# Patient Record
Sex: Male | Born: 1955
Health system: Southern US, Community
[De-identification: ages and names within clinical notes are randomized; demographics above are authoritative.]

## PROBLEM LIST (undated history)

## (undated) DIAGNOSIS — K635 Polyp of colon: Secondary | ICD-10-CM

## (undated) DIAGNOSIS — E7800 Pure hypercholesterolemia, unspecified: Secondary | ICD-10-CM

## (undated) DIAGNOSIS — H269 Unspecified cataract: Secondary | ICD-10-CM

## (undated) DIAGNOSIS — F419 Anxiety disorder, unspecified: Secondary | ICD-10-CM

## (undated) DIAGNOSIS — K219 Gastro-esophageal reflux disease without esophagitis: Secondary | ICD-10-CM

## (undated) DIAGNOSIS — I10 Essential (primary) hypertension: Secondary | ICD-10-CM

## (undated) DIAGNOSIS — M810 Age-related osteoporosis without current pathological fracture: Secondary | ICD-10-CM

## (undated) DIAGNOSIS — S2249XA Multiple fractures of ribs, unspecified side, initial encounter for closed fracture: Secondary | ICD-10-CM

## (undated) DIAGNOSIS — E039 Hypothyroidism, unspecified: Secondary | ICD-10-CM

## (undated) DIAGNOSIS — F1910 Other psychoactive substance abuse, uncomplicated: Secondary | ICD-10-CM

## (undated) DIAGNOSIS — J439 Emphysema, unspecified: Secondary | ICD-10-CM

## (undated) DIAGNOSIS — E119 Type 2 diabetes mellitus without complications: Secondary | ICD-10-CM

## (undated) DIAGNOSIS — R590 Localized enlarged lymph nodes: Secondary | ICD-10-CM

## (undated) DIAGNOSIS — M1990 Unspecified osteoarthritis, unspecified site: Secondary | ICD-10-CM

## (undated) DIAGNOSIS — J449 Chronic obstructive pulmonary disease, unspecified: Secondary | ICD-10-CM

## (undated) HISTORY — PX: FIXATION KYPHOPLASTY LUMBAR SPINE: SHX1642

## (undated) HISTORY — DX: Gastro-esophageal reflux disease without esophagitis: K21.9

## (undated) HISTORY — DX: Essential (primary) hypertension: I10

## (undated) HISTORY — DX: Type 2 diabetes mellitus without complications: E11.9

## (undated) HISTORY — DX: Polyp of colon: K63.5

## (undated) HISTORY — PX: BACK SURGERY: SHX140

## (undated) HISTORY — PX: OTHER SURGICAL HISTORY: SHX169

## (undated) HISTORY — DX: Localized enlarged lymph nodes: R59.0

## (undated) HISTORY — DX: Pure hypercholesterolemia, unspecified: E78.00

## (undated) HISTORY — DX: Emphysema, unspecified: J43.9

## (undated) HISTORY — DX: Chronic obstructive pulmonary disease, unspecified: J44.9

## (undated) HISTORY — PX: WISDOM TOOTH EXTRACTION: SHX21

## (undated) HISTORY — DX: Age-related osteoporosis without current pathological fracture: M81.0

## (undated) HISTORY — DX: Anxiety disorder, unspecified: F41.9

## (undated) HISTORY — DX: Unspecified cataract: H26.9

## (undated) HISTORY — DX: Other psychoactive substance abuse, uncomplicated: F19.10

---

## 2002-09-10 ENCOUNTER — Encounter: Payer: Self-pay | Admitting: Orthopedic Surgery

## 2002-09-10 ENCOUNTER — Encounter: Admission: RE | Admit: 2002-09-10 | Discharge: 2002-09-10 | Payer: Self-pay | Admitting: Orthopedic Surgery

## 2007-01-20 ENCOUNTER — Ambulatory Visit (HOSPITAL_COMMUNITY): Admission: RE | Admit: 2007-01-20 | Discharge: 2007-01-20 | Payer: Self-pay | Admitting: Physician Assistant

## 2007-01-20 DIAGNOSIS — D229 Melanocytic nevi, unspecified: Secondary | ICD-10-CM

## 2007-01-20 HISTORY — DX: Melanocytic nevi, unspecified: D22.9

## 2007-12-23 ENCOUNTER — Ambulatory Visit (HOSPITAL_COMMUNITY): Admission: RE | Admit: 2007-12-23 | Discharge: 2007-12-23 | Payer: Self-pay | Admitting: Family Medicine

## 2008-06-29 ENCOUNTER — Encounter: Admission: RE | Admit: 2008-06-29 | Discharge: 2008-06-29 | Payer: Self-pay | Admitting: Family Medicine

## 2008-07-13 ENCOUNTER — Ambulatory Visit (HOSPITAL_COMMUNITY): Admission: RE | Admit: 2008-07-13 | Discharge: 2008-07-13 | Payer: Self-pay | Admitting: Family Medicine

## 2010-01-14 ENCOUNTER — Encounter: Admission: RE | Admit: 2010-01-14 | Discharge: 2010-01-14 | Payer: Self-pay | Admitting: Family Medicine

## 2010-04-07 ENCOUNTER — Encounter: Admission: RE | Admit: 2010-04-07 | Discharge: 2010-04-07 | Payer: Self-pay | Admitting: Family Medicine

## 2010-05-14 HISTORY — PX: KNEE ARTHROSCOPY: SHX127

## 2010-06-21 ENCOUNTER — Ambulatory Visit: Payer: 59 | Attending: Orthopedic Surgery | Admitting: Physical Therapy

## 2010-06-21 DIAGNOSIS — M6281 Muscle weakness (generalized): Secondary | ICD-10-CM | POA: Insufficient documentation

## 2010-06-21 DIAGNOSIS — R5381 Other malaise: Secondary | ICD-10-CM | POA: Insufficient documentation

## 2010-06-21 DIAGNOSIS — IMO0001 Reserved for inherently not codable concepts without codable children: Secondary | ICD-10-CM | POA: Insufficient documentation

## 2010-06-21 DIAGNOSIS — M2550 Pain in unspecified joint: Secondary | ICD-10-CM | POA: Insufficient documentation

## 2010-06-21 DIAGNOSIS — R262 Difficulty in walking, not elsewhere classified: Secondary | ICD-10-CM | POA: Insufficient documentation

## 2010-06-27 ENCOUNTER — Ambulatory Visit: Payer: 59 | Admitting: *Deleted

## 2010-06-29 ENCOUNTER — Ambulatory Visit: Payer: 59 | Admitting: *Deleted

## 2010-07-04 ENCOUNTER — Ambulatory Visit: Payer: 59 | Admitting: *Deleted

## 2010-07-07 ENCOUNTER — Ambulatory Visit: Payer: 59 | Admitting: Physical Therapy

## 2010-07-11 ENCOUNTER — Ambulatory Visit: Payer: 59 | Admitting: Physical Therapy

## 2010-07-13 ENCOUNTER — Ambulatory Visit: Payer: 59 | Attending: Orthopedic Surgery | Admitting: Physical Therapy

## 2010-07-13 DIAGNOSIS — IMO0001 Reserved for inherently not codable concepts without codable children: Secondary | ICD-10-CM | POA: Insufficient documentation

## 2010-07-13 DIAGNOSIS — M2550 Pain in unspecified joint: Secondary | ICD-10-CM | POA: Insufficient documentation

## 2010-07-13 DIAGNOSIS — R262 Difficulty in walking, not elsewhere classified: Secondary | ICD-10-CM | POA: Insufficient documentation

## 2010-07-13 DIAGNOSIS — R5381 Other malaise: Secondary | ICD-10-CM | POA: Insufficient documentation

## 2010-07-13 DIAGNOSIS — M6281 Muscle weakness (generalized): Secondary | ICD-10-CM | POA: Insufficient documentation

## 2010-07-18 ENCOUNTER — Ambulatory Visit: Payer: 59 | Admitting: *Deleted

## 2010-07-20 ENCOUNTER — Ambulatory Visit: Payer: 59 | Admitting: *Deleted

## 2010-07-27 ENCOUNTER — Ambulatory Visit: Payer: 59 | Admitting: Physical Therapy

## 2010-08-01 ENCOUNTER — Ambulatory Visit: Payer: 59 | Admitting: Physical Therapy

## 2010-08-03 ENCOUNTER — Ambulatory Visit: Payer: 59 | Admitting: Physical Therapy

## 2010-10-12 ENCOUNTER — Other Ambulatory Visit: Payer: Self-pay | Admitting: Family Medicine

## 2010-10-12 DIAGNOSIS — S32000A Wedge compression fracture of unspecified lumbar vertebra, initial encounter for closed fracture: Secondary | ICD-10-CM

## 2010-10-12 DIAGNOSIS — IMO0002 Reserved for concepts with insufficient information to code with codable children: Secondary | ICD-10-CM

## 2010-10-14 ENCOUNTER — Ambulatory Visit
Admission: RE | Admit: 2010-10-14 | Discharge: 2010-10-14 | Disposition: A | Discharge status: Home / Self Care | Payer: 59 | Source: Ambulatory Visit | Attending: Family Medicine | Admitting: Family Medicine

## 2010-10-14 DIAGNOSIS — S32000A Wedge compression fracture of unspecified lumbar vertebra, initial encounter for closed fracture: Secondary | ICD-10-CM

## 2010-10-17 ENCOUNTER — Ambulatory Visit (HOSPITAL_COMMUNITY)
Admission: RE | Admit: 2010-10-17 | Discharge: 2010-10-17 | Disposition: A | Discharge status: Home / Self Care | Payer: 59 | Source: Ambulatory Visit | Attending: Family Medicine | Admitting: Family Medicine

## 2010-10-17 ENCOUNTER — Other Ambulatory Visit (HOSPITAL_COMMUNITY): Payer: Self-pay | Admitting: Interventional Radiology

## 2010-10-17 DIAGNOSIS — IMO0002 Reserved for concepts with insufficient information to code with codable children: Secondary | ICD-10-CM

## 2010-10-20 ENCOUNTER — Ambulatory Visit (HOSPITAL_COMMUNITY)
Admission: RE | Admit: 2010-10-20 | Discharge: 2010-10-20 | Disposition: A | Discharge status: Home / Self Care | Payer: 59 | Source: Ambulatory Visit | Attending: Interventional Radiology | Admitting: Interventional Radiology

## 2010-10-20 ENCOUNTER — Other Ambulatory Visit: Payer: Self-pay | Admitting: Interventional Radiology

## 2010-10-20 DIAGNOSIS — E039 Hypothyroidism, unspecified: Secondary | ICD-10-CM | POA: Insufficient documentation

## 2010-10-20 DIAGNOSIS — M1990 Unspecified osteoarthritis, unspecified site: Secondary | ICD-10-CM | POA: Insufficient documentation

## 2010-10-20 DIAGNOSIS — J4489 Other specified chronic obstructive pulmonary disease: Secondary | ICD-10-CM | POA: Insufficient documentation

## 2010-10-20 DIAGNOSIS — F17200 Nicotine dependence, unspecified, uncomplicated: Secondary | ICD-10-CM | POA: Insufficient documentation

## 2010-10-20 DIAGNOSIS — G8929 Other chronic pain: Secondary | ICD-10-CM | POA: Insufficient documentation

## 2010-10-20 DIAGNOSIS — M8448XA Pathological fracture, other site, initial encounter for fracture: Secondary | ICD-10-CM | POA: Insufficient documentation

## 2010-10-20 DIAGNOSIS — J449 Chronic obstructive pulmonary disease, unspecified: Secondary | ICD-10-CM | POA: Insufficient documentation

## 2010-10-20 DIAGNOSIS — Z01812 Encounter for preprocedural laboratory examination: Secondary | ICD-10-CM | POA: Insufficient documentation

## 2010-10-20 DIAGNOSIS — IMO0002 Reserved for concepts with insufficient information to code with codable children: Secondary | ICD-10-CM

## 2010-10-20 DIAGNOSIS — E785 Hyperlipidemia, unspecified: Secondary | ICD-10-CM | POA: Insufficient documentation

## 2010-10-20 LAB — PROTIME-INR
INR: 0.98 (ref 0.00–1.49)
Prothrombin Time: 13.2 seconds (ref 11.6–15.2)

## 2010-10-20 LAB — POCT I-STAT, CHEM 8
Chloride: 107 mEq/L (ref 96–112)
Creatinine, Ser: 1 mg/dL (ref 0.4–1.5)
HCT: 48 % (ref 39.0–52.0)
Hemoglobin: 16.3 g/dL (ref 13.0–17.0)
Potassium: 4.1 mEq/L (ref 3.5–5.1)
Sodium: 138 mEq/L (ref 135–145)

## 2010-10-20 LAB — CBC
HCT: 44.7 % (ref 39.0–52.0)
Hemoglobin: 16.3 g/dL (ref 13.0–17.0)
MCH: 35.5 pg — ABNORMAL HIGH (ref 26.0–34.0)
RBC: 4.59 MIL/uL (ref 4.22–5.81)

## 2010-10-27 ENCOUNTER — Other Ambulatory Visit (HOSPITAL_COMMUNITY): Payer: Self-pay | Admitting: Interventional Radiology

## 2010-10-27 DIAGNOSIS — IMO0002 Reserved for concepts with insufficient information to code with codable children: Secondary | ICD-10-CM

## 2010-11-03 ENCOUNTER — Ambulatory Visit (HOSPITAL_COMMUNITY)
Admission: RE | Admit: 2010-11-03 | Discharge: 2010-11-03 | Disposition: A | Discharge status: Home / Self Care | Payer: 59 | Source: Ambulatory Visit | Attending: Interventional Radiology | Admitting: Interventional Radiology

## 2010-11-03 DIAGNOSIS — IMO0002 Reserved for concepts with insufficient information to code with codable children: Secondary | ICD-10-CM

## 2011-04-03 ENCOUNTER — Ambulatory Visit (HOSPITAL_COMMUNITY)
Admission: RE | Admit: 2011-04-03 | Discharge: 2011-04-03 | Disposition: A | Discharge status: Home / Self Care | Payer: 59 | Source: Ambulatory Visit | Attending: Family Medicine | Admitting: Family Medicine

## 2011-04-03 ENCOUNTER — Other Ambulatory Visit: Payer: Self-pay | Admitting: Family Medicine

## 2011-04-03 DIAGNOSIS — R599 Enlarged lymph nodes, unspecified: Secondary | ICD-10-CM | POA: Insufficient documentation

## 2011-04-03 DIAGNOSIS — R918 Other nonspecific abnormal finding of lung field: Secondary | ICD-10-CM

## 2011-04-03 DIAGNOSIS — F17200 Nicotine dependence, unspecified, uncomplicated: Secondary | ICD-10-CM | POA: Insufficient documentation

## 2011-04-03 DIAGNOSIS — Z77090 Contact with and (suspected) exposure to asbestos: Secondary | ICD-10-CM | POA: Insufficient documentation

## 2011-04-03 DIAGNOSIS — J438 Other emphysema: Secondary | ICD-10-CM | POA: Insufficient documentation

## 2011-04-12 ENCOUNTER — Other Ambulatory Visit: Payer: Self-pay | Admitting: Critical Care Medicine

## 2011-04-12 ENCOUNTER — Ambulatory Visit (INDEPENDENT_AMBULATORY_CARE_PROVIDER_SITE_OTHER): Payer: 59 | Admitting: Critical Care Medicine

## 2011-04-12 ENCOUNTER — Encounter: Payer: Self-pay | Admitting: Critical Care Medicine

## 2011-04-12 DIAGNOSIS — R599 Enlarged lymph nodes, unspecified: Secondary | ICD-10-CM

## 2011-04-12 DIAGNOSIS — J449 Chronic obstructive pulmonary disease, unspecified: Secondary | ICD-10-CM | POA: Insufficient documentation

## 2011-04-12 DIAGNOSIS — R590 Localized enlarged lymph nodes: Secondary | ICD-10-CM | POA: Insufficient documentation

## 2011-04-12 DIAGNOSIS — J45909 Unspecified asthma, uncomplicated: Secondary | ICD-10-CM

## 2011-04-12 DIAGNOSIS — F17200 Nicotine dependence, unspecified, uncomplicated: Secondary | ICD-10-CM

## 2011-04-12 DIAGNOSIS — I10 Essential (primary) hypertension: Secondary | ICD-10-CM | POA: Insufficient documentation

## 2011-04-12 DIAGNOSIS — E7800 Pure hypercholesterolemia, unspecified: Secondary | ICD-10-CM | POA: Insufficient documentation

## 2011-04-12 MED ORDER — NICOTINE 10 MG IN INHA: RESPIRATORY_TRACT | Status: AC

## 2011-04-12 MED ORDER — NICOTINE 10 MG IN INHA: RESPIRATORY_TRACT | Status: DC

## 2011-04-12 NOTE — Patient Instructions (Signed)
Try Nicotrol inhaler to stop smoking  Stay on Advair twice daily A Repeat CT Chest will be obtained at [facility] in 3 months Return 3 months after CT Chest obtained

## 2011-04-12 NOTE — Assessment & Plan Note (Signed)
Asthmatic bronchitis with ongoing tobacco use FeV1 49% Fef 25 75 19% 03/2011  Plan Cont advair STop smoking

## 2011-04-12 NOTE — Progress Notes (Signed)
Subjective:    Patient ID: Frank Rosales, male    DOB: [birth year], 55 y.o.   MRN: [id number]  HPI 55 y.o.M Had a company physical and needed f/u.   Had hilar adenopathy on CT Chest Pt notes prior CXR had abn at that time.   Notes a cough from smoking.  Not that dypsneic.  Notes some wheeze if does not take advair. No real chest pain.  No weight loss.   On Advair for two years,  Not using regularly. No fever,  Notes some nocturnal night sweats.    Mucus now is green to clear.   No hemoptysis. No real dyspnea.  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 No real edema.  Smokes 1-1.5PPD. Was 2PPD. Tried Chantix three times.    Past Medical History  Diagnosis Date  . High blood pressure   . Asthma   . High cholesterol   . Hilar adenopathy      Family History  Problem Relation Age of Onset  . Emphysema Father   . Heart disease Brother   . Heart failure Mother   . Lung cancer Father      History   Social History  . Marital Status: Married    Spouse Name: N/A    Number of Children: 1  . 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 Smoking status: Current Everyday Smoker -- 35 years    Types: Cigarettes  . Smokeless tobacco: Not on file   Comment: 1-2 packs per day  . Alcohol Use: Yes     6 beers per day  . Drug Use: No  . Sexually Active: Not on file   Other Topics Concern  . Not on file   Social History Narrative  . No narrative on file     No Known Allergies   No outpatient prescriptions prior to visit.      Review of Systems  Constitutional: Negative for fever, chills, diaphoresis, activity change, appetite change, fatigue and unexpected weight change.  HENT: Positive for congestion, rhinorrhea and dental problem. Negative for hearing loss, ear pain, nosebleeds, sore throat, facial swelling, sneezing, mouth sores, trouble swallowing, neck pain, neck  stiffness, voice change, postnasal drip, sinus pressure, tinnitus and ear discharge.   Eyes: Negative for photophobia, discharge, itching and visual disturbance.  Respiratory: Positive for cough, choking and wheezing. Negative for apnea, chest tightness, shortness of breath and stridor.   Cardiovascular: Negative for chest pain, palpitations and leg swelling.  Gastrointestinal: Negative for nausea, vomiting, abdominal pain, constipation, blood in stool and abdominal distention.  Genitourinary: Negative for dysuria, urgency, frequency, hematuria, flank pain, decreased urine volume and difficulty urinating.  Musculoskeletal: Positive for back pain and joint swelling. Negative for myalgias, arthralgias and gait problem.  Skin: Negative for color change, pallor and rash.  Neurological: Negative for dizziness, tremors, seizures, syncope, speech difficulty, weakness, light-headedness, numbness and headaches.  Hematological: Negative for adenopathy. Does not bruise/bleed easily.  Psychiatric/Behavioral: Negative for confusion, sleep disturbance and agitation. The patient is nervous/anxious.        Objective:   Physical Exam  Filed Vitals:   04/12/11 1046  BP: 142/90  Pulse: 102  Temp: 98.5 F (36.9 C)  TempSrc: Oral  Height: 5\' 10"  (1.778 m)  Weight: 180 lb (81.647 kg)  SpO2: 94%    Gen: Pleasant, well-nourished, in no distress,  normal affect  ENT: No lesions,  mouth clear,  oropharynx clear, no postnasal drip  Neck: No JVD, no TMG, no carotid bruits  Lungs: No use of accessory muscles, no dullness to percussion, distant BS, scattered exp wheeze  Cardiovascular: RRR, heart sounds normal, no murmur or gallops, no peripheral edema  Abdomen: soft and NT, no HSM,  BS normal  Musculoskeletal: No deformities, no cyanosis or clubbing  Neuro: alert, non focal  Skin: Warm, no lesions or rashes  CT Chest 04/03/11 IMPRESSION:  1. Mild right hilar adenopathy, etiology uncertain.  2.  Lingular subsegmental atelectasis or scarring.  3. Paraseptal emphysema.  4. Old left rib fractures        Assessment & Plan:   Hilar adenopathy Minimal hilar adenopathy not consistent with CA Plan Watch with repeat CT Chest in 3 months at [facility] F/u in office after next CT is obtained Focus on smoking cessation:  smoking cessation counseling given  Obstructive chronic bronchitis without exacerbation Asthmatic bronchitis with ongoing tobacco use FeV1 49% Fef 25 75 19% 03/2011  Plan Cont advair STop smoking      Updated Medication List Outpatient Encounter Prescriptions as of 04/12/2011  Medication Sig Dispense Refill  . ADVAIR DISKUS 250-50 MCG/DOSE AEPB 1 puff 2 (two) times daily.       Marland Kitchen ALPRAZolam (XANAX) 0.5 MG tablet as needed.      Marland Kitchen aspirin 81 MG tablet Take 81 mg by mouth daily.        Marland Kitchen CHERRY PO Take by mouth. BLACK CHERRY EXTRACT       . Cholecalciferol (VITAMIN D-3 PO) Take by mouth daily.        . Cyanocobalamin (B-12 PO) Take by mouth daily.        Marland Kitchen DIOVAN HCT 320-25 MG per tablet Take 1 tablet by mouth daily.       . fish oil-omega-3 fatty acids 1000 MG capsule Take 2 g by mouth daily. 3 caps daily       . FOLIC ACID PO Take by mouth.        . magnesium 30 MG tablet Take 30 mg by mouth daily.        Marland Kitchen NIFEdipine (PROCARDIA XL/ADALAT-CC) 90 MG 24 hr tablet Take 90 mg by mouth daily.       . pravastatin (PRAVACHOL) 40 MG tablet Take by mouth At bedtime.      Marland Kitchen SYNTHROID 25 MCG tablet Take 25 mcg by mouth daily.       . nicotine (NICOTROL) 10 MG inhaler 60 - 80 puffs per cartridge  6-8 cartridges per day  168 each  2  . DISCONTD: nicotine (NICOTROL) 10 MG inhaler 60 - 80 puffs per cartridge  6-8 cartridges per day  168 each  2

## 2011-04-12 NOTE — Assessment & Plan Note (Signed)
Minimal hilar adenopathy not consistent with CA Plan Watch with repeat CT Chest in 3 months at [facility] F/u in office after next CT is obtained Focus on smoking cessation:  smoking cessation counseling given

## 2011-05-03 ENCOUNTER — Other Ambulatory Visit: Payer: Self-pay | Admitting: Dermatology

## 2011-07-05 ENCOUNTER — Other Ambulatory Visit: Payer: Self-pay | Admitting: Critical Care Medicine

## 2011-07-05 ENCOUNTER — Other Ambulatory Visit: Payer: Self-pay | Admitting: *Deleted

## 2011-07-05 DIAGNOSIS — E7800 Pure hypercholesterolemia, unspecified: Secondary | ICD-10-CM

## 2011-07-05 LAB — BASIC METABOLIC PANEL
Potassium: 4.1 mEq/L (ref 3.5–5.3)
Sodium: 138 mEq/L (ref 135–145)

## 2011-07-12 ENCOUNTER — Telehealth: Payer: Self-pay | Admitting: Critical Care Medicine

## 2011-07-12 ENCOUNTER — Ambulatory Visit (HOSPITAL_COMMUNITY)
Admission: RE | Admit: 2011-07-12 | Discharge: 2011-07-12 | Disposition: A | Discharge status: Home / Self Care | Payer: 59 | Source: Ambulatory Visit | Attending: Critical Care Medicine | Admitting: Critical Care Medicine

## 2011-07-12 DIAGNOSIS — R599 Enlarged lymph nodes, unspecified: Secondary | ICD-10-CM | POA: Insufficient documentation

## 2011-07-12 DIAGNOSIS — R590 Localized enlarged lymph nodes: Secondary | ICD-10-CM

## 2011-07-12 DIAGNOSIS — R918 Other nonspecific abnormal finding of lung field: Secondary | ICD-10-CM | POA: Insufficient documentation

## 2011-07-12 DIAGNOSIS — Z720 Tobacco use: Secondary | ICD-10-CM

## 2011-07-12 DIAGNOSIS — F17210 Nicotine dependence, cigarettes, uncomplicated: Secondary | ICD-10-CM | POA: Insufficient documentation

## 2011-07-12 MED ORDER — NICOTINE 10 MG IN INHA: RESPIRATORY_TRACT | Status: DC

## 2011-07-12 MED ORDER — IOHEXOL 300 MG/ML  SOLN
100.0000 mL | Freq: Once | INTRAMUSCULAR | Status: AC | PRN
  Administered 2011-07-12: 100 mL via INTRAVENOUS

## 2011-07-12 NOTE — Telephone Encounter (Signed)
Called pt and gave him CT scan result HE is still smoking He is to keep 07/2011 Appt Will Rx nicotrol

## 2011-07-23 ENCOUNTER — Encounter: Payer: Self-pay | Admitting: Critical Care Medicine

## 2011-07-23 ENCOUNTER — Ambulatory Visit (INDEPENDENT_AMBULATORY_CARE_PROVIDER_SITE_OTHER): Payer: 59 | Admitting: Critical Care Medicine

## 2011-07-23 DIAGNOSIS — J449 Chronic obstructive pulmonary disease, unspecified: Secondary | ICD-10-CM

## 2011-07-23 DIAGNOSIS — F17200 Nicotine dependence, unspecified, uncomplicated: Secondary | ICD-10-CM

## 2011-07-23 DIAGNOSIS — R599 Enlarged lymph nodes, unspecified: Secondary | ICD-10-CM

## 2011-07-23 DIAGNOSIS — R590 Localized enlarged lymph nodes: Secondary | ICD-10-CM

## 2011-07-23 DIAGNOSIS — Z720 Tobacco use: Secondary | ICD-10-CM

## 2011-07-23 NOTE — Assessment & Plan Note (Signed)
Focus on smoking cessation with nicotrol

## 2011-07-23 NOTE — Patient Instructions (Signed)
Stay on Advair twice daily Focus on smoking cessation with nicotrol Return 6 months

## 2011-07-23 NOTE — Assessment & Plan Note (Signed)
Asthmatic bronchitis improved  Need to focus on smoking cessation Plan Cont advair Focus on smoking cessation with nicotrol

## 2011-07-23 NOTE — Progress Notes (Signed)
Subjective:    Patient ID: Frank Rosales, male    DOB: 20-Sep-1955, 56 y.o.   MRN: [id number]  HPI  56 y.o.M Had a company physical and needed f/u.   Had hilar adenopathy on CT Chest Pt notes prior CXR had abn at that time.   Notes a cough from smoking.  Not that dypsneic.  Notes some wheeze if does not take advair. No real chest pain.  No weight loss.   On Advair for two years,  Not using regularly. No fever,  Notes some nocturnal night sweats.    Mucus now is green to clear.   No hemoptysis. No real dyspnea.  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 No real edema.  Smokes 1-1.5PPD. Was 2PPD. Tried Chantix three times.    07/23/2011 Still smoking 1PPD. On advair.  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    Past Medical History  Diagnosis Date  . High blood pressure   . Asthma   . High cholesterol   . Hilar adenopathy      Family History  Problem Relation Age of Onset  . Emphysema Father   . Heart disease Brother   . Heart failure Mother   . Lung cancer Father      History   Social History  . Marital Status: Married    Spouse Name: N/A    Number of Children: 1  . 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 Smoking status: Current Everyday Smoker -- 0.5 packs/day for 35 years    Types: Cigarettes  . Smokeless tobacco: Never Used  . Alcohol Use: Yes     6 beers per day  . Drug Use: No  . Sexually Active: Not on file   Other Topics Concern  . Not on file   Social History Narrative  . No narrative on file     No Known Allergies   Outpatient Prescriptions Prior to Visit  Medication Sig Dispense Refill  . ADVAIR DISKUS 250-50 MCG/DOSE AEPB 1 puff 2 (two) times daily.       Marland Kitchen ALPRAZolam (XANAX) 0.5 MG tablet as needed.      Marland Kitchen aspirin 81 MG tablet Take 81 mg by mouth daily.          Marland Kitchen CHERRY PO Take by mouth. BLACK CHERRY EXTRACT       . Cholecalciferol (VITAMIN D-3 PO) Take by mouth daily.        . Cyanocobalamin (B-12 PO) Take by mouth daily.        Marland Kitchen DIOVAN HCT 320-25 MG per tablet Take 1 tablet by mouth daily.       . fish oil-omega-3 fatty acids 1000 MG capsule Take 2 g by mouth daily. 3 caps daily       . FOLIC ACID PO Take by mouth.        . magnesium 30 MG tablet Take 30 mg by mouth daily.        Marland Kitchen NIFEdipine (PROCARDIA XL/ADALAT-CC) 90 MG 24 hr tablet Take 90 mg by mouth daily.       . pravastatin (PRAVACHOL) 40 MG tablet Take by mouth At bedtime.      Marland Kitchen SYNTHROID 25 MCG tablet Take 25 mcg by mouth daily.       Marland Kitchen  nicotine (NICOTROL) 10 MG inhaler 60 - 80 puffs per cartridge  6-8 cartridges per day  168 each  2      Review of Systems  Constitutional: Negative for fever, chills, diaphoresis, activity change, appetite change, fatigue and unexpected weight change.  HENT: Positive for congestion, rhinorrhea and dental problem. Negative for hearing loss, ear pain, nosebleeds, sore throat, facial swelling, sneezing, mouth sores, trouble swallowing, neck pain, neck stiffness, voice change, postnasal drip, sinus pressure, tinnitus and ear discharge.   Eyes: Negative for photophobia, discharge, itching and visual disturbance.  Respiratory: Positive for cough, choking and wheezing. Negative for apnea, chest tightness, shortness of breath and stridor.   Cardiovascular: Negative for chest pain, palpitations and leg swelling.  Gastrointestinal: Negative for nausea, vomiting, abdominal pain, constipation, blood in stool and abdominal distention.  Genitourinary: Negative for dysuria, urgency, frequency, hematuria, flank pain, decreased urine volume and difficulty urinating.  Musculoskeletal: Positive for back pain and joint swelling. Negative for myalgias, arthralgias and gait problem.  Skin: Negative for color change, pallor and rash.  Neurological: Negative for dizziness,  tremors, seizures, syncope, speech difficulty, weakness, light-headedness, numbness and headaches.  Hematological: Negative for adenopathy. Does not bruise/bleed easily.  Psychiatric/Behavioral: Negative for confusion, sleep disturbance and agitation. The patient is nervous/anxious.        Objective:   Physical Exam   Filed Vitals:   07/23/11 1149  BP: 128/80  Pulse: 85  Temp: 98.2 F (36.8 C)  TempSrc: Oral  Height: 5\' 10"  (1.778 m)  Weight: 185 lb (83.915 kg)  SpO2: 96%    Gen: Pleasant, well-nourished, in no distress,  normal affect  ENT: No lesions,  mouth clear,  oropharynx clear, no postnasal drip  Neck: No JVD, no TMG, no carotid bruits  Lungs: No use of accessory muscles, no dullness to percussion, distant BS, scattered exp wheeze  Cardiovascular: RRR, heart sounds normal, no murmur or gallops, no peripheral edema  Abdomen: soft and NT, no HSM,  BS normal  Musculoskeletal: No deformities, no cyanosis or clubbing  Neuro: alert, non focal  Skin: Warm, no lesions or rashes  CT Chest 07/12/11 IMPRESSION:  1. Mild right hilar adenopathy, etiology uncertain. >>>no change from 11/12 2. Lingular subsegmental atelectasis or scarring.  3. Paraseptal emphysema.  4. Old left rib fractures        Assessment & Plan:   Obstructive chronic bronchitis without exacerbation Asthmatic bronchitis improved  Need to focus on smoking cessation Plan Cont advair Focus on smoking cessation with nicotrol  Nicotine abuse Focus on smoking cessation with nicotrol  Hilar adenopathy 11/12 CT Chest: minimal R hilar adenopathy, bilat mild cystic changes, scar in L lingula 07/12/2011  CT Chest : Stable R hilar LAN no new changes  Stable R hilar LAN No need for additional CTs     Updated Medication List Outpatient Encounter Prescriptions as of 07/23/2011  Medication Sig Dispense Refill  . ADVAIR DISKUS 250-50 MCG/DOSE AEPB 1 puff 2 (two) times daily.       Marland Kitchen ALPRAZolam  (XANAX) 0.5 MG tablet as needed.      Marland Kitchen aspirin 81 MG tablet Take 81 mg by mouth daily.        Marland Kitchen CHERRY PO Take by mouth. BLACK CHERRY EXTRACT       . Cholecalciferol (VITAMIN D-3 PO) Take by mouth daily.        . Cyanocobalamin (B-12 PO) Take by mouth daily.        . diazepam (VALIUM) 5 MG tablet  as needed.      Marland Kitchen DIOVAN HCT 320-25 MG per tablet Take 1 tablet by mouth daily.       . fish oil-omega-3 fatty acids 1000 MG capsule Take 2 g by mouth daily. 3 caps daily       . FOLIC ACID PO Take by mouth.        . magnesium 30 MG tablet Take 30 mg by mouth daily.        Marland Kitchen NIFEdipine (PROCARDIA XL/ADALAT-CC) 90 MG 24 hr tablet Take 90 mg by mouth daily.       . pravastatin (PRAVACHOL) 40 MG tablet Take by mouth At bedtime.      Marland Kitchen SYNTHROID 25 MCG tablet Take 25 mcg by mouth daily.       . nicotine (NICOTROL) 10 MG inhaler 60 - 80 puffs per cartridge  6-8 cartridges per day Has not started yet      . DISCONTD: nicotine (NICOTROL) 10 MG inhaler 60 - 80 puffs per cartridge  6-8 cartridges per day  168 each  2

## 2011-07-23 NOTE — Assessment & Plan Note (Signed)
11/12 CT Chest: minimal R hilar adenopathy, bilat mild cystic changes, scar in L lingula 07/12/2011  CT Chest : Stable R hilar LAN no new changes  Stable R hilar LAN No need for additional CTs

## 2012-02-22 ENCOUNTER — Telehealth: Payer: Self-pay | Admitting: Critical Care Medicine

## 2012-02-22 DIAGNOSIS — R590 Localized enlarged lymph nodes: Secondary | ICD-10-CM

## 2012-02-22 NOTE — Telephone Encounter (Signed)
This pt needs a f/u CT Chest with contrast  For R hilar lymph node.    He also needs an OV soon

## 2012-02-22 NOTE — Telephone Encounter (Signed)
lmomtcb on both home and cell #s.  Will need to place order for CT after speaking with pt and schedule an OV soon.

## 2012-02-25 NOTE — Telephone Encounter (Signed)
lmomtcb on both home and cell #s 

## 2012-02-25 NOTE — Telephone Encounter (Signed)
Spoke with pt.  He is aware I have placed order for CT Chest.  He would like this scheduled this Thursday in [place].  He is aware order was placed and PCCs will call to schedule.

## 2012-02-26 NOTE — Telephone Encounter (Signed)
Pt scheduled appt yesterday for Oct 31 as this was the soonest day he could do d/t work issues. If CT can be done on Thursday, ? Can pt see PW afterwards, same day in [place]?  I have lmomtcb to ask pt this.

## 2012-02-26 NOTE — Telephone Encounter (Addendum)
Pt returned call and will do CT and then see dr Delford Field in [facility] office 02-28-12 @4 :30. Hazel Sams

## 2012-02-28 ENCOUNTER — Encounter: Payer: Self-pay | Admitting: Critical Care Medicine

## 2012-02-28 ENCOUNTER — Ambulatory Visit (INDEPENDENT_AMBULATORY_CARE_PROVIDER_SITE_OTHER): Payer: 59 | Admitting: Critical Care Medicine

## 2012-02-28 ENCOUNTER — Ambulatory Visit (HOSPITAL_BASED_OUTPATIENT_CLINIC_OR_DEPARTMENT_OTHER)
Admission: RE | Admit: 2012-02-28 | Discharge: 2012-02-28 | Disposition: A | Discharge status: Home / Self Care | Payer: 59 | Source: Ambulatory Visit | Attending: Critical Care Medicine | Admitting: Critical Care Medicine

## 2012-02-28 VITALS — BP 132/90 | HR 75 | Temp 98.1°F | Ht 69.5 in | Wt 184.0 lb

## 2012-02-28 DIAGNOSIS — R599 Enlarged lymph nodes, unspecified: Secondary | ICD-10-CM | POA: Insufficient documentation

## 2012-02-28 DIAGNOSIS — J449 Chronic obstructive pulmonary disease, unspecified: Secondary | ICD-10-CM

## 2012-02-28 DIAGNOSIS — J4489 Other specified chronic obstructive pulmonary disease: Secondary | ICD-10-CM

## 2012-02-28 DIAGNOSIS — F17200 Nicotine dependence, unspecified, uncomplicated: Secondary | ICD-10-CM

## 2012-02-28 DIAGNOSIS — J438 Other emphysema: Secondary | ICD-10-CM | POA: Insufficient documentation

## 2012-02-28 DIAGNOSIS — R590 Localized enlarged lymph nodes: Secondary | ICD-10-CM

## 2012-02-28 DIAGNOSIS — Z720 Tobacco use: Secondary | ICD-10-CM

## 2012-02-28 MED ORDER — PREDNISONE 10 MG PO TABS: ORAL_TABLET | ORAL | Status: DC

## 2012-02-28 MED ORDER — IOHEXOL 300 MG/ML  SOLN
100.0000 mL | Freq: Once | INTRAMUSCULAR | Status: AC | PRN
  Administered 2012-02-28: 100 mL via INTRAVENOUS

## 2012-02-28 MED ORDER — FLUTICASONE-SALMETEROL 250-50 MCG/DOSE IN AEPB: 1.0000 | INHALATION_SPRAY | Freq: Two times a day (BID) | RESPIRATORY_TRACT | Status: DC

## 2012-02-28 MED ORDER — AZITHROMYCIN 250 MG PO TABS: 250.0000 mg | ORAL_TABLET | Freq: Every day | ORAL | Status: DC

## 2012-02-28 NOTE — Assessment & Plan Note (Signed)
CT scan of 02/28/2012 reveals Right hilar adenopathy without significant change compared to prior CT scan in February 2013 No evidence on CT scanning for asbestos-related lung disease Plan Due to stability of the hilar adenopathy compared to 2008 no additional scans are necessary

## 2012-02-28 NOTE — Patient Instructions (Addendum)
Use E cigarettes to reduce smoking volume Increase Advair one puff twice a day, use this when you brush your teeth Azithromycin 250mg  Take two once then one daily until gone Prednisone 10mg  Take 4 for two days three for two days two for two days one for two days Return for recheck in 2 months

## 2012-02-28 NOTE — Progress Notes (Signed)
Subjective:    Patient ID: Frank Rosales, male    DOB: 12-25-1955, 56 y.o.   MRN: [id number]  HPI  56 y.o.M   06/2011 Had a company physical and needed f/u.   Had hilar adenopathy on CT Chest Pt notes prior CXR had abn at that time.   Notes a cough from smoking.  Not that dypsneic.  Notes some wheeze if does not take advair. No real chest pain.  No weight loss.   On Advair for two years,  Not using regularly. No fever,  Notes some nocturnal night sweats.    Mucus now is green to clear.   No hemoptysis. No real dyspnea.  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 No real edema.  Smokes 1-1.5PPD. Was 2PPD. Tried Chantix three times.    07/23/2011 Still smoking 1PPD. On advair.  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     02/28/2012 Not seen since 3/13. Here for f/u CT Chest with exposure hx and R sub hilar LAN. Pt had company asbestos evaluation.  PCP wanted another eval Not wearing a respirator now. No tearing out the asbestos .  Now gets asbestos in packing and gaskets and packing.   Still coughs up yellow green daily.  Pt still smokes.  AM is worse and gets better as day goes on. Dyspnea better as mucus is out. Pt is still welding. Still smoking 1PPD.  Tried nicotrol: helps some .  Past Medical History  Diagnosis Date  . High blood pressure   . Asthma   . High cholesterol   . Hilar adenopathy      Family History  Problem Relation Age of Onset  . Emphysema Father   . Heart disease Brother   . Heart failure Mother   . Lung cancer Father      History   Social History  . Marital Status: Married    Spouse Name: N/A    Number of Children: 1  . 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 Smoking status: Current Every Day Smoker -- 1.0 packs/day    Types: Cigarettes  . Smokeless tobacco:  Never Used   Comment: Started smoking at age 49.    Marland Kitchen Alcohol Use: Yes     6 beers per day  . Drug Use: No  . Sexually Active: Not on file   Other Topics Concern  . Not on file   Social History Narrative  . No narrative on file     No Known Allergies   Outpatient Prescriptions Prior to Visit  Medication Sig Dispense Refill  . ALPRAZolam (XANAX) 0.5 MG tablet as needed.      Marland Kitchen aspirin 81 MG tablet Take 81 mg by mouth daily.        Marland Kitchen CHERRY PO Take by mouth 2 (two) times daily. BLACK CHERRY EXTRACT      . Cyanocobalamin (B-12 PO) Take by mouth daily.        . diazepam (VALIUM) 5 MG tablet as needed.      Marland Kitchen DIOVAN HCT 320-25 MG per tablet Take 1 tablet by mouth daily.       . fish oil-omega-3 fatty acids 1000 MG capsule Take by mouth. Omega Red daily      . FOLIC ACID PO Take by  mouth daily.       . magnesium 30 MG tablet Take 30 mg by mouth daily.        Marland Kitchen NIFEdipine (PROCARDIA XL/ADALAT-CC) 90 MG 24 hr tablet Take 90 mg by mouth daily.       Marland Kitchen SYNTHROID 25 MCG tablet Take 25 mcg by mouth daily.       Marland Kitchen ADVAIR DISKUS 250-50 MCG/DOSE AEPB 1 puff daily.       . Cholecalciferol (VITAMIN D-3 PO) Take by mouth daily.        . pravastatin (PRAVACHOL) 40 MG tablet Take by mouth At bedtime.       No facility-administered medications prior to visit.      Review of Systems  Constitutional: Negative for fever, chills, diaphoresis, activity change, appetite change, fatigue and unexpected weight change.  HENT: Positive for congestion, rhinorrhea and dental problem. Negative for hearing loss, ear pain, nosebleeds, sore throat, facial swelling, sneezing, mouth sores, trouble swallowing, neck pain, neck stiffness, voice change, postnasal drip, sinus pressure, tinnitus and ear discharge.   Eyes: Negative for photophobia, discharge, itching and visual disturbance.  Respiratory: Positive for cough, choking and wheezing. Negative for apnea, chest tightness, shortness of breath and stridor.     Cardiovascular: Negative for chest pain, palpitations and leg swelling.  Gastrointestinal: Negative for nausea, vomiting, abdominal pain, constipation, blood in stool and abdominal distention.  Genitourinary: Negative for dysuria, urgency, frequency, hematuria, flank pain, decreased urine volume and difficulty urinating.  Musculoskeletal: Positive for back pain and joint swelling. Negative for myalgias, arthralgias and gait problem.  Skin: Negative for color change, pallor and rash.  Neurological: Negative for dizziness, tremors, seizures, syncope, speech difficulty, weakness, light-headedness, numbness and headaches.  Hematological: Negative for adenopathy. Does not bruise/bleed easily.  Psychiatric/Behavioral: Negative for confusion, disturbed wake/sleep cycle and agitation. The patient is nervous/anxious.        Objective:   Physical Exam   Filed Vitals:   02/28/12 1616  BP: 132/90  Pulse: 75  Temp: 98.1 F (36.7 C)  TempSrc: Oral  Height: 5' 9.5" (1.765 m)  Weight: 184 lb (83.462 kg)  SpO2: 97%    Gen: Pleasant, well-nourished, in no distress,  normal affect  ENT: No lesions,  mouth clear,  oropharynx clear, no postnasal drip  Neck: No JVD, no TMG, no carotid bruits  Lungs: No use of accessory muscles, no dullness to percussion, distant BS, scattered exp wheeze, course rhonchi  Cardiovascular: RRR, heart sounds normal, no murmur or gallops, no peripheral edema  Abdomen: soft and NT, no HSM,  BS normal  Musculoskeletal: No deformities, no cyanosis or clubbing  Neuro: alert, non focal  Skin: Warm, no lesions or rashes  CT Chest 07/12/11 IMPRESSION:  1. Mild right hilar adenopathy, etiology uncertain. >>>no change from 11/12 2. Lingular subsegmental atelectasis or scarring.  3. Paraseptal emphysema.  4. Old left rib fractures   Ct Chest W Contrast  02/28/2012  *RADIOLOGY REPORT*  Clinical Data: Right hilar lymph node.  CT CHEST WITH CONTRAST  Technique:   Multidetector CT imaging of the chest was performed following the standard protocol during bolus administration of intravenous contrast.  Contrast: OMNIPAQUE IOHEXOL 300 MG/ML  SOLN 100 ml of Omnipaque-300.  Comparison: Chest CT 07/12/2011.  Findings:  Mediastinum: Heart size is normal. There is no significant pericardial fluid, thickening or pericardial calcification.  Again noted is a prominent nodal tissue in the right hilar region measuring approximately 1.4 x 1.5 cm on today's examination.  It  is uncertain whether not this represents one single enlarged lymph node, or a cluster of smaller lymph nodes.  No other definite pathologically enlarged mediastinal or left hilar lymph nodes are noted.  Esophagus is unremarkable in appearance.  Lungs/Pleura: There is a small area of scarring in the inferior segment of the lingula.  A background of mild centrilobular and paraseptal emphysema, most pronounced in the lung apices where there are multiple small bullae and blebs.  No acute consolidative airspace disease.  No pleural effusions.  No definite suspicious appearing pulmonary nodules or masses are identified.  Upper Abdomen: Unremarkable.  Musculoskeletal: There are no aggressive appearing lytic or blastic lesions noted in the visualized portions of the skeleton. Old compression fracture at L1 with approximately 50% loss of anterior vertebral body height and post vertebroplasty changes is similar to the prior examination.  IMPRESSION: 1.  Persistent prominence of lymphatic tissue in the right hilar region, slightly larger than prior examinations.  However, this is highly nonspecific.  Given the relative stability in appearance over time, this is favored to be a benign finding. 2.  Mild centrilobular and paraseptal emphysema. 3.  Old compression fracture of L1 again noted, as above.   Original Report Authenticated By: Florencia Reasons, M.D.    02/28/2012 Cleda Daub: FeV1 45%  (10% less than prior values 2012)      Assessment & Plan:   Gold stage C. COPD with ongoing tobacco use and exacerbation Asthmatic bronchitis with early chronic obstructive lung disease increased lower airway inflammation due to ongoing cigarette use Plan Use E cigarettes and Nicorette minis to reduce smoking volume Increase Advair one puff twice a day Azithromycin 250mg  Take two once then one daily until gone Prednisone 10mg  Take 4 for two days three for two days two for two days one for two days Note patient will receive flu vaccine at his place of work in the next week Return for recheck in 2 months   Hilar adenopathy CT scan of 02/28/2012 reveals Right hilar adenopathy without significant change compared to prior CT scan in February 2013 No evidence on CT scanning for asbestos-related lung disease Plan Due to stability of the hilar adenopathy compared to 2008 no additional scans are necessary   Nicotine abuse Ongoing nicotine use exacerbating lung disease I spent greater than 10 minutes reviewing smoking cessation counseling with this patient using feedback techniques Plan The patient appear to be more engaged today and willing to give another try at smoking cessation We will use E. cigarettes in a tapering dose along with Nicorette minis 4 mg Reeder 4 times daily    Updated Medication List Outpatient Encounter Prescriptions as of 02/28/2012  Medication Sig Dispense Refill  . ALPRAZolam (XANAX) 0.5 MG tablet as needed.      Marland Kitchen aspirin 81 MG tablet Take 81 mg by mouth daily.        Marland Kitchen CHERRY PO Take by mouth 2 (two) times daily. BLACK CHERRY EXTRACT      . Cyanocobalamin (B-12 PO) Take by mouth daily.        . diazepam (VALIUM) 5 MG tablet as needed.      Marland Kitchen DIOVAN HCT 320-25 MG per tablet Take 1 tablet by mouth daily.       . fish oil-omega-3 fatty acids 1000 MG capsule Take by mouth. Omega Red daily      . Fluticasone-Salmeterol (ADVAIR DISKUS) 250-50 MCG/DOSE AEPB Inhale 1 puff into the lungs 2 (two) times  daily.  60 each  6  . FOLIC ACID PO Take by mouth daily.       . magnesium 30 MG tablet Take 30 mg by mouth daily.        Marland Kitchen NIFEdipine (PROCARDIA XL/ADALAT-CC) 90 MG 24 hr tablet Take 90 mg by mouth daily.       Marland Kitchen SYNTHROID 25 MCG tablet Take 25 mcg by mouth daily.       Marland Kitchen DISCONTD: ADVAIR DISKUS 250-50 MCG/DOSE AEPB 1 puff daily.       Marland Kitchen azithromycin (ZITHROMAX) 250 MG tablet Take 1 tablet (250 mg total) by mouth daily. Take two once then one daily until gone  6 each  0  . predniSONE (DELTASONE) 10 MG tablet Take 4 for two days three for two days two for two days one for two days  20 tablet  0  . DISCONTD: Cholecalciferol (VITAMIN D-3 PO) Take by mouth daily.        Marland Kitchen DISCONTD: pravastatin (PRAVACHOL) 40 MG tablet Take by mouth At bedtime.       Facility-Administered Encounter Medications as of 02/28/2012  Medication Dose Route Frequency Provider Last Rate Last Dose  . iohexol (OMNIPAQUE) 300 MG/ML solution 100 mL  100 mL Intravenous Once PRN Medication Radiologist, MD   100 mL at 02/28/12 1513

## 2012-02-28 NOTE — Assessment & Plan Note (Addendum)
Asthmatic bronchitis with early chronic obstructive lung disease increased lower airway inflammation due to ongoing cigarette use Plan Use E cigarettes and Nicorette minis to reduce smoking volume Increase Advair one puff twice a day Azithromycin 250mg  Take two once then one daily until gone Prednisone 10mg  Take 4 for two days three for two days two for two days one for two days Note patient will receive flu vaccine at his place of work in the next week Return for recheck in 2 months

## 2012-02-28 NOTE — Assessment & Plan Note (Signed)
Ongoing nicotine use exacerbating lung disease I spent greater than 10 minutes reviewing smoking cessation counseling with this patient using feedback techniques Plan The patient appear to be more engaged today and willing to give another try at smoking cessation We will use E. cigarettes in a tapering dose along with Nicorette minis 4 mg Reeder 4 times daily

## 2012-03-13 ENCOUNTER — Ambulatory Visit: Payer: 59 | Admitting: Critical Care Medicine

## 2012-04-22 ENCOUNTER — Other Ambulatory Visit: Payer: Self-pay | Admitting: Gastroenterology

## 2012-05-01 ENCOUNTER — Ambulatory Visit (INDEPENDENT_AMBULATORY_CARE_PROVIDER_SITE_OTHER): Payer: 59 | Admitting: Critical Care Medicine

## 2012-05-01 ENCOUNTER — Encounter: Payer: Self-pay | Admitting: Critical Care Medicine

## 2012-05-01 VITALS — BP 106/74 | HR 86 | Temp 98.1°F | Ht 70.0 in | Wt 187.0 lb

## 2012-05-01 DIAGNOSIS — Z720 Tobacco use: Secondary | ICD-10-CM

## 2012-05-01 DIAGNOSIS — J449 Chronic obstructive pulmonary disease, unspecified: Secondary | ICD-10-CM

## 2012-05-01 DIAGNOSIS — F17200 Nicotine dependence, unspecified, uncomplicated: Secondary | ICD-10-CM

## 2012-05-01 NOTE — Assessment & Plan Note (Signed)
Gold stage C. COPD with ongoing tobacco use and recent exacerbation now improved Plan Patient was given an additional 3-10 minutes of smoking cessation counseling The patient is to continue to work on reduction in cigarette use using nicotine replacement therapy and behavioral counseling through the workplace Maintain inhaled medications as prescribed Return 4 months

## 2012-05-01 NOTE — Patient Instructions (Signed)
No change in medications. Return in       4 months Continue to work on smoking cessation

## 2012-05-01 NOTE — Progress Notes (Signed)
Subjective:    Patient ID: Frank Rosales, male    DOB: 1956/02/25, 56 y.o.   MRN: [id number]  HPI  56 y.o.M   06/2011 Had a company physical and needed f/u.   Had hilar adenopathy on CT Chest Pt notes prior CXR had abn at that time.   Notes a cough from smoking.  Not that dypsneic.  Notes some wheeze if does not take advair. No real chest pain.  No weight loss.   On Advair for two years,  Not using regularly. No fever,  Notes some nocturnal night sweats.    Mucus now is green to clear.   No hemoptysis. No real dyspnea.  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 No real edema.  Smokes 1-1.5PPD. Was 2PPD. Tried Chantix three times.    07/23/2011 Still smoking 1PPD. On advair.  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     02/28/2012 Not seen since 3/13. Here for f/u CT Chest with exposure hx and R sub hilar LAN. Pt had company asbestos evaluation.  PCP wanted another eval Not wearing a respirator now. No tearing out the asbestos .  Now gets asbestos in packing and gaskets and packing.   Still coughs up yellow green daily.  Pt still smokes.  AM is worse and gets better as day goes on. Dyspnea better as mucus is out. Pt is still welding. Still smoking 1PPD.  Tried nicotrol: helps some .  05/01/2012 Smoking is less : 4-5 cig/d.  Pt with less cough.  Less wheezing.  No new complaints Pt denies any significant sore throat, nasal congestion or excess secretions, fever, chills, sweats, unintended weight loss, pleurtic or exertional chest pain, orthopnea PND, or leg swelling Pt denies any increase in rescue therapy over baseline, denies waking up needing it or having any early am or nocturnal exacerbations of coughing/wheezing/or dyspnea. Pt also denies any obvious fluctuation in symptoms with  weather or environmental change or other alleviating or aggravating  factors Past Medical History  Diagnosis Date  . High blood pressure   . Asthma   . High cholesterol   . Hilar adenopathy      Family History  Problem Relation Age of Onset  . Emphysema Father   . Heart disease Brother   . Heart failure Mother   . Lung cancer Father      History   Social History  . Marital Status: Married    Spouse Name: N/A    Number of Children: 1  . 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 Smoking status: Current Every Day Smoker -- 0.3 packs/day    Types: Cigarettes  . Smokeless tobacco: Never Used     Comment: Started smoking at age 22.    Marland Kitchen Alcohol Use: Yes     Comment: 6 beers per day  . Drug Use: No  . Sexually Active: Not on file   Other Topics Concern  . Not on file   Social History Narrative  . No narrative on file     No Known Allergies   Outpatient Prescriptions Prior to Visit  Medication Sig Dispense Refill  . ALPRAZolam (XANAX) 0.5 MG tablet as needed.      . Cyanocobalamin (B-12 PO) Take by mouth daily.        Marland Kitchen  diazepam (VALIUM) 5 MG tablet as needed.      Marland Kitchen DIOVAN HCT 320-25 MG per tablet Take 1 tablet by mouth daily.       . Fluticasone-Salmeterol (ADVAIR DISKUS) 250-50 MCG/DOSE AEPB Inhale 1 puff into the lungs 2 (two) times daily.  60 each  6  . NIFEdipine (PROCARDIA XL/ADALAT-CC) 90 MG 24 hr tablet Take 90 mg by mouth daily.       Marland Kitchen SYNTHROID 25 MCG tablet Take 25 mcg by mouth daily.       Marland Kitchen aspirin 81 MG tablet ON HOLD      . [DISCONTINUED] azithromycin (ZITHROMAX) 250 MG tablet Take 1 tablet (250 mg total) by mouth daily. Take two once then one daily until gone  6 each  0  . [DISCONTINUED] CHERRY PO Take by mouth 2 (two) times daily. BLACK CHERRY EXTRACT      . [DISCONTINUED] fish oil-omega-3 fatty acids 1000 MG capsule Take by mouth. Omega Red daily      . [DISCONTINUED] FOLIC ACID PO Take by mouth daily.       . [DISCONTINUED] magnesium 30 MG tablet Take 30  mg by mouth daily.        . [DISCONTINUED] predniSONE (DELTASONE) 10 MG tablet Take 4 for two days three for two days two for two days one for two days  20 tablet  0  Last reviewed on 05/01/2012  8:55 AM by Storm Frisk, MD    Review of Systems  Constitutional: Negative for fever, chills, diaphoresis, activity change, appetite change, fatigue and unexpected weight change.  HENT: Positive for rhinorrhea and dental problem. Negative for hearing loss, ear pain, nosebleeds, congestion, sore throat, facial swelling, sneezing, mouth sores, trouble swallowing, neck pain, neck stiffness, voice change, postnasal drip, sinus pressure, tinnitus and ear discharge.   Eyes: Negative for photophobia, discharge, itching and visual disturbance.  Respiratory: Positive for cough. Negative for apnea, choking, chest tightness, shortness of breath, wheezing and stridor.   Cardiovascular: Negative for chest pain, palpitations and leg swelling.  Gastrointestinal: Negative for nausea, vomiting, abdominal pain, constipation, blood in stool and abdominal distention.  Genitourinary: Negative for dysuria, urgency, frequency, hematuria, flank pain, decreased urine volume and difficulty urinating.  Musculoskeletal: Positive for back pain and joint swelling. Negative for myalgias, arthralgias and gait problem.  Skin: Negative for color change, pallor and rash.  Neurological: Negative for dizziness, tremors, seizures, syncope, speech difficulty, weakness, light-headedness, numbness and headaches.  Hematological: Negative for adenopathy. Does not bruise/bleed easily.  Psychiatric/Behavioral: Negative for confusion, sleep disturbance and agitation. The patient is nervous/anxious.        Objective:   Physical Exam   Filed Vitals:   05/01/12 0844  BP: 106/74  Pulse: 86  Temp: 98.1 F (36.7 C)  TempSrc: Oral  Height: 5\' 10"  (1.778 m)  Weight: 187 lb (84.823 kg)  SpO2: 94%    Gen: Pleasant, well-nourished, in no  distress,  normal affect  ENT: No lesions,  mouth clear,  oropharynx clear, no postnasal drip  Neck: No JVD, no TMG, no carotid bruits  Lungs: No use of accessory muscles, no dullness to percussion, distant BS, scattered exp wheeze but improved, no rhonchi  Cardiovascular: RRR, heart sounds normal, no murmur or gallops, no peripheral edema  Abdomen: soft and NT, no HSM,  BS normal  Musculoskeletal: No deformities, no cyanosis or clubbing  Neuro: alert, non focal  Skin: Warm, no lesions or rashes        Assessment &  Plan:   Gold stage C. COPD with ongoing tobacco use and exacerbation Gold stage C. COPD with ongoing tobacco use and recent exacerbation now improved Plan Patient was given an additional 3-10 minutes of smoking cessation counseling The patient is to continue to work on reduction in cigarette use using nicotine replacement therapy and behavioral counseling through the workplace Maintain inhaled medications as prescribed Return 4 months    Updated Medication List Outpatient Encounter Prescriptions as of 05/01/2012  Medication Sig Dispense Refill  . ALPRAZolam (XANAX) 0.5 MG tablet as needed.      . Cyanocobalamin (B-12 PO) Take by mouth daily.        . diazepam (VALIUM) 5 MG tablet as needed.      Marland Kitchen DIOVAN HCT 320-25 MG per tablet Take 1 tablet by mouth daily.       . Fluticasone-Salmeterol (ADVAIR DISKUS) 250-50 MCG/DOSE AEPB Inhale 1 puff into the lungs 2 (two) times daily.  60 each  6  . NIFEdipine (PROCARDIA XL/ADALAT-CC) 90 MG 24 hr tablet Take 90 mg by mouth daily.       . OMEGA-3 KRILL OIL PO Take 1 capsule by mouth daily.      Marland Kitchen SYNTHROID 25 MCG tablet Take 25 mcg by mouth daily.       . VENTOLIN HFA 108 (90 BASE) MCG/ACT inhaler Inhale 2 puffs into the lungs Every 6 hours as needed.      Marland Kitchen aspirin 81 MG tablet ON HOLD      . [DISCONTINUED] azithromycin (ZITHROMAX) 250 MG tablet Take 1 tablet (250 mg total) by mouth daily. Take two once then one daily  until gone  6 each  0  . [DISCONTINUED] CHERRY PO Take by mouth 2 (two) times daily. BLACK CHERRY EXTRACT      . [DISCONTINUED] fish oil-omega-3 fatty acids 1000 MG capsule Take by mouth. Omega Red daily      . [DISCONTINUED] FOLIC ACID PO Take by mouth daily.       . [DISCONTINUED] magnesium 30 MG tablet Take 30 mg by mouth daily.        . [DISCONTINUED] predniSONE (DELTASONE) 10 MG tablet Take 4 for two days three for two days two for two days one for two days  20 tablet  0

## 2012-07-22 DIAGNOSIS — Z86010 Personal history of colonic polyps: Secondary | ICD-10-CM | POA: Insufficient documentation

## 2012-08-06 ENCOUNTER — Encounter: Payer: Self-pay | Admitting: Physician Assistant

## 2012-08-06 ENCOUNTER — Ambulatory Visit (INDEPENDENT_AMBULATORY_CARE_PROVIDER_SITE_OTHER): Payer: 59 | Admitting: Physician Assistant

## 2012-08-06 VITALS — BP 148/96 | HR 85 | Temp 97.9°F | Ht 70.0 in | Wt 186.4 lb

## 2012-08-06 DIAGNOSIS — R590 Localized enlarged lymph nodes: Secondary | ICD-10-CM

## 2012-08-06 DIAGNOSIS — R599 Enlarged lymph nodes, unspecified: Secondary | ICD-10-CM

## 2012-08-06 LAB — POCT CBC
Lymph, poc: 1.9 (ref 0.6–3.4)
MCH, POC: 35.4 pg — AB (ref 27–31.2)
MCHC: 35.7 g/dL — AB (ref 31.8–35.4)
MPV: 6.2 fL (ref 0–99.8)
POC LYMPH PERCENT: 21.4 %L (ref 10–50)
Platelet Count, POC: 224 10*3/uL (ref 142–424)
WBC: 9.1 10*3/uL (ref 4.6–10.2)

## 2012-08-06 LAB — COMPREHENSIVE METABOLIC PANEL
ALT: 31 U/L (ref 0–53)
CO2: 24 mEq/L (ref 19–32)
Calcium: 9.7 mg/dL (ref 8.4–10.5)
Chloride: 105 mEq/L (ref 96–112)
Sodium: 137 mEq/L (ref 135–145)
Total Protein: 7.2 g/dL (ref 6.0–8.3)

## 2012-08-06 MED ORDER — AMOXICILLIN-POT CLAVULANATE 875-125 MG PO TABS: 1.0000 | ORAL_TABLET | Freq: Two times a day (BID) | ORAL | Status: DC

## 2012-08-06 NOTE — Progress Notes (Signed)
  Subjective:    Patient ID: Frank Rosales, male    DOB: [birth year], 57 y.o.   MRN: [id number]  HPI Small lump under right ear making it difficult to move jaw   Review of Systems  HENT: Positive for neck pain. Negative for hearing loss, ear pain, nosebleeds, congestion, neck stiffness, dental problem, tinnitus and ear discharge.   Hematological: Positive for adenopathy. Does not bruise/bleed easily.  All other systems reviewed and are negative.       Objective:   Physical Exam  Vitals reviewed. Constitutional: He is oriented to person, place, and time. He appears well-developed and well-nourished.  HENT:  Head: Normocephalic and atraumatic.  Right Ear: External ear normal.  Left Ear: External ear normal.  Nose: Nose normal.  Mouth/Throat: Oropharynx is clear and moist.  Eyes: Conjunctivae and EOM are normal. Pupils are equal, round, and reactive to light.  Neck: Normal range of motion. No JVD present. No tracheal deviation present. No thyromegaly present.  Right upper cervical chain 1cm non tender node  Cardiovascular: Normal rate, regular rhythm and normal heart sounds.   Pulmonary/Chest: Effort normal and breath sounds normal.  Abdominal: Bowel sounds are normal.  Musculoskeletal: Normal range of motion.  Lymphadenopathy:    He has cervical adenopathy.  Neurological: He is alert and oriented to person, place, and time.  Skin: Skin is warm and dry.  Psychiatric: He has a normal mood and affect. His behavior is normal. Judgment and thought content normal.          Assessment & Plan:  Lymphadenopathy of right cervical region - Plan: amoxicillin-clavulanate (AUGMENTIN) 875-125 MG per tablet, POCT CBC, Comprehensive metabolic panel

## 2012-08-07 NOTE — Progress Notes (Signed)
Quick Note:  Labs were within normal limits ______ 

## 2012-08-14 ENCOUNTER — Ambulatory Visit (HOSPITAL_COMMUNITY)
Admission: RE | Admit: 2012-08-14 | Discharge: 2012-08-14 | Disposition: A | Discharge status: Home / Self Care | Payer: 59 | Source: Ambulatory Visit | Attending: Physician Assistant | Admitting: Physician Assistant

## 2012-08-14 ENCOUNTER — Other Ambulatory Visit: Payer: Self-pay | Admitting: Physician Assistant

## 2012-08-14 DIAGNOSIS — J449 Chronic obstructive pulmonary disease, unspecified: Secondary | ICD-10-CM | POA: Insufficient documentation

## 2012-08-14 DIAGNOSIS — H9209 Otalgia, unspecified ear: Secondary | ICD-10-CM | POA: Insufficient documentation

## 2012-08-14 DIAGNOSIS — J4489 Other specified chronic obstructive pulmonary disease: Secondary | ICD-10-CM | POA: Insufficient documentation

## 2012-08-14 DIAGNOSIS — R590 Localized enlarged lymph nodes: Secondary | ICD-10-CM

## 2012-08-14 MED ORDER — IOHEXOL 300 MG/ML  SOLN
75.0000 mL | Freq: Once | INTRAMUSCULAR | Status: AC | PRN
  Administered 2012-08-14: 75 mL via INTRAVENOUS

## 2012-08-27 ENCOUNTER — Telehealth: Payer: Self-pay | Admitting: Critical Care Medicine

## 2012-08-27 NOTE — Telephone Encounter (Signed)
Called patient 3x to schd follow up apt. No return call back. Sent letter 08/27/12. °

## 2012-10-08 ENCOUNTER — Ambulatory Visit (INDEPENDENT_AMBULATORY_CARE_PROVIDER_SITE_OTHER): Payer: 59 | Admitting: Physician Assistant

## 2012-10-08 ENCOUNTER — Ambulatory Visit (INDEPENDENT_AMBULATORY_CARE_PROVIDER_SITE_OTHER): Payer: 59

## 2012-10-08 ENCOUNTER — Encounter: Payer: Self-pay | Admitting: Physician Assistant

## 2012-10-08 VITALS — BP 126/81 | HR 84 | Temp 99.1°F | Ht 69.5 in | Wt 181.2 lb

## 2012-10-08 DIAGNOSIS — M542 Cervicalgia: Secondary | ICD-10-CM

## 2012-10-08 MED ORDER — MELOXICAM 15 MG PO TABS: 15.0000 mg | ORAL_TABLET | Freq: Every day | ORAL | Status: DC

## 2012-10-08 MED ORDER — CYCLOBENZAPRINE HCL 10 MG PO TABS: 10.0000 mg | ORAL_TABLET | Freq: Three times a day (TID) | ORAL | Status: DC | PRN

## 2012-10-08 NOTE — Progress Notes (Signed)
Subjective:     Patient ID: Frank Rosales, male   DOB: 05-Apr-1956, 57 y.o.   MRN: [id number]  HPI Pt with a 3 week hx of L sided neck pain Denies any radicular sx to the upper ext No known injury He has a hx of intermit LBP that requires steroid inj but no prev hx of neck sx He has tried intermit OTC NSAID that gives some relief  Review of Systems  All other systems reviewed and are negative.       Objective:   Physical Exam  Musculoskeletal:       Cervical back: He exhibits tenderness and pain. He exhibits normal range of motion, no bony tenderness, no swelling, no edema, no deformity and no spasm.  + TTP trap area L>R Spurling sign neg FROM but sx with rotation Shoulder shrug equal Strength equal in upper ext Pulses/sensory good Xray- degen changes noted, will be over-read     Assessment:     1. Neck pain        Plan:     Heat/Ice Mobic and Flexeril rx- SE reviewed Massage Gentle stretching F/U prn

## 2012-10-08 NOTE — Patient Instructions (Signed)

## 2012-10-10 ENCOUNTER — Ambulatory Visit (HOSPITAL_COMMUNITY)
Admission: RE | Admit: 2012-10-10 | Discharge: 2012-10-10 | Disposition: A | Discharge status: Home / Self Care | Payer: 59 | Source: Ambulatory Visit | Attending: Physician Assistant | Admitting: Physician Assistant

## 2012-10-10 DIAGNOSIS — I6529 Occlusion and stenosis of unspecified carotid artery: Secondary | ICD-10-CM | POA: Insufficient documentation

## 2012-10-10 DIAGNOSIS — M542 Cervicalgia: Secondary | ICD-10-CM

## 2012-10-10 DIAGNOSIS — I658 Occlusion and stenosis of other precerebral arteries: Secondary | ICD-10-CM | POA: Insufficient documentation

## 2012-11-06 ENCOUNTER — Ambulatory Visit (INDEPENDENT_AMBULATORY_CARE_PROVIDER_SITE_OTHER): Payer: 59 | Admitting: Critical Care Medicine

## 2012-11-06 ENCOUNTER — Encounter: Payer: Self-pay | Admitting: Critical Care Medicine

## 2012-11-06 VITALS — BP 120/82 | HR 100 | Temp 98.3°F | Ht 69.5 in | Wt 183.0 lb

## 2012-11-06 DIAGNOSIS — J449 Chronic obstructive pulmonary disease, unspecified: Secondary | ICD-10-CM

## 2012-11-06 DIAGNOSIS — M949 Disorder of cartilage, unspecified: Secondary | ICD-10-CM

## 2012-11-06 DIAGNOSIS — F17200 Nicotine dependence, unspecified, uncomplicated: Secondary | ICD-10-CM

## 2012-11-06 DIAGNOSIS — M8580 Other specified disorders of bone density and structure, unspecified site: Secondary | ICD-10-CM

## 2012-11-06 DIAGNOSIS — Z720 Tobacco use: Secondary | ICD-10-CM

## 2012-11-06 DIAGNOSIS — M899 Disorder of bone, unspecified: Secondary | ICD-10-CM

## 2012-11-06 MED ORDER — PREDNISONE 10 MG PO TABS: ORAL_TABLET | ORAL | Status: DC

## 2012-11-06 MED ORDER — AZITHROMYCIN 250 MG PO TABS: 250.0000 mg | ORAL_TABLET | Freq: Every day | ORAL | Status: DC

## 2012-11-06 MED ORDER — NICOTINE 10 MG IN INHA: RESPIRATORY_TRACT | Status: DC

## 2012-11-06 NOTE — Assessment & Plan Note (Signed)
AECOPD AB flare , ongoing tobacco use Plan Prednisone 10mg  Take 4 tablets daily for 5 days then stop Azithromycin 250mg  Take two once then one daily until gone Stay on Advair Stop smoking with Nicotrol and nicorette minis Return 4 months

## 2012-11-06 NOTE — Assessment & Plan Note (Signed)
>   smoking cessation counseling given Nicotrol/nicorette minis

## 2012-11-06 NOTE — Patient Instructions (Addendum)
Prednisone 10mg  Take 4 tablets daily for 5 days then stop Azithromycin 250mg  Take two once then one daily until gone Stay on Advair Stop smoking with Nicotrol and nicorette minis Return 4 months

## 2012-11-06 NOTE — Progress Notes (Signed)
Subjective:    Patient ID: Frank Rosales, male    DOB: 1956/02/04, 57 y.o.   MRN: [id number]  HPI  57 y.o.M  11/06/2012 Chief Complaint  Patient presents with  . 6 month follow up    Breathing unchanged.  Has SOB mostly in the mornings with congestion and DOE, prod cough, and a little wheezing.  Mucus is mostly clear but can be brown, black, or green.  No fever, chest tightess, or chest pain.  Pt is still symptomatic. Still with prod cough and thick green. STill dyspneic with exertion.  Still smokes 1PPD Pt denies any significant sore throat, nasal congestion or , fever, chills, sweats, unintended weight loss, pleurtic or exertional chest pain, orthopnea PND, or leg swelling Pt denies any increase in rescue therapy over baseline, denies waking up needing it or having any early am or nocturnal exacerbations of coughing/wheezing/or dyspnea.    Past Medical History  Diagnosis Date  . High blood pressure   . Asthma   . High cholesterol   . Hilar adenopathy   . COPD (chronic obstructive pulmonary disease)   . Thyroid disease   . Anxiety   . Colon polyps      Family History  Problem Relation Age of Onset  . Emphysema Father   . Heart disease Brother   . Heart failure Mother   . Lung cancer Father      History   Social History  . Marital Status: Married    Spouse Name: N/A    Number of Children: 1  . 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 Smoking status: Current Every Day Smoker -- 1.00 packs/day    Types: Cigarettes  . Smokeless tobacco: Never Used     Comment: Started smoking at age 4.    Marland Kitchen Alcohol Use: 24.0 oz/week    40 Cans of beer per week     Comment: 6 beers per day  . Drug Use: No  . Sexually Active: Not on file   Other Topics Concern  . Not on file   Social History Narrative  . No narrative on file     No Known Allergies   Outpatient Prescriptions Prior to Visit  Medication Sig  Dispense Refill  . ALPRAZolam (XANAX) 0.5 MG tablet as needed.      Marland Kitchen aspirin 81 MG tablet Take 81 mg by mouth daily. Hold's medication prior to spinal injections      . cholecalciferol (VITAMIN D) 1000 UNITS tablet Take 1,000 Units by mouth daily.      . cyclobenzaprine (FLEXERIL) 10 MG tablet Take 1 tablet (10 mg total) by mouth 3 (three) times daily as needed for muscle spasms.  30 tablet  0  . DIOVAN HCT 320-25 MG per tablet Take 1 tablet by mouth daily.       . Fluticasone-Salmeterol (ADVAIR DISKUS) 250-50 MCG/DOSE AEPB Inhale 1 puff into the lungs 2 (two) times daily.  60 each  6  . NIFEdipine (PROCARDIA XL/ADALAT-CC) 90 MG 24 hr tablet Take 90 mg by mouth daily.       . OMEGA-3 KRILL OIL PO Take 1 capsule by mouth daily.      Marland Kitchen SYNTHROID 25 MCG tablet Take 25 mcg by mouth daily.       . VENTOLIN HFA 108 (90 BASE) MCG/ACT inhaler Inhale 2 puffs into the lungs Every 6 hours as needed.      Marland Kitchen  Cyanocobalamin (B-12 PO) Take by mouth daily.        . diazepam (VALIUM) 5 MG tablet as needed.      . meloxicam (MOBIC) 15 MG tablet Take 1 tablet (15 mg total) by mouth daily.  15 tablet  0   No facility-administered medications prior to visit.      Review of Systems  Constitutional: Negative for fever, chills, diaphoresis, activity change, appetite change, fatigue and unexpected weight change.  HENT: Positive for rhinorrhea and dental problem. Negative for hearing loss, ear pain, nosebleeds, congestion, sore throat, facial swelling, sneezing, mouth sores, trouble swallowing, neck pain, neck stiffness, voice change, postnasal drip, sinus pressure, tinnitus and ear discharge.   Eyes: Negative for photophobia, discharge, itching and visual disturbance.  Respiratory: Positive for cough. Negative for apnea, choking, chest tightness, shortness of breath, wheezing and stridor.   Cardiovascular: Negative for chest pain, palpitations and leg swelling.  Gastrointestinal: Negative for nausea, vomiting,  abdominal pain, constipation, blood in stool and abdominal distention.  Genitourinary: Negative for dysuria, urgency, frequency, hematuria, flank pain, decreased urine volume and difficulty urinating.  Musculoskeletal: Positive for back pain and joint swelling. Negative for myalgias, arthralgias and gait problem.  Skin: Negative for color change, pallor and rash.  Neurological: Negative for dizziness, tremors, seizures, syncope, speech difficulty, weakness, light-headedness, numbness and headaches.  Hematological: Negative for adenopathy. Does not bruise/bleed easily.  Psychiatric/Behavioral: Negative for confusion, sleep disturbance and agitation. The patient is nervous/anxious.        Objective:   Physical Exam   Filed Vitals:   11/06/12 0854  BP: 120/82  Pulse: 100  Temp: 98.3 F (36.8 C)  TempSrc: Oral  Height: 5' 9.5" (1.765 m)  Weight: 183 lb (83.008 kg)  SpO2: 94%    Gen: Pleasant, well-nourished, in no distress,  normal affect  ENT: No lesions,  mouth clear,  oropharynx clear, no postnasal drip  Neck: No JVD, no TMG, no carotid bruits  Lungs: No use of accessory muscles, no dullness to percussion, distant BS, scattered exp wheeze , scattered rhonchi  Cardiovascular: RRR, heart sounds normal, no murmur or gallops, no peripheral edema  Abdomen: soft and NT, no HSM,  BS normal  Musculoskeletal: No deformities, no cyanosis or clubbing  Neuro: alert, non focal  Skin: Warm, no lesions or rashes        Assessment & Plan:   Chronic bronchitis with gold stage C. COPD and ongoing tobacco use AECOPD AB flare , ongoing tobacco use Plan Prednisone 10mg  Take 4 tablets daily for 5 days then stop Azithromycin 250mg  Take two once then one daily until gone Stay on Advair Stop smoking with Nicotrol and nicorette minis Return 4 months   Nicotine abuse > smoking cessation counseling given Nicotrol/nicorette minis     Updated Medication List Outpatient  Encounter Prescriptions as of 11/06/2012  Medication Sig Dispense Refill  . ALPRAZolam (XANAX) 0.5 MG tablet as needed.      Marland Kitchen aspirin 81 MG tablet Take 81 mg by mouth daily. Hold's medication prior to spinal injections      . cholecalciferol (VITAMIN D) 1000 UNITS tablet Take 1,000 Units by mouth daily.      . cyclobenzaprine (FLEXERIL) 10 MG tablet Take 1 tablet (10 mg total) by mouth 3 (three) times daily as needed for muscle spasms.  30 tablet  0  . DIOVAN HCT 320-25 MG per tablet Take 1 tablet by mouth daily.       . Fluticasone-Salmeterol (ADVAIR DISKUS) 250-50  MCG/DOSE AEPB Inhale 1 puff into the lungs 2 (two) times daily.  60 each  6  . NIFEdipine (PROCARDIA XL/ADALAT-CC) 90 MG 24 hr tablet Take 90 mg by mouth daily.       . OMEGA-3 KRILL OIL PO Take 1 capsule by mouth daily.      Marland Kitchen SYNTHROID 25 MCG tablet Take 25 mcg by mouth daily.       . VENTOLIN HFA 108 (90 BASE) MCG/ACT inhaler Inhale 2 puffs into the lungs Every 6 hours as needed.      Marland Kitchen azithromycin (ZITHROMAX) 250 MG tablet Take 1 tablet (250 mg total) by mouth daily. Take two once then one daily until gone  6 each  0  . nicotine (NICOTROL) 10 MG inhaler 60-80 puff per cartridge, 6 cartridges per day  168 each  2  . predniSONE (DELTASONE) 10 MG tablet Take 4 tablets daily for 5 days then stop  20 tablet  0  . [DISCONTINUED] Cyanocobalamin (B-12 PO) Take by mouth daily.        . [DISCONTINUED] diazepam (VALIUM) 5 MG tablet as needed.      . [DISCONTINUED] meloxicam (MOBIC) 15 MG tablet Take 1 tablet (15 mg total) by mouth daily.  15 tablet  0   No facility-administered encounter medications on file as of 11/06/2012.

## 2013-02-02 ENCOUNTER — Other Ambulatory Visit: Payer: Self-pay | Admitting: *Deleted

## 2013-02-02 DIAGNOSIS — E785 Hyperlipidemia, unspecified: Secondary | ICD-10-CM

## 2013-02-02 DIAGNOSIS — I10 Essential (primary) hypertension: Secondary | ICD-10-CM

## 2013-02-02 DIAGNOSIS — R5383 Other fatigue: Secondary | ICD-10-CM

## 2013-02-02 DIAGNOSIS — E039 Hypothyroidism, unspecified: Secondary | ICD-10-CM

## 2013-02-03 ENCOUNTER — Other Ambulatory Visit (INDEPENDENT_AMBULATORY_CARE_PROVIDER_SITE_OTHER): Payer: 59

## 2013-02-03 DIAGNOSIS — R5383 Other fatigue: Secondary | ICD-10-CM

## 2013-02-03 DIAGNOSIS — I10 Essential (primary) hypertension: Secondary | ICD-10-CM

## 2013-02-03 DIAGNOSIS — E039 Hypothyroidism, unspecified: Secondary | ICD-10-CM

## 2013-02-03 DIAGNOSIS — E785 Hyperlipidemia, unspecified: Secondary | ICD-10-CM

## 2013-02-03 DIAGNOSIS — R5381 Other malaise: Secondary | ICD-10-CM

## 2013-02-03 LAB — POCT CBC
Granulocyte percent: 76.9 %G (ref 37–80)
HCT, POC: 49.7 % (ref 43.5–53.7)
Hemoglobin: 17.2 g/dL (ref 14.1–18.1)
Lymph, poc: 1.4 (ref 0.6–3.4)
MCH, POC: 33.7 pg — AB (ref 27–31.2)
MCHC: 34.6 g/dL (ref 31.8–35.4)
MCV: 97.5 fL — AB (ref 80–97)
MPV: 6.2 fL (ref 0–99.8)
POC Granulocyte: 5.9 (ref 2–6.9)
POC LYMPH PERCENT: 18.5 %L (ref 10–50)
Platelet Count, POC: 251 10*3/uL (ref 142–424)
RBC: 5.1 M/uL (ref 4.69–6.13)
RDW, POC: 13.5 %
WBC: 7.7 10*3/uL (ref 4.6–10.2)

## 2013-02-03 NOTE — Progress Notes (Signed)
Pt came in for labs only 

## 2013-02-04 LAB — CMP14+EGFR
ALT: 27 IU/L (ref 0–44)
AST: 23 IU/L (ref 0–40)
Albumin/Globulin Ratio: 2.3 (ref 1.1–2.5)
Albumin: 4.9 g/dL (ref 3.5–5.5)
Alkaline Phosphatase: 77 IU/L (ref 39–117)
BUN/Creatinine Ratio: 12 (ref 9–20)
BUN: 12 mg/dL (ref 6–24)
CO2: 25 mmol/L (ref 18–29)
Calcium: 9.7 mg/dL (ref 8.7–10.2)
Chloride: 95 mmol/L — ABNORMAL LOW (ref 97–108)
Creatinine, Ser: 0.99 mg/dL (ref 0.76–1.27)
GFR calc Af Amer: 99 mL/min/{1.73_m2} (ref >59)
GFR calc non Af Amer: 85 mL/min/{1.73_m2} (ref >59)
Globulin, Total: 2.1 g/dL (ref 1.5–4.5)
Glucose: 133 mg/dL — ABNORMAL HIGH (ref 65–99)
Potassium: 4.7 mmol/L (ref 3.5–5.2)
Sodium: 137 mmol/L (ref 134–144)
Total Bilirubin: 0.4 mg/dL (ref 0.0–1.2)
Total Protein: 7 g/dL (ref 6.0–8.5)

## 2013-02-04 LAB — LIPID PANEL
Chol/HDL Ratio: 3.6 ratio units (ref 0.0–5.0)
Cholesterol, Total: 234 mg/dL — ABNORMAL HIGH (ref 100–199)
HDL: 65 mg/dL (ref >39)
LDL Calculated: 148 mg/dL — ABNORMAL HIGH (ref 0–99)
Triglycerides: 103 mg/dL (ref 0–149)
VLDL Cholesterol Cal: 21 mg/dL (ref 5–40)

## 2013-02-04 LAB — THYROID PANEL WITH TSH
Free Thyroxine Index: 2 (ref 1.2–4.9)
T3 Uptake Ratio: 30 % (ref 24–39)
T4, Total: 6.8 ug/dL (ref 4.5–12.0)
TSH: 3.7 u[IU]/mL (ref 0.450–4.500)

## 2013-02-04 LAB — VITAMIN D 25 HYDROXY (VIT D DEFICIENCY, FRACTURES): Vit D, 25-Hydroxy: 37 ng/mL (ref 30.0–100.0)

## 2013-02-06 ENCOUNTER — Encounter: Payer: Self-pay | Admitting: Family Medicine

## 2013-02-06 ENCOUNTER — Ambulatory Visit (INDEPENDENT_AMBULATORY_CARE_PROVIDER_SITE_OTHER): Payer: 59 | Admitting: Family Medicine

## 2013-02-06 VITALS — BP 133/83 | HR 86 | Temp 97.4°F | Ht 68.5 in | Wt 185.0 lb

## 2013-02-06 DIAGNOSIS — Z23 Encounter for immunization: Secondary | ICD-10-CM

## 2013-02-06 DIAGNOSIS — E039 Hypothyroidism, unspecified: Secondary | ICD-10-CM

## 2013-02-06 DIAGNOSIS — J441 Chronic obstructive pulmonary disease with (acute) exacerbation: Secondary | ICD-10-CM

## 2013-02-06 DIAGNOSIS — I10 Essential (primary) hypertension: Secondary | ICD-10-CM

## 2013-02-06 DIAGNOSIS — Z0000 Encounter for general adult medical examination without abnormal findings: Secondary | ICD-10-CM

## 2013-02-06 DIAGNOSIS — E785 Hyperlipidemia, unspecified: Secondary | ICD-10-CM

## 2013-02-06 MED ORDER — LEVOTHYROXINE SODIUM 25 MCG PO TABS: 25.0000 ug | ORAL_TABLET | Freq: Every day | ORAL | Status: DC

## 2013-02-06 MED ORDER — VITAMIN B-1 100 MG PO TABS
100.0000 mg | ORAL_TABLET | Freq: Every day | ORAL | Status: DC
Start: 2013-02-06 — End: 2013-02-09

## 2013-02-06 MED ORDER — ALPRAZOLAM 0.5 MG PO TABS: 0.5000 mg | ORAL_TABLET | Freq: Three times a day (TID) | ORAL | Status: DC | PRN

## 2013-02-06 MED ORDER — FLUTICASONE-SALMETEROL 250-50 MCG/DOSE IN AEPB: INHALATION_SPRAY | RESPIRATORY_TRACT | Status: DC

## 2013-02-06 MED ORDER — VALSARTAN-HYDROCHLOROTHIAZIDE 320-25 MG PO TABS: 1.0000 | ORAL_TABLET | Freq: Every day | ORAL | Status: DC

## 2013-02-06 MED ORDER — FOLIC ACID 1 MG PO TABS: 1.0000 mg | ORAL_TABLET | Freq: Every day | ORAL | Status: AC

## 2013-02-06 MED ORDER — NIFEDIPINE ER OSMOTIC RELEASE 90 MG PO TB24: 90.0000 mg | ORAL_TABLET | Freq: Every day | ORAL | Status: DC

## 2013-02-06 NOTE — Progress Notes (Signed)
  Subjective:    Patient ID: Frank Rosales, male    DOB: March 05, 1956, 57 y.o.   MRN: [id number]  HPI  This 57 y.o. male presents for evaluation of CPE.   He has hx of colon polyps and has recently had one removed.  He has hx of pulmonary nodule and sees pulmonary.  He is due for appointment with His pulmonologist.  He usually gets annual CT of the chest.  He has had a colonoscopy recently and had a polyp removed.  He smokes and drinks beer daily.  He is taking folic acid.  He states he is trying to quit smoking and is trying to decrease his beer drinking to no more than 4 in a day.  He has Hx of hypertension, COPD,and hyperlipidemia.  He states he doesn't want to take any rx cholesterol meds if he can help it.  He needs some refills.    Review of Systems    No chest pain, SOB, HA, dizziness, vision change, N/V, diarrhea, constipation, dysuria, urinary urgency or frequency, myalgias, arthralgias or rash.  Objective:   Physical Exam  Vital signs noted  Well developed well nourished male.  HEENT - Head atraumatic Normocephalic                Eyes - PERRLA, Conjuctiva - clear Sclera- Clear EOMI                Ears - EAC's Wnl TM's Wnl Gross Hearing WNL                Nose - Nares patent                 Throat - oropharanx wnl Respiratory - Lungs CTA bilateral Cardiac - RRR S1 and S2 without murmur GI - Abdomen soft Nontender and bowel sounds active x 4 Rectal - Rectal tone is normal and prostate exam is normal and hemocult stool is negative. Extremities - No edema. Neuro - Grossly intact.      Assessment & Plan:  Need for prophylactic vaccination with combined diphtheria-tetanus-pertussis (DTP) vaccine - Plan: Tdap vaccine greater than or equal to 7yo IM, thiamine (VITAMIN B-1) 100 MG tablet, levothyroxine (SYNTHROID) 25 MCG tablet  Routine general medical examination at a health care facility - Plan: thiamine (VITAMIN B-1) 100 MG tablet, levothyroxine (SYNTHROID) 25 MCG tablet,  PSA, total and free  Essential hypertension, benign - Plan: valsartan-hydrochlorothiazide (DIOVAN HCT) 320-25 MG per tablet, NIFEdipine (PROCARDIA XL/ADALAT-CC) 90 MG 24 hr tablet, levothyroxine (SYNTHROID) 25 MCG tablet  Other and unspecified hyperlipidemia - Plan: levothyroxine (SYNTHROID) 25 MCG tablet  COPD exacerbation - Plan: levothyroxine (SYNTHROID) 25 MCG tablet, Fluticasone-Salmeterol (ADVAIR DISKUS) 250-50 MCG/DOSE AEPB  Unspecified hypothyroidism - Plan: levothyroxine (SYNTHROID) 25 MCG tablet, Fluticasone-Salmeterol (ADVAIR DISKUS) 250-50 MCG/DOSE AEPB  Follow up in one year  Deatra Canter FNP

## 2013-02-06 NOTE — Progress Notes (Signed)
Pt notified of results at appt

## 2013-02-06 NOTE — Patient Instructions (Addendum)

## 2013-02-07 LAB — PSA, TOTAL AND FREE
PSA, Free Pct: 31.1 %
PSA, Free: 0.28 ng/mL
PSA: 0.9 ng/mL (ref 0.0–4.0)

## 2013-02-09 ENCOUNTER — Other Ambulatory Visit: Payer: Self-pay | Admitting: Family Medicine

## 2013-02-09 DIAGNOSIS — Z0000 Encounter for general adult medical examination without abnormal findings: Secondary | ICD-10-CM

## 2013-02-09 DIAGNOSIS — Z23 Encounter for immunization: Secondary | ICD-10-CM

## 2013-02-09 MED ORDER — VITAMIN B-1 100 MG PO TABS: 100.0000 mg | ORAL_TABLET | Freq: Every day | ORAL | Status: DC

## 2013-02-10 NOTE — Addendum Note (Signed)
Addended by: Bearl Mulberry on: 02/10/2013 10:54 AM   Modules accepted: Orders

## 2013-02-13 ENCOUNTER — Telehealth: Payer: Self-pay | Admitting: *Deleted

## 2013-02-13 NOTE — Telephone Encounter (Signed)
Pt's wife notified of results. 

## 2013-02-13 NOTE — Telephone Encounter (Signed)
Message copied by Bearl Mulberry on Fri Feb 13, 2013  2:05 PM ------      Message from: Deatra Canter      Created: Wed Feb 11, 2013  9:28 AM       PSA normal ------

## 2013-03-09 ENCOUNTER — Other Ambulatory Visit: Payer: Self-pay | Admitting: Family Medicine

## 2013-03-09 DIAGNOSIS — M549 Dorsalgia, unspecified: Secondary | ICD-10-CM

## 2013-03-09 DIAGNOSIS — R9389 Abnormal findings on diagnostic imaging of other specified body structures: Secondary | ICD-10-CM

## 2013-03-10 NOTE — Addendum Note (Signed)
Addended by: Bearl Mulberry on: 03/10/2013 06:27 PM   Modules accepted: Orders

## 2013-03-11 ENCOUNTER — Ambulatory Visit (INDEPENDENT_AMBULATORY_CARE_PROVIDER_SITE_OTHER): Payer: 59

## 2013-03-11 ENCOUNTER — Other Ambulatory Visit: Payer: Self-pay | Admitting: Family Medicine

## 2013-03-11 ENCOUNTER — Ambulatory Visit (INDEPENDENT_AMBULATORY_CARE_PROVIDER_SITE_OTHER): Payer: 59 | Admitting: Pharmacist

## 2013-03-11 DIAGNOSIS — M899 Disorder of bone, unspecified: Secondary | ICD-10-CM

## 2013-03-11 DIAGNOSIS — M8580 Other specified disorders of bone density and structure, unspecified site: Secondary | ICD-10-CM | POA: Insufficient documentation

## 2013-03-11 DIAGNOSIS — R9389 Abnormal findings on diagnostic imaging of other specified body structures: Secondary | ICD-10-CM

## 2013-03-11 DIAGNOSIS — M549 Dorsalgia, unspecified: Secondary | ICD-10-CM

## 2013-03-11 NOTE — Progress Notes (Signed)
Patient ID: Frank Rosales, male   DOB: 1955-10-14, 57 y.o.   MRN: [id number]   Patient left before I reviewed DEXA but I did go over with this wife.  Patient has a history of compression fracture of vertebra with vertebralplasty.    Dexa showed osteopenia (lowest t-score was -1.2) Frax estimate is 9.4% for major osteoporotic fracture and 1.0% for hip fracture.   Recommend - increase calcium intake to 1000mg  daily Testing to r/o secondary causes of low BMD - testosterone / prolactin/ FSH and LH and PTH.

## 2013-03-11 NOTE — Addendum Note (Signed)
Addended by: Henrene Pastor on: 03/11/2013 03:43 PM   Modules accepted: Orders, Level of Service

## 2013-03-16 ENCOUNTER — Telehealth: Payer: Self-pay | Admitting: Critical Care Medicine

## 2013-03-16 NOTE — Telephone Encounter (Signed)
Called Patient to set up follow up apt, Left message x3. No return call back. Sent letter 03/16/13  ° °

## 2013-04-10 ENCOUNTER — Other Ambulatory Visit: Payer: Self-pay | Admitting: Neurosurgery

## 2013-04-10 DIAGNOSIS — M5416 Radiculopathy, lumbar region: Secondary | ICD-10-CM

## 2013-04-13 ENCOUNTER — Ambulatory Visit
Admission: RE | Admit: 2013-04-13 | Discharge: 2013-04-13 | Disposition: A | Discharge status: Home / Self Care | Payer: 59 | Source: Ambulatory Visit | Attending: Neurosurgery | Admitting: Neurosurgery

## 2013-04-13 DIAGNOSIS — M5416 Radiculopathy, lumbar region: Secondary | ICD-10-CM

## 2013-04-14 ENCOUNTER — Encounter: Payer: Self-pay | Admitting: Critical Care Medicine

## 2013-04-14 ENCOUNTER — Ambulatory Visit (INDEPENDENT_AMBULATORY_CARE_PROVIDER_SITE_OTHER): Payer: 59 | Admitting: Critical Care Medicine

## 2013-04-14 ENCOUNTER — Ambulatory Visit (HOSPITAL_BASED_OUTPATIENT_CLINIC_OR_DEPARTMENT_OTHER)
Admission: RE | Admit: 2013-04-14 | Discharge: 2013-04-14 | Disposition: A | Discharge status: Home / Self Care | Payer: 59 | Source: Ambulatory Visit | Attending: Critical Care Medicine | Admitting: Critical Care Medicine

## 2013-04-14 VITALS — BP 126/84 | HR 83 | Temp 98.3°F | Ht 69.0 in | Wt 187.0 lb

## 2013-04-14 DIAGNOSIS — R222 Localized swelling, mass and lump, trunk: Secondary | ICD-10-CM | POA: Insufficient documentation

## 2013-04-14 DIAGNOSIS — R918 Other nonspecific abnormal finding of lung field: Secondary | ICD-10-CM

## 2013-04-14 DIAGNOSIS — Z23 Encounter for immunization: Secondary | ICD-10-CM

## 2013-04-14 DIAGNOSIS — F17200 Nicotine dependence, unspecified, uncomplicated: Secondary | ICD-10-CM

## 2013-04-14 DIAGNOSIS — J441 Chronic obstructive pulmonary disease with (acute) exacerbation: Secondary | ICD-10-CM

## 2013-04-14 DIAGNOSIS — J449 Chronic obstructive pulmonary disease, unspecified: Secondary | ICD-10-CM

## 2013-04-14 DIAGNOSIS — Z720 Tobacco use: Secondary | ICD-10-CM

## 2013-04-14 DIAGNOSIS — J438 Other emphysema: Secondary | ICD-10-CM | POA: Insufficient documentation

## 2013-04-14 MED ORDER — AZITHROMYCIN 250 MG PO TABS: 250.0000 mg | ORAL_TABLET | Freq: Every day | ORAL | Status: DC

## 2013-04-14 MED ORDER — METHYLPREDNISOLONE ACETATE 80 MG/ML IJ SUSP
120.0000 mg | Freq: Once | INTRAMUSCULAR | Status: AC
  Administered 2013-04-14: 120 mg via INTRAMUSCULAR

## 2013-04-14 NOTE — Assessment & Plan Note (Signed)
Nicotine addiction Plan Behavioral health referral for smoking cessation >2min smoking cessation counseling given Continue with nicotine replacement Rx

## 2013-04-14 NOTE — Patient Instructions (Signed)
Azithromycin 250mg  Take two once then one daily until gone (sent downstairs) Pneumovax given Depo medrol 120mg  IM given Stop smoking use nicoderm step 1 patch and a referral to smoking cessation counseling with behavioral health will be made No change in other medications A CT Chest will be , we will call results Return 4 months

## 2013-04-14 NOTE — Progress Notes (Signed)
Subjective:    Patient ID: Frank Rosales, male    DOB: April 13, 1956, 57 y.o.   MRN: [id number]  HPI  57 y.o.M  04/14/2013 Chief Complaint  Patient presents with  . 6 month follow up    Breathing is unchanged.  Notices SOB mostly in the mornings.  Has prod cough with clear, black, or green mucus.  Wheezing at times.  No chest tightness/pain or fever.  Smoking 1PPD.  Unchanged with dyspnea.  Pt wants to quit smoking but too easy not to stop.  Mucus now is clear to sl green.  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  Past Medical History  Diagnosis Date  . High blood pressure   . Asthma   . High cholesterol   . Hilar adenopathy   . COPD (chronic obstructive pulmonary disease)   . Thyroid disease   . Anxiety   . Colon polyps      Family History  Problem Relation Age of Onset  . Emphysema Father   . Heart disease Brother   . Heart failure Mother   . Lung cancer Father      History   Social History  . Marital Status: Married    Spouse Name: N/A    Number of Children: 1  . 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 Smoking status: Current Every Day Smoker -- 1.00 packs/day    Types: Cigarettes    Start date: 05/14/1973  . Smokeless tobacco: Never Used     Comment: Started smoking at age 57.    Marland Kitchen Alcohol Use: 24.0 oz/week    40 Cans of beer per week     Comment: 6 beers per day  . Drug Use: No  . Sexual Activity: Not on file   Other Topics Concern  . Not on file   Social History Narrative  . No narrative on file     No Known Allergies   Outpatient Prescriptions Prior to Visit  Medication Sig Dispense Refill  . ALPRAZolam (XANAX) 0.5 MG tablet Take 1 tablet (0.5 mg total) by mouth 3 (three) times daily as needed.  30 tablet  3  . aspirin 81 MG tablet Take 81 mg by mouth daily. Hold's medication prior to spinal injections      . cholecalciferol (VITAMIN D) 1000 UNITS tablet Take 2,000 Units by  mouth daily.       . Fluticasone-Salmeterol (ADVAIR DISKUS) 250-50 MCG/DOSE AEPB Inhale 1 puff into the lungs 2 (two) times daily.  60 each  6  . folic acid (FOLVITE) 1 MG tablet Take 1 tablet (1 mg total) by mouth daily.  90 tablet  3  . levothyroxine (SYNTHROID) 25 MCG tablet Take 1 tablet (25 mcg total) by mouth daily.  90 tablet  3  . NIFEdipine (PROCARDIA XL/ADALAT-CC) 90 MG 24 hr tablet Take 1 tablet (90 mg total) by mouth daily.  90 tablet  3  . OMEGA-3 KRILL OIL PO Take 1 capsule by mouth daily.      . valsartan-hydrochlorothiazide (DIOVAN HCT) 320-25 MG per tablet Take 1 tablet by mouth daily.  90 tablet  3  . VENTOLIN HFA 108 (90 BASE) MCG/ACT inhaler Inhale 2 puffs into the lungs Every 6 hours as needed.      . vitamin B-12 (CYANOCOBALAMIN) 250 MCG tablet Take 250 mcg by mouth daily.      . cyclobenzaprine (FLEXERIL) 10 MG  tablet Take 1 tablet (10 mg total) by mouth 3 (three) times daily as needed for muscle spasms.  30 tablet  0  . thiamine (VITAMIN B-1) 100 MG tablet Take 1 tablet (100 mg total) by mouth daily.  90 tablet  3   No facility-administered medications prior to visit.      Review of Systems  Constitutional: Negative for fever, chills, diaphoresis, activity change, appetite change, fatigue and unexpected weight change.  HENT: Positive for dental problem and rhinorrhea. Negative for congestion, ear discharge, ear pain, facial swelling, hearing loss, mouth sores, nosebleeds, postnasal drip, sinus pressure, sneezing, sore throat, tinnitus, trouble swallowing and voice change.   Eyes: Negative for photophobia, discharge, itching and visual disturbance.  Respiratory: Positive for cough. Negative for apnea, choking, chest tightness, shortness of breath, wheezing and stridor.   Cardiovascular: Negative for chest pain, palpitations and leg swelling.  Gastrointestinal: Negative for nausea, vomiting, abdominal pain, constipation, blood in stool and abdominal distention.   Genitourinary: Negative for dysuria, urgency, frequency, hematuria, flank pain, decreased urine volume and difficulty urinating.  Musculoskeletal: Positive for back pain and joint swelling. Negative for arthralgias, gait problem, myalgias, neck pain and neck stiffness.  Skin: Negative for color change, pallor and rash.  Neurological: Negative for dizziness, tremors, seizures, syncope, speech difficulty, weakness, light-headedness, numbness and headaches.  Hematological: Negative for adenopathy. Does not bruise/bleed easily.  Psychiatric/Behavioral: Negative for confusion, sleep disturbance and agitation. The patient is nervous/anxious.        Objective:   Physical Exam   Filed Vitals:   04/14/13 0907  BP: 126/84  Pulse: 83  Temp: 98.3 F (36.8 C)  TempSrc: Oral  Height: 5\' 9"  (1.753 m)  Weight: 187 lb (84.823 kg)  SpO2: 96%    Gen: Pleasant, well-nourished, in no distress,  normal affect  ENT: No lesions,  mouth clear,  oropharynx clear, no postnasal drip  Neck: No JVD, no TMG, no carotid bruits  Lungs: No use of accessory muscles, no dullness to percussion, distant BS, scattered exp wheeze ,   Cardiovascular: RRR, heart sounds normal, no murmur or gallops, no peripheral edema  Abdomen: soft and NT, no HSM,  BS normal  Musculoskeletal: No deformities, no cyanosis or clubbing  Neuro: alert, non focal  Skin: Warm, no lesions or rashes        Assessment & Plan:   Chronic bronchitis with gold stage C. COPD and ongoing tobacco use Recurrent tracheobronchtiis with flare  Ongoing tobacco use Plan Azithromycin 250mg  Take two once then one daily until gone (sent downstairs) Pneumovax given Depo medrol 120mg  IM given Stop smoking use nicoderm step 1 patch and a referral to smoking cessation counseling with behavioral health will be made No change in other medications   Hilar adenopathy Stable hilar Left hilar adenopathy, no change over 65yrs. Benign Plan No  additional CTs needed   Nicotine abuse Nicotine addiction Plan Behavioral health referral for smoking cessation >16min smoking cessation counseling given Continue with nicotine replacement Rx    Updated Medication List Outpatient Encounter Prescriptions as of 04/14/2013  Medication Sig  . ALPRAZolam (XANAX) 0.5 MG tablet Take 1 tablet (0.5 mg total) by mouth 3 (three) times daily as needed.  Marland Kitchen aspirin 81 MG tablet Take 81 mg by mouth daily. Hold's medication prior to spinal injections  . cholecalciferol (VITAMIN D) 1000 UNITS tablet Take 2,000 Units by mouth daily.   . Fluticasone-Salmeterol (ADVAIR DISKUS) 250-50 MCG/DOSE AEPB Inhale 1 puff into the lungs 2 (two) times daily.  Marland Kitchen  folic acid (FOLVITE) 1 MG tablet Take 1 tablet (1 mg total) by mouth daily.  Marland Kitchen levothyroxine (SYNTHROID) 25 MCG tablet Take 1 tablet (25 mcg total) by mouth daily.  Marland Kitchen NIFEdipine (PROCARDIA XL/ADALAT-CC) 90 MG 24 hr tablet Take 1 tablet (90 mg total) by mouth daily.  . OMEGA-3 KRILL OIL PO Take 1 capsule by mouth daily.  . valsartan-hydrochlorothiazide (DIOVAN HCT) 320-25 MG per tablet Take 1 tablet by mouth daily.  . VENTOLIN HFA 108 (90 BASE) MCG/ACT inhaler Inhale 2 puffs into the lungs Every 6 hours as needed.  . vitamin B-12 (CYANOCOBALAMIN) 250 MCG tablet Take 250 mcg by mouth daily.  Marland Kitchen azithromycin (ZITHROMAX) 250 MG tablet Take 1 tablet (250 mg total) by mouth daily. Take two once then one daily until gone  . [DISCONTINUED] cyclobenzaprine (FLEXERIL) 10 MG tablet Take 1 tablet (10 mg total) by mouth 3 (three) times daily as needed for muscle spasms.  . [DISCONTINUED] thiamine (VITAMIN B-1) 100 MG tablet Take 1 tablet (100 mg total) by mouth daily.  . [EXPIRED] methylPREDNISolone acetate (DEPO-MEDROL) injection 120 mg

## 2013-04-14 NOTE — Assessment & Plan Note (Signed)
Recurrent tracheobronchtiis with flare  Ongoing tobacco use Plan Azithromycin 250mg  Take two once then one daily until gone (sent downstairs) Pneumovax given Depo medrol 120mg  IM given Stop smoking use nicoderm step 1 patch and a referral to smoking cessation counseling with behavioral health will be made No change in other medications

## 2013-04-14 NOTE — Assessment & Plan Note (Signed)
Stable hilar Left hilar adenopathy, no change over 65yrs. Benign Plan No additional CTs needed

## 2013-05-03 ENCOUNTER — Other Ambulatory Visit: Payer: Self-pay | Admitting: Nurse Practitioner

## 2013-08-10 ENCOUNTER — Telehealth: Payer: Self-pay | Admitting: Family Medicine

## 2013-08-12 NOTE — Telephone Encounter (Signed)
Patient aware.

## 2013-08-12 NOTE — Telephone Encounter (Signed)
NTBS.

## 2013-08-13 ENCOUNTER — Other Ambulatory Visit: Payer: Self-pay | Admitting: Family Medicine

## 2013-08-13 MED ORDER — ALPRAZOLAM 0.5 MG PO TABS: 0.5000 mg | ORAL_TABLET | Freq: Three times a day (TID) | ORAL | Status: DC | PRN

## 2013-08-20 ENCOUNTER — Ambulatory Visit (INDEPENDENT_AMBULATORY_CARE_PROVIDER_SITE_OTHER): Payer: 59 | Admitting: Family Medicine

## 2013-08-20 ENCOUNTER — Ambulatory Visit (INDEPENDENT_AMBULATORY_CARE_PROVIDER_SITE_OTHER): Payer: 59

## 2013-08-20 ENCOUNTER — Encounter: Payer: Self-pay | Admitting: Family Medicine

## 2013-08-20 VITALS — BP 132/88 | HR 79 | Temp 98.5°F | Wt 177.8 lb

## 2013-08-20 DIAGNOSIS — G2581 Restless legs syndrome: Secondary | ICD-10-CM

## 2013-08-20 DIAGNOSIS — J441 Chronic obstructive pulmonary disease with (acute) exacerbation: Secondary | ICD-10-CM

## 2013-08-20 MED ORDER — METHYLPREDNISOLONE SODIUM SUCC 125 MG IJ SOLR: 125.0000 mg | Freq: Once | INTRAMUSCULAR | Status: DC

## 2013-08-20 MED ORDER — LEVALBUTEROL HCL 1.25 MG/0.5ML IN NEBU: 1.2500 mg | INHALATION_SOLUTION | Freq: Once | RESPIRATORY_TRACT | Status: DC

## 2013-08-20 MED ORDER — AMOXICILLIN 875 MG PO TABS: 875.0000 mg | ORAL_TABLET | Freq: Two times a day (BID) | ORAL | Status: DC

## 2013-08-20 MED ORDER — SODIUM CHLORIDE 0.9 % IV SOLN: 125.0000 mg | Freq: Once | INTRAVENOUS | Status: DC

## 2013-08-20 MED ORDER — LEVALBUTEROL HCL 1.25 MG/3ML IN NEBU
1.2500 mg | INHALATION_SOLUTION | Freq: Once | RESPIRATORY_TRACT | Status: AC
  Administered 2013-08-20: 1.25 mg via RESPIRATORY_TRACT

## 2013-08-20 MED ORDER — CARBIDOPA-LEVODOPA ER 50-200 MG PO TBCR: 1.0000 | EXTENDED_RELEASE_TABLET | Freq: Two times a day (BID) | ORAL | Status: DC

## 2013-08-20 NOTE — Patient Instructions (Signed)

## 2013-08-20 NOTE — Progress Notes (Signed)
   Subjective:    Patient ID: Frank Rosales, male    DOB: [birth year], 58 y.o.   MRN: [id number]  HPI This 58 y.o. male presents for evaluation of bronchitis sx's for over a week. He has hx of COPD And has been wheezing a lot and having a lot of congestion.  He sees a pulmonologist.  He Has been having cramping and RLS sx's at night and this has caused some insomnia.   Review of Systems C/o cough, uri, and RLS sx's No chest pain, SOB, HA, dizziness, vision change, N/V, diarrhea, constipation, dysuria, urinary urgency or frequency, myalgias, arthralgias or rash.     Objective:   Physical Exam Vital signs noted  Well developed well nourished male.  HEENT - Head atraumatic Normocephalic                Eyes - PERRLA, Conjuctiva - clear Sclera- Clear EOMI                Ears - EAC's Wnl TM's Wnl Gross Hearing WNL                Throat - oropharanx wnl Respiratory - Lungs with exp wheezes bilateral Cardiac - RRR S1 and S2 without murmur GI - Abdomen soft Nontender and bowel sounds active x 4 Extremities - No edema. Neuro - Grossly intact.       Assessment & Plan:  RLS (restless legs syndrome) - Plan: carbidopa-levodopa (SINEMET CR) 50-200 MG per tablet  COPD exacerbation - Plan: methylPREDNISolone sodium succinate (SOLU-MEDROL) 130 mg in sodium chloride 0.9 % 50 mL IVPB, levalbuterol (XOPENEX) nebulizer solution 1.25 mg, amoxicillin (AMOXIL) 875 MG tablet  Push po fluids, rest, tylenol and motrin otc prn as directed for fever, arthralgias, and myalgias.  Follow up prn if sx's continue or persist.  Lysbeth Penner FNP

## 2013-10-06 ENCOUNTER — Other Ambulatory Visit: Payer: Self-pay

## 2013-10-06 DIAGNOSIS — G2581 Restless legs syndrome: Secondary | ICD-10-CM

## 2013-10-06 MED ORDER — CARBIDOPA-LEVODOPA ER 50-200 MG PO TBCR
1.0000 | EXTENDED_RELEASE_TABLET | Freq: Two times a day (BID) | ORAL | Status: DC
Start: 2013-10-06 — End: 2014-07-12

## 2013-11-19 ENCOUNTER — Encounter: Payer: Self-pay | Admitting: Nurse Practitioner

## 2013-11-19 ENCOUNTER — Ambulatory Visit (INDEPENDENT_AMBULATORY_CARE_PROVIDER_SITE_OTHER): Payer: 59

## 2013-11-19 ENCOUNTER — Ambulatory Visit (INDEPENDENT_AMBULATORY_CARE_PROVIDER_SITE_OTHER): Payer: 59 | Admitting: Nurse Practitioner

## 2013-11-19 VITALS — BP 155/64 | HR 66 | Temp 98.5°F | Ht 69.0 in | Wt 177.0 lb

## 2013-11-19 DIAGNOSIS — M542 Cervicalgia: Secondary | ICD-10-CM

## 2013-11-19 MED ORDER — CYCLOBENZAPRINE HCL 5 MG PO TABS: 5.0000 mg | ORAL_TABLET | Freq: Three times a day (TID) | ORAL | Status: DC | PRN

## 2013-11-19 MED ORDER — MELOXICAM 15 MG PO TABS: 15.0000 mg | ORAL_TABLET | Freq: Every day | ORAL | Status: DC

## 2013-11-19 NOTE — Progress Notes (Signed)
   Subjective:    Patient ID: Frank Rosales, male    DOB: 06-22-1955, 58 y.o.   MRN: [id number]  HPI Patient in today c/o neck pain  That started 2 weeks ago- has gradually gotten worse. Now it hurts to move it in any direction. Worse in the mornings and gets some better later in the day- Pain radiates to his right shoulder. Rates pain 4-8/10. Has not taken anything OTC.    Review of Systems  Constitutional: Negative.   HENT: Negative.   Respiratory: Negative.   Cardiovascular: Negative.   Musculoskeletal: Positive for myalgias and neck pain.  Neurological: Negative.   Psychiatric/Behavioral: Negative.   All other systems reviewed and are negative.      Objective:   Physical Exam  Constitutional: He is oriented to person, place, and time. He appears well-developed and well-nourished. He appears distressed.  Cardiovascular: Normal rate, regular rhythm and normal heart sounds.   Pulmonary/Chest: Effort normal and breath sounds normal.  Musculoskeletal:  Decreased ROM of cervical spine due to pain on rotation to the right. Grips equal bilaterally  Neurological: He is alert and oriented to person, place, and time. He has normal reflexes. No cranial nerve deficit.  Skin: Skin is warm.  Psychiatric: He has a normal mood and affect. His behavior is normal. Judgment and thought content normal.   BP 155/64  Pulse 66  Temp(Src) 98.5 F (36.9 C) (Oral)  Ht 5\' 9"  (1.753 m)  Wt 177 lb (80.287 kg)  BMI 26.13 kg/m2   Cervical spine x ray- Joint space narrowing at c2-3 and c3-4- Degenerative changes  Noted as well-Preliminary reading by Ronnald Collum, FNP  [facility]        Assessment & Plan:   1. Neck pain    Meds ordered this encounter  Medications  . cyclobenzaprine (FLEXERIL) 5 MG tablet    Sig: Take 1 tablet (5 mg total) by mouth 3 (three) times daily as needed for muscle spasms.    Dispense:  30 tablet    Refill:  1    Order Specific Question:  Supervising Provider    Answer:   Chipper Herb [[id number]]  . meloxicam (MOBIC) 15 MG tablet    Sig: Take 1 tablet (15 mg total) by mouth daily.    Dispense:  30 tablet    Refill:  5    Order Specific Question:  Supervising Provider    Answer:  Chipper Herb [[id number]]   Moist heat No heavy lifting RTO for routine follow up in 2 weeks and we will see then if getting better  Mary-Margaret Hassell Done, FNP

## 2013-11-19 NOTE — Patient Instructions (Signed)

## 2013-12-03 ENCOUNTER — Ambulatory Visit (INDEPENDENT_AMBULATORY_CARE_PROVIDER_SITE_OTHER): Payer: 59 | Admitting: Nurse Practitioner

## 2013-12-03 ENCOUNTER — Encounter: Payer: Self-pay | Admitting: Nurse Practitioner

## 2013-12-03 VITALS — BP 138/84 | HR 92 | Temp 98.1°F | Ht 69.0 in | Wt 177.0 lb

## 2013-12-03 DIAGNOSIS — Z6826 Body mass index (BMI) 26.0-26.9, adult: Secondary | ICD-10-CM

## 2013-12-03 DIAGNOSIS — I10 Essential (primary) hypertension: Secondary | ICD-10-CM

## 2013-12-03 DIAGNOSIS — F17200 Nicotine dependence, unspecified, uncomplicated: Secondary | ICD-10-CM

## 2013-12-03 DIAGNOSIS — E039 Hypothyroidism, unspecified: Secondary | ICD-10-CM | POA: Insufficient documentation

## 2013-12-03 DIAGNOSIS — M899 Disorder of bone, unspecified: Secondary | ICD-10-CM

## 2013-12-03 DIAGNOSIS — J449 Chronic obstructive pulmonary disease, unspecified: Secondary | ICD-10-CM

## 2013-12-03 DIAGNOSIS — E7800 Pure hypercholesterolemia, unspecified: Secondary | ICD-10-CM

## 2013-12-03 DIAGNOSIS — Z720 Tobacco use: Secondary | ICD-10-CM

## 2013-12-03 DIAGNOSIS — M949 Disorder of cartilage, unspecified: Secondary | ICD-10-CM

## 2013-12-03 DIAGNOSIS — Z713 Dietary counseling and surveillance: Secondary | ICD-10-CM

## 2013-12-03 DIAGNOSIS — J4489 Other specified chronic obstructive pulmonary disease: Secondary | ICD-10-CM

## 2013-12-03 DIAGNOSIS — M8580 Other specified disorders of bone density and structure, unspecified site: Secondary | ICD-10-CM

## 2013-12-03 NOTE — Progress Notes (Signed)
   Subjective:    Patient ID: Frank Rosales, male    DOB: November 23, 1955, 58 y.o.   MRN: [id number]  Patient is here today for chronic disease follow up. No acute complaint.   Hypertension This is a chronic problem. The current episode started more than 1 year ago. Associated symptoms include neck pain. Risk factors for coronary artery disease include dyslipidemia, male gender, smoking/tobacco exposure and family history. Past treatments include calcium channel blockers and angiotensin blockers. The current treatment provides moderate improvement. There are no compliance problems.  Identifiable causes of hypertension include hyperaldosteronism.  Hyperlipidemia This is a chronic problem. The current episode started more than 1 year ago. Factors aggravating his hyperlipidemia include smoking. Treatments tried: krill oil.  Risk factors for coronary artery disease include male sex and hypertension.  COPD: patient taking advair and reports it helps.  HYPOTHYROIDISM: patient is taking levothyroxine 37mg.   *he reports seeing a neuro surgeon for his back and next appointment is 7/24.   *follow up for neck pain, he was here 7/9 and was prescribed meloxicam and flexeril. He reports doing better since taking the medication.    Review of Systems  Constitutional: Negative.   HENT: Negative.   Eyes: Negative.   Respiratory: Negative.   Cardiovascular: Negative.   Gastrointestinal: Negative.   Endocrine: Negative.   Genitourinary: Negative.   Musculoskeletal: Positive for neck pain.  Allergic/Immunologic: Negative.   Neurological: Negative.   Hematological: Negative.   Psychiatric/Behavioral: Negative.        Objective:   Physical Exam  Constitutional: He is oriented to person, place, and time. He appears well-developed and well-nourished.  HENT:  Head: Normocephalic.  Right Ear: External ear normal.  Left Ear: External ear normal.  Nose: Nose normal.  Mouth/Throat: Oropharynx is clear and  moist.  Eyes: EOM are normal. Pupils are equal, round, and reactive to light.  Neck: Normal range of motion. Neck supple. No JVD present. No thyromegaly present.  Cardiovascular: Normal rate, regular rhythm, normal heart sounds and intact distal pulses.  Exam reveals no gallop and no friction rub.   No murmur heard. Pulmonary/Chest: Effort normal. No respiratory distress. He has wheezes (inspiratory. ). He has no rales. He exhibits no tenderness.  Abdominal: Soft. Bowel sounds are normal. He exhibits no mass. There is no tenderness.  Genitourinary: Prostate normal and penis normal.  Musculoskeletal: Normal range of motion. He exhibits no edema.  Lymphadenopathy:    He has no cervical adenopathy.  Neurological: He is alert and oriented to person, place, and time. No cranial nerve deficit.  Skin: Skin is warm and dry.  Psychiatric: He has a normal mood and affect. His behavior is normal. Judgment and thought content normal.    BP 138/84  Pulse 92  Temp(Src) 98.1 F (36.7 C) (Oral)  Ht 5' 9" (1.753 m)  Wt 177 lb (80.287 kg)  BMI 26.13 kg/m2        Assessment & Plan:   1. High cholesterol   2. Essential hypertension   3. Chronic bronchitis with gold stage C. COPD and ongoing tobacco use   4. Osteopenia    Orders Placed This Encounter  Procedures  . CMP14+EGFR  . NMR, lipoprofile  . Thyroid Panel With TSH    Labs pending Health maintenance reviewed Diet and exercise encouraged Encourage smoking cessation  Continue all meds Follow up  In 3 months    MBrook FNP

## 2013-12-03 NOTE — Patient Instructions (Signed)
Smoking Cessation Quitting smoking is important to your health and has many advantages. However, it is not always easy to quit since nicotine is a very addictive drug. Oftentimes, people try 3 times or more before being able to quit. This document explains the best ways for you to prepare to quit smoking. Quitting takes hard work and a lot of effort, but you can do it. ADVANTAGES OF QUITTING SMOKING  You will live longer, feel better, and live better.  Your body will feel the impact of quitting smoking almost immediately.  Within 20 minutes, blood pressure decreases. Your pulse returns to its normal level.  After 8 hours, carbon monoxide levels in the blood return to normal. Your oxygen level increases.  After 24 hours, the chance of having a heart attack starts to decrease. Your breath, hair, and body stop smelling like smoke.  After 48 hours, damaged nerve endings begin to recover. Your sense of taste and smell improve.  After 72 hours, the body is virtually free of nicotine. Your bronchial tubes relax and breathing becomes easier.  After 2 to 12 weeks, lungs can hold more air. Exercise becomes easier and circulation improves.  The risk of having a heart attack, stroke, cancer, or lung disease is greatly reduced.  After 1 year, the risk of coronary heart disease is cut in half.  After 5 years, the risk of stroke falls to the same as a nonsmoker.  After 10 years, the risk of lung cancer is cut in half and the risk of other cancers decreases significantly.  After 15 years, the risk of coronary heart disease drops, usually to the level of a nonsmoker.  If you are pregnant, quitting smoking will improve your chances of having a healthy baby.  The people you live with, especially any children, will be healthier.  You will have extra money to spend on things other than cigarettes. QUESTIONS TO THINK ABOUT BEFORE ATTEMPTING TO QUIT You may want to talk about your answers with your  health care provider.  Why do you want to quit?  If you tried to quit in the past, what helped and what did not?  What will be the most difficult situations for you after you quit? How will you plan to handle them?  Who can help you through the tough times? Your family? Friends? A health care provider?  What pleasures do you get from smoking? What ways can you still get pleasure if you quit? Here are some questions to ask your health care provider:  How can you help me to be successful at quitting?  What medicine do you think would be best for me and how should I take it?  What should I do if I need more help?  What is smoking withdrawal like? How can I get information on withdrawal? GET READY  Set a quit date.  Change your environment by getting rid of all cigarettes, ashtrays, matches, and lighters in your home, car, or work. Do not let people smoke in your home.  Review your past attempts to quit. Think about what worked and what did not. GET SUPPORT AND ENCOURAGEMENT You have a better chance of being successful if you have help. You can get support in many ways.  Tell your family, friends, and coworkers that you are going to quit and need their support. Ask them not to smoke around you.  Get individual, group, or telephone counseling and support. Programs are available at local hospitals and health centers. Call   your local health department for information about programs in your area.  Spiritual beliefs and practices may help some smokers quit.  Download a "quit meter" on your computer to keep track of quit statistics, such as how long you have gone without smoking, cigarettes not smoked, and money saved.  Get a self-help book about quitting smoking and staying off tobacco. LEARN NEW SKILLS AND BEHAVIORS  Distract yourself from urges to smoke. Talk to someone, go for a walk, or occupy your time with a task.  Change your normal routine. Take a different route to work.  Drink tea instead of coffee. Eat breakfast in a different place.  Reduce your stress. Take a hot bath, exercise, or read a book.  Plan something enjoyable to do every day. Reward yourself for not smoking.  Explore interactive web-based programs that specialize in helping you quit. GET MEDICINE AND USE IT CORRECTLY Medicines can help you stop smoking and decrease the urge to smoke. Combining medicine with the above behavioral methods and support can greatly increase your chances of successfully quitting smoking.  Nicotine replacement therapy helps deliver nicotine to your body without the negative effects and risks of smoking. Nicotine replacement therapy includes nicotine gum, lozenges, inhalers, nasal sprays, and skin patches. Some may be available over-the-counter and others require a prescription.  Antidepressant medicine helps people abstain from smoking, but how this works is unknown. This medicine is available by prescription.  Nicotinic receptor partial agonist medicine simulates the effect of nicotine in your brain. This medicine is available by prescription. Ask your health care provider for advice about which medicines to use and how to use them based on your health history. Your health care provider will tell you what side effects to look out for if you choose to be on a medicine or therapy. Carefully read the information on the package. Do not use any other product containing nicotine while using a nicotine replacement product.  RELAPSE OR DIFFICULT SITUATIONS Most relapses occur within the first 3 months after quitting. Do not be discouraged if you start smoking again. Remember, most people try several times before finally quitting. You may have symptoms of withdrawal because your body is used to nicotine. You may crave cigarettes, be irritable, feel very hungry, cough often, get headaches, or have difficulty concentrating. The withdrawal symptoms are only temporary. They are strongest  when you first quit, but they will go away within 10-14 days. To reduce the chances of relapse, try to:  Avoid drinking alcohol. Drinking lowers your chances of successfully quitting.  Reduce the amount of caffeine you consume. Once you quit smoking, the amount of caffeine in your body increases and can give you symptoms, such as a rapid heartbeat, sweating, and anxiety.  Avoid smokers because they can make you want to smoke.  Do not let weight gain distract you. Many smokers will gain weight when they quit, usually less than 10 pounds. Eat a healthy diet and stay active. You can always lose the weight gained after you quit.  Find ways to improve your mood other than smoking. FOR MORE INFORMATION  www.smokefree.gov  Document Released: 04/24/2001 Document Revised: 09/14/2013 Document Reviewed: 08/09/2011 [employer] Patient Information 2015 [employer]. This information is not intended to replace advice given to you by your health care provider. Make sure you discuss any questions you have with your health care provider.  

## 2013-12-04 LAB — THYROID PANEL WITH TSH
Free Thyroxine Index: 2.2 (ref 1.2–4.9)
T3 Uptake Ratio: 30 % (ref 24–39)
T4, Total: 7.3 ug/dL (ref 4.5–12.0)
TSH: 2.23 u[IU]/mL (ref 0.450–4.500)

## 2013-12-04 LAB — CMP14+EGFR
ALBUMIN: 4.9 g/dL (ref 3.5–5.5)
ALT: 29 IU/L (ref 0–44)
AST: 28 IU/L (ref 0–40)
Albumin/Globulin Ratio: 2.1 (ref 1.1–2.5)
Alkaline Phosphatase: 69 IU/L (ref 39–117)
BILIRUBIN TOTAL: 0.6 mg/dL (ref 0.0–1.2)
BUN / CREAT RATIO: 7 — AB (ref 9–20)
BUN: 6 mg/dL (ref 6–24)
CO2: 24 mmol/L (ref 18–29)
Calcium: 10.2 mg/dL (ref 8.7–10.2)
Chloride: 95 mmol/L — ABNORMAL LOW (ref 97–108)
Creatinine, Ser: 0.91 mg/dL (ref 0.76–1.27)
GFR calc Af Amer: 109 mL/min/{1.73_m2} (ref >59)
GFR calc non Af Amer: 95 mL/min/{1.73_m2} (ref >59)
Globulin, Total: 2.3 g/dL (ref 1.5–4.5)
Glucose: 100 mg/dL — ABNORMAL HIGH (ref 65–99)
POTASSIUM: 4.4 mmol/L (ref 3.5–5.2)
Sodium: 136 mmol/L (ref 134–144)
Total Protein: 7.2 g/dL (ref 6.0–8.5)

## 2013-12-04 LAB — NMR, LIPOPROFILE
CHOLESTEROL: 220 mg/dL — AB (ref 100–199)
HDL Cholesterol by NMR: 75 mg/dL (ref >39)
HDL Particle Number: 38.3 umol/L (ref >=30.5)
LDL Particle Number: 1027 nmol/L — ABNORMAL HIGH (ref <1000)
LDL SIZE: 21.1 nm (ref >20.5)
LDLC SERPL CALC-MCNC: 125 mg/dL — ABNORMAL HIGH (ref 0–99)
LP-IR Score: 37 (ref <=45)
Small LDL Particle Number: 313 nmol/L (ref <=527)
Triglycerides by NMR: 99 mg/dL (ref 0–149)

## 2013-12-08 ENCOUNTER — Other Ambulatory Visit: Payer: Self-pay | Admitting: Nurse Practitioner

## 2013-12-08 DIAGNOSIS — I6523 Occlusion and stenosis of bilateral carotid arteries: Secondary | ICD-10-CM

## 2013-12-18 ENCOUNTER — Ambulatory Visit (HOSPITAL_COMMUNITY)
Admission: RE | Admit: 2013-12-18 | Discharge: 2013-12-18 | Disposition: A | Discharge status: Home / Self Care | Payer: 59 | Source: Ambulatory Visit | Attending: Nurse Practitioner | Admitting: Nurse Practitioner

## 2013-12-18 DIAGNOSIS — I6523 Occlusion and stenosis of bilateral carotid arteries: Secondary | ICD-10-CM

## 2013-12-18 DIAGNOSIS — I6529 Occlusion and stenosis of unspecified carotid artery: Secondary | ICD-10-CM | POA: Insufficient documentation

## 2014-01-27 ENCOUNTER — Other Ambulatory Visit: Payer: Self-pay | Admitting: Family Medicine

## 2014-03-23 ENCOUNTER — Other Ambulatory Visit: Payer: Self-pay | Admitting: Family Medicine

## 2014-03-31 ENCOUNTER — Other Ambulatory Visit: Payer: Self-pay | Admitting: Family Medicine

## 2014-04-20 ENCOUNTER — Encounter: Payer: Self-pay | Admitting: Family Medicine

## 2014-04-20 ENCOUNTER — Ambulatory Visit (INDEPENDENT_AMBULATORY_CARE_PROVIDER_SITE_OTHER): Payer: 59 | Admitting: Family Medicine

## 2014-04-20 VITALS — BP 124/81 | HR 91 | Temp 98.1°F | Ht 69.0 in | Wt 173.8 lb

## 2014-04-20 DIAGNOSIS — Z9109 Other allergy status, other than to drugs and biological substances: Secondary | ICD-10-CM

## 2014-04-20 DIAGNOSIS — F411 Generalized anxiety disorder: Secondary | ICD-10-CM

## 2014-04-20 DIAGNOSIS — Z91048 Other nonmedicinal substance allergy status: Secondary | ICD-10-CM

## 2014-04-20 DIAGNOSIS — I10 Essential (primary) hypertension: Secondary | ICD-10-CM

## 2014-04-20 DIAGNOSIS — J069 Acute upper respiratory infection, unspecified: Secondary | ICD-10-CM

## 2014-04-20 MED ORDER — ALBUTEROL SULFATE HFA 108 (90 BASE) MCG/ACT IN AERS: 2.0000 | INHALATION_SPRAY | Freq: Four times a day (QID) | RESPIRATORY_TRACT | Status: DC | PRN

## 2014-04-20 MED ORDER — FLUTICASONE-SALMETEROL 250-50 MCG/DOSE IN AEPB: INHALATION_SPRAY | RESPIRATORY_TRACT | Status: DC

## 2014-04-20 MED ORDER — ALPRAZOLAM 0.5 MG PO TABS: 0.5000 mg | ORAL_TABLET | Freq: Three times a day (TID) | ORAL | Status: DC | PRN

## 2014-04-20 MED ORDER — VALSARTAN-HYDROCHLOROTHIAZIDE 320-25 MG PO TABS: 1.0000 | ORAL_TABLET | Freq: Every day | ORAL | Status: DC

## 2014-04-20 MED ORDER — SERTRALINE HCL 50 MG PO TABS: 50.0000 mg | ORAL_TABLET | Freq: Every day | ORAL | Status: DC

## 2014-04-20 MED ORDER — AMOXICILLIN 875 MG PO TABS: 875.0000 mg | ORAL_TABLET | Freq: Two times a day (BID) | ORAL | Status: DC

## 2014-04-20 NOTE — Progress Notes (Signed)
   Subjective:    Patient ID: Frank Rosales, male    DOB: [birth year], 58 y.o.   MRN: [id number]  HPI Patient is here for follow up appointment. He has hx of hypertension, hypothyroidism, hyperlipidemia, copd, and b12 deficiency.  He is coughing and he has been experiencing some anxiety.  He notices he gets nervous more often.  Review of Systems  Constitutional: Negative for fever.  HENT: Negative for ear pain.   Eyes: Negative for discharge.  Respiratory: Negative for cough.   Cardiovascular: Negative for chest pain.  Gastrointestinal: Negative for abdominal distention.  Endocrine: Negative for polyuria.  Genitourinary: Negative for difficulty urinating.  Musculoskeletal: Negative for gait problem and neck pain.  Skin: Negative for color change and rash.  Neurological: Negative for speech difficulty and headaches.  Psychiatric/Behavioral: Negative for agitation.       Objective:    BP 124/81 mmHg  Pulse 91  Temp(Src) 98.1 F (36.7 C) (Oral)  Ht 5\' 9"  (1.753 m)  Wt 173 lb 12.8 oz (78.835 kg)  BMI 25.65 kg/m2 Physical Exam  Constitutional: He is oriented to person, place, and time. He appears well-developed and well-nourished.  HENT:  Head: Normocephalic and atraumatic.  Mouth/Throat: Oropharynx is clear and moist.  Eyes: Pupils are equal, round, and reactive to light.  Neck: Normal range of motion. Neck supple.  Cardiovascular: Normal rate and regular rhythm.   No murmur heard. Pulmonary/Chest: Effort normal and breath sounds normal.  Abdominal: Soft. Bowel sounds are normal. There is no tenderness.  Neurological: He is alert and oriented to person, place, and time.  Skin: Skin is warm and dry.  Psychiatric: He has a normal mood and affect.          Assessment & Plan:     ICD-9-CM ICD-10-CM   1. Generalized anxiety disorder 300.02 F41.1 ALPRAZolam (XANAX) 0.5 MG tablet     sertraline (ZOLOFT) 50 MG tablet  2. Essential hypertension 401.9 I10  valsartan-hydrochlorothiazide (DIOVAN-HCT) 320-25 MG per tablet  3. Environmental allergies V15.09 Z91.048 Fluticasone-Salmeterol (ADVAIR DISKUS) 250-50 MCG/DOSE AEPB  4. URI (upper respiratory infection) 465.9 J06.9 amoxicillin (AMOXIL) 875 MG tablet     Return in about 3 months (around 07/20/2014).  Lysbeth Penner FNP

## 2014-05-11 ENCOUNTER — Other Ambulatory Visit: Payer: Self-pay | Admitting: Family Medicine

## 2014-06-27 ENCOUNTER — Other Ambulatory Visit: Payer: Self-pay | Admitting: Nurse Practitioner

## 2014-07-01 ENCOUNTER — Ambulatory Visit (INDEPENDENT_AMBULATORY_CARE_PROVIDER_SITE_OTHER): Payer: 59

## 2014-07-01 ENCOUNTER — Ambulatory Visit (INDEPENDENT_AMBULATORY_CARE_PROVIDER_SITE_OTHER): Payer: 59 | Admitting: Nurse Practitioner

## 2014-07-01 ENCOUNTER — Encounter: Payer: Self-pay | Admitting: Nurse Practitioner

## 2014-07-01 ENCOUNTER — Other Ambulatory Visit: Payer: Self-pay | Admitting: Nurse Practitioner

## 2014-07-01 VITALS — BP 143/95 | HR 81 | Temp 99.0°F | Ht 69.0 in | Wt 176.0 lb

## 2014-07-01 DIAGNOSIS — R0989 Other specified symptoms and signs involving the circulatory and respiratory systems: Secondary | ICD-10-CM

## 2014-07-01 DIAGNOSIS — F411 Generalized anxiety disorder: Secondary | ICD-10-CM

## 2014-07-01 DIAGNOSIS — R05 Cough: Secondary | ICD-10-CM

## 2014-07-01 DIAGNOSIS — J209 Acute bronchitis, unspecified: Secondary | ICD-10-CM

## 2014-07-01 DIAGNOSIS — R059 Cough, unspecified: Secondary | ICD-10-CM

## 2014-07-01 MED ORDER — BENZONATATE 100 MG PO CAPS: 100.0000 mg | ORAL_CAPSULE | Freq: Two times a day (BID) | ORAL | Status: DC | PRN

## 2014-07-01 MED ORDER — AMOXICILLIN 875 MG PO TABS: 875.0000 mg | ORAL_TABLET | Freq: Two times a day (BID) | ORAL | Status: DC

## 2014-07-01 MED ORDER — ESCITALOPRAM OXALATE 20 MG PO TABS: 20.0000 mg | ORAL_TABLET | Freq: Every day | ORAL | Status: DC

## 2014-07-01 NOTE — Patient Instructions (Signed)

## 2014-07-01 NOTE — Progress Notes (Signed)
  Subjective:     Frank Rosales is a 59 y.o. male who presents for evaluation of symptoms of a URI. Symptoms include congestion, coryza, facial pain, low grade fever, nasal congestion, non productive cough, post nasal drip and purulent nasal discharge. Onset of symptoms was 3 days ago, and has been gradually worsening since that time. Treatment to date: cough suppressants and decongestants.  * also wants to discuss zoloft- he was put on it for anxiety ffrom work- he has been on it for 91month and he can tell no difference at all.  The following portions of the patient's history were reviewed and updated as appropriate: allergies, current medications, past family history, past medical history, past social history, past surgical history and problem list.  Review of Systems Pertinent items are noted in HPI.   Objective:    BP 143/95 mmHg  Pulse 81  Temp(Src) 99 F (37.2 C) (Oral)  Ht 5\' 9"  (1.753 m)  Wt 176 lb (79.833 kg)  BMI 25.98 kg/m2 General appearance: alert and cooperative Head: Normocephalic, without obvious abnormality, atraumatic Eyes: conjunctivae/corneas clear. PERRL, EOM's intact. Fundi benign. Ears: normal TM's and external ear canals both ears Nose: Nares normal. Septum midline. Mucosa normal. No drainage or sinus tenderness., no sinus tenderness Throat: lips, mucosa, and tongue normal; teeth and gums normal Neck: no adenopathy, no carotid bruit, no JVD, supple, symmetrical, trachea midline and thyroid not enlarged, symmetric, no tenderness/mass/nodules Lungs: wheezes bilaterally Heart: regular rate and rhythm, S1, S2 normal, no murmur, click, rub or gallop Skin: Skin color, texture, turgor normal. No rashes or lesions   Assessment:       1. Chest congestion   2. Acute bronchitis, unspecified organism   3. GAD (generalized anxiety disorder)      Plan:      1. Take meds as prescribed 2. Use a cool mist humidifier especially during the winter months and when heat  has been humid. 3. Use saline nose sprays frequently 4. Saline irrigations of the nose can be very helpful if done frequently.  * 4X daily for 1 week*  * Use of a nettie pot can be helpful with this. Follow directions with this* 5. Drink plenty of fluids 6. Keep thermostat turn down low 7.For any cough or congestion  Use plain Mucinex- regular strength or max strength is fine   * Children- consult with Pharmacist for dosing 8. For fever or aces or pains- take tylenol or ibuprofen appropriate for age and weight.  * for fevers greater than 101 orally you may alternate ibuprofen and tylenol every  3 hours.  Smoking cessation encouraged  Stress management Changed zoloft to lexapro RTO prn  Mary-Margaret Hassell Done, FNP

## 2014-07-12 ENCOUNTER — Encounter: Payer: Self-pay | Admitting: Family Medicine

## 2014-07-12 ENCOUNTER — Ambulatory Visit (INDEPENDENT_AMBULATORY_CARE_PROVIDER_SITE_OTHER): Payer: 59 | Admitting: Family Medicine

## 2014-07-12 ENCOUNTER — Ambulatory Visit (INDEPENDENT_AMBULATORY_CARE_PROVIDER_SITE_OTHER): Payer: 59

## 2014-07-12 ENCOUNTER — Ambulatory Visit: Payer: 59 | Admitting: Family

## 2014-07-12 VITALS — BP 126/84 | HR 99 | Temp 98.6°F | Wt 171.0 lb

## 2014-07-12 DIAGNOSIS — R05 Cough: Secondary | ICD-10-CM | POA: Diagnosis not present

## 2014-07-12 DIAGNOSIS — R059 Cough, unspecified: Secondary | ICD-10-CM

## 2014-07-12 LAB — POCT CBC
Granulocyte percent: 75.3 %G (ref 37–80)
HEMATOCRIT: 50.4 % (ref 43.5–53.7)
Hemoglobin: 16.4 g/dL (ref 14.1–18.1)
LYMPH, POC: 1.7 (ref 0.6–3.4)
MCH: 33 pg — AB (ref 27–31.2)
MCHC: 32.6 g/dL (ref 31.8–35.4)
MCV: 101.3 fL — AB (ref 80–97)
MPV: 6 fL (ref 0–99.8)
POC Granulocyte: 7.3 — AB (ref 2–6.9)
POC LYMPH %: 17.6 % (ref 10–50)
Platelet Count, POC: 281 10*3/uL (ref 142–424)
RBC: 4.98 M/uL (ref 4.69–6.13)
RDW, POC: 13.7 %
WBC: 9.7 10*3/uL (ref 4.6–10.2)

## 2014-07-12 MED ORDER — PREDNISONE 20 MG PO TABS: ORAL_TABLET | ORAL | Status: DC

## 2014-07-12 MED ORDER — LEVOFLOXACIN 500 MG PO TABS: 500.0000 mg | ORAL_TABLET | Freq: Every day | ORAL | Status: DC

## 2014-07-12 NOTE — Progress Notes (Signed)
Subjective:    Patient ID: Frank Rosales, male    DOB: Sep 30, 1955, 59 y.o.   MRN: [id number]  HPI  59 year old man with COPD and acute exacerbation of same. He was treated about 2 weeks ago with amoxicillin and Tessalon and his symptoms have not resolved. If anything, he feels that mucus is thicker. He has some feelings of malaise and tiredness probably related to the infection. Maintenance medications include Advair inhaler and albuterol as needed, but he has not used albuterol recently because he has not felt the need. He denies fever or chills  Patient Active Problem List   Diagnosis Date Noted  . GAD (generalized anxiety disorder) 07/01/2014  . Unspecified hypothyroidism 12/03/2013  . Osteopenia 03/11/2013  . Nicotine abuse 07/12/2011  . High blood pressure   . Chronic bronchitis with gold stage C. COPD and ongoing tobacco use   . High cholesterol   . Hilar adenopathy    Outpatient Encounter Prescriptions as of 07/12/2014  Medication Sig  . albuterol (PROAIR HFA) 108 (90 BASE) MCG/ACT inhaler Inhale 2 puffs into the lungs every 6 (six) hours as needed for wheezing or shortness of breath.  . ALPRAZolam (XANAX) 0.5 MG tablet Take 1 tablet (0.5 mg total) by mouth 3 (three) times daily as needed.  Marland Kitchen aspirin 81 MG tablet Take 81 mg by mouth daily. Hold's medication prior to spinal injections  . cholecalciferol (VITAMIN D) 1000 UNITS tablet Take 2,000 Units by mouth daily.   Marland Kitchen escitalopram (LEXAPRO) 20 MG tablet Take 1 tablet (20 mg total) by mouth daily.  . Fluticasone-Salmeterol (ADVAIR DISKUS) 250-50 MCG/DOSE AEPB INHALE 1 PUFF 2 TIMES DAILY  . folic acid (FOLVITE) 1 MG tablet Take 1 tablet (1 mg total) by mouth daily.  Marland Kitchen levothyroxine (SYNTHROID, LEVOTHROID) 25 MCG tablet TAKE 1 TABLET (25 MCG TOTAL) BY MOUTH DAILY.  Marland Kitchen NIFEdipine (PROCARDIA XL/ADALAT-CC) 90 MG 24 hr tablet TAKE 1 TABLET (90 MG TOTAL) BY MOUTH DAILY.  Marland Kitchen OMEGA-3 KRILL OIL PO Take 1 capsule by mouth daily.  .  valsartan-hydrochlorothiazide (DIOVAN-HCT) 320-25 MG per tablet TAKE 1 TABLET BY MOUTH DAILY.  . vitamin B-12 (CYANOCOBALAMIN) 250 MCG tablet Take 250 mcg by mouth daily.  . [DISCONTINUED] benzonatate (TESSALON) 100 MG capsule Take 1 capsule (100 mg total) by mouth 2 (two) times daily as needed for cough.  . [DISCONTINUED] carbidopa-levodopa (SINEMET CR) 50-200 MG per tablet Take 1 tablet by mouth 2 (two) times daily.  . cyclobenzaprine (FLEXERIL) 5 MG tablet Take 1 tablet (5 mg total) by mouth 3 (three) times daily as needed for muscle spasms. (Patient not taking: Reported on 07/12/2014)  . meloxicam (MOBIC) 15 MG tablet Take 1 tablet (15 mg total) by mouth daily. (Patient not taking: Reported on 07/12/2014)  . [DISCONTINUED] amoxicillin (AMOXIL) 875 MG tablet Take 1 tablet (875 mg total) by mouth 2 (two) times daily. 1 po BID  . [DISCONTINUED] sertraline (ZOLOFT) 50 MG tablet Take 1 tablet (50 mg total) by mouth daily.  . [DISCONTINUED] methylPREDNISolone sodium succinate (SOLU-MEDROL) 125 mg/2 mL injection 125 mg      Review of Systems  Constitutional: Positive for fatigue.  HENT: Negative.   Eyes: Positive for visual disturbance.  Respiratory: Positive for cough. Negative for shortness of breath.   Cardiovascular: Negative.  Negative for chest pain and leg swelling.  Gastrointestinal: Negative.   Genitourinary: Negative.   Musculoskeletal: Negative.   Skin: Negative.   Neurological: Negative.   Psychiatric/Behavioral: Negative.   All other systems reviewed  and are negative.      Objective:   Physical Exam  Constitutional: He appears well-developed.  HENT:  Head: Normocephalic.  Mouth/Throat: Oropharynx is clear and moist.  Cardiovascular: Normal rate and regular rhythm.   Pulmonary/Chest: Effort normal. He has wheezes.  Chest x-ray shows no change from previous study 2 weeks ago    BP 126/84 mmHg  Pulse 99  Temp(Src) 98.6 F (37 C) (Oral)  Wt 171 lb (77.565 kg)  SpO2  94%        Assessment & Plan:  1. Cough Exacerbation of COPD - DG Chest 2 View; Future - BMP8+EGFR - POCT CBC Rx: Levaquin 500 mg x 5 days and prednisone 60,60, and taper over 6 days  Wardell Honour MD

## 2014-07-13 LAB — BMP8+EGFR
BUN/Creatinine Ratio: 12 (ref 9–20)
BUN: 10 mg/dL (ref 6–24)
CALCIUM: 9.4 mg/dL (ref 8.7–10.2)
CHLORIDE: 94 mmol/L — AB (ref 97–108)
CO2: 23 mmol/L (ref 18–29)
Creatinine, Ser: 0.82 mg/dL (ref 0.76–1.27)
GFR, EST AFRICAN AMERICAN: 114 mL/min/{1.73_m2} (ref >59)
GFR, EST NON AFRICAN AMERICAN: 99 mL/min/{1.73_m2} (ref >59)
Glucose: 112 mg/dL — ABNORMAL HIGH (ref 65–99)
Potassium: 4.1 mmol/L (ref 3.5–5.2)
SODIUM: 134 mmol/L (ref 134–144)

## 2014-07-22 ENCOUNTER — Other Ambulatory Visit: Payer: Self-pay | Admitting: Nurse Practitioner

## 2014-08-18 ENCOUNTER — Ambulatory Visit: Payer: 59 | Admitting: Family

## 2014-09-21 ENCOUNTER — Other Ambulatory Visit: Payer: Self-pay | Admitting: *Deleted

## 2014-09-21 DIAGNOSIS — F411 Generalized anxiety disorder: Secondary | ICD-10-CM

## 2014-09-21 MED ORDER — ESCITALOPRAM OXALATE 20 MG PO TABS: 20.0000 mg | ORAL_TABLET | Freq: Every day | ORAL | Status: DC

## 2014-09-27 ENCOUNTER — Other Ambulatory Visit: Payer: Self-pay | Admitting: Family Medicine

## 2014-10-18 ENCOUNTER — Inpatient Hospital Stay (HOSPITAL_COMMUNITY)
Admission: AD | Admit: 2014-10-18 | Discharge: 2014-10-21 | DRG: 190 | Disposition: A | Discharge status: Home / Self Care | Payer: 59 | Source: Ambulatory Visit | Attending: Internal Medicine | Admitting: Internal Medicine

## 2014-10-18 ENCOUNTER — Ambulatory Visit: Payer: 59 | Admitting: Family Medicine

## 2014-10-18 ENCOUNTER — Other Ambulatory Visit: Payer: Self-pay | Admitting: *Deleted

## 2014-10-18 ENCOUNTER — Encounter (HOSPITAL_COMMUNITY): Payer: Self-pay | Admitting: *Deleted

## 2014-10-18 ENCOUNTER — Ambulatory Visit (INDEPENDENT_AMBULATORY_CARE_PROVIDER_SITE_OTHER): Payer: 59 | Admitting: Family Medicine

## 2014-10-18 ENCOUNTER — Inpatient Hospital Stay: Admission: AD | Admit: 2014-10-18 | Payer: Self-pay | Admitting: Internal Medicine

## 2014-10-18 ENCOUNTER — Ambulatory Visit: Payer: 59 | Admitting: Physician Assistant

## 2014-10-18 ENCOUNTER — Encounter (HOSPITAL_COMMUNITY): Payer: Self-pay | Admitting: General Practice

## 2014-10-18 ENCOUNTER — Ambulatory Visit (INDEPENDENT_AMBULATORY_CARE_PROVIDER_SITE_OTHER): Payer: 59

## 2014-10-18 ENCOUNTER — Encounter: Payer: Self-pay | Admitting: Family Medicine

## 2014-10-18 ENCOUNTER — Emergency Department (HOSPITAL_COMMUNITY): Admission: EM | Admit: 2014-10-18 | Payer: 59 | Source: Home / Self Care

## 2014-10-18 VITALS — BP 129/85 | HR 101 | Temp 98.3°F | Ht 69.0 in | Wt 170.0 lb

## 2014-10-18 DIAGNOSIS — R55 Syncope and collapse: Secondary | ICD-10-CM | POA: Diagnosis present

## 2014-10-18 DIAGNOSIS — J189 Pneumonia, unspecified organism: Secondary | ICD-10-CM | POA: Diagnosis present

## 2014-10-18 DIAGNOSIS — F411 Generalized anxiety disorder: Secondary | ICD-10-CM | POA: Diagnosis present

## 2014-10-18 DIAGNOSIS — Z7982 Long term (current) use of aspirin: Secondary | ICD-10-CM | POA: Diagnosis not present

## 2014-10-18 DIAGNOSIS — R059 Cough, unspecified: Secondary | ICD-10-CM

## 2014-10-18 DIAGNOSIS — F17210 Nicotine dependence, cigarettes, uncomplicated: Secondary | ICD-10-CM | POA: Diagnosis present

## 2014-10-18 DIAGNOSIS — Z8249 Family history of ischemic heart disease and other diseases of the circulatory system: Secondary | ICD-10-CM

## 2014-10-18 DIAGNOSIS — E039 Hypothyroidism, unspecified: Secondary | ICD-10-CM | POA: Diagnosis present

## 2014-10-18 DIAGNOSIS — J9601 Acute respiratory failure with hypoxia: Secondary | ICD-10-CM

## 2014-10-18 DIAGNOSIS — J45909 Unspecified asthma, uncomplicated: Secondary | ICD-10-CM | POA: Diagnosis present

## 2014-10-18 DIAGNOSIS — F329 Major depressive disorder, single episode, unspecified: Secondary | ICD-10-CM | POA: Diagnosis present

## 2014-10-18 DIAGNOSIS — Z801 Family history of malignant neoplasm of trachea, bronchus and lung: Secondary | ICD-10-CM

## 2014-10-18 DIAGNOSIS — E7800 Pure hypercholesterolemia, unspecified: Secondary | ICD-10-CM | POA: Diagnosis present

## 2014-10-18 DIAGNOSIS — R0989 Other specified symptoms and signs involving the circulatory and respiratory systems: Secondary | ICD-10-CM

## 2014-10-18 DIAGNOSIS — E871 Hypo-osmolality and hyponatremia: Secondary | ICD-10-CM | POA: Diagnosis present

## 2014-10-18 DIAGNOSIS — J441 Chronic obstructive pulmonary disease with (acute) exacerbation: Principal | ICD-10-CM | POA: Diagnosis present

## 2014-10-18 DIAGNOSIS — Z7951 Long term (current) use of inhaled steroids: Secondary | ICD-10-CM | POA: Diagnosis not present

## 2014-10-18 DIAGNOSIS — R0602 Shortness of breath: Secondary | ICD-10-CM | POA: Diagnosis present

## 2014-10-18 DIAGNOSIS — R05 Cough: Secondary | ICD-10-CM

## 2014-10-18 DIAGNOSIS — J61 Pneumoconiosis due to asbestos and other mineral fibers: Secondary | ICD-10-CM | POA: Diagnosis present

## 2014-10-18 DIAGNOSIS — I10 Essential (primary) hypertension: Secondary | ICD-10-CM | POA: Diagnosis present

## 2014-10-18 DIAGNOSIS — Z791 Long term (current) use of non-steroidal anti-inflammatories (NSAID): Secondary | ICD-10-CM

## 2014-10-18 DIAGNOSIS — J449 Chronic obstructive pulmonary disease, unspecified: Secondary | ICD-10-CM | POA: Diagnosis present

## 2014-10-18 DIAGNOSIS — F1010 Alcohol abuse, uncomplicated: Secondary | ICD-10-CM | POA: Diagnosis present

## 2014-10-18 DIAGNOSIS — J440 Chronic obstructive pulmonary disease with acute lower respiratory infection: Secondary | ICD-10-CM | POA: Diagnosis present

## 2014-10-18 DIAGNOSIS — Z825 Family history of asthma and other chronic lower respiratory diseases: Secondary | ICD-10-CM | POA: Diagnosis not present

## 2014-10-18 DIAGNOSIS — Z720 Tobacco use: Secondary | ICD-10-CM | POA: Diagnosis not present

## 2014-10-18 DIAGNOSIS — F17200 Nicotine dependence, unspecified, uncomplicated: Secondary | ICD-10-CM

## 2014-10-18 DIAGNOSIS — F1910 Other psychoactive substance abuse, uncomplicated: Secondary | ICD-10-CM | POA: Diagnosis not present

## 2014-10-18 HISTORY — DX: Hypothyroidism, unspecified: E03.9

## 2014-10-18 HISTORY — DX: Unspecified osteoarthritis, unspecified site: M19.90

## 2014-10-18 LAB — POCT CBC
Granulocyte percent: 71.9 %G (ref 37–80)
HCT, POC: 47.7 % (ref 43.5–53.7)
Hemoglobin: 15.9 g/dL (ref 14.1–18.1)
Lymph, poc: 1.3 (ref 0.6–3.4)
MCH, POC: 33.3 pg — AB (ref 27–31.2)
MCHC: 33.3 g/dL (ref 31.8–35.4)
MCV: 99.9 fL — AB (ref 80–97)
MPV: 5.7 fL (ref 0–99.8)
PLATELET COUNT, POC: 219 10*3/uL (ref 142–424)
POC Granulocyte: 4.5 (ref 2–6.9)
POC LYMPH PERCENT: 21.5 %L (ref 10–50)
RBC: 4.77 M/uL (ref 4.69–6.13)
RDW, POC: 13.7 %
WBC: 6.2 10*3/uL (ref 4.6–10.2)

## 2014-10-18 LAB — COMPREHENSIVE METABOLIC PANEL
ALK PHOS: 64 U/L (ref 38–126)
ALT: 33 U/L (ref 17–63)
AST: 32 U/L (ref 15–41)
Albumin: 4.2 g/dL (ref 3.5–5.0)
Anion gap: 13 (ref 5–15)
BUN: 7 mg/dL (ref 6–20)
CALCIUM: 9.2 mg/dL (ref 8.9–10.3)
CO2: 26 mmol/L (ref 22–32)
CREATININE: 0.91 mg/dL (ref 0.61–1.24)
Chloride: 92 mmol/L — ABNORMAL LOW (ref 101–111)
GFR calc Af Amer: >60 mL/min (ref >60)
GFR calc non Af Amer: >60 mL/min (ref >60)
Glucose, Bld: 228 mg/dL — ABNORMAL HIGH (ref 65–99)
POTASSIUM: 3.7 mmol/L (ref 3.5–5.1)
SODIUM: 131 mmol/L — AB (ref 135–145)
Total Bilirubin: 0.7 mg/dL (ref 0.3–1.2)
Total Protein: 7.5 g/dL (ref 6.5–8.1)

## 2014-10-18 LAB — CBC WITH DIFFERENTIAL/PLATELET
BASOS ABS: 0 10*3/uL (ref 0.0–0.1)
BASOS PCT: 0 % (ref 0–1)
EOS PCT: 0 % (ref 0–5)
Eosinophils Absolute: 0 10*3/uL (ref 0.0–0.7)
HCT: 45 % (ref 39.0–52.0)
HEMOGLOBIN: 16.4 g/dL (ref 13.0–17.0)
Lymphocytes Relative: 8 % — ABNORMAL LOW (ref 12–46)
Lymphs Abs: 0.4 10*3/uL — ABNORMAL LOW (ref 0.7–4.0)
MCH: 34.9 pg — ABNORMAL HIGH (ref 26.0–34.0)
MCHC: 36.4 g/dL — ABNORMAL HIGH (ref 30.0–36.0)
MCV: 95.7 fL (ref 78.0–100.0)
Monocytes Absolute: 0.1 10*3/uL (ref 0.1–1.0)
Monocytes Relative: 2 % — ABNORMAL LOW (ref 3–12)
NEUTROS ABS: 5 10*3/uL (ref 1.7–7.7)
Neutrophils Relative %: 90 % — ABNORMAL HIGH (ref 43–77)
Platelets: 169 10*3/uL (ref 150–400)
RBC: 4.7 MIL/uL (ref 4.22–5.81)
RDW: 13.2 % (ref 11.5–15.5)
WBC: 5.5 10*3/uL (ref 4.0–10.5)

## 2014-10-18 MED ORDER — CYANOCOBALAMIN 250 MCG PO TABS
250.0000 ug | ORAL_TABLET | Freq: Every day | ORAL | Status: DC
  Administered 2014-10-19 – 2014-10-21 (×3): 250 ug via ORAL
  Filled 2014-10-18 (×3): qty 1

## 2014-10-18 MED ORDER — METHYLPREDNISOLONE SODIUM SUCC 125 MG IJ SOLR
80.0000 mg | Freq: Four times a day (QID) | INTRAMUSCULAR | Status: DC
  Administered 2014-10-18 – 2014-10-20 (×9): 80 mg via INTRAVENOUS
  Filled 2014-10-18: qty 1.28
  Filled 2014-10-18 (×2): qty 2
  Filled 2014-10-18 (×6): qty 1.28
  Filled 2014-10-18: qty 2
  Filled 2014-10-18: qty 1.28

## 2014-10-18 MED ORDER — FOLIC ACID 1 MG PO TABS: 1.0000 mg | ORAL_TABLET | Freq: Every day | ORAL | Status: DC

## 2014-10-18 MED ORDER — LEVOFLOXACIN IN D5W 500 MG/100ML IV SOLN
500.0000 mg | INTRAVENOUS | Status: DC
  Administered 2014-10-18 – 2014-10-20 (×3): 500 mg via INTRAVENOUS
  Filled 2014-10-18 (×3): qty 100

## 2014-10-18 MED ORDER — ALPRAZOLAM 0.5 MG PO TABS: 0.5000 mg | ORAL_TABLET | Freq: Three times a day (TID) | ORAL | Status: DC | PRN

## 2014-10-18 MED ORDER — IPRATROPIUM-ALBUTEROL 0.5-2.5 (3) MG/3ML IN SOLN
3.0000 mL | RESPIRATORY_TRACT | Status: DC
  Administered 2014-10-18 – 2014-10-19 (×2): 3 mL via RESPIRATORY_TRACT
  Filled 2014-10-18 (×3): qty 3

## 2014-10-18 MED ORDER — ENOXAPARIN SODIUM 30 MG/0.3ML ~~LOC~~ SOLN
30.0000 mg | SUBCUTANEOUS | Status: DC
  Administered 2014-10-18: 30 mg via SUBCUTANEOUS
  Filled 2014-10-18 (×2): qty 0.3

## 2014-10-18 MED ORDER — VITAMIN B-1 100 MG PO TABS
100.0000 mg | ORAL_TABLET | Freq: Every day | ORAL | Status: DC
  Administered 2014-10-18 – 2014-10-21 (×4): 100 mg via ORAL
  Filled 2014-10-18 (×4): qty 1

## 2014-10-18 MED ORDER — THIAMINE HCL 100 MG/ML IJ SOLN
100.0000 mg | Freq: Every day | INTRAMUSCULAR | Status: DC
  Filled 2014-10-18 (×2): qty 1

## 2014-10-18 MED ORDER — ALBUTEROL SULFATE (2.5 MG/3ML) 0.083% IN NEBU: 2.5000 mg | INHALATION_SOLUTION | RESPIRATORY_TRACT | Status: DC | PRN

## 2014-10-18 MED ORDER — ASPIRIN 81 MG PO CHEW
81.0000 mg | CHEWABLE_TABLET | Freq: Every day | ORAL | Status: DC
  Administered 2014-10-19 – 2014-10-21 (×3): 81 mg via ORAL
  Filled 2014-10-18 (×3): qty 1

## 2014-10-18 MED ORDER — MELOXICAM 15 MG PO TABS: 15.0000 mg | ORAL_TABLET | Freq: Every day | ORAL | Status: DC

## 2014-10-18 MED ORDER — ACETAMINOPHEN 650 MG RE SUPP: 650.0000 mg | Freq: Four times a day (QID) | RECTAL | Status: DC | PRN

## 2014-10-18 MED ORDER — DM-GUAIFENESIN ER 30-600 MG PO TB12
1.0000 | ORAL_TABLET | Freq: Two times a day (BID) | ORAL | Status: DC
  Administered 2014-10-18 – 2014-10-21 (×6): 1 via ORAL
  Filled 2014-10-18 (×7): qty 1

## 2014-10-18 MED ORDER — LORAZEPAM 2 MG/ML IJ SOLN: 1.0000 mg | Freq: Four times a day (QID) | INTRAMUSCULAR | Status: DC | PRN

## 2014-10-18 MED ORDER — ESCITALOPRAM OXALATE 20 MG PO TABS
20.0000 mg | ORAL_TABLET | Freq: Every day | ORAL | Status: DC
  Administered 2014-10-18 – 2014-10-21 (×4): 20 mg via ORAL
  Filled 2014-10-18 (×4): qty 1

## 2014-10-18 MED ORDER — ACETAMINOPHEN 325 MG PO TABS: 650.0000 mg | ORAL_TABLET | Freq: Four times a day (QID) | ORAL | Status: DC | PRN

## 2014-10-18 MED ORDER — ADULT MULTIVITAMIN W/MINERALS CH
1.0000 | ORAL_TABLET | Freq: Every day | ORAL | Status: DC
  Administered 2014-10-18 – 2014-10-21 (×4): 1 via ORAL
  Filled 2014-10-18 (×4): qty 1

## 2014-10-18 MED ORDER — LORAZEPAM 1 MG PO TABS: 1.0000 mg | ORAL_TABLET | Freq: Four times a day (QID) | ORAL | Status: DC | PRN

## 2014-10-18 MED ORDER — NIFEDIPINE ER OSMOTIC RELEASE 90 MG PO TB24
90.0000 mg | ORAL_TABLET | Freq: Every day | ORAL | Status: DC
  Administered 2014-10-19 – 2014-10-21 (×3): 90 mg via ORAL
  Filled 2014-10-18 (×3): qty 1

## 2014-10-18 MED ORDER — ONDANSETRON HCL 4 MG PO TABS: 4.0000 mg | ORAL_TABLET | Freq: Four times a day (QID) | ORAL | Status: DC | PRN

## 2014-10-18 MED ORDER — NICOTINE 14 MG/24HR TD PT24
14.0000 mg | MEDICATED_PATCH | Freq: Every day | TRANSDERMAL | Status: DC
  Administered 2014-10-18 – 2014-10-21 (×4): 14 mg via TRANSDERMAL
  Filled 2014-10-18 (×4): qty 1

## 2014-10-18 MED ORDER — SODIUM CHLORIDE 0.9 % IJ SOLN
3.0000 mL | Freq: Two times a day (BID) | INTRAMUSCULAR | Status: DC
  Administered 2014-10-18 – 2014-10-21 (×5): 3 mL via INTRAVENOUS

## 2014-10-18 MED ORDER — FOLIC ACID 1 MG PO TABS
1.0000 mg | ORAL_TABLET | Freq: Every day | ORAL | Status: DC
  Administered 2014-10-18 – 2014-10-21 (×4): 1 mg via ORAL
  Filled 2014-10-18 (×4): qty 1

## 2014-10-18 MED ORDER — VITAMIN B-12 250 MCG PO TABS: 250.0000 ug | ORAL_TABLET | Freq: Every day | ORAL | Status: DC

## 2014-10-18 MED ORDER — MOMETASONE FURO-FORMOTEROL FUM 100-5 MCG/ACT IN AERO
2.0000 | INHALATION_SPRAY | Freq: Two times a day (BID) | RESPIRATORY_TRACT | Status: DC
  Administered 2014-10-18 – 2014-10-21 (×6): 2 via RESPIRATORY_TRACT
  Filled 2014-10-18: qty 8.8

## 2014-10-18 MED ORDER — LEVOTHYROXINE SODIUM 25 MCG PO TABS
25.0000 ug | ORAL_TABLET | Freq: Every day | ORAL | Status: DC
  Administered 2014-10-19 – 2014-10-21 (×3): 25 ug via ORAL
  Filled 2014-10-18 (×4): qty 1

## 2014-10-18 MED ORDER — IPRATROPIUM-ALBUTEROL 0.5-2.5 (3) MG/3ML IN SOLN
3.0000 mL | Freq: Once | RESPIRATORY_TRACT | Status: DC
  Administered 2014-10-18: 3 mL via RESPIRATORY_TRACT

## 2014-10-18 MED ORDER — ONDANSETRON HCL 4 MG/2ML IJ SOLN: 4.0000 mg | Freq: Four times a day (QID) | INTRAMUSCULAR | Status: DC | PRN

## 2014-10-18 NOTE — Progress Notes (Addendum)
Patient Saturations on Room Air at Rest = 95% , HR 102  Patient Saturations on Room Air while Ambulating = 94%, HR 106    Patient states he drinks 6-12 beers a day with last beer being 10/17/14, and smokes 1.5 packs of cigarettes a day. Dr. Broadus John made aware.

## 2014-10-18 NOTE — ED Notes (Signed)
Per [facility] pt has been SOB since last Friday. Pt went to see PCP today and called EMS to bring him here.  Pt was given 2 of xopenex and Celestoe 6mg .  Pt was 89% on RA upon arrival to PCP. VS are as follows: BP: 118/62 HR:100 Resp:18 O2sat: 96% on RA.

## 2014-10-18 NOTE — Progress Notes (Signed)
Subjective:  Patient ID: Frank Rosales, male    DOB: 1955/08/06  Age: 59 y.o. MRN: [id number]  CC: URI   HPI Frank Rosales presents for  Cough. Onset 3 days ago. He had been working an aggressive schedule at work with about 12 were more days of work before a day off 3 days ago. He woke up that morning with the cough. The following day he was coughing so hard he passed out . Drank 5-6 beers before it occured. Still actively smoking approximately 37 pack year history. Cough has been productive. He has primarily had morning productivity with purulent matter noted. He denies any chest pain. He has been short of breath. He has been resting and unable to perform regular activities due to the shortness of breath over the last couple of days. He does not use a home nebulizer but he does use pro-air when necessary which she has used a few times over the last few days.  History Frank Rosales has a past medical history of High blood pressure; Asthma; High cholesterol; Hilar adenopathy; COPD (chronic obstructive pulmonary disease); Thyroid disease; Anxiety; and Colon polyps.   He has past surgical history that includes Knee surgery (05/14/2010); Knee arthroscopy (05/14/2010); Wisdom tooth extraction; and epidural injections.   His family history includes Emphysema in his father; Heart disease in his brother; Heart failure in his mother; Lung cancer in his father.He reports that he has been smoking Cigarettes.  He started smoking about 41 years ago. He has been smoking about 1.50 packs per day. He has never used smokeless tobacco. He reports that he drinks about 24.0 oz of alcohol per week. He reports that he does not use illicit drugs.  Outpatient Prescriptions Prior to Visit  Medication Sig Dispense Refill  . albuterol (PROAIR HFA) 108 (90 BASE) MCG/ACT inhaler Inhale 2 puffs into the lungs every 6 (six) hours as needed for wheezing or shortness of breath. 8 g 11  . ALPRAZolam (XANAX) 0.5 MG tablet Take 1 tablet  (0.5 mg total) by mouth 3 (three) times daily as needed. 90 tablet 3  . aspirin 81 MG tablet Take 81 mg by mouth daily. Hold's medication prior to spinal injections    . cholecalciferol (VITAMIN D) 1000 UNITS tablet Take 2,000 Units by mouth daily.     . cyclobenzaprine (FLEXERIL) 5 MG tablet Take 1 tablet (5 mg total) by mouth 3 (three) times daily as needed for muscle spasms. (Patient not taking: Reported on 07/12/2014) 30 tablet 1  . escitalopram (LEXAPRO) 20 MG tablet Take 1 tablet (20 mg total) by mouth daily. 90 tablet 0  . Fluticasone-Salmeterol (ADVAIR DISKUS) 250-50 MCG/DOSE AEPB INHALE 1 PUFF 2 TIMES DAILY 638 each 11  . folic acid (FOLVITE) 1 MG tablet Take 1 tablet (1 mg total) by mouth daily. 90 tablet 3  . levothyroxine (SYNTHROID, LEVOTHROID) 25 MCG tablet TAKE 1 TABLET (25 MCG TOTAL) BY MOUTH DAILY. 90 tablet 2  . meloxicam (MOBIC) 15 MG tablet Take 1 tablet (15 mg total) by mouth daily. (Patient not taking: Reported on 07/12/2014) 30 tablet 5  . NIFEdipine (PROCARDIA XL/ADALAT-CC) 90 MG 24 hr tablet TAKE 1 TABLET (90 MG TOTAL) BY MOUTH DAILY. 90 tablet 0  . OMEGA-3 KRILL OIL PO Take 1 capsule by mouth daily.    . valsartan-hydrochlorothiazide (DIOVAN-HCT) 320-25 MG per tablet TAKE 1 TABLET BY MOUTH DAILY. 90 tablet 0  . vitamin B-12 (CYANOCOBALAMIN) 250 MCG tablet Take 250 mcg by mouth daily.    Marland Kitchen  levofloxacin (LEVAQUIN) 500 MG tablet Take 1 tablet (500 mg total) by mouth daily. 5 tablet 0  . predniSONE (DELTASONE) 20 MG tablet 3 tabs daily for 2 days, 2 tabs daily for 2 days, 1 tab daily for 2 days 12 tablet 0   No facility-administered medications prior to visit.    ROS Review of Systems  Constitutional: Positive for activity change (decreased due to illness) and fatigue. Negative for fever, chills, diaphoresis, appetite change and unexpected weight change.  HENT: Positive for congestion. Negative for drooling, ear pain, hearing loss, nosebleeds, postnasal drip, rhinorrhea,  sinus pressure and sore throat.   Eyes: Negative.   Respiratory: Positive for chest tightness, shortness of breath and wheezing. Negative for choking and stridor.   Cardiovascular: Positive for palpitations. Negative for chest pain.  Gastrointestinal: Negative for nausea, vomiting, abdominal pain, diarrhea, constipation and abdominal distention.  Endocrine: Negative.   Genitourinary: Negative.   Musculoskeletal: Negative for myalgias and joint swelling.  Skin: Negative.   Neurological: Positive for syncope and light-headedness. Negative for tremors and headaches.  Hematological: Negative for adenopathy. Does not bruise/bleed easily.  Psychiatric/Behavioral: Negative for confusion, decreased concentration and agitation. The patient is nervous/anxious.     Objective:  BP 129/85 mmHg  Pulse 101  Temp(Src) 98.3 F (36.8 C) (Oral)  Ht 5\' 9"  (1.753 m)  Wt 170 lb (77.111 kg)  BMI 25.09 kg/m2  SpO2 89%  BP Readings from Last 3 Encounters:  10/18/14 129/85  07/12/14 126/84  07/01/14 143/95    Wt Readings from Last 3 Encounters:  10/18/14 170 lb (77.111 kg)  07/12/14 171 lb (77.565 kg)  07/01/14 176 lb (79.833 kg)     Physical Exam  Constitutional: He appears well-developed and well-nourished. He appears distressed.  HENT:  Head: Normocephalic and atraumatic.  Eyes: EOM are normal. Pupils are equal, round, and reactive to light.  Neck: Normal range of motion. Neck supple. No JVD present. No tracheal deviation present. No thyromegaly present.  Cardiovascular: Regular rhythm and intact distal pulses.  Exam reveals no gallop and no friction rub.   No murmur heard. Tachycardia at 1 20/m and regular. Sinus tach with nonspecific ST changes inferiorly and anteriorly on EKG.  Pulmonary/Chest: No stridor. He is in respiratory distress. He has wheezes. He exhibits no tenderness.  Lungs tight with very minimal exchange on auscultation. Pulse ox 89 initially with heart rate 115. Neb with  Xopenex given. Celestone IM injection 6 mg given. Pulse ox subsequently 88 with heart rate 105. Oxygen started at 2 L and pulse ox climbed to 92. A second Xopenex neb was given with pulse ox climbing to 95 on oxygen and heart rate remaining at 105.  Skin: Skin is warm. No rash noted. He is diaphoretic. No pallor.  Face flushed  Psychiatric: He has a normal mood and affect.    No results found for: HGBA1C  Lab Results  Component Value Date   WBC 6.2 10/18/2014   HGB 15.9 10/18/2014   HCT 47.7 10/18/2014   PLT 191 10/20/2010   GLUCOSE 112* 07/12/2014   CHOL 220* 12/03/2013   TRIG 99 12/03/2013   HDL 75 12/03/2013   LDLCALC 125* 12/03/2013   ALT 29 12/03/2013   AST 28 12/03/2013   NA 134 07/12/2014   K 4.1 07/12/2014   CL 94* 07/12/2014   CREATININE 0.82 07/12/2014   BUN 10 07/12/2014   CO2 23 07/12/2014   TSH 2.230 12/03/2013   PSA 0.9 02/06/2013   INR 0.98 10/20/2010  US Carotid Duplex Bilateral  12/18/2013   CLINICAL DATA:  Carotid artery calcifications.  EXAM: BILATERAL CAROTID DUPLEX ULTRASOUND  TECHNIQUE: Pearline Cables scale imaging, color Doppler and duplex ultrasound were performed of bilateral carotid and vertebral arteries in the neck.  COMPARISON:  10/10/2012  FINDINGS: Criteria: Quantification of carotid stenosis is based on velocity parameters that correlate the residual internal carotid diameter with NASCET-based stenosis levels, using the diameter of the distal internal carotid lumen as the denominator for stenosis measurement.  The following velocity measurements were obtained:  RIGHT  ICA:  62 cm/sec  CCA:  83 cm/sec  SYSTOLIC ICA/CCA RATIO:  0.8  DIASTOLIC ICA/CCA RATIO:  1.3  ECA:  76 cm/sec  LEFT  ICA:  54 cm/sec  CCA:  65 cm/sec  SYSTOLIC ICA/CCA RATIO:  0.8  DIASTOLIC ICA/CCA RATIO:  1.3  ECA:  55 cm/sec  RIGHT CAROTID ARTERY: Mild plaque or intimal thickening at the right carotid bulb. Minimal plaque in the proximal external carotid artery. No significant stenosis in the  right internal carotid artery.  RIGHT VERTEBRAL ARTERY: Antegrade flow and normal waveform in the right vertebral artery.  LEFT CAROTID ARTERY: There is echogenic plaque at the proximal left internal carotid artery. The plaque in the internal carotid artery is similar to the previous examination. Normal waveforms and velocities in the left internal carotid artery.  LEFT VERTEBRAL ARTERY: Antegrade flow and normal waveform in the left vertebral artery.  IMPRESSION: Mild atherosclerotic disease in the carotid arteries. Estimated degree of narrowing in the internal carotid arteries is less than 50% bilaterally.  Patent vertebral arteries.   Electronically Signed   By: Markus Daft M.D.   On: 12/18/2013 10:17    Assessment & Plan:   Frank Rosales was seen today for uri.  Diagnoses and all orders for this visit:  COPD exacerbation  Chest congestion Orders: -     DG Chest 2 View; Future -     POCT CBC -     DG Chest 2 View  SOB (shortness of breath) Orders: -     EKG 12-Lead  Acute respiratory failure with hypoxia  Tobacco use disorder     Results for orders placed or performed in visit on 10/18/14  POCT CBC  Result Value Ref Range   WBC 6.2 4.6 - 10.2 K/uL   Lymph, poc 1.3 0.6 - 3.4   POC LYMPH PERCENT 21.5 10 - 50 %L   POC Granulocyte 4.5 2 - 6.9   Granulocyte percent 71.9 37 - 80 %G   RBC 4.77 4.69 - 6.13 M/uL   Hemoglobin 15.9 14.1 - 18.1 g/dL   HCT, POC 47.7 43.5 - 53.7 %   MCV 99.9 (A) 80 - 97 fL   MCH, POC 33.3 (A) 27 - 31.2 pg   MCHC 33.3 31.8 - 35.4 g/dL   RDW, POC 13.7 %   Platelet Count, POC 219 142 - 424 K/uL   MPV 5.7 0 - 99.8 fL    CXR - possible Lingular infiltrate Case discussed with Dr. Broadus John of inpatient medical service at [facility]. Patient was accepted for admission to monitored bed. Subsequently EMS was summoned and patient transferred by EMS to [facility] with oxygen and monitor. Follow-up: 2-3 days after hospital discharge.  Claretta Fraise, M.D.

## 2014-10-18 NOTE — Progress Notes (Signed)
Frank Rosales is a 59 y.o. male patient admitted from ED awake, alert - oriented  X 3 - no acute distress noted.  VSS - Blood pressure 126/89, pulse 96, temperature 98.7 F (37.1 C), temperature source Oral, resp. rate 16, height 5\' 10"  (1.778 m), weight 74.98 kg (165 lb 4.8 oz), SpO2 96 %.    IV in place, occlusive dsg intact without redness.  Patient has bilateral expiratory wheezing in lungs. No complaints of SOB at this time. Coughing that is productive with thick green tinged mucous per patient. Patient states coughing causes pain in abdomen. Dr. Broadus John made aware.   Orientation to room, and floor completed with information packet given to patient/family.  Patient declined safety video at this time.  Admission INP armband ID verified with patient/family, and in place.   SR up x 2, fall assessment complete, with patient and family able to verbalize understanding of risk associated with falls, and verbalized understanding to call nsg before up out of bed.  Call light within reach, patient able to voice, and demonstrate understanding.  Skin, clean-dry- intact without evidence of bruising, or skin tears.   No evidence of skin break down noted on exam.    Will cont to eval and treat per MD orders.  Janalyn Shy, RN 10/18/2014 4:23 PM

## 2014-10-18 NOTE — ED Provider Notes (Signed)
CSN: [id number]     Arrival date & time 10/18/14  1410 History   First MD Initiated Contact with Patient 10/18/14 1411     Chief Complaint  Patient presents with  . Shortness of Breath   HPI Frank Rosales is a 59yo man with PMHx of HTN, COPD, and hyperlipidemia who presents to the ED with dyspnea. Patient reports having a sore throat and productive cough starting 3 days ago. He then noticed progressive dyspnea. He reports he saw his PCP this morning and was told to go to the ED. He received 2 xopenex and celestone 6 mg at his PCP's office. He reports increased mucus production and pleuritic chest pain. He denies fevers, chills, abdominal pain, nausea, vomiting.    Past Medical History  Diagnosis Date  . High blood pressure   . Asthma   . High cholesterol   . Hilar adenopathy   . COPD (chronic obstructive pulmonary disease)   . Thyroid disease   . Anxiety   . Colon polyps    Past Surgical History  Procedure Laterality Date  . Knee surgery  05/14/2010  . Knee arthroscopy  05/14/2010  . Wisdom tooth extraction    . Epidural injections     Family History  Problem Relation Age of Onset  . Emphysema Father   . Heart disease Brother   . Heart failure Mother   . Lung cancer Father    History  Substance Use Topics  . Smoking status: Current Every Day Smoker -- 1.50 packs/day    Types: Cigarettes    Start date: 05/14/1973  . Smokeless tobacco: Never Used     Comment: Started smoking at age 8.    Marland Kitchen Alcohol Use: 24.0 oz/week    40 Cans of beer per week     Comment: 6 beers per day    Review of Systems General: Denies night sweats, changes in weight, changes in appetite HEENT: Denies headaches, ear pain, changes in vision, rhinorrhea, sore throat CV: Denies palpitations, orthopnea Pulm: See HPI GI: Denies diarrhea, constipation, melena, hematochezia GU: Denies dysuria, hematuria, frequency Msk: Denies muscle cramps, joint pains Neuro: Denies weakness, numbness, tingling Skin:  Denies rashes, bruising   Allergies  Review of patient's allergies indicates no known allergies.  Home Medications   Prior to Admission medications   Medication Sig Start Date End Date Taking? Authorizing Provider  albuterol (PROAIR HFA) 108 (90 BASE) MCG/ACT inhaler Inhale 2 puffs into the lungs every 6 (six) hours as needed for wheezing or shortness of breath. 04/20/14   Lysbeth Penner, FNP  ALPRAZolam Duanne Moron) 0.5 MG tablet Take 1 tablet (0.5 mg total) by mouth 3 (three) times daily as needed. 04/20/14   Lysbeth Penner, FNP  aspirin 81 MG tablet Take 81 mg by mouth daily. Hold's medication prior to spinal injections    Historical Provider, MD  cholecalciferol (VITAMIN D) 1000 UNITS tablet Take 2,000 Units by mouth daily.     Historical Provider, MD  cyclobenzaprine (FLEXERIL) 5 MG tablet Take 1 tablet (5 mg total) by mouth 3 (three) times daily as needed for muscle spasms. Patient not taking: Reported on 07/12/2014 11/19/13   Mary-Margaret Hassell Done, FNP  escitalopram (LEXAPRO) 20 MG tablet Take 1 tablet (20 mg total) by mouth daily. 09/21/14   Mary-Margaret Hassell Done, FNP  Fluticasone-Salmeterol (ADVAIR DISKUS) 250-50 MCG/DOSE AEPB INHALE 1 PUFF 2 TIMES DAILY 04/20/14   Lysbeth Penner, FNP  folic acid (FOLVITE) 1 MG tablet Take 1 tablet (1 mg total)  by mouth daily. 02/06/13   Lysbeth Penner, FNP  levothyroxine (SYNTHROID, LEVOTHROID) 25 MCG tablet TAKE 1 TABLET (25 MCG TOTAL) BY MOUTH DAILY. 03/23/14   Mary-Margaret Hassell Done, FNP  meloxicam (MOBIC) 15 MG tablet Take 1 tablet (15 mg total) by mouth daily. Patient not taking: Reported on 07/12/2014 11/19/13   Mary-Margaret Hassell Done, FNP  NIFEdipine (PROCARDIA XL/ADALAT-CC) 90 MG 24 hr tablet TAKE 1 TABLET (90 MG TOTAL) BY MOUTH DAILY. 07/22/14   Mary-Margaret Hassell Done, FNP  OMEGA-3 KRILL OIL PO Take 1 capsule by mouth daily.    Historical Provider, MD  valsartan-hydrochlorothiazide (DIOVAN-HCT) 320-25 MG per tablet TAKE 1 TABLET BY MOUTH DAILY. 09/27/14    Wardell Honour, MD  vitamin B-12 (CYANOCOBALAMIN) 250 MCG tablet Take 250 mcg by mouth daily.    Historical Provider, MD   Pulse 81  Temp(Src) 98.8 F (37.1 C) (Oral)  Resp 14  Ht 5\' 10"  (1.778 m)  Wt 170 lb (77.111 kg)  BMI 24.39 kg/m2  SpO2 95% Physical Exam General: middle aged man sitting up in bed, NAD HEENT: [place]/AT, EOMI, sclera anicteric, mucus membranes moist CV: RRR, no m/g/r Pulm: bilateral expiratory wheezes, breathing comfortably on 2 L oxygen via [place] Abd: BS+, soft, non-tender, non-distended Ext: warm, no edema Neuro: alert and oriented x 3, no focal deficits  ED Course  Procedures (including critical care time) Labs Review Labs Reviewed - No data to display  Imaging Review Dg Chest 2 View  10/18/2014   CLINICAL DATA:  Shortness of breath.  Cough and congestion .  EXAM: CHEST  2 VIEW  COMPARISON:  07/12/2014, 07/01/2014, 08/20/2013.  FINDINGS: Mediastinum and hilar structures are normal. Mild left base infiltrate cannot be excluded. No pneumothorax. Mild bilateral pleural thickening noted consistent scarring. Similar findings noted on prior exams. Heart size normal. Degenerative changes thoracic spine.  IMPRESSION: Mild left base infiltrate cannot be excluded.   Electronically Signed   By: Marcello Moores  Register   On: 10/18/2014 12:02     EKG Interpretation None      MDM   Final diagnoses:  COPD exacerbation    59yo man presenting with dyspnea due to COPD exacerbation likely secondary to viral infection. Will give another duoneb treatment as he still has some expiratory wheezing on exam. He will be admitted by the [facility] Medicine teaching service.     Juliet Rude, MD 10/18/14 1506  Dorie Rank, MD 10/18/14 [phone]

## 2014-10-18 NOTE — H&P (Addendum)
[facility] History and Physical  Frank Rosales HAL:[id number] DOB: 27-May-1955 DOA: 10/18/2014  Referring physician: PCP;  PCP: Claretta Fraise, MD   Chief Complaint: direct admission shortness of breath and wheezing  HPI: Frank Rosales is a 59 y.o. male with past medical history of COPD, hypertension, anxiety, ongoing heavy alcohol and tobacco abuse presented to his primary care doctor's office today with the above complaint. And he was referred to [facility] as a direct admission. Patient reports progressive dyspnea on exertion for the last 2-3 associated with productive cough and wheezing. He denies any fevers, reports some chills. Denies any chest pain, no PND or orthopnea. According to his daughter he passed out 2 days ago after a coughing fit during which he was really short of breath. Apparently he has passed out several times before in the last 4-6 months in the setting of ongoing respiratory problems.    Review of Systems: Positives bolded Constitutional:  No weight loss, night sweats, Fevers, chills, fatigue.  HEENT:  No headaches, Difficulty swallowing,Tooth/dental problems,Sore throat,  No sneezing, itching, ear ache, nasal congestion, post nasal drip,  Cardio-vascular:  No chest pain, Orthopnea, PND, swelling in lower extremities, anasarca, dizziness, palpitations  GI:  No heartburn, indigestion, abdominal pain, nausea, vomiting, diarrhea, change in bowel habits, loss of appetite  Resp:   shortness of breath with exertion or at rest.  excess mucus,  productive cough,No No non-productive cough, No coughing up of blood.No change in color of mucus.No wheezing.No chest wall deformity  Skin:  no rash or lesions.  GU:  no dysuria, change in color of urine, no urgency or frequency. No flank pain.  Musculoskeletal:  No joint pain or swelling. No decreased range of motion. No back pain.  Psych:  No change in mood or affect. No depression or anxiety. No memory  loss.   Past Medical History  Diagnosis Date  . High blood pressure   . Asthma   . High cholesterol   . Hilar adenopathy   . COPD (chronic obstructive pulmonary disease)   . Thyroid disease   . Anxiety   . Colon polyps    Past Surgical History  Procedure Laterality Date  . Knee surgery  05/14/2010  . Knee arthroscopy  05/14/2010  . Wisdom tooth extraction    . Epidural injections     Social History:  reports that he has been smoking Cigarettes.  He started smoking about 41 years ago. He has been smoking about 1.50 packs per day. He has never used smokeless tobacco. He reports that he drinks about 24.0 oz of alcohol per week. He reports that he does not use illicit drugs.  No Known Allergies  Family History  Problem Relation Age of Onset  . Emphysema Father   . Heart disease Brother   . Heart failure Mother   . Lung cancer Father      Prior to Admission medications   Medication Sig Start Date End Date Taking? Authorizing Provider  albuterol (PROAIR HFA) 108 (90 BASE) MCG/ACT inhaler Inhale 2 puffs into the lungs every 6 (six) hours as needed for wheezing or shortness of breath. 04/20/14  Yes Lysbeth Penner, FNP  ALPRAZolam Duanne Moron) 0.5 MG tablet Take 1 tablet (0.5 mg total) by mouth 3 (three) times daily as needed. Patient taking differently: Take 0.5 mg by mouth 3 (three) times daily as needed for anxiety.  04/20/14  Yes Lysbeth Penner, FNP  aspirin 81 MG tablet Take 81 mg by mouth  daily. Hold's medication prior to spinal injections   Yes Historical Provider, MD  cholecalciferol (VITAMIN D) 1000 UNITS tablet Take 1,000 Units by mouth daily.    Yes Historical Provider, MD  escitalopram (LEXAPRO) 20 MG tablet Take 1 tablet (20 mg total) by mouth daily. 09/21/14  Yes Mary-Margaret Hassell Done, FNP  Fluticasone-Salmeterol (ADVAIR DISKUS) 250-50 MCG/DOSE AEPB INHALE 1 PUFF 2 TIMES DAILY 04/20/14  Yes Lysbeth Penner, FNP  folic acid (FOLVITE) 1 MG tablet Take 1 tablet (1 mg total) by mouth  daily. 02/06/13  Yes Lysbeth Penner, FNP  levothyroxine (SYNTHROID, LEVOTHROID) 25 MCG tablet TAKE 1 TABLET (l5 MCG TOTAL) BY MOUTH DAILY. 03/23/14  Yes Mary-Margaret Hassell Done, FNP  meloxicam (MOBIC) 15 MG tablet Take 1 tablet (15 mg total) by mouth daily. 11/19/13  Yes Mary-Margaret Hassell Done, FNP  NIFEdipine (PROCARDIA XL/ADALAT-CC) 90 MG 24 hr tablet TAKE 1 TABLET (90 MG TOTAL) BY MOUTH DAILY. 07/22/14  Yes Mary-Margaret Hassell Done, FNP  OMEGA-3 KRILL OIL PO Take 1 capsule by mouth daily.   Yes Historical Provider, MD  valsartan-hydrochlorothiazide (DIOVAN-HCT) 320-25 MG per tablet TAKE 1 TABLET BY MOUTH DAILY. 09/27/14  Yes Wardell Honour, MD  vitamin B-12 (CYANOCOBALAMIN) 250 MCG tablet Take 250 mcg by mouth daily.   Yes Historical Provider, MD  cyclobenzaprine (FLEXERIL) 5 MG tablet Take 1 tablet (5 mg total) by mouth 3 (three) times daily as needed for muscle spasms. Patient not taking: Reported on 07/12/2014 11/19/13   Mary-Margaret Hassell Done, FNP   Physical Exam: Filed Vitals:   10/18/14 1613 10/18/14 1619  BP: 126/89   Pulse: 96   Temp: 98.7 F (37.1 C)   TempSrc: Oral   Resp: 16   Height: 5\' 10"  (1.778 m)   Weight: 74.98 kg (165 lb 4.8 oz)   SpO2: 96% 94%    Wt Readings from Last 3 Encounters:  10/18/14 74.98 kg (165 lb 4.8 oz)  10/18/14 77.111 kg (170 lb)  10/18/14 77.111 kg (170 lb)    General:  Appears calm and comfortable,  Eyes: PERRL, normal lids, irises & conjunctiva ENT: grossly normal hearing, lips & tongue Neck: no LAD, masses or thyromegaly Cardiovascular: RRR, no m/r/g. No LE edema. Telemetry: SR, no arrhythmias  Respiratory: very poor air movement, rare wheezes Abdomen: soft, ntnd, BS present Skin: no rash or induration seen on limited exam Musculoskeletal: grossly normal tone BUE/BLE Psychiatric: grossly normal mood and affect, speech fluent and appropriate Neurologic: grossly non-focal  COPD exacerbation         Labs on Admission: Basic Metabolic Panel: No  results for input(s): NA, K, CL, CO2, GLUCOSE, BUN, CREATININE, CALCIUM, MG, PHOS in the last 168 hours. Liver Function Tests: No results for input(s): AST, ALT, ALKPHOS, BILITOT, PROT, ALBUMIN in the last 168 hours. No results for input(s): LIPASE, AMYLASE in the last 168 hours. No results for input(s): AMMONIA in the last 168 hours. CBC:  Recent Labs Lab 10/18/14 1117  WBC 6.2  HGB 15.9  HCT 47.7  MCV 99.9*   Cardiac Enzymes: No results for input(s): CKTOTAL, CKMB, CKMBINDEX, TROPONINI in the last 168 hours.  BNP (last 3 results) No results for input(s): BNP in the last 8760 hours.  ProBNP (last 3 results) No results for input(s): PROBNP in the last 8760 hours.  CBG: No results for input(s): GLUCAP in the last 168 hours.  Radiological Exams on Admission: Dg Chest 2 View  10/18/2014   CLINICAL DATA:  Shortness of breath.  Cough and congestion .  EXAM: CHEST  2 VIEW  COMPARISON:  07/12/2014, 07/01/2014, 08/20/2013.  FINDINGS: Mediastinum and hilar structures are normal. Mild left base infiltrate cannot be excluded. No pneumothorax. Mild bilateral pleural thickening noted consistent scarring. Similar findings noted on prior exams. Heart size normal. Degenerative changes thoracic spine.  IMPRESSION: Mild left base infiltrate cannot be excluded.   Electronically Signed   By: Marcello Moores  Register   On: 10/18/2014 12:02    EKG: Independently reviewed. pending  Assessment/Plan 1. COPD exacerbation -start IV solumedrol, duonebs, levaquin  -mucinex -possible LLL pneumonia vs atelectasis, repeat CXR in am  2. Syncope -? Cough syncope, will keep on tele, check EKG -check 2d ECHO  3. ETOH abuse -high risk of withdrawal -counseled,  -CIWA, Thiamine  4. Tobacco abuse -60-80pk year smoker, counseled, nicotine patch  5. HTN -continue nifedipine, hold ARB, all labs pending  6. Anxiety/Depression -continue home meds  DIRECT ADMISSION, all labs pending at this time  Code Status:  Full Code DVT Prophylaxis: lovenox Family Communication:wife and daughter Disposition Plan:inaptient  Time spent: 5min  Elianny Buxbaum [facility] Pager [phone]

## 2014-10-18 NOTE — Progress Notes (Addendum)
Patient admitted to [phone]. Dr. Broadus John made aware.   [place] [facility] contacted to clarify attending for patient.

## 2014-10-19 ENCOUNTER — Inpatient Hospital Stay (HOSPITAL_COMMUNITY): Payer: 59

## 2014-10-19 ENCOUNTER — Other Ambulatory Visit (HOSPITAL_COMMUNITY): Payer: 59

## 2014-10-19 DIAGNOSIS — R55 Syncope and collapse: Secondary | ICD-10-CM | POA: Diagnosis present

## 2014-10-19 DIAGNOSIS — J189 Pneumonia, unspecified organism: Secondary | ICD-10-CM

## 2014-10-19 DIAGNOSIS — F1010 Alcohol abuse, uncomplicated: Secondary | ICD-10-CM | POA: Diagnosis present

## 2014-10-19 MED ORDER — IRBESARTAN 150 MG PO TABS
150.0000 mg | ORAL_TABLET | Freq: Every day | ORAL | Status: DC
  Administered 2014-10-19 – 2014-10-21 (×3): 150 mg via ORAL
  Filled 2014-10-19 (×3): qty 1

## 2014-10-19 MED ORDER — IPRATROPIUM-ALBUTEROL 0.5-2.5 (3) MG/3ML IN SOLN
3.0000 mL | Freq: Three times a day (TID) | RESPIRATORY_TRACT | Status: DC
  Administered 2014-10-19 – 2014-10-21 (×7): 3 mL via RESPIRATORY_TRACT
  Filled 2014-10-19 (×7): qty 3

## 2014-10-19 MED ORDER — SODIUM CHLORIDE 0.9 % IV SOLN
INTRAVENOUS | Status: AC
  Administered 2014-10-19 – 2014-10-20 (×2): via INTRAVENOUS

## 2014-10-19 MED ORDER — ENOXAPARIN SODIUM 40 MG/0.4ML ~~LOC~~ SOLN
40.0000 mg | SUBCUTANEOUS | Status: DC
  Administered 2014-10-19 – 2014-10-20 (×2): 40 mg via SUBCUTANEOUS
  Filled 2014-10-19 (×3): qty 0.4

## 2014-10-19 NOTE — Progress Notes (Signed)
[facility] PROGRESS NOTE  TORRES HARDENBROOK QBH:[id number] DOB: 1955/12/16 DOA: 10/18/2014  PCP: Claretta Fraise, MD  Brief HPI: 59 year old Caucasian male with a past medical history of COPD, tobacco abuse, alcohol, hypertension, presented with complaints of shortness of breath and wheezing to his primary care physician's office. He was referred to the hospital for inpatient management.  Past medical history:  Past Medical History  Diagnosis Date  . High blood pressure   . Asthma   . High cholesterol   . Hilar adenopathy   . COPD (chronic obstructive pulmonary disease)   . Anxiety   . Colon polyps   . Hypothyroidism   . Arthritis     "neck; lower back; right hip; right knee" (10/18/2014)    Consultants: None  Procedures: None  Antibiotics: Levaquin 6/6  Subjective: Patient feels slightly better this morning. Not wheezing as much. Continues to have a cough. Denies any chest pain. No nausea or vomiting. No dizziness or lightheadedness.  Objective: Vital Signs Blood pressure, heart rate stable. Filed Vitals:   10/18/14 1619 10/18/14 2014 10/19/14 0106 10/19/14 0815  BP:      Pulse:      Temp:      TempSrc:      Resp:      Height:      Weight:      SpO2: 94% 93% 93% 94%    Intake/Output Summary (Last 24 hours) at 10/19/14 1359 Last data filed at 10/19/14 0973  Gross per 24 hour  Intake    358 ml  Output    875 ml  Net   -517 ml   Filed Weights   10/18/14 1613  Weight: 74.98 kg (165 lb 4.8 oz)    General appearance: alert, cooperative, appears stated age and no distress Resp: Diminished entry at the bases. Few wheezes. No definite crackles. Cardio: regular rate and rhythm, S1, S2 normal, no murmur, click, rub or gallop GI: soft, non-tender; bowel sounds normal; no masses,  no organomegaly Extremities: extremities normal, atraumatic, no cyanosis or edema Neurologic: No focal deficits  Lab Results:  Basic Metabolic Panel:  Recent Labs Lab  10/18/14 1818  NA 131*  K 3.7  CL 92*  CO2 26  GLUCOSE 228*  BUN 7  CREATININE 0.91  CALCIUM 9.2   Liver Function Tests:  Recent Labs Lab 10/18/14 1818  AST 32  ALT 33  ALKPHOS 64  BILITOT 0.7  PROT 7.5  ALBUMIN 4.2   CBC:  Recent Labs Lab 10/18/14 1117 10/18/14 1818  WBC 6.2 5.5  NEUTROABS  --  5.0  HGB 15.9 16.4  HCT 47.7 45.0  MCV 99.9* 95.7  PLT  --  169    Studies/Results: Dg Chest 2 View  10/19/2014   CLINICAL DATA:  Cough and COPD  EXAM: CHEST  2 VIEW  COMPARISON:  10/18/2014  FINDINGS: Heart size is normal. No pleural effusion or edema. Lungs are hyperinflated. There is an opacity within the posterior left base which is increased from previous exam. There is also an anterior left base opacity. This is new from the previous exam compression deformity within the lower thoracic spine is unchanged.  IMPRESSION: 1. Progressive opacities in the left base compatible with pneumonia.   Electronically Signed   By: Kerby Moors M.D.   On: 10/19/2014 07:49   Dg Chest 2 View  10/18/2014   CLINICAL DATA:  Shortness of breath.  Cough and congestion .  EXAM: CHEST  2 VIEW  COMPARISON:  07/12/2014, 07/01/2014, 08/20/2013.  FINDINGS: Mediastinum and hilar structures are normal. Mild left base infiltrate cannot be excluded. No pneumothorax. Mild bilateral pleural thickening noted consistent scarring. Similar findings noted on prior exams. Heart size normal. Degenerative changes thoracic spine.  IMPRESSION: Mild left base infiltrate cannot be excluded.   Electronically Signed   By: Marcello Moores  Register   On: 10/18/2014 12:02    Medications:  Scheduled: . aspirin  81 mg Oral Daily  . dextromethorphan-guaiFENesin  1 tablet Oral BID  . enoxaparin (LOVENOX) injection  40 mg Subcutaneous Q24H  . escitalopram  20 mg Oral Daily  . folic acid  1 mg Oral Daily  . ipratropium-albuterol  3 mL Nebulization TID  . irbesartan  150 mg Oral Daily  . levofloxacin (LEVAQUIN) IV  500 mg  Intravenous Q24H  . levothyroxine  25 mcg Oral QAC breakfast  . methylPREDNISolone (SOLU-MEDROL) injection  80 mg Intravenous Q6H  . mometasone-formoterol  2 puff Inhalation BID  . multivitamin with minerals  1 tablet Oral Daily  . nicotine  14 mg Transdermal Daily  . NIFEdipine  90 mg Oral Daily  . sodium chloride  3 mL Intravenous Q12H  . thiamine  100 mg Oral Daily  . vitamin B-12  250 mcg Oral Daily   Continuous: . sodium chloride 75 mL/hr at 10/19/14 1046   UVJ:[id number] **OR** acetaminophen, albuterol, ALPRAZolam, LORazepam **OR** LORazepam, ondansetron **OR** ondansetron (ZOFRAN) IV  Assessment/Plan:  Active Problems:   Chronic bronchitis with gold stage C. COPD and ongoing tobacco use   High cholesterol   Nicotine abuse   GAD (generalized anxiety disorder)   COPD exacerbation    Acute COPD exacerbation Improving slowly. Continue treatment with steroids, antibiotics, nebulizer treatments. Oxygen as needed.  Left lower lobe pneumonia/community-acquired Continue antibiotics as mentioned above. WBC was normal.  Syncope Likely secondary to coughing spells. Echocardiogram is pending. Monitor on telemetry. EKG shows sinus rhythm with nonspecific T-wave changes. Nothing acutely ischemic. Patient denies any chest pains.  Hyponatremia Gently hydrate. Repeat labs in the morning.  History of alcohol abuse Last consumption of alcohol was on Sunday. He is on the alcohol withdrawal protocol. Thiamine. Continue to monitor.  Tobacco abuse On nicotine patch.  History of essential hypertension Continue his home medications. Monitor blood pressures closely  History of anxiety and depression Stable  DVT Prophylaxis: Lovenox    Code Status: Full code   Family Communication: Discussed with the patient   Disposition Plan: Will return home when improved.     LOS: 1 day   [facility] Pager [phone] 10/19/2014, 1:59 PM  If 7PM-7AM, please contact  night-coverage at [email or web address], password [facility]

## 2014-10-19 NOTE — Progress Notes (Signed)
Utilization review completed.  L J Shaheed Schmuck RN, BSN, CM  [phone] 

## 2014-10-19 NOTE — Progress Notes (Signed)
Inpatient Diabetes Program Recommendations  AACE/ADA: New Consensus Statement on Inpatient Glycemic Control (2013)  Target Ranges:  Prepandial:   less than 140 mg/dL      Peak postprandial:   less than 180 mg/dL (1-2 hours)      Critically ill patients:  140 - 180 mg/dL   Results for DORSE, LOCY (MRN [id number]) as of 10/19/2014 10:41  Ref. Range 10/18/2014 18:18  Glucose Latest Ref Range: 65-99 mg/dL 228 (H)   Diabetes history: No Outpatient Diabetes medications: NA Current orders for Inpatient glycemic control: None  Inpatient Diabetes Program Recommendations Correction (SSI): Noted glucose of 228 mg/dl on labs on 10/18/14. While inpatient and ordered steroids, please consider ordering CBGs and Novolog correction scale ACHS.  Thanks, Barnie Alderman, RN, MSN, CCRN, CDE Diabetes Coordinator Inpatient Diabetes Program [phone] (Team Pager from [place] to [place]) [phone] ([facility] office) [phone] [facility] office) [phone] [facility] office)

## 2014-10-20 ENCOUNTER — Inpatient Hospital Stay (HOSPITAL_COMMUNITY): Payer: 59

## 2014-10-20 DIAGNOSIS — F1010 Alcohol abuse, uncomplicated: Secondary | ICD-10-CM

## 2014-10-20 DIAGNOSIS — R55 Syncope and collapse: Secondary | ICD-10-CM

## 2014-10-20 DIAGNOSIS — F411 Generalized anxiety disorder: Secondary | ICD-10-CM

## 2014-10-20 DIAGNOSIS — J441 Chronic obstructive pulmonary disease with (acute) exacerbation: Principal | ICD-10-CM

## 2014-10-20 LAB — CBC
HEMATOCRIT: 41.9 % (ref 39.0–52.0)
Hemoglobin: 14.7 g/dL (ref 13.0–17.0)
MCH: 34.1 pg — AB (ref 26.0–34.0)
MCHC: 35.1 g/dL (ref 30.0–36.0)
MCV: 97.2 fL (ref 78.0–100.0)
Platelets: 182 10*3/uL (ref 150–400)
RBC: 4.31 MIL/uL (ref 4.22–5.81)
RDW: 13.5 % (ref 11.5–15.5)
WBC: 11.2 10*3/uL — ABNORMAL HIGH (ref 4.0–10.5)

## 2014-10-20 LAB — BASIC METABOLIC PANEL
Anion gap: 11 (ref 5–15)
BUN: 11 mg/dL (ref 6–20)
CO2: 25 mmol/L (ref 22–32)
Calcium: 8.7 mg/dL — ABNORMAL LOW (ref 8.9–10.3)
Chloride: 96 mmol/L — ABNORMAL LOW (ref 101–111)
Creatinine, Ser: 0.86 mg/dL (ref 0.61–1.24)
GFR calc Af Amer: >60 mL/min (ref >60)
GFR calc non Af Amer: >60 mL/min (ref >60)
Glucose, Bld: 159 mg/dL — ABNORMAL HIGH (ref 65–99)
POTASSIUM: 3.9 mmol/L (ref 3.5–5.1)
Sodium: 132 mmol/L — ABNORMAL LOW (ref 135–145)

## 2014-10-20 MED ORDER — LEVOFLOXACIN 500 MG PO TABS
500.0000 mg | ORAL_TABLET | ORAL | Status: DC
  Filled 2014-10-20: qty 1

## 2014-10-20 MED ORDER — PREDNISONE 50 MG PO TABS
60.0000 mg | ORAL_TABLET | Freq: Every day | ORAL | Status: DC
  Administered 2014-10-21: 60 mg via ORAL
  Filled 2014-10-20 (×2): qty 1

## 2014-10-20 MED ORDER — DOCUSATE SODIUM 100 MG PO CAPS
100.0000 mg | ORAL_CAPSULE | Freq: Two times a day (BID) | ORAL | Status: DC
Start: 2014-10-20 — End: 2014-10-21
  Administered 2014-10-20 – 2014-10-21 (×3): 100 mg via ORAL
  Filled 2014-10-20 (×3): qty 1

## 2014-10-20 MED ORDER — SENNA 8.6 MG PO TABS
2.0000 | ORAL_TABLET | Freq: Every day | ORAL | Status: DC
  Administered 2014-10-20 – 2014-10-21 (×2): 17.2 mg via ORAL
  Filled 2014-10-20 (×2): qty 2

## 2014-10-20 NOTE — Progress Notes (Signed)
PROGRESS NOTE  KAIYU MIRABAL VZC:[id number] DOB: [birth year] DOA: 10/18/2014 PCP: Claretta Fraise, MD  Brief history  59 year old male with a history of COPD, hyperlipidemia, alcohol abuse, hypertension, asbestosis presented with 2-3 day history of worsening shortness of breath and nonproductive cough with associated wheeze. The patient was seen in his primary care provider's office and was sent to the hospital for further inpatient management.  Assessment/Plan: Acute COPD exacerbation Improving slowly. Continue treatment with steroids, antibiotics, nebulizer treatments.  -10/20/2014--clinically stable on room air -De-escalate to oral steroids  Left lower lobe pneumonia/community-acquired Continue antibiotics as mentioned above. WBC was normal.  Syncope Likely vasovagal secondary to coughing spells.  Monitor on telemetry. EKG shows sinus rhythm with nonspecific T-wave changes. Nothing acutely ischemic. Patient denies any chest pains. -Telemetry without any concerning dysrhythmia -10/20/2014 echocardiogram shows EF 60%, no WMA, normal RV  Hyponatremia Gently hydrate. Repeat labs in the morning. -Improving with IV fluids  History of alcohol abuse Last consumption of alcohol was on Sunday 6/5. He is on the alcohol withdrawal protocol. Thiamine. Continue to monitor. -No signs of withdrawal   Tobacco abuse On nicotine patch. -Continues to smoke -Nearly 80-pack-year history -Tobacco cessation discussed  History of essential hypertension Continue his home medications. Monitor blood pressures closely  History of anxiety and depression Stable  DVT Prophylaxis: Lovenox  Code Status: Full code  Family Communication: Discussed with pt and wife at bedside Disposition Plan: Home 6/9 if stable      Procedures/Studies: Dg Chest 2 View  10/19/2014   CLINICAL DATA:  Cough and COPD  EXAM: CHEST  2 VIEW  COMPARISON:  10/18/2014  FINDINGS: Heart size is normal. No pleural  effusion or edema. Lungs are hyperinflated. There is an opacity within the posterior left base which is increased from previous exam. There is also an anterior left base opacity. This is new from the previous exam compression deformity within the lower thoracic spine is unchanged.  IMPRESSION: 1. Progressive opacities in the left base compatible with pneumonia.   Electronically Signed   By: Kerby Moors M.D.   On: 10/19/2014 07:49   Dg Chest 2 View  10/18/2014   CLINICAL DATA:  Shortness of breath.  Cough and congestion .  EXAM: CHEST  2 VIEW  COMPARISON:  07/12/2014, 07/01/2014, 08/20/2013.  FINDINGS: Mediastinum and hilar structures are normal. Mild left base infiltrate cannot be excluded. No pneumothorax. Mild bilateral pleural thickening noted consistent scarring. Similar findings noted on prior exams. Heart size normal. Degenerative changes thoracic spine.  IMPRESSION: Mild left base infiltrate cannot be excluded.   Electronically Signed   By: Marcello Moores  Register   On: 10/18/2014 12:02         Subjective: Patient is breathing 50-60% better. He still has some mild dyspnea on exertion. Cough has improved. He denies any fevers, chills, chest pain, nausea, vomiting, diarrhea, abdominal pain, dysuria, hematuria, hematochezia, melena.   Objective: Filed Vitals:   10/20/14 0127 10/20/14 0625 10/20/14 0849 10/20/14 1437  BP: 126/84 128/93  131/81  Pulse: 78 75    Temp: 98.1 F (36.7 C) 98.2 F (36.8 C)  98.5 F (36.9 C)  TempSrc: Oral Oral  Oral  Resp: 18 18  20   Height:      Weight:      SpO2: 94% 96% 90% 95%    Intake/Output Summary (Last 24 hours) at 10/20/14 1921 Last data filed at 10/20/14 1300  Gross per 24 hour  Intake 1251.25  ml  Output      0 ml  Net 1251.25 ml   Weight change:  Exam:   General:  Pt is alert, follows commands appropriately, not in acute distress  HEENT: No icterus, No thrush, No neck mass, [place]/AT  Cardiovascular: RRR, S1/S2, no rubs, no  gallops  Respiratory: bibasilar rales without any wheezing   Abdomen: Soft/+BS, non tender, non distended, no guarding; no hepatosplenomegaly    Extremities: No edema, No lymphangitis, No petechiae, No rashes, no synovitis  Data Reviewed: Basic Metabolic Panel:  Recent Labs Lab 10/18/14 1818 10/20/14 0700  NA 131* 132*  K 3.7 3.9  CL 92* 96*  CO2 26 25  GLUCOSE 228* 159*  BUN 7 11  CREATININE 0.91 0.86  CALCIUM 9.2 8.7*   Liver Function Tests:  Recent Labs Lab 10/18/14 1818  AST 32  ALT 33  ALKPHOS 64  BILITOT 0.7  PROT 7.5  ALBUMIN 4.2   No results for input(s): LIPASE, AMYLASE in the last 168 hours. No results for input(s): AMMONIA in the last 168 hours. CBC:  Recent Labs Lab 10/18/14 1117 10/18/14 1818 10/20/14 0700  WBC 6.2 5.5 11.2*  NEUTROABS  --  5.0  --   HGB 15.9 16.4 14.7  HCT 47.7 45.0 41.9  MCV 99.9* 95.7 97.2  PLT  --  169 182   Cardiac Enzymes: No results for input(s): CKTOTAL, CKMB, CKMBINDEX, TROPONINI in the last 168 hours. BNP: Invalid input(s): POCBNP CBG: No results for input(s): GLUCAP in the last 168 hours.  No results found for this or any previous visit (from the past 240 hour(s)).   Scheduled Meds: . aspirin  81 mg Oral Daily  . dextromethorphan-guaiFENesin  1 tablet Oral BID  . docusate sodium  100 mg Oral BID  . enoxaparin (LOVENOX) injection  40 mg Subcutaneous Q24H  . escitalopram  20 mg Oral Daily  . folic acid  1 mg Oral Daily  . ipratropium-albuterol  3 mL Nebulization TID  . irbesartan  150 mg Oral Daily  . [START ON 10/21/2014] levofloxacin  500 mg Oral Every 24 Hours  . levothyroxine  25 mcg Oral QAC breakfast  . mometasone-formoterol  2 puff Inhalation BID  . multivitamin with minerals  1 tablet Oral Daily  . nicotine  14 mg Transdermal Daily  . NIFEdipine  90 mg Oral Daily  . [START ON 10/21/2014] predniSONE  60 mg Oral Q breakfast  . senna  2 tablet Oral Daily  . sodium chloride  3 mL Intravenous Q12H  .  thiamine  100 mg Oral Daily  . vitamin B-12  250 mcg Oral Daily   Continuous Infusions:    Jailynn Lavalais, DO  [facility] Pager [phone]  If 7PM-7AM, please contact night-coverage [email or web address] Password [email or web address] 10/20/2014, 7:21 PM   LOS: 2 days

## 2014-10-20 NOTE — Care Management Note (Signed)
Case Management Note  Patient Details  Name: Frank Rosales MRN: [id number] Date of Birth: 10-31-1955  Subjective/Objective:       Patient lives with spouse, NCM will cont to follow for dc needs.             Action/Plan:   Expected Discharge Date:                  Expected Discharge Plan:  Home/Self Care  In-House Referral:     Discharge planning Services  CM Consult  Post Acute Care Choice:    Choice offered to:     DME Arranged:    DME Agency:     HH Arranged:    HH Agency:     Status of Service:  In process, will continue to follow  Medicare Important Message Given:  No Date Medicare IM Given:    Medicare IM give by:    Date Additional Medicare IM Given:    Additional Medicare Important Message give by:     If discussed at [place] of Stay Meetings, dates discussed:    Additional Comments:  Zenon Mayo, RN 10/20/2014, 3:32 PM

## 2014-10-20 NOTE — Progress Notes (Signed)
  Echocardiogram 2D Echocardiogram has been performed.  Frank Rosales [name] 10/20/2014, 2:18 PM

## 2014-10-21 DIAGNOSIS — F1910 Other psychoactive substance abuse, uncomplicated: Secondary | ICD-10-CM

## 2014-10-21 LAB — BASIC METABOLIC PANEL
ANION GAP: 10 (ref 5–15)
BUN: 14 mg/dL (ref 6–20)
CHLORIDE: 100 mmol/L — AB (ref 101–111)
CO2: 26 mmol/L (ref 22–32)
Calcium: 8.5 mg/dL — ABNORMAL LOW (ref 8.9–10.3)
Creatinine, Ser: 0.79 mg/dL (ref 0.61–1.24)
GFR calc Af Amer: >60 mL/min (ref >60)
Glucose, Bld: 113 mg/dL — ABNORMAL HIGH (ref 65–99)
Potassium: 3.7 mmol/L (ref 3.5–5.1)
Sodium: 136 mmol/L (ref 135–145)

## 2014-10-21 MED ORDER — PREDNISONE 10 MG PO TABS: 50.0000 mg | ORAL_TABLET | Freq: Every day | ORAL | Status: DC

## 2014-10-21 MED ORDER — LEVOFLOXACIN 500 MG PO TABS: 500.0000 mg | ORAL_TABLET | Freq: Every day | ORAL | Status: DC

## 2014-10-21 NOTE — Progress Notes (Signed)
Patient was discharged home by MD order; discharged instructions review and give to patient with care notes; IV DIC;  patient will be escorted to the car by nurse tech via wheelchair.  

## 2014-10-21 NOTE — Discharge Summary (Addendum)
Physician Discharge Summary  Frank Rosales YDX:[id number] DOB: 09-27-1955 DOA: 10/18/2014  PCP: Claretta Fraise, MD  Admit date: 10/18/2014 Discharge date: 10/21/2014  Recommendations for Outpatient Follow-up:  1. Pt will need to follow up with PCP in 2 weeks post discharge 2. Please obtain BMP in one weeki  Discharge Diagnoses:  Acute COPD exacerbation Improving slowly. Continue treatment with steroids, antibiotics, nebulizer treatments.  -10/20/2014--clinically stable on room air -10/20/14--De-escalate to oral steroids -home with prednisone 50mg  daily and decrease by 10mg  daily -Patient has significant occupational exposures that may contribute to chronic lung disease. -I have advised the patient to follow-up with his pulmonologist after he is clinically stable in the outpatient setting. -Ambulatory pulse oximetry did not reveal any desaturation  Left lower lobe pneumonia/community-acquired Continue antibiotics as mentioned above. WBC was normal. -afebrile and hemodynamically stable -home with 4 more days of levofloxaxin 500mg  to complete 7 days of therapy  Syncope Likely vasovagal secondary to coughing spells. Monitor on telemetry. EKG shows sinus rhythm with nonspecific T-wave changes. Nothing acutely ischemic. Patient denies any chest pains. -Telemetry without any concerning dysrhythmia -10/20/2014 echocardiogram shows EF 60%, no WMA, normal RV  Hyponatremia Gently hydrate. Repeat labs in the morning. -Improving with IV fluids -Na 136 on day of d/c  History of alcohol abuse Last consumption of alcohol was on Sunday 6/5. He is on the alcohol withdrawal protocol. Thiamine. Continue to monitor. -No signs of withdrawal   Tobacco abuse On nicotine patch. -Continues to smoke -Nearly 80-pack-year history -Tobacco cessation discussed  History of essential hypertension Continue his home medications. Monitor blood pressures closely  History of anxiety and  depression Stable -continue xanax and lexapro  Discharge Condition: stable  Disposition: home  Diet:heart healthy Wt Readings from Last 3 Encounters:  10/18/14 74.98 kg (165 lb 4.8 oz)  10/18/14 77.111 kg (170 lb)  10/18/14 77.111 kg (170 lb)    History of present illness:   59 year old male with a history of COPD, hyperlipidemia, alcohol abuse, hypertension, asbestosis presented with 2-3 day history of worsening shortness of breath and nonproductive cough with associated wheeze. The patient was seen in his primary care provider's office and was sent to the hospital for further inpatient management. The patient was started on intravenous antibiotics and intravenous steroids. The patient improved clinically. The patient was subsequently weaned off oxygen. He was D escalated to oral steroidal. He remained afebrile and hemodynamically stable. The patient will go home with 4 more days of levofloxacin and prednisone taper as discussed above.   Discharge Exam: Filed Vitals:   10/21/14 0547  BP: 154/84  Pulse: 69  Temp: 97.7 F (36.5 C)  Resp: 18   Filed Vitals:   10/20/14 2152 10/21/14 0250 10/21/14 0547 10/21/14 0919  BP:  129/90 154/84   Pulse:  84 69   Temp:  98.2 F (36.8 C) 97.7 F (36.5 C)   TempSrc:  Oral Oral   Resp:  18 18   Height:      Weight:      SpO2: 94% 94% 92% 94%   General: A&O x 3, NAD, pleasant, cooperative Cardiovascular: RRR, no rub, no gallop, no S3 Respiratory: Bibasilar rales without wheezes Abdomen:soft, nontender, nondistended, positive bowel sounds Extremities: No edema, No lymphangitis, no petechiae  Discharge Instructions      Discharge Instructions    Diet - low sodium heart healthy    Complete by:  As directed      Increase activity slowly    Complete by:  As directed  Medication List    STOP taking these medications        cyclobenzaprine 5 MG tablet  Commonly known as:  FLEXERIL      TAKE these medications         albuterol 108 (90 BASE) MCG/ACT inhaler  Commonly known as:  PROAIR HFA  Inhale 2 puffs into the lungs every 6 (six) hours as needed for wheezing or shortness of breath.     ALPRAZolam 0.5 MG tablet  Commonly known as:  XANAX  Take 1 tablet (0.5 mg total) by mouth 3 (three) times daily as needed.     aspirin 81 MG tablet  Take 81 mg by mouth daily. Hold's medication prior to spinal injections     cholecalciferol 1000 UNITS tablet  Commonly known as:  VITAMIN D  Take 1,000 Units by mouth daily.     escitalopram 20 MG tablet  Commonly known as:  LEXAPRO  Take 1 tablet (20 mg total) by mouth daily.     Fluticasone-Salmeterol 250-50 MCG/DOSE Aepb  Commonly known as:  ADVAIR DISKUS  INHALE 1 PUFF 2 TIMES DAILY     folic acid 1 MG tablet  Commonly known as:  FOLVITE  Take 1 tablet (1 mg total) by mouth daily.     levofloxacin 500 MG tablet  Commonly known as:  LEVAQUIN  Take 1 tablet (500 mg total) by mouth daily.     levothyroxine 25 MCG tablet  Commonly known as:  SYNTHROID, LEVOTHROID  TAKE 1 TABLET (25 MCG TOTAL) BY MOUTH DAILY.     meloxicam 15 MG tablet  Commonly known as:  MOBIC  Take 1 tablet (15 mg total) by mouth daily.     NIFEdipine 90 MG 24 hr tablet  Commonly known as:  PROCARDIA XL/ADALAT-CC  TAKE 1 TABLET (90 MG TOTAL) BY MOUTH DAILY.     OMEGA-3 KRILL OIL PO  Take 1 capsule by mouth daily.     predniSONE 10 MG tablet  Commonly known as:  DELTASONE  Take 5 tablets (50 mg total) by mouth daily with breakfast. Start 10/22/14 and decrease by one tablet daily  Start taking on:  10/22/2014     valsartan-hydrochlorothiazide 320-25 MG per tablet  Commonly known as:  DIOVAN-HCT  TAKE 1 TABLET BY MOUTH DAILY.     vitamin B-12 250 MCG tablet  Commonly known as:  CYANOCOBALAMIN  Take 250 mcg by mouth daily.         The results of significant diagnostics from this hospitalization (including imaging, microbiology, ancillary and laboratory) are listed  below for reference.    Significant Diagnostic Studies: Dg Chest 2 View  10/19/2014   CLINICAL DATA:  Cough and COPD  EXAM: CHEST  2 VIEW  COMPARISON:  10/18/2014  FINDINGS: Heart size is normal. No pleural effusion or edema. Lungs are hyperinflated. There is an opacity within the posterior left base which is increased from previous exam. There is also an anterior left base opacity. This is new from the previous exam compression deformity within the lower thoracic spine is unchanged.  IMPRESSION: 1. Progressive opacities in the left base compatible with pneumonia.   Electronically Signed   By: Kerby Moors M.D.   On: 10/19/2014 07:49   Dg Chest 2 View  10/18/2014   CLINICAL DATA:  Shortness of breath.  Cough and congestion .  EXAM: CHEST  2 VIEW  COMPARISON:  07/12/2014, 07/01/2014, 08/20/2013.  FINDINGS: Mediastinum and hilar structures are normal. Mild left base infiltrate  cannot be excluded. No pneumothorax. Mild bilateral pleural thickening noted consistent scarring. Similar findings noted on prior exams. Heart size normal. Degenerative changes thoracic spine.  IMPRESSION: Mild left base infiltrate cannot be excluded.   Electronically Signed   By: Marcello Moores  Register   On: 10/18/2014 12:02     Microbiology: No results found for this or any previous visit (from the past 240 hour(s)).   Labs: Basic Metabolic Panel:  Recent Labs Lab 10/18/14 1818 10/20/14 0700 10/21/14 0536  NA 131* 132* 136  K 3.7 3.9 3.7  CL 92* 96* 100*  CO2 26 25 26   GLUCOSE 228* 159* 113*  BUN 7 11 14   CREATININE 0.91 0.86 0.79  CALCIUM 9.2 8.7* 8.5*   Liver Function Tests:  Recent Labs Lab 10/18/14 1818  AST 32  ALT 33  ALKPHOS 64  BILITOT 0.7  PROT 7.5  ALBUMIN 4.2   No results for input(s): LIPASE, AMYLASE in the last 168 hours. No results for input(s): AMMONIA in the last 168 hours. CBC:  Recent Labs Lab 10/18/14 1117 10/18/14 1818 10/20/14 0700  WBC 6.2 5.5 11.2*  NEUTROABS  --  5.0  --    HGB 15.9 16.4 14.7  HCT 47.7 45.0 41.9  MCV 99.9* 95.7 97.2  PLT  --  169 182   Cardiac Enzymes: No results for input(s): CKTOTAL, CKMB, CKMBINDEX, TROPONINI in the last 168 hours. BNP: Invalid input(s): POCBNP CBG: No results for input(s): GLUCAP in the last 168 hours.  Time coordinating discharge:  Greater than 30 minutes  Signed:  Aiesha Leland, DO [facility] Pager: [phone] 10/21/2014, 9:37 AM

## 2014-10-27 ENCOUNTER — Encounter: Payer: Self-pay | Admitting: Family Medicine

## 2014-10-27 ENCOUNTER — Ambulatory Visit (INDEPENDENT_AMBULATORY_CARE_PROVIDER_SITE_OTHER): Payer: 59 | Admitting: Family Medicine

## 2014-10-27 ENCOUNTER — Ambulatory Visit (INDEPENDENT_AMBULATORY_CARE_PROVIDER_SITE_OTHER): Payer: 59

## 2014-10-27 VITALS — BP 123/77 | HR 75 | Temp 97.6°F | Ht 69.0 in | Wt 175.0 lb

## 2014-10-27 DIAGNOSIS — J441 Chronic obstructive pulmonary disease with (acute) exacerbation: Secondary | ICD-10-CM | POA: Diagnosis not present

## 2014-10-27 DIAGNOSIS — F17200 Nicotine dependence, unspecified, uncomplicated: Secondary | ICD-10-CM

## 2014-10-27 DIAGNOSIS — I10 Essential (primary) hypertension: Secondary | ICD-10-CM | POA: Diagnosis not present

## 2014-10-27 DIAGNOSIS — Z720 Tobacco use: Secondary | ICD-10-CM | POA: Diagnosis not present

## 2014-10-27 DIAGNOSIS — E039 Hypothyroidism, unspecified: Secondary | ICD-10-CM

## 2014-10-27 DIAGNOSIS — F411 Generalized anxiety disorder: Secondary | ICD-10-CM

## 2014-10-27 MED ORDER — ALPRAZOLAM 0.5 MG PO TABS: 0.5000 mg | ORAL_TABLET | Freq: Three times a day (TID) | ORAL | Status: DC | PRN

## 2014-10-27 NOTE — Progress Notes (Signed)
Subjective:  Patient ID: Frank Rosales, male    DOB: [birth year]  Age: 59 y.o. MRN: [id number]  CC: Hospitalization Follow-up   HPI Frank Rosales presents for follow-up from hospitalization. He stayed in [facility] 3 days after his last visit. See that report which was reviewed. Also note that he was weaned from oxygen during his hospital stay. He went through a 5 day taper. Hemorrhoids after finishing IV steroid-dependent Hospital. He was continued on antibiotic's last dose being yesterday. Currently his cough continues but is nonproductive. He is able to do his normal activities without shortness of breath with the exception of going to work. Of note is that his work is highly stressful and taxing from a respiratory standpoint. He was advised to see home and immunology for that purpose to evaluate his ability to continue at work. He did some walking outdoors a couple of days ago and was not winded. He has been trying to quit smoking and does not at this time want a prescription. He is willing to use the patch. He states that treating his anxiety is useful at helping him to stop smoking as well as he reaches for cigarettes when he is anxious.  Patient presents for follow-up on  thyroid. She has a history of hypothyroidism for many years. It has been stable recently. Pt. denies any change in  voice, loss of hair, heat or cold intolerance. Energy level has been adequate to good. She denies constipation and diarrhea. No myxedema. Medication is as noted below. Verified that pt is taking it daily on an empty stomach. Well tolerated.  History Frank Rosales has a past medical history of High blood pressure; Asthma; High cholesterol; Hilar adenopathy; COPD (chronic obstructive pulmonary disease); Anxiety; Colon polyps; Hypothyroidism; and Arthritis.   He has past surgical history that includes Knee arthroscopy (Right, 05/14/2010); Wisdom tooth extraction; epidural injections; Fixation kyphoplasty lumbar  spine; and Back surgery.   His family history includes Emphysema in his father; Heart disease in his brother; Heart failure in his mother; Lung cancer in his father.He reports that he has been smoking Cigarettes.  He started smoking about 41 years ago. He has a 70 pack-year smoking history. He has never used smokeless tobacco. He reports that he drinks about 25.2 oz of alcohol per week. He reports that he does not use illicit drugs.  Outpatient Prescriptions Prior to Visit  Medication Sig Dispense Refill  . albuterol (PROAIR HFA) 108 (90 BASE) MCG/ACT inhaler Inhale 2 puffs into the lungs every 6 (six) hours as needed for wheezing or shortness of breath. 8 g 11  . aspirin 81 MG tablet Take 81 mg by mouth daily. Hold's medication prior to spinal injections    . cholecalciferol (VITAMIN D) 1000 UNITS tablet Take 1,000 Units by mouth daily.     Marland Kitchen escitalopram (LEXAPRO) 20 MG tablet Take 1 tablet (20 mg total) by mouth daily. 90 tablet 0  . Fluticasone-Salmeterol (ADVAIR DISKUS) 250-50 MCG/DOSE AEPB INHALE 1 PUFF 2 TIMES DAILY 798 each 11  . folic acid (FOLVITE) 1 MG tablet Take 1 tablet (1 mg total) by mouth daily. 90 tablet 3  . levothyroxine (SYNTHROID, LEVOTHROID) 25 MCG tablet TAKE 1 TABLET (25 MCG TOTAL) BY MOUTH DAILY. 90 tablet 2  . meloxicam (MOBIC) 15 MG tablet Take 1 tablet (15 mg total) by mouth daily. 30 tablet 5  . NIFEdipine (PROCARDIA XL/ADALAT-CC) 90 MG 24 hr tablet TAKE 1 TABLET (90 MG TOTAL) BY MOUTH DAILY. 90 tablet 0  .  OMEGA-3 KRILL OIL PO Take 1 capsule by mouth daily.    . valsartan-hydrochlorothiazide (DIOVAN-HCT) 320-25 MG per tablet TAKE 1 TABLET BY MOUTH DAILY. 90 tablet 0  . vitamin B-12 (CYANOCOBALAMIN) 250 MCG tablet Take 250 mcg by mouth daily.    Marland Kitchen ALPRAZolam (XANAX) 0.5 MG tablet Take 1 tablet (0.5 mg total) by mouth 3 (three) times daily as needed. (Patient taking differently: Take 0.5 mg by mouth 3 (three) times daily as needed for anxiety. ) 90 tablet 3  .  levofloxacin (LEVAQUIN) 500 MG tablet Take 1 tablet (500 mg total) by mouth daily. (Patient not taking: Reported on 10/27/2014) 4 tablet 0  . predniSONE (DELTASONE) 10 MG tablet Take 5 tablets (50 mg total) by mouth daily with breakfast. Start 10/22/14 and decrease by one tablet daily (Patient not taking: Reported on 10/27/2014) 15 tablet 0   No facility-administered medications prior to visit.    ROS Review of Systems  Constitutional: Negative for fever, chills and diaphoresis.  HENT: Negative for congestion, rhinorrhea and sore throat.   Respiratory: Negative for cough, shortness of breath and wheezing.   Cardiovascular: Negative for chest pain.  Gastrointestinal: Negative for nausea, vomiting, abdominal pain, diarrhea, constipation and abdominal distention.  Genitourinary: Negative for dysuria and frequency.  Musculoskeletal: Negative for joint swelling and arthralgias.  Skin: Negative for rash.  Neurological: Negative for headaches.    Objective:  BP 123/77 mmHg  Pulse 75  Temp(Src) 97.6 F (36.4 C) (Oral)  Ht 5' 9"  (1.753 m)  Wt 175 lb (79.379 kg)  BMI 25.83 kg/m2  BP Readings from Last 3 Encounters:  10/27/14 123/77  10/21/14 154/84  10/19/14 147/89    Wt Readings from Last 3 Encounters:  10/27/14 175 lb (79.379 kg)  10/18/14 165 lb 4.8 oz (74.98 kg)  10/18/14 170 lb (77.111 kg)     Physical Exam  Constitutional: He is oriented to person, place, and time. He appears well-developed and well-nourished. No distress.  HENT:  Head: Normocephalic and atraumatic.  Right Ear: External ear normal.  Left Ear: External ear normal.  Nose: Nose normal.  Mouth/Throat: Oropharynx is clear and moist.  Eyes: Conjunctivae and EOM are normal. Pupils are equal, round, and reactive to light.  Neck: Normal range of motion. Neck supple. No thyromegaly present.  Cardiovascular: Normal rate, regular rhythm and normal heart sounds.   No murmur heard. Pulmonary/Chest: Effort normal and  breath sounds normal. No respiratory distress. He has no wheezes. He has no rales.  Abdominal: Soft. Bowel sounds are normal. He exhibits no distension. There is no tenderness.  Lymphadenopathy:    He has no cervical adenopathy.  Neurological: He is alert and oriented to person, place, and time. He has normal reflexes.  Skin: Skin is warm and dry.  Psychiatric: He has a normal mood and affect. His behavior is normal. Judgment and thought content normal.    No results found for: HGBA1C  Lab Results  Component Value Date   WBC 11.2* 10/20/2014   HGB 14.7 10/20/2014   HCT 41.9 10/20/2014   PLT 182 10/20/2014   GLUCOSE 113* 10/21/2014   CHOL 220* 12/03/2013   TRIG 99 12/03/2013   HDL 75 12/03/2013   LDLCALC 125* 12/03/2013   ALT 33 10/18/2014   AST 32 10/18/2014   NA 136 10/21/2014   K 3.7 10/21/2014   CL 100* 10/21/2014   CREATININE 0.79 10/21/2014   BUN 14 10/21/2014   CO2 26 10/21/2014   TSH 2.230 12/03/2013  PSA 0.9 02/06/2013   INR 0.98 10/20/2010    Dg Chest 2 View  10/19/2014   CLINICAL DATA:  Cough and COPD  EXAM: CHEST  2 VIEW  COMPARISON:  10/18/2014  FINDINGS: Heart size is normal. No pleural effusion or edema. Lungs are hyperinflated. There is an opacity within the posterior left base which is increased from previous exam. There is also an anterior left base opacity. This is new from the previous exam compression deformity within the lower thoracic spine is unchanged.  IMPRESSION: 1. Progressive opacities in the left base compatible with pneumonia.   Electronically Signed   By: Kerby Moors M.D.   On: 10/19/2014 07:49   Dg Chest 2 View  10/18/2014   CLINICAL DATA:  Shortness of breath.  Cough and congestion .  EXAM: CHEST  2 VIEW  COMPARISON:  07/12/2014, 07/01/2014, 08/20/2013.  FINDINGS: Mediastinum and hilar structures are normal. Mild left base infiltrate cannot be excluded. No pneumothorax. Mild bilateral pleural thickening noted consistent scarring. Similar  findings noted on prior exams. Heart size normal. Degenerative changes thoracic spine.  IMPRESSION: Mild left base infiltrate cannot be excluded.   Electronically Signed   By: Marcello Moores  Register   On: 10/18/2014 12:02    Assessment & Plan:   Frank Rosales was seen today for hospitalization follow-up.  Diagnoses and all orders for this visit:  Generalized anxiety disorder Orders: -     ALPRAZolam (XANAX) 0.5 MG tablet; Take 1 tablet (0.5 mg total) by mouth 3 (three) times daily as needed for anxiety. -     BMP8+EGFR -     DG Chest 2 View  COPD exacerbation  Tobacco use disorder  Essential hypertension  Hypothyroidism, unspecified hypothyroidism type Orders: -     T4, free   I have discontinued Mr. Cozzolino levofloxacin and predniSONE. I have also changed his ALPRAZolam. Additionally, I am having him maintain his aspirin, OMEGA-3 KRILL OIL PO, cholecalciferol, vitamin Z-61, folic acid, meloxicam, levothyroxine, Fluticasone-Salmeterol, albuterol, NIFEdipine, escitalopram, and valsartan-hydrochlorothiazide.  Meds ordered this encounter  Medications  . ALPRAZolam (XANAX) 0.5 MG tablet    Sig: Take 1 tablet (0.5 mg total) by mouth 3 (three) times daily as needed for anxiety.    Dispense:  90 tablet    Refill:  3     Follow-up: Return in about 3 months (around 01/27/2015).  Claretta Fraise, M.D.

## 2014-10-28 LAB — BMP8+EGFR
BUN/Creatinine Ratio: 11 (ref 9–20)
BUN: 10 mg/dL (ref 6–24)
CALCIUM: 9.3 mg/dL (ref 8.7–10.2)
CHLORIDE: 94 mmol/L — AB (ref 97–108)
CO2: 24 mmol/L (ref 18–29)
Creatinine, Ser: 0.87 mg/dL (ref 0.76–1.27)
GFR calc Af Amer: 112 mL/min/{1.73_m2} (ref >59)
GFR, EST NON AFRICAN AMERICAN: 96 mL/min/{1.73_m2} (ref >59)
Glucose: 111 mg/dL — ABNORMAL HIGH (ref 65–99)
Potassium: 4.4 mmol/L (ref 3.5–5.2)
Sodium: 137 mmol/L (ref 134–144)

## 2014-11-01 ENCOUNTER — Other Ambulatory Visit: Payer: Self-pay | Admitting: Nurse Practitioner

## 2014-11-08 ENCOUNTER — Ambulatory Visit (INDEPENDENT_AMBULATORY_CARE_PROVIDER_SITE_OTHER): Payer: 59 | Admitting: Family Medicine

## 2014-11-08 ENCOUNTER — Encounter: Payer: Self-pay | Admitting: Family Medicine

## 2014-11-08 VITALS — BP 121/80 | HR 101 | Temp 98.3°F | Ht 69.0 in | Wt 176.4 lb

## 2014-11-08 DIAGNOSIS — J431 Panlobular emphysema: Secondary | ICD-10-CM

## 2014-11-08 DIAGNOSIS — F17200 Nicotine dependence, unspecified, uncomplicated: Secondary | ICD-10-CM

## 2014-11-08 DIAGNOSIS — M48061 Spinal stenosis, lumbar region without neurogenic claudication: Secondary | ICD-10-CM

## 2014-11-08 DIAGNOSIS — M4806 Spinal stenosis, lumbar region: Secondary | ICD-10-CM | POA: Diagnosis not present

## 2014-11-08 DIAGNOSIS — M5030 Other cervical disc degeneration, unspecified cervical region: Secondary | ICD-10-CM | POA: Diagnosis not present

## 2014-11-08 MED ORDER — NICOTINE 7 MG/24HR TD PT24: 7.0000 mg | MEDICATED_PATCH | Freq: Every day | TRANSDERMAL | Status: DC

## 2014-11-08 NOTE — Progress Notes (Signed)
Subjective:  Patient ID: RYETT HAMMAN, male    DOB: 1956-02-23  Age: 59 y.o. MRN: [id number]  CC: COPD   HPI AARYA QUEBEDEAUX presents for disability determination. Due to his COPD has a restriction involving wearing any type of respirator. Additionally he should not be exposed to excessive fumes or dust. His employer states that he cannot come back to work with those restrictions. Unfortunately those restrictions are legitimate and permanent therefore the patient is in today to determine his next course of action. He does get short of breath with mild-to-moderate exertion. Additionally he has to climb ladders and stairs at work and he has knee pain and back pain and neck pain seen by specialists that is also debilitating. The right knee has been evaluated for replacement and that is a viable option. However he is not eligible for back or neck surgery although he does have significant degeneration in the disks and significant pain. This is because his pulmonary specialist, Dr. Joya Gaskins, has told him that his COPD is too severe to allow for those surgeries. He has 2 well in an overhead position types with his impossible due to excessive neck extension that he cannot perform. Additionally he has to twist from side to side and his neck and back will not allow for those motions without intensifying his pain. He is required to climb ladders and steps that are painful due to his knee problems.  Patient is dependent on tobacco. He has tapered down to 2-4 cigarettes daily. He would like some pharmacologic intervention to help him go complete his program of tobacco cessation.  History Kharter has a past medical history of High blood pressure; Asthma; High cholesterol; Hilar adenopathy; COPD (chronic obstructive pulmonary disease); Anxiety; Colon polyps; Hypothyroidism; and Arthritis.   He has past surgical history that includes Knee arthroscopy (Right, 05/14/2010); Wisdom tooth extraction; epidural injections;  Fixation kyphoplasty lumbar spine; and Back surgery.   His family history includes Emphysema in his father; Heart disease in his brother; Heart failure in his mother; Lung cancer in his father.He reports that he has been smoking Cigarettes.  He started smoking about 41 years ago. He has a 70 pack-year smoking history. He has never used smokeless tobacco. He reports that he drinks about 25.2 oz of alcohol per week. He reports that he does not use illicit drugs.  Outpatient Prescriptions Prior to Visit  Medication Sig Dispense Refill  . albuterol (PROAIR HFA) 108 (90 BASE) MCG/ACT inhaler Inhale 2 puffs into the lungs every 6 (six) hours as needed for wheezing or shortness of breath. 8 g 11  . ALPRAZolam (XANAX) 0.5 MG tablet Take 1 tablet (0.5 mg total) by mouth 3 (three) times daily as needed for anxiety. 90 tablet 3  . aspirin 81 MG tablet Take 81 mg by mouth daily. Hold's medication prior to spinal injections    . cholecalciferol (VITAMIN D) 1000 UNITS tablet Take 1,000 Units by mouth daily.     Marland Kitchen escitalopram (LEXAPRO) 20 MG tablet Take 1 tablet (20 mg total) by mouth daily. 90 tablet 0  . Fluticasone-Salmeterol (ADVAIR DISKUS) 250-50 MCG/DOSE AEPB INHALE 1 PUFF 2 TIMES DAILY 144 each 11  . folic acid (FOLVITE) 1 MG tablet Take 1 tablet (1 mg total) by mouth daily. 90 tablet 3  . levothyroxine (SYNTHROID, LEVOTHROID) 25 MCG tablet TAKE 1 TABLET (25 MCG TOTAL) BY MOUTH DAILY. 90 tablet 2  . meloxicam (MOBIC) 15 MG tablet Take 1 tablet (15 mg total) by mouth  daily. 30 tablet 5  . NIFEdipine (ADALAT CC) 90 MG 24 hr tablet TAKE 1 TABLET (90 MG TOTAL) BY MOUTH DAILY. 90 tablet 0  . OMEGA-3 KRILL OIL PO Take 1 capsule by mouth daily.    . valsartan-hydrochlorothiazide (DIOVAN-HCT) 320-25 MG per tablet TAKE 1 TABLET BY MOUTH DAILY. 90 tablet 0  . vitamin B-12 (CYANOCOBALAMIN) 250 MCG tablet Take 250 mcg by mouth daily.     No facility-administered medications prior to visit.    ROS Review of  Systems  Constitutional: Negative for fever, chills and diaphoresis.  HENT: Negative for congestion, rhinorrhea and sore throat.   Respiratory: Positive for cough, chest tightness, shortness of breath and wheezing.   Cardiovascular: Negative for chest pain.  Gastrointestinal: Negative for nausea, vomiting, abdominal pain, diarrhea, constipation and abdominal distention.  Genitourinary: Negative for dysuria and frequency.  Musculoskeletal: Positive for myalgias, back pain, joint swelling, arthralgias, gait problem, neck pain and neck stiffness.  Skin: Negative for rash.  Allergic/Immunologic: Negative.   Neurological: Negative for syncope, speech difficulty and headaches.    Objective:  BP 121/80 mmHg  Pulse 101  Temp(Src) 98.3 F (36.8 C) (Oral)  Ht 5\' 9"  (1.753 m)  Wt 176 lb 6.4 oz (80.015 kg)  BMI 26.04 kg/m2  BP Readings from Last 3 Encounters:  11/08/14 121/80  10/27/14 123/77  10/21/14 154/84    Wt Readings from Last 3 Encounters:  11/08/14 176 lb 6.4 oz (80.015 kg)  10/27/14 175 lb (79.379 kg)  10/18/14 165 lb 4.8 oz (74.98 kg)     Physical Exam  Constitutional: He is oriented to person, place, and time. He appears well-developed and well-nourished. No distress.  HENT:  Head: Normocephalic and atraumatic.  Right Ear: External ear normal.  Left Ear: External ear normal.  Nose: Nose normal.  Mouth/Throat: Oropharynx is clear and moist.  Eyes: Conjunctivae and EOM are normal. Pupils are equal, round, and reactive to light.  Neck: Normal range of motion. Neck supple. No thyromegaly present.  Cardiovascular: Normal rate, regular rhythm and normal heart sounds.   No murmur heard. Pulmonary/Chest: Effort normal. No respiratory distress. He has wheezes. He has no rales.  Breath sounds are distant with decreased expiratory phase  Abdominal: Soft. Bowel sounds are normal. He exhibits no distension. There is no tenderness.  Lymphadenopathy:    He has no cervical  adenopathy.  Neurological: He is alert and oriented to person, place, and time. He has normal reflexes.  Skin: Skin is warm and dry.  Psychiatric: He has a normal mood and affect. His behavior is normal. Judgment and thought content normal.    No results found for: HGBA1C  Lab Results  Component Value Date   WBC 11.2* 10/20/2014   HGB 14.7 10/20/2014   HCT 41.9 10/20/2014   PLT 182 10/20/2014   GLUCOSE 111* 10/27/2014   CHOL 220* 12/03/2013   TRIG 99 12/03/2013   HDL 75 12/03/2013   LDLCALC 125* 12/03/2013   ALT 33 10/18/2014   AST 32 10/18/2014   NA 137 10/27/2014   K 4.4 10/27/2014   CL 94* 10/27/2014   CREATININE 0.87 10/27/2014   BUN 10 10/27/2014   CO2 24 10/27/2014   TSH 2.230 12/03/2013   PSA 0.9 02/06/2013   INR 0.98 10/20/2010    Dg Chest 2 View  10/19/2014   CLINICAL DATA:  Cough and COPD  EXAM: CHEST  2 VIEW  COMPARISON:  10/18/2014  FINDINGS: Heart size is normal. No pleural effusion or edema.  Lungs are hyperinflated. There is an opacity within the posterior left base which is increased from previous exam. There is also an anterior left base opacity. This is new from the previous exam compression deformity within the lower thoracic spine is unchanged.  IMPRESSION: 1. Progressive opacities in the left base compatible with pneumonia.   Electronically Signed   By: Kerby Moors M.D.   On: 10/19/2014 07:49   Dg Chest 2 View  10/18/2014   CLINICAL DATA:  Shortness of breath.  Cough and congestion .  EXAM: CHEST  2 VIEW  COMPARISON:  07/12/2014, 07/01/2014, 08/20/2013.  FINDINGS: Mediastinum and hilar structures are normal. Mild left base infiltrate cannot be excluded. No pneumothorax. Mild bilateral pleural thickening noted consistent scarring. Similar findings noted on prior exams. Heart size normal. Degenerative changes thoracic spine.  IMPRESSION: Mild left base infiltrate cannot be excluded.   Electronically Signed   By: Marcello Moores  Register   On: 10/18/2014 12:02     Assessment & Plan:   There are no diagnoses linked to this encounter. I am having Mr. Hodes start on nicotine. I am also having him maintain his aspirin, OMEGA-3 KRILL OIL PO, cholecalciferol, vitamin U-54, folic acid, meloxicam, levothyroxine, Fluticasone-Salmeterol, albuterol, escitalopram, valsartan-hydrochlorothiazide, ALPRAZolam, and NIFEdipine.  Meds ordered this encounter  Medications  . nicotine (NICODERM CQ) 7 mg/24hr patch    Sig: Place 1 patch (7 mg total) onto the skin daily.    Dispense:  28 patch    Refill:  0   Now the patient will self refer back to his specialist in orthopedics, neurosurgery and pulmonology for their evaluations for his disability. Currently he should remain on disability. His short-term is good for 6 months. During that time, he needs to apply for his long-term disability. It is my opinion that he is 100% disabled from this time forward due to his COPD arthritis and degenerative disc disease.  Follow-up: No Follow-up on file.  Claretta Fraise, M.D.

## 2014-11-09 ENCOUNTER — Ambulatory Visit: Payer: 59 | Admitting: Pulmonary Disease

## 2014-11-10 ENCOUNTER — Ambulatory Visit (INDEPENDENT_AMBULATORY_CARE_PROVIDER_SITE_OTHER): Payer: 59 | Admitting: Internal Medicine

## 2014-11-10 ENCOUNTER — Encounter: Payer: Self-pay | Admitting: Internal Medicine

## 2014-11-10 VITALS — BP 94/64 | HR 103 | Ht 70.0 in | Wt 174.0 lb

## 2014-11-10 DIAGNOSIS — Z720 Tobacco use: Secondary | ICD-10-CM | POA: Diagnosis not present

## 2014-11-10 DIAGNOSIS — J449 Chronic obstructive pulmonary disease, unspecified: Secondary | ICD-10-CM | POA: Diagnosis not present

## 2014-11-10 DIAGNOSIS — F17210 Nicotine dependence, cigarettes, uncomplicated: Secondary | ICD-10-CM

## 2014-11-10 MED ORDER — BUDESONIDE-FORMOTEROL FUMARATE 160-4.5 MCG/ACT IN AERO: INHALATION_SPRAY | RESPIRATORY_TRACT | Status: DC

## 2014-11-10 NOTE — Patient Instructions (Addendum)
Stop advair   symbicort 160 Take 2 puffs first thing in am and then another 2 puffs about 12 hours later - clal me if any problem getting it for free   Work on perfecting inhaler technique:  relax and gently blow all the way out then take a nice smooth deep breath back in, triggering the inhaler at same time you start breathing in.  Hold for up to 5 seconds if you can.  Rinse and gargle with water when done    Only use your albuterol (proair) as a rescue medication to be used if you can't catch your breath by resting or doing a relaxed purse lip breathing pattern.  - The less you use it, the better it will work when you need it. - Ok to use up to 2 puffs  every 4 hours if you must but call for immediate appointment if use goes up over your usual need - Don't leave home without it !!  (think of it like the spare tire for your car)   Please schedule a follow up office visit in 6 weeks, call sooner if needed with pfts on return

## 2014-11-10 NOTE — Progress Notes (Signed)
Subjective:    Patient ID: Frank Rosales, male    DOB: Jul 12, 1955    MRN: [id number]    Brief patient profile:  29 yowm active smoker still able to do flat fine but steps x 3 flights sob with GOLD III copd 2013 prev eval by Dr Joya Gaskins  s/p admit  Admit date: 10/18/2014 Discharge date: 10/21/2014  Recommendations for Outpatient Follow-up:  1. Pt will need to follow up with PCP in 2 weeks post discharge 2. Please obtain BMP in one weeki  Discharge Diagnoses:  Acute COPD exacerbation Improving slowly. Continue treatment with steroids, antibiotics, nebulizer treatments.  -10/20/2014--clinically stable on room air -10/20/14--De-escalate to oral steroids -home with prednisone 50mg  daily and decrease by 10mg  daily -Patient has significant occupational exposures that may contribute to chronic lung disease. -I have advised the patient to follow-up with his pulmonologist after he is clinically stable in the outpatient setting. -Ambulatory pulse oximetry did not reveal any desaturation  Left lower lobe pneumonia/community-acquired Continue antibiotics as mentioned above. WBC was normal. -afebrile and hemodynamically stable -home with 4 more days of levofloxaxin 500mg  to complete 7 days of therapy  Syncope Likely vasovagal secondary to coughing spells. Monitor on telemetry. EKG shows sinus rhythm with nonspecific T-wave changes. Nothing acutely ischemic. Patient denies any chest pains. -Telemetry without any concerning dysrhythmia -10/20/2014 echocardiogram shows EF 60%, no WMA, normal RV  Hyponatremia Gently hydrate. Repeat labs in the morning. -Improving with IV fluids -Na 136 on day of d/c  History of alcohol abuse Last consumption of alcohol was on Sunday 6/5. He is on the alcohol withdrawal protocol. Thiamine. Continue to monitor. -No signs of withdrawal   Tobacco abuse On nicotine patch. -Continues to smoke -Nearly 80-pack-year history -Tobacco cessation discussed  History of  essential hypertension Continue his home medications. Monitor blood pressures closely  History of anxiety and depression Stable -continue xanax and lexapro   11/10/2014  Post hosp f/u/transition of care/ extended ov/establish  Frank Rosales  / advair maint rx / still actively smoking  Chief Complaint  Patient presents with  . Follow-up    Pt states had PNA recently and had to be admitted to hospital from 10/18/14 until 10/21/14. He feels that he is improving and would like to return to work with no restrictions.  He has occ cough with minimal green. He states he only gets SOB walking up 2 flights up stairs.     Ok flat at nl pace, some cough on exp to dust /fumes on advair but o noct events/ using saba at least twice daily   No obvious day to day or daytime variabilty or assoc  cp or chest tightness, subjective wheeze overt sinus or hb symptoms. No unusual exp hx or h/o childhood pna/ asthma or knowledge of premature birth.  Sleeping ok without nocturnal  or early am exacerbation  of respiratory  c/o's or need for noct saba. Also denies any obvious fluctuation of symptoms with weather or environmental changes or other aggravating or alleviating factors except as outlined above   Current Medications, Allergies, Complete Past Medical History, Past Surgical History, Family History, and Social History were reviewed in [employer] record.  ROS  The following are not active complaints unless bolded sore throat, dysphagia, dental problems, itching, sneezing,  nasal congestion or excess/ purulent secretions, ear ache,   fever, chills, sweats, unintended wt loss, pleuritic or exertional cp, hemoptysis,  orthopnea pnd or leg swelling, presyncope, palpitations, abdominal pain, anorexia, nausea, vomiting, diarrhea  or change in bowel or urinary habits, change in stools or urine, dysuria,hematuria,  rash, arthralgias, visual complaints, headache, numbness weakness or ataxia or problems with walking  or coordination,  change in mood/affect or memory.              Objective:   Physical Exam  amb hoarse wm with harsh upper airway cough   Wt Readings from Last 3 Encounters:  11/10/14 174 lb (78.926 kg)  11/08/14 176 lb 6.4 oz (80.015 kg)  10/27/14 175 lb (79.379 kg)    Vital signs reviewed   Gen: Pleasant, well-nourished, in no distress,  normal affect  ENT: No lesions,  mouth clear,  oropharynx clear, no postnasal drip  Neck: No JVD, no TMG, no carotid bruits  Lungs: No use of accessory muscles, no dullness to percussion, distant BS, insp and exp rhonchi and pseudowheeze  Cardiovascular: RRR, heart sounds normal, no murmur or gallops, no peripheral edema  Abdomen: soft and NT, no HSM,  BS normal  Musculoskeletal: No deformities, no cyanosis or clubbing  Neuro: alert, non focal  Skin: Warm, no lesions or rashes    I personally reviewed images and agree with radiology impression as follows:  CXR:  10/27/14 No focal infiltrate noted on today's examination. Left base pleural parenchymal thickening noted consistent with scarring.     Assessment & Plan:

## 2014-11-11 ENCOUNTER — Encounter: Payer: Self-pay | Admitting: Internal Medicine

## 2014-11-11 NOTE — Assessment & Plan Note (Signed)
Spirometry 03/2011 FeV1 49%    Spirometry  02/28/2012  FEV1 45%  He has at least moderately severe dz with active AB component and still smoking   .DDX of  difficult airways management all start with A and  include Adherence, Ace Inhibitors, Acid Reflux, Active Sinus Disease, Alpha 1 Antitripsin deficiency, Anxiety masquerading as Airways dz,  ABPA,  allergy(esp in young), Aspiration (esp in elderly), Adverse effects of meds,  Active smokers, A bunch of PE's (a small clot burden can't cause this syndrome unless there is already severe underlying pulm or vascular dz with poor reserve) plus two Bs  = Bronchiectasis and Beta blocker use..and one C= CHF  Adherence is always the initial "prime suspect" and is a multilayered concern that requires a "trust but verify" approach in every patient - starting with knowing how to use medications, especially inhalers, correctly, keeping up with refills and understanding the fundamental difference between maintenance and prns vs those medications only taken for a very short course and then stopped and not refilled.  The proper method of use, as well as anticipated side effects, of a metered-dose inhaler are discussed and demonstrated to the patient. Improved effectiveness after extensive coaching during this visit to a level of approximately  75% > try symbicort   ? Acid (or non-acid) GERD > always difficult to exclude as up to 75% of pts in some series report no assoc GI/ Heartburn symptoms> rec max   and diet restrictions/ reviewed and instructions given in writing.   Active smoking > see sep a/p  Adverse effects of dpi, esp advair > change to symbicort 160 2bid   I had an extended discussion with the patient reviewing all relevant studies completed to date and  lasting  25 minutes    Each maintenance medication was reviewed in detail including most importantly the difference between maintenance and prns and under what circumstances the prns are to be triggered  using an action plan format that is not reflected in the computer generated alphabetically organized AVS.    Please see instructions for details which were reviewed in writing and the patient given a copy highlighting the part that I personally wrote and discussed at today's ov.   On short term disability until returns on symb for pfts in 6 weeks

## 2014-11-11 NOTE — Assessment & Plan Note (Signed)
>   3 min discussion  I emphasized that although we never turn away smokers from the pulmonary clinic, we do ask that they understand that the recommendations that we make  won't work nearly as well in the presence of continued cigarette exposure.  In fact, we may very well  reach a point where we can't promise to help the patient if he/she can't quit smoking. (We can and will promise to try to help, we just can't promise what we recommend will really work)   Says only smoking 5 now, encouraged to fully commit to quit, defer rx to primary care noting already on psychotropic meds

## 2014-11-23 ENCOUNTER — Telehealth: Payer: Self-pay | Admitting: *Deleted

## 2014-11-23 NOTE — Telephone Encounter (Signed)
Received PA request for Symbicort Key: [id number] Submitted via [email or web address]

## 2014-11-25 NOTE — Telephone Encounter (Signed)
Symbicort has been approved Formulary exception approved for 12 months per [place] appeal 11/25/14-07/14-17. Pharmacy/Pt notified.

## 2014-12-14 ENCOUNTER — Ambulatory Visit (INDEPENDENT_AMBULATORY_CARE_PROVIDER_SITE_OTHER): Payer: 59

## 2014-12-14 ENCOUNTER — Encounter: Payer: Self-pay | Admitting: Family Medicine

## 2014-12-14 ENCOUNTER — Other Ambulatory Visit: Payer: Self-pay | Admitting: Nurse Practitioner

## 2014-12-14 ENCOUNTER — Ambulatory Visit (INDEPENDENT_AMBULATORY_CARE_PROVIDER_SITE_OTHER): Payer: 59 | Admitting: Family Medicine

## 2014-12-14 VITALS — BP 111/76 | HR 108 | Temp 97.8°F | Ht 70.0 in | Wt 178.6 lb

## 2014-12-14 DIAGNOSIS — E039 Hypothyroidism, unspecified: Secondary | ICD-10-CM | POA: Diagnosis not present

## 2014-12-14 DIAGNOSIS — I10 Essential (primary) hypertension: Secondary | ICD-10-CM | POA: Diagnosis not present

## 2014-12-14 DIAGNOSIS — J441 Chronic obstructive pulmonary disease with (acute) exacerbation: Secondary | ICD-10-CM | POA: Diagnosis not present

## 2014-12-14 DIAGNOSIS — M4806 Spinal stenosis, lumbar region: Secondary | ICD-10-CM

## 2014-12-14 DIAGNOSIS — M48061 Spinal stenosis, lumbar region without neurogenic claudication: Secondary | ICD-10-CM

## 2014-12-14 DIAGNOSIS — M5440 Lumbago with sciatica, unspecified side: Secondary | ICD-10-CM | POA: Diagnosis not present

## 2014-12-14 DIAGNOSIS — R0602 Shortness of breath: Secondary | ICD-10-CM

## 2014-12-14 DIAGNOSIS — R55 Syncope and collapse: Secondary | ICD-10-CM | POA: Diagnosis not present

## 2014-12-14 MED ORDER — PREDNISONE 10 MG PO TABS: ORAL_TABLET | ORAL | Status: DC

## 2014-12-14 NOTE — Progress Notes (Signed)
Subjective:  Patient ID: Frank Rosales, male    DOB: 04-30-1956  Age: 59 y.o. MRN: [id number]  CC: Back Pain   HPI Frank Rosales presents for frequent cough. Shortness of breath. 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 This was followed by syncope. He was only out for moment that he fell back and landed on his bottom exacerbating his back pain and back pain is moderate it is located in the lumbar region. He does have a history of a compression fracture of Patient has a history of L1 compression fracture due to fall. Has been off work using COPD meds per pulmonology. He is due for reevaluation by pulmonology on August 11. The syncope was not accompanied by any abnormal activity  History Frank Rosales has a past medical history of High blood pressure; Asthma; High cholesterol; Hilar adenopathy; COPD (chronic obstructive pulmonary disease); Anxiety; Colon polyps; Hypothyroidism; and Arthritis.   He has past surgical history that includes Knee arthroscopy (Right, 05/14/2010); Wisdom tooth extraction; epidural injections; Fixation kyphoplasty lumbar spine; and Back surgery.   His family history includes Emphysema in his father; Heart disease in his brother; Heart failure in his mother; Lung cancer in his father.He reports that he has been smoking Cigarettes.  He started smoking about 41 years ago. He has a 70 pack-year smoking history. He has never used smokeless tobacco. He reports that he drinks about 25.2 oz of alcohol per week. He reports that he does not use illicit drugs.  Outpatient Prescriptions Prior to Visit  Medication Sig Dispense Refill  . albuterol (PROAIR HFA) 108 (90 BASE) MCG/ACT inhaler Inhale 2 puffs into the lungs every 6 (six) hours as needed for wheezing or shortness of breath. 8 g 11  . ALPRAZolam (XANAX) 0.5 MG tablet Take 1 tablet (0.5 mg total) by mouth 3 (three) times daily as needed for anxiety. 90 tablet 3  . aspirin 81 MG  tablet Take 81 mg by mouth daily. Hold's medication prior to spinal injections    . budesonide-formoterol (SYMBICORT) 160-4.5 MCG/ACT inhaler Take 2 puffs first thing in am and then another 2 puffs about 12 hours later. 1 Inhaler 11  . cholecalciferol (VITAMIN D) 1000 UNITS tablet Take 1,000 Units by mouth daily.     . folic acid (FOLVITE) 1 MG tablet Take 1 tablet (1 mg total) by mouth daily. 90 tablet 3  . levothyroxine (SYNTHROID, LEVOTHROID) 25 MCG tablet TAKE 1 TABLET (25 MCG TOTAL) BY MOUTH DAILY. 90 tablet 2  . meloxicam (MOBIC) 15 MG tablet Take 1 tablet (15 mg total) by mouth daily. (Patient taking differently: Take 15 mg by mouth daily as needed. ) 30 tablet 5  . nicotine (NICODERM CQ) 7 mg/24hr patch Place 1 patch (7 mg total) onto the skin daily. 28 patch 0  . NIFEdipine (ADALAT CC) 90 MG 24 hr tablet TAKE 1 TABLET (90 MG TOTAL) BY MOUTH DAILY. 90 tablet 0  . OMEGA-3 KRILL OIL PO Take 1 capsule by mouth daily.    . valsartan-hydrochlorothiazide (DIOVAN-HCT) 320-25 MG per tablet TAKE 1 TABLET BY MOUTH DAILY. 90 tablet 0  . vitamin B-12 (CYANOCOBALAMIN) 250 MCG tablet Take 250 mcg by mouth daily.    Marland Kitchen escitalopram (LEXAPRO) 20 MG tablet Take 1 tablet (20 mg total) by mouth daily. 90 tablet 0   No facility-administered medications prior to visit.    ROS Review of Systems  Constitutional: Positive for fatigue.  Negative for fever, chills and diaphoresis.  HENT: Negative for congestion, rhinorrhea and sore throat.   Respiratory: Positive for cough and shortness of breath. Negative for wheezing.   Cardiovascular: Negative for chest pain.  Gastrointestinal: Negative for nausea, vomiting, abdominal pain, diarrhea, constipation and abdominal distention.  Genitourinary: Negative for dysuria and frequency.  Musculoskeletal: Positive for myalgias, back pain, joint swelling and arthralgias.  Skin: Negative for rash.  Neurological: Negative for headaches.    Objective:  BP 111/76 mmHg   Pulse 108  Temp(Src) 97.8 F (36.6 C) (Oral)  Ht 5\' 10"  (1.778 m)  Wt 178 lb 9.6 oz (81.012 kg)  BMI 25.63 kg/m2  BP Readings from Last 3 Encounters:  12/14/14 111/76  11/10/14 94/64  11/08/14 121/80    Wt Readings from Last 3 Encounters:  12/14/14 178 lb 9.6 oz (81.012 kg)  11/10/14 174 lb (78.926 kg)  11/08/14 176 lb 6.4 oz (80.015 kg)     Physical Exam  Constitutional: He is oriented to person, place, and time. He appears well-developed and well-nourished. No distress.  HENT:  Head: Normocephalic and atraumatic.  Right Ear: External ear normal.  Left Ear: External ear normal.  Nose: Nose normal.  Mouth/Throat: Oropharynx is clear and moist.  Eyes: Conjunctivae and EOM are normal. Pupils are equal, round, and reactive to light.  Neck: Normal range of motion. Neck supple. No thyromegaly present.  Cardiovascular: Normal rate, regular rhythm and normal heart sounds.   No murmur heard. Pulmonary/Chest: Effort normal. No respiratory distress. He has wheezes (Throughout all fields). He has no rales.  Abdominal: Soft. Bowel sounds are normal. He exhibits no distension. There is no tenderness.  Musculoskeletal: He exhibits edema and tenderness (Located at the paraspinous musculature of the lumbar region bilaterally.).  Lymphadenopathy:    He has no cervical adenopathy.  Neurological: He is alert and oriented to person, place, and time. He has normal reflexes.  Skin: Skin is warm and dry.  Psychiatric: He has a normal mood and affect. His behavior is normal. Judgment and thought content normal.    No results found for: HGBA1C  Lab Results  Component Value Date   WBC 11.2* 10/20/2014   HGB 14.7 10/20/2014   HCT 41.9 10/20/2014   PLT 182 10/20/2014   GLUCOSE 111* 10/27/2014   CHOL 220* 12/03/2013   TRIG 99 12/03/2013   HDL 75 12/03/2013   LDLCALC 125* 12/03/2013   ALT 33 10/18/2014   AST 32 10/18/2014   NA 137 10/27/2014   K 4.4 10/27/2014   CL 94* 10/27/2014    CREATININE 0.87 10/27/2014   BUN 10 10/27/2014   CO2 24 10/27/2014   TSH 2.230 12/03/2013   PSA 0.9 02/06/2013   INR 0.98 10/20/2010    Dg Chest 2 View  10/19/2014   CLINICAL DATA:  Cough and COPD  EXAM: CHEST  2 VIEW  COMPARISON:  10/18/2014  FINDINGS: Heart size is normal. No pleural effusion or edema. Lungs are hyperinflated. There is an opacity within the posterior left base which is increased from previous exam. There is also an anterior left base opacity. This is new from the previous exam compression deformity within the lower thoracic spine is unchanged.  IMPRESSION: 1. Progressive opacities in the left base compatible with pneumonia.   Electronically Signed   By: Kerby Moors M.D.   On: 10/19/2014 07:49   Dg Chest 2 View  10/18/2014   CLINICAL DATA:  Shortness of breath.  Cough and congestion .  EXAM:  CHEST  2 VIEW  COMPARISON:  07/12/2014, 07/01/2014, 08/20/2013.  FINDINGS: Mediastinum and hilar structures are normal. Mild left base infiltrate cannot be excluded. No pneumothorax. Mild bilateral pleural thickening noted consistent scarring. Similar findings noted on prior exams. Heart size normal. Degenerative changes thoracic spine.  IMPRESSION: Mild left base infiltrate cannot be excluded.   Electronically Signed   By: Marcello Moores  Register   On: 10/18/2014 12:02    Assessment & Plan:   Raziel was seen today for back pain.  Diagnoses and all orders for this visit:  Low back pain with sciatica, sciatica laterality unspecified, unspecified back pain laterality Orders: -     DG Lumbar Spine 2-3 Views -     TSH -     T4, Free  Spinal stenosis of lumbar region Orders: -     TSH -     T4, Free  SOB (shortness of breath) Orders: -     TSH -     T4, Free  COPD exacerbation Orders: -     TSH -     T4, Free  Essential hypertension Orders: -     TSH -     T4, Free  Syncope and collapse Orders: -     Cancel: Thyroid Panel With TSH -     TSH -     T4, Free  Other  orders -     predniSONE (DELTASONE) 10 MG tablet; Take 5 daily for 3 days followed by 4,3,2 and 1 for 3 days each.   I have discontinued Mr. Gude escitalopram. I am also having him start on predniSONE. Additionally, I am having him maintain his aspirin, OMEGA-3 KRILL OIL PO, cholecalciferol, vitamin W-10, folic acid, meloxicam, levothyroxine, albuterol, valsartan-hydrochlorothiazide, ALPRAZolam, NIFEdipine, nicotine, and budesonide-formoterol.  Meds ordered this encounter  Medications  . predniSONE (DELTASONE) 10 MG tablet    Sig: Take 5 daily for 3 days followed by 4,3,2 and 1 for 3 days each.    Dispense:  45 tablet    Refill:  0   Handout given for low back stretching exercises  X-ray: Lumbar spine series showing L1 compression fracture. Degenerative disc disease and spinal stenosis of the lumbar region. No acute changes.  Follow-up: Return in about 3 months (around 03/16/2015).  Claretta Fraise, M.D.

## 2014-12-14 NOTE — Patient Instructions (Signed)

## 2014-12-15 ENCOUNTER — Telehealth: Payer: Self-pay | Admitting: Internal Medicine

## 2014-12-15 ENCOUNTER — Other Ambulatory Visit: Payer: Self-pay | Admitting: Family Medicine

## 2014-12-15 LAB — T4, FREE: Free T4: 1.3 ng/dL (ref 0.82–1.77)

## 2014-12-15 LAB — TSH: TSH: 5.08 u[IU]/mL — ABNORMAL HIGH (ref 0.450–4.500)

## 2014-12-15 MED ORDER — LEVOTHYROXINE SODIUM 50 MCG PO TABS: 50.0000 ug | ORAL_TABLET | Freq: Every day | ORAL | Status: DC

## 2014-12-15 NOTE — Addendum Note (Signed)
Addended by: Jamelle Haring on: 12/15/2014 08:49 AM   Modules accepted: Orders

## 2014-12-15 NOTE — Progress Notes (Signed)
Pt aware of change in thyroid dosage

## 2014-12-15 NOTE — Telephone Encounter (Signed)
Pt informed of response. States he will see how he is feeling on Tuesday and if not feeling well he will call back and reschedule the PFT and OV. Nothing further needed.

## 2014-12-15 NOTE — Telephone Encounter (Signed)
Pt states he would like to know if he should reschedule his PFT and OV he has on 12/23/14 due to he fell and hurt his back and he will be on Prednisone until the 16th of August. He is concerned the Prednisone may affect the test. Pt states when we return call that we can leave a message if he doesn't answer.  MW please advise.

## 2014-12-15 NOTE — Telephone Encounter (Signed)
I strongly rec he keep the appt - the whole idea is to see his lung at his best and prednisone will probably do this though if his back is bothering him a lot then i would certainly understand if he wants to put off another 2-4 weeks fine with me

## 2014-12-20 ENCOUNTER — Telehealth: Payer: Self-pay | Admitting: *Deleted

## 2014-12-20 DIAGNOSIS — M5440 Lumbago with sciatica, unspecified side: Secondary | ICD-10-CM

## 2014-12-20 NOTE — Telephone Encounter (Signed)
Pt having continued back pain Prednisone did not help Please advise

## 2014-12-21 MED ORDER — TRAMADOL HCL 50 MG PO TABS: 50.0000 mg | ORAL_TABLET | ORAL | Status: DC | PRN

## 2014-12-21 NOTE — Telephone Encounter (Signed)
As we discussed... MRI LS Spine, PT, Ortho, Tramadol 50 mg 1-2 qid prn pain # 60 Thanks, WS

## 2014-12-21 NOTE — Telephone Encounter (Signed)
Pt notified of Dr Livia Snellen recommendation Verbalizes understanding

## 2014-12-23 ENCOUNTER — Ambulatory Visit: Payer: 59 | Admitting: Internal Medicine

## 2014-12-27 ENCOUNTER — Other Ambulatory Visit: Payer: Self-pay | Admitting: Family Medicine

## 2014-12-30 ENCOUNTER — Ambulatory Visit (HOSPITAL_COMMUNITY): Payer: 59

## 2015-01-10 ENCOUNTER — Ambulatory Visit (HOSPITAL_COMMUNITY): Payer: 59

## 2015-01-18 ENCOUNTER — Other Ambulatory Visit: Payer: Self-pay | Admitting: Neurosurgery

## 2015-01-18 DIAGNOSIS — S22080D Wedge compression fracture of T11-T12 vertebra, subsequent encounter for fracture with routine healing: Secondary | ICD-10-CM

## 2015-01-24 ENCOUNTER — Ambulatory Visit (INDEPENDENT_AMBULATORY_CARE_PROVIDER_SITE_OTHER): Payer: 59 | Admitting: Internal Medicine

## 2015-01-24 ENCOUNTER — Encounter: Payer: Self-pay | Admitting: Internal Medicine

## 2015-01-24 VITALS — BP 130/82 | HR 82 | Ht 69.5 in | Wt 180.0 lb

## 2015-01-24 DIAGNOSIS — R55 Syncope and collapse: Secondary | ICD-10-CM | POA: Diagnosis not present

## 2015-01-24 DIAGNOSIS — J449 Chronic obstructive pulmonary disease, unspecified: Secondary | ICD-10-CM

## 2015-01-24 DIAGNOSIS — F17210 Nicotine dependence, cigarettes, uncomplicated: Secondary | ICD-10-CM

## 2015-01-24 DIAGNOSIS — Z720 Tobacco use: Secondary | ICD-10-CM

## 2015-01-24 LAB — PULMONARY FUNCTION TEST
DL/VA % pred: 99 %
DL/VA: 4.56 ml/min/mmHg/L
DLCO UNC: 28.14 ml/min/mmHg
DLCO unc % pred: 88 %
FEF 25-75 Post: 0.72 L/sec
FEF 25-75 Pre: 0.52 L/sec
FEF2575-%Change-Post: 39 %
FEF2575-%PRED-POST: 23 %
FEF2575-%Pred-Pre: 16 %
FEV1-%CHANGE-POST: 22 %
FEV1-%PRED-POST: 43 %
FEV1-%PRED-PRE: 35 %
FEV1-POST: 1.59 L
FEV1-Pre: 1.29 L
FEV1FVC-%Change-Post: 17 %
FEV1FVC-%Pred-Pre: 60 %
FEV6-%Change-Post: 4 %
FEV6-%PRED-POST: 62 %
FEV6-%PRED-PRE: 59 %
FEV6-POST: 2.83 L
FEV6-PRE: 2.71 L
FEV6FVC-%CHANGE-POST: 0 %
FEV6FVC-%PRED-POST: 100 %
FEV6FVC-%PRED-PRE: 100 %
FVC-%Change-Post: 4 %
FVC-%PRED-POST: 61 %
FVC-%Pred-Pre: 58 %
FVC-Post: 2.93 L
FVC-Pre: 2.81 L
POST FEV6/FVC RATIO: 97 %
PRE FEV6/FVC RATIO: 96 %
Post FEV1/FVC ratio: 54 %
Pre FEV1/FVC ratio: 46 %
RV % PRED: 155 %
RV: 3.33 L
TLC % pred: 99 %
TLC: 6.83 L

## 2015-01-24 MED ORDER — PANTOPRAZOLE SODIUM 40 MG PO TBEC
40.0000 mg | DELAYED_RELEASE_TABLET | Freq: Every day | ORAL | Status: DC
Start: 2015-01-24 — End: 2015-06-02

## 2015-01-24 MED ORDER — FAMOTIDINE 20 MG PO TABS
ORAL_TABLET | ORAL | Status: DC
Start: 2015-01-24 — End: 2015-06-02

## 2015-01-24 MED ORDER — TRAMADOL HCL 50 MG PO TABS: 50.0000 mg | ORAL_TABLET | ORAL | Status: DC | PRN

## 2015-01-24 NOTE — Progress Notes (Signed)
PFT done today. 

## 2015-01-24 NOTE — Patient Instructions (Addendum)
The key is to stop smoking completely before smoking completely stops you!   I do not think you should work in a hot/dusty  Environment especially  with fume exposure   I recommend you seek permanent disability at this point if you can't work in a clean/ cool environment   Work on inhaler technique:  relax and gently blow all the way out then take a nice smooth deep breath back in, triggering the inhaler at same time you start breathing in.  Hold for up to 5 seconds if you can. Blow out thru nose. Rinse and gargle with water when done     For cough> mucinex DM 1200 mg per day supplement with ultram (tramadol) up to 2 every 4 hours until no longer coughing   Pantoprazole (protonix) 40 mg   Take  30-60 min before first meal of the day and Pepcid (famotidine)  20 mg one @  bedtime until return to office - this is the best way to tell whether stomach acid is contributing to your problem.    GERD (REFLUX)  is an extremely common cause of respiratory symptoms just like yours , many times with no obvious heartburn at all.    It can be treated with medication, but also with lifestyle changes including elevation of the head of your bed (ideally with 6 inch  bed blocks),  Smoking cessation, avoidance of late meals, excessive alcohol, and avoid fatty foods, chocolate, peppermint, colas, red wine, and acidic juices such as orange juice.  NO MINT OR MENTHOL PRODUCTS SO NO COUGH DROPS  USE SUGARLESS CANDY INSTEAD (Jolley ranchers or Stover's or Life Savers) or even ice chips will also do - the key is to swallow to prevent all throat clearing. NO OIL BASED VITAMINS - use powdered substitutes - stop fish oil and eat more fish     Please schedule a follow up office visit in 4 weeks, sooner if needed  Late add:  Advised cleared for surgery / copy to [facility]

## 2015-01-24 NOTE — Progress Notes (Signed)
Subjective:    Patient ID: Frank Rosales, male    DOB: 1955/12/24    MRN: [id number]    Brief patient profile:  41 yowm active smoker still able to do flat fine but steps x 3 flights sob with GOLD III copd 2013 prev eval by Dr Joya Gaskins  s/p admit  Admit date: 10/18/2014 Discharge date: 10/21/2014  Recommendations for Outpatient Follow-up:  1. Pt will need to follow up with PCP in 2 weeks post discharge 2. Please obtain BMP in one weeki  Discharge Diagnoses:  Acute COPD exacerbation Improving slowly. Continue treatment with steroids, antibiotics, nebulizer treatments.  -10/20/2014--clinically stable on room air -10/20/14--De-escalate to oral steroids -home with prednisone 50mg  daily and decrease by 10mg  daily -Patient has significant occupational exposures that may contribute to chronic lung disease. -I have advised the patient to follow-up with his pulmonologist after he is clinically stable in the outpatient setting. -Ambulatory pulse oximetry did not reveal any desaturation  Left lower lobe pneumonia/community-acquired Continue antibiotics as mentioned above. WBC was normal. -afebrile and hemodynamically stable -home with 4 more days of levofloxaxin 500mg  to complete 7 days of therapy  Syncope Likely vasovagal secondary to coughing spells. Monitor on telemetry. EKG shows sinus rhythm with nonspecific T-wave changes. Nothing acutely ischemic. Patient denies any chest pains. -Telemetry without any concerning dysrhythmia -10/20/2014 echocardiogram shows EF 60%, no WMA, normal RV  Hyponatremia Gently hydrate. Repeat labs in the morning. -Improving with IV fluids -Na 136 on day of d/c  History of alcohol abuse Last consumption of alcohol was on Sunday 6/5. He is on the alcohol withdrawal protocol. Thiamine. Continue to monitor. -No signs of withdrawal   Tobacco abuse On nicotine patch. -Continues to smoke -Nearly 80-pack-year history -Tobacco cessation discussed  History of  essential hypertension Continue his home medications. Monitor blood pressures closely  History of anxiety and depression Stable -continue xanax and lexapro   11/10/2014  Post hosp f/u/transition of care/ extended ov/establish  Frank Rosales  / advair maint rx / still actively smoking  Chief Complaint  Patient presents with  . Follow-up    Pt states had PNA recently and had to be admitted to hospital from 10/18/14 until 10/21/14. He feels that he is improving and would like to return to work with no restrictions.  He has occ cough with minimal green. He states he only gets SOB walking up 2 flights up stairs.    Ok flat at nl pace, some cough on exp to dust /fumes on advair but no noct events/ using saba at least twice daily  rec Stop advair  Symbicort 160 Take 2 puffs first thing in am and then another 2 puffs about 12 hours later - clal me if any problem getting it for free  Work on perfecting inhaler technique:    Only use your albuterol (proair) as needed     01/24/2015 f/u ov/Frank Rosales re: GOLD III copd with reversibility/ still smoking  Chief Complaint  Patient presents with  . Follow-up    PFT done today. Pt states that his breathing is unchanged. He rarely uses proair. He is needing pulmonary clearance for back surgery by Dr Rita Ohara.   Cough so hard passed out and hurt back - much worse cough in hot smoky dusty conditions and much worse at work were all of the above apply plus fume exposure.    No obvious day to day or daytime variabilty or assoc  cp or chest tightness, subjective wheeze overt sinus or hb symptoms. No  unusual exp hx or h/o childhood pna/ asthma or knowledge of premature birth.  Sleeping ok without nocturnal  or early am exacerbation  of respiratory  c/o's or need for noct saba. Also denies any obvious fluctuation of symptoms with weather or environmental changes or other aggravating or alleviating factors except as outlined above   Current Medications, Allergies, Complete Past  Medical History, Past Surgical History, Family History, and Social History were reviewed in [employer] record.  ROS  The following are not active complaints unless bolded sore throat, dysphagia, dental problems, itching, sneezing,  nasal congestion or excess/ purulent secretions, ear ache,   fever, chills, sweats, unintended wt loss, pleuritic or exertional cp, hemoptysis,  orthopnea pnd or leg swelling, presyncope, palpitations, abdominal pain, anorexia, nausea, vomiting, diarrhea  or change in bowel or urinary habits, change in stools or urine, dysuria,hematuria,  rash, arthralgias, visual complaints, headache, numbness weakness or ataxia or problems with walking or coordination,  change in mood/affect or memory.              Objective:   Physical Exam  amb hoarse wm  / slt tremulous   01/24/2015        180  Wt Readings from Last 3 Encounters:  11/10/14 174 lb (78.926 kg)  11/08/14 176 lb 6.4 oz (80.015 kg)  10/27/14 175 lb (79.379 kg)    Vital signs reviewed   Gen: Pleasant, well-nourished, in no distress,  normal affect  ENT: No lesions,  mouth clear,  oropharynx clear, no postnasal drip  Neck: No JVD, no TMG, no carotid bruits  Lungs: No use of accessory muscles, no dullness to percussion, distant BS, insp and exp rhonchi bilaterally   Cardiovascular: RRR, heart sounds normal, no murmur or gallops, no peripheral edema  Abdomen: soft and NT, no HSM,  BS normal  Musculoskeletal: No deformities, no cyanosis or clubbing  Neuro: alert, non focal  Skin: Warm, no lesions or rashes    I personally reviewed images and agree with radiology impression as follows:  CXR:  10/27/14 No focal infiltrate noted on today's examination. Left base pleural parenchymal thickening noted consistent with scarring.     Assessment & Plan:

## 2015-01-25 NOTE — Assessment & Plan Note (Signed)

## 2015-01-25 NOTE — Assessment & Plan Note (Signed)
I'm almost positive that this is cough syncope and is potentially life-threatening. Since cough is brought on by exposure to work conditions I strongly advised him to avoid these for now and consider seeking full term disability  In the meantime I recommended instituting acyclical cough regimen which contains tramadol to eliminate all coughing and max gerd rx   See instructions for specific recommendations which were reviewed directly with the patient who was given a copy with highlighter outlining the key components.

## 2015-01-25 NOTE — Assessment & Plan Note (Signed)
Spirometry 03/2011 FeV1 49%    Spirometry  02/28/2012  FEV1 45%   - PFT's  01/24/2015  FEV1 1.59  (43 % ) ratio 54   p 22 % improvement from saba with DLCO  88 % corrects to 99 % for alv volume s symbicort  The bad news is that he has slipped to a Gold 3 stage COPD but the good news he has quite a bit of reversibility I believe his prognosis would improve  if we get him to quit smoking now.  The proper method of use, as well as anticipated side effects, of a metered-dose inhaler are discussed and demonstrated to the patient. Improved effectiveness after extensive coaching during this visit to a level of approximately  75% so needs to keep working on it  No specific cut off in terms of an FEV1 for back surgery so it's fine to proceed with this but I again strongly recommended smoking for 2 weeks preop.  I had an extended discussion with the patient reviewing all relevant studies completed to date and  lasting 15 to 20 minutes of a 25 minute visit    Each maintenance medication was reviewed in detail including most importantly the difference between maintenance and prns and under what circumstances the prns are to be triggered using an action plan format that is not reflected in the computer generated alphabetically organized AVS.    Please see instructions for details which were reviewed in writing and the patient given a copy highlighting the part that I personally wrote and discussed at today's ov.

## 2015-01-27 ENCOUNTER — Telehealth: Payer: Self-pay | Admitting: Family Medicine

## 2015-01-27 NOTE — Telephone Encounter (Signed)
Future orders for thyroid are in system. Pt setup to come in at 8am tomorrow

## 2015-01-28 ENCOUNTER — Other Ambulatory Visit (INDEPENDENT_AMBULATORY_CARE_PROVIDER_SITE_OTHER): Payer: 59

## 2015-01-28 DIAGNOSIS — E039 Hypothyroidism, unspecified: Secondary | ICD-10-CM | POA: Diagnosis not present

## 2015-01-28 NOTE — Progress Notes (Signed)
Lab only 

## 2015-01-29 LAB — TSH: TSH: 1.78 u[IU]/mL (ref 0.450–4.500)

## 2015-01-29 LAB — T4, FREE: Free T4: 1.54 ng/dL (ref 0.82–1.77)

## 2015-01-30 ENCOUNTER — Other Ambulatory Visit: Payer: Self-pay | Admitting: Family Medicine

## 2015-02-04 ENCOUNTER — Encounter: Payer: Self-pay | Admitting: Family Medicine

## 2015-02-04 ENCOUNTER — Ambulatory Visit (INDEPENDENT_AMBULATORY_CARE_PROVIDER_SITE_OTHER): Payer: 59 | Admitting: Family Medicine

## 2015-02-04 VITALS — BP 129/92 | HR 122 | Temp 98.1°F | Ht 69.5 in | Wt 184.0 lb

## 2015-02-04 DIAGNOSIS — J449 Chronic obstructive pulmonary disease, unspecified: Secondary | ICD-10-CM | POA: Diagnosis not present

## 2015-02-04 DIAGNOSIS — I10 Essential (primary) hypertension: Secondary | ICD-10-CM | POA: Diagnosis not present

## 2015-02-04 DIAGNOSIS — E039 Hypothyroidism, unspecified: Secondary | ICD-10-CM

## 2015-02-04 DIAGNOSIS — E7800 Pure hypercholesterolemia, unspecified: Secondary | ICD-10-CM

## 2015-02-04 DIAGNOSIS — F411 Generalized anxiety disorder: Secondary | ICD-10-CM | POA: Diagnosis not present

## 2015-02-04 DIAGNOSIS — R55 Syncope and collapse: Secondary | ICD-10-CM | POA: Diagnosis not present

## 2015-02-04 MED ORDER — LEVOTHYROXINE SODIUM 50 MCG PO TABS: 50.0000 ug | ORAL_TABLET | Freq: Every day | ORAL | Status: DC

## 2015-02-04 MED ORDER — VARENICLINE TARTRATE 0.5 MG X 11 & 1 MG X 42 PO MISC
ORAL | Status: DC
Start: 2015-02-04 — End: 2015-03-07

## 2015-02-04 MED ORDER — ALPRAZOLAM 0.5 MG PO TABS: 0.5000 mg | ORAL_TABLET | Freq: Three times a day (TID) | ORAL | Status: DC | PRN

## 2015-02-04 NOTE — Progress Notes (Signed)
Subjective:  Patient ID: Frank Rosales, male    DOB: December 20, 1955  Age: 59 y.o. MRN: [id number]  CC: Hypothyroidism; Gastrophageal Reflux; COPD; and Back Pain   HPI Frank Rosales presents forPatient presents for follow-up on  thyroid. He has a history of hypothyroidism for many years. Dose was changed recently. Pt. denies any change in  voice, loss of hair, heat or cold intolerance. Energy level has been adequate to good. He denies constipation and diarrhea. No myxedema. Medication is as noted below. Verified that pt is taking it daily on an empty stomach. Well tolerated.   Patient is statin phobic. Dr. Adriana Reams krill. Therefore not taking anything for cholesterol currently. follow-up of hypertension. Patient has no history of headache chest pain or shortness of breath or recent cough. Patient also denies symptoms of TIA such as numbness weakness lateralizing. Patient checks  blood pressure at home and has not had any elevated readings recently. Patient denies side effects from his medication. States taking it regularly.  Dr. Marlene Lard also wrote a note stating that the patient's cough induced syncope was life-threatening and that his work environment contributed. Patient is working on unemployment. He had shortness of breath with mild exertion. Using Symbicort now instead of Advair. It seems to be somewhat better relieving shortness of breath.   History Welcome has a past medical history of High blood pressure; Asthma; High cholesterol; Hilar adenopathy; COPD (chronic obstructive pulmonary disease); Anxiety; Colon polyps; Hypothyroidism; and Arthritis.   He has past surgical history that includes Knee arthroscopy (Right, 05/14/2010); Wisdom tooth extraction; epidural injections; Fixation kyphoplasty lumbar spine; and Back surgery.   His family history includes Emphysema in his father; Heart disease in his brother; Heart failure in his mother; Lung cancer in his father.He reports that he has been  smoking Cigarettes.  He started smoking about 41 years ago. He has a 70 pack-year smoking history. He has never used smokeless tobacco. He reports that he drinks about 25.2 oz of alcohol per week. He reports that he does not use illicit drugs.  Outpatient Prescriptions Prior to Visit  Medication Sig Dispense Refill  . albuterol (PROAIR HFA) 108 (90 BASE) MCG/ACT inhaler Inhale 2 puffs into the lungs every 6 (six) hours as needed for wheezing or shortness of breath. 8 g 11  . aspirin 81 MG tablet Take 81 mg by mouth daily. Hold's medication prior to spinal injections    . budesonide-formoterol (SYMBICORT) 160-4.5 MCG/ACT inhaler Take 2 puffs first thing in am and then another 2 puffs about 12 hours later. 1 Inhaler 11  . cholecalciferol (VITAMIN D) 1000 UNITS tablet Take 1,000 Units by mouth daily.     . famotidine (PEPCID) 20 MG tablet One at bedtime 30 tablet 2  . folic acid (FOLVITE) 1 MG tablet Take 1 tablet (1 mg total) by mouth daily. 90 tablet 3  . meloxicam (MOBIC) 15 MG tablet Take 1 tablet (15 mg total) by mouth daily. (Patient taking differently: Take 15 mg by mouth daily as needed. ) 30 tablet 5  . NIFEdipine (PROCARDIA XL/ADALAT-CC) 90 MG 24 hr tablet TAKE 1 TABLET (90 MG TOTAL) BY MOUTH DAILY. 90 tablet 0  . pantoprazole (PROTONIX) 40 MG tablet Take 1 tablet (40 mg total) by mouth daily. Take 30-60 min before first meal of the day 30 tablet 2  . valsartan-hydrochlorothiazide (DIOVAN-HCT) 320-25 MG per tablet TAKE 1 TABLET BY MOUTH DAILY. 90 tablet 1  . vitamin B-12 (CYANOCOBALAMIN) 250 MCG tablet Take 250  mcg by mouth daily.    Marland Kitchen ALPRAZolam (XANAX) 0.5 MG tablet Take 1 tablet (0.5 mg total) by mouth 3 (three) times daily as needed for anxiety. 90 tablet 3  . levothyroxine (SYNTHROID, LEVOTHROID) 50 MCG tablet Take 1 tablet (50 mcg total) by mouth daily before breakfast. Wait 1 hour after taking before eating or drinking anything but water. 30 tablet 2  . nicotine (NICODERM CQ) 7 mg/24hr  patch Place 1 patch (7 mg total) onto the skin daily. 28 patch 0  . traMADol (ULTRAM) 50 MG tablet Take 1 tablet (50 mg total) by mouth every 4 (four) hours as needed. 60 tablet 0   No facility-administered medications prior to visit.    ROS Review of Systems  Constitutional: Negative for fever, chills and diaphoresis.  HENT: Negative for congestion, rhinorrhea and sore throat.   Respiratory: Positive for cough and shortness of breath. Negative for wheezing.   Cardiovascular: Negative for chest pain.  Gastrointestinal: Negative for nausea, vomiting, abdominal pain, diarrhea, constipation and abdominal distention.  Genitourinary: Negative for dysuria and frequency.  Musculoskeletal: Positive for myalgias, back pain and arthralgias. Negative for joint swelling.  Skin: Negative for rash.  Neurological: Negative for headaches.    Objective:  BP 129/92 mmHg  Pulse 122  Temp(Src) 98.1 F (36.7 C) (Oral)  Ht 5' 9.5" (1.765 m)  Wt 184 lb (83.462 kg)  BMI 26.79 kg/m2  BP Readings from Last 3 Encounters:  02/04/15 129/92  01/24/15 130/82  12/14/14 111/76    Wt Readings from Last 3 Encounters:  02/04/15 184 lb (83.462 kg)  01/24/15 180 lb (81.647 kg)  12/14/14 178 lb 9.6 oz (81.012 kg)     Physical Exam  Constitutional: He is oriented to person, place, and time. He appears well-developed and well-nourished. No distress.  HENT:  Head: Normocephalic and atraumatic.  Right Ear: External ear normal.  Left Ear: External ear normal.  Nose: Nose normal.  Mouth/Throat: Oropharynx is clear and moist.  Eyes: Conjunctivae and EOM are normal. Pupils are equal, round, and reactive to light.  Neck: Normal range of motion. Neck supple. No thyromegaly present.  Cardiovascular: Normal rate, regular rhythm and normal heart sounds.   No murmur heard. Pulmonary/Chest: No respiratory distress. He has wheezes. He has no rales.  Abdominal: Soft. Bowel sounds are normal. He exhibits no distension.  There is no tenderness.  Musculoskeletal: Normal range of motion. He exhibits no edema.  Lymphadenopathy:    He has no cervical adenopathy.  Neurological: He is alert and oriented to person, place, and time. He has normal reflexes.  Skin: Skin is warm and dry.  Psychiatric: He has a normal mood and affect. His behavior is normal. Judgment and thought content normal.    No results found for: HGBA1C  Lab Results  Component Value Date   WBC 11.2* 10/20/2014   HGB 14.7 10/20/2014   HCT 41.9 10/20/2014   PLT 182 10/20/2014   GLUCOSE 111* 10/27/2014   CHOL 220* 12/03/2013   TRIG 99 12/03/2013   HDL 75 12/03/2013   LDLCALC 125* 12/03/2013   ALT 33 10/18/2014   AST 32 10/18/2014   NA 137 10/27/2014   K 4.4 10/27/2014   CL 94* 10/27/2014   CREATININE 0.87 10/27/2014   BUN 10 10/27/2014   CO2 24 10/27/2014   TSH 1.780 01/28/2015   PSA 0.9 02/06/2013   INR 0.98 10/20/2010    Dg Chest 2 View  10/19/2014   CLINICAL DATA:  Cough and COPD  EXAM: CHEST  2 VIEW  COMPARISON:  10/18/2014  FINDINGS: Heart size is normal. No pleural effusion or edema. Lungs are hyperinflated. There is an opacity within the posterior left base which is increased from previous exam. There is also an anterior left base opacity. This is new from the previous exam compression deformity within the lower thoracic spine is unchanged.  IMPRESSION: 1. Progressive opacities in the left base compatible with pneumonia.   Electronically Signed   By: Kerby Moors M.D.   On: 10/19/2014 07:49   Dg Chest 2 View  10/18/2014   CLINICAL DATA:  Shortness of breath.  Cough and congestion .  EXAM: CHEST  2 VIEW  COMPARISON:  07/12/2014, 07/01/2014, 08/20/2013.  FINDINGS: Mediastinum and hilar structures are normal. Mild left base infiltrate cannot be excluded. No pneumothorax. Mild bilateral pleural thickening noted consistent scarring. Similar findings noted on prior exams. Heart size normal. Degenerative changes thoracic spine.   IMPRESSION: Mild left base infiltrate cannot be excluded.   Electronically Signed   By: Marcello Moores  Register   On: 10/18/2014 12:02    Assessment & Plan:   Azeem was seen today for hypothyroidism, gastrophageal reflux, copd and back pain.  Diagnoses and all orders for this visit:  Syncope, unspecified syncope type  Hypothyroidism, unspecified hypothyroidism type  High cholesterol -     Lipid panel  Essential hypertension  COPD GOLD III/ still smoking   Generalized anxiety disorder -     ALPRAZolam (XANAX) 0.5 MG tablet; Take 1 tablet (0.5 mg total) by mouth 3 (three) times daily as needed for anxiety.  Other orders -     varenicline (CHANTIX STARTING MONTH PAK) 0.5 MG X 11 & 1 MG X 42 tablet; Take one 0.5 mg tablet by mouth once daily for 3 days, then increase to one 0.5 mg tablet twice daily for 4 days, then increase to one 1 mg tablet twice daily. -     levothyroxine (SYNTHROID, LEVOTHROID) 50 MCG tablet; Take 1 tablet (50 mcg total) by mouth daily before breakfast. Wait 1 hour after taking before eating or drinking anything but water.   I have discontinued Mr. Linnemann nicotine and traMADol. I am also having him start on varenicline. Additionally, I am having him maintain his aspirin, cholecalciferol, vitamin W-09, folic acid, meloxicam, albuterol, budesonide-formoterol, valsartan-hydrochlorothiazide, famotidine, pantoprazole, NIFEdipine, ALPRAZolam, and levothyroxine.  Meds ordered this encounter  Medications  . ALPRAZolam (XANAX) 0.5 MG tablet    Sig: Take 1 tablet (0.5 mg total) by mouth 3 (three) times daily as needed for anxiety.    Dispense:  90 tablet    Refill:  5  . varenicline (CHANTIX STARTING MONTH PAK) 0.5 MG X 11 & 1 MG X 42 tablet    Sig: Take one 0.5 mg tablet by mouth once daily for 3 days, then increase to one 0.5 mg tablet twice daily for 4 days, then increase to one 1 mg tablet twice daily.    Dispense:  53 tablet    Refill:  0  . levothyroxine (SYNTHROID,  LEVOTHROID) 50 MCG tablet    Sig: Take 1 tablet (50 mcg total) by mouth daily before breakfast. Wait 1 hour after taking before eating or drinking anything but water.    Dispense:  90 tablet    Refill:  3     Follow-up: Return in about 1 month (around 03/06/2015) for COPD, Pain, cholesterol.  Claretta Fraise, M.D.

## 2015-02-05 LAB — LIPID PANEL
CHOLESTEROL TOTAL: 225 mg/dL — AB (ref 100–199)
Chol/HDL Ratio: 2.3 ratio units (ref 0.0–5.0)
HDL: 97 mg/dL (ref >39)
LDL CALC: 114 mg/dL — AB (ref 0–99)
TRIGLYCERIDES: 72 mg/dL (ref 0–149)
VLDL Cholesterol Cal: 14 mg/dL (ref 5–40)

## 2015-02-08 ENCOUNTER — Telehealth: Payer: Self-pay | Admitting: Family Medicine

## 2015-02-08 NOTE — Telephone Encounter (Signed)
Records faxed.

## 2015-02-08 NOTE — Telephone Encounter (Signed)
Spoke with pt and got all info needed for  Records to be faxed  [facility] # [id number]  Balta (p) [phone] ext [phone]  [phone]

## 2015-02-10 ENCOUNTER — Ambulatory Visit: Payer: 59 | Admitting: Internal Medicine

## 2015-02-21 ENCOUNTER — Encounter: Payer: Self-pay | Admitting: Internal Medicine

## 2015-02-21 ENCOUNTER — Ambulatory Visit (INDEPENDENT_AMBULATORY_CARE_PROVIDER_SITE_OTHER): Payer: 59 | Admitting: Internal Medicine

## 2015-02-21 VITALS — BP 122/90 | HR 104 | Ht 70.0 in | Wt 180.0 lb

## 2015-02-21 DIAGNOSIS — Z23 Encounter for immunization: Secondary | ICD-10-CM | POA: Diagnosis not present

## 2015-02-21 DIAGNOSIS — R55 Syncope and collapse: Secondary | ICD-10-CM

## 2015-02-21 DIAGNOSIS — J449 Chronic obstructive pulmonary disease, unspecified: Secondary | ICD-10-CM

## 2015-02-21 NOTE — Progress Notes (Signed)
Subjective:    Patient ID: Frank Rosales, male    DOB: Jan 24, 1956    MRN: [id number]    Brief patient profile:  66 yowm quit smoking 02/12/15  still able to do flat fine but steps x 3 flights sob with GOLD III copd 2013 prev eval by Dr Joya Gaskins  s/p admit  Admit date: 10/18/2014 Discharge date: 10/21/2014   Discharge Diagnoses:  Acute COPD exacerbation Improving slowly. Continue treatment with steroids, antibiotics, nebulizer treatments.  -10/20/2014--clinically stable on room air -10/20/14--De-escalate to oral steroids -home with prednisone 50mg  daily and decrease by 10mg  daily -Patient has significant occupational exposures that may contribute to chronic lung disease. -I have advised the patient to follow-up with his pulmonologist after he is clinically stable in the outpatient setting. -Ambulatory pulse oximetry did not reveal any desaturation  Left lower lobe pneumonia/community-acquired Continue antibiotics as mentioned above. WBC was normal. -afebrile and hemodynamically stable -home with 4 more days of levofloxaxin 500mg  to complete 7 days of therapy  Syncope Likely vasovagal secondary to coughing spells. Monitor on telemetry. EKG shows sinus rhythm with nonspecific T-wave changes. Nothing acutely ischemic. Patient denies any chest pains. -Telemetry without any concerning dysrhythmia -10/20/2014 echocardiogram shows EF 60%, no WMA, normal RV   11/10/2014  Post hosp f/u/transition of care/ extended ov/establish  Wert  / advair maint rx / still actively smoking  Chief Complaint  Patient presents with  . Follow-up    Pt states had PNA recently and had to be admitted to hospital from 10/18/14 until 10/21/14. He feels that he is improving and would like to return to work with no restrictions.  He has occ cough with minimal green. He states he only gets SOB walking up 2 flights up stairs.    Ok flat at nl pace, some cough on exp to dust /fumes on advair but no noct events/ using saba at  least twice daily  rec Stop advair  Symbicort 160 Take 2 puffs first thing in am and then another 2 puffs about 12 hours later - clal me if any problem getting it for free  Work on perfecting inhaler technique:    Only use your albuterol (proair) as needed     01/24/2015 f/u ov/Wert re: GOLD III copd with reversibility/ still smoking  Chief Complaint  Patient presents with  . Follow-up    PFT done today. Pt states that his breathing is unchanged. He rarely uses proair. He is needing pulmonary clearance for back surgery by Dr Rita Ohara.   Cough so hard passed out and hurt back - much worse cough in hot smoky dusty conditions and much worse at work were all of the above apply plus fume exposure.  rec The key is to stop smoking completely before smoking completely stops you!  I do not think you should work in a hot/dusty  Environment especially  with fume exposure  I recommend you seek permanent disability at this point if you can't work in a clean/ cool environment  Work on inhaler technique:    For cough> mucinex DM 1200 mg per day supplement with ultram (tramadol) up to 2 every 4 hours until no longer coughing  Pantoprazole (protonix) 40 mg   Take  30-60 min before first meal of the day and Pepcid (famotidine)  20 mg one @  bedtime until return to office - this is the best way to tell whether stomach acid is contributing to your problem.   GERD diet  Please schedule a follow  up office visit in 4 weeks, sooner if needed  Late add:  Advised cleared for surgery / copy to [facility] > deferred by [facility]    02/21/2015  f/u ov/Wert re: GOLD III / quit smoking around 02/12/15   Chief Complaint  Patient presents with  . Follow-up    Pt states his breathing seems to be improving and he is not coughing as much. He has only used albuterol x 1 since his last visit.    A little coughing each am / not using symbicort immediately in am as rec / no more presyncope/ no back pain with coughing   Not  limited by breathing from desired activities  But very sedentary due to back / leg pain     No obvious day to day or daytime variabilty or assoc  cp or chest tightness, subjective wheeze overt sinus or hb symptoms. No unusual exp hx or h/o childhood pna/ asthma or knowledge of premature birth.  Sleeping ok without nocturnal  or early am exacerbation  of respiratory  c/o's or need for noct saba. Also denies any obvious fluctuation of symptoms with weather or environmental changes or other aggravating or alleviating factors except as outlined above   Current Medications, Allergies, Complete Past Medical History, Past Surgical History, Family History, and Social History were reviewed in [employer] record.  ROS  The following are not active complaints unless bolded sore throat, dysphagia, dental problems, itching, sneezing,  nasal congestion or excess/ purulent secretions, ear ache,   fever, chills, sweats, unintended wt loss, pleuritic or exertional cp, hemoptysis,  orthopnea pnd or leg swelling, presyncope, palpitations, abdominal pain, anorexia, nausea, vomiting, diarrhea  or change in bowel or urinary habits, change in stools or urine, dysuria,hematuria,  rash, arthralgias, visual complaints, headache, numbness down R leg with no change back pain  weakness or ataxia or problems with walking or coordination,  change in mood/affect or memory.              Objective:   Physical Exam  amb hoarse wm  / slt tremulous   01/24/2015        180 > 02/21/2015  180   Wt Readings from Last 3 Encounters:  11/10/14 174 lb (78.926 kg)  11/08/14 176 lb 6.4 oz (80.015 kg)  10/27/14 175 lb (79.379 kg)    Vital signs reviewed   Gen: Pleasant, well-nourished, in no distress,  normal affect  ENT: No lesions,  mouth clear,  oropharynx clear, no postnasal drip  Neck: No JVD, no TMG, no carotid bruits  Lungs: No use of accessory muscles, no dullness to percussion, distant BS,  Min  insp and exp rhonchi mostly upper airway   Cardiovascular: RRR, heart sounds normal, no murmur or gallops, no peripheral edema  Abdomen: soft and NT, no HSM,  BS normal  Musculoskeletal: No deformities, no cyanosis or clubbing  Neuro: alert, non focal  Skin: Warm, no lesions or rashes    I personally reviewed images and agree with radiology impression as follows:  CXR:  10/27/14 No focal infiltrate noted on today's examination. Left base pleural parenchymal thickening noted consistent with scarring.     Assessment & Plan:

## 2015-02-21 NOTE — Assessment & Plan Note (Addendum)
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  Therefore stopping smoking and maintaining abstinence is the most important aspect of care, not choice of inhalers or for that matter, doctors.    I had an extended discussion with the patient reviewing all relevant studies completed to date and  lasting 15 to 20 minutes of a 25 minute visit    Each maintenance medication was reviewed in detail including most importantly the difference between maintenance and prns and under what circumstances the prns are to be triggered using an action plan format that is not reflected in the computer generated alphabetically organized AVS.    Please see instructions for details which were reviewed in writing and the patient given a copy highlighting the part that I personally wrote and discussed at today's ov.

## 2015-02-21 NOTE — Assessment & Plan Note (Signed)
Resolved once coughing stopped typical of cough syncope

## 2015-02-21 NOTE — Patient Instructions (Addendum)
No change in recommendations > the most important aspect of your care so far is to maintain off cigarettes at all costs  Prevnar 13/ flu given today   Please schedule a follow up visit in 3 months but call sooner if needed

## 2015-03-07 ENCOUNTER — Encounter: Payer: Self-pay | Admitting: Family Medicine

## 2015-03-07 ENCOUNTER — Ambulatory Visit (INDEPENDENT_AMBULATORY_CARE_PROVIDER_SITE_OTHER): Payer: 59 | Admitting: Family Medicine

## 2015-03-07 VITALS — BP 129/84 | HR 116 | Temp 97.2°F | Ht 70.0 in | Wt 181.0 lb

## 2015-03-07 DIAGNOSIS — F411 Generalized anxiety disorder: Secondary | ICD-10-CM

## 2015-03-07 DIAGNOSIS — I10 Essential (primary) hypertension: Secondary | ICD-10-CM | POA: Diagnosis not present

## 2015-03-07 DIAGNOSIS — F1010 Alcohol abuse, uncomplicated: Secondary | ICD-10-CM | POA: Diagnosis not present

## 2015-03-07 DIAGNOSIS — Z720 Tobacco use: Secondary | ICD-10-CM

## 2015-03-07 DIAGNOSIS — F17210 Nicotine dependence, cigarettes, uncomplicated: Secondary | ICD-10-CM

## 2015-03-07 DIAGNOSIS — J449 Chronic obstructive pulmonary disease, unspecified: Secondary | ICD-10-CM | POA: Diagnosis not present

## 2015-03-07 MED ORDER — DULOXETINE HCL 30 MG PO CPEP: 30.0000 mg | ORAL_CAPSULE | Freq: Every day | ORAL | Status: DC

## 2015-03-07 MED ORDER — MAGIC MOUTHWASH: 10.0000 mL | Freq: Four times a day (QID) | ORAL | Status: DC | PRN

## 2015-03-07 NOTE — Patient Instructions (Addendum)
Slowly decrease alacohol by one daily beer every week for 6 weeks. Then DC.  Try to decrease xanaxz to 1/2 tablet daily this month.  Alcohol Use Disorder Alcohol use disorder is a mental disorder. It is not a one-time incident of heavy drinking. Alcohol use disorder is the excessive and uncontrollable use of alcohol over time that leads to problems with functioning in one or more areas of daily living. People with this disorder risk harming themselves and others when they drink to excess. Alcohol use disorder also can cause other mental disorders, such as mood and anxiety disorders, and serious physical problems. People with alcohol use disorder often misuse other drugs.  Alcohol use disorder is common and widespread. Some people with this disorder drink alcohol to cope with or escape from negative life events. Others drink to relieve chronic pain or symptoms of mental illness. People with a family history of alcohol use disorder are at higher risk of losing control and using alcohol to excess.  Drinking too much alcohol can cause injury, accidents, and health problems. One drink can be too much when you are:  Working.  Pregnant or breastfeeding.  Taking medicines. Ask your doctor.  Driving or planning to drive. SYMPTOMS  Signs and symptoms of alcohol use disorder may include the following:   Consumption ofalcohol inlarger amounts or over a longer period of time than intended.  Multiple unsuccessful attempts to cutdown or control alcohol use.   A great deal of time spent obtaining alcohol, using alcohol, or recovering from the effects of alcohol (hangover).  A strong desire or urge to use alcohol (cravings).   Continued use of alcohol despite problems at work, school, or home because of alcohol use.   Continued use of alcohol despite problems in relationships because of alcohol use.  Continued use of alcohol in situations when it is physically hazardous, such as driving a  car.  Continued use of alcohol despite awareness of a physical or psychological problem that is likely related to alcohol use. Physical problems related to alcohol use can involve the brain, heart, liver, stomach, and intestines. Psychological problems related to alcohol use include intoxication, depression, anxiety, psychosis, delirium, and dementia.   The need for increased amounts of alcohol to achieve the same desired effect, or a decreased effect from the consumption of the same amount of alcohol (tolerance).  Withdrawal symptoms upon reducing or stopping alcohol use, or alcohol use to reduce or avoid withdrawal symptoms. Withdrawal symptoms include:  Racing heart.  Hand tremor.  Difficulty sleeping.  Nausea.  Vomiting.  Hallucinations.  Restlessness.  Seizures. DIAGNOSIS Alcohol use disorder is diagnosed through an assessment by your health care provider. Your health care provider may start by asking three or four questions to screen for excessive or problematic alcohol use. To confirm a diagnosis of alcohol use disorder, at least two symptoms must be present within a 55-month period. The severity of alcohol use disorder depends on the number of symptoms:  Mild--two or three.  Moderate--four or five.  Severe--six or more. Your health care provider may perform a physical exam or use results from lab tests to see if you have physical problems resulting from alcohol use. Your health care provider may refer you to a mental health professional for evaluation. TREATMENT  Some people with alcohol use disorder are able to reduce their alcohol use to low-risk levels. Some people with alcohol use disorder need to quit drinking alcohol. When necessary, mental health professionals with specialized training in substance  use treatment can help. Your health care provider can help you decide how severe your alcohol use disorder is and what type of treatment you need. The following forms of  treatment are available:   Detoxification. Detoxification involves the use of prescription medicines to prevent alcohol withdrawal symptoms in the first week after quitting. This is important for people with a history of symptoms of withdrawal and for heavy drinkers who are likely to have withdrawal symptoms. Alcohol withdrawal can be dangerous and, in severe cases, cause death. Detoxification is usually provided in a hospital or in-patient substance use treatment facility.  Counseling or talk therapy. Talk therapy is provided by substance use treatment counselors. It addresses the reasons people use alcohol and ways to keep them from drinking again. The goals of talk therapy are to help people with alcohol use disorder find healthy activities and ways to cope with life stress, to identify and avoid triggers for alcohol use, and to handle cravings, which can cause relapse.  Medicines.Different medicines can help treat alcohol use disorder through the following actions:  Decrease alcohol cravings.  Decrease the positive reward response felt from alcohol use.  Produce an uncomfortable physical reaction when alcohol is used (aversion therapy).  Support groups. Support groups are run by people who have quit drinking. They provide emotional support, advice, and guidance. These forms of treatment are often combined. Some people with alcohol use disorder benefit from intensive combination treatment provided by specialized substance use treatment centers. Both inpatient and outpatient treatment programs are available.   This information is not intended to replace advice given to you by your health care provider. Make sure you discuss any questions you have with your health care provider.   Document Released: 06/07/2004 Document Revised: 05/21/2014 Document Reviewed: 08/07/2012 Elsevier Interactive Patient Education [employer].

## 2015-03-07 NOTE — Progress Notes (Signed)
Subjective:  Patient ID: Frank Rosales, male    DOB: 05/30/1955  Age: 59 y.o. MRN: [id number]  CC: Back Pain and COPD   HPI SOUL HACKMAN presents for back pain, burning down right leg, but now having numbness and tingling in right leg. Moderately severe. Pain preventing exertion beyond walking over 200 feet. Seeing specialist next week. The plan is to monitor vertebral fracture for healing. Then planning surgery.   Breathing good. No cigarette for 4 weeks. Didn't need chantix. Drinking six beers a day. Getting to the point of needing to quit. Family and friends have expressed concern.  Mouth sore from symbicort. Breathing is good.  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   He has past surgical history that includes Knee arthroscopy (Right, 05/14/2010); Wisdom tooth extraction; epidural injections; Fixation kyphoplasty lumbar spine; and Back surgery.   His family history includes Emphysema in his father; Heart disease in his brother; Heart failure in his mother; Lung cancer in his father.He reports that he quit smoking about 4 weeks ago. His smoking use included Cigarettes. He started smoking about 41 years ago. He has a 70 pack-year smoking history. He has never used smokeless tobacco. He reports that he drinks about 25.2 oz of alcohol per week. He reports that he does not use illicit drugs.  Outpatient Prescriptions Prior to Visit  Medication Sig Dispense Refill  . albuterol (PROAIR HFA) 108 (90 BASE) MCG/ACT inhaler Inhale 2 puffs into the lungs every 6 (six) hours as needed for wheezing or shortness of breath. 8 g 11  . ALPRAZolam (XANAX) 0.5 MG tablet Take 1 tablet (0.5 mg total) by mouth 3 (three) times daily as needed for anxiety. 90 tablet 5  . aspirin 81 MG tablet Take 81 mg by mouth daily. Hold's  medication prior to spinal injections    . budesonide-formoterol (SYMBICORT) 160-4.5 MCG/ACT inhaler Take 2 puffs first thing in am and then another 2 puffs about 12 hours later. 1 Inhaler 11  . cholecalciferol (VITAMIN D) 1000 UNITS tablet Take 1,000 Units by mouth daily.     . famotidine (PEPCID) 20 MG tablet One at bedtime 30 tablet 2  . folic acid (FOLVITE) 1 MG tablet Take 1 tablet (1 mg total) by mouth daily. 90 tablet 3  . levothyroxine (SYNTHROID, LEVOTHROID) 50 MCG tablet Take 1 tablet (50 mcg total) by mouth daily before breakfast. Wait 1 hour after taking before eating or drinking anything but water. 90 tablet 3  . meloxicam (MOBIC) 15 MG tablet Take 1 tablet (15 mg total) by mouth daily. (Patient taking differently: Take 15 mg by mouth daily as needed. ) 30 tablet 5  . NIFEdipine (PROCARDIA XL/ADALAT-CC) 90 MG 24 hr tablet TAKE 1 TABLET (90 MG TOTAL) BY MOUTH DAILY. 90 tablet 0  . pantoprazole (PROTONIX) 40 MG tablet Take 1 tablet (40 mg total) by mouth daily. Take 30-60 min before first meal of the day 30 tablet 2  . valsartan-hydrochlorothiazide (DIOVAN-HCT) 320-25 MG per tablet TAKE 1 TABLET BY MOUTH DAILY. 90 tablet 1  . vitamin B-12 (CYANOCOBALAMIN) 250 MCG tablet Take 250 mcg by mouth daily.    . varenicline (CHANTIX STARTING MONTH PAK) 0.5 MG X 11 & 1 MG X 42 tablet Take one 0.5 mg tablet by mouth once daily for 3 days, then increase to one 0.5 mg tablet twice daily for  4 days, then increase to one 1 mg tablet twice daily. (Patient not taking: Reported on 03/07/2015) 53 tablet 0   No facility-administered medications prior to visit.    ROS Review of Systems  Constitutional: Negative for fever, chills and diaphoresis.  HENT: Negative for congestion, rhinorrhea and sore throat.   Respiratory: Negative for cough, shortness of breath and wheezing.   Cardiovascular: Negative for chest pain.  Gastrointestinal: Negative for nausea, vomiting, abdominal pain, diarrhea, constipation  and abdominal distention.  Musculoskeletal: Positive for back pain and arthralgias. Negative for joint swelling.  Skin: Negative for rash.  Neurological: Negative for headaches.    Objective:  BP 129/84 mmHg  Pulse 116  Temp(Src) 97.2 F (36.2 C) (Oral)  Ht 5\' 10"  (1.778 m)  Wt 181 lb (82.101 kg)  BMI 25.97 kg/m2  BP Readings from Last 3 Encounters:  03/07/15 129/84  02/21/15 122/90  02/04/15 129/92    Wt Readings from Last 3 Encounters:  03/07/15 181 lb (82.101 kg)  02/21/15 180 lb (81.647 kg)  02/04/15 184 lb (83.462 kg)     Physical Exam  Constitutional: He is oriented to person, place, and time. He appears well-developed and well-nourished. No distress.  HENT:  Head: Normocephalic and atraumatic.  Right Ear: External ear normal.  Left Ear: External ear normal.  Nose: Nose normal.  Mouth/Throat: Oropharynx is clear and moist.  Eyes: Conjunctivae and EOM are normal. Pupils are equal, round, and reactive to light.  Neck: Normal range of motion. Neck supple. No thyromegaly present.  Cardiovascular: Normal rate, regular rhythm and normal heart sounds.   No murmur heard. Pulmonary/Chest: Effort normal and breath sounds normal. No respiratory distress. He has no wheezes. He has no rales.  Abdominal: Soft. Bowel sounds are normal. He exhibits no distension. There is no tenderness.  Lymphadenopathy:    He has no cervical adenopathy.  Neurological: He is alert and oriented to person, place, and time. He has normal reflexes.  Skin: Skin is warm and dry.  Psychiatric: He has a normal mood and affect. His behavior is normal. Judgment and thought content normal.    No results found for: HGBA1C  Lab Results  Component Value Date   WBC 11.2* 10/20/2014   HGB 14.7 10/20/2014   HCT 41.9 10/20/2014   PLT 182 10/20/2014   GLUCOSE 111* 10/27/2014   CHOL 225* 02/04/2015   TRIG 72 02/04/2015   HDL 97 02/04/2015   LDLCALC 114* 02/04/2015   ALT 33 10/18/2014   AST 32  10/18/2014   NA 137 10/27/2014   K 4.4 10/27/2014   CL 94* 10/27/2014   CREATININE 0.87 10/27/2014   BUN 10 10/27/2014   CO2 24 10/27/2014   TSH 1.780 01/28/2015   PSA 0.9 02/06/2013   INR 0.98 10/20/2010    Dg Chest 2 View  10/19/2014  CLINICAL DATA:  Cough and COPD EXAM: CHEST  2 VIEW COMPARISON:  10/18/2014 FINDINGS: Heart size is normal. No pleural effusion or edema. Lungs are hyperinflated. There is an opacity within the posterior left base which is increased from previous exam. There is also an anterior left base opacity. This is new from the previous exam compression deformity within the lower thoracic spine is unchanged. IMPRESSION: 1. Progressive opacities in the left base compatible with pneumonia. Electronically Signed   By: Kerby Moors M.D.   On: 10/19/2014 07:49   Dg Chest 2 View  10/18/2014  CLINICAL DATA:  Shortness of breath.  Cough and congestion . EXAM: CHEST  2 VIEW COMPARISON:  07/12/2014, 07/01/2014, 08/20/2013. FINDINGS: Mediastinum and hilar structures are normal. Mild left base infiltrate cannot be excluded. No pneumothorax. Mild bilateral pleural thickening noted consistent scarring. Similar findings noted on prior exams. Heart size normal. Degenerative changes thoracic spine. IMPRESSION: Mild left base infiltrate cannot be excluded. Electronically Signed   By: Marcello Moores  Register   On: 10/18/2014 12:02    Assessment & Plan:   Aizik was seen today for back pain and copd.  Diagnoses and all orders for this visit:  COPD GOLD III/ still smoking   Cigarette smoker  Alcohol abuse  Essential hypertension  Generalized anxiety disorder  Other orders -     DULoxetine (CYMBALTA) 30 MG capsule; Take 1 capsule (30 mg total) by mouth daily. For one week then two daily. Take with a full stomach at suppertime -     magic mouthwash SOLN; Take 10 mLs by mouth 4 (four) times daily as needed for mouth pain (after using inhaler).   I have discontinued Mr. Stellmach  varenicline. I am also having him start on DULoxetine and magic mouthwash. Additionally, I am having him maintain his aspirin, cholecalciferol, vitamin X-83, folic acid, meloxicam, albuterol, budesonide-formoterol, valsartan-hydrochlorothiazide, famotidine, pantoprazole, NIFEdipine, ALPRAZolam, and levothyroxine.  Meds ordered this encounter  Medications  . DULoxetine (CYMBALTA) 30 MG capsule    Sig: Take 1 capsule (30 mg total) by mouth daily. For one week then two daily. Take with a full stomach at suppertime    Dispense:  60 capsule    Refill:  0  . magic mouthwash SOLN    Sig: Take 10 mLs by mouth 4 (four) times daily as needed for mouth pain (after using inhaler).    Dispense:  240 mL    Refill:  11   We discussed alcohol cessation methods in detail. Patient feels he can do that without intervention. He wants to give that a try for this month. He will taper off of the alcohol. He is going to slowly decrease alcohol by one daily beer every week for 6 weeks. Written alcohol use disorder handout given to the patient.  He's also going to try to decrease his Xanax to one half tablet daily.  Follow-up: Return in about 1 month (around 04/07/2015) for alcohol, anxiety, COPD.  Claretta Fraise, M.D.

## 2015-03-14 ENCOUNTER — Ambulatory Visit
Admission: RE | Admit: 2015-03-14 | Discharge: 2015-03-14 | Disposition: A | Discharge status: Home / Self Care | Payer: 59 | Source: Ambulatory Visit | Attending: Neurosurgery | Admitting: Neurosurgery

## 2015-03-14 DIAGNOSIS — S22080D Wedge compression fracture of T11-T12 vertebra, subsequent encounter for fracture with routine healing: Secondary | ICD-10-CM

## 2015-03-15 ENCOUNTER — Other Ambulatory Visit: Payer: Self-pay | Admitting: Internal Medicine

## 2015-03-15 MED ORDER — BUDESONIDE-FORMOTEROL FUMARATE 160-4.5 MCG/ACT IN AERO
INHALATION_SPRAY | RESPIRATORY_TRACT | Status: DC
Start: 2015-03-15 — End: 2015-07-06

## 2015-04-06 ENCOUNTER — Ambulatory Visit (INDEPENDENT_AMBULATORY_CARE_PROVIDER_SITE_OTHER): Payer: 59 | Admitting: Family Medicine

## 2015-04-06 ENCOUNTER — Encounter: Payer: Self-pay | Admitting: Family Medicine

## 2015-04-06 VITALS — BP 97/74 | HR 109 | Temp 97.4°F | Ht 70.0 in | Wt 190.8 lb

## 2015-04-06 DIAGNOSIS — M8580 Other specified disorders of bone density and structure, unspecified site: Secondary | ICD-10-CM

## 2015-04-06 DIAGNOSIS — J449 Chronic obstructive pulmonary disease, unspecified: Secondary | ICD-10-CM | POA: Diagnosis not present

## 2015-04-06 DIAGNOSIS — F17210 Nicotine dependence, cigarettes, uncomplicated: Secondary | ICD-10-CM

## 2015-04-06 DIAGNOSIS — M4806 Spinal stenosis, lumbar region: Secondary | ICD-10-CM | POA: Diagnosis not present

## 2015-04-06 DIAGNOSIS — F411 Generalized anxiety disorder: Secondary | ICD-10-CM

## 2015-04-06 DIAGNOSIS — E039 Hypothyroidism, unspecified: Secondary | ICD-10-CM | POA: Diagnosis not present

## 2015-04-06 DIAGNOSIS — I10 Essential (primary) hypertension: Secondary | ICD-10-CM

## 2015-04-06 DIAGNOSIS — F1010 Alcohol abuse, uncomplicated: Secondary | ICD-10-CM

## 2015-04-06 DIAGNOSIS — G4700 Insomnia, unspecified: Secondary | ICD-10-CM

## 2015-04-06 DIAGNOSIS — Z720 Tobacco use: Secondary | ICD-10-CM | POA: Diagnosis not present

## 2015-04-06 DIAGNOSIS — M48061 Spinal stenosis, lumbar region without neurogenic claudication: Secondary | ICD-10-CM

## 2015-04-06 DIAGNOSIS — R7301 Impaired fasting glucose: Secondary | ICD-10-CM | POA: Diagnosis not present

## 2015-04-06 MED ORDER — DULOXETINE HCL 30 MG PO CPEP: 90.0000 mg | ORAL_CAPSULE | Freq: Every day | ORAL | Status: DC

## 2015-04-06 MED ORDER — TRAZODONE HCL 150 MG PO TABS: 150.0000 mg | ORAL_TABLET | Freq: Every day | ORAL | Status: DC

## 2015-04-06 NOTE — Patient Instructions (Addendum)
Decrease xanax to 1/2 tab twice daily, maximum.  Use trazodone 1/2 to one at bedtime for sleep

## 2015-04-06 NOTE — Progress Notes (Signed)
Subjective:  Patient ID: Frank Rosales, male    DOB: 11/14/1955  Age: 59 y.o. MRN: [id number]  CC: Back Pain; COPD; and Insomnia   HPI Frank Rosales presents for back pain. Considering fusion and rods with Dr. Rita Ohara. Having injection on Dec.1.ESI to see if relief adequate. Patient is seeking guidance about this concerning his options. Additionally his pain has been 6-8/10. It is midline lower lumbar with sciatica.Some of his x-rays of his back etc. have indicated a tendency toward osteopenia. Because of that he had a bone density scan which confirmed that diagnosis. He would like treatment for that. The test is printed below and reviewed results with patient.  No progress on alcohol. Had "just" 4 beers yesterday. Usually more. He blames it on," A lot going on," worrying him. This worry is also contributing to difficulty sleeping. Lays awake for hours. Awakens some during the night.   No cigarettes smoked lately. He continues to have some dyspnea on exertion. However he is using the Symbicort and only occasionally having to use his rescue inhaler, pro-air.   follow-up of hypertension. Patient has no history of headache chest pain or shortness of breath or recent cough. Patient also denies symptoms of TIA such as numbness weakness lateralizing. Patient checks  blood pressure at home and has not had any elevated readings recently. Patient denies side effects from his medication. States taking it regularly.  Patient presents for follow-up on  thyroid. Pt. denies any change in  voice, loss of hair, heat or cold intolerance. Energy level has been adequateonly. He denies constipation and diarrhea. No myxedema. Medication is as noted below. Verified that pt is taking it daily on an empty stomach. Well tolerated.   History Frank Rosales has a past medical history of High blood pressure; Asthma; High cholesterol; Hilar adenopathy; COPD (chronic obstructive pulmonary disease) ([place]); Anxiety; Colon polyps;  Hypothyroidism; and Arthritis.   He has past surgical history that includes Knee arthroscopy (Right, 05/14/2010); Wisdom tooth extraction; epidural injections; Fixation kyphoplasty lumbar spine; and Back surgery.   His family history includes Emphysema in his father; Heart disease in his brother; Heart failure in his mother; Lung cancer in his father.He reports that he quit smoking about 2 months ago. His smoking use included Cigarettes. He started smoking about 41 years ago. He has a 70 pack-year smoking history. He has never used smokeless tobacco. He reports that he drinks about 25.2 oz of alcohol per week. He reports that he does not use illicit drugs.  Outpatient Prescriptions Prior to Visit  Medication Sig Dispense Refill  . albuterol (PROAIR HFA) 108 (90 BASE) MCG/ACT inhaler Inhale 2 puffs into the lungs every 6 (six) hours as needed for wheezing or shortness of breath. 8 g 11  . ALPRAZolam (XANAX) 0.5 MG tablet Take 1 tablet (0.5 mg total) by mouth 3 (three) times daily as needed for anxiety. 90 tablet 5  . aspirin 81 MG tablet Take 81 mg by mouth daily. Hold's medication prior to spinal injections    . budesonide-formoterol (SYMBICORT) 160-4.5 MCG/ACT inhaler Take 2 puffs first thing in am and then another 2 puffs about 12 hours later. 3 Inhaler 0  . cholecalciferol (VITAMIN D) 1000 UNITS tablet Take 1,000 Units by mouth daily.     . famotidine (PEPCID) 20 MG tablet One at bedtime 30 tablet 2  . folic acid (FOLVITE) 1 MG tablet Take 1 tablet (1 mg total) by mouth daily. 90 tablet 3  . levothyroxine (SYNTHROID, LEVOTHROID)  50 MCG tablet Take 1 tablet (50 mcg total) by mouth daily before breakfast. Wait 1 hour after taking before eating or drinking anything but water. 90 tablet 3  . magic mouthwash SOLN Take 10 mLs by mouth 4 (four) times daily as needed for mouth pain (after using inhaler). 240 mL 11  . meloxicam (MOBIC) 15 MG tablet Take 1 tablet (15 mg total) by mouth daily. (Patient taking  differently: Take 15 mg by mouth daily as needed. ) 30 tablet 5  . NIFEdipine (PROCARDIA XL/ADALAT-CC) 90 MG 24 hr tablet TAKE 1 TABLET (90 MG TOTAL) BY MOUTH DAILY. 90 tablet 0  . pantoprazole (PROTONIX) 40 MG tablet Take 1 tablet (40 mg total) by mouth daily. Take 30-60 min before first meal of the day 30 tablet 2  . valsartan-hydrochlorothiazide (DIOVAN-HCT) 320-25 MG per tablet TAKE 1 TABLET BY MOUTH DAILY. 90 tablet 1  . vitamin B-12 (CYANOCOBALAMIN) 250 MCG tablet Take 250 mcg by mouth daily.    . DULoxetine (CYMBALTA) 30 MG capsule Take 1 capsule (30 mg total) by mouth daily. For one week then two daily. Take with a full stomach at suppertime 60 capsule 0   No facility-administered medications prior to visit.    ROS Review of Systems  Constitutional: Negative for fever, chills, diaphoresis and unexpected weight change.  HENT: Negative for congestion, hearing loss, rhinorrhea and sore throat.   Eyes: Negative for visual disturbance.  Respiratory: Positive for shortness of breath. Negative for cough.   Cardiovascular: Negative for chest pain.  Gastrointestinal: Negative for abdominal pain, diarrhea and constipation.  Genitourinary: Negative for dysuria and flank pain.  Musculoskeletal: Negative for joint swelling and arthralgias.  Skin: Negative for rash.  Neurological: Positive for light-headedness. Negative for headaches.  Psychiatric/Behavioral: Positive for sleep disturbance. Negative for dysphoric mood. The patient is nervous/anxious.     Objective:  BP 97/74 mmHg  Pulse 109  Temp(Src) 97.4 F (36.3 C) (Oral)  Ht 5' 10"  (1.778 m)  Wt 190 lb 12.8 oz (86.546 kg)  BMI 27.38 kg/m2  SpO2 97%  BP Readings from Last 3 Encounters:  04/06/15 97/74  03/07/15 129/84  02/21/15 122/90    Wt Readings from Last 3 Encounters:  04/06/15 190 lb 12.8 oz (86.546 kg)  03/07/15 181 lb (82.101 kg)  02/21/15 180 lb (81.647 kg)     Physical Exam  Constitutional: He is oriented to  person, place, and time. He appears well-developed and well-nourished. No distress.  HENT:  Head: Normocephalic and atraumatic.  Right Ear: External ear normal.  Left Ear: External ear normal.  Nose: Nose normal.  Mouth/Throat: Oropharynx is clear and moist.  Eyes: Conjunctivae and EOM are normal. Pupils are equal, round, and reactive to light.  Neck: Normal range of motion. Neck supple. No thyromegaly present.  Cardiovascular: Normal rate, regular rhythm and normal heart sounds.   No murmur heard. Pulmonary/Chest: Effort normal. No respiratory distress. He has no wheezes. He has no rales.  Breath sounds are distant but clear. There is a decreased expiratory phase.  Abdominal: Soft. Bowel sounds are normal. He exhibits no distension. There is no tenderness.  Lymphadenopathy:    He has no cervical adenopathy.  Neurological: He is alert and oriented to person, place, and time. He has normal reflexes.  Skin: Skin is warm and dry.  Psychiatric: He has a normal mood and affect. His behavior is normal. Judgment and thought content normal.    Lab Results  Component Value Date   HGBA1C 5.2  04/11/2015    Lab Results  Component Value Date   WBC 11.2* 10/20/2014   HGB 14.7 10/20/2014   HCT 41.9 10/20/2014   PLT 182 10/20/2014   GLUCOSE 128* 04/06/2015   CHOL 225* 02/04/2015   TRIG 72 02/04/2015   HDL 97 02/04/2015   LDLCALC 114* 02/04/2015   ALT 24 04/06/2015   AST 22 04/06/2015   NA 136 04/06/2015   K 4.4 04/06/2015   CL 94* 04/06/2015   CREATININE 1.01 04/06/2015   BUN 10 04/06/2015   CO2 26 04/06/2015   TSH 1.780 01/28/2015   PSA 0.9 02/06/2013   INR 0.98 10/20/2010   HGBA1C 5.2 04/11/2015    Dg Bone Density  03/14/2015  EXAM: DUAL X-RAY ABSORPTIOMETRY (DXA) FOR BONE MINERAL DENSITY Referring Physician:  Jovita Gamma PATIENT: Name: Carlin, Attridge Patient ID: [id number] Birth Date: 1955/08/10 Height: 68.0 in. Sex: Male Measured: 03/14/2015 Weight: 184.0 lbs.  Indications: Caucasian, Corticosteroid, Height Loss (781.91), Hypothyroid, Low Calcium Intake (269.3), Synthroid, History of Fracture (Adult) (V15.51) Fractures: Spine Treatments: Vitamin D (E933.5) ASSESSMENT: The BMD measured at Femur Total Right is 0.818 g/cm2 with a T-score of -1.5 is considered OSTEOPENIC. Treatment is advised if there are other risk factors. L- 1, L-4 were excluded due to degenerative and surgical changes . Site Region Measured Date Measured Age WHO YA BMD Significant CHANGE Classification T-score DualFemur Total Right 03/14/2015 57.2 N/A -1.5 0.818 g/cm2 AP Spine L2-L3 03/14/2015 57.2 N/A 0.4 1.260 g/cm2 World Health Organization [facility]) criteria for post-menopausal, Caucasian Women: Normal       T-score at or above -1 SD Osteopenia   T-score between -1 and -2.5 SD Osteoporosis T-score at or below -2.5 SD RECOMMENDATION: [facility] recommends that FDA-approved medical therapies be considered in postmenopausal women and men age 46 or older with a: 1. Hip or vertebral (clinical or morphometric) fracture. 2. T-score of <-2.5 at the spine or hip. 3. Ten-year fracture probability by FRAX of 3% or greater for hip fracture or 20% or greater for major osteoporotic fracture. All treatment decisions require clinical judgment and consideration of individual patient factors, including patient preferences, co-morbidities, previous drug use, risk factors not captured in the FRAX model (e.g. falls, vitamin D deficiency, increased bone turnover, interval significant decline in bone density) and possible under - or over-estimation of fracture risk by FRAX. All patients should ensure an adequate intake of dietary calcium (1200 mg/d) and vitamin D (800 IU daily) unless contraindicated. FOLLOW-UP: People with diagnosed cases of osteoporosis or osteopenia should be regularly tested for bone mineral density. For patients eligible for Medicare, routine testing is allowed once every 2 years. The  testing frequency can be increased to one year for patients who have rapidly progressing disease, or for those who are receiving medical therapy to restore bone mass. I have reviewed this report, and agree with the above findings. Mark A. Thornton Papas, M.D. [facility] FRAX* 10-year Probability of Fracture Based on femoral neck BMD: DualFemur (Right) Major Osteoporotic Fracture: 10.6% Hip Fracture:                1.5% Population:                  [place] (Caucasian) Risk Factors:                History of Fracture (Adult) (V15.51) *FRAX is a Materials engineer of the [employer] of [employer] for Metabolic Bone Disease, a World Pharmacologist (WHO) [facility]. ASSESSMENT:  The probability of a major osteoporitic fracture is 10.6 % within the next ten years. The probability of a hip fracture is 1.5 % within the next ten years. I have reviewed this report, and agree with the above findings. Mark A. Thornton Papas, M.D. [name] Electronically Signed   By: Lavonia Dana M.D.   On: 03/14/2015 09:30    Assessment & Plan:   Frank Rosales was seen today for back pain, copd and insomnia.  Diagnoses and all orders for this visit:  Generalized anxiety disorder -     DULoxetine (CYMBALTA) 30 MG capsule; Take 3 capsules (90 mg total) by mouth daily. -     CMP14+EGFR  Hypothyroidism, unspecified hypothyroidism type -     Testosterone,Free and Total -     CMP14+EGFR  Essential hypertension -     traZODone (DESYREL) 150 MG tablet; Take 1 tablet (150 mg total) by mouth at bedtime. -     CMP14+EGFR  Alcohol abuse -     Testosterone,Free and Total -     CMP14+EGFR  Cigarette smoker -     Testosterone,Free and Total -     CMP14+EGFR  Osteopenia -     Testosterone,Free and Total -     CMP14+EGFR -     risedronate (ACTONEL) 150 MG tablet; Take 1 tablet (150 mg total) by mouth every 30 (thirty) days. with water, on an empty stomach, take nothing by mouth and to not lie down for 30  minutes after each dose.  Insomnia -     traZODone (DESYREL) 150 MG tablet; Take 1 tablet (150 mg total) by mouth at bedtime.  Elevated fasting glucose -     POCT glycosylated hemoglobin (Hb A1C)  COPD GOLD III/ still smoking   Spinal stenosis of lumbar region -     DULoxetine (CYMBALTA) 30 MG capsule; Take 3 capsules (90 mg total) by mouth daily.   I have changed Frank Rosales DULoxetine. I am also having him start on traZODone and risedronate. Additionally, I am having him maintain his aspirin, cholecalciferol, vitamin Z-61, folic acid, meloxicam, albuterol, valsartan-hydrochlorothiazide, famotidine, pantoprazole, NIFEdipine, ALPRAZolam, levothyroxine, magic mouthwash, and budesonide-formoterol.  Meds ordered this encounter  Medications  . DULoxetine (CYMBALTA) 30 MG capsule    Sig: Take 3 capsules (90 mg total) by mouth daily.    Dispense:  90 capsule    Refill:  1  . traZODone (DESYREL) 150 MG tablet    Sig: Take 1 tablet (150 mg total) by mouth at bedtime.    Dispense:  30 tablet    Refill:  2  . risedronate (ACTONEL) 150 MG tablet    Sig: Take 1 tablet (150 mg total) by mouth every 30 (thirty) days. with water, on an empty stomach, take nothing by mouth and to not lie down for 30 minutes after each dose.    Dispense:  3 tablet    Refill:  3     anticipatory guidance: Decrease xanax to 1/2 tab twice daily, maximum. Smoking cessation reviewed. Patient has currently tapered off. Encouraged to continue. Offered medication support. Alcoholism discussed. I highly recommended Alcoholics Anonymous. We discussed the pros and cons of that option. Due to his lack of progress since our last discussion and his increased risk factors when combining that with other medications such as alprazolam etc. I reinforced my strong recommendation to him. Use trazodone 1/2 to one at bedtime for sleep. Monitor for adequate sleep versus AM grogginess. And adjust the dose accordingly.  Follow-up:  Return  in about 1 month (around 05/06/2015).  Claretta Fraise, M.D.

## 2015-04-10 LAB — CMP14+EGFR
A/G RATIO: 1.7 (ref 1.1–2.5)
ALBUMIN: 4.6 g/dL (ref 3.5–5.5)
ALT: 24 IU/L (ref 0–44)
AST: 22 IU/L (ref 0–40)
Alkaline Phosphatase: 86 IU/L (ref 39–117)
BILIRUBIN TOTAL: 0.8 mg/dL (ref 0.0–1.2)
BUN/Creatinine Ratio: 10 (ref 9–20)
BUN: 10 mg/dL (ref 6–24)
CALCIUM: 9.9 mg/dL (ref 8.7–10.2)
CHLORIDE: 94 mmol/L — AB (ref 97–106)
CO2: 26 mmol/L (ref 18–29)
Creatinine, Ser: 1.01 mg/dL (ref 0.76–1.27)
GFR calc non Af Amer: 82 mL/min/{1.73_m2} (ref >59)
GFR, EST AFRICAN AMERICAN: 95 mL/min/{1.73_m2} (ref >59)
GLOBULIN, TOTAL: 2.7 g/dL (ref 1.5–4.5)
Glucose: 128 mg/dL — ABNORMAL HIGH (ref 65–99)
POTASSIUM: 4.4 mmol/L (ref 3.5–5.2)
Sodium: 136 mmol/L (ref 136–144)
Total Protein: 7.3 g/dL (ref 6.0–8.5)

## 2015-04-10 LAB — TESTOSTERONE,FREE AND TOTAL
TESTOSTERONE FREE: 9.8 pg/mL (ref 7.2–24.0)
Testosterone: 672 ng/dL (ref 348–1197)

## 2015-04-11 LAB — POCT GLYCOSYLATED HEMOGLOBIN (HGB A1C): HEMOGLOBIN A1C: 5.2

## 2015-04-11 MED ORDER — RISEDRONATE SODIUM 150 MG PO TABS: 150.0000 mg | ORAL_TABLET | ORAL | Status: DC

## 2015-05-01 ENCOUNTER — Encounter (HOSPITAL_COMMUNITY): Payer: Self-pay

## 2015-05-01 ENCOUNTER — Emergency Department (HOSPITAL_COMMUNITY): Payer: 59

## 2015-05-01 ENCOUNTER — Inpatient Hospital Stay (HOSPITAL_COMMUNITY)
Admission: EM | Admit: 2015-05-01 | Discharge: 2015-05-04 | DRG: 200 | Disposition: A | Discharge status: Home / Self Care | Payer: 59 | Attending: General Surgery | Admitting: General Surgery

## 2015-05-01 DIAGNOSIS — S2241XA Multiple fractures of ribs, right side, initial encounter for closed fracture: Secondary | ICD-10-CM | POA: Diagnosis present

## 2015-05-01 DIAGNOSIS — F411 Generalized anxiety disorder: Secondary | ICD-10-CM | POA: Diagnosis present

## 2015-05-01 DIAGNOSIS — J45909 Unspecified asthma, uncomplicated: Secondary | ICD-10-CM | POA: Diagnosis present

## 2015-05-01 DIAGNOSIS — S272XXA Traumatic hemopneumothorax, initial encounter: Principal | ICD-10-CM | POA: Diagnosis present

## 2015-05-01 DIAGNOSIS — Z79899 Other long term (current) drug therapy: Secondary | ICD-10-CM

## 2015-05-01 DIAGNOSIS — J449 Chronic obstructive pulmonary disease, unspecified: Secondary | ICD-10-CM | POA: Diagnosis present

## 2015-05-01 DIAGNOSIS — E039 Hypothyroidism, unspecified: Secondary | ICD-10-CM | POA: Diagnosis present

## 2015-05-01 DIAGNOSIS — W01198A Fall on same level from slipping, tripping and stumbling with subsequent striking against other object, initial encounter: Secondary | ICD-10-CM | POA: Diagnosis present

## 2015-05-01 DIAGNOSIS — Z7982 Long term (current) use of aspirin: Secondary | ICD-10-CM | POA: Diagnosis not present

## 2015-05-01 DIAGNOSIS — Y92002 Bathroom of unspecified non-institutional (private) residence single-family (private) house as the place of occurrence of the external cause: Secondary | ICD-10-CM | POA: Diagnosis not present

## 2015-05-01 DIAGNOSIS — E7800 Pure hypercholesterolemia, unspecified: Secondary | ICD-10-CM | POA: Diagnosis present

## 2015-05-01 DIAGNOSIS — R079 Chest pain, unspecified: Secondary | ICD-10-CM

## 2015-05-01 DIAGNOSIS — M1990 Unspecified osteoarthritis, unspecified site: Secondary | ICD-10-CM | POA: Diagnosis present

## 2015-05-01 DIAGNOSIS — J939 Pneumothorax, unspecified: Secondary | ICD-10-CM | POA: Diagnosis present

## 2015-05-01 DIAGNOSIS — I10 Essential (primary) hypertension: Secondary | ICD-10-CM | POA: Diagnosis present

## 2015-05-01 DIAGNOSIS — R0781 Pleurodynia: Secondary | ICD-10-CM | POA: Diagnosis not present

## 2015-05-01 DIAGNOSIS — Z87891 Personal history of nicotine dependence: Secondary | ICD-10-CM | POA: Diagnosis not present

## 2015-05-01 DIAGNOSIS — W19XXXA Unspecified fall, initial encounter: Secondary | ICD-10-CM | POA: Diagnosis present

## 2015-05-01 LAB — CBC
HCT: 40.4 % (ref 39.0–52.0)
Hemoglobin: 14.5 g/dL (ref 13.0–17.0)
MCH: 35 pg — AB (ref 26.0–34.0)
MCHC: 35.9 g/dL (ref 30.0–36.0)
MCV: 97.6 fL (ref 78.0–100.0)
PLATELETS: 186 10*3/uL (ref 150–400)
RBC: 4.14 MIL/uL — AB (ref 4.22–5.81)
RDW: 12.6 % (ref 11.5–15.5)
WBC: 10.5 10*3/uL (ref 4.0–10.5)

## 2015-05-01 LAB — CREATININE, SERUM
Creatinine, Ser: 0.74 mg/dL (ref 0.61–1.24)
GFR calc Af Amer: >60 mL/min (ref >60)
GFR calc non Af Amer: >60 mL/min (ref >60)

## 2015-05-01 MED ORDER — PANTOPRAZOLE SODIUM 40 MG PO TBEC
40.0000 mg | DELAYED_RELEASE_TABLET | Freq: Every day | ORAL | Status: DC
  Administered 2015-05-02 – 2015-05-03 (×2): 40 mg via ORAL
  Filled 2015-05-01 (×3): qty 1

## 2015-05-01 MED ORDER — METHOCARBAMOL 750 MG PO TABS
750.0000 mg | ORAL_TABLET | Freq: Four times a day (QID) | ORAL | Status: DC | PRN
  Administered 2015-05-01 – 2015-05-02 (×3): 750 mg via ORAL
  Filled 2015-05-01: qty 2
  Filled 2015-05-01 (×2): qty 1

## 2015-05-01 MED ORDER — ONDANSETRON HCL 4 MG/2ML IJ SOLN: 4.0000 mg | Freq: Four times a day (QID) | INTRAMUSCULAR | Status: DC | PRN

## 2015-05-01 MED ORDER — ALBUTEROL SULFATE HFA 108 (90 BASE) MCG/ACT IN AERS: 2.0000 | INHALATION_SPRAY | Freq: Four times a day (QID) | RESPIRATORY_TRACT | Status: DC | PRN

## 2015-05-01 MED ORDER — OXYCODONE HCL 5 MG PO TABS: 5.0000 mg | ORAL_TABLET | Freq: Once | ORAL | Status: DC

## 2015-05-01 MED ORDER — OXYCODONE HCL 5 MG PO TABS
10.0000 mg | ORAL_TABLET | ORAL | Status: DC | PRN
  Administered 2015-05-02: 10 mg via ORAL
  Filled 2015-05-01: qty 2

## 2015-05-01 MED ORDER — ONDANSETRON HCL 4 MG/2ML IJ SOLN
4.0000 mg | Freq: Once | INTRAMUSCULAR | Status: AC
  Administered 2015-05-01: 4 mg via INTRAVENOUS
  Filled 2015-05-01: qty 2

## 2015-05-01 MED ORDER — LEVOTHYROXINE SODIUM 50 MCG PO TABS
50.0000 ug | ORAL_TABLET | Freq: Every day | ORAL | Status: DC
  Administered 2015-05-02 – 2015-05-04 (×3): 50 ug via ORAL
  Filled 2015-05-01 (×3): qty 1

## 2015-05-01 MED ORDER — HYDROMORPHONE HCL 1 MG/ML IJ SOLN
1.0000 mg | Freq: Once | INTRAMUSCULAR | Status: AC
  Administered 2015-05-01: 1 mg via INTRAVENOUS
  Filled 2015-05-01: qty 1

## 2015-05-01 MED ORDER — ACETAMINOPHEN 500 MG PO TABS
1000.0000 mg | ORAL_TABLET | Freq: Once | ORAL | Status: AC
  Administered 2015-05-01: 1000 mg via ORAL
  Filled 2015-05-01: qty 2

## 2015-05-01 MED ORDER — IBUPROFEN 400 MG PO TABS: 400.0000 mg | ORAL_TABLET | Freq: Four times a day (QID) | ORAL | Status: DC | PRN

## 2015-05-01 MED ORDER — KETOROLAC TROMETHAMINE 30 MG/ML IJ SOLN: 30.0000 mg | Freq: Once | INTRAMUSCULAR | Status: DC

## 2015-05-01 MED ORDER — FAMOTIDINE 20 MG PO TABS
20.0000 mg | ORAL_TABLET | Freq: Every day | ORAL | Status: DC
  Administered 2015-05-02 – 2015-05-03 (×2): 20 mg via ORAL
  Filled 2015-05-01 (×3): qty 1

## 2015-05-01 MED ORDER — ENOXAPARIN SODIUM 40 MG/0.4ML ~~LOC~~ SOLN
40.0000 mg | SUBCUTANEOUS | Status: DC
  Administered 2015-05-01 – 2015-05-03 (×3): 40 mg via SUBCUTANEOUS
  Filled 2015-05-01 (×3): qty 0.4

## 2015-05-01 MED ORDER — SODIUM CHLORIDE 0.9 % IV BOLUS (SEPSIS)
1000.0000 mL | Freq: Once | INTRAVENOUS | Status: AC
  Administered 2015-05-01: 1000 mL via INTRAVENOUS

## 2015-05-01 MED ORDER — MORPHINE SULFATE (PF) 4 MG/ML IV SOLN
4.0000 mg | INTRAVENOUS | Status: DC | PRN
  Administered 2015-05-01 – 2015-05-02 (×3): 4 mg via INTRAVENOUS
  Filled 2015-05-01 (×3): qty 1

## 2015-05-01 MED ORDER — ONDANSETRON HCL 4 MG PO TABS: 4.0000 mg | ORAL_TABLET | Freq: Four times a day (QID) | ORAL | Status: DC | PRN

## 2015-05-01 MED ORDER — BUDESONIDE-FORMOTEROL FUMARATE 160-4.5 MCG/ACT IN AERO
2.0000 | INHALATION_SPRAY | Freq: Two times a day (BID) | RESPIRATORY_TRACT | Status: DC
  Administered 2015-05-01 – 2015-05-04 (×6): 2 via RESPIRATORY_TRACT
  Filled 2015-05-01: qty 6

## 2015-05-01 MED ORDER — DEXTROSE-NACL 5-0.45 % IV SOLN
INTRAVENOUS | Status: DC
Start: 2015-05-01 — End: 2015-05-02
  Administered 2015-05-01: 19:00:00 via INTRAVENOUS

## 2015-05-01 MED ORDER — ALPRAZOLAM 0.5 MG PO TABS
0.5000 mg | ORAL_TABLET | Freq: Three times a day (TID) | ORAL | Status: DC | PRN
  Administered 2015-05-01 – 2015-05-04 (×2): 0.5 mg via ORAL
  Filled 2015-05-01 (×2): qty 1

## 2015-05-01 MED ORDER — ALBUTEROL SULFATE (2.5 MG/3ML) 0.083% IN NEBU
2.5000 mg | INHALATION_SOLUTION | Freq: Four times a day (QID) | RESPIRATORY_TRACT | Status: DC | PRN
  Administered 2015-05-01 – 2015-05-03 (×2): 2.5 mg via RESPIRATORY_TRACT
  Filled 2015-05-01 (×2): qty 3

## 2015-05-01 NOTE — ED Notes (Signed)
Patient transported to X-ray 

## 2015-05-01 NOTE — ED Provider Notes (Signed)
CSN: [id number]     Arrival date & time 05/01/15  1436 History   First MD Initiated Contact with Patient 05/01/15 1452     Chief Complaint  Patient presents with  . Rib Fracture     (Consider location/radiation/quality/duration/timing/severity/associated sxs/prior Treatment) Patient is a 59 y.o. male presenting with fall. The history is provided by the patient.  Fall This is a new problem. The current episode started yesterday. The problem occurs constantly. The problem has not changed since onset.Associated symptoms include chest pain. Pertinent negatives include no abdominal pain, no headaches and no shortness of breath. The symptoms are aggravated by walking, bending and twisting. Nothing relieves the symptoms. He has tried nothing for the symptoms. The treatment provided no relief.    59 yo M  With a chief complaint of a fall. Fell last night patient slipped on something that was on the bathroom floor and fell with his right side against the tub. Patient complaining of right sided pain worse with movement deep breaths. Pain is severe and worsening throughout the day. Went to urgent care where he obtain an x-ray thought to have multiple rib fractures on that side. Question from the urgent care physician if there is a pneumothorax or not was transferred here.  Past Medical History  Diagnosis Date  . High blood pressure   . Asthma   . High cholesterol   . Hilar adenopathy   . COPD (chronic obstructive pulmonary disease) ([place])   . Anxiety   . Colon polyps   . Hypothyroidism   . Arthritis     "neck; lower back; right hip; right knee" (10/18/2014)   Past Surgical History  Procedure Laterality Date  . Knee arthroscopy Right 05/14/2010  . Wisdom tooth extraction    . Epidural injections    . Fixation kyphoplasty lumbar spine      "L1"  . Back surgery     Family History  Problem Relation Age of Onset  . Emphysema Father   . Heart disease Brother   . Heart failure Mother   . Lung  cancer Father    Social History  Substance Use Topics  . Smoking status: Former Smoker -- 1.75 packs/day for 40 years    Types: Cigarettes    Start date: 05/14/1973    Quit date: 02/07/2015  . Smokeless tobacco: Never Used     Comment:     . Alcohol Use: 25.2 oz/week    42 Cans of beer per week     Comment: 10/18/2014 "6 beers per day"    Review of Systems  Constitutional: Negative for fever and chills.  HENT: Negative for congestion and facial swelling.   Eyes: Negative for discharge and visual disturbance.  Respiratory: Negative for shortness of breath.   Cardiovascular: Positive for chest pain. Negative for palpitations.  Gastrointestinal: Negative for vomiting, abdominal pain and diarrhea.  Musculoskeletal: Negative for myalgias and arthralgias.  Skin: Negative for color change and rash.  Neurological: Negative for tremors, syncope and headaches.  Psychiatric/Behavioral: Negative for confusion and dysphoric mood.      Allergies  Review of patient's allergies indicates no known allergies.  Home Medications   Prior to Admission medications   Medication Sig Start Date End Date Taking? Authorizing Provider  albuterol (PROAIR HFA) 108 (90 BASE) MCG/ACT inhaler Inhale 2 puffs into the lungs every 6 (six) hours as needed for wheezing or shortness of breath. 04/20/14  Yes Lysbeth Penner, FNP  ALPRAZolam Duanne Moron) 0.5 MG tablet Take 1  tablet (0.5 mg total) by mouth 3 (three) times daily as needed for anxiety. 02/04/15  Yes Claretta Fraise, MD  aspirin 81 MG tablet Take 81 mg by mouth daily. Hold's medication prior to spinal injections   Yes Historical Provider, MD  budesonide-formoterol (SYMBICORT) 160-4.5 MCG/ACT inhaler Take 2 puffs first thing in am and then another 2 puffs about 12 hours later. 03/15/15  Yes Tanda Rockers, MD  cholecalciferol (VITAMIN D) 1000 UNITS tablet Take 1,000 Units by mouth daily.    Yes Historical Provider, MD  DULoxetine (CYMBALTA) 30 MG capsule Take 3  capsules (90 mg total) by mouth daily. 04/06/15  Yes Claretta Fraise, MD  folic acid (FOLVITE) 1 MG tablet Take 1 tablet (1 mg total) by mouth daily. 02/06/13  Yes Lysbeth Penner, FNP  levothyroxine (SYNTHROID, LEVOTHROID) 50 MCG tablet Take 1 tablet (50 mcg total) by mouth daily before breakfast. Wait 1 hour after taking before eating or drinking anything but water. 02/04/15  Yes Claretta Fraise, MD  magic mouthwash SOLN Take 10 mLs by mouth 4 (four) times daily as needed for mouth pain (after using inhaler). 03/07/15  Yes Claretta Fraise, MD  meloxicam (MOBIC) 15 MG tablet Take 1 tablet (15 mg total) by mouth daily. Patient taking differently: Take 15 mg by mouth daily as needed.  11/19/13  Yes Mary-Margaret Hassell Done, FNP  NIFEdipine (PROCARDIA XL/ADALAT-CC) 90 MG 24 hr tablet TAKE 1 TABLET (90 MG TOTAL) BY MOUTH DAILY. 01/31/15  Yes Claretta Fraise, MD  risedronate (ACTONEL) 150 MG tablet Take 1 tablet (150 mg total) by mouth every 30 (thirty) days. with water, on an empty stomach, take nothing by mouth and to not lie down for 30 minutes after each dose. 04/11/15  Yes Claretta Fraise, MD  valsartan-hydrochlorothiazide (DIOVAN-HCT) 320-25 MG per tablet TAKE 1 TABLET BY MOUTH DAILY. 12/27/14  Yes Claretta Fraise, MD  vitamin B-12 (CYANOCOBALAMIN) 250 MCG tablet Take 250 mcg by mouth daily.   Yes Historical Provider, MD  famotidine (PEPCID) 20 MG tablet One at bedtime 01/24/15   Tanda Rockers, MD  pantoprazole (PROTONIX) 40 MG tablet Take 1 tablet (40 mg total) by mouth daily. Take 30-60 min before first meal of the day 01/24/15   Tanda Rockers, MD  traZODone (DESYREL) 150 MG tablet Take 1 tablet (150 mg total) by mouth at bedtime. 04/06/15   Claretta Fraise, MD   BP 154/100 mmHg  Pulse 121  Temp(Src) 97.7 F (36.5 C) (Oral)  Resp 23  SpO2 95% Physical Exam  Constitutional: He is oriented to person, place, and time. He appears well-developed and well-nourished.  HENT:  Head: Normocephalic and atraumatic.  Eyes:  EOM are normal. Pupils are equal, round, and reactive to light.  Neck: Normal range of motion. Neck supple. No JVD present.  Cardiovascular: Regular rhythm.  Tachycardia present.  Exam reveals no gallop and no friction rub.   No murmur heard. Pulmonary/Chest: No respiratory distress. He has no wheezes.    Abdominal: He exhibits no distension. There is no tenderness. There is no rebound and no guarding.  Musculoskeletal: Normal range of motion.  Neurological: He is alert and oriented to person, place, and time.  Skin: No rash noted. No pallor.  Psychiatric: He has a normal mood and affect. His behavior is normal.  Nursing note and vitals reviewed.   ED Course  Procedures (including critical care time) Labs Review Labs Reviewed - No data to display  Imaging Review No results found. I have personally reviewed  and evaluated these images and lab results as part of my medical decision-making.   EKG Interpretation None      MDM   Final diagnoses:  Multiple rib fractures, right, closed, initial encounter    59 yo M  With a chief complaint of right side pain. Will obtain an x-ray to evaluate for rib fractures. Patient is tachycardic and diaphoretic on initial exam.   Review of outside x-ray read as trace apical pneumothorax. Repeated here no definitive pneumothorax identified by myself.  Discussed case with Dr. Kieth Brightly, will come eval the patient.    Trauma surgery will admit.  The patients results and plan were reviewed and discussed.   Any x-rays performed were independently reviewed by myself.   Differential diagnosis were considered with the presenting HPI.  Medications  sodium chloride 0.9 % bolus 1,000 mL (1,000 mLs Intravenous New Bag/Given 05/01/15 1553)  acetaminophen (TYLENOL) tablet 1,000 mg (1,000 mg Oral Given 05/01/15 1552)  HYDROmorphone (DILAUDID) injection 1 mg (1 mg Intravenous Given 05/01/15 1552)  ondansetron (ZOFRAN) injection 4 mg (4 mg Intravenous  Given 05/01/15 1552)    Filed Vitals:   05/01/15 1440  BP: 154/100  Pulse: 121  Temp: 97.7 F (36.5 C)  TempSrc: Oral  Resp: 23  SpO2: 95%    Final diagnoses:  Multiple rib fractures, right, closed, initial encounter    Admission/ observation were discussed with the admitting physician, patient and/or family and they are comfortable with the plan.    Deno Etienne, DO 05/01/15 1555

## 2015-05-01 NOTE — ED Notes (Signed)
Per EMS - pt fell last night - tripped, landed on right side. Pt has swelling to right back. Xray showed multiple fractured ribs. Pt ambulatory. Hx COPD, htn, arthritis issues. Mild wheezing - normal for pt. Mildly tachycardic. Does not wear O2 at home. Intermittent pain.

## 2015-05-01 NOTE — Progress Notes (Signed)
Pt admitted from ED this evening at 1830hrs. Alert,oriented and able to voice needs. Denies pain at this time. Admission fully completed. Family at bedside

## 2015-05-01 NOTE — H&P (Signed)
History   Frank Rosales is an 59 y.o. male.   Chief Complaint:  Chief Complaint  Patient presents with  . Rib Fracture    Trauma Mechanism of injury: fall Injury location: torso Injury location detail: R chest Incident location: home Time since incident: 18 hours (18) Arrived directly from scene: no   Fall:      Fall occurred: in the bathroom      Impact surface: bathtub.      Point of impact: back      Suspicion of alcohol use: no      Suspicion of drug use: no  EMS/PTA data:      Bystander interventions: none      Ambulatory at scene: yes      Blood loss: none      Responsiveness: alert      Oriented to: person, place, situation and time      Loss of consciousness: no      Breathing interventions: none      IV access: none      Fluids administered: none      Cardiac interventions: none  Current symptoms:      Pain scale: 8/10      Pain quality: cramping      Associated symptoms:            Reports chest pain and difficulty breathing.            Denies abdominal pain, headache, hearing loss, loss of consciousness, nausea, neck pain and vomiting.   Pt recently quick smoking. He went to urgent care and was found to have 4 rib fx and small R PTX.  Past Medical History  Diagnosis Date  . High blood pressure   . Asthma   . High cholesterol   . Hilar adenopathy   . COPD (chronic obstructive pulmonary disease) ([place])   . Anxiety   . Colon polyps   . Hypothyroidism   . Arthritis     "neck; lower back; right hip; right knee" (10/18/2014)    Past Surgical History  Procedure Laterality Date  . Knee arthroscopy Right 05/14/2010  . Wisdom tooth extraction    . Epidural injections    . Fixation kyphoplasty lumbar spine      "L1"  . Back surgery      Family History  Problem Relation Age of Onset  . Emphysema Father   . Heart disease Brother   . Heart failure Mother   . Lung cancer Father    Social History:  reports that he quit smoking about 2 months ago.  His smoking use included Cigarettes. He started smoking about 41 years ago. He has a 70 pack-year smoking history. He has never used smokeless tobacco. He reports that he drinks about 25.2 oz of alcohol per week. He reports that he does not use illicit drugs.  Allergies  No Known Allergies  Home Medications   (Not in a hospital admission)  Trauma Course  No results found for this or any previous visit (from the past 48 hour(s)). Dg Chest 2 View  05/01/2015  CLINICAL DATA:  Fall. EXAM: CHEST  2 VIEW COMPARISON:  05/01/2015 FINDINGS: Normal heart size. No pleural effusion identified. The previously noted tiny right apical pneumothorax is not visualized. There is atelectasis in the right base with adjacent rib fractures. Subcutaneous emphysema within right lateral chest wall and right supraclavicular region is again noted. IMPRESSION: 1. No change in right chest wall subcutaneous emphysema. 2. Small right  apical pneumothorax is no longer visualized. 3. Right base atelectasis. Electronically Signed   By: Kerby Moors M.D.   On: 05/01/2015 16:07    Review of Systems  Constitutional: Negative for fever and chills.  HENT: Negative for hearing loss.   Eyes: Negative for blurred vision and double vision.  Respiratory: Positive for shortness of breath. Negative for cough and hemoptysis.   Cardiovascular: Positive for chest pain. Negative for palpitations and orthopnea.  Gastrointestinal: Negative for heartburn, nausea, vomiting and abdominal pain.  Genitourinary: Negative for dysuria and urgency.  Musculoskeletal: Negative for myalgias and neck pain.  Skin: Negative for itching and rash.  Neurological: Negative for dizziness, tingling, tremors, sensory change, loss of consciousness and headaches.  Endo/Heme/Allergies: Negative for environmental allergies. Does not bruise/bleed easily.  Psychiatric/Behavioral: Negative for depression and suicidal ideas.    Blood pressure 154/100, pulse 121,  temperature 97.7 F (36.5 C), temperature source Oral, resp. rate 23, SpO2 95 %. Physical Exam  Constitutional: He is oriented to person, place, and time. He appears well-developed and well-nourished.  HENT:  Head: Normocephalic and atraumatic.  Eyes: Conjunctivae are normal. Pupils are equal, round, and reactive to light.  Neck: Normal range of motion. Neck supple.  Cardiovascular: Normal rate and regular rhythm.   Respiratory: Breath sounds normal.  Tenderness over right posterior chest  GI: He exhibits no distension and no mass. There is no tenderness. There is no rebound.  Musculoskeletal: He exhibits no edema or tenderness.  Neurological: He is alert and oriented to person, place, and time.  Skin: Skin is warm and dry.  Psychiatric: He has a normal mood and affect. His behavior is normal.     Assessment/Plan 59 yo male with 4 r rib fx after fall onto bathtub. No LOC. Small apical PTX on XR. -oxygen therapy -pain control -IS -reshoot XR in am -NPO except sips for tonight  Arta Bruce Kinsinger 05/01/2015, 4:09 PM   Procedures

## 2015-05-02 ENCOUNTER — Inpatient Hospital Stay (HOSPITAL_COMMUNITY): Payer: 59

## 2015-05-02 ENCOUNTER — Other Ambulatory Visit: Payer: Self-pay | Admitting: Family Medicine

## 2015-05-02 DIAGNOSIS — W19XXXA Unspecified fall, initial encounter: Secondary | ICD-10-CM | POA: Diagnosis present

## 2015-05-02 DIAGNOSIS — S272XXA Traumatic hemopneumothorax, initial encounter: Secondary | ICD-10-CM | POA: Diagnosis present

## 2015-05-02 DIAGNOSIS — S2241XA Multiple fractures of ribs, right side, initial encounter for closed fracture: Secondary | ICD-10-CM | POA: Diagnosis present

## 2015-05-02 MED ORDER — VITAMIN D 1000 UNITS PO TABS
1000.0000 [IU] | ORAL_TABLET | Freq: Every day | ORAL | Status: DC
  Administered 2015-05-02 – 2015-05-04 (×3): 1000 [IU] via ORAL
  Filled 2015-05-02 (×3): qty 1

## 2015-05-02 MED ORDER — DULOXETINE HCL 60 MG PO CPEP
90.0000 mg | ORAL_CAPSULE | Freq: Every day | ORAL | Status: DC
  Administered 2015-05-02 – 2015-05-03 (×2): 90 mg via ORAL
  Filled 2015-05-02 (×3): qty 1

## 2015-05-02 MED ORDER — MORPHINE SULFATE (PF) 2 MG/ML IV SOLN: 2.0000 mg | INTRAVENOUS | Status: DC | PRN

## 2015-05-02 MED ORDER — NIFEDIPINE ER OSMOTIC RELEASE 90 MG PO TB24
90.0000 mg | ORAL_TABLET | Freq: Every day | ORAL | Status: DC
  Administered 2015-05-02 – 2015-05-04 (×3): 90 mg via ORAL
  Filled 2015-05-02 (×4): qty 1

## 2015-05-02 MED ORDER — DOCUSATE SODIUM 100 MG PO CAPS
100.0000 mg | ORAL_CAPSULE | Freq: Two times a day (BID) | ORAL | Status: DC
  Administered 2015-05-02 – 2015-05-04 (×5): 100 mg via ORAL
  Filled 2015-05-02 (×5): qty 1

## 2015-05-02 MED ORDER — CYANOCOBALAMIN 250 MCG PO TABS
250.0000 ug | ORAL_TABLET | Freq: Every day | ORAL | Status: DC
  Administered 2015-05-02 – 2015-05-04 (×3): 250 ug via ORAL
  Filled 2015-05-02 (×4): qty 1

## 2015-05-02 MED ORDER — ASPIRIN 81 MG PO CHEW
81.0000 mg | CHEWABLE_TABLET | Freq: Every day | ORAL | Status: DC
  Administered 2015-05-02 – 2015-05-04 (×3): 81 mg via ORAL
  Filled 2015-05-02 (×3): qty 1

## 2015-05-02 MED ORDER — METHOCARBAMOL 750 MG PO TABS
1500.0000 mg | ORAL_TABLET | Freq: Four times a day (QID) | ORAL | Status: DC
  Administered 2015-05-02 – 2015-05-04 (×10): 1500 mg via ORAL
  Filled 2015-05-02 (×11): qty 2

## 2015-05-02 MED ORDER — HYDROCHLOROTHIAZIDE 25 MG PO TABS
25.0000 mg | ORAL_TABLET | Freq: Every day | ORAL | Status: DC
  Administered 2015-05-02 – 2015-05-04 (×3): 25 mg via ORAL
  Filled 2015-05-02 (×3): qty 1

## 2015-05-02 MED ORDER — IRBESARTAN 300 MG PO TABS
300.0000 mg | ORAL_TABLET | Freq: Every day | ORAL | Status: DC
  Administered 2015-05-02 – 2015-05-04 (×3): 300 mg via ORAL
  Filled 2015-05-02 (×3): qty 1

## 2015-05-02 MED ORDER — POLYETHYLENE GLYCOL 3350 17 G PO PACK
17.0000 g | PACK | Freq: Every day | ORAL | Status: DC
  Administered 2015-05-02 – 2015-05-04 (×3): 17 g via ORAL
  Filled 2015-05-02 (×3): qty 1

## 2015-05-02 MED ORDER — MELOXICAM 7.5 MG PO TABS
15.0000 mg | ORAL_TABLET | Freq: Every day | ORAL | Status: DC
  Administered 2015-05-02 – 2015-05-03 (×2): 15 mg via ORAL
  Filled 2015-05-02 (×3): qty 2

## 2015-05-02 MED ORDER — FOLIC ACID 1 MG PO TABS
1.0000 mg | ORAL_TABLET | Freq: Every day | ORAL | Status: DC
  Administered 2015-05-02 – 2015-05-04 (×3): 1 mg via ORAL
  Filled 2015-05-02 (×3): qty 1

## 2015-05-02 MED ORDER — OXYCODONE HCL 5 MG PO TABS
10.0000 mg | ORAL_TABLET | ORAL | Status: DC | PRN
  Administered 2015-05-02: 10 mg via ORAL
  Administered 2015-05-02: 15 mg via ORAL
  Administered 2015-05-02: 10 mg via ORAL
  Administered 2015-05-03: 20 mg via ORAL
  Administered 2015-05-03: 15 mg via ORAL
  Administered 2015-05-03: 20 mg via ORAL
  Administered 2015-05-04 (×2): 10 mg via ORAL
  Filled 2015-05-02: qty 3
  Filled 2015-05-02 (×2): qty 2
  Filled 2015-05-02 (×2): qty 4
  Filled 2015-05-02: qty 2
  Filled 2015-05-02: qty 3

## 2015-05-02 MED ORDER — VALSARTAN-HYDROCHLOROTHIAZIDE 320-25 MG PO TABS: 1.0000 | ORAL_TABLET | Freq: Every day | ORAL | Status: DC

## 2015-05-02 NOTE — Progress Notes (Signed)
Physical Therapy Evaluation Note  Clinical Impression: Pt mobility greatly limited by severe R flank pain. SpO2 in low 80s on 2Lo2 with transfers and ambulation. Sitting in chair at 92% on 2LO2 via [place]. Pt needs to be mod I prior to returning home due to pt's spouse working during the day. Suspect once pain under control pt will progress quickly.   05/02/15 1225  PT Visit Information  Last PT Received On 05/02/15  Assistance Needed +1  History of Present Illness 59 yo male with 4 r rib fx after fall onto bathtub. No LOC. Small apical PTX on XR.  Precautions  Precautions Fall  Restrictions  Weight Bearing Restrictions No  Home Living  Family/patient expects to be discharged to: Private residence  Living Arrangements Spouse/significant other  Available Help at Discharge Family;Available PRN/intermittently  Type of Home House  Home Access Stairs to enter  Entrance Stairs-Number of Steps 3  Entrance Stairs-Rails None  Home Layout One level  [place] - 2 wheels  Additional Comments wife works during the day  Prior Function  Level of Independence Independent  Comments retired  Engineer, petroleum No difficulties  Pain Assessment  Pain Assessment 0-10  Pain Score 10  Pain Location 10 R flank pain with mvmt, 3/10 at rest  Pain Descriptors / Indicators Stabbing;Shooting;Sharp  Pain Intervention(s) Premedicated before session  Cognition  Arousal/Alertness Awake/alert  Behavior During Therapy WFL for tasks assessed/performed  Overall Cognitive Status Within Functional Limits for tasks assessed  Upper Extremity Assessment  Upper Extremity Assessment (unable to test formally due to pain)  Lower Extremity Assessment  Lower Extremity Assessment Overall WFL for tasks assessed  Cervical / Trunk Assessment  Cervical / Trunk Assessment Normal  Bed Mobility  Overal bed mobility Needs Assistance  Bed Mobility Rolling;Sidelying to Sit  Rolling Min assist  Sidelying to  sit Mod assist  General bed mobility comments educated on splinting R side, assist to elevate trunk, v/c's to no hold breath  Transfers  Overall transfer level Needs assistance  Equipment used Rolling walker (2 wheeled)  Transfers Sit to/from Stand  Sit to Stand Min assist  General transfer comment v/c's to push up through LEs and splint R side with folded blanket/pillow, push up with L UE, assist to achieve full standing  Ambulation/Gait  Ambulation/Gait assistance Min guard  Ambulation Distance (Feet) 100 Feet  Assistive device Rolling walker (2 wheeled)  Gait Pattern/deviations Step-through pattern;Decreased stride length  General Gait Details attempted to amb without AD however pt with increased R UE pain. used RW pt able to tolerate better  Gait velocity decreased  Gait velocity interpretation <1.8 ft/sec, indicative of risk for recurrent falls  Balance  Overall balance assessment Needs assistance  Sitting-balance support Feet supported;Single extremity supported  Sitting balance-Leahy Scale Fair  Standing balance support No upper extremity supported  Standing balance-Leahy Scale Fair  PT - End of Session  Equipment Utilized During Treatment Gait belt  Activity Tolerance Patient limited by pain  Patient left in chair;with call bell/phone within reach;with family/visitor present (MD in room)  Nurse Communication Mobility status  PT Assessment  PT Therapy Diagnosis  Difficulty walking;Acute pain  PT Recommendation/Assessment Patient needs continued PT services  PT Problem List Decreased strength;Decreased activity tolerance;Decreased balance;Decreased mobility  Barriers to Discharge Decreased caregiver support  Barriers to Discharge Comments wife works during the day  PT Plan  PT Frequency (ACUTE ONLY) Min 3X/week  PT Treatment/Interventions (ACUTE ONLY) DME instruction;Gait training;Stair training;Functional mobility training;Therapeutic activities;Therapeutic exercise  PT  Recommendation  Follow Up Recommendations No PT follow up;Supervision/Assistance - 24 hour  PT equipment None recommended by PT  Individuals Consulted  Consulted and Agree with Results and Recommendations Patient;Family member/caregiver  Family Member Consulted sposue  Acute Rehab PT Goals  Patient Stated Goal get pain under control  PT Goal Formulation With patient  Time For Goal Achievement 05/09/15  Potential to Achieve Goals Good  PT Time Calculation  PT Start Time (ACUTE ONLY) 1225  PT Stop Time (ACUTE ONLY) 1302  PT Time Calculation (min) (ACUTE ONLY) 37 min  PT General Charges  $$ ACUTE PT VISIT 1 Procedure  PT Evaluation  $Initial PT Evaluation Tier I 1 Procedure  PT Treatments  $Gait Training 8-22 mins  Written Expression  Dominant Hand Right    Kittie Plater, PT, DPT Pager #: [phone] Office #: [phone]

## 2015-05-02 NOTE — Progress Notes (Signed)
Patient ID: Frank Rosales, male   DOB: 06/13/1955, 59 y.o.   MRN: [id number]   LOS: 1 day   Subjective: C/o pain, oral meds not helping much/at all.   Objective: Vital signs in last 24 hours: Temp:  [97.7 F (36.5 C)-98.8 F (37.1 C)] 98.8 F (37.1 C) (12/19 0618) Pulse Rate:  [95-121] 95 (12/19 0618) Resp:  [11-23] 19 (12/19 0618) BP: (112-158)/(81-101) 117/81 mmHg (12/19 0618) SpO2:  [91 %-99 %] 96 % (12/19 0618) Weight:  [83.643 kg (184 lb 6.4 oz)] 83.643 kg (184 lb 6.4 oz) (12/18 1828) Last BM Date: 04/30/15   IS: 1017ml   Radiology Results PORTABLE CHEST 1 VIEW  COMPARISON: 05/01/2015; 10/27/2014  FINDINGS: Grossly unchanged cardiac silhouette and mediastinal contours given persistently reduced lung volumes. Grossly unchanged bilateral infrahilar opacities, right greater than left. No new focal airspace opacities. Unchanged trace of sided effusion. The amount of right lateral chest wall subcutaneous emphysema has minimally decreased in the interval. No pneumothorax. Known minimally displaced right-sided posterior lateral rib fractures suboptimally evaluated.  IMPRESSION: 1. The amount of right lateral chest wall subcutaneous emphysema is minimally decreased in the interval. No pneumothorax. 2. Grossly unchanged findings of hypoventilation and bilateral infrahilar opacities, likely atelectasis.   Electronically Signed  By: Sandi Mariscal M.D.  On: 05/02/2015 07:37   Physical Exam General appearance: alert and no distress Resp: clear to auscultation bilaterally Cardio: regular rate and rhythm GI: normal findings: bowel sounds normal and soft, non-tender   Assessment/Plan: Fall Multiple right rib fxs w/PTX -- Pulmonary toilet Multiple medical problems -- Home meds FEN -- Will increase and schedule muscle relaxer, add NSAID, increase OxyIR, advance diet, SL IV, bowel regimen VTE -- SCD's, Lovenox Dispo -- Home once pain controlled and moving, Will  get PT consult; pt will be at home alone for long periods.    Lisette Abu, PA-C Pager: [phone] General Trauma PA Pager: [phone]  05/02/2015

## 2015-05-02 NOTE — Progress Notes (Signed)
2 po 0xy ir administered for c/o pain this pm

## 2015-05-03 MED ORDER — TRAMADOL HCL 50 MG PO TABS
100.0000 mg | ORAL_TABLET | Freq: Four times a day (QID) | ORAL | Status: DC
  Administered 2015-05-03 – 2015-05-04 (×7): 100 mg via ORAL
  Filled 2015-05-03 (×7): qty 2

## 2015-05-03 MED ORDER — OXYCODONE HCL ER 10 MG PO T12A
10.0000 mg | EXTENDED_RELEASE_TABLET | Freq: Two times a day (BID) | ORAL | Status: DC
  Administered 2015-05-03 – 2015-05-04 (×2): 10 mg via ORAL
  Filled 2015-05-03 (×2): qty 1

## 2015-05-03 NOTE — Clinical Social Work Note (Signed)
Clinical Social Work Assessment  Patient Details  Name: Frank Rosales MRN: [id number] Date of Birth: [birth year]  Date of referral:  05/03/15               Reason for consult:  Substance Use/ETOH Abuse, Trauma                Permission sought to share information with:  Family Supports, Chartered certified accountant granted to share information::  Yes, Verbal Permission Granted  Name::     Physicist, medical::     Relationship::  Wife  Contact Information:     Housing/Transportation Living arrangements for the past 2 months:  Single Family Home Source of Information:  Patient Patient Interpreter Needed:  None Criminal Activity/Legal Involvement Pertinent to Current Situation/Hospitalization:  No - Comment as needed Significant Relationships:  Adult Children, Spouse Lives with:  Spouse Do you feel safe going back to the place where you live?  Yes Need for family participation in patient care:  No (Coment)  Care giving concerns:  Patient states that he does not have any care giving concerns at this time. He states that his wife does work during the day but his daughter will also be able to check on him during the day.    Social Worker assessment / plan:  CSW met with patient at bedside to complete assessment and SBIRT. The patient shares that he was admitted from home after an accident he had in the middle of the night. The patient states that he was going to the bathroom and tripped over a rug. He shares that his main support is his wife and his daughter. Patient denies any current symptoms of acute stress response at this time. CSW inquired about any ETOH/substance abuse concerns. The patient states that he recently stopped smoking 2.5 months ago with a "couple" of slip ups. He states that his wife is supportive of this and she no longer smokes around him to help him in his recovery. The patient does endorse frequent alcohol use (SBIRT completed with the patient). The patient  states that he only drinks beer, but typically drinks 6-8 12oz beers when he drinks, which is about 4-5 times per week. With the patient's moderate score on SBIRT, CSW recommended the patient engage in an SAIOP post discharge and attend frequent AA meetings. Patient appears ambivalent about treatment at this time as he doesn't feel that his drinking is problematic. The patient doesn't appear to be showing signs of ETOH withdrawal at this time but does complain of a lot of pain in regards to his rib fractures. CSW provided the patient with residential and outpatient treatment options and [facility] AA meeting schedule. Patient doesn't appear to have any additional CSW needs at this time. CSW will sign off.  Employment status:  Disabled (Comment on whether or not currently receiving Disability) (Receives disability income.) Insurance information:  Managed Care PT Recommendations:  No Follow Up Information / Referral to community resources:  Residential Substance Abuse Treatment Options, Outpatient Substance Abuse Treatment Options, Support Groups, SBIRT  Patient/Family's Response to care:  Patient states that he is in pain but is happy with the care he has received.   Patient/Family's Understanding of and Emotional Response to Diagnosis, Current Treatment, and Prognosis:  The patient appears to have a good understanding of reason for his admission. He understands his post DC needs. The patient has good insight into the extent of his alcohol use and does want to try cutting  down on his use.  Emotional Assessment Appearance:  Appears stated age Attitude/Demeanor/Rapport:  Other (Patient welcoming of CSW) Affect (typically observed):  Accepting, Calm, Appropriate, Pleasant Orientation:  Oriented to Self, Oriented to Place, Oriented to  Time, Oriented to Situation Alcohol / Substance use:  Tobacco Use, Alcohol Use (Stopped smoking 2.5 months ago. Drinks 6-8 beers nearly every day. ) Psych  involvement (Current and /or in the community):  No (Comment)  Discharge Needs  Concerns to be addressed:  Substance Abuse Concerns, Other (Comment Required (No DC planning needed by CSW.) Readmission within the last 30 days:  No Current discharge risk:  Substance Abuse Barriers to Discharge:  Continued Medical Work up   [employer] MSW, [place], [place], [phone]

## 2015-05-03 NOTE — Progress Notes (Signed)
Patient ID: Frank Rosales, male   DOB: [birth year], 59 y.o.   MRN: [id number]   LOS: 2 days   Subjective: Didn't rest as well last night but feels maybe a bit better today. Still in a lot of pain.   Objective: Vital signs in last 24 hours: Temp:  [98.4 F (36.9 C)-98.5 F (36.9 C)] 98.4 F (36.9 C) (12/20 0538) Pulse Rate:  [85-102] 93 (12/20 0538) Resp:  [18-20] 19 (12/20 0538) BP: (110-113)/(70-80) 113/70 mmHg (12/20 0538) SpO2:  [90 %-98 %] 93 % (12/20 0538) Last BM Date: 04/30/15   IS: 1781ml (+727ml)   Physical Exam General appearance: alert and no distress Resp: clear to auscultation bilaterally Cardio: regular rate and rhythm GI: normal findings: bowel sounds normal and soft, non-tender   Assessment/Plan: Fall Multiple right rib fxs w/PTX -- Pulmonary toilet Multiple medical problems -- Home meds FEN -- Will schedule tramadol VTE -- SCD's, Lovenox Dispo -- Home once pain controlled and modified independent    Lisette Abu, PA-C Pager: [phone] General Trauma PA Pager: [phone]  05/03/2015

## 2015-05-03 NOTE — Progress Notes (Signed)
Physical Therapy Treatment Patient Details Name: Frank Rosales MRN: [id number] DOB: 1955/07/18 Today's Date: 05/03/2015    History of Present Illness 59 yo male with 4 r rib fx after fall onto bathtub. No LOC. Small apical PTX on XR.    PT Comments    Patient is progressing toward mobility goals with ability to ambulate >200 ft with decreased pain this session. Pt received stair training and tolerated ascend/descend of 3 steps well. Overall mobility level of min guard/supervision. Continue to progress as tolerated.  Follow Up Recommendations  No PT follow up;Supervision/Assistance - 24 hour     Equipment Recommendations  None recommended by PT    Recommendations for Other Services       Precautions / Restrictions Precautions Precautions: Fall Restrictions Weight Bearing Restrictions: No    Mobility  Bed Mobility Overal bed mobility: Needs Assistance Bed Mobility: Supine to Sit     Supine to sit: Supervision     General bed mobility comments: carry over of technique; extra time, HOB elevated, and use of bed rail; no physical assist needed  Transfers Overall transfer level: Needs assistance Equipment used: Rolling walker (2 wheeled) Transfers: Sit to/from Stand Sit to Stand: Min guard         General transfer comment: vc for hand placement and pushing up through L UE from bed; min guard for safety  Ambulation/Gait Ambulation/Gait assistance: Min guard Ambulation Distance (Feet): 250 Feet Assistive device: Rolling walker (2 wheeled) Gait Pattern/deviations: Step-through pattern Gait velocity: decreased   General Gait Details: pt with increased activity tolerance with decreased pain this session; R UE on RW mostly for balance due to pt's inability to bear weight through R UE due to pain   Stairs Stairs: Yes Stairs assistance: Min guard Stair Management: One rail Left;Forwards;Sideways Number of Stairs: 3 General stair comments: pt reported that his R  knee and hip are "bad"; instructed to ascend with L LE and descend with R LE; use of L UE only; pt reported that he is getting hand rails installed today or tomorrow at his home  Wheelchair Mobility    Modified Rankin (Stroke Patients Only)       Balance Overall balance assessment: Needs assistance Sitting-balance support: Feet supported Sitting balance-Leahy Scale: Fair     Standing balance support: No upper extremity supported Standing balance-Leahy Scale: Fair                      Cognition Arousal/Alertness: Awake/alert Behavior During Therapy: WFL for tasks assessed/performed Overall Cognitive Status: Within Functional Limits for tasks assessed                      Exercises      General Comments        Pertinent Vitals/Pain Pain Assessment: 0-10 Pain Score: 6  (3/10 at rest) Pain Location: R flank pain Pain Descriptors / Indicators: Sharp;Shooting Pain Intervention(s): Limited activity within patient's tolerance;Monitored during session;Premedicated before session    Home Living                      Prior Function            PT Goals (current goals can now be found in the care plan section) Acute Rehab PT Goals Patient Stated Goal: get pain under control PT Goal Formulation: With patient Time For Goal Achievement: 05/09/15 Potential to Achieve Goals: Good Progress towards PT goals: Progressing toward goals  Frequency  Min 3X/week    PT Plan Current plan remains appropriate    Co-evaluation             End of Session Equipment Utilized During Treatment: Gait belt Activity Tolerance: Patient tolerated treatment well Patient left: in chair;with call bell/phone within reach (pillow under R UE for support/comfort)     Time: [id number] PT Time Calculation (min) (ACUTE ONLY): 36 min  Charges:  $Gait Training: 23-37 mins                    G Codes:      [place] April, PTA Pager: [phone]   05/03/2015, 1:19 PM

## 2015-05-03 NOTE — Care Management Note (Signed)
Case Management Note  Patient Details  Name: Frank Rosales MRN: [id number] Date of Birth: 12-14-1955  Subjective/Objective:       Pt admitted on 05/01/15 s/p fall against bathtub with fractured ribs and pneumothorax.  PTA, pt independent, lives with spouse.               Action/Plan: Will follow for discharge planning as pt progresses.    Expected Discharge Date:  05/06/15               Expected Discharge Plan:     In-House Referral:     Discharge planning Services   CM Referral  Post Acute Care Choice:    Choice offered to:     DME Arranged:    DME Agency:     HH Arranged:    HH Agency:     Status of Service:   In process, will continue to follow  Medicare Important Message Given:    Date Medicare IM Given:    Medicare IM give by:    Date Additional Medicare IM Given:    Additional Medicare Important Message give by:     If discussed at [place] of Stay Meetings, dates discussed:    Additional Comments:  Reinaldo Raddle, RN, BSN  Trauma/Neuro ICU Case Manager [phone]

## 2015-05-04 MED ORDER — OXYCODONE HCL ER 10 MG PO T12A: 10.0000 mg | EXTENDED_RELEASE_TABLET | Freq: Two times a day (BID) | ORAL | Status: DC

## 2015-05-04 MED ORDER — METHOCARBAMOL 500 MG PO TABS: 1000.0000 mg | ORAL_TABLET | Freq: Four times a day (QID) | ORAL | Status: DC

## 2015-05-04 MED ORDER — TRAMADOL HCL 50 MG PO TABS
100.0000 mg | ORAL_TABLET | Freq: Four times a day (QID) | ORAL | Status: DC
Start: 2015-05-04 — End: 2015-06-29

## 2015-05-04 MED ORDER — DOCUSATE SODIUM 100 MG PO CAPS: 100.0000 mg | ORAL_CAPSULE | Freq: Two times a day (BID) | ORAL | Status: DC

## 2015-05-04 MED ORDER — OXYCODONE-ACETAMINOPHEN 10-325 MG PO TABS: 1.0000 | ORAL_TABLET | ORAL | Status: DC | PRN

## 2015-05-04 MED ORDER — POLYETHYLENE GLYCOL 3350 17 G PO PACK: 17.0000 g | PACK | Freq: Every day | ORAL | Status: DC

## 2015-05-04 NOTE — Progress Notes (Signed)
Patient ID: Frank Rosales, male   DOB: Dec 18, 1955, 59 y.o.   MRN: [id number]   LOS: 3 days   Subjective: Having some right parasternal pain, especially when taking big breaths. Otherwise about the same.   Objective: Vital signs in last 24 hours: Temp:  [97.7 F (36.5 C)-98.1 F (36.7 C)] 98.1 F (36.7 C) (12/21 0540) Pulse Rate:  [85-97] 87 (12/21 0540) Resp:  [19] 19 (12/21 0540) BP: (113)/(75-86) 113/86 mmHg (12/21 0540) SpO2:  [93 %-97 %] 97 % (12/21 0540) Last BM Date: 04/30/15   Physical Exam General appearance: alert and no distress Resp: clear to auscultation bilaterally Cardio: regular rate and rhythm   Assessment/Plan: Fall Multiple right rib fxs w/PTX -- Pulmonary toilet Multiple medical problems -- Home meds. Dr. Melvyn Novas is his pulmonologist. FEN -- I think pain regimen is good. Will get resting/ambulatory O2 sats, may need O2 to go home. VTE -- SCD's, Lovenox Dispo -- Home today +/- O2    Lisette Abu, PA-C Pager: [phone] General Trauma PA Pager: [phone]  05/04/2015

## 2015-05-04 NOTE — Clinical Social Work Note (Signed)
Clinical Social Worker will sign off for now as social work intervention is no longer needed. Please consult us again if new need arises.  Jesse Nickoli Bagheri, LCSW [phone]  

## 2015-05-04 NOTE — Discharge Summary (Signed)
Physician Discharge Summary  Patient ID: Frank Rosales MRN: [id number] DOB/AGE: Apr 23, 1956 59 y.o.  Admit date: 05/01/2015 Discharge date: 05/04/2015  Discharge Diagnoses Patient Active Problem List   Diagnosis Date Noted  . Fall 05/02/2015  . Multiple fractures of ribs of right side 05/02/2015  . Traumatic hemopneumothorax 05/02/2015  . Pneumothorax on right 05/01/2015  . Alcohol abuse 10/19/2014  . GAD (generalized anxiety disorder) 07/01/2014  . Hypothyroidism 12/03/2013  . Osteopenia 03/11/2013  . Cigarette smoker 07/12/2011  . High blood pressure   . COPD GOLD III/ still smoking    . High cholesterol   . Hilar adenopathy     Consultants None   Procedures None   HPI: [name] fell the night prior to admission after he slipped on something that was on the bathroom floor and hit with his right side against the tub. He complained of right-sided pain that was worse with movement and deep breaths. He went to urgent care where he obtain an x-ray that was thought to have multiple rib fractures on that side. There was some question from the urgent care physician if there was a pneumothorax and he was transferred to [facility] for further evaluation. He received another chest x-ray here that showed resolution of the pneumothorax but he was admitted by the trauma service for pain control and pulmonary toilet.   Hospital Course: A repeat chest x-ray the following day was stable. He was mobilized with physical therapy and was able to do well once his pain was brought under control. It took multiple titrations with the addition of adjunctive medications to achieve adequate control. He did not suffer any respiratory compromise while here. He was discharged home in good condition.     Medication List    TAKE these medications        albuterol 108 (90 BASE) MCG/ACT inhaler  Commonly known as:  PROAIR HFA  Inhale 2 puffs into the lungs every 6 (six) hours as needed for wheezing or  shortness of breath.     ALPRAZolam 0.5 MG tablet  Commonly known as:  XANAX  Take 1 tablet (0.5 mg total) by mouth 3 (three) times daily as needed for anxiety.     aspirin 81 MG tablet  Take 81 mg by mouth daily. Hold's medication prior to spinal injections     budesonide-formoterol 160-4.5 MCG/ACT inhaler  Commonly known as:  SYMBICORT  Take 2 puffs first thing in am and then another 2 puffs about 12 hours later.     cholecalciferol 1000 UNITS tablet  Commonly known as:  VITAMIN D  Take 1,000 Units by mouth daily.     docusate sodium 100 MG capsule  Commonly known as:  COLACE  Take 1 capsule (100 mg total) by mouth 2 (two) times daily.     DULoxetine 30 MG capsule  Commonly known as:  CYMBALTA  Take 3 capsules (90 mg total) by mouth daily.     famotidine 20 MG tablet  Commonly known as:  PEPCID  One at bedtime     folic acid 1 MG tablet  Commonly known as:  FOLVITE  Take 1 tablet (1 mg total) by mouth daily.     levothyroxine 50 MCG tablet  Commonly known as:  SYNTHROID, LEVOTHROID  Take 1 tablet (50 mcg total) by mouth daily before breakfast. Wait 1 hour after taking before eating or drinking anything but water.     magic mouthwash Soln  Take 10 mLs by mouth 4 (four)  times daily as needed for mouth pain (after using inhaler).     meloxicam 15 MG tablet  Commonly known as:  MOBIC  Take 1 tablet (15 mg total) by mouth daily.     methocarbamol 500 MG tablet  Commonly known as:  ROBAXIN  Take 2 tablets (1,000 mg total) by mouth 4 (four) times daily.     NIFEdipine 90 MG 24 hr tablet  Commonly known as:  PROCARDIA XL/ADALAT-CC  TAKE 1 TABLET (90 MG TOTAL) BY MOUTH DAILY.     oxyCODONE 10 mg 12 hr tablet  Commonly known as:  OXYCONTIN  Take 1 tablet (10 mg total) by mouth every 12 (twelve) hours.     oxyCODONE-acetaminophen 10-325 MG tablet  Commonly known as:  PERCOCET  Take 1-2 tablets by mouth every 4 (four) hours as needed for pain.     pantoprazole 40 MG  tablet  Commonly known as:  PROTONIX  Take 1 tablet (40 mg total) by mouth daily. Take 30-60 min before first meal of the day     polyethylene glycol packet  Commonly known as:  MIRALAX / GLYCOLAX  Take 17 g by mouth daily.     risedronate 150 MG tablet  Commonly known as:  ACTONEL  Take 1 tablet (150 mg total) by mouth every 30 (thirty) days. with water, on an empty stomach, take nothing by mouth and to not lie down for 30 minutes after each dose.     traMADol 50 MG tablet  Commonly known as:  ULTRAM  Take 2 tablets (100 mg total) by mouth every 6 (six) hours.     valsartan-hydrochlorothiazide 320-25 MG tablet  Commonly known as:  DIOVAN-HCT  TAKE 1 TABLET BY MOUTH DAILY.     vitamin B-12 250 MCG tablet  Commonly known as:  CYANOCOBALAMIN  Take 250 mcg by mouth daily.            Follow-up Information    Call [facility].   Why:  As needed   Contact information:   [id number] [place] [phone]       Signed: Lisette Abu, PA-C Pager: [phone] General Trauma PA Pager: [phone] 05/04/2015, 4:24 PM

## 2015-05-04 NOTE — Discharge Instructions (Signed)
No driving while taking oxycodone. °

## 2015-05-04 NOTE — Progress Notes (Signed)
C  DC instructions verbally understood by pt.. No questions asked

## 2015-05-12 ENCOUNTER — Ambulatory Visit (INDEPENDENT_AMBULATORY_CARE_PROVIDER_SITE_OTHER): Payer: 59 | Admitting: Family Medicine

## 2015-05-12 ENCOUNTER — Encounter: Payer: Self-pay | Admitting: Family Medicine

## 2015-05-12 ENCOUNTER — Ambulatory Visit (INDEPENDENT_AMBULATORY_CARE_PROVIDER_SITE_OTHER): Payer: 59

## 2015-05-12 VITALS — BP 106/71 | HR 85 | Temp 97.5°F | Ht 69.0 in | Wt 184.0 lb

## 2015-05-12 DIAGNOSIS — I10 Essential (primary) hypertension: Secondary | ICD-10-CM

## 2015-05-12 DIAGNOSIS — J449 Chronic obstructive pulmonary disease, unspecified: Secondary | ICD-10-CM | POA: Diagnosis not present

## 2015-05-12 DIAGNOSIS — J939 Pneumothorax, unspecified: Secondary | ICD-10-CM

## 2015-05-12 DIAGNOSIS — S2241XA Multiple fractures of ribs, right side, initial encounter for closed fracture: Secondary | ICD-10-CM | POA: Diagnosis not present

## 2015-05-12 DIAGNOSIS — Z720 Tobacco use: Secondary | ICD-10-CM

## 2015-05-12 DIAGNOSIS — S270XXA Traumatic pneumothorax, initial encounter: Secondary | ICD-10-CM

## 2015-05-12 DIAGNOSIS — S272XXA Traumatic hemopneumothorax, initial encounter: Secondary | ICD-10-CM

## 2015-05-12 DIAGNOSIS — F17210 Nicotine dependence, cigarettes, uncomplicated: Secondary | ICD-10-CM

## 2015-05-12 NOTE — Progress Notes (Signed)
Subjective:  Patient ID: Frank Rosales, male    DOB: Sep 11, 1955  Age: 59 y.o. MRN: [id number]  CC: Hospitalization Follow-up   HPI EUGUNE LOCKNER presents for follow-up of his rib fracture. He was admitted to the hospital after a fall in the bathtub on December 18th. He fractured the ribs and that subsequently caused his right lung to collapse. Complicating this is his pre-existing COPD and status as an active smoker. He states that he quit for a little while but has resumed smoking. Currently he has a lot of pain in the side of the rib fractures on his lower right anterolateral ribs. He has baseline shortness of breath. He says it's difficult to take a deep breath.  History Colum has a past medical history of High blood pressure; Asthma; High cholesterol; Hilar adenopathy; COPD (chronic obstructive pulmonary disease) ([place]); Anxiety; Colon polyps; Hypothyroidism; and Arthritis.   He has past surgical history that includes Knee arthroscopy (Right, 05/14/2010); Wisdom tooth extraction; epidural injections; Fixation kyphoplasty lumbar spine; and Back surgery.   His family history includes Emphysema in his father; Heart disease in his brother; Heart failure in his mother; Lung cancer in his father.He reports that he quit smoking about 3 months ago. His smoking use included Cigarettes. He started smoking about 42 years ago. He has a 70 pack-year smoking history. He has never used smokeless tobacco. He reports that he drinks about 25.2 oz of alcohol per week. He reports that he does not use illicit drugs.  Outpatient Prescriptions Prior to Visit  Medication Sig Dispense Refill  . albuterol (PROAIR HFA) 108 (90 BASE) MCG/ACT inhaler Inhale 2 puffs into the lungs every 6 (six) hours as needed for wheezing or shortness of breath. 8 g 11  . ALPRAZolam (XANAX) 0.5 MG tablet Take 1 tablet (0.5 mg total) by mouth 3 (three) times daily as needed for anxiety. 90 tablet 5  . aspirin 81 MG tablet Take 81 mg  by mouth daily. Hold's medication prior to spinal injections    . budesonide-formoterol (SYMBICORT) 160-4.5 MCG/ACT inhaler Take 2 puffs first thing in am and then another 2 puffs about 12 hours later. 3 Inhaler 0  . cholecalciferol (VITAMIN D) 1000 UNITS tablet Take 1,000 Units by mouth daily.     Marland Kitchen docusate sodium (COLACE) 100 MG capsule Take 1 capsule (100 mg total) by mouth 2 (two) times daily.    . DULoxetine (CYMBALTA) 30 MG capsule Take 3 capsules (90 mg total) by mouth daily. 90 capsule 1  . famotidine (PEPCID) 20 MG tablet One at bedtime 30 tablet 2  . folic acid (FOLVITE) 1 MG tablet Take 1 tablet (1 mg total) by mouth daily. 90 tablet 3  . levothyroxine (SYNTHROID, LEVOTHROID) 50 MCG tablet Take 1 tablet (50 mcg total) by mouth daily before breakfast. Wait 1 hour after taking before eating or drinking anything but water. 90 tablet 3  . magic mouthwash SOLN Take 10 mLs by mouth 4 (four) times daily as needed for mouth pain (after using inhaler). 240 mL 11  . meloxicam (MOBIC) 15 MG tablet Take 1 tablet (15 mg total) by mouth daily. (Patient taking differently: Take 15 mg by mouth daily as needed. ) 30 tablet 5  . methocarbamol (ROBAXIN) 500 MG tablet Take 2 tablets (1,000 mg total) by mouth 4 (four) times daily. 100 tablet 0  . NIFEdipine (PROCARDIA XL/ADALAT-CC) 90 MG 24 hr tablet TAKE 1 TABLET (90 MG TOTAL) BY MOUTH DAILY. 90 tablet 0  .  oxyCODONE (OXYCONTIN) 10 mg 12 hr tablet Take 1 tablet (10 mg total) by mouth every 12 (twelve) hours. 15 tablet 0  . oxyCODONE-acetaminophen (PERCOCET) 10-325 MG tablet Take 1-2 tablets by mouth every 4 (four) hours as needed for pain. 60 tablet 0  . pantoprazole (PROTONIX) 40 MG tablet Take 1 tablet (40 mg total) by mouth daily. Take 30-60 min before first meal of the day 30 tablet 2  . polyethylene glycol (MIRALAX / GLYCOLAX) packet Take 17 g by mouth daily.    . traMADol (ULTRAM) 50 MG tablet Take 2 tablets (100 mg total) by mouth every 6 (six) hours.  100 tablet 0  . valsartan-hydrochlorothiazide (DIOVAN-HCT) 320-25 MG per tablet TAKE 1 TABLET BY MOUTH DAILY. 90 tablet 1  . vitamin B-12 (CYANOCOBALAMIN) 250 MCG tablet Take 250 mcg by mouth daily.    . risedronate (ACTONEL) 150 MG tablet Take 1 tablet (150 mg total) by mouth every 30 (thirty) days. with water, on an empty stomach, take nothing by mouth and to not lie down for 30 minutes after each dose. (Patient not taking: Reported on 05/12/2015) 3 tablet 3   No facility-administered medications prior to visit.    ROS Review of Systems  Constitutional: Negative for fever, chills, diaphoresis and unexpected weight change.  HENT: Negative for congestion, hearing loss, rhinorrhea and sore throat.   Eyes: Negative for visual disturbance.  Respiratory: Positive for chest tightness and shortness of breath. Negative for cough.   Cardiovascular: Negative.   Gastrointestinal: Negative for abdominal pain, diarrhea and constipation.  Genitourinary: Negative for dysuria and flank pain.  Musculoskeletal: Positive for arthralgias. Negative for joint swelling.  Skin: Negative for rash.  Neurological: Negative for dizziness and headaches.  Psychiatric/Behavioral: Negative for sleep disturbance and dysphoric mood.    Objective:  BP 106/71 mmHg  Pulse 85  Temp(Src) 97.5 F (36.4 C) (Oral)  Ht 5\' 9"  (1.753 m)  Wt 184 lb (83.462 kg)  BMI 27.16 kg/m2  SpO2 95%  BP Readings from Last 3 Encounters:  05/12/15 106/71  05/04/15 104/75  04/06/15 97/74    Wt Readings from Last 3 Encounters:  05/12/15 184 lb (83.462 kg)  05/01/15 184 lb 6.4 oz (83.643 kg)  04/06/15 190 lb 12.8 oz (86.546 kg)     Physical Exam  Constitutional: He is oriented to person, place, and time. He appears well-developed and well-nourished. No distress.  HENT:  Head: Normocephalic and atraumatic.  Right Ear: External ear normal.  Left Ear: External ear normal.  Nose: Nose normal.  Mouth/Throat: Oropharynx is clear  and moist.  Eyes: Conjunctivae and EOM are normal. Pupils are equal, round, and reactive to light.  Neck: Normal range of motion. Neck supple. No thyromegaly present.  Cardiovascular: Normal rate, regular rhythm and normal heart sounds.   No murmur heard. Pulmonary/Chest: Effort normal and breath sounds normal. No respiratory distress. He has no wheezes. He has no rales.  Abdominal: Soft. Bowel sounds are normal. He exhibits no distension. There is no tenderness.  Musculoskeletal: He exhibits tenderness (right lower chest wall for palpation and compression.).  Lymphadenopathy:    He has no cervical adenopathy.  Neurological: He is alert and oriented to person, place, and time. He has normal reflexes.  Skin: Skin is warm and dry.  Psychiatric: He has a normal mood and affect. His behavior is normal. Judgment and thought content normal.     Lab Results  Component Value Date   WBC 10.5 05/01/2015   HGB 14.5 05/01/2015  HCT 40.4 05/01/2015   PLT 186 05/01/2015   GLUCOSE 128* 04/06/2015   CHOL 225* 02/04/2015   TRIG 72 02/04/2015   HDL 97 02/04/2015   LDLCALC 114* 02/04/2015   ALT 24 04/06/2015   AST 22 04/06/2015   NA 136 04/06/2015   K 4.4 04/06/2015   CL 94* 04/06/2015   CREATININE 0.74 05/01/2015   BUN 10 04/06/2015   CO2 26 04/06/2015   TSH 1.780 01/28/2015   PSA 0.9 02/06/2013   INR 0.98 10/20/2010   HGBA1C 5.2 04/11/2015    Dg Chest 2 View  05/01/2015  CLINICAL DATA:  Fall. EXAM: CHEST  2 VIEW COMPARISON:  05/01/2015 FINDINGS: Normal heart size. No pleural effusion identified. The previously noted tiny right apical pneumothorax is not visualized. There is atelectasis in the right base with adjacent rib fractures. Subcutaneous emphysema within right lateral chest wall and right supraclavicular region is again noted. IMPRESSION: 1. No change in right chest wall subcutaneous emphysema. 2. Small right apical pneumothorax is no longer visualized. 3. Right base atelectasis.  Electronically Signed   By: Kerby Moors M.D.   On: 05/01/2015 16:07   Dg Chest Port 1 View  05/02/2015  CLINICAL DATA:  Evaluate right-sided pneumothorax. EXAM: PORTABLE CHEST 1 VIEW COMPARISON:  05/01/2015; 10/27/2014 FINDINGS: Grossly unchanged cardiac silhouette and mediastinal contours given persistently reduced lung volumes. Grossly unchanged bilateral infrahilar opacities, right greater than left. No new focal airspace opacities. Unchanged trace of sided effusion. The amount of right lateral chest wall subcutaneous emphysema has minimally decreased in the interval. No pneumothorax. Known minimally displaced right-sided posterior lateral rib fractures suboptimally evaluated. IMPRESSION: 1. The amount of right lateral chest wall subcutaneous emphysema is minimally decreased in the interval. No pneumothorax. 2. Grossly unchanged findings of hypoventilation and bilateral infrahilar opacities, likely atelectasis. Electronically Signed   By: Sandi Mariscal M.D.   On: 05/02/2015 07:37    Assessment & Plan:   Iason was seen today for hospitalization follow-up.  Diagnoses and all orders for this visit:  Pneumothorax, traumatic, initial encounter -     DG Chest 2 View; Future  COPD GOLD III/ still smoking   Essential hypertension  Cigarette smoker  Pneumothorax on right  Multiple fractures of ribs of right side, closed, initial encounter  Traumatic hemopneumothorax, initial encounter   I am having Mr. Choudhary maintain his aspirin, cholecalciferol, vitamin 0000000, folic acid, meloxicam, albuterol, valsartan-hydrochlorothiazide, famotidine, pantoprazole, ALPRAZolam, levothyroxine, magic mouthwash, budesonide-formoterol, DULoxetine, risedronate, NIFEdipine, docusate sodium, methocarbamol, oxyCODONE, polyethylene glycol, traMADol, oxyCODONE-acetaminophen, nystatin, and traZODone.  Meds ordered this encounter  Medications  . nystatin (MYCOSTATIN) 100000 UNIT/ML suspension    Sig: Reported on  05/12/2015  . traZODone (DESYREL) 150 MG tablet    Sig:    Chest x-ray confirms rib fractures. No sign of pneumothorax noted.  Follow-up: Return in about 1 month (around 06/12/2015) for Pain, COPD.  Claretta Fraise, M.D.

## 2015-05-24 ENCOUNTER — Ambulatory Visit: Payer: 59 | Admitting: Internal Medicine

## 2015-05-27 ENCOUNTER — Other Ambulatory Visit: Payer: Self-pay | Admitting: *Deleted

## 2015-05-27 DIAGNOSIS — F411 Generalized anxiety disorder: Secondary | ICD-10-CM

## 2015-05-27 DIAGNOSIS — M48061 Spinal stenosis, lumbar region without neurogenic claudication: Secondary | ICD-10-CM

## 2015-05-27 MED ORDER — DULOXETINE HCL 30 MG PO CPEP: 90.0000 mg | ORAL_CAPSULE | Freq: Every day | ORAL | Status: DC

## 2015-06-02 ENCOUNTER — Encounter: Payer: Self-pay | Admitting: Family Medicine

## 2015-06-02 ENCOUNTER — Ambulatory Visit (INDEPENDENT_AMBULATORY_CARE_PROVIDER_SITE_OTHER): Payer: 59 | Admitting: Family Medicine

## 2015-06-02 VITALS — BP 118/78 | HR 113 | Temp 97.5°F | Ht 69.0 in | Wt 184.8 lb

## 2015-06-02 DIAGNOSIS — R1084 Generalized abdominal pain: Secondary | ICD-10-CM | POA: Diagnosis not present

## 2015-06-02 NOTE — Progress Notes (Signed)
   HPI  Patient presents today for generalized abdominal pain, nausea, diarrhea.  Patient explains that over the last 5 days or so he's had generalized abdominal pain, nausea, and diarrhea. He's only had 2 episodes of discrete diarrhea, he describes it as foul-smelling and dark colored but not black. He has had no blood in his stool. He has not had any vomiting but he has had persistent nausea. Last night he tolerated food without any problems, he has not eaten today.  He was treated with Percocet for about 2 weeks, using 4-6 pills per day, he's been off this for about 7 days.  His wife was sick with a similar illness about a week and a half or 2 weeks ago.  He drinks approximately 6 beers per day, he had been cutting back but then again yesterday started drinking with mild improvement in symptoms.  PMH: Smoking status noted ROS: Per HPI  Objective: BP 118/78 mmHg  Pulse 113  Temp(Src) 97.5 F (36.4 C) (Oral)  Ht '5\' 9"'$  (1.753 m)  Wt 184 lb 12.8 oz (83.825 kg)  BMI 27.28 kg/m2 Gen: NAD, alert, cooperative with exam HEENT: NCAT CV: RRR, good S1/S2, no murmur Resp: CTABL, no wheezes, non-labored Abd: Soft, positive bowel sounds, mild tenderness in the epigastric area Ext: No edema, warm Neuro: Alert and oriented, No gross deficits  Assessment and plan:  # Abdominal pain, nausea, diarrhea Multifactorial most likely, some diarrhea possibly and pain due to coming off of Percocet, also his wife had a recent GI viral illness which I think he probably has shared in. Labs today to rule out pancreatitis, looking at the white count, and for signs of liver stress Discussed reasons for return and reasons to seek emergency medical care.    Orders Placed This Encounter  Procedures  . Lipase  . CMP14+EGFR  . CBC with Differential    No orders of the defined types were placed in this encounter.    Laroy Apple, MD [facility] 06/02/2015, 9:28  AM

## 2015-06-02 NOTE — Patient Instructions (Signed)
Great to meet you!  I think you have several things happening in your abdomen at once, Its likely you had the same stomach bug and just coming off of percocet is not helping.   We will call with lab results within 1 week  Come back in 2 weeks for follow up  Abdominal Pain, Adult Many things can cause abdominal pain. Usually, abdominal pain is not caused by a disease and will improve without treatment. It can often be observed and treated at home. Your health care provider will do a physical exam and possibly order blood tests and X-rays to help determine the seriousness of your pain. However, in many cases, more time must pass before a clear cause of the pain can be found. Before that point, your health care provider may not know if you need more testing or further treatment. HOME CARE INSTRUCTIONS Monitor your abdominal pain for any changes. The following actions may help to alleviate any discomfort you are experiencing:  Only take over-the-counter or prescription medicines as directed by your health care provider.  Do not take laxatives unless directed to do so by your health care provider.  Try a clear liquid diet (broth, tea, or water) as directed by your health care provider. Slowly move to a bland diet as tolerated. SEEK MEDICAL CARE IF:  You have unexplained abdominal pain.  You have abdominal pain associated with nausea or diarrhea.  You have pain when you urinate or have a bowel movement.  You experience abdominal pain that wakes you in the night.  You have abdominal pain that is worsened or improved by eating food.  You have abdominal pain that is worsened with eating fatty foods.  You have a fever. SEEK IMMEDIATE MEDICAL CARE IF:  Your pain does not go away within 2 hours.  You keep throwing up (vomiting).  Your pain is felt only in portions of the abdomen, such as the right side or the left lower portion of the abdomen.  You pass bloody or black tarry  stools. MAKE SURE YOU:  Understand these instructions.  Will watch your condition.  Will get help right away if you are not doing well or get worse.   This information is not intended to replace advice given to you by your health care provider. Make sure you discuss any questions you have with your health care provider.   Document Released: 02/07/2005 Document Revised: 01/19/2015 Document Reviewed: 01/07/2013 Elsevier Interactive Patient Education [employer].

## 2015-06-03 LAB — CMP14+EGFR
ALT: 25 IU/L (ref 0–44)
AST: 22 IU/L (ref 0–40)
Albumin/Globulin Ratio: 1.6 (ref 1.1–2.5)
Albumin: 4.4 g/dL (ref 3.5–5.5)
Alkaline Phosphatase: 124 IU/L — ABNORMAL HIGH (ref 39–117)
BILIRUBIN TOTAL: 0.6 mg/dL (ref 0.0–1.2)
BUN/Creatinine Ratio: 8 — ABNORMAL LOW (ref 9–20)
BUN: 8 mg/dL (ref 6–24)
CHLORIDE: 93 mmol/L — AB (ref 96–106)
CO2: 23 mmol/L (ref 18–29)
Calcium: 9.5 mg/dL (ref 8.7–10.2)
Creatinine, Ser: 1.02 mg/dL (ref 0.76–1.27)
GFR calc non Af Amer: 81 mL/min/{1.73_m2} (ref >59)
GFR, EST AFRICAN AMERICAN: 94 mL/min/{1.73_m2} (ref >59)
GLUCOSE: 139 mg/dL — AB (ref 65–99)
Globulin, Total: 2.7 g/dL (ref 1.5–4.5)
Potassium: 3.9 mmol/L (ref 3.5–5.2)
Sodium: 135 mmol/L (ref 134–144)
TOTAL PROTEIN: 7.1 g/dL (ref 6.0–8.5)

## 2015-06-03 LAB — CBC WITH DIFFERENTIAL/PLATELET
BASOS ABS: 0 10*3/uL (ref 0.0–0.2)
Basos: 0 %
EOS (ABSOLUTE): 0.2 10*3/uL (ref 0.0–0.4)
Eos: 2 %
HEMOGLOBIN: 16.8 g/dL (ref 12.6–17.7)
Hematocrit: 48.6 % (ref 37.5–51.0)
IMMATURE GRANS (ABS): 0 10*3/uL (ref 0.0–0.1)
Immature Granulocytes: 0 %
LYMPHS: 16 %
Lymphocytes Absolute: 1.3 10*3/uL (ref 0.7–3.1)
MCH: 34.9 pg — ABNORMAL HIGH (ref 26.6–33.0)
MCHC: 34.6 g/dL (ref 31.5–35.7)
MCV: 101 fL — ABNORMAL HIGH (ref 79–97)
MONOCYTES: 9 %
Monocytes Absolute: 0.7 10*3/uL (ref 0.1–0.9)
NEUTROS ABS: 5.9 10*3/uL (ref 1.4–7.0)
Neutrophils: 73 %
Platelets: 238 10*3/uL (ref 150–379)
RBC: 4.82 x10E6/uL (ref 4.14–5.80)
RDW: 15.1 % (ref 12.3–15.4)
WBC: 8.1 10*3/uL (ref 3.4–10.8)

## 2015-06-03 LAB — LIPASE: Lipase: 37 U/L (ref 0–59)

## 2015-06-16 ENCOUNTER — Encounter: Payer: Self-pay | Admitting: Internal Medicine

## 2015-06-16 ENCOUNTER — Ambulatory Visit (INDEPENDENT_AMBULATORY_CARE_PROVIDER_SITE_OTHER): Payer: 59 | Admitting: Internal Medicine

## 2015-06-16 VITALS — BP 150/96 | HR 101 | Ht 70.0 in | Wt 186.0 lb

## 2015-06-16 DIAGNOSIS — I10 Essential (primary) hypertension: Secondary | ICD-10-CM

## 2015-06-16 DIAGNOSIS — J449 Chronic obstructive pulmonary disease, unspecified: Secondary | ICD-10-CM | POA: Diagnosis not present

## 2015-06-16 NOTE — Progress Notes (Signed)
Subjective:    Patient ID: Frank Rosales, male    DOB: Jan 24, 1956    MRN: [id number]    Brief patient profile:  66 yowm quit smoking 02/12/15  still able to do flat fine but steps x 3 flights sob with GOLD III copd 2013 prev eval by Dr Joya Gaskins  s/p admit  Admit date: 10/18/2014 Discharge date: 10/21/2014   Discharge Diagnoses:  Acute COPD exacerbation Improving slowly. Continue treatment with steroids, antibiotics, nebulizer treatments.  -10/20/2014--clinically stable on room air -10/20/14--De-escalate to oral steroids -home with prednisone 50mg  daily and decrease by 10mg  daily -Patient has significant occupational exposures that may contribute to chronic lung disease. -I have advised the patient to follow-up with his pulmonologist after he is clinically stable in the outpatient setting. -Ambulatory pulse oximetry did not reveal any desaturation  Left lower lobe pneumonia/community-acquired Continue antibiotics as mentioned above. WBC was normal. -afebrile and hemodynamically stable -home with 4 more days of levofloxaxin 500mg  to complete 7 days of therapy  Syncope Likely vasovagal secondary to coughing spells. Monitor on telemetry. EKG shows sinus rhythm with nonspecific T-wave changes. Nothing acutely ischemic. Patient denies any chest pains. -Telemetry without any concerning dysrhythmia -10/20/2014 echocardiogram shows EF 60%, no WMA, normal RV   11/10/2014  Post hosp f/u/transition of care/ extended ov/establish  Frank Rosales  / advair maint rx / still actively smoking  Chief Complaint  Patient presents with  . Follow-up    Pt states had PNA recently and had to be admitted to hospital from 10/18/14 until 10/21/14. He feels that he is improving and would like to return to work with no restrictions.  He has occ cough with minimal green. He states he only gets SOB walking up 2 flights up stairs.    Ok flat at nl pace, some cough on exp to dust /fumes on advair but no noct events/ using saba at  least twice daily  rec Stop advair  Symbicort 160 Take 2 puffs first thing in am and then another 2 puffs about 12 hours later - clal me if any problem getting it for free  Work on perfecting inhaler technique:    Only use your albuterol (proair) as needed     01/24/2015 f/u ov/Frank Rosales re: GOLD III copd with reversibility/ still smoking  Chief Complaint  Patient presents with  . Follow-up    PFT done today. Pt states that his breathing is unchanged. He rarely uses proair. He is needing pulmonary clearance for back surgery by Dr Rita Ohara.   Cough so hard passed out and hurt back - much worse cough in hot smoky dusty conditions and much worse at work were all of the above apply plus fume exposure.  rec The key is to stop smoking completely before smoking completely stops you!  I do not think you should work in a hot/dusty  Environment especially  with fume exposure  I recommend you seek permanent disability at this point if you can't work in a clean/ cool environment  Work on inhaler technique:    For cough> mucinex DM 1200 mg per day supplement with ultram (tramadol) up to 2 every 4 hours until no longer coughing  Pantoprazole (protonix) 40 mg   Take  30-60 min before first meal of the day and Pepcid (famotidine)  20 mg one @  bedtime until return to office - this is the best way to tell whether stomach acid is contributing to your problem.   GERD diet  Please schedule a follow  up office visit in 4 weeks, sooner if needed  Late add:  Advised cleared for surgery / copy to [facility] > deferred by [facility]    02/21/2015  f/u ov/Frank Rosales re: GOLD III / quit smoking around 02/12/15   Chief Complaint  Patient presents with  . Follow-up    Pt states his breathing seems to be improving and he is not coughing as much. He has only used albuterol x 1 since his last visit.   A little coughing each am / not using symbicort immediately in am as rec / no more presyncope/ no back pain with coughing  rec No  change rx   06/16/2015  f/u ov/Frank Rosales re: GOLD III copd/ symbicort 160 2bid / once a week need saba  Chief Complaint  Patient presents with  . Follow-up    Pt states his breathing is overall doing well. He fell mid Dec and fractured 4 ribs. He has occ cough with clear to light green sputum.   no am cough flares  Not limited by breathing from desired activities  But very sedentary due to back / leg pain    No obvious day to day or daytime variabilty or assoc  cp or chest tightness, subjective wheeze overt sinus or hb symptoms. No unusual exp hx or h/o childhood pna/ asthma or knowledge of premature birth.  Sleeping ok without nocturnal  or early am exacerbation  of respiratory  c/o's or need for noct saba. Also denies any obvious fluctuation of symptoms with weather or environmental changes or other aggravating or alleviating factors except as outlined above   Current Medications, Allergies, Complete Past Medical History, Past Surgical History, Family History, and Social History were reviewed in [employer] record.  ROS  The following are not active complaints unless bolded sore throat, dysphagia, dental problems, itching, sneezing,  nasal congestion or excess/ purulent secretions, ear ache,   fever, chills, sweats, unintended wt loss, pleuritic or exertional cp, hemoptysis,  orthopnea pnd or leg swelling, presyncope, palpitations, abdominal pain, anorexia, nausea, vomiting, diarrhea  or change in bowel or urinary habits, change in stools or urine, dysuria,hematuria,  rash, arthralgias, visual complaints, headache, numbness down R leg with no change back pain  weakness or ataxia or problems with walking or coordination,  change in mood/affect or memory.              Objective:   Physical Exam  amb hoarse wm  / slt tremulous   01/24/2015        180 > 02/21/2015  180  > 06/16/2015  186      11/10/14 174 lb (78.926 kg)  11/08/14 176 lb 6.4 oz (80.015 kg)  10/27/14 175 lb  (79.379 kg)    Vital signs reviewed   Gen: Pleasant, well-nourished, in no distress,  normal affect  ENT: No lesions,  mouth clear,  oropharynx clear, no postnasal drip  Neck: No JVD, no TMG, no carotid bruits  Lungs: No use of accessory muscles, no dullness to percussion, distant BS,  Min insp and exp rhonchi mostly upper airway   Cardiovascular: RRR, heart sounds normal, no murmur or gallops, no peripheral edema  Abdomen: soft and NT, no HSM,  BS normal  Musculoskeletal: No deformities, no cyanosis or clubbing  Neuro: alert, non focal  Skin: Warm, no lesions or rashes    I personally reviewed images and agree with radiology impression as follows:  CXR:    05/12/15   Persistent small right pleural  effusion and overlying atelectasis. No pneumothorax.  Stable right rib fractures.    Assessment & Plan:

## 2015-06-16 NOTE — Patient Instructions (Signed)
Congratulations on not smoking/ it's the most important aspect of your care!   If you are satisfied with your treatment plan,  let your doctor know and he/she can either refill your medications or you can return here when your prescription runs out.     If in any way you are not 100% satisfied,  please tell us.  If 100% better, tell your friends!  Pulmonary follow up is as needed

## 2015-06-24 ENCOUNTER — Other Ambulatory Visit: Payer: Self-pay | Admitting: Family Medicine

## 2015-06-24 ENCOUNTER — Encounter: Payer: Self-pay | Admitting: Internal Medicine

## 2015-06-24 NOTE — Assessment & Plan Note (Addendum)
Spirometry 03/2011 FeV1 49%    Spirometry  02/28/2012  FEV1 45%   - Last day worked = October 13 2014  - PFT's  01/24/2015  FEV1 1.59  (43 % ) ratio 54   p 22 % improvement from saba with DLCO  88 % corrects to 99 % for alv volume s symbicort - Quit smoking 02/12/15  - 02/21/2015  extensive coaching HFA effectiveness =    90%   I had an extended final summary discussion with the patient reviewing all relevant studies completed to date and  lasting 15 to 20 minutes of a 25 minute visit on the following issues:   As I explained to this patient in detail:  although there is significant  copd present, it may not be clinically relevant:   it does not appear to be limiting activity tolerance any more than a set of worn tires limits someone from driving a car  around a parking lot.  A new set of Michelins might look good but would have no perceived impact on the performance of the car and would not be worth the cost.  That is to say:   this pt is so sedentary I don't recommend aggressive pulmonary rx at this point unless limiting symptoms arise or acute exacerbations become as issue, neither of which is the case now.  I asked the patient to contact this office at any time in the future should either of these problems arise.    Each maintenance medication was reviewed in detail including most importantly the difference between maintenance and as needed and under what circumstances the prns are to be used.  Please see instructions for details which were reviewed in writing and the patient given a copy.

## 2015-06-24 NOTE — Assessment & Plan Note (Signed)
Not consistently using bp meds first thing in am > suggested he do and Follow up per Primary Care planned

## 2015-06-28 ENCOUNTER — Telehealth: Payer: Self-pay | Admitting: Family Medicine

## 2015-06-29 ENCOUNTER — Other Ambulatory Visit: Payer: Self-pay

## 2015-06-29 MED ORDER — TRAMADOL HCL 50 MG PO TABS: 100.0000 mg | ORAL_TABLET | Freq: Four times a day (QID) | ORAL | Status: DC

## 2015-06-29 NOTE — Telephone Encounter (Signed)
Last seen 06/02/15 Dr Wendi Snipes   PCP is Dr Livia Snellen  If approved print

## 2015-07-06 ENCOUNTER — Ambulatory Visit (INDEPENDENT_AMBULATORY_CARE_PROVIDER_SITE_OTHER): Payer: 59

## 2015-07-06 ENCOUNTER — Other Ambulatory Visit: Payer: Self-pay | Admitting: Family Medicine

## 2015-07-06 ENCOUNTER — Encounter: Payer: Self-pay | Admitting: Family Medicine

## 2015-07-06 ENCOUNTER — Ambulatory Visit (INDEPENDENT_AMBULATORY_CARE_PROVIDER_SITE_OTHER): Payer: 59 | Admitting: Family Medicine

## 2015-07-06 ENCOUNTER — Ambulatory Visit: Payer: 59

## 2015-07-06 VITALS — BP 111/77 | HR 110 | Temp 97.7°F | Ht 70.0 in | Wt 190.4 lb

## 2015-07-06 DIAGNOSIS — Z720 Tobacco use: Secondary | ICD-10-CM

## 2015-07-06 DIAGNOSIS — L989 Disorder of the skin and subcutaneous tissue, unspecified: Secondary | ICD-10-CM | POA: Insufficient documentation

## 2015-07-06 DIAGNOSIS — E7800 Pure hypercholesterolemia, unspecified: Secondary | ICD-10-CM

## 2015-07-06 DIAGNOSIS — F411 Generalized anxiety disorder: Secondary | ICD-10-CM | POA: Diagnosis not present

## 2015-07-06 DIAGNOSIS — J449 Chronic obstructive pulmonary disease, unspecified: Secondary | ICD-10-CM | POA: Diagnosis not present

## 2015-07-06 DIAGNOSIS — I10 Essential (primary) hypertension: Secondary | ICD-10-CM | POA: Diagnosis not present

## 2015-07-06 DIAGNOSIS — S2241XD Multiple fractures of ribs, right side, subsequent encounter for fracture with routine healing: Secondary | ICD-10-CM

## 2015-07-06 DIAGNOSIS — G479 Sleep disorder, unspecified: Secondary | ICD-10-CM | POA: Diagnosis not present

## 2015-07-06 DIAGNOSIS — F17210 Nicotine dependence, cigarettes, uncomplicated: Secondary | ICD-10-CM

## 2015-07-06 MED ORDER — BUDESONIDE-FORMOTEROL FUMARATE 160-4.5 MCG/ACT IN AERO: INHALATION_SPRAY | RESPIRATORY_TRACT | Status: DC

## 2015-07-06 MED ORDER — MIRTAZAPINE 15 MG PO TABS: 15.0000 mg | ORAL_TABLET | Freq: Every day | ORAL | Status: DC

## 2015-07-06 MED ORDER — NIFEDIPINE ER OSMOTIC RELEASE 90 MG PO TB24: ORAL_TABLET | ORAL | Status: DC

## 2015-07-06 MED ORDER — ALBUTEROL SULFATE HFA 108 (90 BASE) MCG/ACT IN AERS: 2.0000 | INHALATION_SPRAY | Freq: Four times a day (QID) | RESPIRATORY_TRACT | Status: DC | PRN

## 2015-07-06 MED ORDER — MELOXICAM 15 MG PO TABS: 15.0000 mg | ORAL_TABLET | Freq: Every day | ORAL | Status: DC

## 2015-07-06 MED ORDER — TRAMADOL HCL 50 MG PO TABS: 100.0000 mg | ORAL_TABLET | Freq: Four times a day (QID) | ORAL | Status: DC

## 2015-07-06 MED ORDER — PREDNISONE 5 MG PO TABS: 5.0000 mg | ORAL_TABLET | Freq: Every day | ORAL | Status: DC

## 2015-07-06 NOTE — Progress Notes (Addendum)
Subjective:  Patient ID: Frank Rosales, male    DOB: 1955-10-11  Age: 60 y.o. MRN: [id number]  CC: COPD; Back Pain; Hypertension; and GAD   HPI Frank Rosales presents for Back pain 10/10 with ambulation greater than 5 min. Radiates down right leg. Worsening since onset several months ago. Neurosurgery offered surgery, but needs clearance due to COPD. Considering surgery. Concerned about the risk due to COPD, but had a "Glitch." Went back to smoking last month. Says he has quit again, but wife contradicts him. She feels he is depressed and anxious also. He says that his mood is affected by his inability to ambulate secondary to pain and also related to shortness of breath. At this point it's difficult for him to excess help at the shortness of breath is because pain prevents activity that might make him short of breath. He also has knee problems and those cannot be surgically corrected until the back surgery is completed. He was told that his back surgery could not be performed until he quit smoking and until he was cleared from a respiratory standpoint. He states pulmonology released him several weeks ago.  GAD 7 : Generalized Anxiety Score 07/06/2015 05/12/2015 04/06/2015  Nervous, Anxious, on Edge 2 1 1   Control/stop worrying 2 3 1   Worry too much - different things 2 3 1   Trouble relaxing 3 3 3   Restless 3 3 1   Easily annoyed or irritable 1 3 0  Afraid - awful might happen 3 2 0  Total GAD 7 Score 16 18 7   Anxiety Difficulty Not difficult at all Not difficult at all -     Depression screen [facility] 2/9 07/06/2015 05/12/2015 04/06/2015 03/07/2015 02/04/2015  Decreased Interest 0 0 0 1 0  Down, Depressed, Hopeless 1 1 1 1 1   PHQ - 2 Score 1 1 1 2 1   Altered sleeping - - - 0 -  Tired, decreased energy - - - 0 -  Change in appetite - - - 0 -  Feeling bad or failure about yourself  - - - 0 -  Trouble concentrating - - - 0 -  Moving slowly or fidgety/restless - - - 0 -  Suicidal thoughts -  - - 0 -  PHQ-9 Score - - - 2 -       History Donovin has a past medical history of High blood pressure; Asthma; High cholesterol; Hilar adenopathy; COPD (chronic obstructive pulmonary disease) ([place]); Anxiety; Colon polyps; Hypothyroidism; and Arthritis.   He has past surgical history that includes Knee arthroscopy (Right, 05/14/2010); Wisdom tooth extraction; epidural injections; Fixation kyphoplasty lumbar spine; and Back surgery.   His family history includes Emphysema in his father; Heart disease in his brother; Heart failure in his mother; Lung cancer in his father.He reports that he quit smoking about 4 months ago. His smoking use included Cigarettes. He started smoking about 42 years ago. He has a 70 pack-year smoking history. He has never used smokeless tobacco. He reports that he drinks about 25.2 oz of alcohol per week. He reports that he does not use illicit drugs.    ROS Review of Systems  Constitutional: Negative for fever, chills, diaphoresis and unexpected weight change.  HENT: Negative for congestion, hearing loss, rhinorrhea and sore throat.   Eyes: Negative for visual disturbance.  Respiratory: Positive for cough, shortness of breath and wheezing.   Cardiovascular: Negative for chest pain.  Gastrointestinal: Negative for abdominal pain, diarrhea and constipation.  Genitourinary: Negative for dysuria  and flank pain.  Musculoskeletal: Positive for back pain and arthralgias (right ribs still have occasional sharp pain.). Negative for joint swelling.  Skin: Positive for color change (face ruborous). Negative for rash.       Lesion left cheek for a few months now enlarging   Neurological: Negative for dizziness and headaches.  Psychiatric/Behavioral: Negative for sleep disturbance and dysphoric mood.    Objective:  BP 111/77 mmHg  Pulse 110  Temp(Src) 97.7 F (36.5 C) (Oral)  Ht 5\' 10"  (1.778 m)  Wt 190 lb 6.4 oz (86.365 kg)  BMI 27.32 kg/m2  SpO2 94%  BP Readings  from Last 3 Encounters:  07/06/15 111/77  06/16/15 150/96  06/02/15 118/78    Wt Readings from Last 3 Encounters:  07/06/15 190 lb 6.4 oz (86.365 kg)  06/16/15 186 lb (84.369 kg)  06/02/15 184 lb 12.8 oz (83.825 kg)     Physical Exam  Constitutional: He is oriented to person, place, and time. He appears well-developed and well-nourished. No distress.  HENT:  Head: Normocephalic and atraumatic.  Right Ear: External ear normal.  Left Ear: External ear normal.  Nose: Nose normal.  Mouth/Throat: Oropharynx is clear and moist.  Eyes: Conjunctivae and EOM are normal. Pupils are equal, round, and reactive to light.  Neck: Normal range of motion. Neck supple. No thyromegaly present.  Cardiovascular: Normal rate, regular rhythm and normal heart sounds.   No murmur heard. Pulmonary/Chest: Effort normal. No respiratory distress. He has wheezes (throughout).  Abdominal: Soft. Bowel sounds are normal. He exhibits no distension. There is no tenderness.  Musculoskeletal: He exhibits tenderness (right lower chest. Midline back).  Lymphadenopathy:    He has no cervical adenopathy.  Neurological: He is alert and oriented to person, place, and time. He has normal reflexes.  Skin: Skin is warm and dry.  Psychiatric: His behavior is normal. Judgment and thought content normal.     Lab Results  Component Value Date   WBC 8.1 06/02/2015   HGB 14.5 05/01/2015   HCT 48.6 06/02/2015   PLT 238 06/02/2015   GLUCOSE 139* 06/02/2015   CHOL 225* 02/04/2015   TRIG 72 02/04/2015   HDL 97 02/04/2015   LDLCALC 114* 02/04/2015   ALT 25 06/02/2015   AST 22 06/02/2015   NA 135 06/02/2015   K 3.9 06/02/2015   CL 93* 06/02/2015   CREATININE 1.02 06/02/2015   BUN 8 06/02/2015   CO2 23 06/02/2015   TSH 1.780 01/28/2015   PSA 0.9 02/06/2013   INR 0.98 10/20/2010   HGBA1C 5.2 04/11/2015    Dg Chest 2 View  05/01/2015  CLINICAL DATA:  Fall. EXAM: CHEST  2 VIEW COMPARISON:  05/01/2015 FINDINGS:  Normal heart size. No pleural effusion identified. The previously noted tiny right apical pneumothorax is not visualized. There is atelectasis in the right base with adjacent rib fractures. Subcutaneous emphysema within right lateral chest wall and right supraclavicular region is again noted. IMPRESSION: 1. No change in right chest wall subcutaneous emphysema. 2. Small right apical pneumothorax is no longer visualized. 3. Right base atelectasis. Electronically Signed   By: Kerby Moors M.D.   On: 05/01/2015 16:07   Dg Chest Port 1 View  05/02/2015  CLINICAL DATA:  Evaluate right-sided pneumothorax. EXAM: PORTABLE CHEST 1 VIEW COMPARISON:  05/01/2015; 10/27/2014 FINDINGS: Grossly unchanged cardiac silhouette and mediastinal contours given persistently reduced lung volumes. Grossly unchanged bilateral infrahilar opacities, right greater than left. No new focal airspace opacities. Unchanged trace of sided effusion.  The amount of right lateral chest wall subcutaneous emphysema has minimally decreased in the interval. No pneumothorax. Known minimally displaced right-sided posterior lateral rib fractures suboptimally evaluated. IMPRESSION: 1. The amount of right lateral chest wall subcutaneous emphysema is minimally decreased in the interval. No pneumothorax. 2. Grossly unchanged findings of hypoventilation and bilateral infrahilar opacities, likely atelectasis. Electronically Signed   By: Sandi Mariscal M.D.   On: 05/02/2015 07:37    Assessment & Plan:   Mainor was seen today for copd, back pain, hypertension and gad.  Diagnoses and all orders for this visit:  COPD GOLD III  -     DG Chest 2 View; Future -     PR EVAL OF BRONCHOSPASM -     predniSONE (DELTASONE) 5 MG tablet; Take 1 tablet (5 mg total) by mouth daily with breakfast. -     albuterol (PROAIR HFA) 108 (90 Base) MCG/ACT inhaler; Inhale 2 puffs into the lungs every 6 (six) hours as needed for wheezing or shortness of breath. -      budesonide-formoterol (SYMBICORT) 160-4.5 MCG/ACT inhaler; Take 2 puffs first thing in am and then another 2 puffs about 12 hours later.  Multiple fractures of ribs of right side, with routine healing, subsequent encounter -     DG Ribs Unilateral W/Chest Right; Future  Cigarette smoker  Sleep disturbance -     mirtazapine (REMERON) 15 MG tablet; Take 1 tablet (15 mg total) by mouth at bedtime.  Essential hypertension  GAD (generalized anxiety disorder) -     mirtazapine (REMERON) 15 MG tablet; Take 1 tablet (15 mg total) by mouth at bedtime.  Possible basal cell Ca left cheek -     Ambulatory referral to Dermatology  High cholesterol  Generalized anxiety disorder  Other orders -     traMADol (ULTRAM) 50 MG tablet; Take 2 tablets (100 mg total) by mouth every 6 (six) hours. -     meloxicam (MOBIC) 15 MG tablet; Take 1 tablet (15 mg total) by mouth daily. -     NIFEdipine (PROCARDIA XL/ADALAT-CC) 90 MG 24 hr tablet; TAKE 1 TABLET (90 MG TOTAL) BY MOUTH DAILY.    PFT pre & post neb with xopenex indicates severe obstruction. Minimal improvement only.   I have discontinued Mr. Helmes traZODone. I am also having him start on predniSONE and mirtazapine. Additionally, I am having him maintain his aspirin, cholecalciferol, vitamin 0000000, folic acid, ALPRAZolam, levothyroxine, methocarbamol, oxyCODONE, KRILL OIL PO, valsartan-hydrochlorothiazide, traMADol, albuterol, budesonide-formoterol, meloxicam, and NIFEdipine.  Meds ordered this encounter  Medications  . predniSONE (DELTASONE) 5 MG tablet    Sig: Take 1 tablet (5 mg total) by mouth daily with breakfast.    Dispense:  90 tablet    Refill:  1  . mirtazapine (REMERON) 15 MG tablet    Sig: Take 1 tablet (15 mg total) by mouth at bedtime.    Dispense:  30 tablet    Refill:  5  . traMADol (ULTRAM) 50 MG tablet    Sig: Take 2 tablets (100 mg total) by mouth every 6 (six) hours.    Dispense:  100 tablet    Refill:  5  . albuterol  (PROAIR HFA) 108 (90 Base) MCG/ACT inhaler    Sig: Inhale 2 puffs into the lungs every 6 (six) hours as needed for wheezing or shortness of breath.    Dispense:  8 g    Refill:  11  . budesonide-formoterol (SYMBICORT) 160-4.5 MCG/ACT inhaler    Sig: Take  2 puffs first thing in am and then another 2 puffs about 12 hours later.    Dispense:  3 Inhaler    Refill:  2  . meloxicam (MOBIC) 15 MG tablet    Sig: Take 1 tablet (15 mg total) by mouth daily.    Dispense:  30 tablet    Refill:  5  . NIFEdipine (PROCARDIA XL/ADALAT-CC) 90 MG 24 hr tablet    Sig: TAKE 1 TABLET (90 MG TOTAL) BY MOUTH DAILY.    Dispense:  90 tablet    Refill:  1   approximately 70 minutes was spent with this patient more than half of which was spent in counseling. This was with regard to the complexities of his respiratory condition and severe COPD plus his tendency to go back to smoking due to stress. Smoking in turn will prevent him from having respiratory clearance from surgery. This means he will continue without any quality of life due to lack of ability to ambulate and have 10/10 pain. These factors in turn increase his stress which again tends to tempt him to smoke. In order to break this cycle we discussed Chantix and his concerns about side effects. We discussed COPD treatments and the pros and cons of adding prednisone. He is concerned about weight gain primarily. Additionally we discussed the role of pain medication and benzodiazepines as contributing to his COPD. Currently he is maintained on tramadol and using alprazolam for anxiety. We discussed in detail the risk for respiratory depression particularly if he increased the dose but even at current doses there is some risk of respiratory depression in light of his sinuses. COPD.  Follow-up: Return in about 2 weeks (around 07/20/2015).  Claretta Fraise, M.D.

## 2015-07-28 ENCOUNTER — Other Ambulatory Visit: Payer: Self-pay | Admitting: *Deleted

## 2015-07-28 NOTE — Telephone Encounter (Signed)
Not on med list - trazodone 150 1 tab QHS #90 day  -- requested from [facility]  Last seen [facility] On 07/06/15  Set up to refill to [facility] if approved

## 2015-07-29 MED ORDER — TRAZODONE HCL 150 MG PO TABS: 150.0000 mg | ORAL_TABLET | Freq: Every day | ORAL | Status: DC

## 2015-08-01 ENCOUNTER — Telehealth: Payer: Self-pay | Admitting: Family Medicine

## 2015-08-02 ENCOUNTER — Other Ambulatory Visit: Payer: Self-pay | Admitting: Family Medicine

## 2015-08-02 MED ORDER — NIFEDIPINE ER OSMOTIC RELEASE 90 MG PO TB24: ORAL_TABLET | ORAL | Status: DC

## 2015-08-02 NOTE — Telephone Encounter (Signed)
Last seen 07/06/15  Dr Livia Snellen

## 2015-08-02 NOTE — Telephone Encounter (Signed)
done

## 2015-09-01 ENCOUNTER — Ambulatory Visit (INDEPENDENT_AMBULATORY_CARE_PROVIDER_SITE_OTHER): Payer: 59 | Admitting: Family Medicine

## 2015-09-01 ENCOUNTER — Encounter: Payer: Self-pay | Admitting: Family Medicine

## 2015-09-01 VITALS — BP 135/86 | HR 92 | Temp 97.2°F | Ht 70.0 in | Wt 191.8 lb

## 2015-09-01 DIAGNOSIS — F411 Generalized anxiety disorder: Secondary | ICD-10-CM

## 2015-09-01 DIAGNOSIS — J449 Chronic obstructive pulmonary disease, unspecified: Secondary | ICD-10-CM | POA: Diagnosis not present

## 2015-09-01 DIAGNOSIS — G479 Sleep disorder, unspecified: Secondary | ICD-10-CM

## 2015-09-01 DIAGNOSIS — Z720 Tobacco use: Secondary | ICD-10-CM | POA: Diagnosis not present

## 2015-09-01 DIAGNOSIS — F1010 Alcohol abuse, uncomplicated: Secondary | ICD-10-CM | POA: Diagnosis not present

## 2015-09-01 DIAGNOSIS — E7800 Pure hypercholesterolemia, unspecified: Secondary | ICD-10-CM | POA: Diagnosis not present

## 2015-09-01 DIAGNOSIS — I10 Essential (primary) hypertension: Secondary | ICD-10-CM | POA: Diagnosis not present

## 2015-09-01 DIAGNOSIS — E039 Hypothyroidism, unspecified: Secondary | ICD-10-CM

## 2015-09-01 DIAGNOSIS — F17210 Nicotine dependence, cigarettes, uncomplicated: Secondary | ICD-10-CM

## 2015-09-01 MED ORDER — TRAMADOL HCL 50 MG PO TABS: 100.0000 mg | ORAL_TABLET | Freq: Four times a day (QID) | ORAL | Status: DC

## 2015-09-01 MED ORDER — PREDNISONE 5 MG PO TABS: 5.0000 mg | ORAL_TABLET | Freq: Every day | ORAL | Status: DC

## 2015-09-01 MED ORDER — VARENICLINE TARTRATE 0.5 MG X 11 & 1 MG X 42 PO MISC: ORAL | Status: DC

## 2015-09-01 MED ORDER — MIRTAZAPINE 15 MG PO TABS: 15.0000 mg | ORAL_TABLET | Freq: Every day | ORAL | Status: DC

## 2015-09-01 NOTE — Progress Notes (Signed)
Subjective:  Patient ID: Frank Rosales, male    DOB: 02-Feb-1956  Age: 60 y.o. MRN: [id number]  CC: Follow-up   HPI Frank Rosales presents for Patient presents for follow-up on  thyroid. The patient has a history of hypothyroidism for many years. It has been stable recently. Pt. denies any change in  voice, loss of hair, heat or cold intolerance. Energy level has been adequate to good. Patient denies constipation and diarrhea. No myxedema. Medication is as noted below. Verified that pt is taking it daily on an empty stomach. Well tolerated.  Follow-up of back pain as well. Seeing Dr. Gaetano Rosales. Still having 7/10 pain. Still smoking. 4-10 a day. Afraid of chantix due to alcohol consumption. Anxious at times. Xanax helps.   History Frank Rosales has a past medical history of High blood pressure; Asthma; High cholesterol; Hilar adenopathy; COPD (chronic obstructive pulmonary disease) ([place]); Anxiety; Colon polyps; Hypothyroidism; and Arthritis.   He has past surgical history that includes Knee arthroscopy (Right, 05/14/2010); Wisdom tooth extraction; epidural injections; Fixation kyphoplasty lumbar spine; and Back surgery.   His family history includes Emphysema in his father; Heart disease in his brother; Heart failure in his mother; Lung cancer in his father.He reports that he quit smoking about 6 months ago. His smoking use included Cigarettes. He started smoking about 42 years ago. He has a 70 pack-year smoking history. He has never used smokeless tobacco. He reports that he drinks about 25.2 oz of alcohol per week. He reports that he does not use illicit drugs.    ROS Review of Systems  Constitutional: Negative for fever, chills, diaphoresis and unexpected weight change.  HENT: Negative for congestion, hearing loss, rhinorrhea and sore throat.   Eyes: Negative for visual disturbance.  Respiratory: Positive for cough and shortness of breath.   Cardiovascular: Negative for chest pain.    Gastrointestinal: Negative for abdominal pain, diarrhea and constipation.  Genitourinary: Negative for dysuria and flank pain.  Musculoskeletal: Positive for back pain. Negative for joint swelling and arthralgias.  Skin: Negative for rash.  Neurological: Negative for dizziness and headaches.  Psychiatric/Behavioral: Positive for sleep disturbance, dysphoric mood and decreased concentration. The patient is nervous/anxious.     Objective:  BP 135/86 mmHg  Pulse 92  Temp(Src) 97.2 F (36.2 C) (Oral)  Ht _0  (1.778 m)  Wt 191 lb 12.8 oz (87 kg)  BMI 27.52 kg/m2  SpO2 96%  BP Readings from Last 3 Encounters:  09/01/15 135/86  07/06/15 111/77  06/16/15 150/96    Wt Readings from Last 3 Encounters:  09/01/15 191 lb 12.8 oz (87 kg)  07/06/15 190 lb 6.4 oz (86.365 kg)  06/16/15 186 lb (84.369 kg)     Physical Exam  Constitutional: He is oriented to person, place, and time. He appears well-developed and well-nourished. No distress.  HENT:  Head: Normocephalic and atraumatic.  Right Ear: External ear normal.  Left Ear: External ear normal.  Nose: Nose normal.  Mouth/Throat: Oropharynx is clear and moist.  Eyes: Conjunctivae and EOM are normal. Pupils are equal, round, and reactive to light.  Neck: Normal range of motion. Neck supple. No thyromegaly present.  Cardiovascular: Normal rate, regular rhythm and normal heart sounds.   No murmur heard. Pulmonary/Chest: Effort normal and breath sounds normal. No respiratory distress. He has no wheezes. He has no rales.  Abdominal: Soft. Bowel sounds are normal. He exhibits no distension. There is no tenderness.  Lymphadenopathy:    He has no cervical adenopathy.  Neurological: He is alert and oriented to person, place, and time. He has normal reflexes.  Skin: Skin is warm and dry.  Psychiatric: He has a normal mood and affect. His behavior is normal. Judgment and thought content normal.     Lab Results  Component Value Date    WBC 8.1 06/02/2015   HGB 14.5 05/01/2015   HCT 48.6 06/02/2015   PLT 238 06/02/2015   GLUCOSE 139* 06/02/2015   CHOL 225* 02/04/2015   TRIG 72 02/04/2015   HDL 97 02/04/2015   LDLCALC 114* 02/04/2015   ALT 25 06/02/2015   AST 22 06/02/2015   NA 135 06/02/2015   K 3.9 06/02/2015   CL 93* 06/02/2015   CREATININE 1.02 06/02/2015   BUN 8 06/02/2015   CO2 23 06/02/2015   TSH 1.780 01/28/2015   PSA 0.9 02/06/2013   INR 0.98 10/20/2010   HGBA1C 5.2 04/11/2015    Dg Chest 2 View  05/01/2015  CLINICAL DATA:  Fall. EXAM: CHEST  2 VIEW COMPARISON:  05/01/2015 FINDINGS: Normal heart size. No pleural effusion identified. The previously noted tiny right apical pneumothorax is not visualized. There is atelectasis in the right base with adjacent rib fractures. Subcutaneous emphysema within right lateral chest wall and right supraclavicular region is again noted. IMPRESSION: 1. No change in right chest wall subcutaneous emphysema. 2. Small right apical pneumothorax is no longer visualized. 3. Right base atelectasis. Electronically Signed   By: Kerby Moors M.D.   On: 05/01/2015 16:07   Dg Chest Port 1 View  05/02/2015  CLINICAL DATA:  Evaluate right-sided pneumothorax. EXAM: PORTABLE CHEST 1 VIEW COMPARISON:  05/01/2015; 10/27/2014 FINDINGS: Grossly unchanged cardiac silhouette and mediastinal contours given persistently reduced lung volumes. Grossly unchanged bilateral infrahilar opacities, right greater than left. No new focal airspace opacities. Unchanged trace of sided effusion. The amount of right lateral chest wall subcutaneous emphysema has minimally decreased in the interval. No pneumothorax. Known minimally displaced right-sided posterior lateral rib fractures suboptimally evaluated. IMPRESSION: 1. The amount of right lateral chest wall subcutaneous emphysema is minimally decreased in the interval. No pneumothorax. 2. Grossly unchanged findings of hypoventilation and bilateral infrahilar  opacities, likely atelectasis. Electronically Signed   By: Sandi Mariscal M.D.   On: 05/02/2015 07:37    Assessment & Plan:   Frank Rosales was seen today for follow-up.  Diagnoses and all orders for this visit:  COPD GOLD III  -     predniSONE (DELTASONE) 5 MG tablet; Take 1 tablet (5 mg total) by mouth daily with breakfast. -     TSH + free T4 -     CBC with Differential/Platelet -     CMP14+EGFR  Sleep disturbance -     mirtazapine (REMERON) 15 MG tablet; Take 1 tablet (15 mg total) by mouth at bedtime.  GAD (generalized anxiety disorder) -     mirtazapine (REMERON) 15 MG tablet; Take 1 tablet (15 mg total) by mouth at bedtime.  Cigarette smoker  Alcohol abuse  Essential hypertension -     TSH + free T4 -     CBC with Differential/Platelet -     CMP14+EGFR  High cholesterol -     Lipid panel  Hypothyroidism, unspecified hypothyroidism type -     TSH + free T4  Other orders -     traMADol (ULTRAM) 50 MG tablet; Take 2 tablets (100 mg total) by mouth every 6 (six) hours. -     varenicline (CHANTIX STARTING MONTH PAK) 0.5 MG  X 11 & 1 MG X 42 tablet; Take one 0.5 mg tablet by mouth once daily for 3 days, then increase to one 0.5 mg tablet twice daily for 4 days, then increase to one 1 mg tablet twice daily.      I have discontinued Mr. Ell[place]orpe ALPRAZolam, methocarbamol, oxyCODONE, traZODone, and traZODone. I am also having him start on varenicline. Additionally, I am having him maintain his aspirin, cholecalciferol, vitamin D-42, folic acid, levothyroxine, KRILL OIL PO, valsartan-hydrochlorothiazide, albuterol, budesonide-formoterol, meloxicam, NIFEdipine, predniSONE, traMADol, and mirtazapine.  Meds ordered this encounter  Medications  . predniSONE (DELTASONE) 5 MG tablet    Sig: Take 1 tablet (5 mg total) by mouth daily with breakfast.    Dispense:  90 tablet    Refill:  1  . traMADol (ULTRAM) 50 MG tablet    Sig: Take 2 tablets (100 mg total) by mouth every 6 (six)  hours.    Dispense:  100 tablet    Refill:  5  . mirtazapine (REMERON) 15 MG tablet    Sig: Take 1 tablet (15 mg total) by mouth at bedtime.    Dispense:  30 tablet    Refill:  5  . varenicline (CHANTIX STARTING MONTH PAK) 0.5 MG X 11 & 1 MG X 42 tablet    Sig: Take one 0.5 mg tablet by mouth once daily for 3 days, then increase to one 0.5 mg tablet twice daily for 4 days, then increase to one 1 mg tablet twice daily.    Dispense:  53 tablet    Refill:  0     Follow-up: Return in about 3 months (around 12/01/2015).  Claretta Fraise, M.D.

## 2015-09-02 LAB — LIPID PANEL
CHOL/HDL RATIO: 2.8 ratio (ref 0.0–5.0)
Cholesterol, Total: 197 mg/dL (ref 100–199)
HDL: 70 mg/dL (ref >39)
LDL CALC: 112 mg/dL — AB (ref 0–99)
TRIGLYCERIDES: 75 mg/dL (ref 0–149)
VLDL CHOLESTEROL CAL: 15 mg/dL (ref 5–40)

## 2015-09-02 LAB — CMP14+EGFR
ALBUMIN: 4.8 g/dL (ref 3.5–5.5)
ALT: 31 IU/L (ref 0–44)
AST: 21 IU/L (ref 0–40)
Albumin/Globulin Ratio: 1.8 (ref 1.2–2.2)
Alkaline Phosphatase: 95 IU/L (ref 39–117)
BUN / CREAT RATIO: 9 (ref 9–20)
BUN: 8 mg/dL (ref 6–24)
Bilirubin Total: 0.8 mg/dL (ref 0.0–1.2)
CALCIUM: 9.4 mg/dL (ref 8.7–10.2)
CO2: 23 mmol/L (ref 18–29)
CREATININE: 0.87 mg/dL (ref 0.76–1.27)
Chloride: 95 mmol/L — ABNORMAL LOW (ref 96–106)
GFR calc Af Amer: 111 mL/min/{1.73_m2} (ref >59)
GFR, EST NON AFRICAN AMERICAN: 96 mL/min/{1.73_m2} (ref >59)
GLOBULIN, TOTAL: 2.7 g/dL (ref 1.5–4.5)
GLUCOSE: 113 mg/dL — AB (ref 65–99)
Potassium: 4.4 mmol/L (ref 3.5–5.2)
SODIUM: 136 mmol/L (ref 134–144)
Total Protein: 7.5 g/dL (ref 6.0–8.5)

## 2015-09-02 LAB — CBC WITH DIFFERENTIAL/PLATELET
Basophils Absolute: 0.1 10*3/uL (ref 0.0–0.2)
Basos: 1 %
EOS (ABSOLUTE): 0 10*3/uL (ref 0.0–0.4)
EOS: 1 %
HEMATOCRIT: 50.8 % (ref 37.5–51.0)
HEMOGLOBIN: 18.1 g/dL — AB (ref 12.6–17.7)
IMMATURE GRANULOCYTES: 0 %
Immature Grans (Abs): 0 10*3/uL (ref 0.0–0.1)
LYMPHS: 21 %
Lymphocytes Absolute: 1.6 10*3/uL (ref 0.7–3.1)
MCH: 35 pg — ABNORMAL HIGH (ref 26.6–33.0)
MCHC: 35.6 g/dL (ref 31.5–35.7)
MCV: 98 fL — ABNORMAL HIGH (ref 79–97)
MONOCYTES: 9 %
Monocytes Absolute: 0.7 10*3/uL (ref 0.1–0.9)
NEUTROS PCT: 68 %
Neutrophils Absolute: 5.1 10*3/uL (ref 1.4–7.0)
Platelets: 227 10*3/uL (ref 150–379)
RBC: 5.17 x10E6/uL (ref 4.14–5.80)
RDW: 13.5 % (ref 12.3–15.4)
WBC: 7.5 10*3/uL (ref 3.4–10.8)

## 2015-09-02 LAB — TSH+FREE T4
Free T4: 1.61 ng/dL (ref 0.82–1.77)
TSH: 1.71 u[IU]/mL (ref 0.450–4.500)

## 2015-09-22 ENCOUNTER — Other Ambulatory Visit: Payer: Self-pay | Admitting: *Deleted

## 2015-09-22 DIAGNOSIS — M549 Dorsalgia, unspecified: Secondary | ICD-10-CM

## 2015-09-27 ENCOUNTER — Encounter: Payer: Self-pay | Admitting: Physical Therapy

## 2015-09-27 ENCOUNTER — Ambulatory Visit: Payer: 59 | Attending: Family Medicine | Admitting: Physical Therapy

## 2015-09-27 DIAGNOSIS — R293 Abnormal posture: Secondary | ICD-10-CM | POA: Diagnosis present

## 2015-09-27 DIAGNOSIS — M5441 Lumbago with sciatica, right side: Secondary | ICD-10-CM | POA: Diagnosis present

## 2015-09-27 NOTE — Therapy (Signed)
[facility] [place], [place], [address] Phone: [phone]   Fax:  [phone]  Physical Therapy Evaluation  Patient Details  Name: Frank Rosales MRN: [id number] Date of Birth: 28-Jun-1955 Referring Provider: Claretta Fraise MD  Encounter Date: 09/27/2015      PT End of Session - 09/27/15 1112    Visit Number 1   Number of Visits 12   Date for PT Re-Evaluation 11/08/15   PT Start Time 0900   PT Stop Time 0952   PT Time Calculation (min) 52 min   Activity Tolerance Patient tolerated treatment well   Behavior During Therapy [facility] for tasks assessed/performed      Past Medical History  Diagnosis Date  . High blood pressure   . Asthma   . High cholesterol   . Hilar adenopathy   . COPD (chronic obstructive pulmonary disease) ([place])   . Anxiety   . Colon polyps   . Hypothyroidism   . Arthritis     "neck; lower back; right hip; right knee" (10/18/2014)    Past Surgical History  Procedure Laterality Date  . Knee arthroscopy Right 05/14/2010  . Wisdom tooth extraction    . Epidural injections    . Fixation kyphoplasty lumbar spine      "L1"  . Back surgery      Filed Vitals:   09/27/15 0855  SpO2: 95%         Subjective Assessment - 09/27/15 0855    Subjective (p) I would like to decrese my back pain so I could walk for exercise.   Limitations (p) Standing   How long can you stand comfortably? (p) Less than 10 minutes.   Patient Stated Goals (p) Walk for exercise.   Currently in Pain? (p) Yes   Pain Score (p) 4    Pain Location (p) Back   Pain Orientation (p) Right;Left;Lower;Mid   Pain Descriptors / Indicators (p) Aching;Constant   Pain Onset (p) More than a month ago   Pain Frequency (p) Constant   Aggravating Factors  (p) Standing, walking and going from sit to stand.            [facility] PT Assessment - 09/27/15 0001    Assessment   Medical Diagnosis Back pain.   Referring Provider Claretta Fraise  MD   Onset Date/Surgical Date --  6 years+.   Hand Dominance Right   Prior Therapy --  OP.   Precautions   Precaution Comments COPD.   Restrictions   Weight Bearing Restrictions Yes   Balance Screen   Has the patient fallen in the past 6 months Yes   How many times? Slipped.  1   Has the patient had a decrease in activity level because of a fear of falling?  No   Is the patient reluctant to leave their home because of a fear of falling?  No   Home [employer] residence   Prior Function   Level of Independence Independent   Cognition   Overall Cognitive Status Within Functional Limits for tasks assessed   Posture/Postural Control   Posture/Postural Control Postural limitations   Postural Limitations Decreased lumbar lordosis   ROM / Strength   AROM / PROM / Strength AROM;Strength   AROM   Overall AROM Comments Right hip flexion= 105 degrees and left= 120 degrees.  Deferred active spinal testing due to OP.   Strength   Overall Strength Comments Normal bilateral LE strength.  Palpation   Palpation comment Patient reports a "jamming" feeling in his spine and occasional intense pain into his right LE to his foot.   Special Tests    Special Tests Lumbar;Leg LengthTest   Lumbar Tests --  (-) SLR testing. Norm Pat and absent Ach DTR's.   Leg length test  --  RT LE 3/4 inch shorter than left.   Ambulation/Gait   Gait Comments [place] type gait due to back and ongoing knee pain.                   [facility] Adult PT Treatment/Exercise - 09/27/15 0001    Modalities   Modalities Electrical Stimulation;Moist Heat   Moist Heat Therapy   Number Minutes Moist Heat 15 Minutes   Moist Heat Location --  Low back.   Acupuncturist Location --  Low back.   Electrical Stimulation Action IFC   Electrical Stimulation Parameters 80-150 HZ at 100% scan   Electrical Stimulation Goals Pain                   PT Short Term Goals - 09/27/15 1118    PT SHORT TERM GOAL #1   Title Ind with a HEP.   Time 6   Period Weeks   Status New   PT SHORT TERM GOAL #2   Title Stand 20 minutes with pain not > 3/10.   Time 6   Period Weeks   Status New   PT SHORT TERM GOAL #3   Title Sit to stand with pain not > 3-4/10.   Time 6   Period Weeks   Status New   PT SHORT TERM GOAL #4   Title Perform ADL's with pain not > 4/10.   Time 6   Period Weeks   Status New   PT SHORT TERM GOAL #5   Title Walk one mile with pain not > 3-4/10.   Time 6   Period Weeks   Status New   Additional Short Term Goals   Additional Short Term Goals Yes   PT SHORT TERM GOAL #6   Title Right hip flexion= 115 degrees.   Time 6   Period Weeks   Status New                  Plan - 09/27/15 1112    Clinical Impression Statement The patient has had ongoing and worsening low back pain over the last 6 years.  He reports a "jamming" feeling in his low back with intense (10/10) pain into his right LE to his foot on occasions.  He is unable to walk for long distance which he would like to do for exercise.  He is also unable to stand for more than 10 minutes.  He is also in a great deal of pain when he goes from sit to stand.  He has had injections and been on multiple pain medications with temporary and minimal  pain relief.     Rehab Potential Good   PT Frequency 2x / week   PT Duration 6 weeks   PT Treatment/Interventions ADLs/Self Care Home Management;Electrical Stimulation;Moist Heat;Therapeutic exercise;Therapeutic activities;Ultrasound;Patient/family education;Manual techniques   PT Next Visit Plan Avoid spinal loading.  Low-level core exrcises beginning with draw-in progression in supine.  Modalities PRN.  NO traction. Right SKTC.   Consulted and Agree with Plan of Care Patient      Patient will benefit from skilled therapeutic intervention in order to  improve the following deficits and impairments:  Pain,  Decreased activity tolerance, Decreased range of motion  Visit Diagnosis: Lumbago with sciatica, right side - Plan: PT plan of care cert/re-cert  Abnormal posture - Plan: PT plan of care cert/re-cert     Problem List Patient Active Problem List   Diagnosis Date Noted  . Sleep disturbance 07/06/2015  . Alcohol abuse 10/19/2014  . GAD (generalized anxiety disorder) 07/01/2014  . Hypothyroidism 12/03/2013  . Osteopenia 03/11/2013  . Cigarette smoker 07/12/2011  . Essential hypertension   . COPD GOLD III    . High cholesterol   . Hilar adenopathy     APPLEGATE, Frank Rosales 09/27/2015, 11:27 AM  [facility] [place], [place], [address] Phone: [phone]   Fax:  [phone]  Name: Frank Rosales MRN: [id number] Date of Birth: 11-24-1955

## 2015-09-28 ENCOUNTER — Encounter: Payer: Self-pay | Admitting: *Deleted

## 2015-10-05 ENCOUNTER — Ambulatory Visit: Payer: 59 | Admitting: Physical Therapy

## 2015-10-05 DIAGNOSIS — M5441 Lumbago with sciatica, right side: Secondary | ICD-10-CM | POA: Diagnosis not present

## 2015-10-05 NOTE — Therapy (Signed)
[facility] [place], [place], [address] Phone: [phone]   Fax:  [phone]  Physical Therapy Treatment  Patient Details  Name: Frank Rosales MRN: [id number] Date of Birth: 11-20-1955 Referring Provider: Claretta Fraise MD  Encounter Date: 10/05/2015      PT End of Session - 10/05/15 1106    Visit Number 2   Number of Visits 12   Date for PT Re-Evaluation 11/08/15   PT Start Time 0900   PT Stop Time 0953   PT Time Calculation (min) 53 min   Activity Tolerance Patient tolerated treatment well   Behavior During Therapy [facility] for tasks assessed/performed      Past Medical History  Diagnosis Date  . High blood pressure   . Asthma   . High cholesterol   . Hilar adenopathy   . COPD (chronic obstructive pulmonary disease) ([place])   . Anxiety   . Colon polyps   . Hypothyroidism   . Arthritis     "neck; lower back; right hip; right knee" (10/18/2014)    Past Surgical History  Procedure Laterality Date  . Knee arthroscopy Right 05/14/2010  . Wisdom tooth extraction    . Epidural injections    . Fixation kyphoplasty lumbar spine      "L1"  . Back surgery      There were no vitals filed for this visit.      Subjective Assessment - 10/05/15 0939    Subjective My back pain is about a 3/10 but if i stand too long my pain can go up a lot higher.   Limitations Standing   Patient Stated Goals Stand and walk without pain.   Pain Score 3    Pain Location Back   Pain Orientation Mid;Lower   Pain Descriptors / Indicators Aching   Pain Onset More than a month ago   Pain Frequency Constant                         [facility] Adult PT Treatment/Exercise - 10/05/15 0001    Modalities   Modalities Electrical Stimulation;Moist Heat;Ultrasound   Moist Heat Therapy   Number Minutes Moist Heat 15 Minutes   Moist Heat Location Lumbar Spine   Electrical Stimulation   Electrical Stimulation Location Low back.   Electrical Stimulation Action IFC   Electrical Stimulation Parameters 80-150 Hz.   Electrical Stimulation Goals --  Pain.   Ultrasound   Ultrasound Location --  Low back.   Ultrasound Parameters 1.50 W/CM2 at 1.50 x 12 minutes.   Manual Therapy   Manual therapy comments Prone over 2 pillows:  STW/M to bilateral low back musculature x 13 minutes.                  PT Short Term Goals - 09/27/15 1118    PT SHORT TERM GOAL #1   Title Ind with a HEP.   Time 6   Period Weeks   Status New   PT SHORT TERM GOAL #2   Title Stand 20 minutes with pain not > 3/10.   Time 6   Period Weeks   Status New   PT SHORT TERM GOAL #3   Title Sit to stand with pain not > 3-4/10.   Time 6   Period Weeks   Status New   PT SHORT TERM GOAL #4   Title Perform ADL's with pain not > 4/10.   Time 6   Period Weeks  Status New   PT SHORT TERM GOAL #5   Title Walk one mile with pain not > 3-4/10.   Time 6   Period Weeks   Status New   Additional Short Term Goals   Additional Short Term Goals Yes   PT SHORT TERM GOAL #6   Title Right hip flexion= 115 degrees.   Time 6   Period Weeks   Status New                Patient will benefit from skilled therapeutic intervention in order to improve the following deficits and impairments:     Visit Diagnosis: Lumbago with sciatica, right side     Problem List Patient Active Problem List   Diagnosis Date Noted  . Sleep disturbance 07/06/2015  . Alcohol abuse 10/19/2014  . GAD (generalized anxiety disorder) 07/01/2014  . Hypothyroidism 12/03/2013  . Osteopenia 03/11/2013  . Cigarette smoker 07/12/2011  . Essential hypertension   . COPD GOLD III    . High cholesterol   . Hilar adenopathy     Maritza Hosterman, [place] MPT 10/05/2015, 11:08 AM  [facility] [place], [place], [address] Phone: [phone]   Fax:  [phone]  Name: Frank Rosales MRN: [id number] Date of  Birth: 04-Oct-1955

## 2015-10-12 ENCOUNTER — Encounter: Payer: Self-pay | Admitting: Physical Therapy

## 2015-10-12 ENCOUNTER — Ambulatory Visit: Payer: 59 | Admitting: Physical Therapy

## 2015-10-12 DIAGNOSIS — M5441 Lumbago with sciatica, right side: Secondary | ICD-10-CM | POA: Diagnosis not present

## 2015-10-12 DIAGNOSIS — R293 Abnormal posture: Secondary | ICD-10-CM

## 2015-10-12 NOTE — Therapy (Addendum)
[facility] [place], [place], [address] Phone: [phone]   Fax:  [phone]  Physical Therapy Treatment  Patient Details  Name: Frank Rosales MRN: [id number] Date of Birth: 20-Mar-1956 Referring Provider: Claretta Fraise MD  Encounter Date: 10/12/2015      PT End of Session - 10/12/15 0856    Visit Number 3   Number of Visits 12   Date for PT Re-Evaluation 11/08/15   PT Start Time 0858   PT Stop Time 0946   PT Time Calculation (min) 48 min   Activity Tolerance Patient tolerated treatment well   Behavior During Therapy [facility] for tasks assessed/performed      Past Medical History  Diagnosis Date  . High blood pressure   . Asthma   . High cholesterol   . Hilar adenopathy   . COPD (chronic obstructive pulmonary disease) ([place])   . Anxiety   . Colon polyps   . Hypothyroidism   . Arthritis     "neck; lower back; right hip; right knee" (10/18/2014)    Past Surgical History  Procedure Laterality Date  . Knee arthroscopy Right 05/14/2010  . Wisdom tooth extraction    . Epidural injections    . Fixation kyphoplasty lumbar spine      "L1"  . Back surgery      There were no vitals filed for this visit.      Subjective Assessment - 10/12/15 0856    Subjective Reports stiffness this morning but prolonged sitting and standing makes pain worse. Reports not being able to stay in one positiion long due to pain and discomfort.   Limitations Standing   How long can you stand comfortably? 10 minutes   Patient Stated Goals Stand and walk without pain.   Currently in Pain? Yes   Pain Score 4    Pain Location Back   Pain Orientation Right;Left;Lower   Pain Descriptors / Indicators Other (Comment)  Stiffness   Pain Radiating Towards down RLE   Pain Onset More than a month ago            [facility] PT Assessment - 10/12/15 0001    Assessment   Medical Diagnosis Back pain.   Hand Dominance Right   Next MD Visit 01/2016    Precautions   Precaution Comments COPD.                     [facility] Adult PT Treatment/Exercise - 10/12/15 0001    Exercises   Exercises Lumbar   Lumbar Exercises: Stretches   Single Knee to Chest Stretch 3 reps;30 seconds;Other (comment)  BLE   Piriformis Stretch 3 reps;30 seconds;Other (comment)  BLE   Lumbar Exercises: Supine   Ab Set 20 reps;5 seconds   Clam 20 reps   Bent Knee Raise 3 seconds;15 reps  Reported R hip pain with exercise   Modalities   Modalities Electrical Stimulation;Moist Heat   Moist Heat Therapy   Number Minutes Moist Heat 15 Minutes   Moist Heat Location Lumbar Spine   Electrical Stimulation   Electrical Stimulation Location B lumbar paraspinals   Electrical Stimulation Action IFC   Electrical Stimulation Parameters 80-150 hz x15 min   Electrical Stimulation Goals Pain                PT Education - 10/12/15 0915    Education provided Yes   Education Details HEP- SKTC, Piriformis stretch, ab set, marching   Person(s) Educated Patient  Methods Explanation;Verbal cues;Handout;Demonstration   Comprehension Verbalized understanding;Verbal cues required;Returned demonstration          PT Short Term Goals - 09/27/15 1118    PT SHORT TERM GOAL #1   Title Ind with a HEP.   Time 6   Period Weeks   Status New   PT SHORT TERM GOAL #2   Title Stand 20 minutes with pain not > 3/10.   Time 6   Period Weeks   Status New   PT SHORT TERM GOAL #3   Title Sit to stand with pain not > 3-4/10.   Time 6   Period Weeks   Status New   PT SHORT TERM GOAL #4   Title Perform ADL's with pain not > 4/10.   Time 6   Period Weeks   Status New   PT SHORT TERM GOAL #5   Title Walk one mile with pain not > 3-4/10.   Time 6   Period Weeks   Status New   Additional Short Term Goals   Additional Short Term Goals Yes   PT SHORT TERM GOAL #6   Title Right hip flexion= 115 degrees.   Time 6   Period Weeks   Status New                   Plan - 10/12/15 0932    Clinical Impression Statement Patient arrived to clinic with increased lumbar stiffness per patient report as prolonged sitting and standing increases his low back pain. Patient able to complete stretches and core exercises well following moderate multimodal cueing for proper exercise technique and any corrections. Patient reported experiencing R hip discomfort with supine marching exercises and experienced a cramp in abdominal muscles following ab set and marching. Patient was educated in regards to HEP exercises and parameters and verbalized understanding following instruction. Normal modalites response noted following removal of the modalities. Patient reported lumbar discomfort upon end of treatment but gave no numerical rating   Rehab Potential Good   PT Frequency 2x / week   PT Duration 6 weeks   PT Treatment/Interventions ADLs/Self Care Home Management;Electrical Stimulation;Moist Heat;Therapeutic exercise;Therapeutic activities;Ultrasound;Patient/family education;Manual techniques   PT Next Visit Plan Avoid spinal loading.  Low-level core exrcises beginning with draw-in progression in supine.  Modalities PRN.  NO traction. Right SKTC.   PT Home Exercise Plan HEP- SKTC, piriformis stretches, ab set, marching   Consulted and Agree with Plan of Care Patient      Patient will benefit from skilled therapeutic intervention in order to improve the following deficits and impairments:  Pain, Decreased activity tolerance, Decreased range of motion  Visit Diagnosis: Lumbago with sciatica, right side  Abnormal posture     Problem List Patient Active Problem List   Diagnosis Date Noted  . Sleep disturbance 07/06/2015  . Alcohol abuse 10/19/2014  . GAD (generalized anxiety disorder) 07/01/2014  . Hypothyroidism 12/03/2013  . Osteopenia 03/11/2013  . Cigarette smoker 07/12/2011  . Essential hypertension   . COPD GOLD III    . High  cholesterol   . Hilar adenopathy     Wynelle Fanny, PTA 10/12/2015, 9:49 AM  [facility] [place], [place], [address] Phone: [phone]   Fax:  [phone]  Name: KURK CORNIEL MRN: [id number] Date of Birth: 12/03/1955   ,PHYSICAL THERAPY DISCHARGE SUMMARY  Visits from Start of Care: 3.  Current functional level related to goals / functional outcomes: See above.  Remaining deficits: No goals met.   Education / Equipment: HEP. Plan: Patient agrees to discharge.  Patient goals were not met. Patient is being discharged due to not returning since the last visit.  ?????         Mali Applegate MPT

## 2015-10-12 NOTE — Patient Instructions (Signed)
Knee-to-Chest Stretch: Unilateral    With hand behind right or left knee, pull knee in to chest until a comfortable stretch is felt in lower back and buttocks. Keep back relaxed. Hold __30__ seconds. Repeat __3__ times per set. Do __1__ sets per session. Do _2-3___ sessions per day.  http://orth.exer.us/126   Copyright  VHI. All rights reserved.  Stretching: Piriformis (Supine)    Pull right or left knee toward opposite shoulder. Hold __30__ seconds. Relax. Repeat _3___ times per set. Do __1__ sets per session. Do _2-3___ sessions per day.  http://orth.exer.us/712   Copyright  VHI. All rights reserved.  Bent Leg Lift (Hook-Lying)    Tighten stomach and slowly raise right leg __3-4__ inches from floor. Keep trunk rigid. Hold __3-5__ seconds. Repeat _10___ times per set. Do __2-3__ sets per session. Do _2-3___ sessions per day.  http://orth.exer.us/1090   Copyright  VHI. All rights reserved.  Pelvic Tilt    Flatten back by tightening stomach muscles and buttocks. Repeat __10__ times per set. Do __2-3__ sets per session. Do __2-3__ sessions per day.  http://orth.exer.us/134   Copyright  VHI. All rights reserved.

## 2015-10-25 ENCOUNTER — Other Ambulatory Visit: Payer: Self-pay | Admitting: *Deleted

## 2015-10-25 DIAGNOSIS — F411 Generalized anxiety disorder: Secondary | ICD-10-CM

## 2015-10-25 DIAGNOSIS — G479 Sleep disorder, unspecified: Secondary | ICD-10-CM

## 2015-10-25 MED ORDER — MIRTAZAPINE 15 MG PO TABS: 15.0000 mg | ORAL_TABLET | Freq: Every day | ORAL | Status: DC

## 2015-12-02 ENCOUNTER — Ambulatory Visit: Payer: 59 | Admitting: Family Medicine

## 2015-12-05 ENCOUNTER — Encounter: Payer: Self-pay | Admitting: Family Medicine

## 2015-12-05 ENCOUNTER — Ambulatory Visit (INDEPENDENT_AMBULATORY_CARE_PROVIDER_SITE_OTHER): Payer: 59 | Admitting: Family Medicine

## 2015-12-05 ENCOUNTER — Telehealth: Payer: Self-pay | Admitting: *Deleted

## 2015-12-05 VITALS — BP 107/74 | HR 102 | Temp 97.9°F | Ht 70.0 in | Wt 201.2 lb

## 2015-12-05 DIAGNOSIS — L989 Disorder of the skin and subcutaneous tissue, unspecified: Secondary | ICD-10-CM

## 2015-12-05 DIAGNOSIS — J449 Chronic obstructive pulmonary disease, unspecified: Secondary | ICD-10-CM

## 2015-12-05 DIAGNOSIS — I10 Essential (primary) hypertension: Secondary | ICD-10-CM | POA: Diagnosis not present

## 2015-12-05 DIAGNOSIS — E039 Hypothyroidism, unspecified: Secondary | ICD-10-CM | POA: Diagnosis not present

## 2015-12-05 DIAGNOSIS — F17210 Nicotine dependence, cigarettes, uncomplicated: Secondary | ICD-10-CM

## 2015-12-05 DIAGNOSIS — D126 Benign neoplasm of colon, unspecified: Secondary | ICD-10-CM

## 2015-12-05 DIAGNOSIS — Z720 Tobacco use: Secondary | ICD-10-CM

## 2015-12-05 DIAGNOSIS — F411 Generalized anxiety disorder: Secondary | ICD-10-CM | POA: Diagnosis not present

## 2015-12-05 DIAGNOSIS — G479 Sleep disorder, unspecified: Secondary | ICD-10-CM | POA: Diagnosis not present

## 2015-12-05 DIAGNOSIS — E7800 Pure hypercholesterolemia, unspecified: Secondary | ICD-10-CM | POA: Diagnosis not present

## 2015-12-05 MED ORDER — LEVOTHYROXINE SODIUM 50 MCG PO TABS: 50.0000 ug | ORAL_TABLET | Freq: Every day | ORAL | 3 refills | Status: DC

## 2015-12-05 MED ORDER — VALSARTAN-HYDROCHLOROTHIAZIDE 320-25 MG PO TABS: 1.0000 | ORAL_TABLET | Freq: Every day | ORAL | 1 refills | Status: DC

## 2015-12-05 MED ORDER — MIRTAZAPINE 30 MG PO TABS: 30.0000 mg | ORAL_TABLET | Freq: Every day | ORAL | 5 refills | Status: DC

## 2015-12-05 NOTE — Progress Notes (Signed)
Subjective:  Patient ID: Frank Rosales, male    DOB: 1955/08/26  Age: 60 y.o. MRN: [id number]  CC: Hypertension (3 mth rck); COPD; and Hypothyroidism   HPI LOUDEN POULIOT presents for recheck of depression - still feels sad in spite of PHQ. Anxious due to insurance resistance.  Depression screen [facility] 2/9 12/05/2015 09/01/2015 07/06/2015 05/12/2015 04/06/2015  Decreased Interest 0 0 0 0 0  Down, Depressed, Hopeless 0 0 1 1 1   PHQ - 2 Score 0 0 1 1 1   Altered sleeping 0 - - - -  Tired, decreased energy 0 - - - -  Change in appetite 0 - - - -  Feeling bad or failure about yourself  0 - - - -  Trouble concentrating 0 - - - -  Moving slowly or fidgety/restless 0 - - - -  Suicidal thoughts 0 - - - -  PHQ-9 Score 0 - - - -   Not able to do much. That makes him feel depressed. Breathing, back & leg pain inhibit even light activity. Afraid to walk. Somedays pain won't let him walk bed to bath. Even on good days runs out of energy. Hard to  Find a comfortable position due to back pain.   Patient presents for follow-up on  thyroid. The patient has a history of hypothyroidism for many years. It has been stable recently. Pt. denies any change in  voice, loss of hair, heat or cold intolerance. Energy level has been adequate to good. Patient denies constipation and diarrhea. No myxedema. Medication is as noted below. Verified that pt is taking it daily on an empty stomach. Well tolerated.  History of colon polyps and he is to have a 3 month year recheck. That is due in October. He would like to go ahead with setting up that appointment due to his complicated history of having to have a tertiary care procedure to remove a large polyp a few years ago.  Additionally he has a place on his lip that's been there about a year it doesn't hurt is just hard and discolored that he would like checked. History Cesare has a past medical history of Anxiety; Arthritis; Asthma; Colon polyps; COPD (chronic  obstructive pulmonary disease) ([place]); High blood pressure; High cholesterol; Hilar adenopathy; and Hypothyroidism.   He has a past surgical history that includes Knee arthroscopy (Right, 05/14/2010); Wisdom tooth extraction; epidural injections; Fixation kyphoplasty lumbar spine; and Back surgery.   His family history includes Emphysema in his father; Heart disease in his brother; Heart failure in his mother; Lung cancer in his father.He reports that he quit smoking about 9 months ago. His smoking use included Cigarettes. He started smoking about 42 years ago. He has a 70.00 pack-year smoking history. He has never used smokeless tobacco. He reports that he drinks about 25.2 oz of alcohol per week . He reports that he does not use drugs.    ROS Review of Systems  Constitutional: Negative for chills, diaphoresis, fever and unexpected weight change.  HENT: Negative for congestion, hearing loss, rhinorrhea and sore throat.   Eyes: Negative for visual disturbance.  Respiratory: Negative for cough and shortness of breath.   Cardiovascular: Negative for chest pain.  Gastrointestinal: Negative for abdominal pain, constipation and diarrhea.  Genitourinary: Negative for dysuria and flank pain.  Musculoskeletal: Negative for arthralgias and joint swelling.  Skin: Negative for rash.  Neurological: Negative for dizziness and headaches.  Psychiatric/Behavioral: Negative for dysphoric mood and sleep disturbance.  Objective:  BP 107/74 (BP Location: Left Arm, Patient Position: Sitting, Cuff Size: Normal)   Pulse (!) 102   Temp 97.9 F (36.6 C) (Oral)   Ht 5\' 10"  (1.778 m)   Wt 201 lb 3.2 oz (91.3 kg)   SpO2 93%   BMI 28.87 kg/m   BP Readings from Last 3 Encounters:  12/05/15 107/74  09/01/15 135/86  07/06/15 111/77    Wt Readings from Last 3 Encounters:  12/05/15 201 lb 3.2 oz (91.3 kg)  09/01/15 191 lb 12.8 oz (87 kg)  07/06/15 190 lb 6.4 oz (86.4 kg)     Physical Exam    Constitutional: He is oriented to person, place, and time. He appears well-developed and well-nourished.  HENT:  Head: Normocephalic and atraumatic.  Right Ear: Tympanic membrane and external ear normal. No decreased hearing is noted.  Left Ear: Tympanic membrane and external ear normal. No decreased hearing is noted.  Mouth/Throat: No oropharyngeal exudate or posterior oropharyngeal erythema.  Eyes: Pupils are equal, round, and reactive to light.  Neck: Normal range of motion. Neck supple.  Cardiovascular: Normal rate and regular rhythm.   No murmur heard. Pulmonary/Chest: Effort normal. No respiratory distress. He has wheezes. He has no rales. He exhibits no tenderness.  Abdominal: Soft. Bowel sounds are normal. He exhibits no mass. There is no tenderness.  Neurological: He is alert and oriented to person, place, and time.  Skin:  White indurated lesion at mid lowe lip. 3 mm  Psychiatric: His speech is normal and behavior is normal. Judgment and thought content normal. His mood appears anxious. His affect is blunt. Cognition and memory are normal.  Vitals reviewed.    Lab Results  Component Value Date   WBC 7.5 09/01/2015   HGB 14.5 05/01/2015   HCT 50.8 09/01/2015   PLT 227 09/01/2015   GLUCOSE 113 (H) 09/01/2015   CHOL 197 09/01/2015   TRIG 75 09/01/2015   HDL 70 09/01/2015   LDLCALC 112 (H) 09/01/2015   ALT 31 09/01/2015   AST 21 09/01/2015   NA 136 09/01/2015   K 4.4 09/01/2015   CL 95 (L) 09/01/2015   CREATININE 0.87 09/01/2015   BUN 8 09/01/2015   CO2 23 09/01/2015   TSH 1.710 09/01/2015   PSA 0.9 02/06/2013   INR 0.98 10/20/2010   HGBA1C 5.2 04/11/2015    Dg Chest 2 View  Result Date: 05/01/2015 CLINICAL DATA:  Fall. EXAM: CHEST  2 VIEW COMPARISON:  05/01/2015 FINDINGS: Normal heart size. No pleural effusion identified. The previously noted tiny right apical pneumothorax is not visualized. There is atelectasis in the right base with adjacent rib fractures.  Subcutaneous emphysema within right lateral chest wall and right supraclavicular region is again noted. IMPRESSION: 1. No change in right chest wall subcutaneous emphysema. 2. Small right apical pneumothorax is no longer visualized. 3. Right base atelectasis. Electronically Signed   By: Kerby Moors M.D.   On: 05/01/2015 16:07   Dg Chest Port 1 View  Result Date: 05/02/2015 CLINICAL DATA:  Evaluate right-sided pneumothorax. EXAM: PORTABLE CHEST 1 VIEW COMPARISON:  05/01/2015; 10/27/2014 FINDINGS: Grossly unchanged cardiac silhouette and mediastinal contours given persistently reduced lung volumes. Grossly unchanged bilateral infrahilar opacities, right greater than left. No new focal airspace opacities. Unchanged trace of sided effusion. The amount of right lateral chest wall subcutaneous emphysema has minimally decreased in the interval. No pneumothorax. Known minimally displaced right-sided posterior lateral rib fractures suboptimally evaluated. IMPRESSION: 1. The amount of right lateral chest  wall subcutaneous emphysema is minimally decreased in the interval. No pneumothorax. 2. Grossly unchanged findings of hypoventilation and bilateral infrahilar opacities, likely atelectasis. Electronically Signed   By: Sandi Mariscal M.D.   On: 05/02/2015 07:37    Assessment & Plan:   Jeanne was seen today for hypertension, copd and hypothyroidism.  Diagnoses and all orders for this visit:  Polyposis coli -     Ambulatory referral to Gastroenterology -     HIV antibody -     Hepatitis C antibody  Sleep disturbance -     mirtazapine (REMERON) 30 MG tablet; Take 1 tablet (30 mg total) by mouth at bedtime. -     HIV antibody -     Hepatitis C antibody  GAD (generalized anxiety disorder) -     mirtazapine (REMERON) 30 MG tablet; Take 1 tablet (30 mg total) by mouth at bedtime. -     HIV antibody -     Hepatitis C antibody  Cigarette smoker -     HIV antibody -     Hepatitis C antibody  COPD GOLD  III  -     HIV antibody -     Hepatitis C antibody -     PR BREATHING CAPACITY TEST  Essential hypertension -     HIV antibody -     Hepatitis C antibody  High cholesterol -     HIV antibody -     Hepatitis C antibody  Hypothyroidism, unspecified hypothyroidism type -     HIV antibody -     Hepatitis C antibody      I have discontinued Mr. Nastase meloxicam, predniSONE, and varenicline. I have also changed his mirtazapine. Additionally, I am having him maintain his aspirin, cholecalciferol, vitamin 0000000, folic acid, levothyroxine, KRILL OIL PO, valsartan-hydrochlorothiazide, albuterol, budesonide-formoterol, NIFEdipine, and traMADol.  Meds ordered this encounter  Medications  . mirtazapine (REMERON) 30 MG tablet    Sig: Take 1 tablet (30 mg total) by mouth at bedtime.    Dispense:  30 tablet    Refill:  5     Follow-up: Return in about 3 months (around 03/06/2016) for COPD, Hypothyroidism.  Claretta Fraise, M.D.

## 2015-12-05 NOTE — Patient Instructions (Signed)
There is now good information recommending Vapes as a way to help stop smoking  Smoking Cessation, Tips for Success If you are ready to quit smoking, congratulations! You have chosen to help yourself be healthier. Cigarettes bring nicotine, tar, carbon monoxide, and other irritants into your body. Your lungs, heart, and blood vessels will be able to work better without these poisons. There are many different ways to quit smoking. Nicotine gum, nicotine patches, a nicotine inhaler, or nicotine nasal spray can help with physical craving. Hypnosis, support groups, and medicines help break the habit of smoking. WHAT THINGS CAN I DO TO MAKE QUITTING EASIER?  Here are some tips to help you quit for good:  Pick a date when you will quit smoking completely. Tell all of your friends and family about your plan to quit on that date.  Do not try to slowly cut down on the number of cigarettes you are smoking. Pick a quit date and quit smoking completely starting on that day.  Throw away all cigarettes.   Clean and remove all ashtrays from your home, work, and car.  On a card, write down your reasons for quitting. Carry the card with you and read it when you get the urge to smoke.  Cleanse your body of nicotine. Drink enough water and fluids to keep your urine clear or pale yellow. Do this after quitting to flush the nicotine from your body.  Learn to predict your moods. Do not let a bad situation be your excuse to have a cigarette. Some situations in your life might tempt you into wanting a cigarette.  Never have "just one" cigarette. It leads to wanting another and another. Remind yourself of your decision to quit.  Change habits associated with smoking. If you smoked while driving or when feeling stressed, try other activities to replace smoking. Stand up when drinking your coffee. Brush your teeth after eating. Sit in a different chair when you read the paper. Avoid alcohol while trying to quit, and  try to drink fewer caffeinated beverages. Alcohol and caffeine may urge you to smoke.  Avoid foods and drinks that can trigger a desire to smoke, such as sugary or spicy foods and alcohol.  Ask people who smoke not to smoke around you.  Have something planned to do right after eating or having a cup of coffee. For example, plan to take a walk or exercise.  Try a relaxation exercise to calm you down and decrease your stress. Remember, you may be tense and nervous for the first 2 weeks after you quit, but this will pass.  Find new activities to keep your hands busy. Play with a pen, coin, or rubber band. Doodle or draw things on paper.  Brush your teeth right after eating. This will help cut down on the craving for the taste of tobacco after meals. You can also try mouthwash.   Use oral substitutes in place of cigarettes. Try using lemon drops, carrots, cinnamon sticks, or chewing gum. Keep them handy so they are available when you have the urge to smoke.  When you have the urge to smoke, try deep breathing.  Designate your home as a nonsmoking area.  If you are a heavy smoker, ask your health care provider about a prescription for nicotine chewing gum. It can ease your withdrawal from nicotine.  Reward yourself. Set aside the cigarette money you save and buy yourself something nice.  Look for support from others. Join a support group or smoking  cessation program. Ask someone at home or at work to help you with your plan to quit smoking.  Always ask yourself, "Do I need this cigarette or is this just a reflex?" Tell yourself, "Today, I choose not to smoke," or "I do not want to smoke." You are reminding yourself of your decision to quit.  Do not replace cigarette smoking with electronic cigarettes (commonly called e-cigarettes). The safety of e-cigarettes is unknown, and some may contain harmful chemicals.  If you relapse, do not give up! Plan ahead and think about what you will do the  next time you get the urge to smoke. HOW WILL I FEEL WHEN I QUIT SMOKING? You may have symptoms of withdrawal because your body is used to nicotine (the addictive substance in cigarettes). You may crave cigarettes, be irritable, feel very hungry, cough often, get headaches, or have difficulty concentrating. The withdrawal symptoms are only temporary. They are strongest when you first quit but will go away within 10-14 days. When withdrawal symptoms occur, stay in control. Think about your reasons for quitting. Remind yourself that these are signs that your body is healing and getting used to being without cigarettes. Remember that withdrawal symptoms are easier to treat than the major diseases that smoking can cause.  Even after the withdrawal is over, expect periodic urges to smoke. However, these cravings are generally short lived and will go away whether you smoke or not. Do not smoke! WHAT RESOURCES ARE AVAILABLE TO HELP ME QUIT SMOKING? Your health care provider can direct you to community resources or hospitals for support, which may include:  Group support.  Education.  Hypnosis.  Therapy.   This information is not intended to replace advice given to you by your health care provider. Make sure you discuss any questions you have with your health care provider.   Document Released: 01/27/2004 Document Revised: 05/21/2014 Document Reviewed: 10/16/2012 Elsevier Interactive Patient Education [employer].

## 2015-12-05 NOTE — Addendum Note (Signed)
Addended by: Marin Olp on: 12/05/2015 10:15 AM   Modules accepted: Orders

## 2015-12-05 NOTE — Telephone Encounter (Signed)
Dr Merian Capron with [facility] wanting to discuss status of patient's ability to work. Please return call.

## 2015-12-06 LAB — HIV ANTIBODY (ROUTINE TESTING W REFLEX): HIV Screen 4th Generation wRfx: NONREACTIVE

## 2015-12-06 LAB — HEPATITIS C ANTIBODY: Hep C Virus Ab: <0.1 s/co ratio (ref 0.0–0.9)

## 2015-12-09 ENCOUNTER — Telehealth: Payer: Self-pay | Admitting: Family Medicine

## 2015-12-09 ENCOUNTER — Telehealth: Payer: Self-pay

## 2015-12-09 ENCOUNTER — Other Ambulatory Visit: Payer: Self-pay | Admitting: Family Medicine

## 2015-12-09 MED ORDER — LEVOFLOXACIN 500 MG PO TABS: 500.0000 mg | ORAL_TABLET | Freq: Every day | ORAL | 0 refills | Status: DC

## 2015-12-09 MED ORDER — BENZONATATE 200 MG PO CAPS: 200.0000 mg | ORAL_CAPSULE | Freq: Three times a day (TID) | ORAL | 0 refills | Status: DC | PRN

## 2015-12-09 NOTE — Telephone Encounter (Signed)
I sent in the requested prescription 

## 2015-12-09 NOTE — Telephone Encounter (Signed)
Pt notified RX was sent into the pharmacy Verbalizes understanding

## 2015-12-09 NOTE — Telephone Encounter (Signed)
The requested med has been sent to the pharmacy.  Please let the patient know. Thanks, WS 

## 2015-12-09 NOTE — Telephone Encounter (Signed)
Pt is having trouble sleeping at night due to cough. Can you send him in a script for cough medicine?

## 2015-12-12 ENCOUNTER — Telehealth: Payer: Self-pay | Admitting: Family Medicine

## 2015-12-12 DIAGNOSIS — Z139 Encounter for screening, unspecified: Secondary | ICD-10-CM

## 2015-12-12 NOTE — Telephone Encounter (Signed)
Pt informed to try Delsym cough syrup Verbalizes understanding

## 2015-12-22 ENCOUNTER — Ambulatory Visit (INDEPENDENT_AMBULATORY_CARE_PROVIDER_SITE_OTHER): Payer: 59 | Admitting: Family Medicine

## 2015-12-22 ENCOUNTER — Encounter: Payer: Self-pay | Admitting: Family Medicine

## 2015-12-22 ENCOUNTER — Ambulatory Visit (INDEPENDENT_AMBULATORY_CARE_PROVIDER_SITE_OTHER): Payer: 59

## 2015-12-22 VITALS — BP 117/78 | HR 98 | Temp 97.5°F | Ht 70.0 in | Wt 197.0 lb

## 2015-12-22 DIAGNOSIS — J441 Chronic obstructive pulmonary disease with (acute) exacerbation: Secondary | ICD-10-CM

## 2015-12-22 DIAGNOSIS — J449 Chronic obstructive pulmonary disease, unspecified: Secondary | ICD-10-CM

## 2015-12-22 MED ORDER — PREDNISONE 50 MG PO TABS: 50.0000 mg | ORAL_TABLET | Freq: Every day | ORAL | 0 refills | Status: DC

## 2015-12-22 MED ORDER — ALBUTEROL SULFATE HFA 108 (90 BASE) MCG/ACT IN AERS: 2.0000 | INHALATION_SPRAY | Freq: Four times a day (QID) | RESPIRATORY_TRACT | 2 refills | Status: DC | PRN

## 2015-12-22 NOTE — Patient Instructions (Signed)
Great to see you!  Try the albuterol at home at least 3 times a day for a few days.   PLease come back if you are not getting better like we expect  Start prednisone early in the day today, it can disturb your sleep so start early.

## 2015-12-22 NOTE — Progress Notes (Signed)
   HPI  Patient presents today with cough and shortness of breath.  Patient has history of COPD, he has stopped smoking about 5 days ago. He had 2 week history of shortness of breath and cough productive of thick sputum. He called in and started Levaquin finishing about one week ago.  States that the sputum changed colors slightly however he did not improve as it pertains to breathing or cough. He has normal appetite. He had chills in the beginning of his illness but has not had any in over 2 weeks. He's tolerating food and fluids normally.  He denies any chest pain  PMH: Smoking status noted ROS: Per HPI  Objective: BP 117/78   Pulse 98   Temp 97.5 F (36.4 C) (Oral)   Ht 5\' 10"  (1.778 m)   Wt 197 lb (89.4 kg)   SpO2 92%   BMI 28.27 kg/m  Gen: NAD, alert, cooperative with exam HEENT: NCAT CV: RRR, good S1/S2, no murmur Resp: Nonlabored, expiratory wheezes throughout, good air movement Ext: No edema, warm Neuro: Alert and oriented, No gross deficits  Assessment and plan:  # COPD exacerbation Treated with prednisone, he has completed a ten-day course of Levaquin. Chest x-ray,  he has had not had significant improvement taking Levaquin, will treat with doxycycline if pneumonia is present. Prednisone Encouraged albuterol, aeration and wheezing improved very well after nebulizer treatment today. Return to clinic as needed.  Congratulated on quitting smoking.      Orders Placed This Encounter  Procedures  . DG Chest 2 View    Standing Status:   Future    Standing Expiration Date:   02/20/2017    Order Specific Question:   Reason for Exam (SYMPTOM  OR DIAGNOSIS REQUIRED)    Answer:   cough, COPD, Eval for CAP    Order Specific Question:   Preferred imaging location?    Answer:   External    Meds ordered this encounter  Medications  . predniSONE (DELTASONE) 50 MG tablet    Sig: Take 1 tablet (50 mg total) by mouth daily with breakfast.    Dispense:  7 tablet      Refill:  0  . albuterol (PROAIR HFA) 108 (90 Base) MCG/ACT inhaler    Sig: Inhale 2 puffs into the lungs every 6 (six) hours as needed for wheezing or shortness of breath.    Dispense:  1 Inhaler    Refill:  [place], MD [place] 12/22/2015, 1:15 PM

## 2015-12-27 ENCOUNTER — Other Ambulatory Visit: Payer: Self-pay | Admitting: Family Medicine

## 2016-01-09 ENCOUNTER — Other Ambulatory Visit: Payer: Self-pay | Admitting: Dermatology

## 2016-01-09 DIAGNOSIS — C4491 Basal cell carcinoma of skin, unspecified: Secondary | ICD-10-CM

## 2016-01-09 DIAGNOSIS — D099 Carcinoma in situ, unspecified: Secondary | ICD-10-CM

## 2016-01-09 HISTORY — DX: Carcinoma in situ, unspecified: D09.9

## 2016-01-09 HISTORY — DX: Basal cell carcinoma of skin, unspecified: C44.91

## 2016-01-19 ENCOUNTER — Encounter: Payer: Self-pay | Admitting: Family Medicine

## 2016-01-19 ENCOUNTER — Ambulatory Visit (INDEPENDENT_AMBULATORY_CARE_PROVIDER_SITE_OTHER): Payer: 59 | Admitting: Family Medicine

## 2016-01-19 VITALS — BP 116/81 | HR 107 | Temp 97.6°F | Ht 70.0 in | Wt 205.6 lb

## 2016-01-19 DIAGNOSIS — J301 Allergic rhinitis due to pollen: Secondary | ICD-10-CM

## 2016-01-19 DIAGNOSIS — J309 Allergic rhinitis, unspecified: Secondary | ICD-10-CM

## 2016-01-19 MED ORDER — TRIAMCINOLONE ACETONIDE 40 MG/ML IJ SUSP
40.0000 mg | Freq: Once | INTRAMUSCULAR | Status: AC
  Administered 2016-01-19: 40 mg via INTRAMUSCULAR

## 2016-01-19 MED ORDER — MONTELUKAST SODIUM 10 MG PO TABS: 10.0000 mg | ORAL_TABLET | Freq: Every day | ORAL | 3 refills | Status: DC

## 2016-01-19 NOTE — Addendum Note (Signed)
Addended by: Nigel Berthold C on: 01/19/2016 11:08 AM   Modules accepted: Orders

## 2016-01-19 NOTE — Patient Instructions (Signed)
Great to see you!  Try allegra or zyrtec daily, also start nasocort 2 sprays per nostril once daily.  Come back if you have any concerns or questions

## 2016-01-19 NOTE — Progress Notes (Signed)
   HPI  Patient presents today here with congestion and cough.  Patient was seen with similar episode about 4 weeks ago, he had improvement in the cough and breathing but has now stated that it's going to "his head". He has nasal congestion, ear popping, frequent throat clearing, and mattering of the eyes.  They've had lots of moisture in their basement and are worried about multiple development. His wife does not have similar symptoms.  He has no fever, chills, sweats. He's tolerating food and fluids normally.    PMH: Smoking status noted ROS: Per HPI  Objective: BP 116/81 (BP Location: Right Arm, Patient Position: Sitting, Cuff Size: Normal)   Pulse (!) 107   Temp 97.6 F (36.4 C) (Oral)   Ht 5\' 10"  (1.778 m)   Wt 205 lb 9.6 oz (93.3 kg)   BMI 29.50 kg/m  Gen: NAD, alert, cooperative with exam HEENT: NCAT, : Turbinates bilaterally with clear mucus on the right, TMs normal with erythema of the right but preserved landmarks, oropharynx erythematous with no exudates CV: RRR, good S1/S2, no murmur Resp: CTABL, no wheezes, non-labored Ext: No edema, warm Neuro: Alert and oriented, No gross deficits  Assessment and plan:  # Seasonal allergies, hayfever, allergic rhinitis Believe he is acutely experiencing his seasonal allergies Treat with steroids, increase aggressiveness of over-the-counter allergic meds- try Zyrtec or Allegra, also add Nasacort Adding singulair as well  # COPD Symptoms seem improved from a breathing standpoint from last visit He's had interval CT scan of the chest for best this exposure Showed atelectasis and some pleural scarring, this is been skin. No signs of infection to cause concern for need of additional antibiotics.    Laroy Apple, MD [facility] 01/19/2016, 10:52 AM

## 2016-02-13 ENCOUNTER — Telehealth: Payer: Self-pay

## 2016-02-13 DIAGNOSIS — H538 Other visual disturbances: Secondary | ICD-10-CM

## 2016-02-13 NOTE — Telephone Encounter (Signed)
Please refer as requested 

## 2016-02-14 NOTE — Telephone Encounter (Signed)
Referral ordered per Dr Livia Snellen.

## 2016-02-16 ENCOUNTER — Encounter: Payer: Self-pay | Admitting: Internal Medicine

## 2016-02-16 ENCOUNTER — Ambulatory Visit (INDEPENDENT_AMBULATORY_CARE_PROVIDER_SITE_OTHER): Payer: 59 | Admitting: Internal Medicine

## 2016-02-16 VITALS — BP 130/84 | HR 99 | Ht 69.0 in | Wt 204.6 lb

## 2016-02-16 DIAGNOSIS — J61 Pneumoconiosis due to asbestos and other mineral fibers: Secondary | ICD-10-CM | POA: Diagnosis not present

## 2016-02-16 DIAGNOSIS — F17210 Nicotine dependence, cigarettes, uncomplicated: Secondary | ICD-10-CM

## 2016-02-16 DIAGNOSIS — J449 Chronic obstructive pulmonary disease, unspecified: Secondary | ICD-10-CM | POA: Diagnosis not present

## 2016-02-16 NOTE — Assessment & Plan Note (Signed)
Last exp known ? 1980s wore respirator thereafter when known exp likely  - CT chest 12/27/15 ? Pleural scarring developing in bases > f/u planned by Asbestos lawyer   Advised no specific treatment implications even if there were more specific findings implicating asbetosis as the cause but even if not the only rx is stop smoking (see separate a/p)

## 2016-02-16 NOTE — Assessment & Plan Note (Addendum)
>   3 min Discussed the risks and costs (both direct and indirect)  of smoking relative to the benefits of quitting but patient unwilling to commit at this point to a specific quit date and note wife also smokes and same problem.   Although I don't endorse regular use of e cigs/ many pts find them helpful; however, I emphasized they should be considered a "one-way bridge" off all tobacco products.

## 2016-02-16 NOTE — Patient Instructions (Addendum)
Plan A = Automatic = symbicort 160 Take 2 puffs first thing in am and then another 2 puffs about 12 hours later.    Work on inhaler technique:  relax and gently blow all the way out then take a nice smooth deep breath back in, triggering the inhaler at same time you start breathing in.  Hold for up to 5 seconds if you can. Blow out thru nose. Rinse and gargle with water when done    Plan B = Backup Only use your albuterol(proair)  as a rescue medication to be used if you can't catch your breath by resting or doing a relaxed purse lip breathing pattern.  - The less you use it, the better it will work when you need it. - Ok to use the inhaler up to 2 puffs  every 4 hours if you must but call for appointment if use goes up over your usual need - Don't leave home without it !!  (think of it like the spare tire for your car)   The key is to stop smoking completely before smoking completely stops you!   Please schedule a follow up visit in 6  months but call sooner if needed

## 2016-02-16 NOTE — Progress Notes (Signed)
Subjective:    Patient ID: Frank Rosales, male    DOB: [birth year]    MRN: [id number]    Brief patient profile:  43 yowm quit smoking 02/12/15 with h/o exposure to Asbestosis at [facility] ? Into the 80s  still able to do flat fine but steps x 3 flights sob with GOLD III copd 2013 prev eval by Dr Joya Gaskins  s/p admit     Admit date: 10/18/2014 Discharge date: 10/21/2014   Discharge Diagnoses:  Acute COPD exacerbation Improving slowly. Continue treatment with steroids, antibiotics, nebulizer treatments.  -10/20/2014--clinically stable on room air -10/20/14--De-escalate to oral steroids -home with prednisone 50mg  daily and decrease by 10mg  daily -Patient has significant occupational exposures that may contribute to chronic lung disease. -I have advised the patient to follow-up with his pulmonologist after he is clinically stable in the outpatient setting. -Ambulatory pulse oximetry did not reveal any desaturation  Left lower lobe pneumonia/community-acquired Continue antibiotics as mentioned above. WBC was normal. -afebrile and hemodynamically stable -home with 4 more days of levofloxaxin 500mg  to complete 7 days of therapy  Syncope Likely vasovagal secondary to coughing spells. Monitor on telemetry. EKG shows sinus rhythm with nonspecific T-wave changes. Nothing acutely ischemic. Patient denies any chest pains. -Telemetry without any concerning dysrhythmia -10/20/2014 echocardiogram shows EF 60%, no WMA, normal RV   11/10/2014  Post hosp f/u/transition of care/ extended ov/establish  Frank Rosales  / advair maint rx / still actively smoking  Chief Complaint  Patient presents with  . Follow-up    Pt states had PNA recently and had to be admitted to hospital from 10/18/14 until 10/21/14. He feels that he is improving and would like to return to work with no restrictions.  He has occ cough with minimal green. He states he only gets SOB walking up 2 flights up stairs.    Ok flat at nl pace, some cough on exp  to dust /fumes on advair but no noct events/ using saba at least twice daily  rec Stop advair  Symbicort 160 Take 2 puffs first thing in am and then another 2 puffs about 12 hours later - clal me if any problem getting it for free  Work on perfecting inhaler technique:    Only use your albuterol (proair) as needed     01/24/2015 f/u ov/Frank Rosales re: GOLD III copd with reversibility/ still smoking  Chief Complaint  Patient presents with  . Follow-up    PFT done today. Pt states that his breathing is unchanged. He rarely uses proair. He is needing pulmonary clearance for back surgery by Dr Rita Ohara.   Cough so hard passed out and hurt back - much worse cough in hot smoky dusty conditions and much worse at work were all of the above apply plus fume exposure.  rec The key is to stop smoking completely before smoking completely stops you!  I do not think you should work in a hot/dusty  Environment especially  with fume exposure  I recommend you seek permanent disability at this point if you can't work in a clean/ cool environment  Work on inhaler technique:    For cough> mucinex DM 1200 mg per day supplement with ultram (tramadol) up to 2 every 4 hours until no longer coughing  Pantoprazole (protonix) 40 mg   Take  30-60 min before first meal of the day and Pepcid (famotidine)  20 mg one @  bedtime until return to office - this is the best way to tell whether stomach acid  is contributing to your problem.   GERD diet  Please schedule a follow up office visit in 4 weeks, sooner if needed  Late add:  Advised cleared for surgery / copy to [place] > deferred by [facility]    02/21/2015  f/u ov/Frank Rosales re: GOLD III / quit smoking around 02/12/15   Chief Complaint  Patient presents with  . Follow-up    Pt states his breathing seems to be improving and he is not coughing as much. He has only used albuterol x 1 since his last visit.   A little coughing each am / not using symbicort immediately in am as rec /  no more presyncope/ no back pain with coughing  rec No change rx   06/16/2015  f/u ov/Frank Rosales re: GOLD III copd/ symbicort 160 2bid / once a week need saba  Chief Complaint  Patient presents with  . Follow-up    Pt states his breathing is overall doing well. He fell mid Dec and fractured 4 ribs. He has occ cough with clear to light green sputum.   no am cough flares  Not limited by breathing from desired activities  But very sedentary due to back / leg pain  rec Congratulations on not smoking/ f/u is as needed    02/16/2016  f/u ov/Frank Rosales re:  GOLD III copd /  symbicort 160 2bid / twice weekly saba  Chief Complaint  Patient presents with  . Follow-up    Pt states had CT Chest done 12/27/15- ordered by worker's comp for eval of previous asbestos exp. He states he had been doing well until July 2017 developed cough and congestion that lasted 2 months, but starting to improve.   sick July - September rx with abx/ steroids better since early Sept 2017 but CT was done in middle of this illness and was abnormal but was done for purpose of screening for asbestos    No obvious day to day or daytime variabilty or assoc excess/ purulent sputum or mucus plugs    cp or chest tightness, subjective wheeze overt sinus or hb symptoms. No unusual exp hx or h/o childhood pna/ asthma or knowledge of premature birth.  Sleeping ok without nocturnal  or early am exacerbation  of respiratory  c/o's or need for noct saba. Also denies any obvious fluctuation of symptoms with weather or environmental changes or other aggravating or alleviating factors except as outlined above   Current Medications, Allergies, Complete Past Medical History, Past Surgical History, Family History, and Social History were reviewed in [employer] record.  ROS  The following are not active complaints unless bolded sore throat, dysphagia, dental problems, itching, sneezing,  nasal congestion or excess/ purulent  secretions, ear ache,   fever, chills, sweats, unintended wt loss, pleuritic or exertional cp, hemoptysis,  orthopnea pnd or leg swelling, presyncope, palpitations, abdominal pain, anorexia, nausea, vomiting, diarrhea  or change in bowel or urinary habits, change in stools or urine, dysuria,hematuria,  rash, arthralgias, visual complaints, headache, numbness down R leg with no change back pain  weakness or ataxia or problems with walking or coordination,  change in mood/affect or memory.              Objective:   Physical Exam  amb slt hoarse wm     01/24/2015        180 > 02/21/2015  180  > 06/16/2015  186  > 02/16/2016  205     11/10/14 174 lb (78.926 kg)  11/08/14 176 lb 6.4 oz (80.015 kg)  10/27/14 175 lb (79.379 kg)    Vital signs reviewed  - sats 93% RA on arrival   Gen: Pleasant, well-nourished, in no distress,  normal affect  ENT: No lesions,  mouth clear,  oropharynx clear, no postnasal drip  Neck: No JVD, no TMG, no carotid bruits  Lungs: No use of accessory muscles, no dullness to percussion, distant BS,  Very Min insp and exp rhonchi mostly upper airway   Cardiovascular: RRR, heart sounds normal, no murmur or gallops, no peripheral edema  Abdomen: soft and NT, no HSM,  BS normal  Musculoskeletal: No deformities, no cyanosis or clubbing  Neuro: alert, non focal  Skin: Warm, no lesions or rashes   CT chest 12/27/15 report reviewed: Bibasilar scarring worse with ? Localized pleural thickening  Assessment & Plan:

## 2016-02-16 NOTE — Assessment & Plan Note (Signed)
Spirometry 03/2011 FeV1 49%    Spirometry  02/28/2012  FEV1 45%   - Last day worked = October 13 2014  - PFT's  01/24/2015  FEV1 1.59  (43 % ) ratio 54   p 22 % improvement from saba with DLCO  88 % corrects to 99 % for alv volume s symbicort - Quit smoking 02/12/15   - PFTs  12/27/15 1.30 (35%) ratio 35  In midst of a flare- - 02/16/2016  After extensive coaching HFA effectiveness =    75%   Severe copd with AB component best rx is laba/ics with lama added if more limited with activities, more freq exacerbations or need for saba which he denies at present so no change rx other than work harder on hfa technique and d/c cigs (see separate a/p)

## 2016-02-20 ENCOUNTER — Telehealth: Payer: Self-pay | Admitting: Family Medicine

## 2016-02-20 NOTE — Telephone Encounter (Signed)
Note faxed to Debbie.

## 2016-03-01 ENCOUNTER — Other Ambulatory Visit: Payer: Self-pay

## 2016-03-01 ENCOUNTER — Other Ambulatory Visit: Payer: Self-pay | Admitting: Dermatology

## 2016-03-01 MED ORDER — MIRTAZAPINE 30 MG PO TABS: 30.0000 mg | ORAL_TABLET | Freq: Every day | ORAL | 0 refills | Status: DC

## 2016-03-07 ENCOUNTER — Ambulatory Visit (INDEPENDENT_AMBULATORY_CARE_PROVIDER_SITE_OTHER): Payer: 59 | Admitting: Family Medicine

## 2016-03-07 ENCOUNTER — Encounter: Payer: Self-pay | Admitting: Family Medicine

## 2016-03-07 VITALS — BP 123/78 | HR 96 | Temp 98.3°F | Ht 69.0 in | Wt 208.1 lb

## 2016-03-07 DIAGNOSIS — E039 Hypothyroidism, unspecified: Secondary | ICD-10-CM

## 2016-03-07 DIAGNOSIS — E7800 Pure hypercholesterolemia, unspecified: Secondary | ICD-10-CM | POA: Diagnosis not present

## 2016-03-07 DIAGNOSIS — Z23 Encounter for immunization: Secondary | ICD-10-CM | POA: Diagnosis not present

## 2016-03-07 DIAGNOSIS — I10 Essential (primary) hypertension: Secondary | ICD-10-CM

## 2016-03-07 DIAGNOSIS — J449 Chronic obstructive pulmonary disease, unspecified: Secondary | ICD-10-CM | POA: Diagnosis not present

## 2016-03-07 MED ORDER — MIRTAZAPINE 45 MG PO TABS: 45.0000 mg | ORAL_TABLET | Freq: Every day | ORAL | 5 refills | Status: DC

## 2016-03-07 NOTE — Progress Notes (Signed)
Subjective:  Patient ID: Frank Rosales, male    DOB: November 17, 1955  Age: 60 y.o. MRN: [id number]  CC: Hypothyroidism (pt here today for routine follow up on Thyroid, and getting flu shot)   HPI OVAL CAVAZOS presents for  follow-up of hypertension. Patient has no history of headache chest pain or shortness of breath or recent cough. Patient also denies symptoms of TIA such as numbness weakness lateralizing. Patient checks  blood pressure at home and has not had any elevated readings recently. Patient denies side effects from his medication. States taking it regularly.  Patient also  in for follow-up of elevated cholesterol. Currently no chest pain, shortness of breath or other cardiovascular related symptoms noted. Not currently taking med for cholesterol.  Depression screen [facility] 2/9 03/07/2016 01/19/2016 12/22/2015 12/05/2015 09/01/2015  Decreased Interest 0 0 0 0 0  Down, Depressed, Hopeless 0 0 0 0 0  PHQ - 2 Score 0 0 0 0 0  Altered sleeping - - - 0 -  Tired, decreased energy - - - 0 -  Change in appetite - - - 0 -  Feeling bad or failure about yourself  - - - 0 -  Trouble concentrating - - - 0 -  Moving slowly or fidgety/restless - - - 0 -  Suicidal thoughts - - - 0 -  PHQ-9 Score - - - 0 -   "Good days and bad days." Still waking up every couple of hours overnight.(Mirtazapine does not cause any hangover.  Patient is being followed by Dr. Christinia Gully for his COPD. Recent note reviewed. Dr. Melvyn Novas is continuing to emphasize smoking cessation. Patient states he's down to 5 a day now.  History Jeffey has a past medical history of Anxiety; Arthritis; Asthma; Colon polyps; COPD (chronic obstructive pulmonary disease) ([place]); High blood pressure; High cholesterol; Hilar adenopathy; and Hypothyroidism.   He has a past surgical history that includes Knee arthroscopy (Right, 05/14/2010); Wisdom tooth extraction; epidural injections; Fixation kyphoplasty lumbar spine; and Back surgery.   His  family history includes Emphysema in his father; Heart disease in his brother; Heart failure in his mother; Lung cancer in his father.He reports that he has been smoking Cigarettes.  He started smoking about 42 years ago. He has a 70.00 pack-year smoking history. He has never used smokeless tobacco. He reports that he drinks about 25.2 oz of alcohol per week . He reports that he does not use drugs.  Current Outpatient Prescriptions on File Prior to Visit  Medication Sig Dispense Refill  . albuterol (PROAIR HFA) 108 (90 Base) MCG/ACT inhaler Inhale 2 puffs into the lungs every 6 (six) hours as needed for wheezing or shortness of breath. 1 Inhaler 2  . aspirin 81 MG tablet Take 81 mg by mouth daily. Hold's medication prior to spinal injections    . budesonide-formoterol (SYMBICORT) 160-4.5 MCG/ACT inhaler Take 2 puffs first thing in am and then another 2 puffs about 12 hours later. 3 Inhaler 2  . cholecalciferol (VITAMIN D) 1000 UNITS tablet Take 1,000 Units by mouth daily.     . folic acid (FOLVITE) 1 MG tablet Take 1 tablet (1 mg total) by mouth daily. 90 tablet 3  . KRILL OIL PO Take 1 Can by mouth daily.    Marland Kitchen levothyroxine (SYNTHROID, LEVOTHROID) 50 MCG tablet Take 1 tablet (50 mcg total) by mouth daily before breakfast. Wait 1 hour after taking before eating or drinking anything but water. 90 tablet 3  . NIFEdipine (PROCARDIA  XL/ADALAT-CC) 90 MG 24 hr tablet TAKE 1 TABLET (90 MG TOTAL) BY MOUTH DAILY. 90 tablet 0  . valsartan-hydrochlorothiazide (DIOVAN-HCT) 320-25 MG tablet Take 1 tablet by mouth daily. 90 tablet 1  . vitamin B-12 (CYANOCOBALAMIN) 250 MCG tablet Take 250 mcg by mouth daily.     No current facility-administered medications on file prior to visit.     ROS Review of Systems  Constitutional: Negative for chills, diaphoresis, fever and unexpected weight change.  HENT: Negative for congestion, hearing loss, rhinorrhea and sore throat.   Eyes: Negative for visual disturbance.    Respiratory: Positive for shortness of breath (with exertion) and wheezing. Negative for cough.   Cardiovascular: Negative for chest pain.  Gastrointestinal: Negative for abdominal pain, constipation and diarrhea.  Genitourinary: Negative for dysuria and flank pain.  Musculoskeletal: Negative for arthralgias and joint swelling.  Skin: Negative for rash.  Neurological: Negative for dizziness and headaches.  Psychiatric/Behavioral: Negative for dysphoric mood and sleep disturbance.    Objective:  BP 123/78   Pulse 96   Temp 98.3 F (36.8 C) (Oral)   Ht 5' 9"  (1.753 m)   Wt 208 lb 2 oz (94.4 kg)   SpO2 95%   BMI 30.73 kg/m   BP Readings from Last 3 Encounters:  03/07/16 123/78  02/16/16 130/84  01/19/16 116/81    Wt Readings from Last 3 Encounters:  03/07/16 208 lb 2 oz (94.4 kg)  02/16/16 204 lb 9.6 oz (92.8 kg)  01/19/16 205 lb 9.6 oz (93.3 kg)     Physical Exam  Constitutional: He is oriented to person, place, and time. He appears well-developed and well-nourished. No distress.  HENT:  Head: Normocephalic and atraumatic.  Right Ear: External ear normal.  Left Ear: External ear normal.  Nose: Nose normal.  Mouth/Throat: Oropharynx is clear and moist.  Eyes: Conjunctivae and EOM are normal. Pupils are equal, round, and reactive to light.  Neck: Normal range of motion. Neck supple. No thyromegaly present.  Cardiovascular: Normal rate, regular rhythm and normal heart sounds.   No murmur heard. Pulmonary/Chest: Effort normal. No respiratory distress. He has wheezes. He has no rales.  Abdominal: Soft. Bowel sounds are normal. He exhibits no distension. There is no tenderness.  Lymphadenopathy:    He has no cervical adenopathy.  Neurological: He is alert and oriented to person, place, and time. He has normal reflexes.  Skin: Skin is warm and dry.  Psychiatric: His speech is normal and behavior is normal. Judgment and thought content normal. His affect is blunt.  Cognition and memory are normal. He exhibits a depressed mood.    Lab Results  Component Value Date   HGBA1C 5.2 04/11/2015    Lab Results  Component Value Date   WBC 7.5 09/01/2015   HGB 14.5 05/01/2015   HCT 50.8 09/01/2015   PLT 227 09/01/2015   GLUCOSE 113 (H) 09/01/2015   CHOL 197 09/01/2015   TRIG 75 09/01/2015   HDL 70 09/01/2015   LDLCALC 112 (H) 09/01/2015   ALT 31 09/01/2015   AST 21 09/01/2015   NA 136 09/01/2015   K 4.4 09/01/2015   CL 95 (L) 09/01/2015   CREATININE 0.87 09/01/2015   BUN 8 09/01/2015   CO2 23 09/01/2015   TSH 1.710 09/01/2015   PSA 0.9 02/06/2013   INR 0.98 10/20/2010   HGBA1C 5.2 04/11/2015    Dg Chest 2 View  Result Date: 05/01/2015 CLINICAL DATA:  Fall. EXAM: CHEST  2 VIEW COMPARISON:  05/01/2015  FINDINGS: Normal heart size. No pleural effusion identified. The previously noted tiny right apical pneumothorax is not visualized. There is atelectasis in the right base with adjacent rib fractures. Subcutaneous emphysema within right lateral chest wall and right supraclavicular region is again noted. IMPRESSION: 1. No change in right chest wall subcutaneous emphysema. 2. Small right apical pneumothorax is no longer visualized. 3. Right base atelectasis. Electronically Signed   By: Kerby Moors M.D.   On: 05/01/2015 16:07   Dg Chest Port 1 View  Result Date: 05/02/2015 CLINICAL DATA:  Evaluate right-sided pneumothorax. EXAM: PORTABLE CHEST 1 VIEW COMPARISON:  05/01/2015; 10/27/2014 FINDINGS: Grossly unchanged cardiac silhouette and mediastinal contours given persistently reduced lung volumes. Grossly unchanged bilateral infrahilar opacities, right greater than left. No new focal airspace opacities. Unchanged trace of sided effusion. The amount of right lateral chest wall subcutaneous emphysema has minimally decreased in the interval. No pneumothorax. Known minimally displaced ight-sided posterior lateral rib fractures suboptimally evaluated.  IMPRESSION: 1. The amount of right lateral chest wall subcutaneous emphysema is minimally decreased in the interval. No pneumothorax. 2. Grossly unchanged findings of hypoventilation and bilateral infrahilar opacities, likely atelectasis. Electronically Signed   By: Sandi Mariscal M.D.   On: 05/02/2015 07:37    Assessment & Plan:   Dawson was seen today for hypothyroidism.  Diagnoses and all orders for this visit:  COPD GOLD III  -     CBC with Differential/Platelet -     CMP14+EGFR  Essential hypertension -     CBC with Differential/Platelet -     CMP14+EGFR  High cholesterol -     CBC with Differential/Platelet -     CMP14+EGFR -     Lipid panel  Hypothyroidism, unspecified type -     CBC with Differential/Platelet -     CMP14+EGFR -     TSH + free T4  Other orders -     mirtazapine (REMERON) 45 MG tablet; Take 1 tablet (45 mg total) by mouth at bedtime. For sleep   I have changed Mr. Molyneux mirtazapine. I am also having him maintain his aspirin, cholecalciferol, vitamin H-74, folic acid, KRILL OIL PO, budesonide-formoterol, NIFEdipine, levothyroxine, valsartan-hydrochlorothiazide, albuterol, and BESIVANCE.  Meds ordered this encounter  Medications  . BESIVANCE 0.6 % SUSP  . mirtazapine (REMERON) 45 MG tablet    Sig: Take 1 tablet (45 mg total) by mouth at bedtime. For sleep    Dispense:  30 tablet    Refill:  5   Discussed in detail a plan for smoking cessation. He should cut back immediately if 4 cigarettes a day for 3 days then taper to 3 a day to day and one a day 3 days each. Count at his cigarettes the night before. Keep his supply and a safe and hard to access place. Contract with himself to not borrow, bum, purchase cigarettes daily from that main supply.  Flu shot given.  Follow-up: Return in about 6 months (around 09/05/2016) for COPD, cholesterol, Depression, hypertension, Hypothyroidism.  Claretta Fraise, M.D.

## 2016-03-08 ENCOUNTER — Other Ambulatory Visit: Payer: Self-pay | Admitting: *Deleted

## 2016-03-08 LAB — CMP14+EGFR
A/G RATIO: 1.6 (ref 1.2–2.2)
ALBUMIN: 4.6 g/dL (ref 3.5–5.5)
ALT: 33 IU/L (ref 0–44)
AST: 26 IU/L (ref 0–40)
Alkaline Phosphatase: 95 IU/L (ref 39–117)
BILIRUBIN TOTAL: 0.8 mg/dL (ref 0.0–1.2)
BUN / CREAT RATIO: 7 — AB (ref 9–20)
BUN: 7 mg/dL (ref 6–24)
CHLORIDE: 94 mmol/L — AB (ref 96–106)
CO2: 26 mmol/L (ref 18–29)
Calcium: 10 mg/dL (ref 8.7–10.2)
Creatinine, Ser: 0.94 mg/dL (ref 0.76–1.27)
GFR calc non Af Amer: 89 mL/min/{1.73_m2} (ref >59)
GFR, EST AFRICAN AMERICAN: 103 mL/min/{1.73_m2} (ref >59)
GLUCOSE: 129 mg/dL — AB (ref 65–99)
Globulin, Total: 2.8 g/dL (ref 1.5–4.5)
Potassium: 4.1 mmol/L (ref 3.5–5.2)
Sodium: 136 mmol/L (ref 134–144)
TOTAL PROTEIN: 7.4 g/dL (ref 6.0–8.5)

## 2016-03-08 LAB — CBC WITH DIFFERENTIAL/PLATELET
BASOS: 1 %
Basophils Absolute: 0.1 10*3/uL (ref 0.0–0.2)
EOS (ABSOLUTE): 0.1 10*3/uL (ref 0.0–0.4)
Eos: 1 %
HEMATOCRIT: 47.5 % (ref 37.5–51.0)
HEMOGLOBIN: 17.5 g/dL (ref 12.6–17.7)
Immature Grans (Abs): 0 10*3/uL (ref 0.0–0.1)
Immature Granulocytes: 0 %
LYMPHS ABS: 1.7 10*3/uL (ref 0.7–3.1)
Lymphs: 20 %
MCH: 36.2 pg — AB (ref 26.6–33.0)
MCHC: 36.8 g/dL — ABNORMAL HIGH (ref 31.5–35.7)
MCV: 98 fL — AB (ref 79–97)
MONOS ABS: 0.8 10*3/uL (ref 0.1–0.9)
Monocytes: 10 %
NEUTROS ABS: 5.9 10*3/uL (ref 1.4–7.0)
Neutrophils: 68 %
Platelets: 256 10*3/uL (ref 150–379)
RBC: 4.84 x10E6/uL (ref 4.14–5.80)
RDW: 15.3 % (ref 12.3–15.4)
WBC: 8.5 10*3/uL (ref 3.4–10.8)

## 2016-03-08 LAB — LIPID PANEL
CHOL/HDL RATIO: 4 ratio (ref 0.0–5.0)
Cholesterol, Total: 197 mg/dL (ref 100–199)
HDL: 49 mg/dL (ref >39)
LDL CALC: 118 mg/dL — AB (ref 0–99)
TRIGLYCERIDES: 151 mg/dL — AB (ref 0–149)
VLDL Cholesterol Cal: 30 mg/dL (ref 5–40)

## 2016-03-08 LAB — TSH+FREE T4
Free T4: 1.45 ng/dL (ref 0.82–1.77)
TSH: 2.01 u[IU]/mL (ref 0.450–4.500)

## 2016-03-08 MED ORDER — MIRTAZAPINE 45 MG PO TABS: 45.0000 mg | ORAL_TABLET | Freq: Every day | ORAL | 1 refills | Status: DC

## 2016-03-09 ENCOUNTER — Other Ambulatory Visit: Payer: Self-pay | Admitting: *Deleted

## 2016-03-09 DIAGNOSIS — R7309 Other abnormal glucose: Secondary | ICD-10-CM

## 2016-03-13 LAB — SPECIMEN STATUS REPORT

## 2016-03-13 LAB — HGB A1C W/O EAG: HEMOGLOBIN A1C: 5.9 % — AB (ref 4.8–5.6)

## 2016-03-19 ENCOUNTER — Other Ambulatory Visit: Payer: Self-pay | Admitting: *Deleted

## 2016-03-19 NOTE — Telephone Encounter (Signed)
Received fax for montelukast 10mg  and I do not see on medication list. Also received fax for mirtazapine 30mg  and we have down that he is on 45mg . I have called patient to verify request.

## 2016-03-20 ENCOUNTER — Other Ambulatory Visit: Payer: Self-pay | Admitting: *Deleted

## 2016-03-20 MED ORDER — MIRTAZAPINE 45 MG PO TABS: 45.0000 mg | ORAL_TABLET | Freq: Every day | ORAL | 1 refills | Status: DC

## 2016-03-21 ENCOUNTER — Other Ambulatory Visit: Payer: Self-pay | Admitting: *Deleted

## 2016-03-21 MED ORDER — MONTELUKAST SODIUM 10 MG PO TABS: 10.0000 mg | ORAL_TABLET | Freq: Every day | ORAL | 1 refills | Status: DC

## 2016-04-10 ENCOUNTER — Other Ambulatory Visit: Payer: Self-pay | Admitting: Family Medicine

## 2016-04-10 DIAGNOSIS — J449 Chronic obstructive pulmonary disease, unspecified: Secondary | ICD-10-CM

## 2016-04-13 DIAGNOSIS — J45909 Unspecified asthma, uncomplicated: Secondary | ICD-10-CM | POA: Insufficient documentation

## 2016-04-17 ENCOUNTER — Other Ambulatory Visit: Payer: Self-pay | Admitting: Family Medicine

## 2016-04-23 ENCOUNTER — Telehealth: Payer: Self-pay

## 2016-04-23 ENCOUNTER — Other Ambulatory Visit: Payer: Self-pay | Admitting: Family Medicine

## 2016-04-23 MED ORDER — UMECLIDINIUM-VILANTEROL 62.5-25 MCG/INH IN AEPB: INHALATION_SPRAY | RESPIRATORY_TRACT | 11 refills | Status: DC

## 2016-04-23 NOTE — Telephone Encounter (Signed)
I sent in anoro. Hopefully it will go through.

## 2016-04-23 NOTE — Telephone Encounter (Signed)
Left message regarding new medication

## 2016-05-02 DIAGNOSIS — G4733 Obstructive sleep apnea (adult) (pediatric): Secondary | ICD-10-CM | POA: Insufficient documentation

## 2016-06-02 ENCOUNTER — Other Ambulatory Visit: Payer: Self-pay | Admitting: Family Medicine

## 2016-06-25 ENCOUNTER — Ambulatory Visit (INDEPENDENT_AMBULATORY_CARE_PROVIDER_SITE_OTHER): Payer: 59 | Admitting: Family Medicine

## 2016-06-25 ENCOUNTER — Other Ambulatory Visit: Payer: Self-pay

## 2016-06-25 ENCOUNTER — Encounter: Payer: Self-pay | Admitting: Family Medicine

## 2016-06-25 ENCOUNTER — Ambulatory Visit (INDEPENDENT_AMBULATORY_CARE_PROVIDER_SITE_OTHER): Payer: 59

## 2016-06-25 ENCOUNTER — Ambulatory Visit (HOSPITAL_COMMUNITY)
Admission: RE | Admit: 2016-06-25 | Discharge: 2016-06-25 | Disposition: A | Discharge status: Home / Self Care | Payer: 59 | Source: Ambulatory Visit | Attending: Family Medicine | Admitting: Family Medicine

## 2016-06-25 ENCOUNTER — Telehealth: Payer: Self-pay | Admitting: Family Medicine

## 2016-06-25 VITALS — BP 126/74 | HR 106 | Temp 97.5°F | Ht 69.0 in | Wt 205.0 lb

## 2016-06-25 DIAGNOSIS — M79605 Pain in left leg: Principal | ICD-10-CM

## 2016-06-25 DIAGNOSIS — I87302 Chronic venous hypertension (idiopathic) without complications of left lower extremity: Secondary | ICD-10-CM | POA: Diagnosis not present

## 2016-06-25 DIAGNOSIS — R600 Localized edema: Secondary | ICD-10-CM

## 2016-06-25 DIAGNOSIS — M79604 Pain in right leg: Secondary | ICD-10-CM

## 2016-06-25 MED ORDER — CIPROFLOXACIN HCL 500 MG PO TABS: 500.0000 mg | ORAL_TABLET | Freq: Two times a day (BID) | ORAL | 0 refills | Status: DC

## 2016-06-25 NOTE — Progress Notes (Signed)
Subjective:  Patient ID: Frank Rosales, male    DOB: February 28, 1956  Age: 61 y.o. MRN: [id number]  CC: Knee Pain (pt here today c/o left knee pain after falling on cement about 2 weeks ago. The left knee and left shin area is red and swollen.)   HPI Frank Rosales presents for Redness and swelling in the left lower leg. He fell 2 weeks ago and hit the left knee on concrete. He had some pain with that but had been managing until a couple of days ago he started noticing redness. The redness spreads down the anterior knee to the ankle. It has become more sore and more swollen since that time. He is a smoker with a history of severe COPD. Exercise tolerance is limited. However he does not have a limp from this injury.   History Frank Rosales has a past medical history of Anxiety; Arthritis; Asthma; Colon polyps; COPD (chronic obstructive pulmonary disease) ([place]); High blood pressure; High cholesterol; Hilar adenopathy; and Hypothyroidism.   He has a past surgical history that includes Knee arthroscopy (Right, 05/14/2010); Wisdom tooth extraction; epidural injections; Fixation kyphoplasty lumbar spine; and Back surgery.   His family history includes Emphysema in his father; Heart disease in his brother; Heart failure in his mother; Lung cancer in his father.He reports that he has been smoking Cigarettes.  He started smoking about 43 years ago. He has a 70.00 pack-year smoking history. He has never used smokeless tobacco. He reports that he drinks about 25.2 oz of alcohol per week . He reports that he does not use drugs.    ROS Review of Systems  Constitutional: Negative for chills, diaphoresis, fever and unexpected weight change.  HENT: Negative for congestion, hearing loss, rhinorrhea and sore throat.   Eyes: Negative for visual disturbance.  Respiratory: Positive for shortness of breath. Negative for cough.   Cardiovascular: Negative for chest pain.  Gastrointestinal: Negative for abdominal pain,  constipation and diarrhea.  Genitourinary: Negative for dysuria and flank pain.  Musculoskeletal: Positive for arthralgias and joint swelling.  Skin: Positive for rash.  Neurological: Negative for dizziness and headaches.  Psychiatric/Behavioral: Negative for dysphoric mood and sleep disturbance.    Objective:  BP 126/74   Pulse (!) 106   Temp 97.5 F (36.4 C) (Oral)   Ht 5\' 9"  (1.753 m)   Wt 205 lb (93 kg)   BMI 30.27 kg/m   BP Readings from Last 3 Encounters:  06/25/16 126/74  03/07/16 123/78  02/16/16 130/84    Wt Readings from Last 3 Encounters:  06/25/16 205 lb (93 kg)  03/07/16 208 lb 2 oz (94.4 kg)  02/16/16 204 lb 9.6 oz (92.8 kg)     Physical Exam  Constitutional: He is oriented to person, place, and time. He appears well-developed and well-nourished. No distress.  HENT:  Head: Normocephalic and atraumatic.  Right Ear: External ear normal.  Left Ear: External ear normal.  Nose: Nose normal.  Mouth/Throat: Oropharynx is clear and moist.  Eyes: Conjunctivae and EOM are normal. Pupils are equal, round, and reactive to light.  Neck: Normal range of motion. Neck supple. No thyromegaly present.  Cardiovascular: Normal rate, regular rhythm and normal heart sounds.   No murmur heard. Pulmonary/Chest: Effort normal and breath sounds normal. No respiratory distress. He has no wheezes. He has no rales.  Abdominal: Soft. Bowel sounds are normal. He exhibits no distension. There is no tenderness.  Musculoskeletal: Normal range of motion. He exhibits edema (3+ at the left  calf).  Lymphadenopathy:    He has no cervical adenopathy.  Neurological: He is alert and oriented to person, place, and time. He has normal reflexes.  Skin: Skin is warm and dry. Rash (at the left lr extremity there is moderate erythema extending from the tibial tuberosity to the malleoli) noted. There is erythema.  Psychiatric: He has a normal mood and affect. His behavior is normal. Judgment and  thought content normal.      Assessment & Plan:   Frank Rosales was seen today for knee pain.  Diagnoses and all orders for this visit:  Edema of leg -     DG Knee 1-2 Views Left; Future -     Cancel: Ultrasound doppler venous legs bilat; Future -     Ultrasound doppler venous legs bilat; Future  Stasis edema of left lower extremity -     Cancel: Ultrasound doppler venous legs bilat; Future -     Ultrasound doppler venous legs bilat; Future  Other orders -     ciprofloxacin (CIPRO) 500 MG tablet; Take 1 tablet (500 mg total) by mouth 2 (two) times daily.   X-ray shows arthritis, venous Doppler is done stat and negative for DVT  I have discontinued Mr. Gamlin mirtazapine, montelukast, and umeclidinium-vilanterol. I am also having him start on ciprofloxacin. Additionally, I am having him maintain his aspirin, cholecalciferol, vitamin 0000000, folic acid, KRILL OIL PO, levothyroxine, albuterol, BESIVANCE, budesonide-formoterol, NIFEdipine, and valsartan-hydrochlorothiazide.  Allergies as of 06/25/2016   No Known Allergies     Medication List       Accurate as of 06/25/16 11:59 AM. Always use your most recent med list.          albuterol 108 (90 Base) MCG/ACT inhaler Commonly known as:  PROAIR HFA Inhale 2 puffs into the lungs every 6 (six) hours as needed for wheezing or shortness of breath.   aspirin 81 MG tablet Take 81 mg by mouth daily. Hold's medication prior to spinal injections   BESIVANCE 0.6 % Susp Generic drug:  Besifloxacin HCl   budesonide-formoterol 160-4.5 MCG/ACT inhaler Commonly known as:  SYMBICORT 2 puffs bid   cholecalciferol 1000 units tablet Commonly known as:  VITAMIN D Take 1,000 Units by mouth daily.   ciprofloxacin 500 MG tablet Commonly known as:  CIPRO Take 1 tablet (500 mg total) by mouth 2 (two) times daily.   folic acid 1 MG tablet Commonly known as:  FOLVITE Take 1 tablet (1 mg total) by mouth daily.   KRILL OIL PO Take 1 Can by  mouth daily.   levothyroxine 50 MCG tablet Commonly known as:  SYNTHROID, LEVOTHROID Take 1 tablet (50 mcg total) by mouth daily before breakfast. Wait 1 hour after taking before eating or drinking anything but water.   NIFEdipine 90 MG 24 hr tablet Commonly known as:  PROCARDIA XL/ADALAT-CC TAKE 1 TABLET (90 MG TOTAL) BY MOUTH DAILY.   valsartan-hydrochlorothiazide 320-25 MG tablet Commonly known as:  DIOVAN-HCT TAKE 1 TABLET BY MOUTH DAILY.   vitamin B-12 250 MCG tablet Commonly known as:  CYANOCOBALAMIN Take 250 mcg by mouth daily.        Follow-up: Return in about 1 week (around 07/02/2016).  Claretta Fraise, M.D.

## 2016-06-25 NOTE — Progress Notes (Signed)
Please contact the patient regarding: XR shows arthritis, but no fracture

## 2016-07-02 ENCOUNTER — Telehealth: Payer: Self-pay | Admitting: Family Medicine

## 2016-07-03 ENCOUNTER — Encounter: Payer: Self-pay | Admitting: Family Medicine

## 2016-07-03 ENCOUNTER — Ambulatory Visit (INDEPENDENT_AMBULATORY_CARE_PROVIDER_SITE_OTHER): Payer: 59 | Admitting: Family Medicine

## 2016-07-03 VITALS — BP 130/85 | HR 99 | Temp 97.6°F | Ht 69.0 in | Wt 198.6 lb

## 2016-07-03 DIAGNOSIS — I872 Venous insufficiency (chronic) (peripheral): Secondary | ICD-10-CM

## 2016-07-03 NOTE — Progress Notes (Signed)
Subjective:  Patient ID: Frank Rosales, male    DOB: Dec 18, 1955  Age: 61 y.o. MRN: [id number]  CC: Leg Swelling (followup from last week)   HPI Frank Rosales presents for follow up of Redness and swelling in the left lower leg.The redness that had spread down the anterior knee to the ankle. has resolved. It is no longer swollen He is a smoker with a history of severe COPD.  History Frank Rosales has a past medical history of Anxiety; Arthritis; Asthma; Colon polyps; COPD (chronic obstructive pulmonary disease) ([place]); High blood pressure; High cholesterol; Hilar adenopathy; and Hypothyroidism.   He has a past surgical history that includes Knee arthroscopy (Right, 05/14/2010); Wisdom tooth extraction; epidural injections; Fixation kyphoplasty lumbar spine; and Back surgery.   His family history includes Emphysema in his father; Heart disease in his brother; Heart failure in his mother; Lung cancer in his father.He reports that he has been smoking Cigarettes.  He started smoking about 43 years ago. He has a 70.00 pack-year smoking history. He has never used smokeless tobacco. He reports that he drinks about 25.2 oz of alcohol per week . He reports that he does not use drugs.    ROS Review of Systems  Constitutional: Negative for chills, diaphoresis, fever and unexpected weight change.  HENT: Negative for congestion, hearing loss, rhinorrhea and sore throat.   Eyes: Negative for visual disturbance.  Musculoskeletal: Positive for arthralgias and joint swelling (some at the left knee).  Skin: Positive for rash.  Psychiatric/Behavioral: Negative for dysphoric mood and sleep disturbance.    Objective:  BP 130/85   Pulse 99   Temp 97.6 F (36.4 C) (Oral)   Ht 5\' 9"  (1.753 m)   Wt 198 lb 9.6 oz (90.1 kg)   BMI 29.33 kg/m   BP Readings from Last 3 Encounters:  07/03/16 130/85  06/25/16 126/74  03/07/16 123/78    Wt Readings from Last 3 Encounters:  07/03/16 198 lb 9.6 oz (90.1 kg)    06/25/16 205 lb (93 kg)  03/07/16 208 lb 2 oz (94.4 kg)     Physical Exam  Constitutional: He is oriented to person, place, and time. He appears well-developed and well-nourished. No distress.  HENT:  Head: Normocephalic and atraumatic.  Eyes: Conjunctivae and EOM are normal.  Neck: Normal range of motion.  Cardiovascular: Normal rate, regular rhythm and normal heart sounds.   No murmur heard. Pulmonary/Chest: Effort normal and breath sounds normal. No respiratory distress.  Musculoskeletal: Normal range of motion. He exhibits edema (1+ left leg. Focal edema at tibial tuberosity).  Neurological: He is alert and oriented to person, place, and time. He has normal reflexes.  Skin: Skin is warm and dry. No rash noted. No erythema.  Psychiatric: He has a normal mood and affect. His behavior is normal. Thought content normal.      Assessment & Plan:   Frank Rosales was seen today for leg swelling.  Diagnoses and all orders for this visit:  Venous insufficiency of left leg -     Compression stockings   X-ray shows arthritis, venous Doppler is done stat and negative for DVT  I have discontinued Frank Rosales ciprofloxacin. I am also having him maintain his aspirin, cholecalciferol, vitamin 0000000, folic acid, KRILL OIL PO, levothyroxine, albuterol, BESIVANCE, budesonide-formoterol, NIFEdipine, and valsartan-hydrochlorothiazide.  Allergies as of 07/03/2016   No Known Allergies     Medication List       Accurate as of 07/03/16  2:20 PM. Always use your  most recent med list.          albuterol 108 (90 Base) MCG/ACT inhaler Commonly known as:  PROAIR HFA Inhale 2 puffs into the lungs every 6 (six) hours as needed for wheezing or shortness of breath.   aspirin 81 MG tablet Take 81 mg by mouth daily. Hold's medication prior to spinal injections   BESIVANCE 0.6 % Susp Generic drug:  Besifloxacin HCl   budesonide-formoterol 160-4.5 MCG/ACT inhaler Commonly known as:  SYMBICORT 2  puffs bid   cholecalciferol 1000 units tablet Commonly known as:  VITAMIN D Take 1,000 Units by mouth daily.   folic acid 1 MG tablet Commonly known as:  FOLVITE Take 1 tablet (1 mg total) by mouth daily.   KRILL OIL PO Take 1 Can by mouth daily.   levothyroxine 50 MCG tablet Commonly known as:  SYNTHROID, LEVOTHROID Take 1 tablet (50 mcg total) by mouth daily before breakfast. Wait 1 hour after taking before eating or drinking anything but water.   NIFEdipine 90 MG 24 hr tablet Commonly known as:  PROCARDIA XL/ADALAT-CC TAKE 1 TABLET (90 MG TOTAL) BY MOUTH DAILY.   valsartan-hydrochlorothiazide 320-25 MG tablet Commonly known as:  DIOVAN-HCT TAKE 1 TABLET BY MOUTH DAILY.   vitamin B-12 250 MCG tablet Commonly known as:  CYANOCOBALAMIN Take 250 mcg by mouth daily.        Follow-up: Return in about 2 months (around 08/31/2016).  Claretta Fraise, M.D.

## 2016-07-09 ENCOUNTER — Encounter: Payer: Self-pay | Admitting: Internal Medicine

## 2016-08-05 ENCOUNTER — Encounter (HOSPITAL_COMMUNITY): Payer: Self-pay | Admitting: Emergency Medicine

## 2016-08-05 ENCOUNTER — Emergency Department (HOSPITAL_COMMUNITY): Payer: 59

## 2016-08-05 ENCOUNTER — Inpatient Hospital Stay (HOSPITAL_COMMUNITY)
Admission: EM | Admit: 2016-08-05 | Discharge: 2016-08-10 | DRG: 183 | Disposition: A | Discharge status: Home / Self Care | Payer: 59 | Attending: Surgery | Admitting: Surgery

## 2016-08-05 DIAGNOSIS — E7800 Pure hypercholesterolemia, unspecified: Secondary | ICD-10-CM | POA: Diagnosis present

## 2016-08-05 DIAGNOSIS — J439 Emphysema, unspecified: Secondary | ICD-10-CM | POA: Diagnosis present

## 2016-08-05 DIAGNOSIS — S2242XA Multiple fractures of ribs, left side, initial encounter for closed fracture: Secondary | ICD-10-CM | POA: Diagnosis not present

## 2016-08-05 DIAGNOSIS — Z7951 Long term (current) use of inhaled steroids: Secondary | ICD-10-CM | POA: Diagnosis not present

## 2016-08-05 DIAGNOSIS — Z7982 Long term (current) use of aspirin: Secondary | ICD-10-CM | POA: Diagnosis not present

## 2016-08-05 DIAGNOSIS — F17200 Nicotine dependence, unspecified, uncomplicated: Secondary | ICD-10-CM | POA: Diagnosis present

## 2016-08-05 DIAGNOSIS — W01198A Fall on same level from slipping, tripping and stumbling with subsequent striking against other object, initial encounter: Secondary | ICD-10-CM | POA: Diagnosis present

## 2016-08-05 DIAGNOSIS — E039 Hypothyroidism, unspecified: Secondary | ICD-10-CM | POA: Diagnosis present

## 2016-08-05 DIAGNOSIS — I10 Essential (primary) hypertension: Secondary | ICD-10-CM | POA: Diagnosis present

## 2016-08-05 DIAGNOSIS — J9691 Respiratory failure, unspecified with hypoxia: Secondary | ICD-10-CM | POA: Diagnosis not present

## 2016-08-05 DIAGNOSIS — S2239XA Fracture of one rib, unspecified side, initial encounter for closed fracture: Secondary | ICD-10-CM | POA: Diagnosis present

## 2016-08-05 DIAGNOSIS — S2249XA Multiple fractures of ribs, unspecified side, initial encounter for closed fracture: Secondary | ICD-10-CM | POA: Diagnosis present

## 2016-08-05 LAB — COMPREHENSIVE METABOLIC PANEL
ALBUMIN: 4.1 g/dL (ref 3.5–5.0)
ALT: 33 U/L (ref 17–63)
ANION GAP: 14 (ref 5–15)
AST: 28 U/L (ref 15–41)
Alkaline Phosphatase: 76 U/L (ref 38–126)
BUN: 5 mg/dL — ABNORMAL LOW (ref 6–20)
CHLORIDE: 89 mmol/L — AB (ref 101–111)
CO2: 23 mmol/L (ref 22–32)
Calcium: 8.8 mg/dL — ABNORMAL LOW (ref 8.9–10.3)
Creatinine, Ser: 0.86 mg/dL (ref 0.61–1.24)
GFR calc non Af Amer: >60 mL/min (ref >60)
GLUCOSE: 117 mg/dL — AB (ref 65–99)
Potassium: 4.1 mmol/L (ref 3.5–5.1)
SODIUM: 126 mmol/L — AB (ref 135–145)
Total Bilirubin: 1.2 mg/dL (ref 0.3–1.2)
Total Protein: 7.1 g/dL (ref 6.5–8.1)

## 2016-08-05 LAB — CBC WITH DIFFERENTIAL/PLATELET
BASOS PCT: 0 %
Basophils Absolute: 0.1 10*3/uL (ref 0.0–0.1)
EOS ABS: 0.1 10*3/uL (ref 0.0–0.7)
EOS PCT: 0 %
HCT: 50.8 % (ref 39.0–52.0)
Hemoglobin: 18.7 g/dL — ABNORMAL HIGH (ref 13.0–17.0)
LYMPHS ABS: 1.5 10*3/uL (ref 0.7–4.0)
Lymphocytes Relative: 9 %
MCH: 35.8 pg — AB (ref 26.0–34.0)
MCHC: 36.8 g/dL — AB (ref 30.0–36.0)
MCV: 97.1 fL (ref 78.0–100.0)
Monocytes Absolute: 0.7 10*3/uL (ref 0.1–1.0)
Monocytes Relative: 4 %
NEUTROS PCT: 86 %
Neutro Abs: 13.9 10*3/uL — ABNORMAL HIGH (ref 1.7–7.7)
PLATELETS: 246 10*3/uL (ref 150–400)
RBC: 5.23 MIL/uL (ref 4.22–5.81)
RDW: 13.6 % (ref 11.5–15.5)
WBC: 16.1 10*3/uL — AB (ref 4.0–10.5)

## 2016-08-05 MED ORDER — IRBESARTAN 300 MG PO TABS
300.0000 mg | ORAL_TABLET | Freq: Every day | ORAL | Status: DC
  Administered 2016-08-06 – 2016-08-10 (×5): 300 mg via ORAL
  Filled 2016-08-05 (×5): qty 1

## 2016-08-05 MED ORDER — NIFEDIPINE ER OSMOTIC RELEASE 90 MG PO TB24
90.0000 mg | ORAL_TABLET | Freq: Every day | ORAL | Status: DC
  Administered 2016-08-06 – 2016-08-10 (×5): 90 mg via ORAL
  Filled 2016-08-05 (×5): qty 1

## 2016-08-05 MED ORDER — VALSARTAN-HYDROCHLOROTHIAZIDE 320-25 MG PO TABS: 1.0000 | ORAL_TABLET | Freq: Every day | ORAL | Status: DC

## 2016-08-05 MED ORDER — HYDROMORPHONE HCL 1 MG/ML IJ SOLN
1.0000 mg | Freq: Once | INTRAMUSCULAR | Status: AC
  Administered 2016-08-05: 1 mg via INTRAVENOUS
  Filled 2016-08-05: qty 1

## 2016-08-05 MED ORDER — DEXTROSE 5 % IV SOLN
500.0000 mg | Freq: Three times a day (TID) | INTRAVENOUS | Status: DC
  Administered 2016-08-06 – 2016-08-10 (×15): 500 mg via INTRAVENOUS
  Filled 2016-08-05 (×19): qty 5

## 2016-08-05 MED ORDER — MORPHINE SULFATE (PF) 4 MG/ML IV SOLN
6.0000 mg | Freq: Once | INTRAVENOUS | Status: AC
  Administered 2016-08-05: 6 mg via INTRAVENOUS
  Filled 2016-08-05: qty 2

## 2016-08-05 MED ORDER — HYDROMORPHONE HCL 1 MG/ML IJ SOLN
1.0000 mg | INTRAMUSCULAR | Status: DC | PRN
  Administered 2016-08-05 – 2016-08-07 (×7): 1 mg via INTRAVENOUS
  Filled 2016-08-05 (×7): qty 1

## 2016-08-05 MED ORDER — MOMETASONE FURO-FORMOTEROL FUM 200-5 MCG/ACT IN AERO
2.0000 | INHALATION_SPRAY | Freq: Two times a day (BID) | RESPIRATORY_TRACT | Status: DC
  Administered 2016-08-06 – 2016-08-10 (×8): 2 via RESPIRATORY_TRACT
  Filled 2016-08-05 (×2): qty 8.8

## 2016-08-05 MED ORDER — OXYCODONE HCL 5 MG PO TABS
10.0000 mg | ORAL_TABLET | ORAL | Status: DC | PRN
  Administered 2016-08-06 – 2016-08-09 (×10): 10 mg via ORAL
  Filled 2016-08-05 (×10): qty 2

## 2016-08-05 MED ORDER — HYDROCHLOROTHIAZIDE 25 MG PO TABS
25.0000 mg | ORAL_TABLET | Freq: Every day | ORAL | Status: DC
  Administered 2016-08-06 – 2016-08-10 (×5): 25 mg via ORAL
  Filled 2016-08-05 (×5): qty 1

## 2016-08-05 MED ORDER — OXYCODONE HCL 5 MG PO TABS: 5.0000 mg | ORAL_TABLET | ORAL | Status: DC | PRN

## 2016-08-05 MED ORDER — ONDANSETRON HCL 4 MG PO TABS: 4.0000 mg | ORAL_TABLET | Freq: Four times a day (QID) | ORAL | Status: DC | PRN

## 2016-08-05 MED ORDER — SODIUM CHLORIDE 0.9 % IV SOLN
INTRAVENOUS | Status: DC
  Administered 2016-08-06: via INTRAVENOUS

## 2016-08-05 MED ORDER — ENOXAPARIN SODIUM 40 MG/0.4ML ~~LOC~~ SOLN
40.0000 mg | Freq: Every day | SUBCUTANEOUS | Status: DC
Start: 2016-08-06 — End: 2016-08-10
  Administered 2016-08-06 – 2016-08-09 (×5): 40 mg via SUBCUTANEOUS
  Filled 2016-08-05 (×5): qty 0.4

## 2016-08-05 MED ORDER — ALBUTEROL SULFATE (2.5 MG/3ML) 0.083% IN NEBU
2.5000 mg | INHALATION_SOLUTION | Freq: Four times a day (QID) | RESPIRATORY_TRACT | Status: DC | PRN
  Administered 2016-08-06: 2.5 mg via RESPIRATORY_TRACT
  Filled 2016-08-05: qty 3

## 2016-08-05 MED ORDER — ONDANSETRON HCL 4 MG/2ML IJ SOLN
4.0000 mg | Freq: Four times a day (QID) | INTRAMUSCULAR | Status: DC | PRN
  Administered 2016-08-06: 4 mg via INTRAVENOUS
  Filled 2016-08-05: qty 2

## 2016-08-05 MED ORDER — LEVOTHYROXINE SODIUM 50 MCG PO TABS
50.0000 ug | ORAL_TABLET | Freq: Every day | ORAL | Status: DC
  Administered 2016-08-06 – 2016-08-10 (×5): 50 ug via ORAL
  Filled 2016-08-05 (×5): qty 1

## 2016-08-05 MED ORDER — ACETAMINOPHEN 325 MG PO TABS
650.0000 mg | ORAL_TABLET | ORAL | Status: DC | PRN
  Administered 2016-08-06 (×2): 650 mg via ORAL
  Filled 2016-08-05 (×2): qty 2

## 2016-08-05 NOTE — ED Notes (Signed)
Taken to xray.

## 2016-08-05 NOTE — H&P (Signed)
Frank Rosales is an 61 y.o. male.   Chief Complaint: left chest pain HPI: 69 yof who has history of right rib fx admitted before was carrying firewood and fell. He landed on a log that was sticking up on left chest. It now hurts especially with deep breathing.  No other complaints. Did not hit head, no loc. No other pain  Past Medical History:  Diagnosis Date  . Anxiety   . Arthritis    "neck; lower back; right hip; right knee" (10/18/2014)  . Asthma   . Colon polyps   . COPD (chronic obstructive pulmonary disease) ([place])   . High blood pressure   . High cholesterol   . Hilar adenopathy   . Hypothyroidism     Past Surgical History:  Procedure Laterality Date  . BACK SURGERY    . epidural injections    . FIXATION KYPHOPLASTY LUMBAR SPINE     "L1"  . KNEE ARTHROSCOPY Right 05/14/2010  . WISDOM TOOTH EXTRACTION      Family History  Problem Relation Age of Onset  . Emphysema Father   . Lung cancer Father   . Heart disease Brother   . Heart failure Mother    Social History:  reports that he has been smoking Cigarettes.  He started smoking about 43 years ago. He has a 70.00 pack-year smoking history. He has never used smokeless tobacco. He reports that he drinks about 25.2 oz of alcohol per week . He reports that he does not use drugs.  Allergies: No Known Allergies  meds reviewed  Results for orders placed or performed during the hospital encounter of 08/05/16 (from the past 48 hour(s))  CBC with Differential     Status: Abnormal   Collection Time: 08/05/16  8:24 PM  Result Value Ref Range   WBC 16.1 (H) 4.0 - 10.5 K/uL    Comment: WHITE COUNT CONFIRMED ON SMEAR   RBC 5.23 4.22 - 5.81 MIL/uL   Hemoglobin 18.7 (H) 13.0 - 17.0 g/dL   HCT 50.8 39.0 - 52.0 %   MCV 97.1 78.0 - 100.0 fL   MCH 35.8 (H) 26.0 - 34.0 pg   MCHC 36.8 (H) 30.0 - 36.0 g/dL   RDW 13.6 11.5 - 15.5 %   Platelets 246 150 - 400 K/uL    Comment: PLATELET COUNT CONFIRMED BY SMEAR   Neutrophils Relative %  86 %   Neutro Abs 13.9 (H) 1.7 - 7.7 K/uL   Lymphocytes Relative 9 %   Lymphs Abs 1.5 0.7 - 4.0 K/uL   Monocytes Relative 4 %   Monocytes Absolute 0.7 0.1 - 1.0 K/uL   Eosinophils Relative 0 %   Eosinophils Absolute 0.1 0.0 - 0.7 K/uL   Basophils Relative 0 %   Basophils Absolute 0.1 0.0 - 0.1 K/uL   Dg Ribs Unilateral W/chest Left  Result Date: 08/05/2016 CLINICAL DATA:  Track from fall while chopping wood. EXAM: LEFT RIBS AND CHEST - 3+ VIEW COMPARISON:  December 22, 2015 FINDINGS: The heart, hila, and mediastinum are normal. Healed rib fracture seen on the right. No nodules, masses, or infiltrates on the right. Mild opacity in the left base may represent contusion or atelectasis. There is air in the subcutaneous tissues of the lower left lateral chest wall. There may also be associated hematoma in this region. I suspect a small pneumothorax laterally in the left base as seen on the final image. There are at least 3 lateral left rib fractures. No other acute  abnormalities. IMPRESSION: 1. There are at least 3 mildly displaced left rib fractures. There is air and probable hematoma in the left lateral chest wall. I suspect there is a small lateral pneumothorax overlying the left base. Mild opacity in the underlying lung could represent atelectasis or contusion. Findings called to Dr. Winfred Leeds. Electronically Signed   By: Dorise Bullion III M.D   On: 08/05/2016 20:07   Ct Chest Wo Contrast  Result Date: 08/05/2016 CLINICAL DATA:  Left-sided chest pain after fall. EXAM: CT CHEST WITHOUT CONTRAST TECHNIQUE: Multidetector CT imaging of the chest was performed following the standard protocol without IV contrast. COMPARISON:  Radiographs of same day.  CT scan of April 14, 2013. FINDINGS: Cardiovascular: No significant vascular findings. Normal heart size. No pericardial effusion. Mediastinum/Nodes: No enlarged mediastinal or axillary lymph nodes. Thyroid gland, trachea, and esophagus demonstrate no  significant findings. Lungs/Pleura: Mild left pleural effusion is noted with adjacent subsegmental atelectasis. No pneumothorax is noted. Bulla formation is noted in the upper lobes bilaterally. Upper Abdomen: No acute abnormality. Musculoskeletal: Mildly displaced fractures are seen involving the lateral portions of the left fifth, sixth, seventh and eighth ribs. Mild subcutaneous emphysema is seen along the left lateral chest wall. IMPRESSION: Mild left pleural effusion is noted with adjacent subsegmental atelectasis. Stable bulla formation is noted in both upper lobes. Mildly displaced left fifth, sixth, seventh and eighth rib fractures are noted. No pneumothorax is noted. Mild subcutaneous emphysema seen along left lateral chest wall. Electronically Signed   By: Marijo Conception, M.D.   On: 08/05/2016 21:03    Review of Systems  Constitutional: Negative for chills and fever.  Respiratory: Positive for cough, shortness of breath and wheezing. Negative for sputum production.   Cardiovascular: Positive for chest pain.  All other systems reviewed and are negative.   Blood pressure (!) 124/94, pulse 100, temperature 97.9 F (36.6 C), temperature source Oral, resp. rate (!) 24, height 5\' 9"  (1.753 m), weight 90.7 kg (200 lb), SpO2 91 %. Physical Exam  Vitals reviewed. Constitutional: He is oriented to person, place, and time. He appears well-developed and well-nourished.  HENT:  Head: Normocephalic and atraumatic.  Right Ear: External ear normal.  Left Ear: External ear normal.  Mouth/Throat: Oropharynx is clear and moist.  Eyes: EOM are normal. Pupils are equal, round, and reactive to light. No scleral icterus.  Neck: Neck supple. No spinous process tenderness and no muscular tenderness present.  Cardiovascular: Regular rhythm, normal heart sounds and intact distal pulses.  Tachycardia present.   Respiratory: Effort normal. He has wheezes. He exhibits tenderness (left lateral).  GI: Soft. Bowel  sounds are normal. There is no tenderness.  Musculoskeletal: Normal range of motion. He exhibits no edema or tenderness.  Lymphadenopathy:    He has no cervical adenopathy.  Neurological: He is alert and oriented to person, place, and time.  Skin: Skin is warm and dry.     Assessment/Plan Left rib fractures  No evidence ptx despite subq air. He is certainly at risk for complications given copd, smoker Will admit to stepdown, pain control, ics May need more for pain control and can reassess in am Will check chest xray in am Restart home meds especially copd meds lovenox, scds No abd pain or tenderness so I dont think he has splenic injury  Leetta Hendriks, MD 08/05/2016, 9:54 PM

## 2016-08-05 NOTE — ED Notes (Signed)
Sats noted to be 92% on room air.  Patient c/o difficulty breathing.  Placed on O2 at 2L via [place] for comfort.  Dr. Cathleen Fears notified.

## 2016-08-05 NOTE — ED Triage Notes (Addendum)
Pt c/o trip and fall while chopping wood landing on a stick that was standing straight up. Pt c/o left sided chest/rib pain. No puncture wound noted. Pt reports increasing shortness of breath.

## 2016-08-05 NOTE — ED Provider Notes (Signed)
[facility] DEPT Provider Note   CSN: [id number] Arrival date & time: 08/05/16  1821     History   Chief Complaint Chief Complaint  Patient presents with  . Fall  . Chest Pain    HPI Frank Rosales is a 61 y.o. male PMH COPD, HTN, HLD presents for left flank pain s/p mechanical fall onto a large log. Pt reports he was carrying wood to chop and fell onto his left side onto an upright log. Immediate pain. No prior injury to area. Feels short of breath since fall.   The history is provided by the patient and the spouse.  Fall  The current episode started 6 to 12 hours ago. The problem occurs constantly. The problem has not changed since onset.Associated symptoms include chest pain (left lateral lower chest) and shortness of breath. Pertinent negatives include no abdominal pain and no headaches. The symptoms are aggravated by twisting and coughing. Nothing relieves the symptoms. He has tried nothing for the symptoms.    Past Medical History:  Diagnosis Date  . Anxiety   . Arthritis    "neck; lower back; right hip; right knee" (10/18/2014)  . Asthma   . Colon polyps   . COPD (chronic obstructive pulmonary disease) ([place])   . High blood pressure   . High cholesterol   . Hilar adenopathy   . Hypothyroidism     Patient Active Problem List   Diagnosis Date Noted  . Rib fractures 08/05/2016  . Asbestosis ([place]) 02/16/2016  . Sleep disturbance 07/06/2015  . Alcohol abuse 10/19/2014  . GAD (generalized anxiety disorder) 07/01/2014  . Hypothyroidism 12/03/2013  . Osteopenia 03/11/2013  . Cigarette smoker 07/12/2011  . Essential hypertension   . COPD GOLD III    . High cholesterol   . Hilar adenopathy     Past Surgical History:  Procedure Laterality Date  . BACK SURGERY    . epidural injections    . FIXATION KYPHOPLASTY LUMBAR SPINE     "L1"  . KNEE ARTHROSCOPY Right 05/14/2010  . WISDOM TOOTH EXTRACTION         Home Medications    Prior to Admission medications     Medication Sig Start Date End Date Taking? Authorizing Provider  albuterol (PROAIR HFA) 108 (90 Base) MCG/ACT inhaler Inhale 2 puffs into the lungs every 6 (six) hours as needed for wheezing or shortness of breath. 12/22/15  Yes Timmothy Euler, MD  aspirin 81 MG tablet Take 81 mg by mouth daily.    Yes Historical Provider, MD  budesonide-formoterol [facility]) 160-4.5 MCG/ACT inhaler 2 puffs bid 04/10/16  Yes Claretta Fraise, MD  cholecalciferol (VITAMIN D) 1000 UNITS tablet Take 1,000 Units by mouth daily.    Yes Historical Provider, MD  folic acid (FOLVITE) 1 MG tablet Take 1 tablet (1 mg total) by mouth daily. 02/06/13  Yes Orson Ape [place], FNP  KRILL OIL PO Take 1 Can by mouth daily.   Yes Historical Provider, MD  levothyroxine (SYNTHROID, LEVOTHROID) 50 MCG tablet Take 1 tablet (50 mcg total) by mouth daily before breakfast. Wait 1 hour after taking before eating or drinking anything but water. 12/05/15  Yes Claretta Fraise, MD  NIFEdipine (PROCARDIA XL/ADALAT-CC) 90 MG 24 hr tablet TAKE 1 TABLET (90 MG TOTAL) BY MOUTH DAILY. 04/17/16  Yes Claretta Fraise, MD  valsartan-hydrochlorothiazide (DIOVAN-HCT) 320-25 MG tablet TAKE 1 TABLET BY MOUTH DAILY. 06/02/16  Yes Claretta Fraise, MD  vitamin B-12 (CYANOCOBALAMIN) 250 MCG tablet Take 250 mcg by mouth  daily.   Yes Historical Provider, MD    Family History Family History  Problem Relation Age of Onset  . Emphysema Father   . Lung cancer Father   . Heart disease Brother   . Heart failure Mother     Social History Social History  Substance Use Topics  . Smoking status: Current Every Day Smoker    Packs/day: 1.75    Years: 40.00    Types: Cigarettes    Start date: 05/14/1973  . Smokeless tobacco: Never Used     Comment:     . Alcohol use 25.2 oz/week    42 Cans of beer per week     Comment: 10/18/2014 "6 beers per day"     Allergies   Patient has no known allergies.   Review of Systems Review of Systems  Respiratory: Positive for  shortness of breath. Negative for chest tightness.   Cardiovascular: Positive for chest pain (left lateral lower chest). Negative for palpitations.  Gastrointestinal: Negative for abdominal pain, nausea and vomiting.  Neurological: Negative for headaches.  All other systems reviewed and are negative.    Physical Exam Updated Vital Signs BP 115/90   Pulse (!) 110   Temp 97.9 F (36.6 C) (Oral)   Resp 12   Ht 5\' 9"  (1.753 m)   Wt 90.7 kg   SpO2 93%   BMI 29.53 kg/m   Physical Exam  Constitutional: He is oriented to person, place, and time. He appears well-developed and well-nourished. He appears distressed.  HENT:  Head: Normocephalic and atraumatic.  Eyes: Conjunctivae and EOM are normal.  Neck: Normal range of motion. Neck supple.  Cardiovascular: Normal rate and regular rhythm.   No murmur heard. Pulmonary/Chest: Effort normal. No accessory muscle usage. Tachypnea noted. No respiratory distress. He has no decreased breath sounds. He has wheezes (diffuse mild wheezing).  Can auscultate clicking of rib fragments as patient breathes  Abdominal: Soft. There is no tenderness.  Musculoskeletal: Normal range of motion. He exhibits no edema.       Back:  Neurological: He is alert and oriented to person, place, and time.  Skin: Skin is warm and dry. Capillary refill takes less than 2 seconds.  Nursing note and vitals reviewed.    ED Treatments / Results  Labs (all labs ordered are listed, but only abnormal results are displayed) Labs Reviewed  CBC WITH DIFFERENTIAL/PLATELET - Abnormal; Notable for the following:       Result Value   WBC 16.1 (*)    Hemoglobin 18.7 (*)    MCH 35.8 (*)    MCHC 36.8 (*)    Neutro Abs 13.9 (*)    All other components within normal limits  COMPREHENSIVE METABOLIC PANEL - Abnormal; Notable for the following:    Sodium 126 (*)    Chloride 89 (*)    Glucose, Bld 117 (*)    BUN 5 (*)    Calcium 8.8 (*)    All other components within normal  limits    EKG  EKG Interpretation  Date/Time:  Sunday August 05 2016 22:03:15 EDT Ventricular Rate:  112 PR Interval:    QRS Duration: 103 QT Interval:  328 QTC Calculation: 448 R Axis:   -111 Text Interpretation:  Sinus tachycardia Right superior axis Low voltage, extremity and precordial leads Nonspecific T abnormalities, anterior leads SINCE LAST TRACING HEART RATE HAS INCREASED Confirmed by Winfred Leeds  MD, SAM [phone]) on 08/05/2016 10:53:12 PM       Radiology Dg Ribs  Unilateral W/chest Left  Result Date: 08/05/2016 CLINICAL DATA:  Track from fall while chopping wood. EXAM: LEFT RIBS AND CHEST - 3+ VIEW COMPARISON:  December 22, 2015 FINDINGS: The heart, hila, and mediastinum are normal. Healed rib fracture seen on the right. No nodules, masses, or infiltrates on the right. Mild opacity in the left base may represent contusion or atelectasis. There is air in the subcutaneous tissues of the lower left lateral chest wall. There may also be associated hematoma in this region. I suspect a small pneumothorax laterally in the left base as seen on the final image. There are at least 3 lateral left rib fractures. No other acute abnormalities. IMPRESSION: 1. There are at least 3 mildly displaced left rib fractures. There is air and probable hematoma in the left lateral chest wall. I suspect there is a small lateral pneumothorax overlying the left base. Mild opacity in the underlying lung could represent atelectasis or contusion. Findings called to Dr. Winfred Leeds. Electronically Signed   By: Dorise Bullion III M.D   On: 08/05/2016 20:07   Ct Chest Wo Contrast  Result Date: 08/05/2016 CLINICAL DATA:  Left-sided chest pain after fall. EXAM: CT CHEST WITHOUT CONTRAST TECHNIQUE: Multidetector CT imaging of the chest was performed following the standard protocol without IV contrast. COMPARISON:  Radiographs of same day.  CT scan of April 14, 2013. FINDINGS: Cardiovascular: No significant vascular  findings. Normal heart size. No pericardial effusion. Mediastinum/Nodes: No enlarged mediastinal or axillary lymph nodes. Thyroid gland, trachea, and esophagus demonstrate no significant findings. Lungs/Pleura: Mild left pleural effusion is noted with adjacent subsegmental atelectasis. No pneumothorax is noted. Bulla formation is noted in the upper lobes bilaterally. Upper Abdomen: No acute abnormality. Musculoskeletal: Mildly displaced fractures are seen involving the lateral portions of the left fifth, sixth, seventh and eighth ribs. Mild subcutaneous emphysema is seen along the left lateral chest wall. IMPRESSION: Mild left pleural effusion is noted with adjacent subsegmental atelectasis. Stable bulla formation is noted in both upper lobes. Mildly displaced left fifth, sixth, seventh and eighth rib fractures are noted. No pneumothorax is noted. Mild subcutaneous emphysema seen along left lateral chest wall. Electronically Signed   By: Marijo Conception, M.D.   On: 08/05/2016 21:03    Procedures Procedures (including critical care time)  Medications Ordered in ED Medications  albuterol (PROVENTIL) (2.5 MG/3ML) 0.083% nebulizer solution 2.5 mg (not administered)  mometasone-formoterol (DULERA) 200-5 MCG/ACT inhaler 2 puff (not administered)  levothyroxine (SYNTHROID, LEVOTHROID) tablet 50 mcg (not administered)  NIFEdipine (PROCARDIA XL/ADALAT-CC) 24 hr tablet 90 mg (not administered)  valsartan-hydrochlorothiazide (DIOVAN-HCT) 320-25 MG per tablet 1 tablet (not administered)  morphine 4 MG/ML injection 6 mg (6 mg Intravenous Given 08/05/16 2022)  HYDROmorphone (DILAUDID) injection 1 mg (1 mg Intravenous Given 08/05/16 2110)     Initial Impression / Assessment and Plan / ED Course  I have reviewed the triage vital signs and the nursing notes.  Pertinent labs & imaging results that were available during my care of the patient were reviewed by me and considered in my medical decision making (see  chart for details).    61 y.o. male presents for left chest wall pain s/p mechanical fall. VSS, sat 92 % on RA, visible discomfort. No external trauma, TTP left lateral/posterior chest wall w/o crepitus. Moving air well in all fields. CXR shows three rib fx with subcutaneous air, no PTX. Pain improved with IV dilaudid. Placed 2L [place] for comfort. CT chest obtained to r/o PTX. CT chest w/o  contrast shows mildly displaced left 5-8 rib fractures w/o evidence of pneumothorax. Pt admitted to Trauma service for pain control.   Discussed with my attending physician, Dr Winfred Leeds  Final Clinical Impressions(s) / ED Diagnoses   Final diagnoses:  Closed fracture of multiple ribs of left side, initial encounter    New Prescriptions New Prescriptions   No medications on file       Monico Blitz, MD 08/05/16 [place], MD 08/06/16 [phone]

## 2016-08-05 NOTE — ED Provider Notes (Signed)
Signs of left lateral chest pain after he tripped and fell striking his left lateral chest on a log 5 PM today. Pain is worse with deep inspiration. On exam agent appears uncomfortable Glasgow Coma Score 15 HEENT exam no facial asymmetry neck supple nontender chest is tender with crepitance at left lateral wall. Heart regular rate and rhythm abdomen obese, nontender. 10 40 5 PM pain is improved and well controlled after treatment with intravenous opioids. Chest x-ray viewed by me   Orlie Dakin, MD 08/05/16 [phone]

## 2016-08-06 ENCOUNTER — Inpatient Hospital Stay (HOSPITAL_COMMUNITY): Payer: 59

## 2016-08-06 LAB — MRSA PCR SCREENING: MRSA by PCR: NEGATIVE

## 2016-08-06 MED ORDER — TRAMADOL HCL 50 MG PO TABS
50.0000 mg | ORAL_TABLET | Freq: Four times a day (QID) | ORAL | Status: DC
  Administered 2016-08-06 – 2016-08-07 (×5): 50 mg via ORAL
  Filled 2016-08-06 (×5): qty 1

## 2016-08-06 MED ORDER — DOCUSATE SODIUM 100 MG PO CAPS
100.0000 mg | ORAL_CAPSULE | Freq: Two times a day (BID) | ORAL | Status: DC
  Administered 2016-08-06 – 2016-08-10 (×9): 100 mg via ORAL
  Filled 2016-08-06 (×9): qty 1

## 2016-08-06 MED ORDER — POLYETHYLENE GLYCOL 3350 17 G PO PACK
17.0000 g | PACK | Freq: Every day | ORAL | Status: DC
  Administered 2016-08-06 – 2016-08-10 (×5): 17 g via ORAL
  Filled 2016-08-06 (×5): qty 1

## 2016-08-06 MED ORDER — IPRATROPIUM-ALBUTEROL 0.5-2.5 (3) MG/3ML IN SOLN
3.0000 mL | Freq: Four times a day (QID) | RESPIRATORY_TRACT | Status: DC | PRN
  Administered 2016-08-06 – 2016-08-08 (×6): 3 mL via RESPIRATORY_TRACT
  Filled 2016-08-06 (×6): qty 3

## 2016-08-06 NOTE — Evaluation (Signed)
Physical Therapy Evaluation Patient Details Name: Frank Rosales MRN: [id number] DOB: 27-Mar-1956 Today's Date: 08/06/2016   History of Present Illness  61 yo admitted after fall on log at home with 5-8 left rib fx. PMhx: prior rt rib fx, cOPD, HTN, hypothyroidism, anxiety, Asthma  Clinical Impression  Pt with limited transfers, gait, activity tolerance and pulmonary status due to pain. Pt educated for sequence of transfers to decrease pain as well as splinting with coughing. Pt reports transitional movements are 10/10 for left chest pain with general mobility 5/10. Pt with wife who works and prior rib fx from fall a year ago. Pt will benefit from acute therapy to maximize mobility, function, gait and activity tolerance prior to D/C.   Pt with sats 88% on RA at rest with return to 3L 94%. With gait required 6L to maintain 90% , HR 110    Follow Up Recommendations No PT follow up    Equipment Recommendations  None recommended by PT    Recommendations for Other Services OT consult     Precautions / Restrictions Precautions Precautions: Fall Precaution Comments: watch sats      Mobility  Bed Mobility Overal bed mobility: Needs Assistance Bed Mobility: Supine to Sit     Supine to sit: HOB elevated     General bed mobility comments: cues for sequence with use of rail, HOB 20 degrees and increased time with rolling right to rise  Transfers Overall transfer level: Needs assistance   Transfers: Sit to/from Stand Sit to Stand: Min guard         General transfer comment: cues for hand placement   Ambulation/Gait Ambulation/Gait assistance: Min guard Ambulation Distance (Feet): 300 Feet Assistive device: Rolling walker (2 wheeled) Gait Pattern/deviations: Step-through pattern;Decreased stride length;Trunk flexed   Gait velocity interpretation: Below normal speed for age/gender General Gait Details: cues for posture and breathing technique. pt required 6L via [place] to  maintain 90% with gait  Stairs            Wheelchair Mobility    Modified Rankin (Stroke Patients Only)       Balance Overall balance assessment: Needs assistance;History of Falls   Sitting balance-Leahy Scale: Good       Standing balance-Leahy Scale: Fair                               Pertinent Vitals/Pain Pain Assessment: 0-10 Pain Score: 5  Pain Location: left chest at rest, 10/10 with transitional movements or coughing Pain Descriptors / Indicators: Stabbing Pain Intervention(s): Limited activity within patient's tolerance;Repositioned;Monitored during session;Premedicated before session    Home Living Family/patient expects to be discharged to:: Private residence Living Arrangements: Spouse/significant other Available Help at Discharge: Family;Available PRN/intermittently Type of Home: House Home Access: Stairs to enter Entrance Stairs-Rails: None Entrance Stairs-Number of Steps: 3 Home Layout: One level Home Equipment: Walker - 2 wheels;Shower seat Additional Comments: wife works, pt has DME from mom    Prior Function Level of Independence: Needs assistance   Gait / Transfers Assistance Needed: pt typically independent with gait and transfers  ADL's / Homemaking Assistance Needed: pt states he occasionally needs assist with lower body bathing and dressing due to back pain        Hand Dominance        Extremity/Trunk Assessment   Upper Extremity Assessment Upper Extremity Assessment: Generalized weakness    Lower Extremity Assessment Lower Extremity Assessment: Overall WFL for  tasks assessed    Cervical / Trunk Assessment Cervical / Trunk Assessment: Kyphotic  Communication   Communication: No difficulties  Cognition Arousal/Alertness: Awake/alert Behavior During Therapy: WFL for tasks assessed/performed Overall Cognitive Status: Within Functional Limits for tasks assessed                                         General Comments      Exercises     Assessment/Plan    PT Assessment Patient needs continued PT services  PT Problem List Decreased mobility;Decreased activity tolerance;Cardiopulmonary status limiting activity;Decreased balance;Decreased knowledge of use of DME;Pain       PT Treatment Interventions Gait training;Therapeutic exercise;Patient/family education;DME instruction;Therapeutic activities;Stair training;Functional mobility training;Balance training    PT Goals (Current goals can be found in the Care Plan section)  Acute Rehab PT Goals Patient Stated Goal: return home PT Goal Formulation: With patient Time For Goal Achievement: 08/20/16 Potential to Achieve Goals: Good    Frequency Min 3X/week   Barriers to discharge Decreased caregiver support wife works    Co-evaluation               End of Glass blower/designer During Treatment: Oxygen Activity Tolerance: Patient tolerated treatment well Patient left: in chair;with call bell/phone within Web designer Communication: Mobility status PT Visit Diagnosis: Difficulty in walking, not elsewhere classified (R26.2);Pain Pain - Right/Left: Left Pain - part of body:  (chest)    Time: 5361-4431 PT Time Calculation (min) (ACUTE ONLY): 29 min   Charges:   PT Evaluation $PT Eval Moderate Complexity: 1 Procedure PT Treatments $Gait Training: 8-22 mins   PT G Codes:        Elwyn Reach, PT [phone]   [place] Turkessa Ostrom 08/06/2016, 12:54 PM

## 2016-08-06 NOTE — Progress Notes (Signed)
Patient ID: Frank Rosales, male   DOB: [birth year], 61 y.o.   MRN: [id number]  [facility] Surgery Progress Note     Subjective: Did not sleep very well last night. Just got back from XR, feeling nauseated. SOB stable. States that he is still in too much pain to take in deep breaths.  No flatus.  Objective: Vital signs in last 24 hours: Temp:  [97.9 F (36.6 C)-98.7 F (37.1 C)] 98.1 F (36.7 C) (03/26 0513) Pulse Rate:  [94-115] 96 (03/26 0513) Resp:  [11-24] 13 (03/26 0513) BP: (115-136)/(78-99) 131/88 (03/26 0513) SpO2:  [91 %-95 %] 95 % (03/26 0513) Weight:  [197 lb 1.5 oz (89.4 kg)-200 lb (90.7 kg)] 197 lb 1.5 oz (89.4 kg) (03/25 2312)    Intake/Output from previous day: 03/25 0701 - 03/26 0700 In: 422.5 [I.V.:312.5; IV Piggyback:110] Out: -  Intake/Output this shift: No intake/output data recorded.  PE: Gen:  Alert, NAD, pleasant Card:  RRR, no M/G/R heard Pulm:  Scattered rhonchi bilaterally with few expiratory wheezes. TTP left lateral chest Abd: Soft, NT/ND, hypoactive BS, no HSM Ext:  No erythema, edema, or tenderness   Lab Results:   Recent Labs  08/05/16 2024  WBC 16.1*  HGB 18.7*  HCT 50.8  PLT 246   BMET  Recent Labs  08/05/16 2216  NA 126*  K 4.1  CL 89*  CO2 23  GLUCOSE 117*  BUN 5*  CREATININE 0.86  CALCIUM 8.8*   PT/INR No results for input(s): LABPROT, INR in the last 72 hours. CMP     Component Value Date/Time   NA 126 (L) 08/05/2016 2216   NA 136 03/07/2016 0944   K 4.1 08/05/2016 2216   CL 89 (L) 08/05/2016 2216   CO2 23 08/05/2016 2216   GLUCOSE 117 (H) 08/05/2016 2216   BUN 5 (L) 08/05/2016 2216   BUN 7 03/07/2016 0944   CREATININE 0.86 08/05/2016 2216   CREATININE 0.96 08/06/2012 1619   CALCIUM 8.8 (L) 08/05/2016 2216   PROT 7.1 08/05/2016 2216   PROT 7.4 03/07/2016 0944   ALBUMIN 4.1 08/05/2016 2216   ALBUMIN 4.6 03/07/2016 0944   AST 28 08/05/2016 2216   ALT 33 08/05/2016 2216   ALKPHOS 76 08/05/2016  2216   BILITOT 1.2 08/05/2016 2216   BILITOT 0.8 03/07/2016 0944   GFRNONAA >60 08/05/2016 2216   GFRAA >60 08/05/2016 2216   Lipase     Component Value Date/Time   LIPASE 37 06/02/2015 0935       Studies/Results: Dg Ribs Unilateral W/chest Left  Result Date: 08/05/2016 CLINICAL DATA:  Track from fall while chopping wood. EXAM: LEFT RIBS AND CHEST - 3+ VIEW COMPARISON:  December 22, 2015 FINDINGS: The heart, hila, and mediastinum are normal. Healed rib fracture seen on the right. No nodules, masses, or infiltrates on the right. Mild opacity in the left base may represent contusion or atelectasis. There is air in the subcutaneous tissues of the lower left lateral chest wall. There may also be associated hematoma in this region. I suspect a small pneumothorax laterally in the left base as seen on the final image. There are at least 3 lateral left rib fractures. No other acute abnormalities. IMPRESSION: 1. There are at least 3 mildly displaced left rib fractures. There is air and probable hematoma in the left lateral chest wall. I suspect there is a small lateral pneumothorax overlying the left base. Mild opacity in the underlying lung could represent atelectasis or contusion. Findings  called to Dr. Winfred Leeds. Electronically Signed   By: Dorise Bullion III M.D   On: 08/05/2016 20:07   Ct Chest Wo Contrast  Result Date: 08/05/2016 CLINICAL DATA:  Left-sided chest pain after fall. EXAM: CT CHEST WITHOUT CONTRAST TECHNIQUE: Multidetector CT imaging of the chest was performed following the standard protocol without IV contrast. COMPARISON:  Radiographs of same day.  CT scan of April 14, 2013. FINDINGS: Cardiovascular: No significant vascular findings. Normal heart size. No pericardial effusion. Mediastinum/Nodes: No enlarged mediastinal or axillary lymph nodes. Thyroid gland, trachea, and esophagus demonstrate no significant findings. Lungs/Pleura: Mild left pleural effusion is noted with adjacent  subsegmental atelectasis. No pneumothorax is noted. Bulla formation is noted in the upper lobes bilaterally. Upper Abdomen: No acute abnormality. Musculoskeletal: Mildly displaced fractures are seen involving the lateral portions of the left fifth, sixth, seventh and eighth ribs. Mild subcutaneous emphysema is seen along the left lateral chest wall. IMPRESSION: Mild left pleural effusion is noted with adjacent subsegmental atelectasis. Stable bulla formation is noted in both upper lobes. Mildly displaced left fifth, sixth, seventh and eighth rib fractures are noted. No pneumothorax is noted. Mild subcutaneous emphysema seen along left lateral chest wall. Electronically Signed   By: Marijo Conception, M.D.   On: 08/05/2016 21:03    Anti-infectives: Anti-infectives    None       Assessment/Plan Ground level fall Left rib fractures 5-8 - no PNX on CXR today, it does show small left pleural effusion. IS/pulmonary toilet COPD - dulera BID and duonebs q6hr PRN; encourage IS HTN - home meds HCTZ, nifedipine, irbesartan Hypothyroidism - synthroid Tobacco abuse  Pain - schedule tramadol, robaxin PRN, oxy 5-10mg  q4hr PRN or dilaudid 1mg  q2hr PRN FEN - IVF, CLD until better bowel function VTE - SCDs, lovenox Dispo - SDU, PT, labs and repeat CXR in AM   LOS: 1 day    Jerrye Beavers , [facility] 08/06/2016, 7:31 AM Pager: [phone] Consults: [phone] Mon-Fri 7:00 am-4:30 pm Sat-Sun 7:00 am-11:30 am

## 2016-08-07 ENCOUNTER — Inpatient Hospital Stay (HOSPITAL_COMMUNITY): Payer: 59

## 2016-08-07 LAB — CBC
HCT: 45.5 % (ref 39.0–52.0)
Hemoglobin: 15.9 g/dL (ref 13.0–17.0)
MCH: 34.7 pg — ABNORMAL HIGH (ref 26.0–34.0)
MCHC: 34.9 g/dL (ref 30.0–36.0)
MCV: 99.3 fL (ref 78.0–100.0)
Platelets: 188 10*3/uL (ref 150–400)
RBC: 4.58 MIL/uL (ref 4.22–5.81)
RDW: 13.9 % (ref 11.5–15.5)
WBC: 12.8 10*3/uL — ABNORMAL HIGH (ref 4.0–10.5)

## 2016-08-07 LAB — BASIC METABOLIC PANEL
Anion gap: 12 (ref 5–15)
BUN: 16 mg/dL (ref 6–20)
CALCIUM: 8.7 mg/dL — AB (ref 8.9–10.3)
CO2: 23 mmol/L (ref 22–32)
CREATININE: 1.13 mg/dL (ref 0.61–1.24)
Chloride: 88 mmol/L — ABNORMAL LOW (ref 101–111)
GFR calc non Af Amer: >60 mL/min (ref >60)
Glucose, Bld: 127 mg/dL — ABNORMAL HIGH (ref 65–99)
Potassium: 4.2 mmol/L (ref 3.5–5.1)
SODIUM: 123 mmol/L — AB (ref 135–145)

## 2016-08-07 MED ORDER — TRAMADOL HCL 50 MG PO TABS
100.0000 mg | ORAL_TABLET | Freq: Four times a day (QID) | ORAL | Status: DC
  Administered 2016-08-07 – 2016-08-10 (×13): 100 mg via ORAL
  Filled 2016-08-07 (×13): qty 2

## 2016-08-07 MED ORDER — FAMOTIDINE 20 MG PO TABS
20.0000 mg | ORAL_TABLET | Freq: Two times a day (BID) | ORAL | Status: DC
  Administered 2016-08-07 – 2016-08-10 (×7): 20 mg via ORAL
  Filled 2016-08-07 (×7): qty 1

## 2016-08-07 MED ORDER — IBUPROFEN 400 MG PO TABS
400.0000 mg | ORAL_TABLET | ORAL | Status: DC
  Administered 2016-08-07 – 2016-08-10 (×19): 400 mg via ORAL
  Filled 2016-08-07: qty 1
  Filled 2016-08-07 (×2): qty 2
  Filled 2016-08-07 (×3): qty 1
  Filled 2016-08-07: qty 2
  Filled 2016-08-07 (×3): qty 1
  Filled 2016-08-07 (×3): qty 2
  Filled 2016-08-07 (×2): qty 1
  Filled 2016-08-07 (×2): qty 2
  Filled 2016-08-07: qty 1
  Filled 2016-08-07 (×2): qty 2

## 2016-08-07 MED ORDER — SODIUM CHLORIDE 0.9% FLUSH: 3.0000 mL | INTRAVENOUS | Status: DC | PRN

## 2016-08-07 MED ORDER — SODIUM CHLORIDE 0.9% FLUSH
3.0000 mL | Freq: Two times a day (BID) | INTRAVENOUS | Status: DC
  Administered 2016-08-07 – 2016-08-10 (×3): 3 mL via INTRAVENOUS

## 2016-08-07 MED ORDER — ACETAMINOPHEN 325 MG PO TABS
650.0000 mg | ORAL_TABLET | Freq: Four times a day (QID) | ORAL | Status: DC
  Administered 2016-08-07 – 2016-08-09 (×10): 650 mg via ORAL
  Administered 2016-08-09: 325 mg via ORAL
  Administered 2016-08-10 (×3): 650 mg via ORAL
  Filled 2016-08-07 (×14): qty 2

## 2016-08-07 MED ORDER — SODIUM CHLORIDE 0.9 % IV SOLN: 250.0000 mL | INTRAVENOUS | Status: DC | PRN

## 2016-08-07 NOTE — Progress Notes (Signed)
[facility] Progress Note     Subjective: c/o pain over left ribcage, sharp, worse than yesterday. Reports pain was better yesterday afternoon and he was pulling >2,000 cc on IS but pain became intolerable overnight. Tolerating clear liquids without nausea/vomiting. Ambulating. Urinating without hesitancy. Denies BM.   O2 sats dropping to 88% with speaking/moving.   Objective: Vital signs in last 24 hours: Temp:  [97.9 F (36.6 C)-98.7 F (37.1 C)] 98.3 F (36.8 C) (03/27 0512) Pulse Rate:  [83-110] 91 (03/27 0512) Resp:  [10-16] 15 (03/27 0512) BP: (97-126)/(73-83) 126/83 (03/27 0512) SpO2:  [91 %-94 %] 92 % (03/27 0512) Last BM Date: 08/05/16  Intake/Output from previous day: 03/26 0701 - 03/27 0700 In: 2517.5 [P.O.:1320; I.V.:1087.5; IV Piggyback:110] Out: 500 [Urine:500] Intake/Output this shift: No intake/output data recorded.  PE: Gen:  Alert, NAD, pleasant and cooperative Card:  Sinus tachycardia  Pulm:  Splinted breathing 2/2 pain, lungs are clear to auscultation bilaterally w/ diminished breath sounds in bilaterally lung bases. Abd: Soft, non-tender, non-distended Ext:  No erythema, edema, or tenderness   Lab Results:   Recent Labs  08/05/16 2024 08/07/16 0311  WBC 16.1* 12.8*  HGB 18.7* 15.9  HCT 50.8 45.5  PLT 246 188   BMET  Recent Labs  08/05/16 2216 08/07/16 0311  NA 126* 123*  K 4.1 4.2  CL 89* 88*  CO2 23 23  GLUCOSE 117* 127*  BUN 5* 16  CREATININE 0.86 1.13  CALCIUM 8.8* 8.7*   PT/INR No results for input(s): LABPROT, INR in the last 72 hours. CMP     Component Value Date/Time   NA 123 (L) 08/07/2016 0311   NA 136 03/07/2016 0944   K 4.2 08/07/2016 0311   CL 88 (L) 08/07/2016 0311   CO2 23 08/07/2016 0311   GLUCOSE 127 (H) 08/07/2016 0311   BUN 16 08/07/2016 0311   BUN 7 03/07/2016 0944   CREATININE 1.13 08/07/2016 0311   CREATININE 0.96 08/06/2012 1619   CALCIUM 8.7 (L) 08/07/2016 0311   PROT 7.1 08/05/2016  2216   PROT 7.4 03/07/2016 0944   ALBUMIN 4.1 08/05/2016 2216   ALBUMIN 4.6 03/07/2016 0944   AST 28 08/05/2016 2216   ALT 33 08/05/2016 2216   ALKPHOS 76 08/05/2016 2216   BILITOT 1.2 08/05/2016 2216   BILITOT 0.8 03/07/2016 0944   GFRNONAA >60 08/07/2016 0311   GFRAA >60 08/07/2016 0311   Lipase     Component Value Date/Time   LIPASE 37 06/02/2015 0935       Studies/Results: Dg Chest 2 View  Result Date: 08/07/2016 CLINICAL DATA:  61 year old male with history of multiple rib fractures. Shortness of breath and congestion. EXAM: CHEST  2 VIEW COMPARISON:  Chest x-ray 08/07/2015. FINDINGS: Again noted are multiple fractures of the left fifth through eighth ribs, with overlying subcutaneous emphysema in the left chest wall. The extent of left chest wall emphysema appears slightly decreased compared to yesterday's examination. Some subcutaneous emphysema is now seen tracking into the lower left cervical region. Lung volumes are low. Left basilar opacity likely reflects subsegmental atelectasis. Left pleural thickening, with small left pleural effusion. No appreciable left-sided pneumothorax noted at this time. Right lung is well aerated. No evidence of pulmonary edema. Heart size is normal. Upper mediastinal contours are within normal limits. IMPRESSION: 1. Multiple left-sided rib fractures involving the left fifth through eighth ribs again noted. Decreasing subcutaneous emphysema in the left chest wall. 2. Small left-sided pleural fluid collection, presumably a  small hemothorax, with some associated subsegmental atelectasis in the left lower lobe. Electronically Signed   By: Vinnie Langton M.D.   On: 08/07/2016 07:13   Dg Chest 2 View  Result Date: 08/06/2016 CLINICAL DATA:  61 year old male with history of trauma from a fall. Left-sided rib pain. EXAM: CHEST  2 VIEW COMPARISON:  Chest x-ray 08/05/2016. FINDINGS: Acute nondisplaced and minimally displaced fractures of the left fifth  through eighth ribs are again noted. There is increasing overlying subcutaneous emphysema in the lateral aspect of the left chest wall. No appreciable pneumothorax. Small left pleural fluid collection (presumably a hemothorax). Some linear opacities in the base of the left lung, presumably areas of subsegmental atelectasis. No definite acute consolidative airspace disease. No right pleural effusion. No evidence of pulmonary edema. Heart size is normal. Mediastinal contours are within normal limits. Multiple old healed right-sided rib fractures are also incidentally noted. IMPRESSION: 1. Multiple fractures of the left fifth through eighth ribs 3 demonstrated, with increasing overlying subcutaneous emphysema in the left chest wall. There is no appreciable pneumothorax at this time. Small left pleural fluid collection (presumably a hemothorax) is noted, as well as some passive subsegmental atelectasis throughout the left lung base. Electronically Signed   By: Vinnie Langton M.D.   On: 08/06/2016 07:47   Dg Ribs Unilateral W/chest Left  Result Date: 08/05/2016 CLINICAL DATA:  Track from fall while chopping wood. EXAM: LEFT RIBS AND CHEST - 3+ VIEW COMPARISON:  December 22, 2015 FINDINGS: The heart, hila, and mediastinum are normal. Healed rib fracture seen on the right. No nodules, masses, or infiltrates on the right. Mild opacity in the left base may represent contusion or atelectasis. There is air in the subcutaneous tissues of the lower left lateral chest wall. There may also be associated hematoma in this region. I suspect a small pneumothorax laterally in the left base as seen on the final image. There are at least 3 lateral left rib fractures. No other acute abnormalities. IMPRESSION: 1. There are at least 3 mildly displaced left rib fractures. There is air and probable hematoma in the left lateral chest wall. I suspect there is a small lateral pneumothorax overlying the left base. Mild opacity in the  underlying lung could represent atelectasis or contusion. Findings called to Dr. Winfred Leeds. Electronically Signed   By: Dorise Bullion III M.D   On: 08/05/2016 20:07   Ct Chest Wo Contrast  Result Date: 08/05/2016 CLINICAL DATA:  Left-sided chest pain after fall. EXAM: CT CHEST WITHOUT CONTRAST TECHNIQUE: Multidetector CT imaging of the chest was performed following the standard protocol without IV contrast. COMPARISON:  Radiographs of same day.  CT scan of April 14, 2013. FINDINGS: Cardiovascular: No significant vascular findings. Normal heart size. No pericardial effusion. Mediastinum/Nodes: No enlarged mediastinal or axillary lymph nodes. Thyroid gland, trachea, and esophagus demonstrate no significant findings. Lungs/Pleura: Mild left pleural effusion is noted with adjacent subsegmental atelectasis. No pneumothorax is noted. Bulla formation is noted in the upper lobes bilaterally. Upper Abdomen: No acute abnormality. Musculoskeletal: Mildly displaced fractures are seen involving the lateral portions of the left fifth, sixth, seventh and eighth ribs. Mild subcutaneous emphysema is seen along the left lateral chest wall. IMPRESSION: Mild left pleural effusion is noted with adjacent subsegmental atelectasis. Stable bulla formation is noted in both upper lobes. Mildly displaced left fifth, sixth, seventh and eighth rib fractures are noted. No pneumothorax is noted. Mild subcutaneous emphysema seen along left lateral chest wall. Electronically Signed   By: Jeneen Rinks  Murlean Caller, M.D.   On: 08/05/2016 21:03    Anti-infectives: Anti-infectives    None     Assessment/Plan Ground level fall Left rib fractures 5-8 - no PTX on CXR today, stable small left pleural effusion. IS/pulmonary toilet COPD - dulera BID and duonebs q6hr PRN; encourage IS HTN - home meds HCTZ, nifedipine, irbesartan Hypothyroidism - synthroid Tobacco abuse  Pain - Tylenol 650 mg q 6h, ibuprofen 400 mg q 4h, 100 mg ULTRAM q 6h,  robaxin PRN, oxy 5-10mg  q4hr PRN or dilaudid 1mg  q2hr PRN FEN - IVF, CLD until better bowel function VTE - SCDs, lovenox Dispo - SDU. Pain control, add flutter valve, wheezing improved with duonebs - continue BID.    LOS: 2 days    Jill Alexanders , [facility] 08/07/2016, 8:06 AM Pager: [phone] Consults: [phone] Mon-Fri 7:00 am-4:30 pm Sat-Sun 7:00 am-11:30 am

## 2016-08-07 NOTE — Care Management Note (Signed)
Case Management Note  Patient Details  Name: Frank Rosales MRN: [id number] Date of Birth: [birth year]  Subjective/Objective:   Pt admitted on 08/05/16 s/p fall carrying firewood.  PTA, pt resided at home with wife and daughter.                   Action/Plan: Pt progress limited by pain; pt states wife and daughter can provide intermittent supervision at dc.  Will follow progress.  Expected Discharge Date:  08/09/16               Expected Discharge Plan:  Home/Self Care  In-House Referral:     Discharge planning Services  CM Consult  Post Acute Care Choice:    Choice offered to:     DME Arranged:    DME Agency:     HH Arranged:    HH Agency:     Status of Service:  In process, will continue to follow  If discussed at Long Length of Stay Meetings, dates discussed:    Additional Comments:  Reinaldo Raddle, RN, BSN  Trauma/Neuro ICU Case Manager [phone]

## 2016-08-08 ENCOUNTER — Inpatient Hospital Stay (HOSPITAL_COMMUNITY): Payer: 59

## 2016-08-08 LAB — BASIC METABOLIC PANEL
Anion gap: 10 (ref 5–15)
BUN: 15 mg/dL (ref 6–20)
CHLORIDE: 90 mmol/L — AB (ref 101–111)
CO2: 25 mmol/L (ref 22–32)
Calcium: 8.5 mg/dL — ABNORMAL LOW (ref 8.9–10.3)
Creatinine, Ser: 0.91 mg/dL (ref 0.61–1.24)
GFR calc Af Amer: >60 mL/min (ref >60)
GLUCOSE: 102 mg/dL — AB (ref 65–99)
POTASSIUM: 3.9 mmol/L (ref 3.5–5.1)
Sodium: 125 mmol/L — ABNORMAL LOW (ref 135–145)

## 2016-08-08 NOTE — Progress Notes (Signed)
qPhysical Therapy Treatment Patient Details Name: Frank Rosales MRN: [id number] DOB: 1955-05-23 Today's Date: 08/08/2016    History of Present Illness Pt is a 61 y.o. male admitted after fall on log at home with 5-8 left rib fx. PMhx: prior rt rib fx, cOPD, HTN, hypothyroidism, anxiety, Asthma    PT Comments    Pt progressing with mobility, with improved ability to perform bed mob with supervision and amb in hallway with RW and min guard. Pt remains limited by L chest pain and dyspnea 2/4 with exertion; SpO2 >86% on 6L O2 [place]. Educ on importance of using IS as tolerated. Pt motivated to work with PT. Will continue to follow acutely.    Follow Up Recommendations  No PT follow up     Equipment Recommendations  None recommended by PT    Recommendations for Other Services OT consult     Precautions / Restrictions Precautions Precautions: Fall Precaution Comments: Watch O2 Restrictions Weight Bearing Restrictions: No    Mobility  Bed Mobility Overal bed mobility: Needs Assistance Bed Mobility: Supine to Sit     Supine to sit: HOB elevated;Supervision     General bed mobility comments: Pt requesting to roll onto R side secondary to pain; did not require cues for sequence  Transfers Overall transfer level: Needs assistance Equipment used: Rolling walker (2 wheeled) Transfers: Sit to/from Stand Sit to Stand: Min guard            Ambulation/Gait Ambulation/Gait assistance: Min guard Ambulation Distance (Feet): 250 Feet Assistive device: Rolling walker (2 wheeled) Gait Pattern/deviations: Step-through pattern;Decreased stride length;Trunk flexed   Gait velocity interpretation: Below normal speed for age/gender General Gait Details: Required 2x standing rest breaks secondary to SpO2 down to 86% with amb; cues for breathing technique    Stairs            Wheelchair Mobility    Modified Rankin (Stroke Patients Only)       Balance Overall balance  assessment: Needs assistance;History of Falls   Sitting balance-Leahy Scale: Good       Standing balance-Leahy Scale: Fair Standing balance comment: Able to stand with no AD and min guard. RW for balance with amb.                            Cognition Arousal/Alertness: Awake/alert Behavior During Therapy: WFL for tasks assessed/performed Overall Cognitive Status: Within Functional Limits for tasks assessed                                        Exercises      General Comments General comments (skin integrity, edema, etc.): SpO2 >86% on 6L O2 [place]; HR up to 120s with amb.      Pertinent Vitals/Pain Pain Assessment: 0-10 Pain Score: 6  Pain Location: L chest  Pain Descriptors / Indicators: Stabbing;Constant Pain Intervention(s): Limited activity within patient's tolerance;Monitored during session    Home Living                      Prior Function            PT Goals (current goals can now be found in the care plan section) Progress towards PT goals: Progressing toward goals    Frequency    Min 3X/week      PT Plan Current plan remains appropriate  Co-evaluation             End of Session Equipment Utilized During Treatment: Oxygen;Gait belt Activity Tolerance: Patient tolerated treatment well Patient left: in chair;with call bell/phone within reach Nurse Communication: Mobility status PT Visit Diagnosis: Difficulty in walking, not elsewhere classified (R26.2);Pain Pain - Right/Left: Left Pain - part of body:  (chest)     Time: 2761-4709 PT Time Calculation (min) (ACUTE ONLY): 18 min  Charges:  $Gait Training: 8-22 mins                    G Codes:      Enis Gash, SPT Office-[phone]  Mabeline Caras 08/08/2016, 11:00 AM

## 2016-08-08 NOTE — Progress Notes (Signed)
Attempted to call report to 6N at this time

## 2016-08-08 NOTE — Progress Notes (Signed)
Transferred pt to 6N19 at this time.  Report given to [facility] RN.  Pt has no s/s of any acute distress noted at this time.  Wife and sister aware of transfer.

## 2016-08-08 NOTE — Progress Notes (Signed)
[facility] Progress Note     Subjective: Cc left-sided chest pain with coughing. Pain improved compared to yesterday with addition of ibuprofen and increased ULTRAM. Pulling 1750 on IS and ambulating with therapies. Reports O2 sats at baseline are 92%. Tolerating PO. Urinating and having flatus.  Objective: Vital signs in last 24 hours: Temp:  [97.7 F (36.5 C)-98.8 F (37.1 C)] 98 F (36.7 C) (03/28 1259) Pulse Rate:  [86-98] 98 (03/28 1259) Resp:  [10-24] 20 (03/28 1259) BP: (112-133)/(70-91) 112/83 (03/28 1259) SpO2:  [89 %-92 %] 91 % (03/28 1259) FiO2 (%):  [40 %] 40 % (03/28 0739) Last BM Date: 08/05/16  Intake/Output from previous day: 03/27 0701 - 03/28 0700 In: 1245 [P.O.:1080; IV Piggyback:165] Out: 950 [Urine:950] Intake/Output this shift: Total I/O In: 660 [P.O.:660] Out: 800 [Urine:800]  PE: Gen:  Alert, NAD, pleasant and cooperative Card:  regular rate and rhythm Pulm:  non-labored, some expiratory rhonchi bilaterally, O2 Sats 92% Abd: Soft, non-tender, non-distended Ext:  No erythema, edema, or tenderness   Lab Results:   Recent Labs  08/05/16 2024 08/07/16 0311  WBC 16.1* 12.8*  HGB 18.7* 15.9  HCT 50.8 45.5  PLT 246 188   BMET  Recent Labs  08/07/16 0311 08/08/16 0341  NA 123* 125*  K 4.2 3.9  CL 88* 90*  CO2 23 25  GLUCOSE 127* 102*  BUN 16 15  CREATININE 1.13 0.91  CALCIUM 8.7* 8.5*   PT/INR No results for input(s): LABPROT, INR in the last 72 hours. CMP     Component Value Date/Time   NA 125 (L) 08/08/2016 0341   NA 136 03/07/2016 0944   K 3.9 08/08/2016 0341   CL 90 (L) 08/08/2016 0341   CO2 25 08/08/2016 0341   GLUCOSE 102 (H) 08/08/2016 0341   BUN 15 08/08/2016 0341   BUN 7 03/07/2016 0944   CREATININE 0.91 08/08/2016 0341   CREATININE 0.96 08/06/2012 1619   CALCIUM 8.5 (L) 08/08/2016 0341   PROT 7.1 08/05/2016 2216   PROT 7.4 03/07/2016 0944   ALBUMIN 4.1 08/05/2016 2216   ALBUMIN 4.6 03/07/2016 0944    AST 28 08/05/2016 2216   ALT 33 08/05/2016 2216   ALKPHOS 76 08/05/2016 2216   BILITOT 1.2 08/05/2016 2216   BILITOT 0.8 03/07/2016 0944   GFRNONAA >60 08/08/2016 0341   GFRAA >60 08/08/2016 0341   Lipase     Component Value Date/Time   LIPASE 37 06/02/2015 0935       Studies/Results: Dg Chest 2 View  Result Date: 08/07/2016 CLINICAL DATA:  61 year old male with history of multiple rib fractures. Shortness of breath and congestion. EXAM: CHEST  2 VIEW COMPARISON:  Chest x-ray 08/07/2015. FINDINGS: Again noted are multiple fractures of the left fifth through eighth ribs, with overlying subcutaneous emphysema in the left chest wall. The extent of left chest wall emphysema appears slightly decreased compared to yesterday's examination. Some subcutaneous emphysema is now seen tracking into the lower left cervical region. Lung volumes are low. Left basilar opacity likely reflects subsegmental atelectasis. Left pleural thickening, with small left pleural effusion. No appreciable left-sided pneumothorax noted at this time. Right lung is well aerated. No evidence of pulmonary edema. Heart size is normal. Upper mediastinal contours are within normal limits. IMPRESSION: 1. Multiple left-sided rib fractures involving the left fifth through eighth ribs again noted. Decreasing subcutaneous emphysema in the left chest wall. 2. Small left-sided pleural fluid collection, presumably a small hemothorax, with some associated subsegmental atelectasis  in the left lower lobe. Electronically Signed   By: Vinnie Langton M.D.   On: 08/07/2016 07:13   Dg Chest Port 1 View  Result Date: 08/08/2016 CLINICAL DATA:  Fall. EXAM: PORTABLE CHEST 1 VIEW COMPARISON:  08/07/2016.  08/06/2016.  CT 08/05/2016 . FINDINGS: Mediastinum and hilar structures are normal. Heart size stable. New right base infiltrate noted consistent pneumonia. Stable left base atelectasis and small left pleural effusion. No definite pneumothorax  noted. Multiple left rib fractures and left chest wall subcutaneous emphysema again noted. IMPRESSION: 1.  New onset right base infiltrate consistent pneumonia. 2. Persistent left base atelectasis and small left pleural effusion. No definite pneumothorax. Multiple left rib fractures and left chest wall subcutaneous emphysema again noted. Electronically Signed   By: Marcello Moores  Register   On: 08/08/2016 07:53    Anti-infectives: Anti-infectives    None     Assessment/Plan Ground level fall Left rib fractures 5-8- no PTX on CXR yesterday, stable small left pleural effusion. IS/pulmonary toilet - O2 Sats dropping w/ ambulation; I have contacted patient's pulmonologist (Dr. Melvyn Novas) regarding pts possible need for outpatient oxygen therapy at discharge.    COPD - dulera BID and duonebs q6hr PRN; encourage IS HTN - home meds HCTZ, nifedipine, irbesartan Hypothyroidism - synthroid Tobacco abuse  Pain - Tylenol 650 mg q 6h, ibuprofen 400 mg q 4h, 100 mg ULTRAM q 6h, robaxin PRN, oxy 5-10mg  q4hr PRN or dilaudid 1mg  q2hr PRN FEN - IVF, CLD until better bowel function VTE - SCDs, lovenox  Dispo - transfer to floor. Continue duonebs    LOS: 3 days    Jill Alexanders , [facility] 08/08/2016, 4:01 PM Pager: [phone] Consults: [phone] Mon-Fri 7:00 am-4:30 pm Sat-Sun 7:00 am-11:30 am

## 2016-08-09 ENCOUNTER — Inpatient Hospital Stay (HOSPITAL_COMMUNITY): Payer: 59

## 2016-08-09 LAB — CBC
HEMATOCRIT: 40.8 % (ref 39.0–52.0)
HEMOGLOBIN: 14.6 g/dL (ref 13.0–17.0)
MCH: 34.7 pg — ABNORMAL HIGH (ref 26.0–34.0)
MCHC: 35.8 g/dL (ref 30.0–36.0)
MCV: 96.9 fL (ref 78.0–100.0)
Platelets: 175 10*3/uL (ref 150–400)
RBC: 4.21 MIL/uL — AB (ref 4.22–5.81)
RDW: 13.5 % (ref 11.5–15.5)
WBC: 9 10*3/uL (ref 4.0–10.5)

## 2016-08-09 MED ORDER — ACETAMINOPHEN 325 MG PO TABS: 650.0000 mg | ORAL_TABLET | Freq: Four times a day (QID) | ORAL | Status: DC | PRN

## 2016-08-09 MED ORDER — IBUPROFEN 400 MG PO TABS: 400.0000 mg | ORAL_TABLET | ORAL | 0 refills | Status: DC | PRN

## 2016-08-09 MED ORDER — GUAIFENESIN ER 600 MG PO TB12
600.0000 mg | ORAL_TABLET | Freq: Two times a day (BID) | ORAL | Status: DC
  Administered 2016-08-09 – 2016-08-10 (×3): 600 mg via ORAL
  Filled 2016-08-09 (×3): qty 1

## 2016-08-09 NOTE — Progress Notes (Addendum)
qPhysical Therapy Treatment Patient Details Name: Frank Rosales MRN: [id number] DOB: [birth year] Today's Date: 08/09/2016    History of Present Illness Pt is a 61 y.o. male admitted after fall on log at home with 5-8 left rib fx. PMhx: prior rt rib fx, cOPD, HTN, hypothyroidism, anxiety, Asthma    PT Comments    Patient is making good progress with PT.  From a mobility standpoint anticipate patient will be ready for DC home. Pt supervision for transfer to RW and min guard for ambulation of 600 feet with RW. Pt O2 stayed above 95% throughout session will work to wean O2 with ambulation in next therapy session. Pt requires skilled PT to progress gait training and start stair training while weaning O2 to improve endurance and exercise tolerance to activity without O2 support to safely mobilize in his home environment.       Follow Up Recommendations  No PT follow up     Equipment Recommendations  None recommended by PT    Recommendations for Other Services       Precautions / Restrictions Precautions Precautions: Fall Precaution Comments: Watch O2 Restrictions Weight Bearing Restrictions: No    Mobility  Bed Mobility               General bed mobility comments: in recliner at entry  Transfers Overall transfer level: Needs assistance Equipment used: Rolling walker (2 wheeled) Transfers: Sit to/from Stand Sit to Stand: Supervision         General transfer comment: steady   Ambulation/Gait Ambulation/Gait assistance: Min guard Ambulation Distance (Feet): 620 Feet Assistive device: Rolling walker (2 wheeled) Gait Pattern/deviations: WFL(Within Functional Limits) Gait velocity: moderate Gait velocity interpretation: >2.62 ft/sec, indicative of independent community ambulator General Gait Details: able to walk entire distance only stopping to look out the window       Balance Overall balance assessment: History of Falls   Sitting balance-Leahy Scale: Good        Standing balance-Leahy Scale: Good Standing balance comment: able to stand without assistance and adjust his clothing                             Cognition Arousal/Alertness: Awake/alert Behavior During Therapy: WFL for tasks assessed/performed Overall Cognitive Status: Within Functional Limits for tasks assessed                                           General Comments General comments (skin integrity, edema, etc.): At rest SaO2 on 5L via nasal 97% O2, during ambulation on 6L O2 via SaO2 95% O2      Pertinent Vitals/Pain Pain Assessment: Faces Faces Pain Scale: Hurts a little bit Pain Location: L chest  Pain Descriptors / Indicators: Stabbing;Constant  VSS           PT Goals (current goals can now be found in the care plan section) Acute Rehab PT Goals PT Goal Formulation: With patient Time For Goal Achievement: 08/20/16 Potential to Achieve Goals: Good Progress towards PT goals: Progressing toward goals    Frequency    Min 3X/week      PT Plan Current plan remains appropriate       End of Session Equipment Utilized During Treatment: Oxygen;Gait belt Activity Tolerance: Patient tolerated treatment well Patient left: in chair;with call bell/phone within reach;with family/visitor present Nurse Communication:  Mobility status PT Visit Diagnosis: Difficulty in walking, not elsewhere classified (R26.2);Pain Pain - Right/Left: Left Pain - part of body:  (ribcage)     Time: 9767-3419 PT Time Calculation (min) (ACUTE ONLY): 22 min  Charges:  $Gait Training: 8-22 mins                    G Codes:       Surie Suchocki B. Migdalia Dk PT, DPT Acute Rehabilitation  [phone] Pager [phone]     [place] 08/09/2016, 4:29 PM

## 2016-08-09 NOTE — Discharge Instructions (Signed)
Rib Fracture A rib fracture is a break or crack in one of the bones of the ribs. The ribs are like a cage that goes around your upper chest. A broken or cracked rib is often painful, but most do not cause other problems. Most rib fractures heal on their own in 1-3 months. Follow these instructions at home:  Avoid activities that cause pain to the injured area. Protect your injured area.  Slowly increase activity as told by your doctor.  Take medicine as told by your doctor.  Put ice on the injured area for the first 1-2 days after you have been treated or as told by your doctor.  Put ice in a plastic bag.  Place a towel between your skin and the bag.  Leave the ice on for 15-20 minutes at a time, every 2 hours while you are awake.  Do deep breathing as told by your doctor. You may be told to:  Take deep breaths many times a day.  Cough many times a day while hugging a pillow.  Use a device (incentive spirometer) to perform deep breathing many times a day.  Drink enough fluids to keep your pee (urine) clear or pale yellow.  Do not wear a rib belt or binder. These do not allow you to breathe deeply. Get help right away if:  You have a fever.  You have trouble breathing.  You cannot stop coughing.  You cough up thick or bloody spit (mucus).  You feel sick to your stomach (nauseous), throw up (vomit), or have belly (abdominal) pain.  Your pain gets worse and medicine does not help. This information is not intended to replace advice given to you by your health care provider. Make sure you discuss any questions you have with your health care provider. Document Released: 02/07/2008 Document Revised: 10/06/2015 Document Reviewed: 07/02/2012 Elsevier Interactive Patient Education  2017 [employer].

## 2016-08-09 NOTE — Progress Notes (Signed)
[facility] Progress Note     Subjective: CC trouble keeping oxygen saturations up this AM. Also complains of nasal and chest congestion. Denies fever, chills, or productive cough. Tolerating PO. Mobilizing as tolerated. Pulling 2000 on IS.  Objective: Vital signs in last 24 hours: Temp:  [97.5 F (36.4 C)-98.1 F (36.7 C)] 98 F (36.7 C) (03/29 0426) Pulse Rate:  [86-111] 99 (03/29 0426) Resp:  [10-21] 17 (03/29 0426) BP: (107-139)/(70-83) 107/80 (03/29 0426) SpO2:  [89 %-95 %] 92 % (03/29 0426) FiO2 (%):  [40 %-44 %] 44 % (03/28 1624) Last BM Date: 08/05/16  Intake/Output from previous day: 03/28 0701 - 03/29 0700 In: 1415 [P.O.:1360; IV Piggyback:55] Out: 2450 [Urine:2450] Intake/Output this shift: No intake/output data recorded.  PE: Gen: Alert, NAD, pleasant and cooperative Card: regular rate and rhythm Pulm: non-labored, diminished breath sounds in bilateral lung bases, O2 Sats 87% on 5L [place] Abd: Soft, non-tender, non-distended Ext: No erythema, edema, or tenderness   Lab Results:   Recent Labs  08/07/16 0311 08/09/16 0515  WBC 12.8* 9.0  HGB 15.9 14.6  HCT 45.5 40.8  PLT 188 175   BMET  Recent Labs  08/07/16 0311 08/08/16 0341  NA 123* 125*  K 4.2 3.9  CL 88* 90*  CO2 23 25  GLUCOSE 127* 102*  BUN 16 15  CREATININE 1.13 0.91  CALCIUM 8.7* 8.5*   PT/INR No results for input(s): LABPROT, INR in the last 72 hours. CMP     Component Value Date/Time   NA 125 (L) 08/08/2016 0341   NA 136 03/07/2016 0944   K 3.9 08/08/2016 0341   CL 90 (L) 08/08/2016 0341   CO2 25 08/08/2016 0341   GLUCOSE 102 (H) 08/08/2016 0341   BUN 15 08/08/2016 0341   BUN 7 03/07/2016 0944   CREATININE 0.91 08/08/2016 0341   CREATININE 0.96 08/06/2012 1619   CALCIUM 8.5 (L) 08/08/2016 0341   PROT 7.1 08/05/2016 2216   PROT 7.4 03/07/2016 0944   ALBUMIN 4.1 08/05/2016 2216   ALBUMIN 4.6 03/07/2016 0944   AST 28 08/05/2016 2216   ALT 33 08/05/2016  2216   ALKPHOS 76 08/05/2016 2216   BILITOT 1.2 08/05/2016 2216   BILITOT 0.8 03/07/2016 0944   GFRNONAA >60 08/08/2016 0341   GFRAA >60 08/08/2016 0341   Lipase     Component Value Date/Time   LIPASE 37 06/02/2015 0935       Studies/Results: Dg Chest Port 1 View  Result Date: 08/08/2016 CLINICAL DATA:  Fall. EXAM: PORTABLE CHEST 1 VIEW COMPARISON:  08/07/2016.  08/06/2016.  CT 08/05/2016 . FINDINGS: Mediastinum and hilar structures are normal. Heart size stable. New right base infiltrate noted consistent pneumonia. Stable left base atelectasis and small left pleural effusion. No definite pneumothorax noted. Multiple left rib fractures and left chest wall subcutaneous emphysema again noted. IMPRESSION: 1.  New onset right base infiltrate consistent pneumonia. 2. Persistent left base atelectasis and small left pleural effusion. No definite pneumothorax. Multiple left rib fractures and left chest wall subcutaneous emphysema again noted. Electronically Signed   By: Marcello Moores  Register   On: 08/08/2016 07:53    Anti-infectives: Anti-infectives    None     Assessment/Plan Ground level fall Left rib fractures 5-8- CXR no PTX but new consolidation at R lung base, stable small left pleural effusion. IS/pulmonary toilet Hypoxia - O2 Sats dropping w/ ambulation; patient's pulmonologist (Dr. Melvyn Novas) willing to manage patents oxygen therapy, should he require home O2 at discharge.  COPD - dulera BID and duonebs q6hr PRN; encourage IS HTN - home meds HCTZ, nifedipine, irbesartan Hypothyroidism - synthroid Tobacco abuse  Pain - Tylenol 650 mgq 6h, ibuprofen 400 mg q 4h, 100 mg ULTRAM q 6h, robaxin PRN, oxy 5-10mg  q4hr PRN or dilaudid 1mg  q2hr PRN FEN - IVF, CLD until better bowel function VTE - SCDs, lovenox  Dispo - resp failure 2/2 above injuries/COPD. Add guaifenesin. CXR today at 1200. Mobilize with therapies and wean O2 as tolerated.   LOS: 4 days    [facility] 08/09/2016, 7:36 AM Pager: [phone] Consults: [phone] Mon-Fri 7:00 am-4:30 pm Sat-Sun 7:00 am-11:30 am

## 2016-08-09 NOTE — Discharge Summary (Signed)
[facility] Discharge Summary   Patient ID: Frank Rosales MRN: [id number] DOB/AGE: 10-18-1955 61 y.o.  Admit date: 08/05/2016 Discharge date: 08/10/2016   Discharge Diagnosis Patient Active Problem List   Diagnosis Date Noted  . Rib fractures, left 08/05/2016  . Fall, ground level 02/16/2016  . COPD 07/06/2015  . hypertension 10/19/2014  . hypothyroidism  07/01/2014  . Hypothyroidism 12/03/2013  . Cigarette smoker 07/12/2011   Consultants None   Imaging: 08/05/16 CT CHEST Wo: Mild left pleural effusion is noted with adjacent subsegmental atelectasis. Stable bulla formation is noted in both upper lobes. Mildly displaced left fifth, sixth, seventh and eighth rib fractures are noted. No pneumothorax is noted. Mild subcutaneous emphysema seen along left lateral chest wall.  08/06/16 DG CHEST: Multiple fractures of the left fifth through eighth ribs 3 demonstrated, with increasing overlying subcutaneous emphysema in the left chest wall. There is no appreciable pneumothorax at this time. Small left pleural fluid collection (presumably a hemothorax) is noted, as well as some passive subsegmental atelectasis throughout the left lung base.  08/07/16 DG CHEST: 1. Multiple left-sided rib fractures involving the left fifth through eighth ribs again noted. Decreasing subcutaneous emphysema in the left chest wall. Small left-sided pleural fluid collection, presumably a small hemothorax, with some associated subsegmental atelectasis in the left lower lobe.  08/08/16 DG CHEST:  New onset right base infiltrate consistent pneumonia. Persistent left base atelectasis and small left pleural effusion. No definite pneumothorax. Multiple left rib fractures and left chest wall subcutaneous emphysema again noted.  08/09/16 DG CHEST: Persistent small left pleural effusion with left basilar atelectasis or lung contusion. Multiple left rib fractures and subcutaneous emphysema left chest wall again noted.  Old right rib fractures are stable. There is improvement in aeration right base. Small residual streaky right base laterally atelectasis or infiltrate. No pneumothorax.  Procedures None  Hospital Course:  61 year-old male with a history of right rib fractures, tobacco use, and COPD who presented to [facility] after a ground level fall. There was no loss of consciousness. At presentation patient was complaining of left-sided chest pain. Work-up significant for multiple left-sided rib fractures and the patient was admitted to the step-down unit for observation, pain control, and incentive spirometry. He had issues with hypoxia during his hospital admission which resolved with better pain control, duo-nebs, and addition of flutter valve. On 08/10/2016  the patient was afebrile, vitals stable, pain controlled, mobilizing with therapies, tolerating oral intake, and clinically stable for discharge home. Patient has been off O2 for the entire afternoon without problems. His oxygen therapy will be follow-up outpatient by his pulmonologist, Dr. Melvyn Novas.  Physical Exam: General:  Alert, NAD, pleasant, comfortable Card: regular rate and rhythm Abd:  Soft, ND, NT, +BS, no HSM Pulm: on room air Ext: No erythema, edema, or tenderness  Allergies as of 08/10/2016   No Known Allergies     Medication List    TAKE these medications   acetaminophen 325 MG tablet Commonly known as:  TYLENOL Take 2 tablets (650 mg total) by mouth every 6 (six) hours as needed.   albuterol 108 (90 Base) MCG/ACT inhaler Commonly known as:  PROAIR HFA Inhale 2 puffs into the lungs every 6 (six) hours as needed for wheezing or shortness of breath.   aspirin 81 MG tablet Take 81 mg by mouth daily.   budesonide-formoterol 160-4.5 MCG/ACT inhaler Commonly known as:  SYMBICORT 2 puffs bid   cholecalciferol 1000 units tablet Commonly known as:  VITAMIN D  Take 1,000 Units by mouth daily.   folic acid 1 MG tablet Commonly known as:   FOLVITE Take 1 tablet (1 mg total) by mouth daily.   ibuprofen 400 MG tablet Commonly known as:  ADVIL,MOTRIN Take 1 tablet (400 mg total) by mouth every 4 (four) hours as needed.   KRILL OIL PO Take 1 Can by mouth daily.   levothyroxine 50 MCG tablet Commonly known as:  SYNTHROID, LEVOTHROID Take 1 tablet (50 mcg total) by mouth daily before breakfast. Wait 1 hour after taking before eating or drinking anything but water.   methocarbamol 500 MG tablet Commonly known as:  ROBAXIN Take 1 tablet (500 mg total) by mouth every 6 (six) hours as needed for muscle spasms.   NIFEdipine 90 MG 24 hr tablet Commonly known as:  PROCARDIA XL/ADALAT-CC TAKE 1 TABLET (90 MG TOTAL) BY MOUTH DAILY.   traMADol 50 MG tablet Commonly known as:  ULTRAM Take 2 tablets (100 mg total) by mouth every 6 (six) hours as needed.   valsartan-hydrochlorothiazide 320-25 MG tablet Commonly known as:  DIOVAN-HCT TAKE 1 TABLET BY MOUTH DAILY.   vitamin B-12 250 MCG tablet Commonly known as:  CYANOCOBALAMIN Take 250 mcg by mouth daily.        Follow-up Information    [facility]. Call.   Why:  as needed Contact information: [address] [address] [phone]       Christinia Gully, MD. Schedule an appointment as soon as possible for a visit.   Specialty:  Pulmonary Disease Why:  1-2 weeks for follow up regarding your oxygen therapy. Contact information: [address] [place] [address] [phone]           Signed: Clovis Riley MD

## 2016-08-10 MED ORDER — METHOCARBAMOL 500 MG PO TABS: 500.0000 mg | ORAL_TABLET | Freq: Four times a day (QID) | ORAL | 0 refills | Status: DC | PRN

## 2016-08-10 MED ORDER — TRAMADOL HCL 50 MG PO TABS: 100.0000 mg | ORAL_TABLET | Freq: Four times a day (QID) | ORAL | 0 refills | Status: DC | PRN

## 2016-08-10 NOTE — Progress Notes (Signed)
Patient ID: Frank Rosales, male   DOB: 1956/05/12, 61 y.o.   MRN: [id number]   Left rib pain  Subjective: Sneeze last night and has some increased discomfort left chest. Able to ambulate with PT yesterday without difficulty and overall feels he is getting better. Still on 3 L of  Objective: Vital signs in last 24 hours: Temp:  [97.3 F (36.3 C)-98.1 F (36.7 C)] 97.3 F (36.3 C) (03/30 0935) Pulse Rate:  [85-97] 85 (03/30 0935) Resp:  [16-20] 18 (03/30 0935) BP: (109-125)/(70-92) 125/86 (03/30 0935) SpO2:  [91 %-98 %] 95 % (03/30 0935) Last BM Date:  (Unknown)  Intake/Output from previous day: 03/29 0701 - 03/30 0700 In: 900 [P.O.:900] Out: 1275 [Urine:1275] Intake/Output this shift: Total I/O In: 240 [P.O.:240] Out: 0   General appearance: alert, cooperative and no distress Resp: No increased work of breathing. Minimal bilateral rhonchi. Chest wall: no tenderness, left sided chest wall tenderness, Without crepitance  Lab Results:   Recent Labs  08/09/16 0515  WBC 9.0  HGB 14.6  HCT 40.8  PLT 175   BMET  Recent Labs  08/08/16 0341  NA 125*  K 3.9  CL 90*  CO2 25  GLUCOSE 102*  BUN 15  CREATININE 0.91  CALCIUM 8.5*     Studies/Results: Dg Chest Port 1 View  Result Date: 08/09/2016 CLINICAL DATA:  COPD, fall, left rib fractures EXAM: PORTABLE CHEST 1 VIEW COMPARISON:  08/08/2016 FINDINGS: Cardiomediastinal silhouette is stable. Persistent small left pleural effusion with left basilar atelectasis or lung contusion. Multiple left rib fractures and subcutaneous emphysema left chest wall again noted. Old right rib fractures are stable. There is improvement in aeration right base. Small residual streaky right base laterally atelectasis or infiltrate. No pneumothorax. IMPRESSION: Persistent small left pleural effusion with left basilar atelectasis or lung contusion. Multiple left rib fractures and subcutaneous emphysema left chest wall again noted. Old right rib  fractures are stable. There is improvement in aeration right base. Small residual streaky right base laterally atelectasis or infiltrate. No pneumothorax. Electronically Signed   By: Lahoma Crocker M.D.   On: 08/09/2016 12:38    Anti-infectives: Anti-infectives    None      Assessment/Plan: Fall with multiple left rib fractures. COPD. Gradually improving. Still on 3 L of oxygen. Try to wean oxygen off today and anticipate discharge possibly later today or tomorrow.    LOS: 5 days    Almina Schul T 08/10/2016

## 2016-08-10 NOTE — Progress Notes (Signed)
Frank Rosales to be D/C'd  per MD order. Discussed with the patient and all questions fully answered.  VSS, Skin clean, dry and intact without evidence of skin break down, no evidence of skin tears noted.  IV catheter discontinued intact. Site without signs and symptoms of complications. Dressing and pressure applied.  An After Visit Summary was printed and given to the patient. Patient received prescription.  D/c education completed with patient/family including follow up instructions, medication list, d/c activities limitations if indicated, with other d/c instructions as indicated by MD - patient able to verbalize understanding, all questions fully answered.   Patient instructed to return to ED, call 911, or call MD for any changes in condition.   Patient to be escorted via [place], and D/C home via private auto.

## 2016-08-10 NOTE — Progress Notes (Signed)
qPhysical Therapy Treatment Patient Details Name: Frank Rosales MRN: [id number] DOB: Jul 03, 1955 Today's Date: 08/10/2016    History of Present Illness Pt is a 61 y.o. male admitted after fall on log at home with 5-8 left rib fx. PMhx: prior rt rib fx, cOPD, HTN, hypothyroidism, anxiety, Asthma    PT Comments    Patient is making good progress with PT.  From a mobility standpoint anticipate patient will be ready for DC home. Pt able to ambulate 650 feet with RW and supervision. Pt ambulated on RA with SaO2 between 91-95% O2. Pt will not need additional PT care after discharge.       Follow Up Recommendations  No PT follow up     Equipment Recommendations  None recommended by PT    Recommendations for Other Services       Precautions / Restrictions Precautions Precautions: Fall Precaution Comments: Watch O2 Restrictions Weight Bearing Restrictions: No    Mobility  Bed Mobility               General bed mobility comments: in recliner at entry  Transfers Overall transfer level: Needs assistance Equipment used: Rolling walker (2 wheeled) Transfers: Sit to/from Stand Sit to Stand: Modified independent (Device/Increase time)         General transfer comment: steady   Ambulation/Gait Ambulation/Gait assistance: Supervision Ambulation Distance (Feet): 650 Feet Assistive device: Rolling walker (2 wheeled) Gait Pattern/deviations: WFL(Within Functional Limits) Gait velocity: moderate Gait velocity interpretation: >2.62 ft/sec, indicative of independent community ambulator General Gait Details: slow, steady gait      Balance Overall balance assessment: History of Falls   Sitting balance-Leahy Scale: Good       Standing balance-Leahy Scale: Good Standing balance comment: able to stand without assistance and adjust his clothing                             Cognition Arousal/Alertness: Awake/alert Behavior During Therapy: WFL for tasks  assessed/performed Overall Cognitive Status: Within Functional Limits for tasks assessed                                               Pertinent Vitals/Pain Pain Assessment: Faces Faces Pain Scale: Hurts a little bit Pain Descriptors / Indicators: Tightness;Guarding Pain Intervention(s): Limited activity within patient's tolerance;Monitored during session           PT Goals (current goals can now be found in the care plan section) Acute Rehab PT Goals PT Goal Formulation: With patient Time For Goal Achievement: 08/20/16 Potential to Achieve Goals: Good Progress towards PT goals: Progressing toward goals    Frequency    Min 3X/week      PT Plan Current plan remains appropriate       End of Session Equipment Utilized During Treatment: Gait belt Activity Tolerance: Patient tolerated treatment well Patient left: in chair;with call bell/phone within reach;with family/visitor present Nurse Communication: Mobility status PT Visit Diagnosis: Difficulty in walking, not elsewhere classified (R26.2);Pain Pain - Right/Left: Left Pain - part of body:  (ribcage)  VSS     Time: 0092-3300 PT Time Calculation (min) (ACUTE ONLY): 24 min  Charges:  $Gait Training: 8-22 mins                    G Codes:  Azir Muzyka B. Migdalia Dk PT, DPT Acute Rehabilitation  [phone] Pager [phone]    [place] 08/10/2016, 5:43 PM

## 2016-08-12 DIAGNOSIS — S2249XA Multiple fractures of ribs, unspecified side, initial encounter for closed fracture: Secondary | ICD-10-CM

## 2016-08-12 HISTORY — DX: Multiple fractures of ribs, unspecified side, initial encounter for closed fracture: S22.49XA

## 2016-08-16 ENCOUNTER — Ambulatory Visit: Payer: 59 | Admitting: Internal Medicine

## 2016-08-20 ENCOUNTER — Emergency Department (HOSPITAL_COMMUNITY): Payer: 59

## 2016-08-20 ENCOUNTER — Inpatient Hospital Stay (HOSPITAL_COMMUNITY)
Admission: EM | Admit: 2016-08-20 | Discharge: 2016-08-30 | DRG: 164 | Disposition: A | Discharge status: Home / Self Care | Payer: 59 | Attending: Thoracic Surgery (Cardiothoracic Vascular Surgery) | Admitting: Thoracic Surgery (Cardiothoracic Vascular Surgery)

## 2016-08-20 ENCOUNTER — Encounter (HOSPITAL_COMMUNITY): Payer: Self-pay | Admitting: Emergency Medicine

## 2016-08-20 DIAGNOSIS — D62 Acute posthemorrhagic anemia: Secondary | ICD-10-CM | POA: Diagnosis not present

## 2016-08-20 DIAGNOSIS — E7800 Pure hypercholesterolemia, unspecified: Secondary | ICD-10-CM | POA: Diagnosis present

## 2016-08-20 DIAGNOSIS — E039 Hypothyroidism, unspecified: Secondary | ICD-10-CM | POA: Diagnosis present

## 2016-08-20 DIAGNOSIS — Z09 Encounter for follow-up examination after completed treatment for conditions other than malignant neoplasm: Secondary | ICD-10-CM

## 2016-08-20 DIAGNOSIS — S271XXA Traumatic hemothorax, initial encounter: Secondary | ICD-10-CM | POA: Diagnosis not present

## 2016-08-20 DIAGNOSIS — Z8249 Family history of ischemic heart disease and other diseases of the circulatory system: Secondary | ICD-10-CM

## 2016-08-20 DIAGNOSIS — J95812 Postprocedural air leak: Secondary | ICD-10-CM

## 2016-08-20 DIAGNOSIS — E871 Hypo-osmolality and hyponatremia: Secondary | ICD-10-CM | POA: Diagnosis present

## 2016-08-20 DIAGNOSIS — M1990 Unspecified osteoarthritis, unspecified site: Secondary | ICD-10-CM | POA: Diagnosis present

## 2016-08-20 DIAGNOSIS — F419 Anxiety disorder, unspecified: Secondary | ICD-10-CM | POA: Diagnosis present

## 2016-08-20 DIAGNOSIS — I10 Essential (primary) hypertension: Secondary | ICD-10-CM | POA: Diagnosis present

## 2016-08-20 DIAGNOSIS — W19XXXA Unspecified fall, initial encounter: Secondary | ICD-10-CM | POA: Diagnosis present

## 2016-08-20 DIAGNOSIS — J9382 Other air leak: Secondary | ICD-10-CM | POA: Diagnosis not present

## 2016-08-20 DIAGNOSIS — Z825 Family history of asthma and other chronic lower respiratory diseases: Secondary | ICD-10-CM

## 2016-08-20 DIAGNOSIS — Z86010 Personal history of colonic polyps: Secondary | ICD-10-CM

## 2016-08-20 DIAGNOSIS — S2242XA Multiple fractures of ribs, left side, initial encounter for closed fracture: Secondary | ICD-10-CM | POA: Diagnosis present

## 2016-08-20 DIAGNOSIS — E876 Hypokalemia: Secondary | ICD-10-CM | POA: Diagnosis present

## 2016-08-20 DIAGNOSIS — R062 Wheezing: Secondary | ICD-10-CM

## 2016-08-20 DIAGNOSIS — J939 Pneumothorax, unspecified: Secondary | ICD-10-CM

## 2016-08-20 DIAGNOSIS — J61 Pneumoconiosis due to asbestos and other mineral fibers: Secondary | ICD-10-CM | POA: Diagnosis present

## 2016-08-20 DIAGNOSIS — Z7951 Long term (current) use of inhaled steroids: Secondary | ICD-10-CM

## 2016-08-20 DIAGNOSIS — R0781 Pleurodynia: Secondary | ICD-10-CM

## 2016-08-20 DIAGNOSIS — Z4682 Encounter for fitting and adjustment of non-vascular catheter: Secondary | ICD-10-CM

## 2016-08-20 DIAGNOSIS — R079 Chest pain, unspecified: Secondary | ICD-10-CM | POA: Diagnosis not present

## 2016-08-20 DIAGNOSIS — J449 Chronic obstructive pulmonary disease, unspecified: Secondary | ICD-10-CM

## 2016-08-20 DIAGNOSIS — J90 Pleural effusion, not elsewhere classified: Secondary | ICD-10-CM | POA: Diagnosis present

## 2016-08-20 DIAGNOSIS — M8580 Other specified disorders of bone density and structure, unspecified site: Secondary | ICD-10-CM | POA: Diagnosis present

## 2016-08-20 DIAGNOSIS — J9811 Atelectasis: Secondary | ICD-10-CM | POA: Diagnosis not present

## 2016-08-20 DIAGNOSIS — F17210 Nicotine dependence, cigarettes, uncomplicated: Secondary | ICD-10-CM | POA: Diagnosis present

## 2016-08-20 DIAGNOSIS — Z801 Family history of malignant neoplasm of trachea, bronchus and lung: Secondary | ICD-10-CM

## 2016-08-20 DIAGNOSIS — Z79899 Other long term (current) drug therapy: Secondary | ICD-10-CM

## 2016-08-20 DIAGNOSIS — Z7982 Long term (current) use of aspirin: Secondary | ICD-10-CM

## 2016-08-20 DIAGNOSIS — J942 Hemothorax: Secondary | ICD-10-CM

## 2016-08-20 HISTORY — DX: Multiple fractures of ribs, unspecified side, initial encounter for closed fracture: S22.49XA

## 2016-08-20 LAB — BASIC METABOLIC PANEL
Anion gap: 14 (ref 5–15)
BUN: 8 mg/dL (ref 6–20)
CALCIUM: 8.5 mg/dL — AB (ref 8.9–10.3)
CHLORIDE: 90 mmol/L — AB (ref 101–111)
CO2: 23 mmol/L (ref 22–32)
CREATININE: 0.91 mg/dL (ref 0.61–1.24)
GFR calc non Af Amer: >60 mL/min (ref >60)
Glucose, Bld: 127 mg/dL — ABNORMAL HIGH (ref 65–99)
Potassium: 3.3 mmol/L — ABNORMAL LOW (ref 3.5–5.1)
Sodium: 127 mmol/L — ABNORMAL LOW (ref 135–145)

## 2016-08-20 LAB — CBC
HCT: 40.2 % (ref 39.0–52.0)
Hemoglobin: 14.2 g/dL (ref 13.0–17.0)
MCH: 34.2 pg — AB (ref 26.0–34.0)
MCHC: 35.3 g/dL (ref 30.0–36.0)
MCV: 96.9 fL (ref 78.0–100.0)
PLATELETS: 390 10*3/uL (ref 150–400)
RBC: 4.15 MIL/uL — AB (ref 4.22–5.81)
RDW: 13 % (ref 11.5–15.5)
WBC: 12.4 10*3/uL — ABNORMAL HIGH (ref 4.0–10.5)

## 2016-08-20 LAB — I-STAT TROPONIN, ED: TROPONIN I, POC: 0 ng/mL (ref 0.00–0.08)

## 2016-08-20 MED ORDER — SODIUM CHLORIDE 0.9 % IV BOLUS (SEPSIS)
500.0000 mL | Freq: Once | INTRAVENOUS | Status: AC
  Administered 2016-08-21: 500 mL via INTRAVENOUS

## 2016-08-20 MED ORDER — MORPHINE SULFATE (PF) 4 MG/ML IV SOLN
6.0000 mg | Freq: Once | INTRAVENOUS | Status: AC
  Administered 2016-08-21: 6 mg via INTRAVENOUS
  Filled 2016-08-20: qty 2

## 2016-08-20 NOTE — ED Provider Notes (Signed)
[facility] DEPT Provider Note   CSN: [id number] Arrival date & time: 08/20/16  2001  By signing my name below, I, Julien Nordmann, attest that this documentation has been prepared under the direction and in the presence of Jola Schmidt, MD.  Electronically Signed: Julien Nordmann, ED Scribe. 08/20/16. 11:50 PM.    History   Chief Complaint Chief Complaint  Patient presents with  . Chest Pain  . Shortness of Breath    The history is provided by the patient. No language interpreter was used.   HPI Comments: Frank Rosales is a 61 y.o. male who has a PMhx of COPD, high cholesterol, hypothyroidism, and HTN presents to the Emergency Department complaining of acute onset, mid-sternum chest pain that radiates to his left lower chest that began ~ 5:30 pm (approx. 6 hours ago). He reports associated pleurisy when breathing. Per chart review, pt was seen on 08/05/16 for chest pain s/p a mechanical fall. Pt had imaging performed which showed mild left pleural effusion and mildly displaces left fifth, sixth, seventh, and eighth rib fractures. This led to pt's admission in which he was discharged on 08/10/16. He expresses that after he was discharged, he was at home without any complications.  He says when he lays down, he feels as if "something is flipping up and down in my chest". Pt has not had any medication for to alleviate his current pain. He expresses that he is currently on SpO2 of 2L/min [place], which he was not prior to his admission. He has a follow up appt with [facility] in the morning. He denies loss of appetite. Pt has no known drug allergies.     Past Medical History:  Diagnosis Date  . Anxiety   . Arthritis    "neck; lower back; right hip; right knee" (10/18/2014)  . Asthma   . Colon polyps   . COPD (chronic obstructive pulmonary disease) ([place])   . High blood pressure   . High cholesterol   . Hilar adenopathy   . Hypothyroidism     Patient Active Problem List   Diagnosis Date Noted  . Rib fractures 08/05/2016  . Asbestosis ([place]) 02/16/2016  . Sleep disturbance 07/06/2015  . Alcohol abuse 10/19/2014  . GAD (generalized anxiety disorder) 07/01/2014  . Hypothyroidism 12/03/2013  . Osteopenia 03/11/2013  . Cigarette smoker 07/12/2011  . Essential hypertension   . COPD GOLD III    . High cholesterol   . Hilar adenopathy     Past Surgical History:  Procedure Laterality Date  . BACK SURGERY    . epidural injections    . FIXATION KYPHOPLASTY LUMBAR SPINE     "L1"  . KNEE ARTHROSCOPY Right 05/14/2010  . WISDOM TOOTH EXTRACTION         Home Medications    Prior to Admission medications   Medication Sig Start Date End Date Taking? Authorizing Provider  acetaminophen (TYLENOL) 325 MG tablet Take 2 tablets (650 mg total) by mouth every 6 (six) hours as needed. 08/09/16   Darci Current Simaan, PA-C  albuterol (PROAIR HFA) 108 (90 Base) MCG/ACT inhaler Inhale 2 puffs into the lungs every 6 (six) hours as needed for wheezing or shortness of breath. 12/22/15   Timmothy Euler, MD  aspirin 81 MG tablet Take 81 mg by mouth daily.     Historical Provider, MD  budesonide-formoterol [facility]) 160-4.5 MCG/ACT inhaler 2 puffs bid 04/10/16   Claretta Fraise, MD  cholecalciferol (VITAMIN D) 1000 UNITS tablet Take 1,000 Units by  mouth daily.     Historical Provider, MD  folic acid (FOLVITE) 1 MG tablet Take 1 tablet (1 mg total) by mouth daily. 02/06/13   Lysbeth Penner, FNP  ibuprofen (ADVIL,MOTRIN) 400 MG tablet Take 1 tablet (400 mg total) by mouth every 4 (four) hours as needed. 08/09/16   Elizabeth S Simaan, PA-C  KRILL OIL PO Take 1 Can by mouth daily.    Historical Provider, MD  levothyroxine (SYNTHROID, LEVOTHROID) 50 MCG tablet Take 1 tablet (50 mcg total) by mouth daily before breakfast. Wait 1 hour after taking before eating or drinking anything but water. 12/05/15   Claretta Fraise, MD  methocarbamol (ROBAXIN) 500 MG tablet Take 1 tablet (500 mg total)  by mouth every 6 (six) hours as needed for muscle spasms. 08/10/16   Clovis Riley, MD  NIFEdipine (PROCARDIA XL/ADALAT-CC) 90 MG 24 hr tablet TAKE 1 TABLET (90 MG TOTAL) BY MOUTH DAILY. 04/17/16   Claretta Fraise, MD  traMADol (ULTRAM) 50 MG tablet Take 2 tablets (100 mg total) by mouth every 6 (six) hours as needed. 08/10/16   Clovis Riley, MD  valsartan-hydrochlorothiazide (DIOVAN-HCT) 320-25 MG tablet TAKE 1 TABLET BY MOUTH DAILY. 06/02/16   Claretta Fraise, MD  vitamin B-12 (CYANOCOBALAMIN) 250 MCG tablet Take 250 mcg by mouth daily.    Historical Provider, MD    Family History Family History  Problem Relation Age of Onset  . Emphysema Father   . Lung cancer Father   . Heart disease Brother   . Heart failure Mother     Social History Social History  Substance Use Topics  . Smoking status: Current Every Day Smoker    Packs/day: 1.75    Years: 40.00    Types: Cigarettes    Start date: 05/14/1973  . Smokeless tobacco: Never Used     Comment:     . Alcohol use 25.2 oz/week    42 Cans of beer per week     Comment: 10/18/2014 "6 beers per day"     Allergies   Patient has no known allergies.   Review of Systems Review of Systems  A complete 10 system review of systems was obtained and all systems are negative except as noted in the HPI and PMH.   Physical Exam Updated Vital Signs BP 119/76 (BP Location: Left Arm)   Pulse (!) 105   Temp 97.7 F (36.5 C) (Oral)   Resp 16   Ht 5\' 9"  (1.753 m)   Wt 200 lb (90.7 kg)   SpO2 98%   BMI 29.53 kg/m   Physical Exam  Constitutional: He is oriented to person, place, and time. He appears well-developed and well-nourished.  HENT:  Head: Normocephalic and atraumatic.  Eyes: EOM are normal.  Neck: Normal range of motion.  Cardiovascular: Normal rate, regular rhythm, normal heart sounds and intact distal pulses.   Pulmonary/Chest: Effort normal and breath sounds normal. No respiratory distress.  Abdominal: Soft. He exhibits no  distension. There is no tenderness.  Musculoskeletal: Normal range of motion.  Neurological: He is alert and oriented to person, place, and time.  Skin: Skin is warm and dry.  Psychiatric: He has a normal mood and affect. Judgment normal.  Nursing note and vitals reviewed.    ED Treatments / Results  DIAGNOSTIC STUDIES: Oxygen Saturation is 98% on RA, normal by my interpretation.  COORDINATION OF CARE:  11:45 PM Discussed treatment plan with pt at bedside and pt agreed to plan.  Labs (all labs  ordered are listed, but only abnormal results are displayed) Labs Reviewed  BASIC METABOLIC PANEL - Abnormal; Notable for the following:       Result Value   Sodium 127 (*)    Potassium 3.3 (*)    Chloride 90 (*)    Glucose, Bld 127 (*)    Calcium 8.5 (*)    All other components within normal limits  CBC - Abnormal; Notable for the following:    WBC 12.4 (*)    RBC 4.15 (*)    MCH 34.2 (*)    All other components within normal limits  HEMOGLOBIN AND HEMATOCRIT, BLOOD - Abnormal; Notable for the following:    Hemoglobin 12.2 (*)    HCT 34.6 (*)    All other components within normal limits  I-STAT TROPOININ, ED  TYPE AND SCREEN   Hemoglobin  Date Value Ref Range Status  08/21/2016 12.2 (L) 13.0 - 17.0 g/dL Final  08/20/2016 14.2 13.0 - 17.0 g/dL Final  08/09/2016 14.6 13.0 - 17.0 g/dL Final  08/07/2016 15.9 13.0 - 17.0 g/dL Final     EKG  EKG Interpretation  Date/Time:  Monday August 20 2016 20:15:59 EDT Ventricular Rate:  112 PR Interval:  170 QRS Duration: 80 QT Interval:  312 QTC Calculation: 425 R Axis:   35 Text Interpretation:  Sinus tachycardia Low voltage QRS Cannot rule out Anterior infarct , age undetermined Abnormal ECG No significant change was found Confirmed by Saidee Geremia  MD, Lennette Bihari ([id number]) on 08/20/2016 11:36:55 PM       Radiology Dg Chest 2 View  Result Date: 08/20/2016 CLINICAL DATA:  Left-sided chest pain EXAM: CHEST  2 VIEW COMPARISON:  Chest CT  08/05/2016, CXR 08/09/2016 FINDINGS: Obscuration of the left hemidiaphragm and portions of the left heart border secondary to presumed increase in left pleural effusion and compressive atelectasis. This spans 3-1/2 half vertebral body heights on the lateral view. Left fifth through eighth rib fractures are again noted with minimal displacement and overlying subcutaneous emphysema. No pneumothorax identified. No mediastinal shift. Right lung remains clear. Subacute to chronic appearing fractures on the right involving the eighth and ninth ribs. No aortic aneurysm. IMPRESSION: Acute minimally displaced known left fifth through eighth rib fractures. Subacute to chronic right eighth and ninth rib fractures. Interval increase in left moderate pleural effusion and compressive atelectasis. No appreciable pneumothorax identified. Subcutaneous emphysema overlying the rib fractures. Electronically Signed   By: Ashley Royalty M.D.   On: 08/20/2016 21:50   Ct Angio Chest Pe W And/or Wo Contrast  Result Date: 08/21/2016 CLINICAL DATA:  Acute onset of left-sided chest pain and shortness of breath. Initial encounter. EXAM: CT ANGIOGRAPHY CHEST WITH CONTRAST TECHNIQUE: Multidetector CT imaging of the chest was performed using the standard protocol during bolus administration of intravenous contrast. Multiplanar CT image reconstructions and MIPs were obtained to evaluate the vascular anatomy. CONTRAST:  100 mL of Isovue 370 IV contrast COMPARISON:  Chest radiograph performed 08/20/2016, and CT of the chest performed 08/05/2016 FINDINGS: Cardiovascular: The heart is normal in size. The thoracic aorta is grossly unremarkable. Minimal calcification is seen along the proximal great vessels. Mediastinum/Nodes: A 1.5 cm azygoesophageal recess node is seen. No pericardial effusion is identified. The thyroid gland is grossly unremarkable. No axillary lymphadenopathy is appreciated. Lungs/Pleura: There has been significant interval increase  in a moderate to large loculated left-sided pleural effusion. New areas of increased attenuation are noted within the effusion, suspicious for interval hemorrhage into the left hemithorax since the prior  study. Underlying atelectasis is noted. Mild right basilar atelectasis is noted. Scattered peripheral blebs are noted at the lung apices. No pneumothorax is seen. No dominant mass is identified. Upper Abdomen: The visualized portions of the liver and spleen are grossly unremarkable. The visualized portions of the pancreas, adrenal glands and kidneys are within normal limits. Mild nonspecific perinephric stranding is noted bilaterally. Musculoskeletal: Fractures of the left lateral fifth through eighth ribs are again noted, with comminution at the left sixth and seventh ribs. Associated soft tissue hemorrhage is seen along the left chest wall, with residual soft tissue air along the left chest wall. There are chronic compression deformities of vertebral bodies T12 and L1, with changes of vertebroplasty at L1. Review of the MIP images confirms the above findings. IMPRESSION: 1. Significant interval increase and moderate to large loculated right-sided pleural effusion. New areas of increased attenuation within the effusion, suspicious for interval hemorrhage into the left hemithorax since the prior study. Underlying atelectasis noted. Infection cannot be entirely excluded, though there is no evidence of empyema at this time. 2. Fractures of the left lateral fifth through eighth ribs again noted, with comminution at the left sixth and seventh ribs. Associated soft tissue hemorrhage along the left chest wall, with residual soft tissue air along the left chest wall. 3. Mild right basilar atelectasis. Scattered peripheral blebs at the lung apices. 4. 1.5 cm azygoesophageal recess node noted. 5. Chronic compression deformities of vertebral bodies T12 and L1, with changes of vertebroplasty at L1. These results were called by  telephone at the time of interpretation on 08/21/2016 at 2:38 am to Dr. Jola Schmidt, who verbally acknowledged these results. Electronically Signed   By: Garald Balding M.D.   On: 08/21/2016 02:40    Procedures Procedures (including critical care time)  Medications Ordered in ED Medications  iopamidol (ISOVUE-370) 76 % injection (not administered)  fentaNYL (SUBLIMAZE) injection 100 mcg (100 mcg Intravenous Given 08/21/16 0318)  morphine 4 MG/ML injection 6 mg (6 mg Intravenous Given 08/21/16 0044)  sodium chloride 0.9 % bolus 500 mL (0 mLs Intravenous Stopped 08/21/16 0118)  iopamidol (ISOVUE-370) 76 % injection (100 mLs  Contrast Given 08/21/16 0158)     Initial Impression / Assessment and Plan / ED Course  I have reviewed the triage vital signs and the nursing notes.  Pertinent labs & imaging results that were available during my care of the patient were reviewed by me and considered in my medical decision making (see chart for details).     Increasing left-sided pleural effusion likely hemothorax with new worsening pain at this time.  Patient may benefit from evacuation of the left hemothorax from a pain standpoint.  He continues to have presumably significant pain at this time.  He did have a vagal episode associated with morphine resulting in bradycardia and hypotension that resolved with time and fluids.  I spoke with Dr. Georgette Dover, surgery, who will have the trauma team evaluate in the ER for definitive treatment  Final Clinical Impressions(s) / ED Diagnoses   Final diagnoses:  Hemothorax, left  Pleurodynia   I personally performed the services described in this documentation, which was scribed in my presence. The recorded information has been reviewed and is accurate.    New Prescriptions New Prescriptions   No medications on file     Jola Schmidt, MD 08/21/16 [phone]

## 2016-08-20 NOTE — ED Triage Notes (Signed)
Pt presents with worsening L sided CP radiating to midsternum accompanied by SOB, worse with deep inspiration and coughing. Pt reports 4 broken ribs on the L side from a fall 2-3 weeks ago, was hospitalized for 3 days for it. Pt denies cough, diaphoresis, N/V, dizziness. Pt states pain is worse with any movement.

## 2016-08-21 ENCOUNTER — Encounter (HOSPITAL_COMMUNITY): Payer: Self-pay | Admitting: Radiology

## 2016-08-21 ENCOUNTER — Emergency Department (HOSPITAL_COMMUNITY): Payer: 59

## 2016-08-21 ENCOUNTER — Ambulatory Visit: Payer: 59 | Admitting: Internal Medicine

## 2016-08-21 DIAGNOSIS — Z8249 Family history of ischemic heart disease and other diseases of the circulatory system: Secondary | ICD-10-CM | POA: Diagnosis not present

## 2016-08-21 DIAGNOSIS — Z801 Family history of malignant neoplasm of trachea, bronchus and lung: Secondary | ICD-10-CM | POA: Diagnosis not present

## 2016-08-21 DIAGNOSIS — R079 Chest pain, unspecified: Secondary | ICD-10-CM | POA: Diagnosis present

## 2016-08-21 DIAGNOSIS — Z86010 Personal history of colonic polyps: Secondary | ICD-10-CM | POA: Diagnosis not present

## 2016-08-21 DIAGNOSIS — Z825 Family history of asthma and other chronic lower respiratory diseases: Secondary | ICD-10-CM | POA: Diagnosis not present

## 2016-08-21 DIAGNOSIS — W19XXXA Unspecified fall, initial encounter: Secondary | ICD-10-CM | POA: Diagnosis present

## 2016-08-21 DIAGNOSIS — Z79899 Other long term (current) drug therapy: Secondary | ICD-10-CM | POA: Diagnosis not present

## 2016-08-21 DIAGNOSIS — J90 Pleural effusion, not elsewhere classified: Secondary | ICD-10-CM | POA: Diagnosis not present

## 2016-08-21 DIAGNOSIS — J9382 Other air leak: Secondary | ICD-10-CM | POA: Diagnosis not present

## 2016-08-21 DIAGNOSIS — S271XXA Traumatic hemothorax, initial encounter: Secondary | ICD-10-CM | POA: Diagnosis not present

## 2016-08-21 DIAGNOSIS — I10 Essential (primary) hypertension: Secondary | ICD-10-CM | POA: Diagnosis not present

## 2016-08-21 DIAGNOSIS — J449 Chronic obstructive pulmonary disease, unspecified: Secondary | ICD-10-CM | POA: Diagnosis not present

## 2016-08-21 DIAGNOSIS — F17210 Nicotine dependence, cigarettes, uncomplicated: Secondary | ICD-10-CM | POA: Diagnosis present

## 2016-08-21 DIAGNOSIS — M1990 Unspecified osteoarthritis, unspecified site: Secondary | ICD-10-CM | POA: Diagnosis present

## 2016-08-21 DIAGNOSIS — E039 Hypothyroidism, unspecified: Secondary | ICD-10-CM | POA: Diagnosis not present

## 2016-08-21 DIAGNOSIS — J942 Hemothorax: Secondary | ICD-10-CM | POA: Diagnosis present

## 2016-08-21 DIAGNOSIS — D62 Acute posthemorrhagic anemia: Secondary | ICD-10-CM | POA: Diagnosis not present

## 2016-08-21 DIAGNOSIS — E7800 Pure hypercholesterolemia, unspecified: Secondary | ICD-10-CM | POA: Diagnosis not present

## 2016-08-21 DIAGNOSIS — E876 Hypokalemia: Secondary | ICD-10-CM | POA: Diagnosis present

## 2016-08-21 DIAGNOSIS — S2242XA Multiple fractures of ribs, left side, initial encounter for closed fracture: Secondary | ICD-10-CM | POA: Diagnosis not present

## 2016-08-21 DIAGNOSIS — M8580 Other specified disorders of bone density and structure, unspecified site: Secondary | ICD-10-CM | POA: Diagnosis present

## 2016-08-21 DIAGNOSIS — J9811 Atelectasis: Secondary | ICD-10-CM | POA: Diagnosis not present

## 2016-08-21 DIAGNOSIS — E871 Hypo-osmolality and hyponatremia: Secondary | ICD-10-CM | POA: Diagnosis not present

## 2016-08-21 DIAGNOSIS — Z7982 Long term (current) use of aspirin: Secondary | ICD-10-CM | POA: Diagnosis not present

## 2016-08-21 DIAGNOSIS — F419 Anxiety disorder, unspecified: Secondary | ICD-10-CM | POA: Diagnosis not present

## 2016-08-21 DIAGNOSIS — Z7951 Long term (current) use of inhaled steroids: Secondary | ICD-10-CM | POA: Diagnosis not present

## 2016-08-21 LAB — COMPREHENSIVE METABOLIC PANEL
ALK PHOS: 103 U/L (ref 38–126)
ALT: 28 U/L (ref 17–63)
AST: 23 U/L (ref 15–41)
Albumin: 3.6 g/dL (ref 3.5–5.0)
Anion gap: 7 (ref 5–15)
BILIRUBIN TOTAL: 1.2 mg/dL (ref 0.3–1.2)
BUN: 10 mg/dL (ref 6–20)
CALCIUM: 8.4 mg/dL — AB (ref 8.9–10.3)
CO2: 25 mmol/L (ref 22–32)
CREATININE: 0.99 mg/dL (ref 0.61–1.24)
Chloride: 95 mmol/L — ABNORMAL LOW (ref 101–111)
Glucose, Bld: 134 mg/dL — ABNORMAL HIGH (ref 65–99)
Potassium: 5 mmol/L (ref 3.5–5.1)
Sodium: 127 mmol/L — ABNORMAL LOW (ref 135–145)
Total Protein: 6 g/dL — ABNORMAL LOW (ref 6.5–8.1)

## 2016-08-21 LAB — TYPE AND SCREEN
ABO/RH(D): A POS
ANTIBODY SCREEN: NEGATIVE

## 2016-08-21 LAB — CBC
HCT: 38.2 % — ABNORMAL LOW (ref 39.0–52.0)
HEMOGLOBIN: 13.2 g/dL (ref 13.0–17.0)
MCH: 33.7 pg (ref 26.0–34.0)
MCHC: 34.6 g/dL (ref 30.0–36.0)
MCV: 97.4 fL (ref 78.0–100.0)
Platelets: 369 10*3/uL (ref 150–400)
RBC: 3.92 MIL/uL — AB (ref 4.22–5.81)
RDW: 13.4 % (ref 11.5–15.5)
WBC: 17.1 10*3/uL — ABNORMAL HIGH (ref 4.0–10.5)

## 2016-08-21 LAB — BLOOD GAS, ARTERIAL
ACID-BASE DEFICIT: 0.2 mmol/L (ref 0.0–2.0)
Bicarbonate: 23.9 mmol/L (ref 20.0–28.0)
DRAWN BY: 257081
FIO2: 21
O2 SAT: 87.7 %
PCO2 ART: 39 mmHg (ref 32.0–48.0)
Patient temperature: 98.9
pH, Arterial: 7.405 (ref 7.350–7.450)
pO2, Arterial: 56.5 mmHg — ABNORMAL LOW (ref 83.0–108.0)

## 2016-08-21 LAB — HEMOGLOBIN AND HEMATOCRIT, BLOOD
HCT: 34.6 % — ABNORMAL LOW (ref 39.0–52.0)
Hemoglobin: 12.2 g/dL — ABNORMAL LOW (ref 13.0–17.0)

## 2016-08-21 LAB — URINALYSIS, ROUTINE W REFLEX MICROSCOPIC
BILIRUBIN URINE: NEGATIVE
Glucose, UA: NEGATIVE mg/dL
Hgb urine dipstick: NEGATIVE
Ketones, ur: NEGATIVE mg/dL
Leukocytes, UA: NEGATIVE
Nitrite: NEGATIVE
PH: 5 (ref 5.0–8.0)
Protein, ur: NEGATIVE mg/dL

## 2016-08-21 LAB — APTT: aPTT: 29 s (ref 24–36)

## 2016-08-21 LAB — PROTIME-INR
INR: 1.16
PROTHROMBIN TIME: 14.8 s (ref 11.4–15.2)

## 2016-08-21 LAB — ABO/RH: ABO/RH(D): A POS

## 2016-08-21 MED ORDER — ENOXAPARIN SODIUM 40 MG/0.4ML ~~LOC~~ SOLN: 40.0000 mg | SUBCUTANEOUS | Status: DC

## 2016-08-21 MED ORDER — IOPAMIDOL (ISOVUE-370) INJECTION 76%
INTRAVENOUS | Status: AC
  Administered 2016-08-21: 100 mL
  Filled 2016-08-21: qty 100

## 2016-08-21 MED ORDER — METHOCARBAMOL 500 MG PO TABS: 500.0000 mg | ORAL_TABLET | Freq: Four times a day (QID) | ORAL | Status: DC | PRN

## 2016-08-21 MED ORDER — POTASSIUM CHLORIDE IN NACL 20-0.9 MEQ/L-% IV SOLN
INTRAVENOUS | Status: DC
  Administered 2016-08-21 – 2016-08-22 (×3): via INTRAVENOUS
  Filled 2016-08-21 (×3): qty 1000

## 2016-08-21 MED ORDER — NIFEDIPINE ER OSMOTIC RELEASE 90 MG PO TB24
90.0000 mg | ORAL_TABLET | Freq: Every day | ORAL | Status: DC
  Administered 2016-08-21 – 2016-08-30 (×9): 90 mg via ORAL
  Filled 2016-08-21 (×10): qty 1

## 2016-08-21 MED ORDER — FENTANYL CITRATE (PF) 100 MCG/2ML IJ SOLN
50.0000 ug | INTRAMUSCULAR | Status: DC | PRN
  Administered 2016-08-21 – 2016-08-22 (×4): 100 ug via INTRAVENOUS
  Filled 2016-08-21 (×5): qty 2

## 2016-08-21 MED ORDER — VALSARTAN-HYDROCHLOROTHIAZIDE 320-25 MG PO TABS: 1.0000 | ORAL_TABLET | Freq: Every day | ORAL | Status: DC

## 2016-08-21 MED ORDER — MORPHINE SULFATE (PF) 2 MG/ML IV SOLN
1.0000 mg | INTRAVENOUS | Status: DC | PRN
  Administered 2016-08-21: 4 mg via INTRAVENOUS

## 2016-08-21 MED ORDER — MORPHINE SULFATE (PF) 2 MG/ML IV SOLN
2.0000 mg | INTRAVENOUS | Status: DC | PRN
  Administered 2016-08-21: 4 mg via INTRAVENOUS
  Filled 2016-08-21 (×2): qty 2

## 2016-08-21 MED ORDER — MOMETASONE FURO-FORMOTEROL FUM 200-5 MCG/ACT IN AERO
2.0000 | INHALATION_SPRAY | Freq: Two times a day (BID) | RESPIRATORY_TRACT | Status: DC
  Administered 2016-08-21 – 2016-08-30 (×15): 2 via RESPIRATORY_TRACT
  Filled 2016-08-21 (×3): qty 8.8

## 2016-08-21 MED ORDER — ONDANSETRON HCL 4 MG PO TABS: 4.0000 mg | ORAL_TABLET | Freq: Four times a day (QID) | ORAL | Status: DC | PRN

## 2016-08-21 MED ORDER — DEXTROSE 5 % IV SOLN
1.5000 g | INTRAVENOUS | Status: AC
  Administered 2016-08-22: 1.5 g via INTRAVENOUS
  Filled 2016-08-21: qty 1.5

## 2016-08-21 MED ORDER — LEVOTHYROXINE SODIUM 50 MCG PO TABS
50.0000 ug | ORAL_TABLET | Freq: Every day | ORAL | Status: DC
  Administered 2016-08-21 – 2016-08-30 (×10): 50 ug via ORAL
  Filled 2016-08-21 (×10): qty 1

## 2016-08-21 MED ORDER — IOPAMIDOL (ISOVUE-370) INJECTION 76%
INTRAVENOUS | Status: AC
  Filled 2016-08-21: qty 100

## 2016-08-21 MED ORDER — ONDANSETRON HCL 4 MG/2ML IJ SOLN: 4.0000 mg | Freq: Four times a day (QID) | INTRAMUSCULAR | Status: DC | PRN

## 2016-08-21 MED ORDER — FENTANYL CITRATE (PF) 100 MCG/2ML IJ SOLN
100.0000 ug | INTRAMUSCULAR | Status: AC | PRN
  Administered 2016-08-21 (×4): 100 ug via INTRAVENOUS
  Filled 2016-08-21 (×4): qty 2

## 2016-08-21 MED ORDER — ALBUTEROL SULFATE (2.5 MG/3ML) 0.083% IN NEBU
2.5000 mg | INHALATION_SOLUTION | Freq: Four times a day (QID) | RESPIRATORY_TRACT | Status: DC
  Administered 2016-08-21 (×4): 2.5 mg via RESPIRATORY_TRACT
  Filled 2016-08-21 (×4): qty 3

## 2016-08-21 MED ORDER — IRBESARTAN 300 MG PO TABS
300.0000 mg | ORAL_TABLET | Freq: Every day | ORAL | Status: DC
  Administered 2016-08-21 – 2016-08-30 (×9): 300 mg via ORAL
  Filled 2016-08-21 (×10): qty 1

## 2016-08-21 MED ORDER — ALBUTEROL SULFATE (2.5 MG/3ML) 0.083% IN NEBU
3.0000 mL | INHALATION_SOLUTION | Freq: Four times a day (QID) | RESPIRATORY_TRACT | Status: DC | PRN
  Administered 2016-08-22: 3 mL via RESPIRATORY_TRACT

## 2016-08-21 MED ORDER — HYDROCHLOROTHIAZIDE 25 MG PO TABS
25.0000 mg | ORAL_TABLET | Freq: Every day | ORAL | Status: DC
  Administered 2016-08-21 – 2016-08-30 (×9): 25 mg via ORAL
  Filled 2016-08-21 (×9): qty 1

## 2016-08-21 NOTE — Progress Notes (Signed)
Patient arrived from ED to 6n15, alert and oriented with family at bedside. IV noted to right arm, 02 on 2L, placed on tele and pulse ox, patient in moderate pain will give pain meds when appropriate. Oriented to room and staff will continue to monitor.

## 2016-08-21 NOTE — ED Notes (Signed)
MD Campos at bedside.  

## 2016-08-21 NOTE — ED Notes (Signed)
Report given to 9 [address] unit - Community education officer .

## 2016-08-21 NOTE — Care Management Note (Signed)
Case Management Note  Patient Details  Name: Frank Rosales MRN: [id number] Date of Birth: [birth year]  Subjective/Objective:    Readmission, dc 3/3/0/2018 after fall at home, Multiple rib fractures. Admitted with loculated pleural effusion, shortness of breath, scheduled VATS/thoracotomy 08/22/2016       Action/Plan: Discharge Planning: Pt lives at home with wife and dtr. Will continue to for dc needs.   PCP Claretta Fraise MD  Expected Discharge Date:                  Expected Discharge Plan:  [place]  In-House Referral:  NA  Discharge planning Services  CM Consult  Post Acute Care Choice:  Home Health Choice offered to:     DME Arranged:    DME Agency:     HH Arranged:    HH Agency:     Status of Service:  In process, will continue to follow  If discussed at Long Length of Stay Meetings, dates discussed:    Additional Comments:  Erenest Rasher, RN 08/21/2016, 3:59 PM

## 2016-08-21 NOTE — H&P (Signed)
History   Frank Rosales is an 61 y.o. male.   Chief Complaint:  Chief Complaint  Patient presents with  . Chest Pain  . Shortness of Breath    HPI 61 year-old male with a history of right rib fractures, tobacco use, and COPD who presented to [facility] after a recent admission for ground level fall on 08/05/16. There was no loss of consciousness. At presentation patient was complaining of left-sided chest pain. Work-up significant for multiple left-sided rib fractures and the patient was admitted to the step-down unit for observation, pain control, and incentive spirometry. He had issues with hypoxia during his hospital admission which resolved with better pain control, duo-nebs, and addition of flutter valve. On 08/10/2016  the patient was afebrile, vitals stable, pain controlled, mobilizing with therapies, tolerating oral intake, and clinically stable for discharge home. Patient had been off O2 for the entire afternoon without problems. His oxygen therapy will be follow-up outpatient by his pulmonologist, Dr. Melvyn Novas.  The patient's left chest pain had been improving, but tonight he had acute worsening of his left chest pain.  This is especially severe when he tries to take a deep breath.  He had not been on any pain medication.  Past Medical History:  Diagnosis Date  . Anxiety   . Arthritis    "neck; lower back; right hip; right knee" (10/18/2014)  . Asthma   . Colon polyps   . COPD (chronic obstructive pulmonary disease) ([place])   . High blood pressure   . High cholesterol   . Hilar adenopathy   . Hypothyroidism     Past Surgical History:  Procedure Laterality Date  . BACK SURGERY    . epidural injections    . FIXATION KYPHOPLASTY LUMBAR SPINE     "L1"  . KNEE ARTHROSCOPY Right 05/14/2010  . WISDOM TOOTH EXTRACTION      Family History  Problem Relation Age of Onset  . Emphysema Father   . Lung cancer Father   . Heart disease Brother   . Heart failure Mother    Social History:   reports that he has been smoking Cigarettes.  He started smoking about 43 years ago. He has a 70.00 pack-year smoking history. He has never used smokeless tobacco. He reports that he drinks about 25.2 oz of alcohol per week . He reports that he does not use drugs.  Allergies  No Known Allergies  Home Medications   Prior to Admission medications   Medication Sig Start Date End Date Taking? Authorizing Provider  albuterol (PROAIR HFA) 108 (90 Base) MCG/ACT inhaler Inhale 2 puffs into the lungs every 6 (six) hours as needed for wheezing or shortness of breath. 12/22/15  Yes Timmothy Euler, MD  aspirin 81 MG tablet Take 81 mg by mouth daily.    Yes Historical Provider, MD  budesonide-formoterol (SYMBICORT) 160-4.5 MCG/ACT inhaler Inhale 2 puffs into the lungs 2 (two) times daily.   Yes Historical Provider, MD  cholecalciferol (VITAMIN D) 1000 UNITS tablet Take 1,000 Units by mouth daily.    Yes Historical Provider, MD  folic acid (FOLVITE) 1 MG tablet Take 1 tablet (1 mg total) by mouth daily. 02/06/13  Yes Lysbeth Penner, FNP  Ibuprofen-Diphenhydramine HCl (ADVIL PM) 200-25 MG CAPS Take 1 capsule by mouth at bedtime as needed (for sleep).   Yes Historical Provider, MD  KRILL OIL PO Take 1 Can by mouth daily.   Yes Historical Provider, MD  levothyroxine (SYNTHROID, LEVOTHROID) 50 MCG tablet Take  1 tablet (50 mcg total) by mouth daily before breakfast. Wait 1 hour after taking before eating or drinking anything but water. 12/05/15  Yes Claretta Fraise, MD  methocarbamol (ROBAXIN) 500 MG tablet Take 1 tablet (500 mg total) by mouth every 6 (six) hours as needed for muscle spasms. 08/10/16  Yes Clovis Riley, MD  NIFEdipine (PROCARDIA XL/ADALAT-CC) 90 MG 24 hr tablet TAKE 1 TABLET (90 MG TOTAL) BY MOUTH DAILY. 04/17/16  Yes Claretta Fraise, MD  traMADol (ULTRAM) 50 MG tablet Take 2 tablets (100 mg total) by mouth every 6 (six) hours as needed. 08/10/16  Yes Clovis Riley, MD   valsartan-hydrochlorothiazide (DIOVAN-HCT) 320-25 MG tablet TAKE 1 TABLET BY MOUTH DAILY. 06/02/16  Yes Claretta Fraise, MD  vitamin B-12 (CYANOCOBALAMIN) 250 MCG tablet Take 250 mcg by mouth daily.   Yes Historical Provider, MD  acetaminophen (TYLENOL) 325 MG tablet Take 2 tablets (650 mg total) by mouth every 6 (six) hours as needed. Patient not taking: Reported on 08/21/2016 08/09/16   Jill Alexanders, PA-C  budesonide-formoterol [facility]) 160-4.5 MCG/ACT inhaler 2 puffs bid Patient not taking: Reported on 08/21/2016 04/10/16   Claretta Fraise, MD  ibuprofen (ADVIL,MOTRIN) 400 MG tablet Take 1 tablet (400 mg total) by mouth every 4 (four) hours as needed. Patient not taking: Reported on 08/21/2016 08/09/16   Jill Alexanders, PA-C     Trauma Course   Results for orders placed or performed during the hospital encounter of 08/20/16 (from the past 48 hour(s))  Basic metabolic panel     Status: Abnormal   Collection Time: 08/20/16  8:34 PM  Result Value Ref Range   Sodium 127 (L) 135 - 145 mmol/L   Potassium 3.3 (L) 3.5 - 5.1 mmol/L   Chloride 90 (L) 101 - 111 mmol/L   CO2 23 22 - 32 mmol/L   Glucose, Bld 127 (H) 65 - 99 mg/dL   BUN 8 6 - 20 mg/dL   Creatinine, Ser 0.91 0.61 - 1.24 mg/dL   Calcium 8.5 (L) 8.9 - 10.3 mg/dL   GFR calc non Af Amer >60 >60 mL/min   GFR calc Af Amer >60 >60 mL/min    Comment: (NOTE) The eGFR has been calculated using the CKD EPI equation. This calculation has not been validated in all clinical situations. eGFR's persistently <60 mL/min signify possible Chronic Kidney Disease.    Anion gap 14 5 - 15  CBC     Status: Abnormal   Collection Time: 08/20/16  8:34 PM  Result Value Ref Range   WBC 12.4 (H) 4.0 - 10.5 K/uL   RBC 4.15 (L) 4.22 - 5.81 MIL/uL   Hemoglobin 14.2 13.0 - 17.0 g/dL   HCT 40.2 39.0 - 52.0 %   MCV 96.9 78.0 - 100.0 fL   MCH 34.2 (H) 26.0 - 34.0 pg   MCHC 35.3 30.0 - 36.0 g/dL   RDW 13.0 11.5 - 15.5 %   Platelets 390 150 - 400 K/uL   I-stat troponin, ED     Status: None   Collection Time: 08/20/16  8:49 PM  Result Value Ref Range   Troponin i, poc 0.00 0.00 - 0.08 ng/mL   Comment 3            Comment: Due to the release kinetics of cTnI, a negative result within the first hours of the onset of symptoms does not rule out myocardial infarction with certainty. If myocardial infarction is still suspected, repeat the test at appropriate intervals.  Hemoglobin and hematocrit, blood     Status: Abnormal   Collection Time: 08/21/16  3:36 AM  Result Value Ref Range   Hemoglobin 12.2 (L) 13.0 - 17.0 g/dL   HCT 34.6 (L) 39.0 - 52.0 %  Type and screen [place]     Status: None   Collection Time: 08/21/16  3:36 AM  Result Value Ref Range   ABO/RH(D) A POS    Antibody Screen NEG    Sample Expiration 08/24/2016   ABO/Rh     Status: None (Preliminary result)   Collection Time: 08/21/16  3:36 AM  Result Value Ref Range   ABO/RH(D) A POS    Dg Chest 2 View  Result Date: 08/20/2016 CLINICAL DATA:  Left-sided chest pain EXAM: CHEST  2 VIEW COMPARISON:  Chest CT 08/05/2016, CXR 08/09/2016 FINDINGS: Obscuration of the left hemidiaphragm and portions of the left heart border secondary to presumed increase in left pleural effusion and compressive atelectasis. This spans 3-1/2 half vertebral body heights on the lateral view. Left fifth through eighth rib fractures are again noted with minimal displacement and overlying subcutaneous emphysema. No pneumothorax identified. No mediastinal shift. Right lung remains clear. Subacute to chronic appearing fractures on the right involving the eighth and ninth ribs. No aortic aneurysm. IMPRESSION: Acute minimally displaced known left fifth through eighth rib fractures. Subacute to chronic right eighth and ninth rib fractures. Interval increase in left moderate pleural effusion and compressive atelectasis. No appreciable pneumothorax identified. Subcutaneous emphysema overlying  the rib fractures. Electronically Signed   By: Ashley Royalty M.D.   On: 08/20/2016 21:50   Ct Angio Chest Pe W And/or Wo Contrast  Result Date: 08/21/2016 CLINICAL DATA:  Acute onset of left-sided chest pain and shortness of breath. Initial encounter. EXAM: CT ANGIOGRAPHY CHEST WITH CONTRAST TECHNIQUE: Multidetector CT imaging of the chest was performed using the standard protocol during bolus administration of intravenous contrast. Multiplanar CT image reconstructions and MIPs were obtained to evaluate the vascular anatomy. CONTRAST:  100 mL of Isovue 370 IV contrast COMPARISON:  Chest radiograph performed 08/20/2016, and CT of the chest performed 08/05/2016 FINDINGS: Cardiovascular: The heart is normal in size. The thoracic aorta is grossly unremarkable. Minimal calcification is seen along the proximal great vessels. Mediastinum/Nodes: A 1.5 cm azygoesophageal recess node is seen. No pericardial effusion is identified. The thyroid gland is grossly unremarkable. No axillary lymphadenopathy is appreciated. Lungs/Pleura: There has been significant interval increase in a moderate to large loculated left-sided pleural effusion. New areas of increased attenuation are noted within the effusion, suspicious for interval hemorrhage into the left hemithorax since the prior study. Underlying atelectasis is noted. Mild right basilar atelectasis is noted. Scattered peripheral blebs are noted at the lung apices. No pneumothorax is seen. No dominant mass is identified. Upper Abdomen: The visualized portions of the liver and spleen are grossly unremarkable. The visualized portions of the pancreas, adrenal glands and kidneys are within normal limits. Mild nonspecific perinephric stranding is noted bilaterally. Musculoskeletal: Fractures of the left lateral fifth through eighth ribs are again noted, with comminution at the left sixth and seventh ribs. Associated soft tissue hemorrhage is seen along the left chest wall, with  residual soft tissue air along the left chest wall. There are chronic compression deformities of vertebral bodies T12 and L1, with changes of vertebroplasty at L1. Review of the MIP images confirms the above findings. IMPRESSION: 1. Significant interval increase and moderate to large loculated right-sided pleural effusion. New areas of increased  attenuation within the effusion, suspicious for interval hemorrhage into the left hemithorax since the prior study. Underlying atelectasis noted. Infection cannot be entirely excluded, though there is no evidence of empyema at this time. 2. Fractures of the left lateral fifth through eighth ribs again noted, with comminution at the left sixth and seventh ribs. Associated soft tissue hemorrhage along the left chest wall, with residual soft tissue air along the left chest wall. 3. Mild right basilar atelectasis. Scattered peripheral blebs at the lung apices. 4. 1.5 cm azygoesophageal recess node noted. 5. Chronic compression deformities of vertebral bodies T12 and L1, with changes of vertebroplasty at L1. These results were called by telephone at the time of interpretation on 08/21/2016 at 2:38 am to Dr. Jola Schmidt, who verbally acknowledged these results. Electronically Signed   By: Garald Balding M.D.   On: 08/21/2016 02:40    Review of Systems  Constitutional: Negative for weight loss.  HENT: Negative for ear discharge, ear pain, hearing loss and tinnitus.   Eyes: Negative for blurred vision, double vision, photophobia and pain.  Respiratory: Positive for shortness of breath and wheezing. Negative for cough and sputum production.   Cardiovascular: Positive for chest pain.  Gastrointestinal: Negative for abdominal pain, nausea and vomiting.  Genitourinary: Negative for dysuria, flank pain, frequency and urgency.  Musculoskeletal: Negative for back pain, falls, joint pain, myalgias and neck pain.  Neurological: Negative for dizziness, tingling, sensory change,  focal weakness, loss of consciousness and headaches.  Endo/Heme/Allergies: Does not bruise/bleed easily.  Psychiatric/Behavioral: Negative for depression, memory loss and substance abuse. The patient is not nervous/anxious.     Blood pressure 121/79, pulse (!) 110, temperature 98.8 F (37.1 C), temperature source Oral, resp. rate 16, height 5' 9"  (1.753 m), weight 90.7 kg (200 lb), SpO2 97 %. Physical Exam  Constitutional: He is oriented to person, place, and time. He appears well-developed and well-nourished.  Eyes:  Pupils equal, round; sclera anicteric HENT:  Oral mucosa moist; good dentition  Neck:  No masses palpated, no thyromegaly Lungs:  Decreased left lateral breath sounds; decreased respiratory excursion; slight bilateral wheezes; tender to palpation left lateral chest CV:  Regular rate and rhythm; no murmurs; extremities well-perfused with no edema Abd:  +bowel sounds, soft, non-tender, no palpable organomegaly; no palpable hernias Skin:  Warm, dry; no sign of jaundice Psychiatric - alert and oriented x 4; calm mood and affect  Assessment/Plan 1.  Known left 5-8 lateral rib fractures 2.  Large left pleural effusion/ hemothorax 3.  Hyponatremia/ hypokalemia  Admit to Trauma service Will consult Thoracic Surgery - tube thoracostomy vs. VATS NPO for now Rehydrate - replete K  Shamon Cothran K. 08/21/2016, 5:31 AM   Procedures

## 2016-08-21 NOTE — ED Notes (Signed)
After morphine administration: Patient complained of feeling funny. This RN noted HR to have increased, patient now diaphoretic, with skin color pale. Blood pressure then noted to be @ 71/40. MD [facility] informed.

## 2016-08-21 NOTE — ED Notes (Signed)
Pt transported to CT ?

## 2016-08-21 NOTE — ED Notes (Signed)
Attempted to call report

## 2016-08-21 NOTE — Consult Note (Signed)
Reason for Consult:hemothorax/ rib fractures Referring Physician: Dr. Wynelle Link is an 61 y.o. male.  HPI: 61 yo man with a history of COPD and rib fractures admitted with acute increase in pain and shortness of breath.  Mr. Debruin is a 61 yo man with a history of tobacco abuse, COPD, anxiety, hypertension, hyperlipidemia and hypothyroidism. He was admitted last week after suffering a fall and multiple rib fractures. Discharged on 3/30. Yesterday noted abrupt onset of increased pleuritic CP and shortness of breath. Came to ED. CT to r/o PE showed a loculated left pleural effusion and multiple rib fractures.  Past Medical History:  Diagnosis Date  . Anxiety   . Arthritis    "neck; lower back; right hip; right knee" (10/18/2014)  . Asthma   . Colon polyps   . COPD (chronic obstructive pulmonary disease) ([place])   . High blood pressure   . High cholesterol   . Hilar adenopathy   . Hypothyroidism     Past Surgical History:  Procedure Laterality Date  . BACK SURGERY    . epidural injections    . FIXATION KYPHOPLASTY LUMBAR SPINE     "L1"  . KNEE ARTHROSCOPY Right 05/14/2010  . WISDOM TOOTH EXTRACTION      Family History  Problem Relation Age of Onset  . Emphysema Father   . Lung cancer Father   . Heart disease Brother   . Heart failure Mother     Social History:  reports that he has been smoking Cigarettes.  He started smoking about 43 years ago. He has a 70.00 pack-year smoking history. He has never used smokeless tobacco. He reports that he drinks about 25.2 oz of alcohol per week . He reports that he does not use drugs.  Allergies: No Known Allergies  Medications:  Scheduled: . albuterol  2.5 mg Nebulization QID  . [START ON 08/22/2016] enoxaparin (LOVENOX) injection  40 mg Subcutaneous Q24H  . irbesartan  300 mg Oral Daily   And  . hydrochlorothiazide  25 mg Oral Daily  . iopamidol      . levothyroxine  50 mcg Oral QAC breakfast  . mometasone-formoterol  2 puff  Inhalation BID  . NIFEdipine  90 mg Oral Daily    Results for orders placed or performed during the hospital encounter of 08/20/16 (from the past 48 hour(s))  Basic metabolic panel     Status: Abnormal   Collection Time: 08/20/16  8:34 PM  Result Value Ref Range   Sodium 127 (L) 135 - 145 mmol/L   Potassium 3.3 (L) 3.5 - 5.1 mmol/L   Chloride 90 (L) 101 - 111 mmol/L   CO2 23 22 - 32 mmol/L   Glucose, Bld 127 (H) 65 - 99 mg/dL   BUN 8 6 - 20 mg/dL   Creatinine, Ser 0.91 0.61 - 1.24 mg/dL   Calcium 8.5 (L) 8.9 - 10.3 mg/dL   GFR calc non Af Amer >60 >60 mL/min   GFR calc Af Amer >60 >60 mL/min    Comment: (NOTE) The eGFR has been calculated using the CKD EPI equation. This calculation has not been validated in all clinical situations. eGFR's persistently <60 mL/min signify possible Chronic Kidney Disease.    Anion gap 14 5 - 15  CBC     Status: Abnormal   Collection Time: 08/20/16  8:34 PM  Result Value Ref Range   WBC 12.4 (H) 4.0 - 10.5 K/uL   RBC 4.15 (L) 4.22 - 5.81 MIL/uL  Hemoglobin 14.2 13.0 - 17.0 g/dL   HCT 40.2 39.0 - 52.0 %   MCV 96.9 78.0 - 100.0 fL   MCH 34.2 (H) 26.0 - 34.0 pg   MCHC 35.3 30.0 - 36.0 g/dL   RDW 13.0 11.5 - 15.5 %   Platelets 390 150 - 400 K/uL  I-stat troponin, ED     Status: None   Collection Time: 08/20/16  8:49 PM  Result Value Ref Range   Troponin i, poc 0.00 0.00 - 0.08 ng/mL   Comment 3            Comment: Due to the release kinetics of cTnI, a negative result within the first hours of the onset of symptoms does not rule out myocardial infarction with certainty. If myocardial infarction is still suspected, repeat the test at appropriate intervals.   Hemoglobin and hematocrit, blood     Status: Abnormal   Collection Time: 08/21/16  3:36 AM  Result Value Ref Range   Hemoglobin 12.2 (L) 13.0 - 17.0 g/dL   HCT 34.6 (L) 39.0 - 52.0 %  Type and screen [place]     Status: None   Collection Time: 08/21/16  3:36 AM   Result Value Ref Range   ABO/RH(D) A POS    Antibody Screen NEG    Sample Expiration 08/24/2016   ABO/Rh     Status: None (Preliminary result)   Collection Time: 08/21/16  3:36 AM  Result Value Ref Range   ABO/RH(D) A POS     Dg Chest 2 View  Result Date: 08/20/2016 CLINICAL DATA:  Left-sided chest pain EXAM: CHEST  2 VIEW COMPARISON:  Chest CT 08/05/2016, CXR 08/09/2016 FINDINGS: Obscuration of the left hemidiaphragm and portions of the left heart border secondary to presumed increase in left pleural effusion and compressive atelectasis. This spans 3-1/2 half vertebral body heights on the lateral view. Left fifth through eighth rib fractures are again noted with minimal displacement and overlying subcutaneous emphysema. No pneumothorax identified. No mediastinal shift. Right lung remains clear. Subacute to chronic appearing fractures on the right involving the eighth and ninth ribs. No aortic aneurysm. IMPRESSION: Acute minimally displaced known left fifth through eighth rib fractures. Subacute to chronic right eighth and ninth rib fractures. Interval increase in left moderate pleural effusion and compressive atelectasis. No appreciable pneumothorax identified. Subcutaneous emphysema overlying the rib fractures. Electronically Signed   By: Ashley Royalty M.D.   On: 08/20/2016 21:50   Ct Angio Chest Pe W And/or Wo Contrast  Result Date: 08/21/2016 CLINICAL DATA:  Acute onset of left-sided chest pain and shortness of breath. Initial encounter. EXAM: CT ANGIOGRAPHY CHEST WITH CONTRAST TECHNIQUE: Multidetector CT imaging of the chest was performed using the standard protocol during bolus administration of intravenous contrast. Multiplanar CT image reconstructions and MIPs were obtained to evaluate the vascular anatomy. CONTRAST:  100 mL of Isovue 370 IV contrast COMPARISON:  Chest radiograph performed 08/20/2016, and CT of the chest performed 08/05/2016 FINDINGS: Cardiovascular: The heart is normal in  size. The thoracic aorta is grossly unremarkable. Minimal calcification is seen along the proximal great vessels. Mediastinum/Nodes: A 1.5 cm azygoesophageal recess node is seen. No pericardial effusion is identified. The thyroid gland is grossly unremarkable. No axillary lymphadenopathy is appreciated. Lungs/Pleura: There has been significant interval increase in a moderate to large loculated left-sided pleural effusion. New areas of increased attenuation are noted within the effusion, suspicious for interval hemorrhage into the left hemithorax since the prior study. Underlying atelectasis  is noted. Mild right basilar atelectasis is noted. Scattered peripheral blebs are noted at the lung apices. No pneumothorax is seen. No dominant mass is identified. Upper Abdomen: The visualized portions of the liver and spleen are grossly unremarkable. The visualized portions of the pancreas, adrenal glands and kidneys are within normal limits. Mild nonspecific perinephric stranding is noted bilaterally. Musculoskeletal: Fractures of the left lateral fifth through eighth ribs are again noted, with comminution at the left sixth and seventh ribs. Associated soft tissue hemorrhage is seen along the left chest wall, with residual soft tissue air along the left chest wall. There are chronic compression deformities of vertebral bodies T12 and L1, with changes of vertebroplasty at L1. Review of the MIP images confirms the above findings. IMPRESSION: 1. Significant interval increase and moderate to large loculated right-sided pleural effusion. New areas of increased attenuation within the effusion, suspicious for interval hemorrhage into the left hemithorax since the prior study. Underlying atelectasis noted. Infection cannot be entirely excluded, though there is no evidence of empyema at this time. 2. Fractures of the left lateral fifth through eighth ribs again noted, with comminution at the left sixth and seventh ribs. Associated  soft tissue hemorrhage along the left chest wall, with residual soft tissue air along the left chest wall. 3. Mild right basilar atelectasis. Scattered peripheral blebs at the lung apices. 4. 1.5 cm azygoesophageal recess node noted. 5. Chronic compression deformities of vertebral bodies T12 and L1, with changes of vertebroplasty at L1. These results were called by telephone at the time of interpretation on 08/21/2016 at 2:38 am to Dr. KEVIN CAMPOS, who verbally acknowledged these results. Electronically Signed   By: Jeffery  Chang M.D.   On: 08/21/2016 02:40    Review of Systems  Constitutional: Negative for chills and fever.  Respiratory: Positive for shortness of breath and wheezing.   Cardiovascular: Positive for chest pain.  Gastrointestinal: Negative for nausea and vomiting.  Musculoskeletal:       Chest wall pain  Psychiatric/Behavioral: The patient is nervous/anxious.   All other systems reviewed and are negative.  Blood pressure (!) 155/68, pulse 83, temperature 98.8 F (37.1 C), temperature source Oral, resp. rate 16, height 5' 9" (1.753 m), weight 200 lb (90.7 kg), SpO2 95 %. Physical Exam  Vitals reviewed. Constitutional: He is oriented to person, place, and time. He appears well-developed. He appears distressed (mildly).  HENT:  Head: Normocephalic and atraumatic.  Mouth/Throat: No oropharyngeal exudate.  Eyes: Conjunctivae and EOM are normal.  Neck: No thyromegaly present.  Cardiovascular: Normal rate, regular rhythm and normal heart sounds.   No murmur heard. Respiratory: No respiratory distress. He has wheezes (diffuse, severe).  Left flank ecchymoses  GI: Soft. He exhibits no distension. There is no tenderness.  Musculoskeletal: Normal range of motion. He exhibits no edema.  Lymphadenopathy:    He has no cervical adenopathy.  Neurological: He is alert and oriented to person, place, and time. No cranial nerve deficit.  No focal motor deficit  Skin: Skin is warm and  dry.    Assessment/Plan: 60 yo man with recent fall resulting in multiple rib fractures. He now presents with increased pain and shortness of breath and has a loculated pleural effusion. He also is wheezing severely and needs immediate attention to that.  COPD- will order nebs and ask Pulmonary to see  Rib fractures/ Hemothorax- hemothorax is loculated and still having pain from rib fractures. I think he would benefit from a VATS/ thoracotomy to drain the effusion   and also would likely benefit from rib plating to stabilize the fractures.  I discussed the general nature of the procedure, the need for general anesthesia, and the incisions to be used with Mr and Mrs Bruun. We discussed the expected hospital stay, overall recovery and short and long term outcomes. I informed them of the indications, risks, benefits and alternatives. They understand the risks include, but are not limited to death, stroke, MI, DVT/PE, bleeding, possible need for transfusion, infections, respiratory failure, as well as other organ system dysfunction including renal or GI complications.   He accept the risks and agrees to proceed.  Will plan surgery tomorrow if wheezing improves with medical therapy  [name] Nakayama 08/21/2016, 9:00 AM

## 2016-08-22 ENCOUNTER — Inpatient Hospital Stay (HOSPITAL_COMMUNITY): Payer: 59 | Admitting: Certified Registered Nurse Anesthetist

## 2016-08-22 ENCOUNTER — Encounter (HOSPITAL_COMMUNITY): Payer: Self-pay | Admitting: Certified Registered Nurse Anesthetist

## 2016-08-22 ENCOUNTER — Inpatient Hospital Stay (HOSPITAL_COMMUNITY): Payer: 59

## 2016-08-22 ENCOUNTER — Encounter (HOSPITAL_COMMUNITY): Admission: EM | Disposition: A | Discharge status: Home / Self Care | Payer: Self-pay | Source: Home / Self Care

## 2016-08-22 HISTORY — PX: PLEURAL EFFUSION DRAINAGE: SHX5099

## 2016-08-22 HISTORY — PX: VIDEO ASSISTED THORACOSCOPY: SHX5073

## 2016-08-22 HISTORY — PX: RIB PLATING: SHX5079

## 2016-08-22 LAB — BLOOD GAS, ARTERIAL
Acid-base deficit: 1.1 mmol/L (ref 0.0–2.0)
Bicarbonate: 23.6 mmol/L (ref 20.0–28.0)
Drawn by: 447081
O2 Content: 4 L/min
O2 Saturation: 92 %
PATIENT TEMPERATURE: 97.8
PCO2 ART: 42 mmHg (ref 32.0–48.0)
PO2 ART: 66.8 mmHg — AB (ref 83.0–108.0)
pH, Arterial: 7.366 (ref 7.350–7.450)

## 2016-08-22 LAB — POCT I-STAT EG7
ACID-BASE EXCESS: 1 mmol/L (ref 0.0–2.0)
BICARBONATE: 26.1 mmol/L (ref 20.0–28.0)
Calcium, Ion: 1.15 mmol/L (ref 1.15–1.40)
HCT: 31 % — ABNORMAL LOW (ref 39.0–52.0)
Hemoglobin: 10.5 g/dL — ABNORMAL LOW (ref 13.0–17.0)
O2 SAT: 77 %
PCO2 VEN: 43.4 mmHg — AB (ref 44.0–60.0)
PH VEN: 7.384 (ref 7.250–7.430)
PO2 VEN: 40 mmHg (ref 32.0–45.0)
Patient temperature: 36
Potassium: 4.5 mmol/L (ref 3.5–5.1)
Sodium: 130 mmol/L — ABNORMAL LOW (ref 135–145)
TCO2: 28 mmol/L (ref 0–100)

## 2016-08-22 SURGERY — VIDEO ASSISTED THORACOSCOPY
Anesthesia: General | Site: Chest | Laterality: Left

## 2016-08-22 MED ORDER — PANTOPRAZOLE SODIUM 40 MG PO TBEC
40.0000 mg | DELAYED_RELEASE_TABLET | Freq: Every day | ORAL | Status: DC
  Administered 2016-08-22 – 2016-08-30 (×9): 40 mg via ORAL
  Filled 2016-08-22 (×9): qty 1

## 2016-08-22 MED ORDER — DIPHENHYDRAMINE HCL 12.5 MG/5ML PO ELIX: 12.5000 mg | ORAL_SOLUTION | Freq: Four times a day (QID) | ORAL | Status: DC | PRN

## 2016-08-22 MED ORDER — VECURONIUM BROMIDE 10 MG IV SOLR
INTRAVENOUS | Status: AC
  Filled 2016-08-22: qty 10

## 2016-08-22 MED ORDER — OXYCODONE HCL 5 MG PO TABS
5.0000 mg | ORAL_TABLET | ORAL | Status: DC | PRN
  Administered 2016-08-22 – 2016-08-23 (×2): 5 mg via ORAL
  Administered 2016-08-25: 10 mg via ORAL
  Filled 2016-08-22: qty 1
  Filled 2016-08-22: qty 2
  Filled 2016-08-22: qty 1

## 2016-08-22 MED ORDER — ROCURONIUM BROMIDE 10 MG/ML (PF) SYRINGE
PREFILLED_SYRINGE | INTRAVENOUS | Status: DC | PRN
  Administered 2016-08-22: 50 mg via INTRAVENOUS

## 2016-08-22 MED ORDER — ONDANSETRON HCL 4 MG/2ML IJ SOLN
INTRAMUSCULAR | Status: AC
  Filled 2016-08-22: qty 2

## 2016-08-22 MED ORDER — PROPOFOL 10 MG/ML IV BOLUS
INTRAVENOUS | Status: DC | PRN
  Administered 2016-08-22: 140 mg via INTRAVENOUS

## 2016-08-22 MED ORDER — VECURONIUM BROMIDE 10 MG IV SOLR
INTRAVENOUS | Status: DC | PRN
  Administered 2016-08-22 (×4): 2 mg via INTRAVENOUS
  Administered 2016-08-22: 3 mg via INTRAVENOUS

## 2016-08-22 MED ORDER — TRAMADOL HCL 50 MG PO TABS
50.0000 mg | ORAL_TABLET | Freq: Four times a day (QID) | ORAL | Status: DC | PRN
  Administered 2016-08-28: 50 mg via ORAL
  Filled 2016-08-22: qty 1

## 2016-08-22 MED ORDER — LIDOCAINE 2% (20 MG/ML) 5 ML SYRINGE
INTRAMUSCULAR | Status: AC
  Filled 2016-08-22: qty 5

## 2016-08-22 MED ORDER — FENTANYL CITRATE (PF) 250 MCG/5ML IJ SOLN
INTRAMUSCULAR | Status: AC
  Filled 2016-08-22: qty 5

## 2016-08-22 MED ORDER — PROPOFOL 10 MG/ML IV BOLUS
INTRAVENOUS | Status: AC
  Filled 2016-08-22: qty 20

## 2016-08-22 MED ORDER — SENNOSIDES-DOCUSATE SODIUM 8.6-50 MG PO TABS
1.0000 | ORAL_TABLET | Freq: Every day | ORAL | Status: DC
  Administered 2016-08-22 – 2016-08-29 (×8): 1 via ORAL
  Filled 2016-08-22 (×8): qty 1

## 2016-08-22 MED ORDER — ALBUTEROL SULFATE HFA 108 (90 BASE) MCG/ACT IN AERS
INHALATION_SPRAY | RESPIRATORY_TRACT | Status: DC | PRN
  Administered 2016-08-22: 10 via RESPIRATORY_TRACT

## 2016-08-22 MED ORDER — ENOXAPARIN SODIUM 40 MG/0.4ML ~~LOC~~ SOLN
40.0000 mg | SUBCUTANEOUS | Status: DC
  Administered 2016-08-23 – 2016-08-29 (×7): 40 mg via SUBCUTANEOUS
  Filled 2016-08-22 (×7): qty 0.4

## 2016-08-22 MED ORDER — SODIUM CHLORIDE 0.9 % IV SOLN
30.0000 meq | Freq: Every day | INTRAVENOUS | Status: DC | PRN
  Filled 2016-08-22: qty 15

## 2016-08-22 MED ORDER — HYDROMORPHONE HCL 1 MG/ML IJ SOLN: 0.2500 mg | INTRAMUSCULAR | Status: DC | PRN

## 2016-08-22 MED ORDER — LACTATED RINGERS IV SOLN
INTRAVENOUS | Status: DC | PRN
  Administered 2016-08-22: 14:00:00 via INTRAVENOUS

## 2016-08-22 MED ORDER — SUGAMMADEX SODIUM 200 MG/2ML IV SOLN
INTRAVENOUS | Status: DC | PRN
  Administered 2016-08-22: 200 mg via INTRAVENOUS

## 2016-08-22 MED ORDER — ALBUTEROL SULFATE (2.5 MG/3ML) 0.083% IN NEBU
2.5000 mg | INHALATION_SOLUTION | Freq: Three times a day (TID) | RESPIRATORY_TRACT | Status: DC
Start: 2016-08-22 — End: 2016-08-23
  Administered 2016-08-22: 2.5 mg via RESPIRATORY_TRACT
  Filled 2016-08-22 (×2): qty 3

## 2016-08-22 MED ORDER — MIDAZOLAM HCL 2 MG/2ML IJ SOLN
2.0000 mg | Freq: Once | INTRAMUSCULAR | Status: AC
  Administered 2016-08-22: 2 mg via INTRAVENOUS

## 2016-08-22 MED ORDER — MIDAZOLAM HCL 2 MG/2ML IJ SOLN
INTRAMUSCULAR | Status: AC
  Administered 2016-08-22: 2 mg via INTRAVENOUS
  Filled 2016-08-22: qty 2

## 2016-08-22 MED ORDER — ORAL CARE MOUTH RINSE
15.0000 mL | Freq: Two times a day (BID) | OROMUCOSAL | Status: DC
  Administered 2016-08-22 – 2016-08-27 (×9): 15 mL via OROMUCOSAL

## 2016-08-22 MED ORDER — FENTANYL CITRATE (PF) 100 MCG/2ML IJ SOLN
INTRAMUSCULAR | Status: DC | PRN
  Administered 2016-08-22 (×4): 50 ug via INTRAVENOUS
  Administered 2016-08-22: 100 ug via INTRAVENOUS
  Administered 2016-08-22: 50 ug via INTRAVENOUS
  Administered 2016-08-22: 100 ug via INTRAVENOUS
  Administered 2016-08-22: 50 ug via INTRAVENOUS

## 2016-08-22 MED ORDER — DEXTROSE-NACL 5-0.9 % IV SOLN
INTRAVENOUS | Status: DC
  Administered 2016-08-22 – 2016-08-24 (×4): via INTRAVENOUS

## 2016-08-22 MED ORDER — GLYCOPYRROLATE 0.2 MG/ML IJ SOLN
INTRAMUSCULAR | Status: DC | PRN
  Administered 2016-08-22: 0.6 mg via INTRAVENOUS

## 2016-08-22 MED ORDER — DEXTROSE 5 % IV SOLN
1.5000 g | Freq: Two times a day (BID) | INTRAVENOUS | Status: AC
  Administered 2016-08-23 (×2): 1.5 g via INTRAVENOUS
  Filled 2016-08-22 (×2): qty 1.5

## 2016-08-22 MED ORDER — NEOSTIGMINE METHYLSULFATE 5 MG/5ML IV SOSY
PREFILLED_SYRINGE | INTRAVENOUS | Status: AC
  Filled 2016-08-22: qty 5

## 2016-08-22 MED ORDER — ONDANSETRON HCL 4 MG/2ML IJ SOLN: 4.0000 mg | Freq: Four times a day (QID) | INTRAMUSCULAR | Status: DC | PRN

## 2016-08-22 MED ORDER — SODIUM CHLORIDE 0.9% FLUSH: 9.0000 mL | INTRAVENOUS | Status: DC | PRN

## 2016-08-22 MED ORDER — ACETAMINOPHEN 160 MG/5ML PO SOLN
1000.0000 mg | Freq: Four times a day (QID) | ORAL | Status: AC
  Administered 2016-08-24 (×2): 1000 mg via ORAL
  Filled 2016-08-22 (×2): qty 40.6

## 2016-08-22 MED ORDER — NALOXONE HCL 0.4 MG/ML IJ SOLN: 0.4000 mg | INTRAMUSCULAR | Status: DC | PRN

## 2016-08-22 MED ORDER — MIDAZOLAM HCL 2 MG/2ML IJ SOLN
INTRAMUSCULAR | Status: AC
  Filled 2016-08-22: qty 2

## 2016-08-22 MED ORDER — DIPHENHYDRAMINE HCL 50 MG/ML IJ SOLN: 12.5000 mg | Freq: Four times a day (QID) | INTRAMUSCULAR | Status: DC | PRN

## 2016-08-22 MED ORDER — LACTATED RINGERS IV SOLN
INTRAVENOUS | Status: DC | PRN
  Administered 2016-08-22 (×2): via INTRAVENOUS

## 2016-08-22 MED ORDER — LACTATED RINGERS IV SOLN
Freq: Once | INTRAVENOUS | Status: AC
  Administered 2016-08-22: 14:00:00 via INTRAVENOUS

## 2016-08-22 MED ORDER — SUCCINYLCHOLINE CHLORIDE 200 MG/10ML IV SOSY
PREFILLED_SYRINGE | INTRAVENOUS | Status: DC | PRN
  Administered 2016-08-22: 120 mg via INTRAVENOUS

## 2016-08-22 MED ORDER — FENTANYL CITRATE (PF) 100 MCG/2ML IJ SOLN
100.0000 ug | Freq: Once | INTRAMUSCULAR | Status: AC
  Administered 2016-08-22: 100 ug via INTRAVENOUS

## 2016-08-22 MED ORDER — KETOROLAC TROMETHAMINE 30 MG/ML IJ SOLN
30.0000 mg | Freq: Four times a day (QID) | INTRAMUSCULAR | Status: AC
  Administered 2016-08-22 – 2016-08-23 (×4): 30 mg via INTRAVENOUS
  Filled 2016-08-22 (×4): qty 1

## 2016-08-22 MED ORDER — NEOSTIGMINE METHYLSULFATE 10 MG/10ML IV SOLN
INTRAVENOUS | Status: DC | PRN
  Administered 2016-08-22: 4 mg via INTRAVENOUS

## 2016-08-22 MED ORDER — PHENYLEPHRINE 40 MCG/ML (10ML) SYRINGE FOR IV PUSH (FOR BLOOD PRESSURE SUPPORT)
PREFILLED_SYRINGE | INTRAVENOUS | Status: AC
  Filled 2016-08-22: qty 10

## 2016-08-22 MED ORDER — 0.9 % SODIUM CHLORIDE (POUR BTL) OPTIME
TOPICAL | Status: DC | PRN
  Administered 2016-08-22: 2000 mL

## 2016-08-22 MED ORDER — LIDOCAINE 2% (20 MG/ML) 5 ML SYRINGE
INTRAMUSCULAR | Status: DC | PRN
  Administered 2016-08-22: 60 mg via INTRAVENOUS

## 2016-08-22 MED ORDER — PHENYLEPHRINE HCL 10 MG/ML IJ SOLN
INTRAMUSCULAR | Status: DC | PRN
  Administered 2016-08-22: 20 ug/min via INTRAVENOUS

## 2016-08-22 MED ORDER — BISACODYL 5 MG PO TBEC
10.0000 mg | DELAYED_RELEASE_TABLET | Freq: Every day | ORAL | Status: DC
  Administered 2016-08-22 – 2016-08-29 (×8): 10 mg via ORAL
  Filled 2016-08-22 (×8): qty 2

## 2016-08-22 MED ORDER — ROCURONIUM BROMIDE 50 MG/5ML IV SOSY
PREFILLED_SYRINGE | INTRAVENOUS | Status: AC
  Filled 2016-08-22: qty 5

## 2016-08-22 MED ORDER — ACETAMINOPHEN 500 MG PO TABS
1000.0000 mg | ORAL_TABLET | Freq: Four times a day (QID) | ORAL | Status: AC
  Administered 2016-08-22 – 2016-08-27 (×12): 1000 mg via ORAL
  Filled 2016-08-22 (×14): qty 2

## 2016-08-22 MED ORDER — FENTANYL 40 MCG/ML IV SOLN
INTRAVENOUS | Status: DC
  Administered 2016-08-22: 1000 ug via INTRAVENOUS
  Administered 2016-08-23: 30 ug via INTRAVENOUS
  Administered 2016-08-23: 270 ug via INTRAVENOUS
  Administered 2016-08-23: 90 ug via INTRAVENOUS
  Administered 2016-08-23: 165 ug via INTRAVENOUS
  Administered 2016-08-23: 90 ug via INTRAVENOUS
  Administered 2016-08-23: 143 ug via INTRAVENOUS
  Administered 2016-08-24: 30 ug via INTRAVENOUS
  Administered 2016-08-24 (×2): 75 ug via INTRAVENOUS
  Administered 2016-08-24: 1000 ug via INTRAVENOUS
  Administered 2016-08-25: 30 ug via INTRAVENOUS
  Administered 2016-08-25: 15 ug via INTRAVENOUS
  Administered 2016-08-25: 45 ug via INTRAVENOUS
  Administered 2016-08-25: 90 ug via INTRAVENOUS
  Administered 2016-08-26 (×2): 15 ug via INTRAVENOUS
  Administered 2016-08-26: 60 ug via INTRAVENOUS
  Administered 2016-08-26: 15 ug via INTRAVENOUS
  Administered 2016-08-26: 60 ug via INTRAVENOUS
  Administered 2016-08-26: 15 ug via INTRAVENOUS
  Administered 2016-08-27: 60 ug via INTRAVENOUS
  Filled 2016-08-22 (×2): qty 25

## 2016-08-22 MED ORDER — ONDANSETRON HCL 4 MG/2ML IJ SOLN
INTRAMUSCULAR | Status: DC | PRN
  Administered 2016-08-22: 4 mg via INTRAVENOUS

## 2016-08-22 SURGICAL SUPPLY — 110 items
APPLIER CLIP ROT 10 11.4 M/L (STAPLE)
BATTERY MAXDRIVER (MISCELLANEOUS) ×6 IMPLANT
BATTERY PACK STR FOR DRIVER (MISCELLANEOUS) IMPLANT
BENZOIN TINCTURE PRP APPL 2/3 (GAUZE/BANDAGES/DRESSINGS) IMPLANT
CANISTER SUCT 3000ML PPV (MISCELLANEOUS) ×3 IMPLANT
CATH HYDRAGLIDE XL THORACIC (CATHETERS) IMPLANT
CATH KIT ON Q 5IN SLV (PAIN MANAGEMENT) IMPLANT
CATH THORACIC 28FR (CATHETERS) ×3 IMPLANT
CATH THORACIC 28FR RT ANG (CATHETERS) IMPLANT
CATH THORACIC 36FR (CATHETERS) IMPLANT
CATH THORACIC 36FR RT ANG (CATHETERS) IMPLANT
CLIP APPLIE ROT 10 11.4 M/L (STAPLE) IMPLANT
CLIP TI MEDIUM 6 (CLIP) ×3 IMPLANT
CONN Y 3/8X3/8X3/8  BEN (MISCELLANEOUS) ×1
CONN Y 3/8X3/8X3/8 BEN (MISCELLANEOUS) ×2 IMPLANT
CONT SPEC 4OZ CLIKSEAL STRL BL (MISCELLANEOUS) ×9 IMPLANT
COVER SURGICAL LIGHT HANDLE (MISCELLANEOUS) ×6 IMPLANT
CUTTER ECHEON FLEX ENDO 45 340 (ENDOMECHANICALS) ×3 IMPLANT
DECANTER SPIKE VIAL GLASS SM (MISCELLANEOUS) ×3 IMPLANT
DERMABOND ADVANCED (GAUZE/BANDAGES/DRESSINGS) ×1
DERMABOND ADVANCED .7 DNX12 (GAUZE/BANDAGES/DRESSINGS) ×2 IMPLANT
DEVICE THORACIC LVL1 TEMP FIX (Screw) ×6 IMPLANT
DRAIN CHANNEL 28F RND 3/8 FF (WOUND CARE) ×3 IMPLANT
DRAIN CHANNEL 32F RND 10.7 FF (WOUND CARE) ×3 IMPLANT
DRAPE LAPAROSCOPIC ABDOMINAL (DRAPES) ×3 IMPLANT
DRAPE SLUSH/WARMER DISC (DRAPES) ×3 IMPLANT
DRAPE WARM FLUID 44X44 (DRAPE) ×3 IMPLANT
ELECT BLADE 4.0 EZ CLEAN MEGAD (MISCELLANEOUS) ×3
ELECT BLADE 6.5 EXT (BLADE) ×3 IMPLANT
ELECT REM PT RETURN 9FT ADLT (ELECTROSURGICAL) ×3
ELECTRODE BLDE 4.0 EZ CLN MEGD (MISCELLANEOUS) ×2 IMPLANT
ELECTRODE REM PT RTRN 9FT ADLT (ELECTROSURGICAL) ×2 IMPLANT
GAUZE SPONGE 4X4 12PLY STRL (GAUZE/BANDAGES/DRESSINGS) ×3 IMPLANT
GLOVE BIO SURGEON STRL SZ7.5 (GLOVE) ×3 IMPLANT
GLOVE BIOGEL M 6.5 STRL (GLOVE) ×3 IMPLANT
GLOVE BIOGEL PI IND STRL 6 (GLOVE) ×2 IMPLANT
GLOVE BIOGEL PI IND STRL 6.5 (GLOVE) ×2 IMPLANT
GLOVE BIOGEL PI INDICATOR 6 (GLOVE) ×1
GLOVE BIOGEL PI INDICATOR 6.5 (GLOVE) ×1
GLOVE SURG SIGNA 7.5 PF LTX (GLOVE) ×6 IMPLANT
GOWN BRE IMP PREV XXLGXLNG (GOWN DISPOSABLE) ×3 IMPLANT
GOWN STRL REUS W/ TWL LRG LVL3 (GOWN DISPOSABLE) ×8 IMPLANT
GOWN STRL REUS W/ TWL XL LVL3 (GOWN DISPOSABLE) ×2 IMPLANT
GOWN STRL REUS W/TWL LRG LVL3 (GOWN DISPOSABLE) ×4
GOWN STRL REUS W/TWL XL LVL3 (GOWN DISPOSABLE) ×1
HEMOSTAT POWDER SURGIFOAM 1G (HEMOSTASIS) IMPLANT
HEMOSTAT SURGICEL 2X14 (HEMOSTASIS) IMPLANT
IV CATH 22GX1 FEP (IV SOLUTION) IMPLANT
KIT BASIN OR (CUSTOM PROCEDURE TRAY) ×3 IMPLANT
KIT ROOM TURNOVER OR (KITS) ×3 IMPLANT
KIT SUCTION CATH 14FR (SUCTIONS) ×3 IMPLANT
NS IRRIG 1000ML POUR BTL (IV SOLUTION) ×6 IMPLANT
PACK CHEST (CUSTOM PROCEDURE TRAY) ×3 IMPLANT
PAD ARMBOARD 7.5X6 YLW CONV (MISCELLANEOUS) ×6 IMPLANT
PLATE RIB X SHAPE 20HOLE (Plate) ×6 IMPLANT
POUCH ENDO CATCH II 15MM (MISCELLANEOUS) IMPLANT
POUCH SPECIMEN RETRIEVAL 10MM (ENDOMECHANICALS) IMPLANT
SAW GIGLI STERILE 20 (MISCELLANEOUS) IMPLANT
SCREW RIB MAXDRIVE 2.3X7 STRL (Screw) ×6 IMPLANT
SCREW RIB MAXDRIVE 2.3X7MM (Screw) ×3 IMPLANT
SEALANT PROGEL (MISCELLANEOUS) IMPLANT
SEALANT SURG COSEAL 4ML (VASCULAR PRODUCTS) IMPLANT
SEALANT SURG COSEAL 8ML (VASCULAR PRODUCTS) IMPLANT
SOLUTION ANTI FOG 6CC (MISCELLANEOUS) ×3 IMPLANT
SPECIMEN JAR MEDIUM (MISCELLANEOUS) ×3 IMPLANT
SPONGE INTESTINAL PEANUT (DISPOSABLE) ×18 IMPLANT
SPONGE LAP 18X18 X RAY DECT (DISPOSABLE) ×3 IMPLANT
SPONGE TONSIL 1 RF SGL (DISPOSABLE) ×3 IMPLANT
STAPLE RELOAD 45MM GOLD (STAPLE) ×18 IMPLANT
SUT FIBERWIRE #2 38 T-5 BLUE (SUTURE)
SUT FIBERWIRE #5 38 CONV NDL (SUTURE)
SUT PROLENE 4 0 RB 1 (SUTURE)
SUT PROLENE 4-0 RB1 .5 CRCL 36 (SUTURE) IMPLANT
SUT SILK  1 MH (SUTURE) ×1
SUT SILK 0 FSL (SUTURE) IMPLANT
SUT SILK 1 MH (SUTURE) ×2 IMPLANT
SUT SILK 2 0 SH (SUTURE) IMPLANT
SUT SILK 2 0SH CR/8 30 (SUTURE) IMPLANT
SUT SILK 3 0SH CR/8 30 (SUTURE) IMPLANT
SUT VIC AB 1 CTX 18 (SUTURE) ×3 IMPLANT
SUT VIC AB 1 CTX 27 (SUTURE) ×9 IMPLANT
SUT VIC AB 1 CTX 36 (SUTURE)
SUT VIC AB 1 CTX36XBRD ANBCTR (SUTURE) IMPLANT
SUT VIC AB 2-0 CTX 27 (SUTURE) IMPLANT
SUT VIC AB 2-0 CTX 36 (SUTURE) ×9 IMPLANT
SUT VIC AB 2-0 UR6 27 (SUTURE) ×3 IMPLANT
SUT VIC AB 3-0 MH 27 (SUTURE) IMPLANT
SUT VIC AB 3-0 X1 27 (SUTURE) ×9 IMPLANT
SUT VICRYL 2 TP 1 (SUTURE) ×3 IMPLANT
SUTURE FIBERWR #2 38 T-5 BLUE (SUTURE) IMPLANT
SUTURE FIBERWR #5 38 CONV NDL (SUTURE) IMPLANT
SWAB COLLECTION DEVICE MRSA (MISCELLANEOUS) IMPLANT
SWAB CULTURE ESWAB REG 1ML (MISCELLANEOUS) IMPLANT
SYR 20CC LL (SYRINGE) IMPLANT
SYSTEM SAHARA CHEST DRAIN ATS (WOUND CARE) ×3 IMPLANT
SYSTEM SAHARA CHEST DRAIN RE-I (WOUND CARE) ×3 IMPLANT
TAPE CLOTH 4X10 WHT NS (GAUZE/BANDAGES/DRESSINGS) IMPLANT
TAPE CLOTH SURG 4X10 WHT LF (GAUZE/BANDAGES/DRESSINGS) ×3 IMPLANT
TEMPLATE RIB THORACIC STR LVL1 ×3 IMPLANT
TIP APPLICATOR SPRAY EXTEND 16 (VASCULAR PRODUCTS) IMPLANT
TOWEL OR 17X24 6PK STRL BLUE (TOWEL DISPOSABLE) ×6 IMPLANT
TOWEL OR 17X26 10 PK STRL BLUE (TOWEL DISPOSABLE) ×6 IMPLANT
TRAP SPECIMEN MUCOUS 40CC (MISCELLANEOUS) ×3 IMPLANT
TRAY FOLEY W/METER SILVER 16FR (SET/KITS/TRAYS/PACK) ×3 IMPLANT
TROCAR BLADELESS 5MM (ENDOMECHANICALS) ×3 IMPLANT
TROCAR XCEL BLADELESS 5X75MML (TROCAR) ×3 IMPLANT
TROCAR XCEL NON-BLD 5MMX100MML (ENDOMECHANICALS) IMPLANT
TUNNELER SHEATH ON-Q 11GX8 DSP (PAIN MANAGEMENT) IMPLANT
TUNNELER SHEATH ON-Q 16GX12 DP (PAIN MANAGEMENT) IMPLANT
WATER STERILE IRR 1000ML POUR (IV SOLUTION) ×3 IMPLANT

## 2016-08-22 NOTE — Progress Notes (Signed)
Patient went to OR, VSS, IV intact before he left, O2 on 3L. Report called to short stay. Family at bedside and aware of transfer to OR. Pre-procedure checklist complete.

## 2016-08-22 NOTE — Transfer of Care (Signed)
Immediate Anesthesia Transfer of Care Note  Patient: Frank Rosales  Procedure(s) Performed: Procedure(s): VIDEO ASSISTED THORACOSCOPY (Left) DRAINAGE OF HEMOTHORAX (Left) RIB PLATING (Left)  Patient Location: PACU  Anesthesia Type:General  Level of Consciousness: awake, sedated and patient cooperative  Airway & Oxygen Therapy: Patient connected to face mask oxygen  Post-op Assessment: Report given to RN and Post -op Vital signs reviewed and stable  Post vital signs: Reviewed and stable  Last Vitals:  Vitals:   08/22/16 1938 08/22/16 1939  BP: 134/78   Pulse:    Resp: 17   Temp:  36.6 C    Last Pain:  Vitals:   08/22/16 1939  TempSrc:   PainSc: 0-No pain      Patients Stated Pain Goal: 0 (93/96/88 6484)  Complications: No apparent anesthesia complications

## 2016-08-22 NOTE — Anesthesia Procedure Notes (Signed)
Procedure Name: Intubation Date/Time: 08/22/2016 3:44 PM Performed by: Garrison [place] T Pre-anesthesia Checklist: Patient identified, Emergency Drugs available, Suction available and Patient being monitored Patient Re-evaluated:Patient Re-evaluated prior to inductionOxygen Delivery Method: Circle System Utilized Preoxygenation: Pre-oxygenation with 100% oxygen Intubation Type: IV induction Laryngoscope Size: Miller and 2 Grade View: Grade I Endobronchial tube: Double lumen EBT, EBT position confirmed by auscultation and EBT position confirmed by fiberoptic bronchoscope and 39 Fr Number of attempts: 1 Airway Equipment and Method: Stylet and Oral airway Placement Confirmation: ETT inserted through vocal cords under direct vision,  positive ETCO2 and breath sounds checked- equal and bilateral Tube secured with: Tape Dental Injury: Teeth and Oropharynx as per pre-operative assessment

## 2016-08-22 NOTE — Progress Notes (Signed)
Patient ID: Frank Rosales, male   DOB: 1955-07-12, 61 y.o.   MRN: [id number]  [facility] Surgery Progress Note     Subjective: CC- hemothorax Patient reports improved breathing throughout the day yesterday, but worsening last night. States that he just completed a breathing treatment and is feeling a little better. Pain is well controlled.   Objective: Vital signs in last 24 hours: Temp:  [97.9 F (36.6 C)] 97.9 F (36.6 C) (04/10 2043) Pulse Rate:  [83-95] 94 (04/11 0723) Resp:  [16-19] 18 (04/11 0723) BP: (108-155)/(68-69) 108/69 (04/10 2043) SpO2:  [94 %-95 %] 95 % (04/11 0723)    Intake/Output from previous day: 04/10 0701 - 04/11 0700 In: 2310 [P.O.:220; I.V.:2090] Out: 1225 [Urine:1225] Intake/Output this shift: No intake/output data recorded.  PE: Gen: Alert, NAD, pleasant and cooperative Card: regular rate and rhythm Pulm: non-labored, coarse breath sounds bilaterally with mild expiratory wheezes, on 3L O2 [place] Abd: Soft, non-tender, non-distended, no HSM or hernia Ext: No erythema, edema, or tenderness  BUE/BLE  Lab Results:   Recent Labs  08/20/16 2034 08/21/16 0336 08/21/16 1233  WBC 12.4*  --  17.1*  HGB 14.2 12.2* 13.2  HCT 40.2 34.6* 38.2*  PLT 390  --  369   BMET  Recent Labs  08/20/16 2034 08/21/16 1233  NA 127* 127*  K 3.3* 5.0  CL 90* 95*  CO2 23 25  GLUCOSE 127* 134*  BUN 8 10  CREATININE 0.91 0.99  CALCIUM 8.5* 8.4*   PT/INR  Recent Labs  08/21/16 1424  LABPROT 14.8  INR 1.16   CMP     Component Value Date/Time   NA 127 (L) 08/21/2016 1233   NA 136 03/07/2016 0944   K 5.0 08/21/2016 1233   CL 95 (L) 08/21/2016 1233   CO2 25 08/21/2016 1233   GLUCOSE 134 (H) 08/21/2016 1233   BUN 10 08/21/2016 1233   BUN 7 03/07/2016 0944   CREATININE 0.99 08/21/2016 1233   CREATININE 0.96 08/06/2012 1619   CALCIUM 8.4 (L) 08/21/2016 1233   PROT 6.0 (L) 08/21/2016 1233   PROT 7.4 03/07/2016 0944   ALBUMIN 3.6 08/21/2016  1233   ALBUMIN 4.6 03/07/2016 0944   AST 23 08/21/2016 1233   ALT 28 08/21/2016 1233   ALKPHOS 103 08/21/2016 1233   BILITOT 1.2 08/21/2016 1233   BILITOT 0.8 03/07/2016 0944   GFRNONAA >60 08/21/2016 1233   GFRAA >60 08/21/2016 1233   Lipase     Component Value Date/Time   LIPASE 37 06/02/2015 0935       Studies/Results: Dg Chest 2 View  Result Date: 08/20/2016 CLINICAL DATA:  Left-sided chest pain EXAM: CHEST  2 VIEW COMPARISON:  Chest CT 08/05/2016, CXR 08/09/2016 FINDINGS: Obscuration of the left hemidiaphragm and portions of the left heart border secondary to presumed increase in left pleural effusion and compressive atelectasis. This spans 3-1/2 half vertebral body heights on the lateral view. Left fifth through eighth rib fractures are again noted with minimal displacement and overlying subcutaneous emphysema. No pneumothorax identified. No mediastinal shift. Right lung remains clear. Subacute to chronic appearing fractures on the right involving the eighth and ninth ribs. No aortic aneurysm. IMPRESSION: Acute minimally displaced known left fifth through eighth rib fractures. Subacute to chronic right eighth and ninth rib fractures. Interval increase in left moderate pleural effusion and compressive atelectasis. No appreciable pneumothorax identified. Subcutaneous emphysema overlying the rib fractures. Electronically Signed   By: Ashley Royalty M.D.   On: 08/20/2016  21:50   Ct Angio Chest Pe W And/or Wo Contrast  Result Date: 08/21/2016 CLINICAL DATA:  Acute onset of left-sided chest pain and shortness of breath. Initial encounter. EXAM: CT ANGIOGRAPHY CHEST WITH CONTRAST TECHNIQUE: Multidetector CT imaging of the chest was performed using the standard protocol during bolus administration of intravenous contrast. Multiplanar CT image reconstructions and MIPs were obtained to evaluate the vascular anatomy. CONTRAST:  100 mL of Isovue 370 IV contrast COMPARISON:  Chest radiograph performed  08/20/2016, and CT of the chest performed 08/05/2016 FINDINGS: Cardiovascular: The heart is normal in size. The thoracic aorta is grossly unremarkable. Minimal calcification is seen along the proximal great vessels. Mediastinum/Nodes: A 1.5 cm azygoesophageal recess node is seen. No pericardial effusion is identified. The thyroid gland is grossly unremarkable. No axillary lymphadenopathy is appreciated. Lungs/Pleura: There has been significant interval increase in a moderate to large loculated left-sided pleural effusion. New areas of increased attenuation are noted within the effusion, suspicious for interval hemorrhage into the left hemithorax since the prior study. Underlying atelectasis is noted. Mild right basilar atelectasis is noted. Scattered peripheral blebs are noted at the lung apices. No pneumothorax is seen. No dominant mass is identified. Upper Abdomen: The visualized portions of the liver and spleen are grossly unremarkable. The visualized portions of the pancreas, adrenal glands and kidneys are within normal limits. Mild nonspecific perinephric stranding is noted bilaterally. Musculoskeletal: Fractures of the left lateral fifth through eighth ribs are again noted, with comminution at the left sixth and seventh ribs. Associated soft tissue hemorrhage is seen along the left chest wall, with residual soft tissue air along the left chest wall. There are chronic compression deformities of vertebral bodies T12 and L1, with changes of vertebroplasty at L1. Review of the MIP images confirms the above findings. IMPRESSION: 1. Significant interval increase and moderate to large loculated right-sided pleural effusion. New areas of increased attenuation within the effusion, suspicious for interval hemorrhage into the left hemithorax since the prior study. Underlying atelectasis noted. Infection cannot be entirely excluded, though there is no evidence of empyema at this time. 2. Fractures of the left lateral  fifth through eighth ribs again noted, with comminution at the left sixth and seventh ribs. Associated soft tissue hemorrhage along the left chest wall, with residual soft tissue air along the left chest wall. 3. Mild right basilar atelectasis. Scattered peripheral blebs at the lung apices. 4. 1.5 cm azygoesophageal recess node noted. 5. Chronic compression deformities of vertebral bodies T12 and L1, with changes of vertebroplasty at L1. These results were called by telephone at the time of interpretation on 08/21/2016 at 2:38 am to Dr. Jola Schmidt, who verbally acknowledged these results. Electronically Signed   By: Garald Balding M.D.   On: 08/21/2016 02:40    Anti-infectives: Anti-infectives    Start     Dose/Rate Route Frequency Ordered Stop   08/22/16 0800  cefUROXime (ZINACEF) 1.5 g in dextrose 5 % 50 mL IVPB     1.5 g 100 mL/hr over 30 Minutes Intravenous 60 min pre-op 08/21/16 1217         Assessment/Plan Ground level fall 3/25 - admitted 3/25 through 3/29 Large left pleural effusion/hemothorax - readmitted 4/10; for VATS/thoracotomy +/- rib plating today with CT surgery Left rib fractures 5-8- IS/pulmonary toilet   COPD - dulera BID and duonebs TID and PRN; encourage IS HTN - home meds HCTZ, nifedipine, irbesartan Hypothyroidism - synthroid Tobacco abuse  FEN - IVF, NPO for procedure VTE - SCDs,  lovenox  Dispo - OR today with CT surgery   LOS: 1 day    Jerrye Beavers , [facility] Surgery 08/22/2016, 7:46 AM Pager: [phone] Consults: [phone] Mon-Fri 7:00 am-4:30 pm Sat-Sun 7:00 am-11:30 am

## 2016-08-22 NOTE — H&P (View-Only) (Signed)
Reason for Consult:hemothorax/ rib fractures Referring Physician: Dr. Wynelle Link is an 61 y.o. male.  HPI: 61 yo man with a history of COPD and rib fractures admitted with acute increase in pain and shortness of breath.  Frank Rosales is a 61 yo man with a history of tobacco abuse, COPD, anxiety, hypertension, hyperlipidemia and hypothyroidism. He was admitted last week after suffering a fall and multiple rib fractures. Discharged on 3/30. Yesterday noted abrupt onset of increased pleuritic CP and shortness of breath. Came to ED. CT to r/o PE showed a loculated left pleural effusion and multiple rib fractures.  Past Medical History:  Diagnosis Date  . Anxiety   . Arthritis    "neck; lower back; right hip; right knee" (10/18/2014)  . Asthma   . Colon polyps   . COPD (chronic obstructive pulmonary disease) ([place])   . High blood pressure   . High cholesterol   . Hilar adenopathy   . Hypothyroidism     Past Surgical History:  Procedure Laterality Date  . BACK SURGERY    . epidural injections    . FIXATION KYPHOPLASTY LUMBAR SPINE     "L1"  . KNEE ARTHROSCOPY Right 05/14/2010  . WISDOM TOOTH EXTRACTION      Family History  Problem Relation Age of Onset  . Emphysema Father   . Lung cancer Father   . Heart disease Brother   . Heart failure Mother     Social History:  reports that he has been smoking Cigarettes.  He started smoking about 43 years ago. He has a 70.00 pack-year smoking history. He has never used smokeless tobacco. He reports that he drinks about 25.2 oz of alcohol per week . He reports that he does not use drugs.  Allergies: No Known Allergies  Medications:  Scheduled: . albuterol  2.5 mg Nebulization QID  . [START ON 08/22/2016] enoxaparin (LOVENOX) injection  40 mg Subcutaneous Q24H  . irbesartan  300 mg Oral Daily   And  . hydrochlorothiazide  25 mg Oral Daily  . iopamidol      . levothyroxine  50 mcg Oral QAC breakfast  . mometasone-formoterol  2 puff  Inhalation BID  . NIFEdipine  90 mg Oral Daily    Results for orders placed or performed during the hospital encounter of 08/20/16 (from the past 48 hour(s))  Basic metabolic panel     Status: Abnormal   Collection Time: 08/20/16  8:34 PM  Result Value Ref Range   Sodium 127 (L) 135 - 145 mmol/L   Potassium 3.3 (L) 3.5 - 5.1 mmol/L   Chloride 90 (L) 101 - 111 mmol/L   CO2 23 22 - 32 mmol/L   Glucose, Bld 127 (H) 65 - 99 mg/dL   BUN 8 6 - 20 mg/dL   Creatinine, Ser 0.91 0.61 - 1.24 mg/dL   Calcium 8.5 (L) 8.9 - 10.3 mg/dL   GFR calc non Af Amer >60 >60 mL/min   GFR calc Af Amer >60 >60 mL/min    Comment: (NOTE) The eGFR has been calculated using the CKD EPI equation. This calculation has not been validated in all clinical situations. eGFR's persistently <60 mL/min signify possible Chronic Kidney Disease.    Anion gap 14 5 - 15  CBC     Status: Abnormal   Collection Time: 08/20/16  8:34 PM  Result Value Ref Range   WBC 12.4 (H) 4.0 - 10.5 K/uL   RBC 4.15 (L) 4.22 - 5.81 MIL/uL  Hemoglobin 14.2 13.0 - 17.0 g/dL   HCT 40.2 39.0 - 52.0 %   MCV 96.9 78.0 - 100.0 fL   MCH 34.2 (H) 26.0 - 34.0 pg   MCHC 35.3 30.0 - 36.0 g/dL   RDW 13.0 11.5 - 15.5 %   Platelets 390 150 - 400 K/uL  I-stat troponin, ED     Status: None   Collection Time: 08/20/16  8:49 PM  Result Value Ref Range   Troponin i, poc 0.00 0.00 - 0.08 ng/mL   Comment 3            Comment: Due to the release kinetics of cTnI, a negative result within the first hours of the onset of symptoms does not rule out myocardial infarction with certainty. If myocardial infarction is still suspected, repeat the test at appropriate intervals.   Hemoglobin and hematocrit, blood     Status: Abnormal   Collection Time: 08/21/16  3:36 AM  Result Value Ref Range   Hemoglobin 12.2 (L) 13.0 - 17.0 g/dL   HCT 34.6 (L) 39.0 - 52.0 %  Type and screen [place]     Status: None   Collection Time: 08/21/16  3:36 AM   Result Value Ref Range   ABO/RH(D) A POS    Antibody Screen NEG    Sample Expiration 08/24/2016   ABO/Rh     Status: None (Preliminary result)   Collection Time: 08/21/16  3:36 AM  Result Value Ref Range   ABO/RH(D) A POS     Dg Chest 2 View  Result Date: 08/20/2016 CLINICAL DATA:  Left-sided chest pain EXAM: CHEST  2 VIEW COMPARISON:  Chest CT 08/05/2016, CXR 08/09/2016 FINDINGS: Obscuration of the left hemidiaphragm and portions of the left heart border secondary to presumed increase in left pleural effusion and compressive atelectasis. This spans 3-1/2 half vertebral body heights on the lateral view. Left fifth through eighth rib fractures are again noted with minimal displacement and overlying subcutaneous emphysema. No pneumothorax identified. No mediastinal shift. Right lung remains clear. Subacute to chronic appearing fractures on the right involving the eighth and ninth ribs. No aortic aneurysm. IMPRESSION: Acute minimally displaced known left fifth through eighth rib fractures. Subacute to chronic right eighth and ninth rib fractures. Interval increase in left moderate pleural effusion and compressive atelectasis. No appreciable pneumothorax identified. Subcutaneous emphysema overlying the rib fractures. Electronically Signed   By: Ashley Royalty M.D.   On: 08/20/2016 21:50   Ct Angio Chest Pe W And/or Wo Contrast  Result Date: 08/21/2016 CLINICAL DATA:  Acute onset of left-sided chest pain and shortness of breath. Initial encounter. EXAM: CT ANGIOGRAPHY CHEST WITH CONTRAST TECHNIQUE: Multidetector CT imaging of the chest was performed using the standard protocol during bolus administration of intravenous contrast. Multiplanar CT image reconstructions and MIPs were obtained to evaluate the vascular anatomy. CONTRAST:  100 mL of Isovue 370 IV contrast COMPARISON:  Chest radiograph performed 08/20/2016, and CT of the chest performed 08/05/2016 FINDINGS: Cardiovascular: The heart is normal in  size. The thoracic aorta is grossly unremarkable. Minimal calcification is seen along the proximal great vessels. Mediastinum/Nodes: A 1.5 cm azygoesophageal recess node is seen. No pericardial effusion is identified. The thyroid gland is grossly unremarkable. No axillary lymphadenopathy is appreciated. Lungs/Pleura: There has been significant interval increase in a moderate to large loculated left-sided pleural effusion. New areas of increased attenuation are noted within the effusion, suspicious for interval hemorrhage into the left hemithorax since the prior study. Underlying atelectasis  is noted. Mild right basilar atelectasis is noted. Scattered peripheral blebs are noted at the lung apices. No pneumothorax is seen. No dominant mass is identified. Upper Abdomen: The visualized portions of the liver and spleen are grossly unremarkable. The visualized portions of the pancreas, adrenal glands and kidneys are within normal limits. Mild nonspecific perinephric stranding is noted bilaterally. Musculoskeletal: Fractures of the left lateral fifth through eighth ribs are again noted, with comminution at the left sixth and seventh ribs. Associated soft tissue hemorrhage is seen along the left chest wall, with residual soft tissue air along the left chest wall. There are chronic compression deformities of vertebral bodies T12 and L1, with changes of vertebroplasty at L1. Review of the MIP images confirms the above findings. IMPRESSION: 1. Significant interval increase and moderate to large loculated right-sided pleural effusion. New areas of increased attenuation within the effusion, suspicious for interval hemorrhage into the left hemithorax since the prior study. Underlying atelectasis noted. Infection cannot be entirely excluded, though there is no evidence of empyema at this time. 2. Fractures of the left lateral fifth through eighth ribs again noted, with comminution at the left sixth and seventh ribs. Associated  soft tissue hemorrhage along the left chest wall, with residual soft tissue air along the left chest wall. 3. Mild right basilar atelectasis. Scattered peripheral blebs at the lung apices. 4. 1.5 cm azygoesophageal recess node noted. 5. Chronic compression deformities of vertebral bodies T12 and L1, with changes of vertebroplasty at L1. These results were called by telephone at the time of interpretation on 08/21/2016 at 2:38 am to Dr. Jola Schmidt, who verbally acknowledged these results. Electronically Signed   By: Garald Balding M.D.   On: 08/21/2016 02:40    Review of Systems  Constitutional: Negative for chills and fever.  Respiratory: Positive for shortness of breath and wheezing.   Cardiovascular: Positive for chest pain.  Gastrointestinal: Negative for nausea and vomiting.  Musculoskeletal:       Chest wall pain  Psychiatric/Behavioral: The patient is nervous/anxious.   All other systems reviewed and are negative.  Blood pressure (!) 155/68, pulse 83, temperature 98.8 F (37.1 C), temperature source Oral, resp. rate 16, height _0  (1.753 m), weight 200 lb (90.7 kg), SpO2 95 %. Physical Exam  Vitals reviewed. Constitutional: He is oriented to person, place, and time. He appears well-developed. He appears distressed (mildly).  HENT:  Head: Normocephalic and atraumatic.  Mouth/Throat: No oropharyngeal exudate.  Eyes: Conjunctivae and EOM are normal.  Neck: No thyromegaly present.  Cardiovascular: Normal rate, regular rhythm and normal heart sounds.   No murmur heard. Respiratory: No respiratory distress. He has wheezes (diffuse, severe).  Left flank ecchymoses  GI: Soft. He exhibits no distension. There is no tenderness.  Musculoskeletal: Normal range of motion. He exhibits no edema.  Lymphadenopathy:    He has no cervical adenopathy.  Neurological: He is alert and oriented to person, place, and time. No cranial nerve deficit.  No focal motor deficit  Skin: Skin is warm and  dry.    Assessment/Plan: 61 yo man with recent fall resulting in multiple rib fractures. He now presents with increased pain and shortness of breath and has a loculated pleural effusion. He also is wheezing severely and needs immediate attention to that.  COPD- will order nebs and ask Pulmonary to see  Rib fractures/ Hemothorax- hemothorax is loculated and still having pain from rib fractures. I think he would benefit from a VATS/ thoracotomy to drain the effusion  and also would likely benefit from rib plating to stabilize the fractures.  I discussed the general nature of the procedure, the need for general anesthesia, and the incisions to be used with Mr and Mrs Bruun. We discussed the expected hospital stay, overall recovery and short and long term outcomes. I informed them of the indications, risks, benefits and alternatives. They understand the risks include, but are not limited to death, stroke, MI, DVT/PE, bleeding, possible need for transfusion, infections, respiratory failure, as well as other organ system dysfunction including renal or GI complications.   He accept the risks and agrees to proceed.  Will plan surgery tomorrow if wheezing improves with medical therapy  [name] Nakayama 08/21/2016, 9:00 AM

## 2016-08-22 NOTE — Progress Notes (Signed)
Called at 2200 by Radiology wanted MD to be notified of CXR results regarding left central line. Dr. Servando Snare called [phone] and results read to him regarding left central line. Advised to leave it in for now and no pressors are to be used and use peripheral IVs for now. Will continue to monitor.

## 2016-08-22 NOTE — Anesthesia Procedure Notes (Signed)
Anesthesia Procedure Note [facility] CVP Dual Lumen: 1455-1510: The patient was identified and consent obtained.  TO was performed, and full barrier precautions were used.  The skin was anesthetized with lidocaine.  Once the vein was located with the 22 ga. needle using ultrasound guidance , the wire was inserted into the vein.  The wire location was confirmed with ultrasound.  The tissue was dilated and the catheter was carefully inserted, then sutured in place. A dressing was applied. The patient tolerated the procedure well.

## 2016-08-22 NOTE — Brief Op Note (Signed)
08/20/2016 - 08/22/2016  6:50 PM  PATIENT:  Frank Rosales  61 y.o. male  PRE-OPERATIVE DIAGNOSIS:  1.HEMOTHORAX 2. LOCULATED LEFT PLEURAL EFFUSION 3. RIB FRACTURES SECONDARY TO FALL  POST-OPERATIVE DIAGNOSIS:  1.HEMOTHORAX 2. LOCULATED LEFT PLEURAL EFFUSION 3. RIB FRACTURES SECONDARY TO FALL  PROCEDURE:  LEFT VIDEO ASSISTED THORACOSCOPY , DRAINAGE OF LEFT HEMOTHORAX, ORIF/ RIB PLATING LEFT RIBS 6 and 7  SURGEON:  Surgeon(s) and Role:    * [name] Nakayama, MD - Primary  PHYSICIAN ASSISTANTS: 1. Lars Pinks PA-C 2. Wayne Gold PA-C  ANESTHESIA:   general  EBL:  Total I/O In: 1600 [I.V.:1600] Out: 905 [Urine:675; Blood:230]  BLOOD ADMINISTERED:none  DRAINS: Chest tube and Blake drains placed in the left pleural space   SPECIMEN:  Source of Specimen:  Pleural clot, hemothorax  DISPOSITION OF SPECIMEN:  Pathology, culture  COUNTS CORRECT:  YES  DICTATION: .Dragon Dictation  PLAN OF CARE: Admit to inpatient   PATIENT DISPOSITION:  PACU - hemodynamically stable.   Delay start of Pharmacological VTE agent (>24hrs) due to surgical blood loss or risk of bleeding: yes

## 2016-08-22 NOTE — Progress Notes (Signed)
Patient ID: Frank Rosales, male   DOB: 1956/04/28, 61 y.o.   MRN: [id number] EVENING ROUNDS NOTE :     [address]       [place] [address]             [phone]                 Day of Surgery Procedure(s) (LRB): VIDEO ASSISTED THORACOSCOPY (Left) DRAINAGE OF HEMOTHORAX (Left) RIB PLATING (Left)  Total Length of Stay:  LOS: 1 day  BP 127/78   Pulse (!) 119   Temp 97.8 F (36.6 C)   Resp 15   Ht 5\' 9"  (1.753 m)   Wt 200 lb (90.7 kg)   SpO2 92%   BMI 29.53 kg/m   .Intake/Output      04/11 0701 - 04/12 0700   P.O.    I.V. (mL/kg) 2000 (22.1)   Total Intake(mL/kg) 2000 (22.1)   Urine (mL/kg/hr) 825 (0.6)   Blood 230 (0.2)   Chest Tube 300 (0.2)   Total Output 1355   Net +645         . dextrose 5 % and 0.9% NaCl 125 mL/hr at 08/22/16 2108     Lab Results  Component Value Date   WBC 17.1 (H) 08/21/2016   HGB 10.5 (L) 08/22/2016   HCT 31.0 (L) 08/22/2016   PLT 369 08/21/2016   GLUCOSE 134 (H) 08/21/2016   CHOL 197 03/07/2016   TRIG 151 (H) 03/07/2016   HDL 49 03/07/2016   LDLCALC 118 (H) 03/07/2016   ALT 28 08/21/2016   AST 23 08/21/2016   NA 130 (L) 08/22/2016   K 4.5 08/22/2016   CL 95 (L) 08/21/2016   CREATININE 0.99 08/21/2016   BUN 10 08/21/2016   CO2 25 08/21/2016   TSH 2.010 03/07/2016   PSA 0.9 02/06/2013   INR 1.16 08/21/2016   HGBA1C 5.9 (H) 03/07/2016   Stable extubated, small air leak from ct  Grace Isaac MD  Beeper [phone] Office [phone] 08/22/2016 9:16 PM

## 2016-08-22 NOTE — Anesthesia Preprocedure Evaluation (Addendum)
Anesthesia Evaluation  Patient identified by MRN, date of birth, ID band Patient awake    Reviewed: Allergy & Precautions, NPO status , Patient's Chart, lab work & pertinent test results  Airway Mallampati: II  TM Distance: >3 FB     Dental   Pulmonary asthma , COPD,  COPD inhaler, Current Smoker, former smoker,   multiple left-sided rib fractures    breath sounds clear to auscultation       Cardiovascular hypertension, Pt. on medications  Rhythm:Regular Rate:Normal     Neuro/Psych PSYCHIATRIC DISORDERS Anxiety    GI/Hepatic negative GI ROS, Neg liver ROS, (+)     substance abuse  alcohol use,   Endo/Other  negative endocrine ROSHypothyroidism   Renal/GU negative Renal ROS     Musculoskeletal  (+) Arthritis ,   Abdominal   Peds  Hematology   Anesthesia Other Findings   Reproductive/Obstetrics                            BP Readings from Last 3 Encounters:  08/21/16 108/69  08/10/16 117/76  07/03/16 130/85   Lab Results  Component Value Date   WBC 17.1 (H) 08/21/2016   HGB 13.2 08/21/2016   HCT 38.2 (L) 08/21/2016   MCV 97.4 08/21/2016   PLT 369 08/21/2016     Chemistry      Component Value Date/Time   NA 127 (L) 08/21/2016 1233   NA 136 03/07/2016 0944   K 5.0 08/21/2016 1233   CL 95 (L) 08/21/2016 1233   CO2 25 08/21/2016 1233   BUN 10 08/21/2016 1233   BUN 7 03/07/2016 0944   CREATININE 0.99 08/21/2016 1233   CREATININE 0.96 08/06/2012 1619      Component Value Date/Time   CALCIUM 8.4 (L) 08/21/2016 1233   ALKPHOS 103 08/21/2016 1233   AST 23 08/21/2016 1233   ALT 28 08/21/2016 1233   BILITOT 1.2 08/21/2016 1233   BILITOT 0.8 03/07/2016 0944      Anesthesia Physical Anesthesia Plan  ASA: III  Anesthesia Plan: General   Post-op Pain Management:    Induction: Intravenous  Airway Management Planned: Double Lumen EBT  Additional Equipment:   Intra-op  Plan:   Post-operative Plan: Possible Post-op intubation/ventilation  Informed Consent: I have reviewed the patients History and Physical, chart, labs and discussed the procedure including the risks, benefits and alternatives for the proposed anesthesia with the patient or authorized representative who has indicated his/her understanding and acceptance.   Dental advisory given  Plan Discussed with: CRNA and Anesthesiologist  Anesthesia Plan Comments:         Anesthesia Quick Evaluation

## 2016-08-22 NOTE — Interval H&P Note (Signed)
History and Physical Interval Note:  08/22/2016 2:25 PM  [name]  has presented today for surgery, with the diagnosis of HEMOTHORAX EFFUSION RIB FRACTURES   The various methods of treatment have been discussed with the patient and family. After consideration of risks, benefits and other options for treatment, the patient has consented to  Procedure(s): VIDEO ASSISTED THORACOSCOPY (Left) DRAINAGE OF HEMOTHORAX (Left) RIB PLATING (Left) as a surgical intervention .  The patient's history has been reviewed, patient examined, no change in status, stable for surgery.  I have reviewed the patient's chart and labs.  Questions were answered to the patient's satisfaction.     Frank Rosales

## 2016-08-23 ENCOUNTER — Inpatient Hospital Stay (HOSPITAL_COMMUNITY): Payer: 59

## 2016-08-23 LAB — BASIC METABOLIC PANEL
Anion gap: 4 — ABNORMAL LOW (ref 5–15)
BUN: 6 mg/dL (ref 6–20)
CHLORIDE: 99 mmol/L — AB (ref 101–111)
CO2: 27 mmol/L (ref 22–32)
Calcium: 7.9 mg/dL — ABNORMAL LOW (ref 8.9–10.3)
Creatinine, Ser: 0.76 mg/dL (ref 0.61–1.24)
GFR calc Af Amer: >60 mL/min (ref >60)
GFR calc non Af Amer: >60 mL/min (ref >60)
Glucose, Bld: 120 mg/dL — ABNORMAL HIGH (ref 65–99)
POTASSIUM: 4.2 mmol/L (ref 3.5–5.1)
SODIUM: 130 mmol/L — AB (ref 135–145)

## 2016-08-23 LAB — CBC
HEMATOCRIT: 28.4 % — AB (ref 39.0–52.0)
HEMOGLOBIN: 10 g/dL — AB (ref 13.0–17.0)
MCH: 34.8 pg — AB (ref 26.0–34.0)
MCHC: 35.2 g/dL (ref 30.0–36.0)
MCV: 99 fL (ref 78.0–100.0)
PLATELETS: 279 10*3/uL (ref 150–400)
RBC: 2.87 MIL/uL — AB (ref 4.22–5.81)
RDW: 13.7 % (ref 11.5–15.5)
WBC: 12.6 10*3/uL — AB (ref 4.0–10.5)

## 2016-08-23 LAB — BLOOD GAS, ARTERIAL
Acid-Base Excess: 2.9 mmol/L — ABNORMAL HIGH (ref 0.0–2.0)
BICARBONATE: 27.2 mmol/L (ref 20.0–28.0)
DRAWN BY: 44135
O2 Content: 4 L/min
O2 Saturation: 95.9 %
PH ART: 7.404 (ref 7.350–7.450)
PO2 ART: 80.5 mmHg — AB (ref 83.0–108.0)
Patient temperature: 98.6
pCO2 arterial: 44.4 mmHg (ref 32.0–48.0)

## 2016-08-23 MED ORDER — CHLORHEXIDINE GLUCONATE CLOTH 2 % EX PADS: 6.0000 | MEDICATED_PAD | Freq: Every day | CUTANEOUS | Status: DC

## 2016-08-23 MED ORDER — CALCIUM GLUCONATE 10 % IV SOLN
1.0000 g | Freq: Once | INTRAVENOUS | Status: AC
  Administered 2016-08-23: 1 g via INTRAVENOUS
  Filled 2016-08-23: qty 10

## 2016-08-23 MED ORDER — ALBUTEROL SULFATE (2.5 MG/3ML) 0.083% IN NEBU
2.5000 mg | INHALATION_SOLUTION | Freq: Four times a day (QID) | RESPIRATORY_TRACT | Status: DC | PRN
  Administered 2016-08-24 – 2016-08-29 (×2): 2.5 mg via RESPIRATORY_TRACT
  Filled 2016-08-23 (×3): qty 3

## 2016-08-23 MED ORDER — GUAIFENESIN ER 600 MG PO TB12
1200.0000 mg | ORAL_TABLET | Freq: Two times a day (BID) | ORAL | Status: DC | PRN
  Administered 2016-08-23 – 2016-08-29 (×8): 1200 mg via ORAL
  Filled 2016-08-23 (×8): qty 2

## 2016-08-23 MED ORDER — SODIUM CHLORIDE 0.9 % IV SOLN
INTRAVENOUS | Status: AC
  Administered 2016-08-23: 08:00:00 via INTRAVENOUS

## 2016-08-23 NOTE — Discharge Instructions (Signed)
Thoracotomy, Care After ° °This sheet gives you information about how to care for yourself after your procedure. Your doctor may also give you more specific instructions. If you have problems or questions, contact your doctor. °Follow these instructions at home: °Preventing lung infection (  °pneumonia) °· Take deep breaths or do breathing exercises as told by your doctor. °· Cough often. Coughing is important to clear thick spit (phlegm) and open your lungs. If coughing hurts, hold a pillow against your chest or place both hands flat on top of your cut (splinting) when you cough. This may help with discomfort. °· Use an incentive spirometer as told. This is a tool that measures how well you fill your lungs with each breath. °· Do lung therapy (pulmonary rehabilitation) as told. °Medicines °· Take over-the-counter or prescription medicines only as told by your doctor. °· If you have pain, take pain-relieving medicine before your pain gets very bad. This will help you breathe and cough more comfortably. °· If you were prescribed an antibiotic medicine, take it as told by your doctor. Do not stop taking the antibiotic even if you start to feel better. °Activity °· Ask your doctor what activities are safe for you. °· Do not travel by airplane for 2 weeks after your chest tube is removed, or until your doctor says that this is safe. °· Do not lift anything that is heavier than 10 lb (4.5 kg), or the limit that your doctor tells you, until he or she says that it is safe. °· Do not drive until your doctor approves. °¨ Do not drive or use heavy machinery while taking prescription pain medicine. °Incision care °· Follow instructions from your doctor about how to take care of your cut from surgery (incision). Make sure you: °¨ Wash your hands with soap and water before you change your bandage (dressing). If you cannot use soap and water, use hand sanitizer. °¨ Change your bandage as told by your doctor. °¨ Leave stitches  (sutures), skin glue, or skin tape (adhesive) strips in place. They may need to stay in place for 2 weeks or longer. If tape strips get loose and curl up, you may trim the loose edges. Do not remove tape strips completely unless your doctor says it is okay. °· Keep your bandage dry. °· Check your cut from surgery every day for signs of infection. Check for: °¨ More redness, swelling, or pain. °¨ More fluid or blood. °¨ Warmth. °¨ Pus or a bad smell. °Bathing °· Do not take baths, swim, or use a hot tub until your doctor approves. You may take showers. °· After your bandage has been removed, use soap and water to gently wash your cut from surgery. Do not use anything else to clean your cut unless your doctor tells you to. °Eating and drinking °· Eat a healthy diet as told by your doctor. A healthy diet includes: °¨ Fresh fruits and vegetables. °¨ Whole grains. °¨ Low-fat (lean) proteins. °· Drink enough fluid to keep your pee (urine) clear or pale yellow. °General instructions °· To prevent or treat trouble pooping (constipation) while you are taking prescription pain medicine, your doctor may recommend that you: °¨ Take over-the-counter or prescription medicines. °¨ Eat foods that are high in fiber. These include fresh fruits and vegetables, whole grains, and beans. °¨ Limit foods that are high in fat and processed sugars, such as fried and sweet foods. °· Do not use any products that contain nicotine or tobacco. These   include cigarettes and e-cigarettes. If you need help quitting, ask your doctor. °· Avoid secondhand smoke. °· Wear compression stockings as told. These help to prevent blood clots and reduce swelling in your legs. °· If you have a chest tube, care for it as told. °· Keep all follow-up visits as told by your doctor. This is important. °Contact a doctor if: °· You have more redness, swelling, or pain around your cut from surgery. °· You have more fluid or blood coming from your cut from  surgery. °· Your cut from surgery feels warm to the touch. °· You have pus or a bad smell coming from your cut from surgery. °· You have a fever or chills. °· Your heartbeat seems uneven. °· You feel sick to your stomach (nauseous). °· You throw up (vomit). °· You have muscle aches. °· You have trouble pooping (having a bowel movement). This may mean that you: °¨ Poop fewer times in a week than normal. °¨ Have a hard time pooping. °¨ Have poop that is dry, hard, or bigger than normal. °Get help right away if: °· You get a rash. °· You feel light-headed. °· You feel like you might pass out (faint). °· You are short of breath. °· You have trouble breathing. °· You are confused. °· You have trouble talking. °· You have problems with your seeing (vision). °· You are not able to move. °· You lose feeling (have numbness) in your: °¨ Face. °¨ Arms. °¨ Legs. °· You pass out. °· You have a sudden, bad headache. °· You feel weak. °· You have chest pain. °· You have pain that: °¨ Is very bad. °¨ Gets worse, even with medicine. °Summary °· Take deep breaths, do breathing exercises, and cough often. This helps prevent lung infection (pneumonia). °· Do not drive until your doctor approves. Do not travel by airplane for 2 weeks after your chest tube is removed, or until your doctor says that this is safe. °· Check your cut from surgery every day for signs of infection. °· Eat a healthy diet. This includes fresh fruits and vegetables, whole grains, and low-fat (lean) proteins. °This information is not intended to replace advice given to you by your health care provider. Make sure you discuss any questions you have with your health care provider. °Document Released: 10/30/2011 Document Revised: 01/23/2016 Document Reviewed: 01/23/2016 °[employer] Interactive Patient Education © 2017 [employer] ° °

## 2016-08-23 NOTE — Op Note (Signed)
NAMEBRYSE, BLANCHETTE               ACCOUNT NO.:  [id number]  MEDICAL RECORD NO.:  [id number]  LOCATION:                                 FACILITY:  PHYSICIAN:  Revonda Standard. [name], M.D. DATE OF BIRTH:  DATE OF PROCEDURE:  08/22/2016 DATE OF DISCHARGE:                              OPERATIVE REPORT   PREOPERATIVE DIAGNOSIS:  Left hemothorax and rib fractures.  POSTOPERATIVE DIAGNOSIS:  Left hemothorax and rib fractures.  PROCEDURE:  Left thoracotomy, evacuation of hemothorax and decortication, plating of 6th and 7th ribs.  SURGEON:  Revonda Standard. [name], M.D.  ASSISTANT: 1. Lars Pinks, [place], PA-C.  ANESTHESIA:  General.  FINDINGS:  Both acute and chronic components of hemothorax. Dense adhesions of the lung to the chest wall inferiorly and laterally along the area of the 6th and 7th ribs. Decortication of lower lobe. Complex comminuted fractures of 6th and 7th ribs.  CLINICAL NOTE:  Mr. Reece is a 61 year old gentleman, who recently suffered a fall onto his left chest and suffered multiple rib fractures. He was treated conservatively initially and was discharged from the hospital.  He presented back with complaints of left-sided chest pain and shortness of breath.  CT of the chest showed no evidence of pulmonary embolus, but did show a complex loculated hemothorax in addition to the known rib fractures.  The patient was advised to undergo left thoracotomy for evacuation of the hemothorax and plating of the ribs for pain control.  The indications, risks, benefits, and alternatives were discussed in detail with the patient.  He understood and accepted the risks and agreed to proceed.  DESCRIPTION OF PROCEDURE:  Mr. Ing was brought to the operating room on August 22, 2016.  He had induction of general anesthesia and was intubated with a double-lumen endotracheal tube.  Intravenous antibiotics were administered.  Sequential compression devices  were placed on the calves for DVT prophylaxis.  A Foley catheter was placed. He was placed in a right lateral decubitus position and the left chest was prepped and draped in usual sterile fashion.  A left posterolateral thoracotomy was performed.  This was a serratus sparing thoracotomy. The latissimus was divided.  This was centered over the 6th interspace. The tissue around the fractured ribs was markedly edematous and fibrotic.  The chest was entered.  There were dense adhesions of the lung to the chest wall laterally.  Finally, a free space was entered and a large amount of bloody fluid was evacuated.  The space was freed up to allow placement of a port. An incision was made and a 5-mm port was inserted. The thoracoscope was advanced into the chest and used for visualization and lighting.  The lung then was freed up from the chest wall, the mediastinum, and diaphragm circumferentially to the hilum.  These adhesions were very dense, and in multiple areas there were small tears in the visceral pleura. The lung was ultimately freed up.  Multiple separate loculated fluid collections and areas containing clot were evacuated.  The chest was copiously irrigated with warm saline on multiple occasions to help evacuate all of clot.  A stapler was used to remove small areas of  the lung where a tear occurred where that was possible. This was an Echelon powered stapler with a 45-mm gold cartridges.  A 28- French chest tube was placed through the original port incision.  A second chest tube site was made and a 36-French Blake drain was placed and directed posteriorly.  Both chest tubes were secured with #1 silk sutures.  The left lung was reinflated.  The KLSMartin Rib Plating System then was used to plate the 6th and 7th rib fractures.  Four screws minimum were placed on each side of the fracture on both 6th and the 7th ribs.  The incision then was copiously irrigated with warm saline.  The  incision was closed in standard fashion.  The serratus muscle was reattached posterolaterally with a running #1 Vicryl suture.  The latissimus fascia was closed with a #1 Vicryl suture.  The subcutaneous tissue and skin were closed in standard fashion.  All sponge, needle, and instrument counts were correct at the end of the procedure.  The patient was taken to the [facility] in good condition.     Revonda Standard [name], M.D.     SCH/MEDQ  D:  08/22/2016  T:  08/22/2016  Job:  [id number]

## 2016-08-23 NOTE — Progress Notes (Signed)
Dr. Roxy Manns notified of new c/o 'chest tightness.'  EKG obtained, MD aware of finding.  No new orders received.  Will continue to monitor.

## 2016-08-23 NOTE — Progress Notes (Addendum)
Pt arrived to unit transferred from 2 heart in stable condition.  2 left mediastinal chest tubes to suction (-20) in place.  Assessment completed as charted.  PCA-Fentanyl in use.  PCA upon arrival: Volume to be infused:6.45ml 24 hr history Total dose administered=182mcg  11 demands 11 deliveries   Family present at bedside.  Pt and family oriented to room and unit with understanding verbalized.  Will continue to monitor.

## 2016-08-23 NOTE — Progress Notes (Signed)
1 Day Post-Op  Subjective: Up in chair, reports 1750 on IS  Objective: Vital signs in last 24 hours: Temp:  [97.8 F (36.6 C)-99.7 F (37.6 C)] 98.1 F (36.7 C) (04/12 0700) Pulse Rate:  [82-132] 91 (04/12 0800) Resp:  [11-33] 13 (04/12 0800) BP: (76-139)/(54-79) 76/54 (04/12 0800) SpO2:  [88 %-100 %] 93 % (04/12 0800) Arterial Line BP: (97)/(65) 97/65 (04/11 2008) Weight:  [91.5 kg (201 lb 11.5 oz)] 91.5 kg (201 lb 11.5 oz) (04/11 2153) Last BM Date: 08/20/16  Intake/Output from previous day: 04/11 0701 - 04/12 0700 In: 3283.3 [I.V.:3233.3; IV Piggyback:50] Out: 2951 [Urine:1350; Blood:230; Chest Tube:660] Intake/Output this shift: Total I/O In: 305 [P.O.:180; I.V.:125] Out: 30 [Urine:30]  General appearance: alert and cooperative Resp: clear to auscultation bilaterally Chest wall: L dressing Cardio: regular rate and rhythm GI: soft, NT, +BS  Neuro: A&O  Lab Results: CBC   Recent Labs  08/21/16 1233 08/22/16 1757 08/23/16 0513  WBC 17.1*  --  12.6*  HGB 13.2 10.5* 10.0*  HCT 38.2* 31.0* 28.4*  PLT 369  --  279   BMET  Recent Labs  08/21/16 1233 08/22/16 1757 08/23/16 0513  NA 127* 130* 130*  K 5.0 4.5 4.2  CL 95*  --  99*  CO2 25  --  27  GLUCOSE 134*  --  120*  BUN 10  --  6  CREATININE 0.99  --  0.76  CALCIUM 8.4*  --  7.9*   PT/INR  Recent Labs  08/21/16 1424  LABPROT 14.8  INR 1.16   ABG  Recent Labs  08/22/16 1957 08/23/16 0400  PHART 7.366 7.404  HCO3 23.6 27.2    Studies/Results: Dg Chest Port 1 View  Result Date: 08/23/2016 CLINICAL DATA:  Status post fixation of left sixth and seventh rib fractures, evacuation of left hemothorax and decortication 08/22/2016. EXAM: PORTABLE CHEST 1 VIEW COMPARISON:  Single-view of the chest 08/22/2016. CT chest 08/21/2016. FINDINGS: Left IJ catheter and 2 left chest tubes remain in place. Subcutaneous emphysema over the left chest is decreased. No pneumothorax is identified. Left basilar  opacities likely due to atelectasis are unchanged. The right lung is clear. IMPRESSION: Negative for pneumothorax with 2 left chest tubes in place. Subcutaneous emphysema over the left chest has decreased since yesterday's study. No change in left basilar airspace opacities likely due to atelectasis. Electronically Signed   By: Inge Rise M.D.   On: 08/23/2016 07:59   Dg Chest Port 1 View  Result Date: 08/22/2016 CLINICAL DATA:  LEFT-sided hemothorax. EXAM: PORTABLE CHEST 1 VIEW COMPARISON:  CT 08/21/2016 FINDINGS: Interval plate fixation of LEFT lateral rib fractures. Interval placement of 2 LEFT chest tubes. Evacuation of the fluid at the LEFT lung base. No pneumothorax evident. Subcutaneous gas noted along the LEFT chest wall. RIGHT lung is clear. New line placed with the with tip presumably within the brachiocephalic vein. Recommend correlation with venous return. IMPRESSION: 1. Two chest tubes in place without pneumothorax. 2. Reduction pleural fluid. 3. Plate fixation of rib fractures on LEFT. 4. Placement of LEFT central venous line with tip presumably in the brachycephalic vein. Recommend correlation with venous return. These results will be called to the ordering clinician or representative by the Radiologist Assistant, and communication documented in the PACS or zVision Dashboard. a Electronically Signed   By: Suzy Bouchard M.D.   On: 08/22/2016 21:41    Anti-infectives: Anti-infectives    Start     Dose/Rate Route Frequency Ordered Stop  08/23/16 0400  cefUROXime (ZINACEF) 1.5 g in dextrose 5 % 50 mL IVPB     1.5 g 100 mL/hr over 30 Minutes Intravenous Every 12 hours 08/22/16 2038 08/24/16 0359   08/22/16 0800  cefUROXime (ZINACEF) 1.5 g in dextrose 5 % 50 mL IVPB     1.5 g 100 mL/hr over 30 Minutes Intravenous 60 min pre-op 08/21/16 1217 08/22/16 1610      Assessment/Plan: Ground level fall 3/25 - admitted 3/25 through 3/29 Large left pleural effusion/hemothorax -  readmitted 4/10; S/P VATS/rib plating 4/11 [name]), still has air leak, CTs per TCTS, pulm toilet COPD - dulera BID and duonebs TID and PRN; encourage IS HTN - hold home meds until BP up Hypothyroidism - synthroid Tobacco abuse FEN - fluid bolus for BP, fulls only as no appetite VTE - SCDs, lovenox Dispo - ICU  LOS: 2 days    Georganna Skeans, MD, MPH, FACS Trauma: [phone] General Surgery: [phone]  4/12/2018Patient ID: Frank Rosales, male   DOB: 03-Apr-1956, 61 y.o.   MRN: [id number]

## 2016-08-23 NOTE — Progress Notes (Signed)
1 Day Post-Op Procedure(s) (LRB): VIDEO ASSISTED THORACOSCOPY (Left) DRAINAGE OF HEMOTHORAX (Left) RIB PLATING (Left) Subjective: Some pain from tubes  Objective: Vital signs in last 24 hours: Temp:  [97.8 F (36.6 C)-99.7 F (37.6 C)] 98.1 F (36.7 C) (04/12 0700) Pulse Rate:  [82-132] 91 (04/12 0800) Cardiac Rhythm: Normal sinus rhythm (04/12 0800) Resp:  [11-33] 13 (04/12 0800) BP: (76-139)/(54-79) 76/54 (04/12 0800) SpO2:  [88 %-100 %] 93 % (04/12 0800) Arterial Line BP: (97)/(65) 97/65 (04/11 2008) Weight:  [201 lb 11.5 oz (91.5 kg)] 201 lb 11.5 oz (91.5 kg) (04/11 2153)  Hemodynamic parameters for last 24 hours:    Intake/Output from previous day: 04/11 0701 - 04/12 0700 In: 3283.3 [I.V.:3233.3; IV Piggyback:50] Out: 8309 [Urine:1350; Blood:230; Chest Tube:660] Intake/Output this shift: Total I/O In: 305 [P.O.:180; I.V.:125] Out: 30 [Urine:30]  General appearance: alert, cooperative and no distress Neurologic: intact Heart: regular rate and rhythm Lungs: diminished breath sounds bibasilar L>R and - + air leak  Lab Results:  Recent Labs  08/21/16 1233 08/22/16 1757 08/23/16 0513  WBC 17.1*  --  12.6*  HGB 13.2 10.5* 10.0*  HCT 38.2* 31.0* 28.4*  PLT 369  --  279   BMET:  Recent Labs  08/21/16 1233 08/22/16 1757 08/23/16 0513  NA 127* 130* 130*  K 5.0 4.5 4.2  CL 95*  --  99*  CO2 25  --  27  GLUCOSE 134*  --  120*  BUN 10  --  6  CREATININE 0.99  --  0.76  CALCIUM 8.4*  --  7.9*    PT/INR:  Recent Labs  08/21/16 1424  LABPROT 14.8  INR 1.16   ABG    Component Value Date/Time   PHART 7.404 08/23/2016 0400   HCO3 27.2 08/23/2016 0400   TCO2 28 08/22/2016 1757   ACIDBASEDEF 1.1 08/22/2016 1957   O2SAT 95.9 08/23/2016 0400   CBG (last 3)  No results for input(s): GLUCAP in the last 72 hours.  Assessment/Plan: S/P Procedure(s) (LRB): VIDEO ASSISTED THORACOSCOPY (Left) DRAINAGE OF HEMOTHORAX (Left) RIB PLATING (Left) -POD #  1 CV- BP relatively low but tolerating well- will give 500 ml of saline and calcium gluconate as calcium is low  Dc A line when BP improves, hold BP meds until pressure higher  RESP- IS for atelectasis  Keep both CT in and to suction today  RENAL- creatinine OK  Hyponatremia- follow, hypocalcemia- supplement  ENDO- CBG OK  Anemia secondary to ABL- follow  SCD + enoxaparin for DVT prophylaxis   LOS: 2 days    [name] Nakayama 08/23/2016

## 2016-08-23 NOTE — Anesthesia Postprocedure Evaluation (Addendum)
Anesthesia Post Note  Patient: [name]  Procedure(s) Performed: Procedure(s) (LRB): VIDEO ASSISTED THORACOSCOPY (Left) DRAINAGE OF HEMOTHORAX (Left) RIB PLATING (Left)  Patient location during evaluation: PACU Anesthesia Type: General Level of consciousness: awake, awake and alert and oriented Pain management: pain level controlled Vital Signs Assessment: post-procedure vital signs reviewed and stable Respiratory status: spontaneous breathing, nonlabored ventilation, respiratory function stable and patient connected to nasal cannula oxygen Cardiovascular status: blood pressure returned to baseline Anesthetic complications: no       Last Vitals:  Vitals:   08/22/16 2345 08/23/16 0000  BP:    Pulse: (!) 132 (!) 123  Resp: (!) 22 (!) 24  Temp:      Last Pain:  Vitals:   08/23/16 0000  TempSrc:   PainSc: 8                  Sofia Vanmeter COKER

## 2016-08-24 ENCOUNTER — Inpatient Hospital Stay (HOSPITAL_COMMUNITY): Payer: 59

## 2016-08-24 LAB — COMPREHENSIVE METABOLIC PANEL
ALK PHOS: 67 U/L (ref 38–126)
ALT: 19 U/L (ref 17–63)
AST: 19 U/L (ref 15–41)
Albumin: 2.1 g/dL — ABNORMAL LOW (ref 3.5–5.0)
Anion gap: 6 (ref 5–15)
BILIRUBIN TOTAL: 0.5 mg/dL (ref 0.3–1.2)
CALCIUM: 7.4 mg/dL — AB (ref 8.9–10.3)
CHLORIDE: 100 mmol/L — AB (ref 101–111)
CO2: 26 mmol/L (ref 22–32)
CREATININE: 0.73 mg/dL (ref 0.61–1.24)
GFR calc Af Amer: >60 mL/min (ref >60)
Glucose, Bld: 142 mg/dL — ABNORMAL HIGH (ref 65–99)
Potassium: 3.6 mmol/L (ref 3.5–5.1)
Sodium: 132 mmol/L — ABNORMAL LOW (ref 135–145)
Total Protein: 4.3 g/dL — ABNORMAL LOW (ref 6.5–8.1)

## 2016-08-24 LAB — CBC
HEMATOCRIT: 25.4 % — AB (ref 39.0–52.0)
HEMOGLOBIN: 8.7 g/dL — AB (ref 13.0–17.0)
MCH: 34.1 pg — AB (ref 26.0–34.0)
MCHC: 34.3 g/dL (ref 30.0–36.0)
MCV: 99.6 fL (ref 78.0–100.0)
PLATELETS: 264 10*3/uL (ref 150–400)
RBC: 2.55 MIL/uL — AB (ref 4.22–5.81)
RDW: 13.5 % (ref 11.5–15.5)
WBC: 10.2 10*3/uL (ref 4.0–10.5)

## 2016-08-24 MED ORDER — POLYETHYLENE GLYCOL 3350 17 G PO PACK
17.0000 g | PACK | Freq: Every day | ORAL | Status: DC
  Administered 2016-08-24 – 2016-08-28 (×5): 17 g via ORAL
  Filled 2016-08-24 (×5): qty 1

## 2016-08-24 MED ORDER — FERROUS SULFATE 325 (65 FE) MG PO TABS
325.0000 mg | ORAL_TABLET | Freq: Every day | ORAL | Status: DC
  Administered 2016-08-24 – 2016-08-30 (×7): 325 mg via ORAL
  Filled 2016-08-24 (×7): qty 1

## 2016-08-24 MED ORDER — POTASSIUM CHLORIDE CRYS ER 20 MEQ PO TBCR
40.0000 meq | EXTENDED_RELEASE_TABLET | Freq: Once | ORAL | Status: AC
  Administered 2016-08-24: 40 meq via ORAL
  Filled 2016-08-24: qty 2

## 2016-08-24 MED ORDER — KETOROLAC TROMETHAMINE 15 MG/ML IJ SOLN
15.0000 mg | Freq: Four times a day (QID) | INTRAMUSCULAR | Status: AC
  Administered 2016-08-24 – 2016-08-25 (×4): 15 mg via INTRAVENOUS
  Filled 2016-08-24 (×4): qty 1

## 2016-08-24 MED ORDER — LEVALBUTEROL HCL 0.63 MG/3ML IN NEBU: 0.6300 mg | INHALATION_SOLUTION | Freq: Three times a day (TID) | RESPIRATORY_TRACT | Status: DC | PRN

## 2016-08-24 NOTE — Progress Notes (Signed)
Trauma Service Note  Subjective: Patient doing okay.  Has an air leak on theright.  Objective: Vital signs in last 24 hours: Temp:  [98.1 F (36.7 C)-101 F (38.3 C)] 98.1 F (36.7 C) (04/13 0741) Pulse Rate:  [89-120] 97 (04/13 0741) Resp:  [11-23] 19 (04/13 0741) BP: (93-137)/(58-78) 121/77 (04/13 0741) SpO2:  [92 %-99 %] 97 % (04/13 0741) Last BM Date: 08/20/16  Intake/Output from previous day: 04/12 0701 - 04/13 0700 In: 3248 [P.O.:1200; I.V.:1938; IV Piggyback:110] Out: 4098 [Urine:1255; Chest Tube:220] Intake/Output this shift: Total I/O In: 240 [P.O.:240] Out: 650 [Urine:650]  General: No distress.  Lungs: Air leak on the left.  No wheezing.  Sats are good.  Abd: Soft and benign.  Mildly distended  Extremities: No changes  Neuro: Intact  Lab Results: CBC   Recent Labs  08/23/16 0513 08/24/16 0442  WBC 12.6* 10.2  HGB 10.0* 8.7*  HCT 28.4* 25.4*  PLT 279 264   BMET  Recent Labs  08/23/16 0513 08/24/16 0442  NA 130* 132*  K 4.2 3.6  CL 99* 100*  CO2 27 26  GLUCOSE 120* 142*  BUN 6 <5*  CREATININE 0.76 0.73  CALCIUM 7.9* 7.4*   PT/INR  Recent Labs  08/21/16 1424  LABPROT 14.8  INR 1.16   ABG  Recent Labs  08/22/16 1957 08/23/16 0400  PHART 7.366 7.404  HCO3 23.6 27.2    Studies/Results: Dg Chest Port 1 View  Result Date: 08/24/2016 CLINICAL DATA:  Left rib fractures, left chest tube. Atelectasis. 08/23/2016 EXAM: PORTABLE CHEST 1 VIEW COMPARISON:  08/23/2016 FINDINGS: Left chest tubes remain in place, unchanged. No pneumothorax. Plate and screw fixation of left rib fractures again noted. Areas of left lung atelectasis, slightly improved at the left base. Right lung is clear. Heart is borderline in size. IMPRESSION: Left chest tubes remain in place without visible pneumothorax. Improving left base atelectasis. Electronically Signed   By: Rolm Baptise M.D.   On: 08/24/2016 08:10   Dg Chest Port 1 View  Result Date:  08/23/2016 CLINICAL DATA:  Status post fixation of left sixth and seventh rib fractures, evacuation of left hemothorax and decortication 08/22/2016. EXAM: PORTABLE CHEST 1 VIEW COMPARISON:  Single-view of the chest 08/22/2016. CT chest 08/21/2016. FINDINGS: Left IJ catheter and 2 left chest tubes remain in place. Subcutaneous emphysema over the left chest is decreased. No pneumothorax is identified. Left basilar opacities likely due to atelectasis are unchanged. The right lung is clear. IMPRESSION: Negative for pneumothorax with 2 left chest tubes in place. Subcutaneous emphysema over the left chest has decreased since yesterday's study. No change in left basilar airspace opacities likely due to atelectasis. Electronically Signed   By: Inge Rise M.D.   On: 08/23/2016 07:59   Dg Chest Port 1 View  Result Date: 08/22/2016 CLINICAL DATA:  LEFT-sided hemothorax. EXAM: PORTABLE CHEST 1 VIEW COMPARISON:  CT 08/21/2016 FINDINGS: Interval plate fixation of LEFT lateral rib fractures. Interval placement of 2 LEFT chest tubes. Evacuation of the fluid at the LEFT lung base. No pneumothorax evident. Subcutaneous gas noted along the LEFT chest wall. RIGHT lung is clear. New line placed with the with tip presumably within the brachiocephalic vein. Recommend correlation with venous return. IMPRESSION: 1. Two chest tubes in place without pneumothorax. 2. Reduction pleural fluid. 3. Plate fixation of rib fractures on LEFT. 4. Placement of LEFT central venous line with tip presumably in the brachycephalic vein. Recommend correlation with venous return. These results will be called to  the ordering clinician or representative by the Radiologist Assistant, and communication documented in the PACS or zVision Dashboard. a Electronically Signed   By: Suzy Bouchard M.D.   On: 08/22/2016 21:41    Anti-infectives: Anti-infectives    Start     Dose/Rate Route Frequency Ordered Stop   08/23/16 0400  cefUROXime (ZINACEF) 1.5  g in dextrose 5 % 50 mL IVPB     1.5 g 100 mL/hr over 30 Minutes Intravenous Every 12 hours 08/22/16 2038 08/23/16 1910   08/22/16 0800  cefUROXime (ZINACEF) 1.5 g in dextrose 5 % 50 mL IVPB     1.5 g 100 mL/hr over 30 Minutes Intravenous 60 min pre-op 08/21/16 1217 08/22/16 1610      Assessment/Plan: s/p Procedure(s): VIDEO ASSISTED THORACOSCOPY DRAINAGE OF HEMOTHORAX RIB PLATING Advance diet  Keep in SDU  LOS: 3 days   Kathryne Eriksson. Dahlia Bailiff, MD, FACS [phone] Trauma Surgeon 08/24/2016

## 2016-08-24 NOTE — Progress Notes (Addendum)
      [address]       [place] [id number]             [phone]        2 Days Post-Op Procedure(s) (LRB): VIDEO ASSISTED THORACOSCOPY (Left) DRAINAGE OF HEMOTHORAX (Left) RIB PLATING (Left)  Subjective: Patient apparently had chest tightness and discomfort earlier this am. He also states he hurts "all over" and has a lot of pain at chest tube sites.  Objective: Vital signs in last 24 hours: Temp:  [98.1 F (36.7 C)-101 F (38.3 C)] 98.4 F (36.9 C) (04/13 0433) Pulse Rate:  [88-120] 98 (04/13 0433) Cardiac Rhythm: Sinus tachycardia (04/12 2341) Resp:  [11-25] 13 (04/13 0608) BP: (76-137)/(54-78) 115/69 (04/13 0433) SpO2:  [92 %-99 %] 95 % (04/13 0712)   Current Weight  08/22/16 91.5 kg (201 lb 11.5 oz)   Intake/Output from previous day: 04/12 0701 - 04/13 0700 In: 3248 [P.O.:1200; I.V.:1938; IV Piggyback:110] Out: 1975 [Urine:1255; Chest Tube:220]   Physical Exam:  Cardiovascular: RRR Pulmonary: Some expiratory wheezing and decreased at left base Abdomen: Soft, non tender, bowel sounds present. Extremities:Trace bilateral lower extremity edema. Wounds: Dressing is clean and dry.    Lab Results: CBC: Recent Labs  08/23/16 0513 08/24/16 0442  WBC 12.6* 10.2  HGB 10.0* 8.7*  HCT 28.4* 25.4*  PLT 279 264   BMET:  Recent Labs  08/23/16 0513 08/24/16 0442  NA 130* 132*  K 4.2 3.6  CL 99* 100*  CO2 27 26  GLUCOSE 120* 142*  BUN 6 <5*  CREATININE 0.76 0.73  CALCIUM 7.9* 7.4*    PT/INR:  Lab Results  Component Value Date   INR 1.16 08/21/2016   INR 0.98 10/20/2010   ABG:  INR: Will add last result for INR, ABG once components are confirmed Will add last 4 CBG results once components are confirmed  Assessment/Plan:  1. CV - SR in the 90's. On HCTZ 25 mg daily, Irbesartan 300 mg daily, and Nifedipine 90 mg daily (as taken pre op).  SBP in the low 100's. Will put parameters and may need to consider holding if BP too  labile. 2.  Pulmonary - On 2 liters via oxygen. Chest tubes with 200 cc of output last 24 hours. Chest tubes with air leak with cough. CXR appears to show no pneumothorax, atelectasis left base, and stable left lateral subcutaneous emphysema. Check CXR in am. Encourage incentive spirometer and flutter valve. Albuterol PRN 3.  Acute blood loss anemia - H and H decreased to 8.7 and 25.4. Start oral iron 4. Fever to 101 last evening. WBC decreased to 10,200. Etiology likely pulmonary related. 5. Supplement potassium 6. Remove foley 7. Decrease IVF  ZIMMERMAN,DONIELLE MPA-C 08/24/2016,7:18 AM Patient seen and examined, agree with above Keep CT in place  [place] C. [name], MD [facility] [phone]

## 2016-08-25 ENCOUNTER — Inpatient Hospital Stay (HOSPITAL_COMMUNITY): Payer: 59

## 2016-08-25 MED ORDER — BOOST / RESOURCE BREEZE PO LIQD
1.0000 | Freq: Two times a day (BID) | ORAL | Status: DC
  Administered 2016-08-25 – 2016-08-27 (×4): 1 via ORAL

## 2016-08-25 NOTE — Progress Notes (Addendum)
      [address]       [place], [address]             [phone]      3 Days Post-Op Procedure(s) (LRB): VIDEO ASSISTED THORACOSCOPY (Left) DRAINAGE OF HEMOTHORAX (Left) RIB PLATING (Left) Subjective: Pain is well controlled. He did not have issues overnight. Coughing quite a bit and states it is difficult to cough up mucous.   Objective: Vital signs in last 24 hours: Temp:  [97.6 F (36.4 C)-98.7 F (37.1 C)] 98.4 F (36.9 C) (04/14 0351) Pulse Rate:  [86-100] 86 (04/14 0351) Cardiac Rhythm: Normal sinus rhythm (04/14 0727) Resp:  [10-19] 14 (04/14 0400) BP: (93-115)/(63-79) 115/79 (04/14 0351) SpO2:  [91 %-100 %] 98 % (04/14 0802)     Intake/Output from previous day: 04/13 0701 - 04/14 0700 In: 1570 [P.O.:840; I.V.:730] Out: 1130 [Urine:1050; Chest Tube:80] Intake/Output this shift: No intake/output data recorded.  General appearance: alert, cooperative and no distress Heart: regular rate and rhythm, S1, S2 normal, no murmur, click, rub or gallop Lungs: clear to auscultation bilaterally and decreased breath sounds on left Abdomen: soft, non-tender; bowel sounds normal; no masses,  no organomegaly Extremities: extremities normal, atraumatic, no cyanosis or edema Wound: clean and dry  Lab Results:  Recent Labs  08/23/16 0513 08/24/16 0442  WBC 12.6* 10.2  HGB 10.0* 8.7*  HCT 28.4* 25.4*  PLT 279 264   BMET:  Recent Labs  08/23/16 0513 08/24/16 0442  NA 130* 132*  K 4.2 3.6  CL 99* 100*  CO2 27 26  GLUCOSE 120* 142*  BUN 6 <5*  CREATININE 0.76 0.73  CALCIUM 7.9* 7.4*    PT/INR: No results for input(s): LABPROT, INR in the last 72 hours. ABG    Component Value Date/Time   PHART 7.404 08/23/2016 0400   HCO3 27.2 08/23/2016 0400   TCO2 28 08/22/2016 1757   ACIDBASEDEF 1.1 08/22/2016 1957   O2SAT 95.9 08/23/2016 0400   CBG (last 3)  No results for input(s): GLUCAP in the last 72 hours.  Assessment/Plan: S/P Procedure(s)  (LRB): VIDEO ASSISTED THORACOSCOPY (Left) DRAINAGE OF HEMOTHORAX (Left) RIB PLATING (Left)   1. CV - SR in the 80's. On HCTZ 25 mg daily, Irbesartan 300 mg daily, and Nifedipine 90 mg daily (as taken pre op). BP better this AM.  2.  Pulmonary - On 2 liters of oxygen with good saturation. Chest tubes with 80 cc of output last 24 hours. Chest tubes with air leak with cough. + fluid wave. CXR this morning shows no pneumothorax. Encourage incentive spirometer and flutter valve. Albuterol PRN 3.  Acute blood loss anemia -Start oral iron 4. No fever overnight. Last WBC 10,200. Continue to monitor 5. Lovenox and SCDs for DVT prophylaxis  6. PCA pump for pain control  Plan: Continue chest tube due to air leak. Encouraged use of incentive spirometer and flutter valve. Continue mucinex for secretions. Follow-up CXR in the am.     LOS: 4 days    Elgie Collard 08/25/2016 Patient seen and examined. He does not have an air leak at present. Minimal drainage from CT- will dc posterior tube today. Needs to ambulate in hallway  [place]. [name], MD [facility] [phone]

## 2016-08-25 NOTE — Progress Notes (Signed)
[facility] Progress Note  3 Days Post-Op  Subjective: CC: loculated hemothorax Pain is improving. Reports sitting up almost all day yesterday. Tolerating PO, still with decreased appetite and bloating. Have some flatus, no BM. Pulling 1500 on IS  Objective: Vital signs in last 24 hours: Temp:  [97.6 F (36.4 C)-98.7 F (37.1 C)] 98.4 F (36.9 C) (04/14 0351) Pulse Rate:  [86-100] 86 (04/14 0351) Resp:  [10-19] 14 (04/14 0400) BP: (93-115)/(63-79) 115/79 (04/14 0351) SpO2:  [91 %-100 %] 98 % (04/14 0802) Last BM Date: 08/20/16  Intake/Output from previous day: 04/13 0701 - 04/14 0700 In: 1570 [P.O.:840; I.V.:730] Out: 1130 [Urine:1050; Chest Tube:80] Intake/Output this shift: No intake/output data recorded.  PE: Gen:  Alert, NAD, pleasant Card:  Regular rate and rhythm, pedal pulses 2+ bilaterally Pulm:  Non-labored, chest tube site c/d/I, Sats 90% on 2L  CT: 80 cc/24h, persistent air leak Abd: Soft, non-tender, non-distended, bowel sounds present in all 4 quadrants  Lab Results:   Recent Labs  08/23/16 0513 08/24/16 0442  WBC 12.6* 10.2  HGB 10.0* 8.7*  HCT 28.4* 25.4*  PLT 279 264   BMET  Recent Labs  08/23/16 0513 08/24/16 0442  NA 130* 132*  K 4.2 3.6  CL 99* 100*  CO2 27 26  GLUCOSE 120* 142*  BUN 6 <5*  CREATININE 0.76 0.73  CALCIUM 7.9* 7.4*   PT/INR No results for input(s): LABPROT, INR in the last 72 hours. CMP     Component Value Date/Time   NA 132 (L) 08/24/2016 0442   NA 136 03/07/2016 0944   K 3.6 08/24/2016 0442   CL 100 (L) 08/24/2016 0442   CO2 26 08/24/2016 0442   GLUCOSE 142 (H) 08/24/2016 0442   BUN <5 (L) 08/24/2016 0442   BUN 7 03/07/2016 0944   CREATININE 0.73 08/24/2016 0442   CREATININE 0.96 08/06/2012 1619   CALCIUM 7.4 (L) 08/24/2016 0442   PROT 4.3 (L) 08/24/2016 0442   PROT 7.4 03/07/2016 0944   ALBUMIN 2.1 (L) 08/24/2016 0442   ALBUMIN 4.6 03/07/2016 0944   AST 19 08/24/2016 0442   ALT 19  08/24/2016 0442   ALKPHOS 67 08/24/2016 0442   BILITOT 0.5 08/24/2016 0442   BILITOT 0.8 03/07/2016 0944   GFRNONAA >60 08/24/2016 0442   GFRAA >60 08/24/2016 0442   Lipase     Component Value Date/Time   LIPASE 37 06/02/2015 0935   Studies/Results: Dg Chest Port 1 View  Result Date: 08/25/2016 CLINICAL DATA:  Pneumothorax .short of breath EXAM: PORTABLE CHEST 1 VIEW COMPARISON:  Radiograph 08/24/2016 FINDINGS: Two LEFT chest tubes remain. No pneumothorax identified. Plate fixation of LEFT lateral ribs. RIGHT lung is clear.  Central venous line unchanged. IMPRESSION: 1. No interval change. 2. Two LEFT chest tubes in place without pneumothorax. Electronically Signed   By: Suzy Bouchard M.D.   On: 08/25/2016 08:00   Dg Chest Port 1 View  Result Date: 08/24/2016 CLINICAL DATA:  Left rib fractures, left chest tube. Atelectasis. 08/23/2016 EXAM: PORTABLE CHEST 1 VIEW COMPARISON:  08/23/2016 FINDINGS: Left chest tubes remain in place, unchanged. No pneumothorax. Plate and screw fixation of left rib fractures again noted. Areas of left lung atelectasis, slightly improved at the left base. Right lung is clear. Heart is borderline in size. IMPRESSION: Left chest tubes remain in place without visible pneumothorax. Improving left base atelectasis. Electronically Signed   By: Rolm Baptise M.D.   On: 08/24/2016 08:10    Anti-infectives: Anti-infectives  Start     Dose/Rate Route Frequency Ordered Stop   08/23/16 0400  cefUROXime (ZINACEF) 1.5 g in dextrose 5 % 50 mL IVPB     1.5 g 100 mL/hr over 30 Minutes Intravenous Every 12 hours 08/22/16 2038 08/23/16 1910   08/22/16 0800  cefUROXime (ZINACEF) 1.5 g in dextrose 5 % 50 mL IVPB     1.5 g 100 mL/hr over 30 Minutes Intravenous 60 min pre-op 08/21/16 1217 08/22/16 1610     Assessment/Plan Ground level fall 3/25 - admitted 3/25 through 3/29 Large left pleural effusion/hemothorax- readmitted 4/10; S/P VATS/rib plating 4/11 ([facility]), No  PTX on CXR, still has air leak w/ cough, CTs per TCTS, pulm toilet ABL anemia - hgb 8.7, on iron, repeat CBC in AM COPD - dulera BID and duonebs TID and PRN; encourage IS HTN - on home meds with parameters  Hypothyroidism - synthroid Tobacco abuse FEN - fulls only as no appetite, add boost  VTE - SCDs, lovenox Dispo - SDU    LOS: 4 days    Jill Alexanders , [facility] 08/25/2016, 8:25 AM Pager: [phone] Consults: [phone] Mon-Fri 7:00 am-4:30 pm Sat-Sun 7:00 am-11:30 am

## 2016-08-26 ENCOUNTER — Inpatient Hospital Stay (HOSPITAL_COMMUNITY): Payer: 59

## 2016-08-26 LAB — CBC
HEMATOCRIT: 27.1 % — AB (ref 39.0–52.0)
Hemoglobin: 9.4 g/dL — ABNORMAL LOW (ref 13.0–17.0)
MCH: 33.8 pg (ref 26.0–34.0)
MCHC: 34.7 g/dL (ref 30.0–36.0)
MCV: 97.5 fL (ref 78.0–100.0)
Platelets: 278 10*3/uL (ref 150–400)
RBC: 2.78 MIL/uL — AB (ref 4.22–5.81)
RDW: 13.2 % (ref 11.5–15.5)
WBC: 9.6 10*3/uL (ref 4.0–10.5)

## 2016-08-26 NOTE — Progress Notes (Signed)
[facility] Progress Note  4 Days Post-Op  Subjective: CC: dyspnea on exertion Pt report pain is controlled. DOE yesterday when ambulating halls. Had a BM yesterday. Denies nausea/vomiting. Requesting diet be advanced.   Today pt also complains of a "knot" on his right upper back that has been present for a while. States it started out as a non-tender mass the size of a peanut but has grown larger and become tender to palpation.  Objective: Vital signs in last 24 hours: Temp:  [97.6 F (36.4 C)-98.6 F (37 C)] 98.4 F (36.9 C) (04/15 0732) Pulse Rate:  [86-103] 86 (04/15 0732) Resp:  [14-19] 14 (04/15 0732) BP: (101-120)/(61-74) 111/73 (04/15 0732) SpO2:  [93 %-96 %] 93 % (04/15 0816) Last BM Date: 08/20/16  Intake/Output from previous day: 04/14 0701 - 04/15 0700 In: 1240 [P.O.:960; I.V.:280] Out: 1076 [Urine:975; Stool:1; Chest Tube:100] Intake/Output this shift: No intake/output data recorded.  PE: Gen:  Alert, NAD, pleasant Card:  Regular rate and rhythm, pedal pulses 2+ bilaterally Pulm:  Non-labored, mild wheezes in RLL, chest tube site c/d/I, Sats 90% on 2L             CT: 80 cc/24h, persistent air leak, now returned to suction  Abd: Soft, non-tender, non-distended, bowel sounds present in all 4 quadrants Back: soft, mobile, mildly tender 2-3 cm mass on right upper back. No overlying erythema or fluctuance. Suspect lipoma.   Lab Results:   Recent Labs  08/24/16 0442 08/26/16 0515  WBC 10.2 9.6  HGB 8.7* 9.4*  HCT 25.4* 27.1*  PLT 264 278   BMET  Recent Labs  08/24/16 0442  NA 132*  K 3.6  CL 100*  CO2 26  GLUCOSE 142*  BUN <5*  CREATININE 0.73  CALCIUM 7.4*   CMP     Component Value Date/Time   NA 132 (L) 08/24/2016 0442   NA 136 03/07/2016 0944   K 3.6 08/24/2016 0442   CL 100 (L) 08/24/2016 0442   CO2 26 08/24/2016 0442   GLUCOSE 142 (H) 08/24/2016 0442   BUN <5 (L) 08/24/2016 0442   BUN 7 03/07/2016 0944   CREATININE 0.73  08/24/2016 0442   CREATININE 0.96 08/06/2012 1619   CALCIUM 7.4 (L) 08/24/2016 0442   PROT 4.3 (L) 08/24/2016 0442   PROT 7.4 03/07/2016 0944   ALBUMIN 2.1 (L) 08/24/2016 0442   ALBUMIN 4.6 03/07/2016 0944   AST 19 08/24/2016 0442   ALT 19 08/24/2016 0442   ALKPHOS 67 08/24/2016 0442   BILITOT 0.5 08/24/2016 0442   BILITOT 0.8 03/07/2016 0944   GFRNONAA >60 08/24/2016 0442   GFRAA >60 08/24/2016 0442   Lipase     Component Value Date/Time   LIPASE 37 06/02/2015 0935   Studies/Results: Dg Chest Port 1 View  Result Date: 08/26/2016 CLINICAL DATA:  Postop of air leak. Removal of 1 left-sided chest tube yesterday. EXAM: PORTABLE CHEST 1 VIEW COMPARISON:  One-view chest x-ray 08/25/2016 FINDINGS: A left basilar pleural air collection has increased in size following removal of 1 of the 2 large bore chest tubes. The remaining chest tube is stable in position. Left basilar atelectasis is noted. A left IJ line is stable. ORIF of the left rib fractures is again noted. Minimal atelectasis is present at the right lung. IMPRESSION: 1. Accumulating pleural air collection at the left base following removal of 1 of the 2 left-sided chest tubes. 2. The second chest tube is stable in position. 3. Minimal atelectasis at  the right base. These results were called by telephone at the time of interpretation on 08/26/2016 at 8:16 am to the 4E charge Nurse, Austin Miles who verbally acknowledged these results. Electronically Signed   By: San Morelle M.D.   On: 08/26/2016 08:19   Dg Chest Port 1 View  Result Date: 08/25/2016 CLINICAL DATA:  Pneumothorax .short of breath EXAM: PORTABLE CHEST 1 VIEW COMPARISON:  Radiograph 08/24/2016 FINDINGS: Two LEFT chest tubes remain. No pneumothorax identified. Plate fixation of LEFT lateral ribs. RIGHT lung is clear.  Central venous line unchanged. IMPRESSION: 1. No interval change. 2. Two LEFT chest tubes in place without pneumothorax. Electronically Signed   By:  Suzy Bouchard M.D.   On: 08/25/2016 08:00   Anti-infectives: Anti-infectives    Start     Dose/Rate Route Frequency Ordered Stop   08/23/16 0400  cefUROXime (ZINACEF) 1.5 g in dextrose 5 % 50 mL IVPB     1.5 g 100 mL/hr over 30 Minutes Intravenous Every 12 hours 08/22/16 2038 08/23/16 1910   08/22/16 0800  cefUROXime (ZINACEF) 1.5 g in dextrose 5 % 50 mL IVPB     1.5 g 100 mL/hr over 30 Minutes Intravenous 60 min pre-op 08/21/16 1217 08/22/16 1610     Assessment/Plan Ground level fall 3/25 - admitted 3/25 through 3/29 Large left pleural effusion/hemothorax- readmitted 4/10; S/P VATS/rib plating 4/11 ([facility]), No PTX on CXR, CTs per TCTS, pulm toilet  ABL anemia - hgb 9.4 from 8.7, on iron COPD - dulera BID and duonebs TID and PRN; encourage IS/flutter valve HTN - on home meds with parameters  Hypothyroidism - synthroid Tobacco abuse FEN - advance to heart healthy diet, boost  VTE - SCDs, lovenox Dispo - SDU  D/c central line    LOS: 5 days    Jill Alexanders , [facility] 08/26/2016, 8:37 AM Pager: [phone] Consults: [phone] Mon-Fri 7:00 am-4:30 pm Sat-Sun 7:00 am-11:30 am

## 2016-08-26 NOTE — Progress Notes (Addendum)
      [address]       [place] [address]             [phone]      4 Days Post-Op Procedure(s) (LRB): VIDEO ASSISTED THORACOSCOPY (Left) DRAINAGE OF HEMOTHORAX (Left) RIB PLATING (Left) Subjective: Feels okay this morning. He did become a little short of breath while walking last night. He is having an occasional sharp pain on the left.   Objective: Vital signs in last 24 hours: Temp:  [97.6 F (36.4 C)-98.6 F (37 C)] 98.4 F (36.9 C) (04/15 0732) Pulse Rate:  [86-103] 86 (04/15 0732) Cardiac Rhythm: Normal sinus rhythm (04/15 0723) Resp:  [14-19] 14 (04/15 0732) BP: (101-120)/(61-74) 111/73 (04/15 0732) SpO2:  [93 %-96 %] 93 % (04/15 0816)     Intake/Output from previous day: 04/14 0701 - 04/15 0700 In: 1240 [P.O.:960; I.V.:280] Out: 1076 [Urine:975; Stool:1; Chest Tube:100] Intake/Output this shift: No intake/output data recorded.  General appearance: alert, cooperative and no distress Heart: regular rate and rhythm, S1, S2 normal, no murmur, click, rub or gallop Lungs: diminished breath sounds in the left upper lobe. CTA on the right.  Abdomen: soft, non-tender; bowel sounds normal; no masses,  no organomegaly Extremities: extremities normal, atraumatic, no cyanosis or edema Wound: clean and dry  Lab Results:  Recent Labs  08/24/16 0442 08/26/16 0515  WBC 10.2 9.6  HGB 8.7* 9.4*  HCT 25.4* 27.1*  PLT 264 278   BMET:  Recent Labs  08/24/16 0442  NA 132*  K 3.6  CL 100*  CO2 26  GLUCOSE 142*  BUN <5*  CREATININE 0.73  CALCIUM 7.4*    PT/INR: No results for input(s): LABPROT, INR in the last 72 hours. ABG    Component Value Date/Time   PHART 7.404 08/23/2016 0400   HCO3 27.2 08/23/2016 0400   TCO2 28 08/22/2016 1757   ACIDBASEDEF 1.1 08/22/2016 1957   O2SAT 95.9 08/23/2016 0400   CBG (last 3)  No results for input(s): GLUCAP in the last 72 hours.  Assessment/Plan: S/P Procedure(s) (LRB): VIDEO ASSISTED  THORACOSCOPY (Left) DRAINAGE OF HEMOTHORAX (Left) RIB PLATING (Left)  1. CV - SR in the 80's. On HCTZ 25 mg daily, Irbesartan 300 mg daily, and Nifedipine 90 mg daily (as taken pre op). BP stable. 2. Pulmonary - On 2 liters of oxygen with good saturation. Chest tubes with 100 cc of output last 24 hours. Posterior chest tube removed yesterday, Anterior chest tube remains. CXR this morning shows accumulating pleural air collection at the left base. Minimal atelectasis at the right lung base. Encourage incentive spirometer and flutter valve. Albuterol PRN 3. Acute blood loss anemia -Start oral iron 4. No fever overnight. Last WBC 10,200. Continue to monitor 5. Lovenox and SCDs for DVT prophylaxis  6. PCA pump for pain control  Plan: Patient remains stable. Continue anterior chest tube to suction. Will discuss the chest xray with Dr. [name].    LOS: 5 days    Elgie Collard 08/26/2016 Patient seen and examined, agree with above Has a small basilar pneumothorax- Will leave anterior tube to water seal.  Revonda Standard. [name], MD [facility] [phone]

## 2016-08-27 ENCOUNTER — Inpatient Hospital Stay (HOSPITAL_COMMUNITY): Payer: 59

## 2016-08-27 LAB — BASIC METABOLIC PANEL
Anion gap: 8 (ref 5–15)
BUN: 6 mg/dL (ref 6–20)
CHLORIDE: 96 mmol/L — AB (ref 101–111)
CO2: 27 mmol/L (ref 22–32)
Calcium: 8.4 mg/dL — ABNORMAL LOW (ref 8.9–10.3)
Creatinine, Ser: 0.72 mg/dL (ref 0.61–1.24)
GFR calc Af Amer: >60 mL/min (ref >60)
GFR calc non Af Amer: >60 mL/min (ref >60)
Glucose, Bld: 101 mg/dL — ABNORMAL HIGH (ref 65–99)
POTASSIUM: 3.5 mmol/L (ref 3.5–5.1)
Sodium: 131 mmol/L — ABNORMAL LOW (ref 135–145)

## 2016-08-27 LAB — AEROBIC/ANAEROBIC CULTURE (SURGICAL/DEEP WOUND): CULTURE: NO GROWTH

## 2016-08-27 LAB — AEROBIC/ANAEROBIC CULTURE W GRAM STAIN (SURGICAL/DEEP WOUND)

## 2016-08-27 MED ORDER — OXYCODONE HCL 5 MG PO TABS
5.0000 mg | ORAL_TABLET | ORAL | Status: DC | PRN
  Administered 2016-08-27: 10 mg via ORAL
  Administered 2016-08-28: 5 mg via ORAL
  Administered 2016-08-29: 10 mg via ORAL
  Filled 2016-08-27: qty 2
  Filled 2016-08-27: qty 1
  Filled 2016-08-27: qty 2

## 2016-08-27 MED ORDER — FENTANYL CITRATE (PF) 100 MCG/2ML IJ SOLN
12.5000 ug | INTRAMUSCULAR | Status: AC | PRN
Start: 2016-08-27 — End: 2016-08-27

## 2016-08-27 NOTE — Evaluation (Signed)
Physical Therapy Evaluation Patient Details Name: Frank Rosales MRN: [id number] DOB: 09-13-1955 Today's Date: 08/27/2016   History of Present Illness  Pt is a 61 y.o. male admitted 08/05/16-3/39/18 after fall on log at home with 5-8 left rib fx.  readmitted 08/20/16 with increased pain and hemopneumothorax, s/p rib plating and drainage and VATS 08/22/16.  PMhx: prior rt rib fx, cOPD, HTN, hypothyroidism, anxiety, Asthma,    Clinical Impression  Patient presents with some decreased mobility due to pain and mild hypoxia with ambulation, but working on his own to improve during hospitalization with wife assist.  Today able to walk full hallway without O2 with RWand SpO2 86-91% with two standing rest breaks.  He was able to ambulate in the room without device safely without assist. Educated in fall prevention for home. Feel he is without skilled needs at this time and no follow up PT needs.     Follow Up Recommendations No PT follow up    Equipment Recommendations  None recommended by PT    Recommendations for Other Services       Precautions / Restrictions Precautions Precaution Comments: Watch O2      Mobility  Bed Mobility               General bed mobility comments: in recliner at entry  Transfers     Transfers: Sit to/from Stand Sit to Stand: Modified independent (Device/Increase time)            Ambulation/Gait Ambulation/Gait assistance: Supervision Ambulation Distance (Feet): 450 Feet Assistive device: Rolling walker (2 wheeled) Gait Pattern/deviations: WFL(Within Functional Limits)     General Gait Details: good speed and safe use of walker, in room able to leave walker and walk around bed to chair without support  Stairs            Wheelchair Mobility    Modified Rankin (Stroke Patients Only)       Balance     Sitting balance-Leahy Scale: Good       Standing balance-Leahy Scale: Good                                Pertinent Vitals/Pain Pain Score: 3  Pain Location: L chest  Pain Descriptors / Indicators: Discomfort;Sore Pain Intervention(s): Monitored during session    Home Living Family/patient expects to be discharged to:: Private residence Living Arrangements: Spouse/significant other Available Help at Discharge: Family;Available PRN/intermittently Type of Home: House Home Access: Stairs to enter Entrance Stairs-Rails: None Entrance Stairs-Number of Steps: 3 Home Layout: One level Home Equipment: Walker - 2 wheels;Shower seat Additional Comments: wife works, pt has DME from mom    Prior Function Level of Independence: Independent               Journalist, newspaper        Extremity/Trunk Assessment   Upper Extremity Assessment Upper Extremity Assessment: Overall WFL for tasks assessed    Lower Extremity Assessment Lower Extremity Assessment: Overall WFL for tasks assessed       Communication   Communication: No difficulties  Cognition Arousal/Alertness: Awake/alert   Overall Cognitive Status: Within Functional Limits for tasks assessed                                        General Comments General comments (skin integrity, edema, etc.): SpO2 on  RA with ambulation 86-91%, on 2L O2 at rest 94%    Exercises     Assessment/Plan    PT Assessment Patent does not need any further PT services  PT Problem List         PT Treatment Interventions      PT Goals (Current goals can be found in the Care Plan section)  Acute Rehab PT Goals PT Goal Formulation: All assessment and education complete, DC therapy    Frequency     Barriers to discharge        Co-evaluation               End of Session   Activity Tolerance: Patient tolerated treatment well Patient left: in chair;with call bell/phone within reach;with family/visitor present   PT Visit Diagnosis: Difficulty in walking, not elsewhere classified (R26.2);Pain    Time:  1528-1600 PT Time Calculation (min) (ACUTE ONLY): 32 min   Charges:   PT Evaluation $PT Eval Moderate Complexity: 1 Procedure PT Treatments $Gait Training: 8-22 mins   PT G CodesMagda Kiel, [name] [phone] 08/27/2016   [name] 08/27/2016, 5:11 PM

## 2016-08-27 NOTE — OR Nursing (Signed)
LATE ENTRY: Supply charge for sterile rib plate template added to supply into OR record.  Supply verified used with operative team.

## 2016-08-27 NOTE — H&P (Signed)
Report was given to RN on 6N. Pt is currently stable and doing well. Ready for transfer.

## 2016-08-27 NOTE — Progress Notes (Signed)
[facility] Progress Note  5 Days Post-Op  Subjective: CC: rib pain  Pt states pain is controlled with the PCA and the fentanyl helps with anxiety and his breathing. He is willing to try PO pain meds. The [place] tubing is rubbing behind his left ear. Pt states COPD but does not use home O2. No nausea or vomiting, fever or chills overnight. Pt is eating better. Pt states productive cough overnight.    Objective: Vital signs in last 24 hours: Temp:  [97.2 F (36.2 C)-98.5 F (36.9 C)] 97.9 F (36.6 C) (04/16 0359) Pulse Rate:  [88-106] 88 (04/16 0359) Resp:  [11-125] 125 (04/16 0808) BP: (97-130)/(70-98) 97/74 (04/16 0359) SpO2:  [92 %-98 %] 92 % (04/16 0808) Last BM Date: 08/20/16  Intake/Output from previous day: 04/15 0701 - 04/16 0700 In: 1080 [P.O.:720; I.V.:360] Out: 2030 [Urine:2000; Chest Tube:30] Intake/Output this shift: No intake/output data recorded.  PE: Gen:  Alert, NAD, pleasant, cooperative, well appearing Card:  RRR, no M/G/R heard Pulm:  CTA, no W/R/R, effort normal, CT site C/D/I, 94% on 2L,   CT 30cc in 24hr, on water seal, no air leak noted Abd: Soft, not distended, not tender Skin: no rashes noted, warm and dry  Lab Results:   Recent Labs  08/26/16 0515  WBC 9.6  HGB 9.4*  HCT 27.1*  PLT 278   BMET No results for input(s): NA, K, CL, CO2, GLUCOSE, BUN, CREATININE, CALCIUM in the last 72 hours. PT/INR No results for input(s): LABPROT, INR in the last 72 hours. CMP     Component Value Date/Time   NA 132 (L) 08/24/2016 0442   NA 136 03/07/2016 0944   K 3.6 08/24/2016 0442   CL 100 (L) 08/24/2016 0442   CO2 26 08/24/2016 0442   GLUCOSE 142 (H) 08/24/2016 0442   BUN <5 (L) 08/24/2016 0442   BUN 7 03/07/2016 0944   CREATININE 0.73 08/24/2016 0442   CREATININE 0.96 08/06/2012 1619   CALCIUM 7.4 (L) 08/24/2016 0442   PROT 4.3 (L) 08/24/2016 0442   PROT 7.4 03/07/2016 0944   ALBUMIN 2.1 (L) 08/24/2016 0442   ALBUMIN 4.6 03/07/2016  0944   AST 19 08/24/2016 0442   ALT 19 08/24/2016 0442   ALKPHOS 67 08/24/2016 0442   BILITOT 0.5 08/24/2016 0442   BILITOT 0.8 03/07/2016 0944   GFRNONAA >60 08/24/2016 0442   GFRAA >60 08/24/2016 0442   Lipase     Component Value Date/Time   LIPASE 37 06/02/2015 0935       Studies/Results: Dg Chest Port 1 View  Result Date: 08/27/2016 CLINICAL DATA:  Pneumothorax. EXAM: PORTABLE CHEST 1 VIEW COMPARISON:  08/26/2016 FINDINGS: Left jugular venous catheter has been removed. Left chest tube remains in place. Cardiomediastinal silhouette is unchanged. Left basilar pneumothorax is unchanged. Streaky left suprahilar and left basilar opacities are unchanged and likely reflect atelectasis. Small left pleural effusion and/or pleural thickening is unchanged. The right lung is clear. Old bilateral rib fractures are again noted, with fixation plates present on the left. IMPRESSION: Unchanged left basilar pneumothorax. Electronically Signed   By: Logan Bores M.D.   On: 08/27/2016 07:18   Dg Chest Port 1 View  Result Date: 08/26/2016 CLINICAL DATA:  Postop of air leak. Removal of 1 left-sided chest tube yesterday. EXAM: PORTABLE CHEST 1 VIEW COMPARISON:  One-view chest x-ray 08/25/2016 FINDINGS: A left basilar pleural air collection has increased in size following removal of 1 of the 2 large bore chest tubes. The  remaining chest tube is stable in position. Left basilar atelectasis is noted. A left IJ line is stable. ORIF of the left rib fractures is again noted. Minimal atelectasis is present at the right lung. IMPRESSION: 1. Accumulating pleural air collection at the left base following removal of 1 of the 2 left-sided chest tubes. 2. The second chest tube is stable in position. 3. Minimal atelectasis at the right base. These results were called by telephone at the time of interpretation on 08/26/2016 at 8:16 am to the 4E charge Nurse, Austin Miles who verbally acknowledged these results.  Electronically Signed   By: San Morelle M.D.   On: 08/26/2016 08:19    Anti-infectives: Anti-infectives    Start     Dose/Rate Route Frequency Ordered Stop   08/23/16 0400  cefUROXime (ZINACEF) 1.5 g in dextrose 5 % 50 mL IVPB     1.5 g 100 mL/hr over 30 Minutes Intravenous Every 12 hours 08/22/16 2038 08/23/16 1910   08/22/16 0800  cefUROXime (ZINACEF) 1.5 g in dextrose 5 % 50 mL IVPB     1.5 g 100 mL/hr over 30 Minutes Intravenous 60 min pre-op 08/21/16 1217 08/22/16 1610       Assessment/Plan Ground level fall 3/25 - admitted 3/25 through 3/29 Large left pleural effusion/hemothorax- readmitted 4/10; S/P VATS/rib plating 4/11 ([facility]), No PTX on CXR, CTs per TCTS, pulm toilet ABL anemia- hgb 9.4, on iron COPD - dulera BID and duonebs TID and PRN; encourage IS/flutter valve HTN - on home meds with parameters  Hypothyroidism - synthroid Tobacco abuse  FEN - advance to heart healthy diet, boost  VTE - SCDs, lovenox  Dispo - Chest xray shows unchanged L basilar PTX, PO pain meds, PT   LOS: 6 days    Kalman Drape , [name] 08/27/2016, 8:35 AM Pager: [phone] Consults: [phone] Mon-Fri 7:00 am-4:30 pm Sat-Sun 7:00 am-11:30 am

## 2016-08-27 NOTE — Progress Notes (Addendum)
[address]       [place] [address]             [phone]      5 Days Post-Op Procedure(s) (LRB): VIDEO ASSISTED THORACOSCOPY (Left) DRAINAGE OF HEMOTHORAX (Left) RIB PLATING (Left) Subjective: Says pain is a little worse today  Objective: Vital signs in last 24 hours: Temp:  [97.2 F (36.2 C)-98.5 F (36.9 C)] 97.9 F (36.6 C) (04/16 0359) Pulse Rate:  [88-106] 88 (04/16 0359) Cardiac Rhythm: Normal sinus rhythm (04/16 0359) Resp:  [11-125] 125 (04/16 0808) BP: (97-130)/(70-98) 97/74 (04/16 0359) SpO2:  [92 %-98 %] 92 % (04/16 0808)  Hemodynamic parameters for last 24 hours:    Intake/Output from previous day: 04/15 0701 - 04/16 0700 In: 1080 [P.O.:720; I.V.:360] Out: 2030 [Urine:2000; Chest Tube:30] Intake/Output this shift: No intake/output data recorded.  General appearance: alert, cooperative and no distress Heart: regular rate and rhythm Lungs: clear to auscultation bilaterally Abdomen: mild distension, soft, nontender Extremities: + LE edema Wound: incis healing well  Lab Results:  Recent Labs  08/26/16 0515  WBC 9.6  HGB 9.4*  HCT 27.1*  PLT 278   BMET: No results for input(s): NA, K, CL, CO2, GLUCOSE, BUN, CREATININE, CALCIUM in the last 72 hours.  PT/INR: No results for input(s): LABPROT, INR in the last 72 hours. ABG    Component Value Date/Time   PHART 7.404 08/23/2016 0400   HCO3 27.2 08/23/2016 0400   TCO2 28 08/22/2016 1757   ACIDBASEDEF 1.1 08/22/2016 1957   O2SAT 95.9 08/23/2016 0400   CBG (last 3)  No results for input(s): GLUCAP in the last 72 hours.  Meds Scheduled Meds: . acetaminophen  1,000 mg Oral Q6H   Or  . acetaminophen (TYLENOL) oral liquid 160 mg/5 mL  1,000 mg Oral Q6H  . bisacodyl  10 mg Oral Daily  . enoxaparin (LOVENOX) injection  40 mg Subcutaneous Q24H  . feeding supplement  1 Container Oral BID BM  . fentaNYL   Intravenous Q4H  . ferrous sulfate  325 mg Oral Q breakfast  .  irbesartan  300 mg Oral Daily   And  . hydrochlorothiazide  25 mg Oral Daily  . levothyroxine  50 mcg Oral QAC breakfast  . mouth rinse  15 mL Mouth Rinse BID  . mometasone-formoterol  2 puff Inhalation BID  . NIFEdipine  90 mg Oral Daily  . pantoprazole  40 mg Oral Daily  . polyethylene glycol  17 g Oral Daily  . senna-docusate  1 tablet Oral QHS   Continuous Infusions: . dextrose 5 % and 0.9% NaCl 10 mL/hr at 08/26/16 2000   PRN Meds:.albuterol, diphenhydrAMINE **OR** diphenhydrAMINE, guaiFENesin, naloxone **AND** sodium chloride flush, ondansetron **OR** ondansetron (ZOFRAN) IV, oxyCODONE, potassium chloride (KCL MULTIRUN) 30 mEq in 265 mL IVPB, traMADol  Xrays Dg Chest Port 1 View  Result Date: 08/27/2016 CLINICAL DATA:  Pneumothorax. EXAM: PORTABLE CHEST 1 VIEW COMPARISON:  08/26/2016 FINDINGS: Left jugular venous catheter has been removed. Left chest tube remains in place. Cardiomediastinal silhouette is unchanged. Left basilar pneumothorax is unchanged. Streaky left suprahilar and left basilar opacities are unchanged and likely reflect atelectasis. Small left pleural effusion and/or pleural thickening is unchanged. The right lung is clear. Old bilateral rib fractures are again noted, with fixation plates present on the left. IMPRESSION: Unchanged left basilar pneumothorax. Electronically Signed   By: Logan Bores M.D.   On: 08/27/2016 07:18   Dg Chest Port 1  View  Result Date: 08/26/2016 CLINICAL DATA:  Postop of air leak. Removal of 1 left-sided chest tube yesterday. EXAM: PORTABLE CHEST 1 VIEW COMPARISON:  One-view chest x-ray 08/25/2016 FINDINGS: A left basilar pleural air collection has increased in size following removal of 1 of the 2 large bore chest tubes. The remaining chest tube is stable in position. Left basilar atelectasis is noted. A left IJ line is stable. ORIF of the left rib fractures is again noted. Minimal atelectasis is present at the right lung. IMPRESSION: 1.  Accumulating pleural air collection at the left base following removal of 1 of the 2 left-sided chest tubes. 2. The second chest tube is stable in position. 3. Minimal atelectasis at the right base. These results were called by telephone at the time of interpretation on 08/26/2016 at 8:16 am to the 4E charge Nurse, Austin Miles who verbally acknowledged these results. Electronically Signed   By: San Morelle M.D.   On: 08/26/2016 08:19    Assessment/Plan: S/P Procedure(s) (LRB): VIDEO ASSISTED THORACOSCOPY (Left) DRAINAGE OF HEMOTHORAX (Left) RIB PLATING (Left)  1 doing well overall 2 CT on H2O seal without airleak, CXR is stable in appearance- small basilar pneumothorax 3 afebrile without new CBC 4 conts PCA 5 hemodyn stable on current BP meds 6 recheck electrolytes  LOS: 6 days    GOLD,WAYNE E 08/27/2016 Patient seen and examined. Basilar pneumo unchanged. No air leak from CT- dc chest tube Dc PCA Possibly home tomorrow  Remo Lipps C. [name], MD [facility] [phone]

## 2016-08-28 ENCOUNTER — Encounter (HOSPITAL_COMMUNITY): Payer: Self-pay | Admitting: Thoracic Surgery (Cardiothoracic Vascular Surgery)

## 2016-08-28 ENCOUNTER — Inpatient Hospital Stay (HOSPITAL_COMMUNITY): Payer: 59

## 2016-08-28 MED ORDER — ZOLPIDEM TARTRATE 5 MG PO TABS
5.0000 mg | ORAL_TABLET | Freq: Every evening | ORAL | Status: DC | PRN
  Administered 2016-08-28: 5 mg via ORAL
  Filled 2016-08-28: qty 1

## 2016-08-28 MED ORDER — GUAIFENESIN ER 600 MG PO TB12: 1200.0000 mg | ORAL_TABLET | Freq: Two times a day (BID) | ORAL | Status: DC | PRN

## 2016-08-28 MED ORDER — OXYCODONE HCL 5 MG PO TABS: 5.0000 mg | ORAL_TABLET | ORAL | 0 refills | Status: DC | PRN

## 2016-08-28 MED ORDER — FERROUS SULFATE 325 (65 FE) MG PO TABS: 325.0000 mg | ORAL_TABLET | Freq: Every day | ORAL | 1 refills | Status: DC

## 2016-08-28 NOTE — Progress Notes (Addendum)
      [address]       [place] [id number]             [phone]      6 Days Post-Op Procedure(s) (LRB): VIDEO ASSISTED THORACOSCOPY (Left) DRAINAGE OF HEMOTHORAX (Left) RIB PLATING (Left)   Subjective:  Patient not doing as well today.  Didn't get much rest as he moved rooms late.  + pain at ribs, but overall states he feels this is better since plating has been done.  Objective: Vital signs in last 24 hours: Temp:  [97.6 F (36.4 C)-98.6 F (37 C)] 98.3 F (36.8 C) (04/17 0537) Pulse Rate:  [88-114] 92 (04/17 0537) Cardiac Rhythm: Normal sinus rhythm (04/17 0700) Resp:  [14-30] 16 (04/17 0537) BP: (100-118)/(62-74) 114/70 (04/17 0537) SpO2:  [93 %-99 %] 95 % (04/17 0814) Weight:  [198 lb (89.8 kg)] 198 lb (89.8 kg) (04/17 0039)  Intake/Output from previous day: 04/16 0701 - 04/17 0700 In: 1017.4 [P.O.:1010; I.V.:7.4] Out: 2460 [Urine:2450; Chest Tube:10]  General appearance: alert, cooperative and no distress Heart: regular rate and rhythm Lungs: diminished breath sounds right base Abdomen: soft, non-tender; bowel sounds normal; no masses,  no organomegaly Wound: clean and dry  Lab Results:  Recent Labs  08/26/16 0515  WBC 9.6  HGB 9.4*  HCT 27.1*  PLT 278   BMET:  Recent Labs  08/27/16 0846  NA 131*  K 3.5  CL 96*  CO2 27  GLUCOSE 101*  BUN 6  CREATININE 0.72  CALCIUM 8.4*    PT/INR: No results for input(s): LABPROT, INR in the last 72 hours. ABG    Component Value Date/Time   PHART 7.404 08/23/2016 0400   HCO3 27.2 08/23/2016 0400   TCO2 28 08/22/2016 1757   ACIDBASEDEF 1.1 08/22/2016 1957   O2SAT 95.9 08/23/2016 0400   CBG (last 3)  No results for input(s): GLUCAP in the last 72 hours.  Assessment/Plan: S/P Procedure(s) (LRB): VIDEO ASSISTED THORACOSCOPY (Left) DRAINAGE OF HEMOTHORAX (Left) RIB PLATING (Left)  1. Chest tube removed yesterday- CXR shows improvement of pneumothorax, hydropneumothorax/scarring is  stable 2. Remains afebrile, wound is healing without signs of infection    LOS: 7 days    BARRETT, ERIN 08/28/2016 Didn't sleep well last night Some pain but hasn't had any meds since lunch time yesterday CXR some fluid filling basilar space Needs to ambulate, wean O2 Hopefully home in next day or two  Remo Lipps C. [name], MD [facility] [phone]

## 2016-08-28 NOTE — Progress Notes (Signed)
Pt transfer from [address] alert and oriented with 02nc at 2l/min, right AC peripheral IV line, with dressing at the left flank dry and intact s/p removal of the chest tube, no s/s of distress noted.

## 2016-08-28 NOTE — Progress Notes (Signed)
Pt transfer to x-ray. 

## 2016-08-28 NOTE — Progress Notes (Signed)
Patient ID: Frank Rosales, male   DOB: [birth year], 61 y.o.   MRN: [id number]  [facility] Surgery Progress Note  6 Days Post-Op  Subjective: CC- rib pain Patient states that he did not sleep well last night. He was not transferred to 6[address] until midnight. States that he is very sore in his ribs today. Denies any increased SOB. Continues to cough up thick mucus.  Tolerating diet. Denies n/v.  Objective: Vital signs in last 24 hours: Temp:  [97.6 F (36.4 C)-98.6 F (37 C)] 98.3 F (36.8 C) (04/17 0537) Pulse Rate:  [88-114] 92 (04/17 0537) Resp:  [14-125] 16 (04/17 0537) BP: (100-118)/(62-74) 114/70 (04/17 0537) SpO2:  [92 %-99 %] 99 % (04/17 0537) Weight:  [198 lb (89.8 kg)] 198 lb (89.8 kg) (04/17 0039) Last BM Date: 08/26/16  Intake/Output from previous day: 04/16 0701 - 04/17 0700 In: 1017.4 [P.O.:1010; I.V.:7.4] Out: 2460 [Urine:2450; Chest Tube:10] Intake/Output this shift: No intake/output data recorded.  PE: Gen:  Alert, NAD, pleasant, cooperative Card:  RRR, no M/G/R heard Pulm:  CTAB, no W/R/R, effort normal, previous CT site C/D/I, 94% on 2L Abd: Soft, not distended, not tender, +BS, no hernia Skin: possible lipoma proximal aspect of back just medial to right scapula  Lab Results:   Recent Labs  08/26/16 0515  WBC 9.6  HGB 9.4*  HCT 27.1*  PLT 278   BMET  Recent Labs  08/27/16 0846  NA 131*  K 3.5  CL 96*  CO2 27  GLUCOSE 101*  BUN 6  CREATININE 0.72  CALCIUM 8.4*   PT/INR No results for input(s): LABPROT, INR in the last 72 hours. CMP     Component Value Date/Time   NA 131 (L) 08/27/2016 0846   NA 136 03/07/2016 0944   K 3.5 08/27/2016 0846   CL 96 (L) 08/27/2016 0846   CO2 27 08/27/2016 0846   GLUCOSE 101 (H) 08/27/2016 0846   BUN 6 08/27/2016 0846   BUN 7 03/07/2016 0944   CREATININE 0.72 08/27/2016 0846   CREATININE 0.96 08/06/2012 1619   CALCIUM 8.4 (L) 08/27/2016 0846   PROT 4.3 (L) 08/24/2016 0442   PROT 7.4 03/07/2016  0944   ALBUMIN 2.1 (L) 08/24/2016 0442   ALBUMIN 4.6 03/07/2016 0944   AST 19 08/24/2016 0442   ALT 19 08/24/2016 0442   ALKPHOS 67 08/24/2016 0442   BILITOT 0.5 08/24/2016 0442   BILITOT 0.8 03/07/2016 0944   GFRNONAA >60 08/27/2016 0846   GFRAA >60 08/27/2016 0846   Lipase     Component Value Date/Time   LIPASE 37 06/02/2015 0935       Studies/Results: Dg Chest 1v Repeat Same Day  Result Date: 08/27/2016 CLINICAL DATA:  Left-sided chest tube removal EXAM: CHEST - 1 VIEW SAME DAY COMPARISON:  08/27/2016 FINDINGS: Interval removal of the left-sided chest tube. Persistent left basilar low loculated pneumothorax. Left basilar airspace disease likely reflecting atelectasis. No other focal parenchymal opacity. No right pneumothorax or pleural effusion. Stable cardiomediastinal silhouette. No acute osseous abnormality. IMPRESSION: 1. Interval removal of the left-sided chest tube. Persistent left basilar low loculated pneumothorax. Electronically Signed   By: Kathreen Devoid   On: 08/27/2016 13:08   Dg Chest Port 1 View  Result Date: 08/27/2016 CLINICAL DATA:  Pneumothorax. EXAM: PORTABLE CHEST 1 VIEW COMPARISON:  08/26/2016 FINDINGS: Left jugular venous catheter has been removed. Left chest tube remains in place. Cardiomediastinal silhouette is unchanged. Left basilar pneumothorax is unchanged. Streaky left suprahilar and left basilar  opacities are unchanged and likely reflect atelectasis. Small left pleural effusion and/or pleural thickening is unchanged. The right lung is clear. Old bilateral rib fractures are again noted, with fixation plates present on the left. IMPRESSION: Unchanged left basilar pneumothorax. Electronically Signed   By: Logan Bores M.D.   On: 08/27/2016 07:18    Anti-infectives: Anti-infectives    Start     Dose/Rate Route Frequency Ordered Stop   08/23/16 0400  cefUROXime (ZINACEF) 1.5 g in dextrose 5 % 50 mL IVPB     1.5 g 100 mL/hr over 30 Minutes Intravenous  Every 12 hours 08/22/16 2038 08/23/16 1910   08/22/16 0800  cefUROXime (ZINACEF) 1.5 g in dextrose 5 % 50 mL IVPB     1.5 g 100 mL/hr over 30 Minutes Intravenous 60 min pre-op 08/21/16 1217 08/22/16 1610       Assessment/Plan Ground level fall 3/25 - admitted 3/25 through 3/29 Large left pleural effusion/hemothorax- readmitted 4/10; S/P VATS/rib plating 4/11 [name]), CXR stable, CTs out per TCTS, pulm toilet ABL anemia- hgb 9.4 (4/15), on iron COPD - dulera BID and duonebs TID and PRN; encourage IS/flutter valve HTN - on home meds with parameters  Hypothyroidism - synthroid Tobacco abuse  FEN - heart healthy diet, boost  VTE - SCDs, lovenox  Dispo - Chest xray shows stable loculated hydropneumothorax at the left lung base. Encourage ambulation/IS today. Will await CT surgery recommendations. Hopefully home this afternoon. rx on chart. Can follow-up outpatient with general surgery to discuss possible lipoma on back.   LOS: 7 days    Jerrye Beavers , [facility] Surgery 08/28/2016, 7:14 AM Pager: [phone] Consults: [phone] Mon-Fri 7:00 am-4:30 pm Sat-Sun 7:00 am-11:30 am

## 2016-08-29 ENCOUNTER — Inpatient Hospital Stay (HOSPITAL_COMMUNITY): Payer: 59

## 2016-08-29 MED ORDER — ALBUTEROL SULFATE (2.5 MG/3ML) 0.083% IN NEBU
3.0000 mL | INHALATION_SOLUTION | Freq: Four times a day (QID) | RESPIRATORY_TRACT | Status: AC
  Administered 2016-08-29 (×3): 3 mL via RESPIRATORY_TRACT
  Filled 2016-08-29 (×3): qty 3

## 2016-08-29 NOTE — Progress Notes (Signed)
Patient ID: Frank Rosales, male   DOB: 12/08/1955, 61 y.o.   MRN: [id number]  [facility] Surgery Progress Note  7 Days Post-Op  Subjective: CC- rib pain Patient states that he ambulated well yesterday. He has been using IS/flutter valve and coughing mucus up. States that O2 sats dropped into upper 80's last night and he had to be placed back on [place] O2. Received a breathing treatment this morning. States that his chest does feel tight, and without O2 feels a little SOB.  Objective: Vital signs in last 24 hours: Temp:  [97.5 F (36.4 C)-98.9 F (37.2 C)] 98.9 F (37.2 C) (04/18 0640) Pulse Rate:  [91-96] 95 (04/18 0640) Resp:  [17-18] 18 (04/18 0640) BP: (90-134)/(67-70) 115/68 (04/18 0640) SpO2:  [92 %-95 %] 94 % (04/18 0753) Last BM Date: 08/28/16  Intake/Output from previous day: 04/17 0701 - 04/18 0700 In: 1140 [P.O.:1140] Out: 475 [Urine:475] Intake/Output this shift: No intake/output data recorded.  PE: Gen: Alert, NAD, pleasant, cooperative Card: RRR, no M/G/R heard Pulm: mild bilateral wheezing, effort normal, previous CT site C/D/I, 94% on 2L Abd: Soft, not distended, not tender, +BS, no hernia  Lab Results:  No results for input(s): WBC, HGB, HCT, PLT in the last 72 hours. BMET  Recent Labs  08/27/16 0846  NA 131*  K 3.5  CL 96*  CO2 27  GLUCOSE 101*  BUN 6  CREATININE 0.72  CALCIUM 8.4*   PT/INR No results for input(s): LABPROT, INR in the last 72 hours. CMP     Component Value Date/Time   NA 131 (L) 08/27/2016 0846   NA 136 03/07/2016 0944   K 3.5 08/27/2016 0846   CL 96 (L) 08/27/2016 0846   CO2 27 08/27/2016 0846   GLUCOSE 101 (H) 08/27/2016 0846   BUN 6 08/27/2016 0846   BUN 7 03/07/2016 0944   CREATININE 0.72 08/27/2016 0846   CREATININE 0.96 08/06/2012 1619   CALCIUM 8.4 (L) 08/27/2016 0846   PROT 4.3 (L) 08/24/2016 0442   PROT 7.4 03/07/2016 0944   ALBUMIN 2.1 (L) 08/24/2016 0442   ALBUMIN 4.6 03/07/2016 0944   AST 19  08/24/2016 0442   ALT 19 08/24/2016 0442   ALKPHOS 67 08/24/2016 0442   BILITOT 0.5 08/24/2016 0442   BILITOT 0.8 03/07/2016 0944   GFRNONAA >60 08/27/2016 0846   GFRAA >60 08/27/2016 0846   Lipase     Component Value Date/Time   LIPASE 37 06/02/2015 0935       Studies/Results: Dg Chest 2 View  Result Date: 08/28/2016 CLINICAL DATA:  61 year old male status post VATS for drainage of posttraumatic left hemothorax and plating of multiple rib fractures postoperative day 6. EXAM: CHEST  2 VIEW COMPARISON:  08/27/2016 and earlier. FINDINGS: Loculated hydropneumothorax continues at the left lung base with an air-fluid level, but decreased non dependent and peripheral pneumothorax since yesterday. Continued left lateral pleural thickening in the region of the left rib ORIF. Continued left suprahilar streaky opacity. Stable right lung ventilation. No right pneumothorax that no right pleural effusion. Stable cardiac size and mediastinal contours. Stable and negative visible bowel gas pattern. Previous L1 compression fracture augmentation. IMPRESSION: 1. Loculated hydropneumothorax at the left lung base remain stable. Decreased non dependent left pneumothorax since yesterday. 2. Postoperative changes and patchy opacity also in the left mid and upper lung. 3. No new cardiopulmonary abnormality. Electronically Signed   By: Genevie Ann M.D.   On: 08/28/2016 07:23   Dg Chest 1v Repeat Same  Day  Result Date: 08/27/2016 CLINICAL DATA:  Left-sided chest tube removal EXAM: CHEST - 1 VIEW SAME DAY COMPARISON:  08/27/2016 FINDINGS: Interval removal of the left-sided chest tube. Persistent left basilar low loculated pneumothorax. Left basilar airspace disease likely reflecting atelectasis. No other focal parenchymal opacity. No right pneumothorax or pleural effusion. Stable cardiomediastinal silhouette. No acute osseous abnormality. IMPRESSION: 1. Interval removal of the left-sided chest tube. Persistent left basilar  low loculated pneumothorax. Electronically Signed   By: Kathreen Devoid   On: 08/27/2016 13:08    Anti-infectives: Anti-infectives    Start     Dose/Rate Route Frequency Ordered Stop   08/23/16 0400  cefUROXime (ZINACEF) 1.5 g in dextrose 5 % 50 mL IVPB     1.5 g 100 mL/hr over 30 Minutes Intravenous Every 12 hours 08/22/16 2038 08/23/16 1910   08/22/16 0800  cefUROXime (ZINACEF) 1.5 g in dextrose 5 % 50 mL IVPB     1.5 g 100 mL/hr over 30 Minutes Intravenous 60 min pre-op 08/21/16 1217 08/22/16 1610       Assessment/Plan Ground level fall 3/25 - admitted 3/25 through 3/29 Large left pleural effusion/hemothorax- readmitted 4/10; S/P VATS/rib plating 4/11 [name]), CXR stable, CTs out per TCTS, pulm toilet ABL anemia- hgb 9.4 (4/15), on iron COPD - dulera BID and duonebs TID and PRN; encourage IS/flutter valve HTN - on home meds with parameters  Hypothyroidism - synthroid Tobacco abuse  FEN - heart healthy diet, boost  VTE - SCDs, lovenox  Dispo - Repeat CXR due to O2 sats dropping/wheezing. Continue IS/flutter valve and ambulation today.  Will await CT surgery recommendations. Hopefully home this afternoon. rx on chart.   LOS: 8 days    Jerrye Beavers , [facility] Surgery 08/29/2016, 8:42 AM Pager: [phone] Consults: [phone] Mon-Fri 7:00 am-4:30 pm Sat-Sun 7:00 am-11:30 am

## 2016-08-29 NOTE — Progress Notes (Signed)
7 Days Post-Op Procedure(s) (LRB): VIDEO ASSISTED THORACOSCOPY (Left) DRAINAGE OF HEMOTHORAX (Left) RIB PLATING (Left) Subjective: Concerned that O2 sat dropped and back on O2 Says he couldn't use IS and flutter much yesterday  Objective: Vital signs in last 24 hours: Temp:  [97.5 F (36.4 C)-98.9 F (37.2 C)] 98.9 F (37.2 C) (04/18 0640) Pulse Rate:  [91-96] 95 (04/18 0640) Resp:  [17-18] 18 (04/18 0640) BP: (90-134)/(67-70) 115/68 (04/18 0640) SpO2:  [92 %-95 %] 94 % (04/18 0753)  Hemodynamic parameters for last 24 hours:    Intake/Output from previous day: 04/17 0701 - 04/18 0700 In: 1140 [P.O.:1140] Out: 475 [Urine:475] Intake/Output this shift: No intake/output data recorded.  General appearance: alert Neurologic: intact Heart: regular rate and rhythm Lungs: wheezes bilaterally Wound: clean and dry  Lab Results: No results for input(s): WBC, HGB, HCT, PLT in the last 72 hours. BMET:  Recent Labs  08/27/16 0846  NA 131*  K 3.5  CL 96*  CO2 27  GLUCOSE 101*  BUN 6  CREATININE 0.72  CALCIUM 8.4*    PT/INR: No results for input(s): LABPROT, INR in the last 72 hours. ABG    Component Value Date/Time   PHART 7.404 08/23/2016 0400   HCO3 27.2 08/23/2016 0400   TCO2 28 08/22/2016 1757   ACIDBASEDEF 1.1 08/22/2016 1957   O2SAT 95.9 08/23/2016 0400   CBG (last 3)  No results for input(s): GLUCAP in the last 72 hours.  Assessment/Plan: S/P Procedure(s) (LRB): VIDEO ASSISTED THORACOSCOPY (Left) DRAINAGE OF HEMOTHORAX (Left) RIB PLATING (Left) -He has some wheezing this AM. On Dulera, not sure if he has been getting albuterol nebs- will change to inhaler and give QID today then change to PRN - continue IS and flutter - continue ambulation - Check CXR- ordered but hasn't been done yet   LOS: 8 days    [name] Nakayama 08/29/2016

## 2016-08-29 NOTE — Clinical Social Work Note (Signed)
Clinical Social Work Assessment  Patient Details  Name: Frank Rosales MRN: [id number] Date of Birth: 1955-07-17  Date of referral:  08/29/16               Reason for consult:  Trauma                Permission sought to share information with:    Permission granted to share information::     Name::        Agency::     Relationship::     Contact Information:     Housing/Transportation Living arrangements for the past 2 months:    Source of Information:  Patient Patient Interpreter Needed:  None Criminal Activity/Legal Involvement Pertinent to Current Situation/Hospitalization:  No - Comment as needed Significant Relationships:    Lives with:    Do you feel safe going back to the place where you live?    Need for family participation in patient care:     Care giving concerns:  No care giving concerns at this time.   Social Worker assessment / plan:  CSW met with patient today to complete SBIRT screening as he is on Trauma. Pt alert and Ox4. CSW explored patient's readiness for change and motivation to cut back on alcohol use.  Pt declined resources at this time..  Employment status:    Insurance information:    PT Recommendations:  No Follow Up Information / Referral to community resources:  SBIRT  Patient/Family's Response to care:  Patient declined intervention at this time. Patient reports no issues at this time.  Patient/Family's Understanding of and Emotional Response to Diagnosis, Current Treatment, and Prognosis:  Patient understanding purpose of brief intervention for alcohol. Patient agreeable during assessment. Patient reports no issues or concerns at this time.  Emotional Assessment Appearance:  Appears stated age Attitude/Demeanor/Rapport:   (Cooperative) Affect (typically observed):  Accepting, Appropriate, Calm Orientation:  Oriented to Self, Oriented to Place, Oriented to  Time, Oriented to Situation Alcohol / Substance use:  Alcohol Use Psych involvement  (Current and /or in the community):  No (Comment)  Discharge Needs  Concerns to be addressed:  Substance Abuse Concerns Readmission within the last 30 days:  Yes Current discharge risk:  Substance Abuse Barriers to Discharge:  Continued Medical Work up   [employer], LCSW 08/29/2016, 2:21 PM

## 2016-08-30 MED ORDER — ALBUTEROL SULFATE HFA 108 (90 BASE) MCG/ACT IN AERS: 2.0000 | INHALATION_SPRAY | Freq: Four times a day (QID) | RESPIRATORY_TRACT | 2 refills | Status: DC

## 2016-08-30 NOTE — Discharge Summary (Signed)
[facility] Discharge Summary   Patient ID: Frank Rosales MRN: [id number] DOB/AGE: 59-May-1957 59 y.o.  Admit date: 08/20/2016 Discharge date: 08/30/2016  Admitting Diagnosis: Left hemothorax Atelectasis  Discharge Diagnosis Patient Active Problem List   Diagnosis Date Noted  . Hemothorax on left 08/21/2016  . Rib fractures 08/05/2016  . Asbestosis ([place]) 02/16/2016  . Sleep disturbance 07/06/2015  . Alcohol abuse 10/19/2014  . GAD (generalized anxiety disorder) 07/01/2014  . Hypothyroidism 12/03/2013  . Osteopenia 03/11/2013  . Cigarette smoker 07/12/2011  . Essential hypertension   . COPD GOLD III    . High cholesterol   . Hilar adenopathy     Consultants Modesto Charon MD - cardiothoracic surgery  Imaging: Dg Chest 2 View  Result Date: 08/29/2016 CLINICAL DATA:  Several days of wheezing. Left lung surgery for drainage of a hemothorax 1 week ago EXAM: CHEST  2 VIEW COMPARISON:  Chest x-ray of August 28, 2016 FINDINGS: The right lung is mildly hyperinflated and clear. On the left there is mild stable volume loss. There is a persistent loculated left-sided hydropneumothorax. No apical pneumothorax is evident. There is stable density in the left suprahilar region extending into the apex. There are known left lateral rib fractures. The heart and pulmonary vascularity are normal. The mediastinum is normal in width. IMPRESSION: Stable appearance of the loculated left basilar hydropneumothorax. No significant change in the appearance of the chest since yesterday's study. Electronically Signed   By: David  Martinique M.D.   On: 08/29/2016 10:03   DG chest 2 view 08/28/16: 1. Loculated hydropneumothorax at the left lung base remain stable. Decreased non dependent left pneumothorax since yesterday. 2. Postoperative changes and patchy opacity also in the left mid and upper lung. 3. No new cardiopulmonary abnormality.  DG chest 1V repeat same day 08/27/16: 1. Interval  removal of the left-sided chest tube. Persistent left basilar low loculated pneumothorax.  CT angio chest PE w or wo contrast 08/21/16: 1. Significant interval increase and moderate to large loculated right-sided pleural effusion. New areas of increased attenuation within the effusion, suspicious for interval hemorrhage into the left hemithorax since the prior study. Underlying atelectasis noted. Infection cannot be entirely excluded, though there is no evidence of empyema at this time. 2. Fractures of the left lateral fifth through eighth ribs again noted, with  comminution at the left sixth and seventh ribs. Associated soft tissue hemorrhage along the left chest wall, with residual soft tissue air along the left chest wall. 3. Mild right basilar atelectasis. Scattered peripheral blebs at the lung apices. 4. 1.5 cm azygoesophageal recess node noted. 5. Chronic compression deformities of vertebral bodies T12 and L1, with changes of vertebroplasty at L1.  Procedures Dr. [name] (08/22/16) - Left thoracotomy, evacuation of hemothorax and decortication, plating of 6th and 7th ribs  Hospital Course:  Frank LUHMANN is a 61yo male PMH COPD who returned to [facility] 08/20/16 after a recent admission for ground level fall on 08/05/16 where he sustained multiple left-sided rib fractures. Discharged 08/09/16 in good condition.  Returned to ED 4/9 complaining of acute worsening left sided chest pain. Workup showed a large left pleural effusion/hemothorax.  Patient was admitted for pain control and further workup/treatment. Cardiothoracic surgery consulted and recommended VATS/thoracotomy for loculated hemothorax.  Patient underwent this procedure on 4/11. Tolerated procedure well and was transferred to the ICU for monitoring. Patient was instructed to use incentive spirometer and flutter valve regularly, and he was on dulera BID and duonebs TID. Diet was  advanced as tolerated.  CXR on 4/14 was stable and showed no  pneumothorax, therefore the posterior chest tube was removed. CXR on 4/16 was unchanged and remaining chest tube was removed. He did have increased wheezing on 4/18, but this responded well to increasing inhaler use to QID. On 4/19 his CXR remained stable, O2 sats stable on room air, the patient was voiding well, tolerating diet, ambulating well, pain well controlled, vital signs stable, incisions c/d/i and felt stable for discharge home.  Patient will follow up with Dr. [name] as below, and knows to call with questions or concerns.    I have personally reviewed the patients medication history on the [place] controlled substance database.   Physical Exam: Gen: Alert, NAD, pleasant, cooperative Card: RRR, no M/G/R heard Pulm: CTAB, effort normal, previous CT site C/D/I, 92% on RA Abd: Soft, not distended, not tender, +BS, no hernia  Allergies as of 08/30/2016      Reactions   No Known Allergies       Medication List    STOP taking these medications   traMADol 50 MG tablet Commonly known as:  ULTRAM     TAKE these medications   acetaminophen 325 MG tablet Commonly known as:  TYLENOL Take 2 tablets (650 mg total) by mouth every 6 (six) hours as needed.   ADVIL PM 200-25 MG Caps Generic drug:  Ibuprofen-Diphenhydramine HCl Take 1 capsule by mouth at bedtime as needed (for sleep).   albuterol 108 (90 Base) MCG/ACT inhaler Commonly known as:  PROAIR HFA Inhale 2 puffs into the lungs every 6 (six) hours. What changed:  when to take this  reasons to take this   aspirin 81 MG tablet Take 81 mg by mouth daily.   budesonide-formoterol 160-4.5 MCG/ACT inhaler Commonly known as:  SYMBICORT Inhale 2 puffs into the lungs 2 (two) times daily.   budesonide-formoterol 160-4.5 MCG/ACT inhaler Commonly known as:  SYMBICORT 2 puffs bid   cholecalciferol 1000 units tablet Commonly known as:  VITAMIN D Take 1,000 Units by mouth daily.   ferrous sulfate 325 (65 FE) MG tablet Take 1  tablet (325 mg total) by mouth daily with breakfast.   folic acid 1 MG tablet Commonly known as:  FOLVITE Take 1 tablet (1 mg total) by mouth daily.   guaiFENesin 600 MG 12 hr tablet Commonly known as:  MUCINEX Take 2 tablets (1,200 mg total) by mouth 2 (two) times daily as needed for cough or to loosen phlegm.   ibuprofen 400 MG tablet Commonly known as:  ADVIL,MOTRIN Take 1 tablet (400 mg total) by mouth every 4 (four) hours as needed.   KRILL OIL PO Take 1 Can by mouth daily.   levothyroxine 50 MCG tablet Commonly known as:  SYNTHROID, LEVOTHROID Take 1 tablet (50 mcg total) by mouth daily before breakfast. Wait 1 hour after taking before eating or drinking anything but water.   methocarbamol 500 MG tablet Commonly known as:  ROBAXIN Take 1 tablet (500 mg total) by mouth every 6 (six) hours as needed for muscle spasms.   NIFEdipine 90 MG 24 hr tablet Commonly known as:  PROCARDIA XL/ADALAT-CC TAKE 1 TABLET (90 MG TOTAL) BY MOUTH DAILY.   oxyCODONE 5 MG immediate release tablet Commonly known as:  Oxy IR/ROXICODONE Take 1-2 tablets (5-10 mg total) by mouth every 4 (four) hours as needed (5mg  for moderate pain, 10mg  for severe pain).   valsartan-hydrochlorothiazide 320-25 MG tablet Commonly known as:  DIOVAN-HCT TAKE 1 TABLET BY MOUTH  DAILY.   vitamin B-12 250 MCG tablet Commonly known as:  CYANOCOBALAMIN Take 250 mcg by mouth daily.        Follow-up Information    [name] Nakayama, MD Follow up on 09/11/2016.   Specialty:  Cardiothoracic Surgery Why:  /CXR to be taken (at [facility] which is in the same building as Dr. Leonarda Salon office) on 09/11/2016 9:15 am;Appointment time is at 9:45 am Contact information: [place] [address] [phone]        [place]. Call.   Why:  as needed Contact information: [address] [place] [address] [phone]        Judeth Horn, MD. Call.   Specialty:  General Surgery Why:  to discuss lesion on back Contact information: [address] [place] [address] [phone]        [facility] Follow up on 09/04/2016.   Specialty:  Cardiothoracic Surgery Why:  Appointment is at 10:30 for chest tube suture removal Contact information: [place], [place] [phone]          Signed: Jerrye Beavers, [facility] 08/30/2016, 7:35 AM Pager: [phone] Consults: [phone] Mon-Fri 7:00 am-4:30 pm Sat-Sun 7:00 am-11:30 am

## 2016-08-30 NOTE — Progress Notes (Signed)
      [address]       [place] [address]             [phone]      8 Days Post-Op Procedure(s) (LRB): VIDEO ASSISTED THORACOSCOPY (Left) DRAINAGE OF HEMOTHORAX (Left) RIB PLATING (Left) Subjective: Shares that his chest feels tight this morning. He has some numbness on the left side below his incision.   Objective: Vital signs in last 24 hours: Temp:  [98.2 F (36.8 C)-98.9 F (37.2 C)] 98.9 F (37.2 C) (04/19 0430) Pulse Rate:  [85-93] 85 (04/19 0430) Resp:  [18] 18 (04/19 0430) BP: (99-110)/(65-74) 110/68 (04/19 0430) SpO2:  [91 %-99 %] 92 % (04/19 0430)     Intake/Output from previous day: 04/18 0701 - 04/19 0700 In: 840 [P.O.:840] Out: 1 [Stool:1] Intake/Output this shift: No intake/output data recorded.  General appearance: alert, cooperative and no distress Heart: regular rate and rhythm, S1, S2 normal, no murmur, click, rub or gallop Lungs: CTA on the right, diminished breath sounds on the left Abdomen: soft, non-tender; bowel sounds normal; no masses,  no organomegaly Extremities: extremities normal, atraumatic, no cyanosis or edema Wound: clean and dry  Lab Results: No results for input(s): WBC, HGB, HCT, PLT in the last 72 hours. BMET:  Recent Labs  08/27/16 0846  NA 131*  K 3.5  CL 96*  CO2 27  GLUCOSE 101*  BUN 6  CREATININE 0.72  CALCIUM 8.4*    PT/INR: No results for input(s): LABPROT, INR in the last 72 hours. ABG    Component Value Date/Time   PHART 7.404 08/23/2016 0400   HCO3 27.2 08/23/2016 0400   TCO2 28 08/22/2016 1757   ACIDBASEDEF 1.1 08/22/2016 1957   O2SAT 95.9 08/23/2016 0400   CBG (last 3)  No results for input(s): GLUCAP in the last 72 hours.  Assessment/Plan: S/P Procedure(s) (LRB): VIDEO ASSISTED THORACOSCOPY (Left) DRAINAGE OF HEMOTHORAX (Left) RIB PLATING (Left)  1. CV-NSR in the 80s. BP well controlled 2. Pulm-Chest xray yesterday appears stable. On room air this morning. Encouraged  incentive spirometry and flutter valve use. Continue Dulera and albuterol QID 3. No new labs  Plan: Continues to progress. Does not appear to be wheezing this morning. Ambulate TID.    LOS: 9 days    Elgie Collard 08/30/2016

## 2016-09-04 ENCOUNTER — Encounter (INDEPENDENT_AMBULATORY_CARE_PROVIDER_SITE_OTHER): Payer: Self-pay

## 2016-09-04 ENCOUNTER — Other Ambulatory Visit: Payer: Self-pay | Admitting: *Deleted

## 2016-09-04 DIAGNOSIS — G8918 Other acute postprocedural pain: Secondary | ICD-10-CM

## 2016-09-04 DIAGNOSIS — Z4802 Encounter for removal of sutures: Secondary | ICD-10-CM

## 2016-09-04 DIAGNOSIS — B999 Unspecified infectious disease: Secondary | ICD-10-CM

## 2016-09-04 MED ORDER — OXYCODONE HCL 5 MG PO TABS: 5.0000 mg | ORAL_TABLET | Freq: Four times a day (QID) | ORAL | 0 refills | Status: DC | PRN

## 2016-09-04 MED ORDER — DOXYCYCLINE HYCLATE 100 MG PO TABS: 100.0000 mg | ORAL_TABLET | Freq: Two times a day (BID) | ORAL | 0 refills | Status: DC

## 2016-09-07 ENCOUNTER — Other Ambulatory Visit: Payer: Self-pay | Admitting: Thoracic Surgery (Cardiothoracic Vascular Surgery)

## 2016-09-07 ENCOUNTER — Ambulatory Visit: Payer: 59 | Admitting: Family Medicine

## 2016-09-07 DIAGNOSIS — J90 Pleural effusion, not elsewhere classified: Secondary | ICD-10-CM

## 2016-09-11 ENCOUNTER — Ambulatory Visit (INDEPENDENT_AMBULATORY_CARE_PROVIDER_SITE_OTHER): Payer: Self-pay | Admitting: Thoracic Surgery (Cardiothoracic Vascular Surgery)

## 2016-09-11 ENCOUNTER — Encounter: Payer: Self-pay | Admitting: Thoracic Surgery (Cardiothoracic Vascular Surgery)

## 2016-09-11 ENCOUNTER — Ambulatory Visit
Admission: RE | Admit: 2016-09-11 | Discharge: 2016-09-11 | Disposition: A | Discharge status: Home / Self Care | Payer: 59 | Source: Ambulatory Visit | Attending: Thoracic Surgery (Cardiothoracic Vascular Surgery) | Admitting: Thoracic Surgery (Cardiothoracic Vascular Surgery)

## 2016-09-11 VITALS — BP 106/68 | HR 83 | Resp 16 | Ht 69.0 in | Wt 198.0 lb

## 2016-09-11 DIAGNOSIS — J90 Pleural effusion, not elsewhere classified: Secondary | ICD-10-CM

## 2016-09-11 DIAGNOSIS — Z09 Encounter for follow-up examination after completed treatment for conditions other than malignant neoplasm: Secondary | ICD-10-CM

## 2016-09-11 DIAGNOSIS — J942 Hemothorax: Secondary | ICD-10-CM

## 2016-09-11 DIAGNOSIS — S2242XS Multiple fractures of ribs, left side, sequela: Secondary | ICD-10-CM

## 2016-09-11 NOTE — Progress Notes (Signed)
[address]       [place] [address]             [phone]     HPI: Frank Rosales returns for a scheduled postoperative follow-up visit.  He is a 61 year old man with a past medical history significant for tobacco abuse, COPD, anxiety, hypertension, and hyperlipidemia. In March he was admitted after falling off his porch and suffering multiple rib fractures. He was treated conservatively initially, but developed abrupt onset of increased pleuritic chest pain and shortness of breath. He came to the emergency room and a CT was done to rule out PE. It showed a loculated pleural effusion as well as multiple rib fractures.  On 08/22/2016 I did a left thoracotomy to evacuate the hemothorax and perform a decortication. I also did plating of the sixth and seventh ribs. He did well postoperatively and was discharged home on postoperative day #8.  He feels better. He feels like he is making progress every day. His breathing is gradually improving. His chest wall is still numb and he occasionally has pain associated with it. He is taking 2 oxycodone before he goes to bed at night to help with the pain so he can sleep, but is not having to take any narcotics during the day.  Past Medical History:  Diagnosis Date  . Anxiety   . Arthritis    "neck; lower back; right hip; right knee" (10/18/2014)  . Asthma   . Colon polyps   . COPD (chronic obstructive pulmonary disease) ([place])   . High blood pressure   . High cholesterol   . Hilar adenopathy   . Hypothyroidism   . Multiple rib fractures 08/2016   left side     Current Outpatient Prescriptions  Medication Sig Dispense Refill  . acetaminophen (TYLENOL) 325 MG tablet Take 2 tablets (650 mg total) by mouth every 6 (six) hours as needed.    Marland Kitchen albuterol (PROAIR HFA) 108 (90 Base) MCG/ACT inhaler Inhale 2 puffs into the lungs every 6 (six) hours. 1 Inhaler 2  . aspirin 81 MG tablet Take 81 mg by mouth daily.     .  budesonide-formoterol (SYMBICORT) 160-4.5 MCG/ACT inhaler Inhale 2 puffs into the lungs 2 (two) times daily.    . cholecalciferol (VITAMIN D) 1000 UNITS tablet Take 1,000 Units by mouth daily.     . folic acid (FOLVITE) 1 MG tablet Take 1 tablet (1 mg total) by mouth daily. 90 tablet 3  . guaiFENesin (MUCINEX) 600 MG 12 hr tablet Take 2 tablets (1,200 mg total) by mouth 2 (two) times daily as needed for cough or to loosen phlegm.    . Ibuprofen-Diphenhydramine HCl (ADVIL PM) 200-25 MG CAPS Take 1 capsule by mouth at bedtime as needed (for sleep).    Marland Kitchen KRILL OIL PO Take 1 Can by mouth daily.    Marland Kitchen levothyroxine (SYNTHROID, LEVOTHROID) 50 MCG tablet Take 1 tablet (50 mcg total) by mouth daily before breakfast. Wait 1 hour after taking before eating or drinking anything but water. 90 tablet 3  . methocarbamol (ROBAXIN) 500 MG tablet Take 1 tablet (500 mg total) by mouth every 6 (six) hours as needed for muscle spasms. 28 tablet 0  . NIFEdipine (PROCARDIA XL/ADALAT-CC) 90 MG 24 hr tablet TAKE 1 TABLET (90 MG TOTAL) BY MOUTH DAILY. 90 tablet 1  . oxyCODONE (OXY IR/ROXICODONE) 5 MG immediate release tablet Take 1-2 tablets (5-10 mg total) by mouth every 6 (six) hours  as needed (5mg  for moderate pain, 10mg  for severe pain). 30 tablet 0  . valsartan-hydrochlorothiazide (DIOVAN-HCT) 320-25 MG tablet TAKE 1 TABLET BY MOUTH DAILY. 90 tablet 0  . vitamin B-12 (CYANOCOBALAMIN) 250 MCG tablet Take 250 mcg by mouth daily.    Marland Kitchen ibuprofen (ADVIL,MOTRIN) 400 MG tablet Take 1 tablet (400 mg total) by mouth every 4 (four) hours as needed. (Patient not taking: Reported on 08/21/2016) 30 tablet 0   No current facility-administered medications for this visit.     Physical Exam BP 106/68 (BP Location: Right Arm, Patient Position: Sitting, Cuff Size: Large)   Pulse 83   Resp 16   Ht 5\' 9"  (1.753 m)   Wt 198 lb (89.8 kg)   SpO2 93% Comment: ON RA  BMI 29.36 kg/m  61 year old man in no acute distress Alert and  oriented 3 with no focal deficits Incisions well healed Diminished breath sounds at left base, otherwise clear  Diagnostic Tests: CHEST  2 VIEW  COMPARISON:  08/29/2016  FINDINGS: Right chest remains clear and normal. Cardiomediastinal shadows remain normal. No complications seen relating to left sixth and seventh rib ORIF. Slightly less adjacent pleural and parenchymal density. Small pleural air collection inferiorly and laterally on the left is much smaller. Fluid component likely persists.  IMPRESSION: Diminishing amount of pleural air at the left base laterally. Pleural fluid collection likely persists. Less pleural and parenchymal density adjacent to the left sixth and seventh ribs. No worsening or new finding.   Electronically Signed   By: [name] M.D.   On: 09/11/2016 09:21 I personally reviewed the chest x-ray confirmed with the findings noted above  Impression: Frank Rosales is a 61 year old gentleman who had traumatic chest injury resulting in fractures of the sixth and seventh ribs on the left side and a delayed hemothorax. He had a thoracotomy to evacuate the hemothorax, decorticate the lung, and do ORIF of the sixth and seventh ribs on 08/22/2016. He is now about 3 weeks out from surgery and is making good progress.  His pain is well-controlled. He is only taking oxycodone at night to help with the discomfort so that he can sleep. He is not using it during the day.  I long discussion with him about driving. He may begin driving a limited basis. Appropriate precautions such as avoiding high speeds and heavy traffic areas were discussed. He understands these issues.  He should avoid heavy lifting. He has back pain and avoids that anyway.  Plan: Return in one month with PA and lateral chest x-ray to check on progress.  [name] Nakayama, MD [facility] [phone]

## 2016-09-13 ENCOUNTER — Ambulatory Visit (INDEPENDENT_AMBULATORY_CARE_PROVIDER_SITE_OTHER): Payer: 59 | Admitting: Internal Medicine

## 2016-09-13 ENCOUNTER — Encounter: Payer: Self-pay | Admitting: Family Medicine

## 2016-09-13 ENCOUNTER — Ambulatory Visit (INDEPENDENT_AMBULATORY_CARE_PROVIDER_SITE_OTHER): Payer: 59 | Admitting: Family Medicine

## 2016-09-13 ENCOUNTER — Encounter: Payer: Self-pay | Admitting: Internal Medicine

## 2016-09-13 VITALS — BP 106/64 | HR 94 | Ht 69.0 in | Wt 185.4 lb

## 2016-09-13 VITALS — BP 97/60 | HR 87 | Temp 97.9°F | Ht 69.0 in | Wt 189.0 lb

## 2016-09-13 DIAGNOSIS — J449 Chronic obstructive pulmonary disease, unspecified: Secondary | ICD-10-CM | POA: Diagnosis not present

## 2016-09-13 DIAGNOSIS — S2249XA Multiple fractures of ribs, unspecified side, initial encounter for closed fracture: Secondary | ICD-10-CM

## 2016-09-13 DIAGNOSIS — G479 Sleep disorder, unspecified: Secondary | ICD-10-CM

## 2016-09-13 DIAGNOSIS — J942 Hemothorax: Secondary | ICD-10-CM

## 2016-09-13 DIAGNOSIS — E039 Hypothyroidism, unspecified: Secondary | ICD-10-CM

## 2016-09-13 DIAGNOSIS — E7800 Pure hypercholesterolemia, unspecified: Secondary | ICD-10-CM

## 2016-09-13 NOTE — Progress Notes (Signed)
Subjective:  Patient ID: Frank Rosales, male    DOB: 06/21/1955  Age: 61 y.o. MRN: [id number]  CC: Hypertension (pt here today for routine follow up of his chronic medical conditions.)   HPI ARIA PICKRELL presents for Follow-up of COPD. He remained short of breath with mild to moderate activities. He has recently stopped smoking. Still working on cravings.  Patient presents for follow-up on  thyroid. The patient has a history of hypothyroidism for many years. It has been stable recently. Pt. denies any change in  voice, loss of hair, heat or cold intolerance. Energy level has been adequate to good. Patient denies constipation and diarrhea. No myxedema. Medication is as noted below. Verified that pt is taking it daily on an empty stomach. Well tolerated.  Patient in for follow-up of elevated cholesterol. Doing well without complaints on current medication. Denies side effects of statin including myalgia and arthralgia and nausea. Also in today for liver function testing. Currently no chest pain, shortness of breath or other cardiovascular related symptoms noted.  Patient recently hospitalized for rib fractures leading to hemothorax. He remains under the care of pulmonology. History Hassel has a past medical history of Anxiety; Arthritis; Asthma; Colon polyps; COPD (chronic obstructive pulmonary disease) ([place]); High blood pressure; High cholesterol; Hilar adenopathy; Hypothyroidism; and Multiple rib fractures (08/2016).   He has a past surgical history that includes Knee arthroscopy (Right, 05/14/2010); Wisdom tooth extraction; epidural injections; Fixation kyphoplasty lumbar spine; Back surgery; Video assisted thoracoscopy (Left, 08/22/2016); Pleural effusion drainage (Left, 08/22/2016); and Rib plating (Left, 08/22/2016).   His family history includes Emphysema in his father; Heart disease in his brother; Heart failure in his mother; Lung cancer in his father.He reports that he quit smoking  about 5 weeks ago. His smoking use included Cigarettes. He started smoking about 43 years ago. He has a 70.00 pack-year smoking history. He has never used smokeless tobacco. He reports that he drinks about 25.2 oz of alcohol per week . He reports that he does not use drugs.    ROS Review of Systems  Constitutional: Negative for chills, diaphoresis, fever and unexpected weight change.  HENT: Negative for congestion, hearing loss, rhinorrhea and sore throat.   Eyes: Negative for visual disturbance.  Respiratory: Positive for chest tightness (With chest wall pain) and shortness of breath. Negative for cough.   Cardiovascular: Negative for chest pain.  Gastrointestinal: Negative for abdominal pain, constipation and diarrhea.  Genitourinary: Negative for dysuria and flank pain.  Musculoskeletal: Positive for arthralgias. Negative for joint swelling.  Skin: Negative for rash.  Neurological: Negative for dizziness and headaches.  Psychiatric/Behavioral: Negative for dysphoric mood and sleep disturbance.    Objective:  BP 97/60   Pulse 87   Temp 97.9 F (36.6 C) (Oral)   Ht 5' 9"  (1.753 m)   Wt 189 lb (85.7 kg)   BMI 27.91 kg/m   BP Readings from Last 3 Encounters:  09/13/16 106/64  09/13/16 97/60  09/11/16 106/68    Wt Readings from Last 3 Encounters:  09/13/16 185 lb 6.4 oz (84.1 kg)  09/13/16 189 lb (85.7 kg)  09/11/16 198 lb (89.8 kg)     Physical Exam  Constitutional: He is oriented to person, place, and time. He appears well-developed and well-nourished. No distress.  HENT:  Head: Normocephalic and atraumatic.  Right Ear: External ear normal.  Left Ear: External ear normal.  Nose: Nose normal.  Mouth/Throat: Oropharynx is clear and moist.  Eyes: Conjunctivae and EOM are  normal. Pupils are equal, round, and reactive to light.  Neck: Normal range of motion. Neck supple. No thyromegaly present.  Cardiovascular: Normal rate, regular rhythm and normal heart sounds.   No  murmur heard. Pulmonary/Chest: Effort normal. No accessory muscle usage. No respiratory distress. He has decreased breath sounds. He has wheezes. He has no rales.  Abdominal: Soft. Bowel sounds are normal. He exhibits no distension. There is no tenderness.  Lymphadenopathy:    He has no cervical adenopathy.  Neurological: He is alert and oriented to person, place, and time. He has normal reflexes.  Skin: Skin is warm and dry.  Psychiatric: He has a normal mood and affect. His behavior is normal. Judgment and thought content normal.      Assessment & Plan:   Vong was seen today for hypertension.  Diagnoses and all orders for this visit:  COPD GOLD III  -     CMP14+EGFR  High cholesterol -     CMP14+EGFR -     Lipid panel  Hypothyroidism, unspecified type -     CBC with Differential/Platelet -     CMP14+EGFR -     TSH + free T4  Sleep disturbance -     CMP14+EGFR  Closed fracture of multiple ribs, unspecified laterality, initial encounter -     CBC with Differential/Platelet -     CMP14+EGFR  Hemothorax on left -     CBC with Differential/Platelet -     CMP14+EGFR       I have discontinued Mr. Want methocarbamol. I am also having him maintain his aspirin, cholecalciferol, vitamin J-19, folic acid, KRILL OIL PO, levothyroxine, valsartan-hydrochlorothiazide, acetaminophen, ibuprofen, budesonide-formoterol, Ibuprofen-Diphenhydramine HCl, guaiFENesin, albuterol, and oxyCODONE.  Allergies as of 09/13/2016      Reactions   No Known Allergies       Medication List       Accurate as of 09/13/16 11:59 PM. Always use your most recent med list.          acetaminophen 325 MG tablet Commonly known as:  TYLENOL Take 2 tablets (650 mg total) by mouth every 6 (six) hours as needed.   ADVIL PM 200-25 MG Caps Generic drug:  Ibuprofen-Diphenhydramine HCl Take 1 capsule by mouth at bedtime as needed (for sleep).   albuterol 108 (90 Base) MCG/ACT inhaler Commonly  known as:  PROAIR HFA Inhale 2 puffs into the lungs every 6 (six) hours.   aspirin 81 MG tablet Take 81 mg by mouth daily.   budesonide-formoterol 160-4.5 MCG/ACT inhaler Commonly known as:  SYMBICORT Inhale 2 puffs into the lungs 2 (two) times daily.   cholecalciferol 1000 units tablet Commonly known as:  VITAMIN D Take 1,000 Units by mouth daily.   folic acid 1 MG tablet Commonly known as:  FOLVITE Take 1 tablet (1 mg total) by mouth daily.   guaiFENesin 600 MG 12 hr tablet Commonly known as:  MUCINEX Take 2 tablets (1,200 mg total) by mouth 2 (two) times daily as needed for cough or to loosen phlegm.   ibuprofen 400 MG tablet Commonly known as:  ADVIL,MOTRIN Take 1 tablet (400 mg total) by mouth every 4 (four) hours as needed.   KRILL OIL PO Take 1 Can by mouth daily.   levothyroxine 50 MCG tablet Commonly known as:  SYNTHROID, LEVOTHROID Take 1 tablet (50 mcg total) by mouth daily before breakfast. Wait 1 hour after taking before eating or drinking anything but water.   NIFEdipine 90 MG 24 hr tablet Commonly  known as:  PROCARDIA XL/ADALAT-CC TAKE 1 TABLET (90 MG TOTAL) BY MOUTH DAILY.   oxyCODONE 5 MG immediate release tablet Commonly known as:  Oxy IR/ROXICODONE Take 1-2 tablets (5-10 mg total) by mouth every 6 (six) hours as needed (81m for moderate pain, 132mfor severe pain).   valsartan-hydrochlorothiazide 320-25 MG tablet Commonly known as:  DIOVAN-HCT TAKE 1 TABLET BY MOUTH DAILY.   vitamin B-12 250 MCG tablet Commonly known as:  CYANOCOBALAMIN Take 250 mcg by mouth daily.        Follow-up: Return in about 6 months (around 03/16/2017) for COPD, cholesterol, Hypothyroidism.  [place] FraiseM.D.

## 2016-09-13 NOTE — Patient Instructions (Signed)
No change in medications  Most important aspect of care is to maintain off cigarettes  Please schedule a follow up office visit in 6 weeks, call sooner if needed with pfts on return

## 2016-09-13 NOTE — Progress Notes (Signed)
Subjective:    Patient ID: Frank Rosales, male    DOB: [birth year]    MRN: [id number]    Brief patient profile:  5 yowm quit smoking 08/12/16  with h/o exposure to Asbestosis at [facility] ? Into the 80s  still able to do flat fine but steps x 3 flights sob with GOLD III copd 2013 prev eval by Dr Joya Gaskins  s/p admit      History of Present Illness  02/16/2016  f/u ov/Frank Rosales re:  GOLD III copd /  symbicort 160 2bid / twice weekly saba  Chief Complaint  Patient presents with  . Follow-up    Pt states had CT Chest done 12/27/15- ordered by worker's comp for eval of previous asbestos exp. He states he had been doing well until July 2017 developed cough and congestion that lasted 2 months, but starting to improve.   sick July - September 2017 rx with abx/ steroids better since early Sept 2017 but CT was done in middle of this illness and was abnormal but was done for purpose of screening for asbestos  rec Plan A = Automatic = symbicort 160 Take 2 puffs first thing in am and then another 2 puffs about 12 hours later.  Work on inhaler technique:   Only use your albuterol(proair)  as a rescue medication  The key is to stop smoking completely before smoking completely stops you!    Admit date: 08/20/2016 Discharge date: 08/30/2016  Admitting Diagnosis: Left hemothorax Atelectasis  Discharge Diagnosis     Patient Active Problem List   Diagnosis Date Noted  . Hemothorax on left 08/21/2016  . Rib fractures 08/05/2016  . Asbestosis ([place]) 02/16/2016  . Sleep disturbance 07/06/2015  . Alcohol abuse 10/19/2014  . GAD (generalized anxiety disorder) 07/01/2014  . Hypothyroidism 12/03/2013  . Osteopenia 03/11/2013  . Cigarette smoker 07/12/2011  . Essential hypertension   . COPD GOLD III    . High cholesterol   . Hilar adenopathy     Consultants Modesto Charon MD - cardiothoracic surgery  Imaging:  ImagingResults(Last48hours)  Dg Chest 2 View  Result Date:  08/29/2016 CLINICAL DATA:  Several days of wheezing. Left lung surgery for drainage of a hemothorax 1 week ago EXAM: CHEST  2 VIEW COMPARISON:  Chest x-ray of August 28, 2016 FINDINGS: The right lung is mildly hyperinflated and clear. On the left there is mild stable volume loss. There is a persistent loculated left-sided hydropneumothorax. No apical pneumothorax is evident. There is stable density in the left suprahilar region extending into the apex. There are known left lateral rib fractures. The heart and pulmonary vascularity are normal. The mediastinum is normal in width. IMPRESSION: Stable appearance of the loculated left basilar hydropneumothorax. No significant change in the appearance of the chest since yesterday's study. Electronically Signed   By: David  Martinique M.D.   On: 08/29/2016 10:03    DG chest 2 view 08/28/16: 1. Loculated hydropneumothorax at the left lung base remain stable. Decreased non dependent left pneumothorax since yesterday. 2. Postoperative changes and patchy opacity also in the left mid and upper lung. 3. No new cardiopulmonary abnormality.  DG chest 1V repeat same day 08/27/16: 1. Interval removal of the left-sided chest tube. Persistent left basilar low loculated pneumothorax.  CT angio chest PE w or wo contrast 08/21/16: 1. Significant interval increase and moderate to large loculated right-sided pleural effusion. New areas of increased attenuation within the effusion, suspicious for interval hemorrhage into the left hemithorax since the prior  study. Underlying atelectasis noted. Infection cannot be entirely excluded, though there is no evidence of empyema at this time. 2. Fractures of the left lateral fifth through eighth ribs again noted, with  comminution at the left sixth and seventh ribs. Associated soft tissue hemorrhage along the left chest wall, with residual soft tissue air along the left chest wall. 3. Mild right basilar atelectasis. Scattered peripheral blebs  at the lung apices. 4. 1.5 cm azygoesophageal recess node noted. 5. Chronic compression deformities of vertebral bodies T12 and L1, with changes of vertebroplasty at L1.  Procedures Dr. [name] (08/22/16) - Left thoracotomy, evacuation of hemothorax and decortication, plating of 6th and 7th ribs  Hospital Course:  Frank Rosales is a 61yo male PMH COPD who [facility]16 after a recent admission forground level fall on 08/05/16 where he sustained multiple left-sided rib fractures. Discharged 08/09/16 in good condition.  Returned to ED 4/9 complaining of acute worsening left sided chest pain. Workup showed a large left pleural effusion/hemothorax.  Patient was admitted for pain control and further workup/treatment. Cardiothoracic surgery consulted and recommended VATS/thoracotomy for loculated hemothorax.  Patient underwent this procedure on 4/11. Tolerated procedure well and was transferred to the ICU for monitoring. Patient was instructed to use incentive spirometer and flutter valve regularly, and he was on dulera BID and duonebs TID. Diet was advanced as tolerated.  CXR on 4/14 was stable and showed no pneumothorax, therefore the posterior chest tube was removed. CXR on 4/16 was unchanged and remaining chest tube was removed. He did have increased wheezing on 4/18, but this responded well to increasing inhaler use to QID. On 4/19 his CXR remained stable, O2 sats stable on room air, the patient was voiding well, tolerating diet, ambulating well, pain well controlled, vital signs stable, incisions c/d/i and felt stable for discharge home.  Patient will follow up with Dr. [name] as below, and knows to call with questions or concerns.     09/13/2016  f/u ov/Frank Rosales re: post hosp f/u - no longer smoking / symb 160 2bid maint Chief Complaint  Patient presents with  . Follow-up    Pt states fell and broke 4 ribs approx 1 month ago and developed hemothorax. He had surgery done by Dr.  [name]. He has occ chest discomfort uponn inspiration. He is coughing some with clear to green sputum.  He has not needed albuterol in the past few wks.   overall improving back toward  baseline, minimally discolored am mucus  Doe = MMRC3 = can't walk 100 yards even at a slow pace at a flat grade s stopping due to sob     No obvious day to day or daytime variability or assoc  mucus plugs or hemoptysis   or chest tightness, subjective wheeze or overt sinus or hb symptoms. No unusual exp hx or h/o childhood pna/ asthma or knowledge of premature birth.  Sleeping ok without nocturnal  or early am exacerbation  of respiratory  c/o's or need for noct saba. Also denies any obvious fluctuation of symptoms with weather or environmental changes or other aggravating or alleviating factors except as outlined above   Current Medications, Allergies, Complete Past Medical History, Past Surgical History, Family History, and Social History were reviewed in [employer] record.  ROS  The following are not active complaints unless bolded sore throat, dysphagia, dental problems, itching, sneezing,  nasal congestion or excess/ purulent secretions, ear ache,   fever, chills, sweats, unintended wt loss, classically  exertional cp,  orthopnea pnd or leg swelling, presyncope, palpitations, abdominal pain, anorexia, nausea, vomiting, diarrhea  or change in bowel or bladder habits, change in stools or urine, dysuria,hematuria,  rash, arthralgias, visual complaints, headache, numbness, weakness or ataxia or problems with walking or coordination,  change in mood/affect or memory.                   Objective:   Physical Exam  amb minimally  hoarse wm   nad    01/24/2015        180 > 02/21/2015  180  > 06/16/2015  186  > 02/16/2016  205 > 09/13/2016   185     11/10/14 174 lb (78.926 kg)  11/08/14 176 lb 6.4 oz (80.015 kg)  10/27/14 175 lb (79.379 kg)    Vital signs reviewed  - sats 94% RA on  arrival   Gen: Pleasant, well-nourished, in no distress,  normal affect  ENT: No lesions,  mouth clear,  oropharynx clear, no postnasal drip  Neck: No JVD, no TMG, no carotid bruits  Lungs: No use of accessory muscles, no dullness to percussion, distant BS/ minimal bilateral exp rhonchi   Cardiovascular: RRR, heart sounds normal, no murmur or gallops, no peripheral edema  Abdomen: soft and NT, no HSM,  BS normal  Musculoskeletal: No deformities, no cyanosis or clubbing  Neuro: alert, non focal  Skin: Warm, no lesions or rashes    I personally reviewed images and agree with radiology impression as follows:  CXR:   Diminishing amount of pleural air at the left base laterally. Pleural fluid collection likely persists. Less pleural and parenchymal density adjacent to the left sixth and seventh ribs. No worsening or new finding.    Assessment & Plan:

## 2016-09-14 ENCOUNTER — Ambulatory Visit: Payer: 59 | Admitting: Internal Medicine

## 2016-09-14 LAB — CBC WITH DIFFERENTIAL/PLATELET
BASOS: 0 %
Basophils Absolute: 0 10*3/uL (ref 0.0–0.2)
EOS (ABSOLUTE): 0.3 10*3/uL (ref 0.0–0.4)
Eos: 3 %
HEMATOCRIT: 39 % (ref 37.5–51.0)
HEMOGLOBIN: 13.3 g/dL (ref 13.0–17.7)
Immature Grans (Abs): 0 10*3/uL (ref 0.0–0.1)
Immature Granulocytes: 0 %
LYMPHS ABS: 1.5 10*3/uL (ref 0.7–3.1)
Lymphs: 15 %
MCH: 33.3 pg — AB (ref 26.6–33.0)
MCHC: 34.1 g/dL (ref 31.5–35.7)
MCV: 98 fL — AB (ref 79–97)
MONOCYTES: 9 %
Monocytes Absolute: 0.9 10*3/uL (ref 0.1–0.9)
NEUTROS ABS: 7.2 10*3/uL — AB (ref 1.4–7.0)
Neutrophils: 73 %
Platelets: 397 10*3/uL — ABNORMAL HIGH (ref 150–379)
RBC: 3.99 x10E6/uL — ABNORMAL LOW (ref 4.14–5.80)
RDW: 13.9 % (ref 12.3–15.4)
WBC: 9.9 10*3/uL (ref 3.4–10.8)

## 2016-09-14 LAB — CMP14+EGFR
A/G RATIO: 1.5 (ref 1.2–2.2)
ALBUMIN: 4.1 g/dL (ref 3.5–5.5)
ALK PHOS: 129 IU/L — AB (ref 39–117)
ALT: 22 IU/L (ref 0–44)
AST: 16 IU/L (ref 0–40)
BUN / CREAT RATIO: 8 — AB (ref 9–20)
BUN: 7 mg/dL (ref 6–24)
Bilirubin Total: 0.3 mg/dL (ref 0.0–1.2)
CO2: 25 mmol/L (ref 18–29)
Calcium: 9.3 mg/dL (ref 8.7–10.2)
Chloride: 95 mmol/L — ABNORMAL LOW (ref 96–106)
Creatinine, Ser: 0.91 mg/dL (ref 0.76–1.27)
GFR calc Af Amer: 107 mL/min/{1.73_m2} (ref >59)
GFR, EST NON AFRICAN AMERICAN: 93 mL/min/{1.73_m2} (ref >59)
GLOBULIN, TOTAL: 2.8 g/dL (ref 1.5–4.5)
Glucose: 113 mg/dL — ABNORMAL HIGH (ref 65–99)
POTASSIUM: 4.4 mmol/L (ref 3.5–5.2)
SODIUM: 137 mmol/L (ref 134–144)
Total Protein: 6.9 g/dL (ref 6.0–8.5)

## 2016-09-14 LAB — TSH+FREE T4
FREE T4: 1.48 ng/dL (ref 0.82–1.77)
TSH: 2.2 u[IU]/mL (ref 0.450–4.500)

## 2016-09-14 LAB — LIPID PANEL
CHOL/HDL RATIO: 3 ratio (ref 0.0–5.0)
CHOLESTEROL TOTAL: 150 mg/dL (ref 100–199)
HDL: 50 mg/dL (ref >39)
LDL CALC: 82 mg/dL (ref 0–99)
TRIGLYCERIDES: 91 mg/dL (ref 0–149)
VLDL Cholesterol Cal: 18 mg/dL (ref 5–40)

## 2016-09-16 NOTE — Assessment & Plan Note (Signed)
Spirometry 03/2011 FeV1 49%    Spirometry  02/28/2012  FEV1 45%   - Last day worked = October 13 2014  - PFT's  01/24/2015  FEV1 1.59  (43 % ) ratio 54   p 22 % improvement from saba with DLCO  88 % corrects to 99 % for alv volume s symbicort  - PFTs  12/27/15 1.30 (35%) ratio 35  In midst of a flare- - 02/16/2016  After extensive coaching HFA effectiveness =    75%  - quit smoking 08/12/16   Amazingly made in thru major chest trauma s needing ventilator and getting back to baseline which, off cigs, may actually be higher than he was prior to accident as long as he stays off cigs  No change in meds needed, would like to get a new baseline set of pfts on return and let him finish with Dr [name] in meantime   I had an extended discussion with the patient reviewing all relevant studies completed to date (including extensive hosp records and images) and  lasting 15 to 20 minutes of a 25 minute visit    Each maintenance medication was reviewed in detail including most importantly the difference between maintenance and prns and under what circumstances the prns are to be triggered using an action plan format that is not reflected in the computer generated alphabetically organized AVS.    Please see AVS for specific instructions unique to this visit that I personally wrote and verbalized to the the pt in detail and then reviewed with pt  by my nurse highlighting any  changes in therapy recommended at today's visit to their plan of care.

## 2016-09-18 ENCOUNTER — Telehealth: Payer: Self-pay | Admitting: *Deleted

## 2016-09-18 ENCOUNTER — Other Ambulatory Visit: Payer: Self-pay | Admitting: Family Medicine

## 2016-09-18 MED ORDER — NIFEDIPINE ER OSMOTIC RELEASE 30 MG PO TB24: 30.0000 mg | ORAL_TABLET | Freq: Every day | ORAL | 3 refills | Status: DC

## 2016-09-18 NOTE — Telephone Encounter (Signed)
I sent in the requested prescription 

## 2016-09-18 NOTE — Telephone Encounter (Signed)
Wife aware per dpr. Rx sent to the correct pharmacy.

## 2016-09-18 NOTE — Telephone Encounter (Signed)
Pt called in regarding BP medication Pt thought dosage was going to be changed due to hypotension Please review and advise

## 2016-10-09 ENCOUNTER — Other Ambulatory Visit: Payer: Self-pay | Admitting: Family Medicine

## 2016-10-11 ENCOUNTER — Other Ambulatory Visit: Payer: Self-pay | Admitting: Family Medicine

## 2016-10-11 DIAGNOSIS — J449 Chronic obstructive pulmonary disease, unspecified: Secondary | ICD-10-CM

## 2016-10-16 ENCOUNTER — Encounter: Payer: 59 | Admitting: Thoracic Surgery (Cardiothoracic Vascular Surgery)

## 2016-10-19 ENCOUNTER — Other Ambulatory Visit: Payer: Self-pay | Admitting: Thoracic Surgery (Cardiothoracic Vascular Surgery)

## 2016-10-22 ENCOUNTER — Ambulatory Visit: Payer: 59

## 2016-10-22 ENCOUNTER — Other Ambulatory Visit: Payer: Self-pay | Admitting: Thoracic Surgery (Cardiothoracic Vascular Surgery)

## 2016-10-22 DIAGNOSIS — J942 Hemothorax: Secondary | ICD-10-CM

## 2016-10-23 ENCOUNTER — Ambulatory Visit (INDEPENDENT_AMBULATORY_CARE_PROVIDER_SITE_OTHER): Payer: Self-pay | Admitting: Surgical

## 2016-10-23 ENCOUNTER — Ambulatory Visit
Admission: RE | Admit: 2016-10-23 | Discharge: 2016-10-23 | Disposition: A | Discharge status: Home / Self Care | Payer: 59 | Source: Ambulatory Visit | Attending: Thoracic Surgery (Cardiothoracic Vascular Surgery) | Admitting: Thoracic Surgery (Cardiothoracic Vascular Surgery)

## 2016-10-23 VITALS — BP 134/89 | HR 107 | Resp 16 | Ht 69.0 in | Wt 189.0 lb

## 2016-10-23 DIAGNOSIS — J942 Hemothorax: Secondary | ICD-10-CM

## 2016-10-23 DIAGNOSIS — G629 Polyneuropathy, unspecified: Secondary | ICD-10-CM

## 2016-10-23 DIAGNOSIS — S2242XS Multiple fractures of ribs, left side, sequela: Secondary | ICD-10-CM

## 2016-10-23 DIAGNOSIS — Z09 Encounter for follow-up examination after completed treatment for conditions other than malignant neoplasm: Secondary | ICD-10-CM

## 2016-10-23 NOTE — Progress Notes (Signed)
[address]       [place] [address]             [id number]                  Frank Rosales [place] Medical Record #[id number] Date of Birth: Apr 24, 1956  Referring [facility]:GURKY, Jeneen Rinks, MD Primary Cardiology: Primary Care:Stacks, Cletus Gash, MD  Chief Complaint:  Follow Up Visit  DATE OF PROCEDURE:  08/22/2016 DATE OF DISCHARGE:                              OPERATIVE REPORT   PREOPERATIVE DIAGNOSIS:  Left hemothorax and rib fractures.  POSTOPERATIVE DIAGNOSIS:  Left hemothorax and rib fractures.  PROCEDURE:  Left thoracotomy, evacuation of hemothorax and decortication, plating of 6th and 7th ribs.  SURGEON:  Revonda Standard. [name], M.D.  ASSISTANT: 1. Lars Pinks, [place], PA-C.  ANESTHESIA:  General.   History of Present Illness:    The patient is a 61 year old male status post the above described procedure seen in the office on today's date. Overall he feels though he is doing well. He is not very active but in general has not been due to significant COPD and long term back pain. He is able to walk some. He states that he is having some numbness associated with the incision it is not using any pain medication at this time. He has had no fevers, chills or other significant constitutional symptoms. His incision is healing well without any drainage or erythema.    Zubrod Score: At the time of surgery this patient's most appropriate activity status/level should be described as: []     0    Normal activity, no symptoms []     1    Restricted in physical strenuous activity but ambulatory, able to do out light work [x]     2    Ambulatory and capable of self care, unable to do work activities, up and about                 >50 % of waking hours                                                                                   []     3    Only limited self care, in bed greater than 50% of waking hours []     4    Completely  disabled, no self care, confined to bed or chair []     5    Moribund  History  Smoking Status  . Former Smoker  . Packs/day: 1.75  . Years: 40.00  . Types: Cigarettes  . Start date: 05/14/1973  . Quit date: 08/12/2016  Smokeless Tobacco  . Never Used    Comment:          Allergies  Allergen Reactions  . No Known Allergies     Current Outpatient Prescriptions  Medication Sig Dispense Refill  . acetaminophen (TYLENOL) 325 MG tablet Take 2 tablets (650 mg total) by mouth every 6 (six) hours as needed.    Marland Kitchen  albuterol (PROAIR HFA) 108 (90 Base) MCG/ACT inhaler Inhale 2 puffs into the lungs every 6 (six) hours. 1 Inhaler 2  . aspirin 81 MG tablet Take 81 mg by mouth daily.     . budesonide-formoterol (SYMBICORT) 160-4.5 MCG/ACT inhaler Inhale 2 puffs into the lungs 2 (two) times daily.    . cholecalciferol (VITAMIN D) 1000 UNITS tablet Take 1,000 Units by mouth daily.     . folic acid (FOLVITE) 1 MG tablet Take 1 tablet (1 mg total) by mouth daily. 90 tablet 3  . ibuprofen (ADVIL,MOTRIN) 400 MG tablet Take 1 tablet (400 mg total) by mouth every 4 (four) hours as needed. 30 tablet 0  . Ibuprofen-Diphenhydramine HCl (ADVIL PM) 200-25 MG CAPS Take 1 capsule by mouth at bedtime as needed (for sleep).    Marland Kitchen KRILL OIL PO Take 1 Can by mouth daily.    Marland Kitchen levothyroxine (SYNTHROID, LEVOTHROID) 50 MCG tablet Take 1 tablet (50 mcg total) by mouth daily before breakfast. Wait 1 hour after taking before eating or drinking anything but water. 90 tablet 3  . NIFEdipine (PROCARDIA-XL/ADALAT-CC/NIFEDICAL-XL) 30 MG 24 hr tablet Take 1 tablet (30 mg total) by mouth daily. 90 tablet 3  . SYMBICORT 160-4.5 MCG/ACT inhaler INHALE 2 PUFFS TWICE A DAY 30.6 Inhaler 1  . valsartan-hydrochlorothiazide (DIOVAN-HCT) 320-25 MG tablet TAKE 1 TABLET BY MOUTH DAILY. 90 tablet 1  . vitamin B-12 (CYANOCOBALAMIN) 250 MCG tablet Take 250 mcg by mouth daily.     No current facility-administered medications for this visit.         Physical Exam: BP 134/89 (BP Location: Left Arm, Patient Position: Sitting, Cuff Size: Large)   Pulse (!) 107   Resp 16   Ht 5\' 9"  (1.753 m)   Wt 189 lb (85.7 kg)   SpO2 95% Comment: ON RA  BMI 27.91 kg/m   General appearance: alert, cooperative and no distress Heart: regular rate and rhythm Lungs: Mildly diminished in the left base Extremities: No edema Wound: Incision well-healed without evidence of infection Wounds:  Diagnostic Studies & Laboratory data:         Recent Radiology Findings: Dg Chest 2 View  Result Date: 10/23/2016 CLINICAL DATA:  Followup left hemothorax. Vats and drainage in April. EXAM: CHEST  2 VIEW COMPARISON:  09/11/2016 FINDINGS: Right chest remains clear except for mild scarring. Old healed rib fractures on the right. On the left, small amount of pleural air has resorbed and been replaced by pleural fluid in the lateral left costophrenic angle. Chronic pleural blunting. Chronic pulmonary scarring. Previous thoracoplasty with plates and screws at the sixth and seventh ribs. Fracture of the lateral eighth rib is evident. Old thoracic compression fractures appear the same. IMPRESSION: Resorption of pleural air on the left, replaced by fluid with blunting of the left lateral costophrenic angle. Left eighth rib fracture laterally. Electronically Signed   By: [name] M.D.   On: 10/23/2016 13:14      I have independently reviewed the above radiology findings and reviewed findings  with the patient.  Recent Labs: Lab Results  Component Value Date   WBC 9.9 09/13/2016   HGB 13.3 09/13/2016   HCT 39.0 09/13/2016   PLT 397 (H) 09/13/2016   GLUCOSE 113 (H) 09/13/2016   CHOL 150 09/13/2016   TRIG 91 09/13/2016   HDL 50 09/13/2016   LDLCALC 82 09/13/2016   ALT 22 09/13/2016   AST 16 09/13/2016   NA 137 09/13/2016   K 4.4 09/13/2016  CL 95 (L) 09/13/2016   CREATININE 0.91 09/13/2016   BUN 7 09/13/2016   CO2 25 09/13/2016   TSH 2.200  09/13/2016   INR 1.16 08/21/2016   HGBA1C 5.9 (H) 03/07/2016      Assessment / Plan:  The patient was doing quite well. Dr. [name] reviewed the chest x-ray and is quite satisfied with appearance. The patient understands that the numbness in the region of the incision will take some time to fully resolve and that potentially there could be some long-term residual. He is felt to be quite stable for being seen on a when necessary basis at this point.        GOLD,WAYNE E 10/23/2016 2:01 PM

## 2016-10-23 NOTE — Patient Instructions (Signed)
F/u prn

## 2016-11-02 ENCOUNTER — Ambulatory Visit (INDEPENDENT_AMBULATORY_CARE_PROVIDER_SITE_OTHER): Payer: 59 | Admitting: Family Medicine

## 2016-11-02 ENCOUNTER — Ambulatory Visit (INDEPENDENT_AMBULATORY_CARE_PROVIDER_SITE_OTHER): Payer: 59

## 2016-11-02 ENCOUNTER — Encounter: Payer: Self-pay | Admitting: Family Medicine

## 2016-11-02 VITALS — BP 113/64 | HR 96 | Temp 97.7°F | Ht 69.0 in | Wt 191.0 lb

## 2016-11-02 DIAGNOSIS — J441 Chronic obstructive pulmonary disease with (acute) exacerbation: Secondary | ICD-10-CM

## 2016-11-02 MED ORDER — BETAMETHASONE SOD PHOS & ACET 6 (3-3) MG/ML IJ SUSP
6.0000 mg | Freq: Once | INTRAMUSCULAR | Status: AC
  Administered 2016-11-02: 6 mg via INTRAMUSCULAR

## 2016-11-02 MED ORDER — LEVOFLOXACIN 500 MG PO TABS: 500.0000 mg | ORAL_TABLET | Freq: Every day | ORAL | 0 refills | Status: DC

## 2016-11-02 NOTE — Progress Notes (Signed)
Subjective:  Patient ID: Frank Rosales, male    DOB: [birth year]  Age: 61 y.o. MRN: [id number]  CC: Cough (pt here today c/o cough, congestion and increased SOB.)   HPI Frank Rosales presents for Onset 3 days ago of feeling like he had the crud. Sore throat for a day when way but then he started getting increasing facial congestion and started coughing and blowing his nose and producing a lot of thick colored sputum and phlegm. He has a tightness in his chest. Patient has history of COPD and recent surgery for evacuation of a hemopneumothorax.   History Frank Rosales has a past medical history of Anxiety; Arthritis; Asthma; Colon polyps; COPD (chronic obstructive pulmonary disease) ([place]); High blood pressure; High cholesterol; Hilar adenopathy; Hypothyroidism; and Multiple rib fractures (08/2016).   He has a past surgical history that includes Knee arthroscopy (Right, 05/14/2010); Wisdom tooth extraction; epidural injections; Fixation kyphoplasty lumbar spine; Back surgery; Video assisted thoracoscopy (Left, 08/22/2016); Pleural effusion drainage (Left, 08/22/2016); and Rib plating (Left, 08/22/2016).   His family history includes Emphysema in his father; Heart disease in his brother; Heart failure in his mother; Lung cancer in his father.He reports that he quit smoking about 2 months ago. His smoking use included Cigarettes. He started smoking about 43 years ago. He has a 70.00 pack-year smoking history. He has never used smokeless tobacco. He reports that he drinks about 25.2 oz of alcohol per week . He reports that he does not use drugs.    ROS Review of Systems  Constitutional: Negative for activity change, appetite change, chills and fever.  HENT: Positive for congestion, postnasal drip and rhinorrhea. Negative for ear discharge, ear pain, hearing loss, nosebleeds, sinus pressure, sneezing and trouble swallowing.   Respiratory: Positive for cough, chest tightness, shortness of breath and  wheezing.   Cardiovascular: Negative for chest pain and palpitations.  Skin: Negative for rash.    Objective:  BP 113/64   Pulse 96   Temp 97.7 F (36.5 C) (Oral)   Ht 5\' 9"  (1.753 m)   Wt 191 lb (86.6 kg)   SpO2 96%   BMI 28.21 kg/m   BP Readings from Last 3 Encounters:  11/02/16 113/64  10/23/16 134/89  09/13/16 106/64    Wt Readings from Last 3 Encounters:  11/02/16 191 lb (86.6 kg)  10/23/16 189 lb (85.7 kg)  09/13/16 185 lb 6.4 oz (84.1 kg)     Physical Exam  Constitutional: He appears well-developed and well-nourished. No distress.  HENT:  Head: Normocephalic and atraumatic.  Right Ear: Tympanic membrane and external ear normal. No decreased hearing is noted.  Left Ear: Tympanic membrane and external ear normal. No decreased hearing is noted.  Nose: Mucosal edema present. Right sinus exhibits no frontal sinus tenderness. Left sinus exhibits no frontal sinus tenderness.  Mouth/Throat: No oropharyngeal exudate or posterior oropharyngeal erythema.  Neck: Neck supple. No JVD present. No Brudzinski's sign noted.  Pulmonary/Chest: Effort normal. No stridor. No respiratory distress. He has wheezes.  Abdominal: Soft.  Musculoskeletal: Normal range of motion.  Lymphadenopathy:       Head (right side): No preauricular adenopathy present.       Head (left side): No preauricular adenopathy present.       Right cervical: No superficial cervical adenopathy present.      Left cervical: No superficial cervical adenopathy present.  Skin: Skin is warm and dry.      Assessment & Plan:   Frank Rosales was seen today for  cough.  Diagnoses and all orders for this visit:  COPD exacerbation ([place]) -     DG Chest 2 View; Future -     betamethasone acetate-betamethasone sodium phosphate (CELESTONE) injection 6 mg; Inject 1 mL (6 mg total) into the muscle once.  Other orders -     levofloxacin (LEVAQUIN) 500 MG tablet; Take 1 tablet (500 mg total) by mouth daily. For 10  days    Chest x-ray shows evidence for his recent surgery. No acute infiltrate identified. COPD noted   I have discontinued Frank Rosales budesonide-formoterol. I am also having him start on levofloxacin. Additionally, I am having him maintain his aspirin, cholecalciferol, vitamin W-23, folic acid, KRILL OIL PO, levothyroxine, acetaminophen, ibuprofen, Ibuprofen-Diphenhydramine HCl, albuterol, NIFEdipine, valsartan-hydrochlorothiazide, and SYMBICORT. We administered betamethasone acetate-betamethasone sodium phosphate.  Allergies as of 11/02/2016      Reactions   No Known Allergies       Medication List       Accurate as of 11/02/16  4:16 PM. Always use your most recent med list.          acetaminophen 325 MG tablet Commonly known as:  TYLENOL Take 2 tablets (650 mg total) by mouth every 6 (six) hours as needed.   ADVIL PM 200-25 MG Caps Generic drug:  Ibuprofen-Diphenhydramine HCl Take 1 capsule by mouth at bedtime as needed (for sleep).   albuterol 108 (90 Base) MCG/ACT inhaler Commonly known as:  PROAIR HFA Inhale 2 puffs into the lungs every 6 (six) hours.   aspirin 81 MG tablet Take 81 mg by mouth daily.   cholecalciferol 1000 units tablet Commonly known as:  VITAMIN D Take 1,000 Units by mouth daily.   folic acid 1 MG tablet Commonly known as:  FOLVITE Take 1 tablet (1 mg total) by mouth daily.   ibuprofen 400 MG tablet Commonly known as:  ADVIL,MOTRIN Take 1 tablet (400 mg total) by mouth every 4 (four) hours as needed.   KRILL OIL PO Take 1 Can by mouth daily.   levofloxacin 500 MG tablet Commonly known as:  LEVAQUIN Take 1 tablet (500 mg total) by mouth daily. For 10 days   levothyroxine 50 MCG tablet Commonly known as:  SYNTHROID, LEVOTHROID Take 1 tablet (50 mcg total) by mouth daily before breakfast. Wait 1 hour after taking before eating or drinking anything but water.   NIFEdipine 30 MG 24 hr tablet Commonly known as:   PROCARDIA-XL/ADALAT-CC/NIFEDICAL-XL Take 1 tablet (30 mg total) by mouth daily.   SYMBICORT 160-4.5 MCG/ACT inhaler Generic drug:  budesonide-formoterol INHALE 2 PUFFS TWICE A DAY   valsartan-hydrochlorothiazide 320-25 MG tablet Commonly known as:  DIOVAN-HCT TAKE 1 TABLET BY MOUTH DAILY.   vitamin B-12 250 MCG tablet Commonly known as:  CYANOCOBALAMIN Take 250 mcg by mouth daily.        Follow-up: No Follow-up on file.  Claretta Fraise, M.D.

## 2016-11-05 ENCOUNTER — Telehealth: Payer: Self-pay | Admitting: Internal Medicine

## 2016-11-05 NOTE — Telephone Encounter (Signed)
Spoke with pt, who states he seen his PCP on 11/02/16 for COPD exacerbation. Pt was prescribed levaquin 500 x10d and given celestone 6mg  injection in office. Pt is scheduled for OV and PFT on 11/07/16. Pt is wanting to know if PFT should be rescheduled until symptoms improve.   MW please advise. Thanks.   09/13/16 Instructions   No change in medications  Most important aspect of care is to maintain off cigarettes  Please schedule a follow up office visit in 6 weeks, call sooner if needed with pfts on return

## 2016-11-05 NOTE — Telephone Encounter (Signed)
Left message for patient to call back  

## 2016-11-05 NOTE — Telephone Encounter (Signed)
...  correction...Frank Rosales (not MR..sorry for typo)  shelby

## 2016-11-05 NOTE — Telephone Encounter (Signed)
If he's feeling really bad that day and can't do the study, ok to cancel but still see me.  Most likely will turn the corner by then and go ahead keep both appts

## 2016-11-06 NOTE — Telephone Encounter (Signed)
Called and spoke to pt. Informed him of the recs per MW. Pt verbalized understanding and requested to go ahead and cancel PFT. PFT appt has been canceled. Pt aware to keep appt with MW on 6.27.18. Nothing further needed at this time.

## 2016-11-06 NOTE — Telephone Encounter (Signed)
lmtcb x2 for pt. 

## 2016-11-06 NOTE — Telephone Encounter (Signed)
Patient returning call - he can be reached at [phone] -pr

## 2016-11-07 ENCOUNTER — Encounter: Payer: Self-pay | Admitting: Internal Medicine

## 2016-11-07 ENCOUNTER — Ambulatory Visit (INDEPENDENT_AMBULATORY_CARE_PROVIDER_SITE_OTHER): Payer: 59 | Admitting: Internal Medicine

## 2016-11-07 VITALS — BP 132/74 | HR 88 | Temp 98.0°F | Ht 69.0 in | Wt 187.0 lb

## 2016-11-07 DIAGNOSIS — J61 Pneumoconiosis due to asbestos and other mineral fibers: Secondary | ICD-10-CM

## 2016-11-07 DIAGNOSIS — J449 Chronic obstructive pulmonary disease, unspecified: Secondary | ICD-10-CM

## 2016-11-07 MED ORDER — PREDNISONE 10 MG PO TABS: ORAL_TABLET | ORAL | 0 refills | Status: DC

## 2016-11-07 MED ORDER — TRAMADOL HCL 50 MG PO TABS: ORAL_TABLET | ORAL | 0 refills | Status: DC

## 2016-11-07 NOTE — Progress Notes (Signed)
Subjective:    Patient ID: Frank Rosales, male    DOB: [birth year]    MRN: [id number]    Brief patient profile:  61 yowm quit smoking 08/12/16  with h/o exposure to Asbestosis at [facility] ? Into the 80s  still able to do flat fine but steps x 3 flights sob with GOLD III copd 2013 prev eval by Dr Frank Rosales  s/p admit      History of Present Illness  02/16/2016  f/u ov/Frank Rosales re:  GOLD III copd /  symbicort 160 2bid / twice weekly saba  Chief Complaint  Patient presents with  . Follow-up    Pt states had CT Chest done 12/27/15- ordered by worker's comp for eval of previous asbestos exp. He states he had been doing well until July 2017 developed cough and congestion that lasted 2 months, but starting to improve.   sick July - September 2017 rx with abx/ steroids better since early Sept 2017 but CT was done in middle of this illness and was abnormal but was done for purpose of screening for asbestos  rec Plan A = Automatic = symbicort 160 Take 2 puffs first thing in am and then another 2 puffs about 12 hours later.  Work on inhaler technique:   Only use your albuterol(proair)  as a rescue medication  The key is to stop smoking completely before smoking completely stops you!    Admit date: 08/20/2016 Discharge date: 08/30/2016  Admitting Diagnosis: Left hemothorax Atelectasis  Discharge Diagnosis     Patient Active Problem List   Diagnosis Date Noted  . Hemothorax on left 08/21/2016  . Rib fractures 08/05/2016  . Asbestosis ([place]) 02/16/2016  . Sleep disturbance 07/06/2015  . Alcohol abuse 10/19/2014  . GAD (generalized anxiety disorder) 07/01/2014  . Hypothyroidism 12/03/2013  . Osteopenia 03/11/2013  . Cigarette smoker 07/12/2011  . Essential hypertension   . COPD GOLD III    . High cholesterol   . Hilar adenopathy     Consultants Frank Charon MD - cardiothoracic surgery  Imaging:  ImagingResults(Last48hours)  Dg Chest 2 View  Result Date:  08/29/2016 CLINICAL DATA:  Several days of wheezing. Left lung surgery for drainage of a hemothorax 1 week ago EXAM: CHEST  2 VIEW COMPARISON:  Chest x-ray of August 28, 2016 FINDINGS: The right lung is mildly hyperinflated and clear. On the left there is mild stable volume loss. There is a persistent loculated left-sided hydropneumothorax. No apical pneumothorax is evident. There is stable density in the left suprahilar region extending into the apex. There are known left lateral rib fractures. The heart and pulmonary vascularity are normal. The mediastinum is normal in width. IMPRESSION: Stable appearance of the loculated left basilar hydropneumothorax. No significant change in the appearance of the chest since yesterday's study. Electronically Signed   By: Frank  Rosales M.D.   On: 08/29/2016 10:03    DG chest 2 view 08/28/16: 1. Loculated hydropneumothorax at the left lung base remain stable. Decreased non dependent left pneumothorax since yesterday. 2. Postoperative changes and patchy opacity also in the left mid and upper lung. 3. No new cardiopulmonary abnormality.  DG chest 1V repeat same day 08/27/16: 1. Interval removal of the left-sided chest tube. Persistent left basilar low loculated pneumothorax.  CT angio chest PE w or wo contrast 08/21/16: 1. Significant interval increase and moderate to large loculated right-sided pleural effusion. New areas of increased attenuation within the effusion, suspicious for interval hemorrhage into the left hemithorax since the prior  study. Underlying atelectasis noted. Infection cannot be entirely excluded, though there is no evidence of empyema at this time. 2. Fractures of the left lateral fifth through eighth ribs again noted, with  comminution at the left sixth and seventh ribs. Associated soft tissue hemorrhage along the left chest wall, with residual soft tissue air along the left chest wall. 3. Mild right basilar atelectasis. Scattered peripheral blebs  at the lung apices. 4. 1.5 cm azygoesophageal recess node noted. 5. Chronic compression deformities of vertebral bodies T12 and L1, with changes of vertebroplasty at L1.  Procedures Dr. Roxan Rosales (08/22/16) - Left thoracotomy, evacuation of hemothorax and decortication, plating of 6th and 7th ribs  Hospital Course:  Frank Rosales is a 61yo male PMH COPD who [facility] to [facility] 08/20/16 after a recent admission forground level fall on 08/05/16 where he sustained multiple left-sided rib fractures. Discharged 08/09/16 in good condition.  Returned to ED 4/9 complaining of acute worsening left sided chest pain. Workup showed a large left pleural effusion/hemothorax.  Patient was admitted for pain control and further workup/treatment. Cardiothoracic surgery consulted and recommended VATS/thoracotomy for loculated hemothorax.  Patient underwent this procedure on 4/11. Tolerated procedure well and was transferred to the ICU for monitoring. Patient was instructed to use incentive spirometer and flutter valve regularly, and he was on dulera BID and duonebs TID. Diet was advanced as tolerated.  CXR on 4/14 was stable and showed no pneumothorax, therefore the posterior chest tube was removed. CXR on 4/16 was unchanged and remaining chest tube was removed. He did have increased wheezing on 4/18, but this responded well to increasing inhaler use to QID. On 4/19 his CXR remained stable, O2 sats stable on room air, the patient was voiding well, tolerating diet, ambulating well, pain well controlled, vital signs stable, incisions c/d/i and felt stable for discharge home.  Patient will follow up with Dr. Roxan Rosales as below, and knows to call with questions or concerns.     09/13/2016  f/u ov/Frank Rosales re: post hosp f/u - no longer smoking / symb 160 2bid maint Chief Complaint  Patient presents with  . Follow-up    Pt states fell and broke 4 ribs approx 1 month ago and developed hemothorax. He had surgery done by Dr.  Roxan Rosales. He has occ chest discomfort uponn inspiration. He is coughing some with clear to green sputum.  He has not needed albuterol in the past few wks.   overall improving back toward  baseline, minimally discolored am mucus Doe = MMRC3 = can't walk 100 yards even at a slow pace at a flat grade s stopping due to sob   rec No change rx Return for pfts      11/07/2016  f/u ov/Son Barkan re: acute cough - maintained on symb 160 2bid  Chief Complaint  Patient presents with  . Follow-up    Pt c/o chest and nasal congestion, cough, increased SOB for the past wk. His cough is prod with very thick, clear sputum. He states "feels like I'm drowning"-seen by PCP on 6/22 and given steroid inj and abx with no relief.   acute onset with sore throat resolved w/in first day but then nasal / congestion rx by Dr Quinn Axe on 11/02/16 with pred/levaquin Mucus is now clear  Using saba 2puffs  4 x daily    No obvious patterns to day to day or daytime variability or assoc   mucus plugs or hemoptysis or cp or chest tightness, subjective wheeze or overt sinus or hb  symptoms. No unusual exp hx or h/o childhood pna/ asthma or knowledge of premature birth.  Sleeping ok flat without nocturnal  or early am exacerbation  of respiratory  c/o's or need for noct saba. Also denies any obvious fluctuation of symptoms with weather or environmental changes or other aggravating or alleviating factors except as outlined above   Current Allergies, Complete Past Medical History, Past Surgical History, Family History, and Social History were reviewed in [employer] record.  ROS  The following are not active complaints unless bolded sore throat, dysphagia, dental problems, itching, sneezing,  nasal congestion or disharge of excess mucus or purulent secretions, ear ache,   fever, chills, sweats, unintended wt loss or wt gain, classically pleuritic or exertional cp,  orthopnea pnd or leg swelling, presyncope,  palpitations, abdominal pain, anorexia, nausea, vomiting, diarrhea  or change in bowel habits or bladder habits, change in stools or change in urine, dysuria, hematuria,  rash, arthralgias, visual complaints, headache, numbness, weakness or ataxia or problems with walking or coordination,  change in mood/affect or memory.        Current Meds  Medication Sig  . acetaminophen (TYLENOL) 325 MG tablet Take 2 tablets (650 mg total) by mouth every 6 (six) hours as needed.  Marland Kitchen albuterol (PROAIR HFA) 108 (90 Base) MCG/ACT inhaler Inhale 2 puffs into the lungs every 6 (six) hours.  Marland Kitchen aspirin 81 MG tablet Take 81 mg by mouth daily.   . cholecalciferol (VITAMIN D) 1000 UNITS tablet Take 1,000 Units by mouth daily.   . folic acid (FOLVITE) 1 MG tablet Take 1 tablet (1 mg total) by mouth daily.  Marland Kitchen ibuprofen (ADVIL,MOTRIN) 400 MG tablet Take 1 tablet (400 mg total) by mouth every 4 (four) hours as needed.  . Ibuprofen-Diphenhydramine HCl (ADVIL PM) 200-25 MG CAPS Take 1 capsule by mouth at bedtime as needed (for sleep).  . SYMBICORT 160-4.5 MCG/ACT inhaler INHALE 2 PUFFS TWICE A DAY  . valsartan-hydrochlorothiazide (DIOVAN-HCT) 320-25 MG tablet TAKE 1 TABLET BY MOUTH DAILY.  . vitamin B-12 (CYANOCOBALAMIN) 250 MCG tablet Take 250 mcg by mouth daily.  . [DISCONTINUED] KRILL OIL PO Take 1 Can by mouth daily.  . [DISCONTINUED] levofloxacin (LEVAQUIN) 500 MG tablet Take 1 tablet (500 mg total) by mouth daily. For 10 days  . [DISCONTINUED] levothyroxine (SYNTHROID, LEVOTHROID) 50 MCG tablet Take 1 tablet (50 mcg total) by mouth daily before breakfast. Wait 1 hour after taking before eating or drinking anything but water.  . [DISCONTINUED] NIFEdipine (PROCARDIA-XL/ADALAT-CC/NIFEDICAL-XL) 30 MG 24 hr tablet Take 1 tablet (30 mg total) by mouth daily.                  Objective:   Physical Exam  amb minimally  hoarse wm   nad    01/24/2015        180 > 02/21/2015  180  > 06/16/2015  186  > 02/16/2016  205 >  09/13/2016   185 >  11/07/2016  187     11/10/14 174 lb (78.926 kg)  11/08/14 176 lb 6.4 oz (80.015 kg)  10/27/14 175 lb (79.379 kg)    Vital signs reviewed  - sats 94% RA on arrival   Gen: Pleasant, well-nourished, in no distress,  normal affect  ENT: No lesions,  mouth clear,  oropharynx clear, no postnasal drip  Neck: No JVD, no TMG, no carotid bruits  Lungs: No use of accessory muscles, no dullness to percussion, distant BS bilaterally/ min exp rhonchi  Cardiovascular: RRR, heart sounds normal, no murmur or gallops, no peripheral edema  Abdomen: soft and NT, no HSM,  BS normal  Musculoskeletal: No deformities, no cyanosis or clubbing  Neuro: alert, non focal  Skin: Warm, no lesions or rashes     I personally reviewed images and agree with radiology impression as follows:  CXR:   11/02/16 Similar appearance of the chest x-ray to prior, with persisting fluid/ scarring at the left lung base and no evidence of pneumothorax.    Assessment & Plan:

## 2016-11-07 NOTE — Patient Instructions (Addendum)
Plan A = Automatic = Symbicort 160  Take 2 puffs first thing in am and then another 2 puffs about 12 hours later.   Plan B = Backup Only use your albuterol as a rescue medication to be used if you can't catch your breath by resting or doing a relaxed purse lip breathing pattern.  - The less you use it, the better it will work when you need it. - Ok to use the inhaler up to 2 puffs  every 4 hours if you must but call for appointment if use goes up over your usual need - Don't leave home without it !!  (think of it like the spare tire for your car)    Prednisone 10 mg take  4 each am x 2 days,   2 each am x 2 days,  1 each am x 2 days and stop   Try prilosec otc 20mg   Take 30-60 min before first meal of the day and Pepcid ac (famotidine) 20 mg one @  bedtime until cough is completely gone for at least a week without the need for cough suppression and stop the krill oil for the same reason until no cough at all    For cough /congestion > mucinex dm up to 1200 mg every 12 hours as needed and tramadol 50 mg up to 2 every 4 hours   For nasal congestion;  advil cold and sinus congestion every 4 hours as needed    Please schedule a follow up office visit in 6 weeks, call sooner if needed with pfts

## 2016-11-27 ENCOUNTER — Telehealth: Payer: Self-pay | Admitting: Family Medicine

## 2016-11-27 NOTE — Telephone Encounter (Signed)
Increase the nifedipine to 60 mg, that's 2 tablets a day. They can be taken at the same time. That should do the trick for his blood pressure.

## 2016-11-27 NOTE — Telephone Encounter (Signed)
After pt decreased BP med to 30mg , diastolic number is staying in the 90's Please review and advise

## 2016-11-28 ENCOUNTER — Other Ambulatory Visit: Payer: Self-pay | Admitting: *Deleted

## 2016-11-28 MED ORDER — NIFEDIPINE ER OSMOTIC RELEASE 60 MG PO TB24: 60.0000 mg | ORAL_TABLET | Freq: Every day | ORAL | 1 refills | Status: DC

## 2016-11-28 NOTE — Telephone Encounter (Signed)
Pt notified of dose change RX sent into [facility]

## 2016-11-28 NOTE — Telephone Encounter (Signed)
Pt requesting RX for Nifedipine sta60mg  tabs Is this ok to send in?

## 2016-11-28 NOTE — Telephone Encounter (Signed)
Yes, fill for 6 months for 60 mg of the extended release at one a day

## 2016-12-21 ENCOUNTER — Ambulatory Visit (INDEPENDENT_AMBULATORY_CARE_PROVIDER_SITE_OTHER): Payer: 59 | Admitting: Internal Medicine

## 2016-12-21 ENCOUNTER — Ambulatory Visit (INDEPENDENT_AMBULATORY_CARE_PROVIDER_SITE_OTHER)
Admission: RE | Admit: 2016-12-21 | Discharge: 2016-12-21 | Disposition: A | Discharge status: Home / Self Care | Payer: 59 | Source: Ambulatory Visit | Attending: Internal Medicine | Admitting: Internal Medicine

## 2016-12-21 ENCOUNTER — Encounter: Payer: Self-pay | Admitting: Internal Medicine

## 2016-12-21 VITALS — BP 124/84 | HR 78 | Ht 69.5 in | Wt 195.0 lb

## 2016-12-21 DIAGNOSIS — J449 Chronic obstructive pulmonary disease, unspecified: Secondary | ICD-10-CM

## 2016-12-21 LAB — PULMONARY FUNCTION TEST
DL/VA % PRED: 85 %
DL/VA: 3.92 ml/min/mmHg/L
DLCO COR: 21.98 ml/min/mmHg
DLCO UNC % PRED: 71 %
DLCO cor % pred: 69 %
DLCO unc: 22.75 ml/min/mmHg
FEF 25-75 Post: 0.51 L/sec
FEF 25-75 Pre: 0.47 L/sec
FEF2575-%CHANGE-POST: 8 %
FEF2575-%PRED-POST: 17 %
FEF2575-%PRED-PRE: 15 %
FEV1-%Change-Post: 8 %
FEV1-%Pred-Post: 39 %
FEV1-%Pred-Pre: 35 %
FEV1-Post: 1.4 L
FEV1-Pre: 1.29 L
FEV1FVC-%Change-Post: 8 %
FEV1FVC-%Pred-Pre: 61 %
FEV6-%Change-Post: 2 %
FEV6-%Pred-Post: 58 %
FEV6-%Pred-Pre: 57 %
FEV6-POST: 2.65 L
FEV6-Pre: 2.58 L
FEV6FVC-%Change-Post: 3 %
FEV6FVC-%PRED-POST: 100 %
FEV6FVC-%Pred-Pre: 97 %
FVC-%Change-Post: 0 %
FVC-%PRED-PRE: 58 %
FVC-%Pred-Post: 58 %
FVC-POST: 2.78 L
FVC-PRE: 2.77 L
PRE FEV1/FVC RATIO: 47 %
Post FEV1/FVC ratio: 50 %
Post FEV6/FVC ratio: 97 %
Pre FEV6/FVC Ratio: 93 %
RV % pred: 139 %
RV: 3.05 L
TLC % pred: 93 %
TLC: 6.47 L

## 2016-12-21 NOTE — Patient Instructions (Addendum)
Please remember to go to the  x-ray department downstairs in the basement  for your tests - we will call you with the results when they are available.      Please schedule a follow up visit in 3 months but call sooner if needed  

## 2016-12-21 NOTE — Progress Notes (Signed)
PFT done today. 

## 2016-12-21 NOTE — Progress Notes (Signed)
Subjective:    Patient ID: KNOWLEDGE ESCANDON, male    DOB: 31-Jan-1956    MRN: [id number]    Brief patient profile:  53 yowm quit smoking 08/12/16  with h/o exposure to Asbestosis at [facility] ? Into the 80s  still able to walk  flat fine but steps x 3 flights sob with GOLD III copd 2013 prev eval by Dr Joya Gaskins       History of Present Illness  02/16/2016  f/u ov/Wert re:  GOLD III copd /  symbicort 160 2bid / twice weekly saba  Chief Complaint  Patient presents with  . Follow-up    Pt states had CT Chest done 12/27/15- ordered by worker's comp for eval of previous asbestos exp. He states he had been doing well until July 2017 developed cough and congestion that lasted 2 months, but starting to improve.   sick July - September 2017 rx with abx/ steroids better since early Sept 2017 but CT was done in middle of this illness and was abnormal but was done for purpose of screening for asbestos  rec Plan A = Automatic = symbicort 160 Take 2 puffs first thing in am and then another 2 puffs about 12 hours later.  Work on inhaler technique:   Only use your albuterol(proair)  as a rescue medication  The key is to stop smoking completely before smoking completely stops you!    Admit date: 08/20/2016 Discharge date: 08/30/2016  Admitting Diagnosis: Left hemothorax Atelectasis  Discharge Diagnosis     Patient Active Problem List   Diagnosis Date Noted  . Hemothorax on left 08/21/2016  . Rib fractures 08/05/2016  . Asbestosis ([place]) 02/16/2016  . Sleep disturbance 07/06/2015  . Alcohol abuse 10/19/2014  . GAD (generalized anxiety disorder) 07/01/2014  . Hypothyroidism 12/03/2013  . Osteopenia 03/11/2013  . Cigarette smoker 07/12/2011  . Essential hypertension   . COPD GOLD III    . High cholesterol   . Hilar adenopathy     Consultants Modesto Charon MD - cardiothoracic surgery  Imaging:  ImagingResults(Last48hours)  Dg Chest 2 View  Result Date: 08/29/2016 CLINICAL  DATA:  Several days of wheezing. Left lung surgery for drainage of a hemothorax 1 week ago EXAM: CHEST  2 VIEW COMPARISON:  Chest x-ray of August 28, 2016 FINDINGS: The right lung is mildly hyperinflated and clear. On the left there is mild stable volume loss. There is a persistent loculated left-sided hydropneumothorax. No apical pneumothorax is evident. There is stable density in the left suprahilar region extending into the apex. There are known left lateral rib fractures. The heart and pulmonary vascularity are normal. The mediastinum is normal in width. IMPRESSION: Stable appearance of the loculated left basilar hydropneumothorax. No significant change in the appearance of the chest since yesterday's study. Electronically Signed   By: David  Martinique M.D.   On: 08/29/2016 10:03    DG chest 2 view 08/28/16: 1. Loculated hydropneumothorax at the left lung base remain stable. Decreased non dependent left pneumothorax since yesterday. 2. Postoperative changes and patchy opacity also in the left mid and upper lung. 3. No new cardiopulmonary abnormality.  DG chest 1V repeat same day 08/27/16: 1. Interval removal of the left-sided chest tube. Persistent left basilar low loculated pneumothorax.  CT angio chest PE w or wo contrast 08/21/16: 1. Significant interval increase and moderate to large loculated right-sided pleural effusion. New areas of increased attenuation within the effusion, suspicious for interval hemorrhage into the left hemithorax since the prior study.  Underlying atelectasis noted. Infection cannot be entirely excluded, though there is no evidence of empyema at this time. 2. Fractures of the left lateral fifth through eighth ribs again noted, with  comminution at the left sixth and seventh ribs. Associated soft tissue hemorrhage along the left chest wall, with residual soft tissue air along the left chest wall. 3. Mild right basilar atelectasis. Scattered peripheral blebs at the lung  apices. 4. 1.5 cm azygoesophageal recess node noted. 5. Chronic compression deformities of vertebral bodies T12 and L1, with changes of vertebroplasty at L1.  Procedures Dr. [name] (08/22/16) - Left thoracotomy, evacuation of hemothorax and decortication, plating of 6th and 7th ribs  Hospital Course:  GREGREY BLOYD is a 61yo male PMH COPD who returned to [facility] 08/20/16 after a recent admission forground level fall on 08/05/16 where he sustained multiple left-sided rib fractures. Discharged 08/09/16 in good condition.  Returned to ED 4/9 complaining of acute worsening left sided chest pain. Workup showed a large left pleural effusion/hemothorax.  Patient was admitted for pain control and further workup/treatment. Cardiothoracic surgery consulted and recommended VATS/thoracotomy for loculated hemothorax.  Patient underwent this procedure on 4/11. Tolerated procedure well and was transferred to the ICU for monitoring. Patient was instructed to use incentive spirometer and flutter valve regularly, and he was on dulera BID and duonebs TID. Diet was advanced as tolerated.  CXR on 4/14 was stable and showed no pneumothorax, therefore the posterior chest tube was removed. CXR on 4/16 was unchanged and remaining chest tube was removed. He did have increased wheezing on 4/18, but this responded well to increasing inhaler use to QID. On 4/19 his CXR remained stable, O2 sats stable on room air, the patient was voiding well, tolerating diet, ambulating well, pain well controlled, vital signs stable, incisions c/d/i and felt stable for discharge home.  Patient will follow up with Dr. [name] as below, and knows to call with questions or concerns.     09/13/2016  f/u ov/Wert re: post hosp f/u - no longer smoking / symb 160 2bid maint Chief Complaint  Patient presents with  . Follow-up    Pt states fell and broke 4 ribs approx 1 month ago and developed hemothorax. He had surgery done by Dr. [name]. He  has occ chest discomfort uponn inspiration. He is coughing some with clear to green sputum.  He has not needed albuterol in the past few wks.   overall improving back toward  baseline, minimally discolored am mucus Doe = MMRC3 = can't walk 100 yards even at a slow pace at a flat grade s stopping due to sob   rec No change rx Return for pfts    11/07/2016  f/u ov/Wert re: acute cough - maintained on symb 160 2bid  Chief Complaint  Patient presents with  . Follow-up    Pt c/o chest and nasal congestion, cough, increased SOB for the past wk. His cough is prod with very thick, clear sputum. He states "feels like I'm drowning"-seen by PCP on 6/22 and given steroid inj and abx with no relief.   acute onset with sore thorat gone w/in first day but then nasal / congestion rx by Dr Quinn Axe on 11/02/16 with pred/levaquin Mucus is now clear  Using saba 2 4 x daily  rec Plan A = Automatic = Symbicort 160  Take 2 puffs first thing in am and then another 2 puffs about 12 hours later.  Plan B = Backup Only use your albuterol as a  rescue medication Prednisone 10 mg take  4 each am x 2 days,   2 each am x 2 days,  1 each am x 2 days and stop  Try prilosec otc 20mg   Take 30-60 min before first meal of the day and Pepcid ac (famotidine) 20 mg one @  bedtime until cough is completely gone for at least a week without the need for cough suppression and stop the krill oil for the same reason until no cough at all  For cough /congestion > mucinex dm up to 1200 mg every 12 hours as needed and tramadol 50 mg up to 2 every 4 hours  For nasal congestion;  advil cold and sinus congestion every 4 hours as needed     12/21/2016  f/u ov/Wert re:  Copd gold III/AB  symb 160 2bid  Chief Complaint  Patient presents with  . Follow-up    PFT's done today. His breathing is doing well. He rarely uses albuterol. He c/o occ PND and cough with clear sputum.    doe now = MMRC2 = can't walk a nl pace on a flat grade s sob but does  fine slow and flat      No obvious day to day or daytime variability or assoc excess/ purulent sputum or mucus plugs or hemoptysis or cp or chest tightness, subjective wheeze or overt sinus or hb symptoms. No unusual exp hx or h/o childhood pna/ asthma or knowledge of premature birth.  Sleeping ok without nocturnal  or early am exacerbation  of respiratory  c/o's or need for noct saba. Also denies any obvious fluctuation of symptoms with weather or environmental changes or other aggravating or alleviating factors except as outlined above   Current Medications, Allergies, Complete Past Medical History, Past Surgical History, Family History, and Social History were reviewed in [employer] record.  ROS  The following are not active complaints unless bolded sore throat, dysphagia, dental problems, itching, sneezing,  nasal congestion or excess/ purulent secretions, ear ache,   fever, chills, sweats, unintended wt loss, classically pleuritic or exertional cp,  orthopnea pnd or leg swelling, presyncope, palpitations, abdominal pain, anorexia, nausea, vomiting, diarrhea  or change in bowel or bladder habits, change in stools or urine, dysuria,hematuria,  rash, arthralgias, visual complaints, headache, numbness, weakness or ataxia or problems with walking or coordination,  change in mood/affect or memory.                       Objective:   Physical Exam  amb wm nad    01/24/2015        180 > 02/21/2015  180  > 06/16/2015  186  > 02/16/2016  205 > 09/13/2016   185 >  11/07/2016  187 >  12/21/2016  195     11/10/14 174 lb (78.926 kg)  11/08/14 176 lb 6.4 oz (80.015 kg)  10/27/14 175 lb (79.379 kg)    Vital signs reviewed  -   - Note on arrival 02 sats  96% on RA    Gen: Pleasant, well-nourished, in no distress,  normal affect  ENT: No lesions,  mouth clear,  oropharynx clear, no postnasal drip  Neck: No JVD, no TMG, no carotid bruits  Lungs: No use of accessory muscles,  no dullness to percussion, distant wheeze bilaterally insp and exp   Cardiovascular: RRR, heart sounds normal, no murmur or gallops, no peripheral edema  Abdomen: obese/ soft and NT, no HSM,  BS normal  Musculoskeletal: No deformities, no cyanosis or clubbing  Neuro: alert, non focal  Skin: Warm, no lesions or rashes     CXR PA and Lateral:   12/21/2016 :    I personally reviewed images and agree with radiology impression as follows:   No acute cardiopulmonary findings. Chronic lung changes and chronic remote posttraumatic changes involving the left hemithorax.     Assessment & Plan:

## 2016-12-22 ENCOUNTER — Encounter: Payer: Self-pay | Admitting: Internal Medicine

## 2016-12-22 NOTE — Assessment & Plan Note (Signed)
Spirometry 03/2011 FeV1 49%    Spirometry  02/28/2012  FEV1 45%   - Last day worked = October 13 2014  - PFT's  01/24/2015  FEV1 1.59  (43 % ) ratio 54   p 22 % improvement from saba with DLCO  88 % corrects to 99 % for alv volume s symbicort  - PFTs  12/27/15 1.30 (35%) ratio 35  In midst of a flare- - quit smoking 08/12/16  - 11/07/2016  After extensive coaching HFA effectiveness =    90%   - PFT's  12/21/2016  FEV1 1.40 (39 % ) ratio 50  p 8 % improvement from saba p symbicort 160 x 2  prior to study with DLCO  71/69 % corrects to 85  % for alv volume  He continues to have significant wheeze on exam suggesting AB component > symb best choice  Each maintenance medication was reviewed in detail including most importantly the difference between maintenance and as needed and under what circumstances the prns are to be used.  Please see AVS for specific  Instructions which are unique to this visit and I personally typed out  which were reviewed in detail in writing with the patient and a copy provided.

## 2016-12-25 ENCOUNTER — Telehealth: Payer: Self-pay | Admitting: Internal Medicine

## 2016-12-25 NOTE — Telephone Encounter (Signed)
Pt is aware of results and no further questions or concerns at this time.  

## 2016-12-25 NOTE — Telephone Encounter (Signed)
Notes recorded by Len Blalock, CMA on 12/24/2016 at 2:03 PM EDT lmtcb X1 for pt to relay results/recs.  ------  Notes recorded by Tanda Rockers, MD on 12/22/2016 at 5:55 AM EDT Call pt: Reviewed cxr and no acute change so no change in recommendations made at [facility]  [facility]

## 2016-12-25 NOTE — Telephone Encounter (Signed)
Pt returning Frank Rosales's call °

## 2017-01-03 NOTE — Addendum Note (Signed)
Addendum  created 01/03/17 1255 by Roberts Gaudy, MD   Sign clinical note

## 2017-01-22 ENCOUNTER — Telehealth: Payer: Self-pay | Admitting: Family Medicine

## 2017-01-22 NOTE — Telephone Encounter (Signed)
Patient aware he will need to be seen. States that he will try a different OTC medication

## 2017-01-22 NOTE — Telephone Encounter (Signed)
Pt. Needs to be seen for this. Thanks, WS 

## 2017-01-27 ENCOUNTER — Other Ambulatory Visit: Payer: Self-pay | Admitting: Family Medicine

## 2017-02-01 NOTE — Assessment & Plan Note (Signed)
Last exp known ? 1980s wore respirator thereafter when known exp likely  - CT chest 12/27/15 ? Pleural scarring developing in bases > f/u planned by Asbestos lawyer   Reviewed last cxr and re-emphasized avoidance of all cig smoke as the only way to reduce risk of lung ca which will be much higher in this setting

## 2017-02-01 NOTE — Assessment & Plan Note (Signed)
Spirometry 03/2011 FeV1 49%    Spirometry  02/28/2012  FEV1 45%   - Last day worked = October 13 2014  - PFT's  01/24/2015  FEV1 1.59  (43 % ) ratio 54   p 22 % improvement from saba with DLCO  88 % corrects to 99 % for alv volume s symbicort  - PFTs  12/27/15 1.30 (35%) ratio 35  In midst of a flare- - quit smoking 08/12/16   - 11/07/2016  After extensive coaching HFA effectiveness =    90%   Improved s/p aecopd already approp rx by PCP but needs to understand /follow contingencies better so I outlined them again today using an abc format but no change in rx needed   I had an extended discussion with the patient reviewing all relevant studies completed to date and  lasting 15 to 20 minutes of a 25 minute visit    Each maintenance medication was reviewed in detail including most importantly the difference between maintenance and prns and under what circumstances the prns are to be triggered using an action plan format that is not reflected in the computer generated alphabetically organized AVS.    Please see AVS for specific instructions unique to this visit that I personally wrote and verbalized to the the pt in detail and then reviewed with pt  by my nurse highlighting any  changes in therapy recommended at today's visit to their plan of care.

## 2017-02-04 ENCOUNTER — Ambulatory Visit (INDEPENDENT_AMBULATORY_CARE_PROVIDER_SITE_OTHER): Payer: 59 | Admitting: Family Medicine

## 2017-02-04 ENCOUNTER — Encounter: Payer: Self-pay | Admitting: Family Medicine

## 2017-02-04 VITALS — BP 117/77 | HR 80 | Temp 98.0°F | Ht 69.5 in | Wt 200.0 lb

## 2017-02-04 DIAGNOSIS — R7309 Other abnormal glucose: Secondary | ICD-10-CM

## 2017-02-04 DIAGNOSIS — Z125 Encounter for screening for malignant neoplasm of prostate: Secondary | ICD-10-CM

## 2017-02-04 DIAGNOSIS — J449 Chronic obstructive pulmonary disease, unspecified: Secondary | ICD-10-CM | POA: Diagnosis not present

## 2017-02-04 LAB — BAYER DCA HB A1C WAIVED: HB A1C: 5.5 % (ref <7.0)

## 2017-02-04 MED ORDER — VALSARTAN-HYDROCHLOROTHIAZIDE 320-25 MG PO TABS: 1.0000 | ORAL_TABLET | Freq: Every day | ORAL | 1 refills | Status: DC

## 2017-02-04 MED ORDER — BUDESONIDE-FORMOTEROL FUMARATE 160-4.5 MCG/ACT IN AERO: 2.0000 | INHALATION_SPRAY | Freq: Two times a day (BID) | RESPIRATORY_TRACT | 1 refills | Status: DC

## 2017-02-04 NOTE — Progress Notes (Signed)
Subjective:  Patient ID: Frank Rosales, male    DOB: [birth year]  Age: 61 y.o. MRN: [id number]  CC: COPD (pt here today for routine follow up of his chronic medical conditions)   HPI TOY EISEMANN presents for Still gets short of breath with exertion. Appetite is good and he is putting on some weight. He is not exercising regularly due to the shortness of breath but he can walk he just has gotten out of the habit. Denies new thyroid symptoms. Stable on current levothyroxine dose.   follow-up of hypertension. Patient has no history of headache chest pain or shortness of breath or recent cough. Patient also denies symptoms of TIA such as numbness weakness lateralizing. Patient checks  blood pressure at home and has not had any elevated readings recently. Patient denies side effects from his medication. States taking it regularly.   Depression screen [facility] 2/9 02/04/2017 11/02/2016 09/13/2016  Decreased Interest 0 0 0  Down, Depressed, Hopeless 0 0 0  PHQ - 2 Score 0 0 0  Altered sleeping - - -  Tired, decreased energy - - -  Change in appetite - - -  Feeling bad or failure about yourself  - - -  Trouble concentrating - - -  Moving slowly or fidgety/restless - - -  Suicidal thoughts - - -  PHQ-9 Score - - -    History Random has a past medical history of Anxiety; Arthritis; Asthma; Colon polyps; COPD (chronic obstructive pulmonary disease) ([place]); High blood pressure; High cholesterol; Hilar adenopathy; Hypothyroidism; and Multiple rib fractures (08/2016).   He has a past surgical history that includes Knee arthroscopy (Right, 05/14/2010); Wisdom tooth extraction; epidural injections; Fixation kyphoplasty lumbar spine; Back surgery; Video assisted thoracoscopy (Left, 08/22/2016); Pleural effusion drainage (Left, 08/22/2016); and Rib plating (Left, 08/22/2016).   His family history includes Emphysema in his father; Heart disease in his brother; Heart failure in his mother; Lung cancer in his  father.He reports that he quit smoking about 5 months ago. His smoking use included Cigarettes. He started smoking about 43 years ago. He has a 70.00 pack-year smoking history. He has never used smokeless tobacco. He reports that he drinks about 25.2 oz of alcohol per week . He reports that he does not use drugs.    ROS Review of Systems  Constitutional: Positive for fatigue. Negative for chills, diaphoresis, fever and unexpected weight change.  HENT: Negative for congestion, hearing loss, rhinorrhea and sore throat.   Eyes: Negative for visual disturbance.  Respiratory: Positive for shortness of breath. Negative for cough.   Cardiovascular: Negative for chest pain.  Gastrointestinal: Negative for abdominal pain, constipation and diarrhea.  Genitourinary: Negative for dysuria and flank pain.  Musculoskeletal: Negative for arthralgias and joint swelling.  Skin: Negative for rash.  Neurological: Positive for weakness (Nonfocal). Negative for dizziness and headaches.  Psychiatric/Behavioral: Negative for dysphoric mood and sleep disturbance.    Objective:  BP 117/77   Pulse 80   Temp 98 F (36.7 C) (Oral)   Ht 5' 9.5" (1.765 m)   Wt 200 lb (90.7 kg)   BMI 29.11 kg/m   BP Readings from Last 3 Encounters:  02/04/17 117/77  12/21/16 124/84  11/07/16 132/74    Wt Readings from Last 3 Encounters:  02/04/17 200 lb (90.7 kg)  12/21/16 195 lb (88.5 kg)  11/07/16 187 lb (84.8 kg)     Physical Exam  Constitutional: He is oriented to person, place, and time. He appears well-developed and  well-nourished. No distress.  HENT:  Head: Normocephalic and atraumatic.  Right Ear: External ear normal.  Left Ear: External ear normal.  Nose: Nose normal.  Mouth/Throat: Oropharynx is clear and moist.  Eyes: Pupils are equal, round, and reactive to light. Conjunctivae and EOM are normal.  Neck: Normal range of motion. Neck supple. No thyromegaly present.  Cardiovascular: Normal rate, regular  rhythm and normal heart sounds.   No murmur heard. Pulmonary/Chest: Effort normal and breath sounds normal. No respiratory distress.  Decreased expiratory phase  Abdominal: Soft. Bowel sounds are normal. He exhibits no distension. There is no tenderness.  Lymphadenopathy:    He has no cervical adenopathy.  Neurological: He is alert and oriented to person, place, and time. He has normal reflexes.  Skin: Skin is warm and dry.  Psychiatric: He has a normal mood and affect. His behavior is normal. Judgment and thought content normal.      Assessment & Plan:   Dainel was seen today for copd.  Diagnoses and all orders for this visit:  Screening for prostate cancer -     PSA, total and free  COPD GOLD III  -     budesonide-formoterol (SYMBICORT) 160-4.5 MCG/ACT inhaler; Inhale 2 puffs into the lungs 2 (two) times daily.  Abnormal glucose -     Bayer DCA Hb A1c Waived  Other orders -     valsartan-hydrochlorothiazide (DIOVAN-HCT) 320-25 MG tablet; Take 1 tablet by mouth daily.       I have discontinued Mr. Lorge Ibuprofen-Diphenhydramine HCl. I have also changed his SYMBICORT to budesonide-formoterol. Additionally, I am having him maintain his aspirin, cholecalciferol, vitamin J-94, folic acid, acetaminophen, ibuprofen, albuterol, traMADol, NIFEdipine, levothyroxine, and valsartan-hydrochlorothiazide.  Allergies as of 02/04/2017      Reactions   No Known Allergies       Medication List       Accurate as of 02/04/17 11:42 AM. Always use your most recent med list.          acetaminophen 325 MG tablet Commonly known as:  TYLENOL Take 2 tablets (650 mg total) by mouth every 6 (six) hours as needed.   albuterol 108 (90 Base) MCG/ACT inhaler Commonly known as:  PROAIR HFA Inhale 2 puffs into the lungs every 6 (six) hours.   aspirin 81 MG tablet Take 81 mg by mouth daily.   budesonide-formoterol 160-4.5 MCG/ACT inhaler Commonly known as:  SYMBICORT Inhale 2 puffs  into the lungs 2 (two) times daily.   cholecalciferol 1000 units tablet Commonly known as:  VITAMIN D Take 1,000 Units by mouth daily.   folic acid 1 MG tablet Commonly known as:  FOLVITE Take 1 tablet (1 mg total) by mouth daily.   ibuprofen 400 MG tablet Commonly known as:  ADVIL,MOTRIN Take 1 tablet (400 mg total) by mouth every 4 (four) hours as needed.   levothyroxine 50 MCG tablet Commonly known as:  SYNTHROID, LEVOTHROID 1 TABLET EVERY MORNING BEFORE BREAKFAST-WAIT 1 HR AFTER TAKING BEFORE EATING OR DRINKING   NIFEdipine 60 MG 24 hr tablet Commonly known as:  PROCARDIA XL/ADALAT-CC Take 1 tablet (60 mg total) by mouth daily.   traMADol 50 MG tablet Commonly known as:  ULTRAM 1-2 every 4 hours as needed for cough or pain   valsartan-hydrochlorothiazide 320-25 MG tablet Commonly known as:  DIOVAN-HCT Take 1 tablet by mouth daily.   vitamin B-12 250 MCG tablet Commonly known as:  CYANOCOBALAMIN Take 250 mcg by mouth daily.  Discharge Care Instructions        Start     Ordered   02/04/17 0000  PSA, total and free     02/04/17 1057   02/04/17 0000  Bayer DCA Hb A1c Waived     02/04/17 1057   02/04/17 0000  budesonide-formoterol (SYMBICORT) 160-4.5 MCG/ACT inhaler  2 times daily     02/04/17 1057   02/04/17 0000  valsartan-hydrochlorothiazide (DIOVAN-HCT) 320-25 MG tablet  Daily     02/04/17 1057       Follow-up: Return in about 3 months (around 05/06/2017).  Claretta Fraise, M.D.

## 2017-02-05 LAB — PSA, TOTAL AND FREE
PROSTATE SPECIFIC AG, SERUM: 1 ng/mL (ref 0.0–4.0)
PSA FREE PCT: 29 %
PSA FREE: 0.29 ng/mL

## 2017-03-06 ENCOUNTER — Ambulatory Visit (INDEPENDENT_AMBULATORY_CARE_PROVIDER_SITE_OTHER): Payer: 59

## 2017-03-06 DIAGNOSIS — Z23 Encounter for immunization: Secondary | ICD-10-CM | POA: Diagnosis not present

## 2017-03-19 ENCOUNTER — Ambulatory Visit: Payer: 59 | Admitting: Family Medicine

## 2017-03-25 ENCOUNTER — Ambulatory Visit: Payer: 59 | Admitting: Internal Medicine

## 2017-03-25 ENCOUNTER — Encounter: Payer: Self-pay | Admitting: Internal Medicine

## 2017-03-25 VITALS — BP 152/76 | HR 92 | Ht 69.0 in | Wt 200.6 lb

## 2017-03-25 DIAGNOSIS — J449 Chronic obstructive pulmonary disease, unspecified: Secondary | ICD-10-CM

## 2017-03-25 MED ORDER — PREDNISONE 10 MG PO TABS: ORAL_TABLET | ORAL | 0 refills | Status: DC

## 2017-03-25 MED ORDER — GLYCOPYRROLATE-FORMOTEROL 9-4.8 MCG/ACT IN AERO: 2.0000 | INHALATION_SPRAY | Freq: Two times a day (BID) | RESPIRATORY_TRACT | 11 refills | Status: DC

## 2017-03-25 MED ORDER — GLYCOPYRROLATE-FORMOTEROL 9-4.8 MCG/ACT IN AERO: 2.0000 | INHALATION_SPRAY | Freq: Two times a day (BID) | RESPIRATORY_TRACT | 0 refills | Status: DC

## 2017-03-25 NOTE — Patient Instructions (Addendum)
Plan A = Automatic = Bevespi Take 2 puffs first thing in am and then another 2 puffs about 12 hours later.    Plan B = Backup Only use your albuterol as a rescue medication to be used if you can't catch your breath by resting or doing a relaxed purse lip breathing pattern.  - The less you use it, the better it will work when you need it. - Ok to use the inhaler up to 2 puffs  every 4 hours if you must but call for appointment if use goes up over your usual need - Don't leave home without it !!  (think of it like the spare tire for your car)    Prednisone 10 mg take  4 each am x 2 days,   2 each am x 2 days,  1 each am x 2 days and stop    Please schedule a follow up visit in 3 months but call sooner if needed  - if worse change to symb/spiriva

## 2017-03-25 NOTE — Assessment & Plan Note (Signed)
Spirometry 03/2011 FeV1 49%    Spirometry  02/28/2012  FEV1 45%   - Last day worked = October 13 2014  - PFT's  01/24/2015  FEV1 1.59  (43 % ) ratio 54   p 22 % improvement from saba with DLCO  88 % corrects to 99 % for alv volume s symbicort  - PFTs  12/27/15 1.30 (35%) ratio 35  In midst of a flare- - quit smoking 08/12/16  - PFT's  12/21/2016  FEV1 1.40 (39 % ) ratio 50  p 8 % improvement from saba p symbicort 160 x 2  prior to study with DLCO  71/69 % corrects to 85  % for alv volume  03/25/2017  After extensive coaching HFA effectiveness =    90% > try bevespi    Pt is Group B in terms of symptom/risk and laba/lama therefore appropriate rx at this point unless AB becomes an issue off ICS ( which should in theory be less of an issue if he's truly quit smoking)  Therefore rec trial of bevespi 2 bid with 6 days of prednisone to "reset the bar" and see if declines in terms of best days or has more exac on the laba/lama in which case will need to try the symb 160/spiriva smi instead of the DPI's which cause more hoarseness/ cough (which already may be happening on the high dose hfa ics)  Formulary restrictions will be an ongoing challenge for the forseable future and I would be happy to pick an alternative if the pt will first  provide me a list of them but pt  will need to return here for training for any new device that is required eg dpi vs hfa vs respimat.    In meantime we can always provide samples so the patient never runs out of any needed respiratory medications.    I had an extended discussion with the patient reviewing all relevant studies completed to date and  lasting 15 to 20 minutes of a 25 minute visit    Each maintenance medication was reviewed in detail including most importantly the difference between maintenance and prns and under what circumstances the prns are to be triggered using an action plan format that is not reflected in the computer generated alphabetically organized AVS.     Please see AVS for specific instructions unique to this visit that I personally wrote and verbalized to the the pt in detail and then reviewed with pt  by my nurse highlighting any  changes in therapy recommended at today's visit to their plan of care.

## 2017-03-25 NOTE — Progress Notes (Signed)
Subjective:    Patient ID: Frank Rosales, male    DOB: December 06, 1955    MRN: [id number]    Brief patient profile:  84 yowm "quit smoking"  08/12/16  with h/o exposure to Asbestosis at [place] ? Into the 80s  still able to walk  flat fine but steps x 3 flights sob with GOLD III copd 2013 prev eval by Dr Joya Gaskins  / gets annual f/u in [place]      History of Present Illness  02/16/2016  f/u ov/Kassadee Carawan re:  GOLD III copd /  symbicort 160 2bid / twice weekly saba  Chief Complaint  Patient presents with  . Follow-up    Pt states had CT Chest done 12/27/15- ordered by worker's comp for eval of previous asbestos exp. He states he had been doing well until July 2017 developed cough and congestion that lasted 2 months, but starting to improve.   sick July - September 2017 rx with abx/ steroids better since early Sept 2017 but CT was done in middle of this illness and was abnormal but was done for purpose of screening for asbestos  rec Plan A = Automatic = symbicort 160 Take 2 puffs first thing in am and then another 2 puffs about 12 hours later.  Work on inhaler technique:   Only use your albuterol(proair)  as a rescue medication  The key is to stop smoking completely before smoking completely stops you!      11/07/2016  f/u ov/Edmond Ginsberg re: acute cough - maintained on symb 160 2bid  Chief Complaint  Patient presents with  . Follow-up    Pt c/o chest and nasal congestion, cough, increased SOB for the past wk. His cough is prod with very thick, clear sputum. He states "feels like I'm drowning"-seen by PCP on 6/22 and given steroid inj and abx with no relief.   acute onset with sore thorat gone w/in first day but then nasal / congestion rx by Dr Quinn Axe on 11/02/16 with pred/levaquin Mucus is now clear  Using saba 2 4 x daily  rec Plan A = Automatic = Symbicort 160  Take 2 puffs first thing in am and then another 2 puffs about 12 hours later.  Plan B = Backup Only use your albuterol as a rescue  medication Prednisone 10 mg take  4 each am x 2 days,   2 each am x 2 days,  1 each am x 2 days and stop  Try prilosec otc 20mg   Take 30-60 min before first meal of the day and Pepcid ac (famotidine) 20 mg one @  bedtime until cough is completely gone for at least a week without the need for cough suppression and stop the krill oil for the same reason until no cough at all  For cough /congestion > mucinex dm up to 1200 mg every 12 hours as needed and tramadol 50 mg up to 2 every 4 hours  For nasal congestion;  advil cold and sinus congestion every 4 hours as needed     12/21/2016  f/u ov/Najwa Spillane re:  Copd gold III/AB  symb 160 2bid  Chief Complaint  Patient presents with  . Follow-up    PFT's done today. His breathing is doing well. He rarely uses albuterol. He c/o occ PND and cough with clear sputum.    doe now = MMRC2 = can't walk a nl pace on a flat grade s sob but does fine slow and flat   rec No change  rx    03/25/2017  f/u ov/Rogenia Werntz re:   COPD III/ maint on symb 160 2bid  Chief Complaint  Patient presents with  . Follow-up    Increased cough and PND for the past 4-5 days- mainly non prod.  He rarely uses his albuterol inhaler.    admits to occ smoking but can really feel it when he does and thinks he's finally done with them  Doe still = MMRC2  Only uses alb twice a month at most  No obvious day to day or daytime variability or assoc excess/ purulent sputum or mucus plugs or hemoptysis or cp or chest tightness, subjective wheeze or overt sinus or hb symptoms. No unusual exp hx or h/o childhood pna/ asthma or knowledge of premature birth.  Sleeping ok flat without nocturnal  or early am exacerbation  of respiratory  c/o's or need for noct saba. Also denies any obvious fluctuation of symptoms with weather or environmental changes or other aggravating or alleviating factors except as outlined above   Current Allergies, Complete Past Medical History, Past Surgical History, Family History,  and Social History were reviewed in [employer] record.  ROS  The following are not active complaints unless bolded Hoarseness, sore throat, dysphagia, dental problems, itching, sneezing,  nasal congestion or discharge of excess mucus or purulent secretions, ear ache,   fever, chills, sweats, unintended wt loss or wt gain, classically pleuritic or exertional cp,  orthopnea pnd or leg swelling, presyncope, palpitations, abdominal pain, anorexia, nausea, vomiting, diarrhea  or change in bowel habits or change in bladder habits, change in stools or change in urine, dysuria, hematuria,  rash, arthralgias, visual complaints, headache, numbness, weakness or ataxia or problems with walking or coordination,  change in mood/affect or memory.        Current Meds  Medication Sig  . acetaminophen (TYLENOL) 325 MG tablet Take 2 tablets (650 mg total) by mouth every 6 (six) hours as needed.  Marland Kitchen albuterol (PROAIR HFA) 108 (90 Base) MCG/ACT inhaler Inhale 2 puffs into the lungs every 6 (six) hours.  Marland Kitchen aspirin 81 MG tablet Take 81 mg by mouth daily.   . cholecalciferol (VITAMIN D) 1000 UNITS tablet Take 1,000 Units by mouth daily.   . famotidine (PEPCID) 20 MG tablet Take 20 mg at bedtime by mouth.  . folic acid (FOLVITE) 1 MG tablet Take 1 tablet (1 mg total) by mouth daily.  Marland Kitchen ibuprofen (ADVIL,MOTRIN) 400 MG tablet Take 1 tablet (400 mg total) by mouth every 4 (four) hours as needed.  . Ibuprofen-Diphenhydramine Cit (ADVIL PM PO) Take 1 tablet at bedtime as needed by mouth.  . levothyroxine (SYNTHROID, LEVOTHROID) 50 MCG tablet 1 TABLET EVERY MORNING BEFORE BREAKFAST-WAIT 1 HR AFTER TAKING BEFORE EATING OR DRINKING  . NIFEdipine (PROCARDIA XL/ADALAT-CC) 60 MG 24 hr tablet Take 1 tablet (60 mg total) by mouth daily.  . pseudoephedrine-guaifenesin (MUCINEX D) 60-600 MG 12 hr tablet Take 1 tablet every 12 (twelve) hours by mouth.  . traMADol (ULTRAM) 50 MG tablet 1-2 every 4 hours as needed  for cough or pain  . valsartan-hydrochlorothiazide (DIOVAN-HCT) 320-25 MG tablet Take 1 tablet by mouth daily.  . vitamin B-12 (CYANOCOBALAMIN) 250 MCG tablet Take 250 mcg by mouth daily.  . [  budesonide-formoterol (SYMBICORT) 160-4.5 MCG/ACT inhaler Inhale 2 puffs into the lungs 2 (two) times daily.                Objective:   Physical Exam  amb wm  nad gruff  Hoarse voice   01/24/2015        180 > 02/21/2015  180  > 06/16/2015  186  > 02/16/2016  205 > 09/13/2016   185 >  11/07/2016  187 >  12/21/2016  195 > 03/25/2017   200     11/10/14 174 lb (78.926 kg)  11/08/14 176 lb 6.4 oz (80.015 kg)  10/27/14 175 lb (79.379 kg)    Vital signs reviewed  -   - Note on arrival 02 sats  99% on RA     HEENT: nl dentition, turbinates bilaterally, and oropharynx. Nl external ear canals without cough reflex   NECK :  without JVD/Nodes/TM/ nl carotid upstrokes bilaterally   LUNGS: no acc muscle use,  slt barrel contour chest with insp and exp distant rhonchi bilaterally    CV:  RRR  no s3 or murmur or increase in P2, and no edema   ABD: obese  soft and nontender with nl inspiratory excursion in the supine position. No bruits or organomegaly appreciated, bowel sounds nl  MS:  Nl gait/ ext warm without deformities, calf tenderness, cyanosis or clubbing No obvious joint restrictions   SKIN: warm and dry without lesions    NEURO:  alert, approp, nl sensorium with  no motor or cerebellar deficits apparent.         CXR PA and Lateral:   12/21/2016 :    I personally reviewed images and agree with radiology impression as follows:   No acute cardiopulmonary findings. Chronic lung changes and chronic remote posttraumatic changes involving the left hemithorax.     Assessment & Plan:

## 2017-04-30 DIAGNOSIS — M5136 Other intervertebral disc degeneration, lumbar region: Secondary | ICD-10-CM | POA: Diagnosis not present

## 2017-04-30 DIAGNOSIS — M5416 Radiculopathy, lumbar region: Secondary | ICD-10-CM | POA: Diagnosis not present

## 2017-04-30 DIAGNOSIS — M4726 Other spondylosis with radiculopathy, lumbar region: Secondary | ICD-10-CM | POA: Diagnosis not present

## 2017-04-30 DIAGNOSIS — M48062 Spinal stenosis, lumbar region with neurogenic claudication: Secondary | ICD-10-CM | POA: Diagnosis not present

## 2017-05-09 ENCOUNTER — Ambulatory Visit: Payer: 59 | Admitting: Family Medicine

## 2017-05-16 LAB — PULMONARY FUNCTION TEST

## 2017-05-27 ENCOUNTER — Encounter: Payer: Self-pay | Admitting: Family Medicine

## 2017-05-27 ENCOUNTER — Ambulatory Visit: Payer: Medicare Other | Admitting: Family Medicine

## 2017-05-27 VITALS — BP 115/73 | HR 99 | Temp 97.8°F | Ht 69.0 in | Wt 199.0 lb

## 2017-05-27 DIAGNOSIS — I10 Essential (primary) hypertension: Secondary | ICD-10-CM | POA: Diagnosis not present

## 2017-05-27 DIAGNOSIS — J449 Chronic obstructive pulmonary disease, unspecified: Secondary | ICD-10-CM | POA: Diagnosis not present

## 2017-05-27 DIAGNOSIS — E7800 Pure hypercholesterolemia, unspecified: Secondary | ICD-10-CM | POA: Diagnosis not present

## 2017-05-27 MED ORDER — LEVOTHYROXINE SODIUM 50 MCG PO TABS: ORAL_TABLET | ORAL | 1 refills | Status: DC

## 2017-05-27 MED ORDER — VALSARTAN-HYDROCHLOROTHIAZIDE 320-25 MG PO TABS: 1.0000 | ORAL_TABLET | Freq: Every day | ORAL | 1 refills | Status: DC

## 2017-05-27 MED ORDER — NIFEDIPINE ER OSMOTIC RELEASE 60 MG PO TB24: 60.0000 mg | ORAL_TABLET | Freq: Every day | ORAL | 1 refills | Status: DC

## 2017-05-27 NOTE — Progress Notes (Signed)
Subjective:  Patient ID: Frank Rosales, male    DOB: Nov 19, 1955  Age: 62 y.o. MRN: [id number]  CC: Hypertension (pt here today for routine follow up of his chronic medical conditions, no other concerns voiced.)   HPI CAILEAN Rosales presents for patient presents for follow-up on  thyroid. The patient has a history of hypothyroidism for many years. It has been stable recently. Pt. denies any change in  voice, loss of hair, heat or cold intolerance. Energy level has been adequate to good. Patient denies constipation and diarrhea. No myxedema. Medication is as noted below. Verified that pt is taking it daily on an empty stomach. Well tolerated. Frank Rosales is a 62 y.o. male with hypertension. Patient has no history of headache chest pain or shortness of breath or recent cough. Patient also denies symptoms of TIA such as numbness weakness lateralizing. Patient checks  blood pressure at home and has not had any elevated readings recently. Patient denies side effects from his medication. States taking it regularly.  Current Outpatient Medications  Medication Sig Dispense Refill  . acetaminophen (TYLENOL) 325 MG tablet Take 2 tablets (650 mg total) by mouth every 6 (six) hours as needed.    Marland Kitchen albuterol (PROAIR HFA) 108 (90 Base) MCG/ACT inhaler Inhale 2 puffs into the lungs every 6 (six) hours. 1 Inhaler 2  . aspirin 81 MG tablet Take 81 mg by mouth daily.     . cholecalciferol (VITAMIN D) 1000 UNITS tablet Take 1,000 Units by mouth daily.     . famotidine (PEPCID) 20 MG tablet Take 20 mg at bedtime by mouth.    . folic acid (FOLVITE) 1 MG tablet Take 1 tablet (1 mg total) by mouth daily. 90 tablet 3  . ibuprofen (ADVIL,MOTRIN) 400 MG tablet Take 1 tablet (400 mg total) by mouth every 4 (four) hours as needed. 30 tablet 0  . Ibuprofen-Diphenhydramine Cit (ADVIL PM PO) Take 1 tablet at bedtime as needed by mouth.    . levothyroxine (SYNTHROID, LEVOTHROID) 50 MCG tablet 1 TABLET EVERY MORNING  BEFORE BREAKFAST-WAIT 1 HR AFTER TAKING BEFORE EATING OR DRINKING 90 tablet 1  . NIFEdipine (PROCARDIA XL/ADALAT-CC) 60 MG 24 hr tablet Take 1 tablet (60 mg total) by mouth daily. 90 tablet 1  . pseudoephedrine-guaifenesin (MUCINEX D) 60-600 MG 12 hr tablet Take 1 tablet every 12 (twelve) hours by mouth.    . SYMBICORT 160-4.5 MCG/ACT inhaler TAKE 2 PUFFS BY MOUTH TWICE A DAY  1  . valsartan-hydrochlorothiazide (DIOVAN-HCT) 320-25 MG tablet Take 1 tablet by mouth daily. 90 tablet 1  . vitamin B-12 (CYANOCOBALAMIN) 250 MCG tablet Take 250 mcg by mouth daily.     No current facility-administered medications for this visit.     Hypertension ROS: taking medications as instructed, no medication side effects noted, patient does not perform home BP monitoring, no TIA's, no chest pain on exertion, notes stable dyspnea on exertion, no change, no swelling of ankles, does note some dizziness when arising, no palpitations and erectile dysfunction is present.  New concerns: No relief of  E.D. With one sildenafil.   Objective:  BP 115/73   Pulse 99   Temp 97.8 F (36.6 C) (Oral)   Ht _0  (1.753 m)   Wt 199 lb (90.3 kg)   BMI 29.39 kg/m   Appearance alert, well appearing, and in no distress, oriented to person, place, and time, overweight, acyanotic, in no respiratory distress, well hydrated and chronically ill appearing. General exam BP  noted to be well controlled today in office, S1, S2 normal, no gallop, no murmur, chest clear, no JVD, no HSM, no edema.  Lab review: orders written for new lab studies as appropriate; see orders.   Assessment:   Hypertension stable.   Plan:  Lab results and schedule of future lab studies reviewed with patient. Reviewed diet, exercise and weight control. Cardiovascular risk and specific lipid/LDL goals reviewed. Reviewed medications and side effects in detail..   Depression screen [facility] 2/9 05/27/2017 02/04/2017 11/02/2016  Decreased Interest 0 0 0  Down,  Depressed, Hopeless 0 0 0  PHQ - 2 Score 0 0 0  Altered sleeping - - -  Tired, decreased energy - - -  Change in appetite - - -  Feeling bad or failure about yourself  - - -  Trouble concentrating - - -  Moving slowly or fidgety/restless - - -  Suicidal thoughts - - -  PHQ-9 Score - - -    History Frank Rosales has a past medical history of Anxiety, Arthritis, Asthma, Colon polyps, COPD (chronic obstructive pulmonary disease) ([place]), High blood pressure, High cholesterol, Hilar adenopathy, Hypothyroidism, and Multiple rib fractures (08/2016).   He has a past surgical history that includes Knee arthroscopy (Right, 05/14/2010); Wisdom tooth extraction; epidural injections; Fixation kyphoplasty lumbar spine; Back surgery; Video assisted thoracoscopy (Left, 08/22/2016); Pleural effusion drainage (Left, 08/22/2016); and Rib plating (Left, 08/22/2016).   His family history includes Emphysema in his father; Heart disease in his brother; Heart failure in his mother; Lung cancer in his father.He reports that he quit smoking about 9 months ago. His smoking use included cigarettes. He started smoking about 44 years ago. He has a 70.00 pack-year smoking history. he has never used smokeless tobacco. He reports that he drinks about 25.2 oz of alcohol per week. He reports that he does not use drugs.    ROS Review of Systems  Constitutional: Negative for chills, diaphoresis, fever and unexpected weight change.  HENT: Negative for congestion, hearing loss, rhinorrhea and sore throat.   Eyes: Negative for visual disturbance.  Respiratory: Positive for shortness of breath (at baseline). Negative for cough.   Cardiovascular: Negative for chest pain.  Gastrointestinal: Negative for abdominal pain, constipation and diarrhea.  Genitourinary: Negative for dysuria and flank pain.  Musculoskeletal: Negative for arthralgias and joint swelling.  Skin: Negative for rash.  Neurological: Negative for dizziness and headaches.    Psychiatric/Behavioral: Negative for dysphoric mood and sleep disturbance.    Objective:  BP 115/73   Pulse 99   Temp 97.8 F (36.6 C) (Oral)   Ht _0  (1.753 m)   Wt 199 lb (90.3 kg)   BMI 29.39 kg/m   BP Readings from Last 3 Encounters:  05/27/17 115/73  03/25/17 (!) 152/76  02/04/17 117/77    Wt Readings from Last 3 Encounters:  05/27/17 199 lb (90.3 kg)  03/25/17 200 lb 9.6 oz (91 kg)  02/04/17 200 lb (90.7 kg)     Physical Exam  Constitutional: He is oriented to person, place, and time. He appears well-developed and well-nourished. No distress.  HENT:  Head: Normocephalic and atraumatic.  Right Ear: External ear normal.  Left Ear: External ear normal.  Nose: Nose normal.  Mouth/Throat: Oropharynx is clear and moist.  Eyes: Conjunctivae and EOM are normal. Pupils are equal, round, and reactive to light.  Neck: Normal range of motion. Neck supple. No thyromegaly present.  Cardiovascular: Normal rate, regular rhythm and normal heart sounds.  No murmur  heard. Pulmonary/Chest: Effort normal and breath sounds normal. No respiratory distress. He has no wheezes. He has no rales.  Abdominal: Soft. Bowel sounds are normal. He exhibits no distension. There is no tenderness.  Lymphadenopathy:    He has no cervical adenopathy.  Neurological: He is alert and oriented to person, place, and time. He has normal reflexes.  Skin: Skin is warm and dry.  Psychiatric: He has a normal mood and affect. His behavior is normal. Judgment and thought content normal.      Assessment & Plan:   Dell was seen today for hypertension.  Diagnoses and all orders for this visit:  Essential hypertension -     CBC with Differential/Platelet -     CMP14+EGFR -     Lipid panel  COPD mixed type (HCC)  High cholesterol -     CBC with Differential/Platelet -     CMP14+EGFR -     Lipid panel  Other orders -     levothyroxine (SYNTHROID, LEVOTHROID) 50 MCG tablet; 1 TABLET EVERY  MORNING BEFORE BREAKFAST-WAIT 1 HR AFTER TAKING BEFORE EATING OR DRINKING -     NIFEdipine (PROCARDIA XL/ADALAT-CC) 60 MG 24 hr tablet; Take 1 tablet (60 mg total) by mouth daily. -     valsartan-hydrochlorothiazide (DIOVAN-HCT) 320-25 MG tablet; Take 1 tablet by mouth daily.       I have discontinued Eder A. Debose's traMADol, Glycopyrrolate-Formoterol, and predniSONE. I am also having him maintain his aspirin, cholecalciferol, vitamin M-01, folic acid, acetaminophen, ibuprofen, albuterol, famotidine, pseudoephedrine-guaifenesin, Ibuprofen-Diphenhydramine Cit (ADVIL PM PO), SYMBICORT, levothyroxine, NIFEdipine, and valsartan-hydrochlorothiazide.  Allergies as of 05/27/2017      Reactions   No Known Allergies       Medication List        Accurate as of 05/27/17 10:06 PM. Always use your most recent med list.          acetaminophen 325 MG tablet Commonly known as:  TYLENOL Take 2 tablets (650 mg total) by mouth every 6 (six) hours as needed.   ADVIL PM PO Take 1 tablet at bedtime as needed by mouth.   albuterol 108 (90 Base) MCG/ACT inhaler Commonly known as:  PROAIR HFA Inhale 2 puffs into the lungs every 6 (six) hours.   aspirin 81 MG tablet Take 81 mg by mouth daily.   cholecalciferol 1000 units tablet Commonly known as:  VITAMIN D Take 1,000 Units by mouth daily.   famotidine 20 MG tablet Commonly known as:  PEPCID Take 20 mg at bedtime by mouth.   folic acid 1 MG tablet Commonly known as:  FOLVITE Take 1 tablet (1 mg total) by mouth daily.   ibuprofen 400 MG tablet Commonly known as:  ADVIL,MOTRIN Take 1 tablet (400 mg total) by mouth every 4 (four) hours as needed.   levothyroxine 50 MCG tablet Commonly known as:  SYNTHROID, LEVOTHROID 1 TABLET EVERY MORNING BEFORE BREAKFAST-WAIT 1 HR AFTER TAKING BEFORE EATING OR DRINKING   NIFEdipine 60 MG 24 hr tablet Commonly known as:  PROCARDIA XL/ADALAT-CC Take 1 tablet (60 mg total) by mouth daily.     pseudoephedrine-guaifenesin 60-600 MG 12 hr tablet Commonly known as:  MUCINEX D Take 1 tablet every 12 (twelve) hours by mouth.   SYMBICORT 160-4.5 MCG/ACT inhaler Generic drug:  budesonide-formoterol TAKE 2 PUFFS BY MOUTH TWICE A DAY   valsartan-hydrochlorothiazide 320-25 MG tablet Commonly known as:  DIOVAN-HCT Take 1 tablet by mouth daily.   vitamin B-12 250 MCG tablet Commonly known as:  CYANOCOBALAMIN Take 250 mcg by mouth daily.        Follow-up: Return in about 3 months (around 08/25/2017).  Claretta Fraise, M.D.

## 2017-05-28 LAB — LIPID PANEL
Chol/HDL Ratio: 3.1 ratio (ref 0.0–5.0)
Cholesterol, Total: 186 mg/dL (ref 100–199)
HDL: 60 mg/dL (ref >39)
LDL CALC: 111 mg/dL — AB (ref 0–99)
Triglycerides: 77 mg/dL (ref 0–149)
VLDL CHOLESTEROL CAL: 15 mg/dL (ref 5–40)

## 2017-05-28 LAB — CBC WITH DIFFERENTIAL/PLATELET
BASOS ABS: 0.1 10*3/uL (ref 0.0–0.2)
Basos: 1 %
EOS (ABSOLUTE): 0.1 10*3/uL (ref 0.0–0.4)
Eos: 1 %
HEMOGLOBIN: 17.1 g/dL (ref 13.0–17.7)
Hematocrit: 49.7 % (ref 37.5–51.0)
IMMATURE GRANS (ABS): 0 10*3/uL (ref 0.0–0.1)
IMMATURE GRANULOCYTES: 0 %
LYMPHS: 15 %
Lymphocytes Absolute: 1.6 10*3/uL (ref 0.7–3.1)
MCH: 34.8 pg — AB (ref 26.6–33.0)
MCHC: 34.4 g/dL (ref 31.5–35.7)
MCV: 101 fL — ABNORMAL HIGH (ref 79–97)
Monocytes Absolute: 0.8 10*3/uL (ref 0.1–0.9)
Monocytes: 7 %
NEUTROS ABS: 8 10*3/uL — AB (ref 1.4–7.0)
NEUTROS PCT: 76 %
PLATELETS: 256 10*3/uL (ref 150–379)
RBC: 4.92 x10E6/uL (ref 4.14–5.80)
RDW: 15.2 % (ref 12.3–15.4)
WBC: 10.6 10*3/uL (ref 3.4–10.8)

## 2017-05-28 LAB — CMP14+EGFR
ALBUMIN: 4.9 g/dL (ref 3.5–5.5)
ALT: 41 IU/L (ref 0–44)
AST: 31 IU/L (ref 0–40)
Albumin/Globulin Ratio: 1.8 (ref 1.2–2.2)
Alkaline Phosphatase: 90 IU/L (ref 39–117)
BUN / CREAT RATIO: 7 — AB (ref 9–20)
BUN: 6 mg/dL (ref 6–24)
Bilirubin Total: 0.8 mg/dL (ref 0.0–1.2)
CALCIUM: 9.6 mg/dL (ref 8.7–10.2)
CO2: 24 mmol/L (ref 20–29)
CREATININE: 0.92 mg/dL (ref 0.76–1.27)
Chloride: 93 mmol/L — ABNORMAL LOW (ref 96–106)
GFR, EST AFRICAN AMERICAN: 105 mL/min/{1.73_m2} (ref >59)
GFR, EST NON AFRICAN AMERICAN: 91 mL/min/{1.73_m2} (ref >59)
GLUCOSE: 120 mg/dL — AB (ref 65–99)
Globulin, Total: 2.8 g/dL (ref 1.5–4.5)
Potassium: 4.5 mmol/L (ref 3.5–5.2)
Sodium: 134 mmol/L (ref 134–144)
Total Protein: 7.7 g/dL (ref 6.0–8.5)

## 2017-06-23 ENCOUNTER — Other Ambulatory Visit: Payer: Self-pay | Admitting: Family Medicine

## 2017-06-25 ENCOUNTER — Ambulatory Visit: Payer: Medicare Other | Admitting: Internal Medicine

## 2017-06-25 ENCOUNTER — Encounter: Payer: Self-pay | Admitting: Internal Medicine

## 2017-06-25 VITALS — BP 126/78 | HR 84 | Ht 69.0 in | Wt 198.0 lb

## 2017-06-25 DIAGNOSIS — J61 Pneumoconiosis due to asbestos and other mineral fibers: Secondary | ICD-10-CM | POA: Diagnosis not present

## 2017-06-25 DIAGNOSIS — J449 Chronic obstructive pulmonary disease, unspecified: Secondary | ICD-10-CM

## 2017-06-25 DIAGNOSIS — F17210 Nicotine dependence, cigarettes, uncomplicated: Secondary | ICD-10-CM | POA: Diagnosis not present

## 2017-06-25 NOTE — Assessment & Plan Note (Addendum)
Last exp known ? 1980s wore respirator thereafter when known exp likely  - CT chest 12/27/15 ? Pleural scarring developing in bases > f/u planned by Asbestos lawyer/ Dr Pearlie Oyster last study  05/2017 no change  Per pt   Advised smoking with asbestosis is throwing gasoline on a smoldering flame

## 2017-06-25 NOTE — Patient Instructions (Signed)
The key is to stop smoking completely before smoking completely stops you!  No change medications  Please schedule a follow up visit in 6  months but call sooner if needed     

## 2017-06-25 NOTE — Progress Notes (Signed)
Subjective:    Patient ID: Frank Rosales, male    DOB: December 06, 1955    MRN: [id number]    Brief patient profile:  84 yowm "quit smoking"  08/12/16  with h/o exposure to Asbestosis at [place] ? Into the 80s  still able to walk  flat fine but steps x 3 flights sob with GOLD III copd 2013 prev eval by Dr Joya Gaskins  / gets annual f/u in [place]      History of Present Illness  02/16/2016  f/u ov/Frank Rosales re:  GOLD III copd /  symbicort 160 2bid / twice weekly saba  Chief Complaint  Patient presents with  . Follow-up    Pt states had CT Chest done 12/27/15- ordered by worker's comp for eval of previous asbestos exp. He states he had been doing well until July 2017 developed cough and congestion that lasted 2 months, but starting to improve.   sick July - September 2017 rx with abx/ steroids better since early Sept 2017 but CT was done in middle of this illness and was abnormal but was done for purpose of screening for asbestos  rec Plan A = Automatic = symbicort 160 Take 2 puffs first thing in am and then another 2 puffs about 12 hours later.  Work on inhaler technique:   Only use your albuterol(proair)  as a rescue medication  The key is to stop smoking completely before smoking completely stops you!      11/07/2016  f/u ov/Frank Rosales re: acute cough - maintained on symb 160 2bid  Chief Complaint  Patient presents with  . Follow-up    Pt c/o chest and nasal congestion, cough, increased SOB for the past wk. His cough is prod with very thick, clear sputum. He states "feels like I'm drowning"-seen by PCP on 6/22 and given steroid inj and abx with no relief.   acute onset with sore thorat gone w/in first day but then nasal / congestion rx by Dr Quinn Axe on 11/02/16 with pred/levaquin Mucus is now clear  Using saba 2 4 x daily  rec Plan A = Automatic = Symbicort 160  Take 2 puffs first thing in am and then another 2 puffs about 12 hours later.  Plan B = Backup Only use your albuterol as a rescue  medication Prednisone 10 mg take  4 each am x 2 days,   2 each am x 2 days,  1 each am x 2 days and stop  Try prilosec otc 20mg   Take 30-60 min before first meal of the day and Pepcid ac (famotidine) 20 mg one @  bedtime until cough is completely gone for at least a week without the need for cough suppression and stop the krill oil for the same reason until no cough at all  For cough /congestion > mucinex dm up to 1200 mg every 12 hours as needed and tramadol 50 mg up to 2 every 4 hours  For nasal congestion;  advil cold and sinus congestion every 4 hours as needed     12/21/2016  f/u ov/Frank Rosales re:  Copd gold III/AB  symb 160 2bid  Chief Complaint  Patient presents with  . Follow-up    PFT's done today. His breathing is doing well. He rarely uses albuterol. He c/o occ PND and cough with clear sputum.    doe now = MMRC2 = can't walk a nl pace on a flat grade s sob but does fine slow and flat   rec No change  rx    03/25/2017  f/u ov/Frank Rosales re:   COPD III/ maint on symb 160 2bid  Chief Complaint  Patient presents with  . Follow-up    Increased cough and PND for the past 4-5 days- mainly non prod.  He rarely uses his albuterol inhaler.    admits to occ smoking but can really feel it when he does and thinks he's finally done with them  Doe still = MMRC2  Only uses alb twice a month at most rec Plan A = Automatic = Bevespi Take 2 puffs first thing in am and then another 2 puffs about 12 hours later.  Plan B = Backup Only use your albuterol as a rescue medication Prednisone 10 mg take  4 each am x 2 days,   2 each am x 2 days,  1 each am x 2 days and stop     06/25/2017  f/u ov/Frank Rosales re:  Copd III still smoking/ preferred symb 160 over bevespi Chief Complaint  Patient presents with  . Follow-up    Breathing is overall doing well. He uses his albuterol 2 x per month on average.   Dyspnea:  Limited by back more than breathing but does ok leaning on cart /slow = MMRC3 = can't walk 100 yards  even at a slow pace at a flat grade s stopping due to sob and very rarely needing saba   Cough: min mostly am/ mucoid Sleep: no resp issues on 2 pillows   No obvious day to day or daytime variability or assoc excess/ purulent sputum or mucus plugs or hemoptysis or cp or chest tightness, subjective wheeze or overt sinus or hb symptoms. No unusual exposure hx or h/o childhood pna/ asthma or knowledge of premature birth.  Sleeping ok 2 pillows without nocturnal   exacerbation  of respiratory  c/o's or need for noct saba. Also denies any obvious fluctuation of symptoms with weather or environmental changes or other aggravating or alleviating factors except as outlined above   Current Allergies, Complete Past Medical History, Past Surgical History, Family History, and Social History were reviewed in [employer] record.  ROS  The following are not active complaints unless bolded Hoarseness, sore throat, dysphagia, dental problems, itching, sneezing,  nasal congestion or discharge of excess mucus or purulent secretions, ear ache,   fever, chills, sweats, unintended wt loss or wt gain, classically pleuritic or exertional cp,  orthopnea pnd or leg swelling, presyncope, palpitations, abdominal pain, anorexia, nausea, vomiting, diarrhea  or change in bowel habits or change in bladder habits, change in stools or change in urine, dysuria, hematuria,  rash, arthralgias, visual complaints, headache, numbness, weakness or ataxia or problems with walking or coordination,  change in mood/affect or memory.        Current Meds  Medication Sig  . acetaminophen (TYLENOL) 325 MG tablet Take 2 tablets (650 mg total) by mouth every 6 (six) hours as needed.  Marland Kitchen albuterol (PROAIR HFA) 108 (90 Base) MCG/ACT inhaler Inhale 2 puffs into the lungs every 6 (six) hours.  Marland Kitchen aspirin 81 MG tablet Take 81 mg by mouth daily.   . cholecalciferol (VITAMIN D) 1000 UNITS tablet Take 1,000 Units by mouth daily.   .  famotidine (PEPCID) 20 MG tablet Take 20 mg at bedtime by mouth.  . folic acid (FOLVITE) 1 MG tablet Take 1 tablet (1 mg total) by mouth daily.  Marland Kitchen ibuprofen (ADVIL,MOTRIN) 400 MG tablet Take 1 tablet (400 mg total) by mouth every  4 (four) hours as needed.  . Ibuprofen-Diphenhydramine Cit (ADVIL PM PO) Take 1 tablet at bedtime as needed by mouth.  . levothyroxine (SYNTHROID, LEVOTHROID) 50 MCG tablet 1 TABLET EVERY MORNING BEFORE BREAKFAST-WAIT 1 HR AFTER TAKING BEFORE EATING OR DRINKING  . NIFEdipine (PROCARDIA XL/ADALAT-CC) 60 MG 24 hr tablet TAKE 1 TABLET BY MOUTH EVERY DAY  . pseudoephedrine-guaifenesin (MUCINEX D) 60-600 MG 12 hr tablet Take 1 tablet every 12 (twelve) hours by mouth.  . SYMBICORT 160-4.5 MCG/ACT inhaler TAKE 2 PUFFS BY MOUTH TWICE A DAY  . valsartan-hydrochlorothiazide (DIOVAN-HCT) 320-25 MG tablet Take 1 tablet by mouth daily.  . vitamin B-12 (CYANOCOBALAMIN) 250 MCG tablet Take 250 mcg by mouth daily.                    Objective:   Physical Exam   pleasant mildly hoarse wm nad   01/24/2015        180 > 02/21/2015  180  > 06/16/2015  186  > 02/16/2016  205 > 09/13/2016   185 >  11/07/2016  187 >  12/21/2016  195 > 03/25/2017   200 > 06/25/2017   198     11/10/14 174 lb (78.926 kg)  11/08/14 176 lb 6.4 oz (80.015 kg)  10/27/14 175 lb (79.379 kg)      Vital signs reviewed - Note on arrival 02 sats  96% on RA     HEENT: nl dentition, turbinates bilaterally, and oropharynx. Nl external ear canals without cough reflex   NECK :  without JVD/Nodes/TM/ nl carotid upstrokes bilaterally   LUNGS: no acc muscle use,   Mildly barrel contour chest wall with bilateral  disant insp/ exp rhonchi without cough on insp or exp maneuver and hyper resonant to  percussion  Bilaterally   CV:  RRR  no s3 or murmur or increase in P2, and no edema   ABD:  soft and nontender with nl inspiratory excursion in the supine position. No bruits or organomegaly appreciated, bowel sounds  nl  MS:  Nl gait/ ext warm without deformities, calf tenderness, cyanosis or clubbing No obvious joint restrictions   SKIN: warm and dry without lesions    NEURO:  alert, approp, nl sensorium with  no motor or cerebellar deficits apparent.            Assessment & Plan:

## 2017-06-25 NOTE — Assessment & Plan Note (Signed)
Spirometry 03/2011 FeV1 49%    Spirometry  02/28/2012  FEV1 45%   - Last day worked = October 13 2014  - PFT's  01/24/2015  FEV1 1.59  (43 % ) ratio 54   p 22 % improvement from saba with DLCO  88 % corrects to 99 % for alv volume s symbicort  - PFTs  12/27/15 1.30 (35%) ratio 35  In midst of a flare- - quit smoking 08/12/16  - PFT's  12/21/2016  FEV1 1.40 (39 % ) ratio 50  p 8 % improvement from saba p symbicort 160 x 2  prior to study with DLCO  71/69 % corrects to 85  % for alv volume  03/25/2017  After extensive coaching HFA effectiveness =    90% > try bevespi > preferred symb 160   Adequate control on present rx, reviewed in detail with pt > no change in rx needed > I suspect if he weren't so limited by his back he would prefer the lama/laba but also may need the ICS at this point to offset inflammatory changes from smoking> ok just to continue symbicort 160 2bid and work on stopping smoking (see separate a/p)    I had an extended discussion with the patient reviewing all relevant studies completed to date and  lasting 15 to 20 minutes of a 25 minute visit    Each maintenance medication was reviewed in detail including most importantly the difference between maintenance and prns and under what circumstances the prns are to be triggered using an action plan format that is not reflected in the computer generated alphabetically organized AVS.    Please see AVS for specific instructions unique to this visit that I personally wrote and verbalized to the the pt in detail and then reviewed with pt  by my nurse highlighting any  changes in therapy recommended at today's visit to their plan of care.

## 2017-06-25 NOTE — Assessment & Plan Note (Signed)

## 2017-09-25 DIAGNOSIS — Z0289 Encounter for other administrative examinations: Secondary | ICD-10-CM

## 2017-09-30 ENCOUNTER — Ambulatory Visit: Payer: Medicare Other | Admitting: Physician Assistant

## 2017-09-30 ENCOUNTER — Encounter: Payer: Self-pay | Admitting: Physician Assistant

## 2017-09-30 VITALS — BP 113/71 | HR 108 | Temp 100.1°F | Ht 69.0 in | Wt 197.8 lb

## 2017-09-30 DIAGNOSIS — J40 Bronchitis, not specified as acute or chronic: Secondary | ICD-10-CM | POA: Diagnosis not present

## 2017-09-30 DIAGNOSIS — J180 Bronchopneumonia, unspecified organism: Secondary | ICD-10-CM | POA: Diagnosis not present

## 2017-09-30 MED ORDER — METHYLPREDNISOLONE ACETATE 80 MG/ML IJ SUSP
80.0000 mg | Freq: Once | INTRAMUSCULAR | Status: AC
  Administered 2017-09-30: 80 mg via INTRAMUSCULAR

## 2017-09-30 MED ORDER — CEFDINIR 300 MG PO CAPS: 300.0000 mg | ORAL_CAPSULE | Freq: Two times a day (BID) | ORAL | 0 refills | Status: DC

## 2017-09-30 MED ORDER — HYDROCODONE-HOMATROPINE 5-1.5 MG/5ML PO SYRP: 5.0000 mL | ORAL_SOLUTION | Freq: Four times a day (QID) | ORAL | 0 refills | Status: DC | PRN

## 2017-09-30 NOTE — Progress Notes (Signed)
BP 113/71   Pulse (!) 108   Temp 100.1 F (37.8 C) (Oral)   Ht 5\' 9"  (1.753 m)   Wt 197 lb 12.8 oz (89.7 kg)   SpO2 93%   BMI 29.21 kg/m    Subjective:    Patient ID: Frank Rosales, male    DOB: 22-Apr-1956, 62 y.o.   MRN: [id number]  HPI: Frank Rosales is a 62 y.o. male presenting on 09/30/2017 for Cough; Sinusitis; and scratchy throat  Patient with several days of progressing upper respiratory and bronchial symptoms. Initially there was more upper respiratory congestion. This progressed to having significant cough that is productive throughout the day and severe at night. There is occasional wheezing after coughing. Sometimes there is slight dyspnea on exertion. It is productive mucus that is yellow in color. Denies any blood.   Past Medical History:  Diagnosis Date  . Anxiety   . Arthritis    "neck; lower back; right hip; right knee" (10/18/2014)  . Asthma   . Colon polyps   . COPD (chronic obstructive pulmonary disease) ([place])   . High blood pressure   . High cholesterol   . Hilar adenopathy   . Hypothyroidism   . Multiple rib fractures 08/2016   left side   Relevant past medical, surgical, family and social history reviewed and updated as indicated. Interim medical history since our last visit reviewed. Allergies and medications reviewed and updated. DATA REVIEWED: CHART IN EPIC  Family History reviewed for pertinent findings.  Review of Systems  Constitutional: Positive for fatigue and fever. Negative for appetite change.  HENT: Positive for sinus pressure and sore throat.   Eyes: Negative.  Negative for pain and visual disturbance.  Respiratory: Positive for cough, shortness of breath and wheezing. Negative for chest tightness.   Cardiovascular: Negative.  Negative for chest pain, palpitations and leg swelling.  Gastrointestinal: Negative.  Negative for abdominal pain, diarrhea, nausea and vomiting.  Endocrine: Negative.   Genitourinary: Negative.     Musculoskeletal: Positive for myalgias. Negative for back pain.  Skin: Negative.  Negative for color change and rash.  Neurological: Positive for headaches. Negative for weakness and numbness.  Psychiatric/Behavioral: Negative.     Allergies as of 09/30/2017      Reactions   No Known Allergies       Medication List        Accurate as of 09/30/17 12:26 PM. Always use your most recent med list.          acetaminophen 325 MG tablet Commonly known as:  TYLENOL Take 2 tablets (650 mg total) by mouth every 6 (six) hours as needed.   ADVIL PM PO Take 1 tablet at bedtime as needed by mouth.   albuterol 108 (90 Base) MCG/ACT inhaler Commonly known as:  PROAIR HFA Inhale 2 puffs into the lungs every 6 (six) hours.   aspirin 81 MG tablet Take 81 mg by mouth daily.   cefdinir 300 MG capsule Commonly known as:  OMNICEF Take 1 capsule (300 mg total) by mouth 2 (two) times daily. 1 po BID   cholecalciferol 1000 units tablet Commonly known as:  VITAMIN D Take 1,000 Units by mouth daily.   famotidine 20 MG tablet Commonly known as:  PEPCID Take 20 mg at bedtime by mouth.   folic acid 1 MG tablet Commonly known as:  FOLVITE Take 1 tablet (1 mg total) by mouth daily.   HYDROcodone-homatropine 5-1.5 MG/5ML syrup Commonly known as:  HYCODAN Take 5-10  mLs by mouth every 6 (six) hours as needed for cough.   ibuprofen 400 MG tablet Commonly known as:  ADVIL,MOTRIN Take 1 tablet (400 mg total) by mouth every 4 (four) hours as needed.   Krill Oil 1000 MG Caps Take by mouth.   levothyroxine 50 MCG tablet Commonly known as:  SYNTHROID, LEVOTHROID 1 TABLET EVERY MORNING BEFORE BREAKFAST-WAIT 1 HR AFTER TAKING BEFORE EATING OR DRINKING   NIFEdipine 60 MG 24 hr tablet Commonly known as:  PROCARDIA XL/ADALAT-CC TAKE 1 TABLET BY MOUTH EVERY DAY   pseudoephedrine-guaifenesin 60-600 MG 12 hr tablet Commonly known as:  MUCINEX D Take 1 tablet every 12 (twelve) hours by mouth.    SYMBICORT 160-4.5 MCG/ACT inhaler Generic drug:  budesonide-formoterol TAKE 2 PUFFS BY MOUTH TWICE A DAY   valsartan-hydrochlorothiazide 320-25 MG tablet Commonly known as:  DIOVAN-HCT Take 1 tablet by mouth daily.   vitamin B-12 250 MCG tablet Commonly known as:  CYANOCOBALAMIN Take 250 mcg by mouth daily.          Objective:    BP 113/71   Pulse (!) 108   Temp 100.1 F (37.8 C) (Oral)   Ht 5\' 9"  (1.753 m)   Wt 197 lb 12.8 oz (89.7 kg)   SpO2 93%   BMI 29.21 kg/m   Allergies  Allergen Reactions  . No Known Allergies     Wt Readings from Last 3 Encounters:  09/30/17 197 lb 12.8 oz (89.7 kg)  06/25/17 198 lb (89.8 kg)  05/27/17 199 lb (90.3 kg)    Physical Exam  Constitutional: He appears well-developed and well-nourished.  HENT:  Head: Normocephalic and atraumatic.  Right Ear: Hearing and tympanic membrane normal.  Left Ear: Hearing and tympanic membrane normal.  Nose: Mucosal edema and sinus tenderness present. No nasal deformity. Right sinus exhibits frontal sinus tenderness. Left sinus exhibits frontal sinus tenderness.  Mouth/Throat: Posterior oropharyngeal erythema present.  Eyes: Pupils are equal, round, and reactive to light. Conjunctivae and EOM are normal. Right eye exhibits no discharge. Left eye exhibits no discharge.  Neck: Normal range of motion. Neck supple.  Cardiovascular: Normal rate, regular rhythm and normal heart sounds.  Pulmonary/Chest: Effort normal. No respiratory distress. He has decreased breath sounds in the right middle field and the right lower field. He has wheezes. He has no rhonchi. He has no rales.      Abdominal: Soft. Bowel sounds are normal.  Musculoskeletal: Normal range of motion.  Skin: Skin is warm and dry.        Assessment & Plan:   1. Bronchitis - methylPREDNISolone acetate (DEPO-MEDROL) injection 80 mg  2. Bronchopneumonia - methylPREDNISolone acetate (DEPO-MEDROL) injection 80 mg - cefdinir (OMNICEF) 300  MG capsule; Take 1 capsule (300 mg total) by mouth 2 (two) times daily. 1 po BID  Dispense: 20 capsule; Refill: 0 - HYDROcodone-homatropine (HYCODAN) 5-1.5 MG/5ML syrup; Take 5-10 mLs by mouth every 6 (six) hours as needed for cough.  Dispense: 240 mL; Refill: 0   Continue all other maintenance medications as listed above.  Follow up plan: Follow-up as needed or worsening of symptoms. Call office for any issues.   Educational handout given for [place] PA-C [facility] [address]  [place], [place] [address] [phone]   09/30/2017, 12:26 PM

## 2017-09-30 NOTE — Patient Instructions (Signed)
In a few days you may receive a survey in the mail or online from [employer] regarding your visit with us today. Please take a moment to fill this out. Your feedback is very important to our whole office. It can help us better understand your needs as well as improve your experience and satisfaction. Thank you for taking your time to complete it. We care about you.  Sante Biedermann, PA-C  

## 2017-10-08 ENCOUNTER — Ambulatory Visit: Payer: Medicare Other | Admitting: Family Medicine

## 2017-11-06 ENCOUNTER — Other Ambulatory Visit: Payer: Self-pay | Admitting: Family Medicine

## 2017-11-06 DIAGNOSIS — J449 Chronic obstructive pulmonary disease, unspecified: Secondary | ICD-10-CM

## 2017-11-23 ENCOUNTER — Encounter: Payer: Self-pay | Admitting: Family Medicine

## 2017-11-23 ENCOUNTER — Ambulatory Visit: Payer: Medicare Other | Admitting: Family Medicine

## 2017-11-23 VITALS — BP 124/77 | HR 104 | Temp 98.1°F | Ht 69.0 in | Wt 195.6 lb

## 2017-11-23 DIAGNOSIS — J441 Chronic obstructive pulmonary disease with (acute) exacerbation: Secondary | ICD-10-CM

## 2017-11-23 DIAGNOSIS — R509 Fever, unspecified: Secondary | ICD-10-CM | POA: Diagnosis not present

## 2017-11-23 DIAGNOSIS — R05 Cough: Secondary | ICD-10-CM | POA: Diagnosis not present

## 2017-11-23 DIAGNOSIS — J449 Chronic obstructive pulmonary disease, unspecified: Secondary | ICD-10-CM

## 2017-11-23 DIAGNOSIS — J029 Acute pharyngitis, unspecified: Secondary | ICD-10-CM | POA: Diagnosis not present

## 2017-11-23 MED ORDER — LEVOFLOXACIN 500 MG PO TABS: 500.0000 mg | ORAL_TABLET | Freq: Every day | ORAL | 0 refills | Status: DC

## 2017-11-23 MED ORDER — PREDNISONE 10 MG PO TABS: ORAL_TABLET | ORAL | 0 refills | Status: DC

## 2017-11-23 MED ORDER — ALBUTEROL SULFATE HFA 108 (90 BASE) MCG/ACT IN AERS: 2.0000 | INHALATION_SPRAY | Freq: Four times a day (QID) | RESPIRATORY_TRACT | 2 refills | Status: DC

## 2017-11-23 MED ORDER — BETAMETHASONE SOD PHOS & ACET 6 (3-3) MG/ML IJ SUSP
6.0000 mg | Freq: Once | INTRAMUSCULAR | Status: AC
  Administered 2017-11-23: 6 mg via INTRAMUSCULAR

## 2017-11-23 MED ORDER — CEFTRIAXONE SODIUM 1 G IJ SOLR
1.0000 g | Freq: Once | INTRAMUSCULAR | Status: AC
  Administered 2017-11-23: 1 g via INTRAMUSCULAR

## 2017-11-23 MED ORDER — ALBUTEROL SULFATE (2.5 MG/3ML) 0.083% IN NEBU
2.5000 mg | INHALATION_SOLUTION | Freq: Once | RESPIRATORY_TRACT | Status: AC
  Administered 2017-11-23: 2.5 mg via RESPIRATORY_TRACT

## 2017-11-23 NOTE — Progress Notes (Signed)
Chief Complaint  Patient presents with  . Shortness of Breath    started Wednesday / Thursday has gotten worse, thought was heat  . Sore Throat    hard to swallow  . Cough    productive, green phlegm  . Fever    101.4 last night, tylenol & ibuprofen,     HPI  Patient presents today for onset 3 to 4 days ago of cough productive of large amounts of green sputum increasing dyspnea as well as sore throat and fever.  Last night fever was measured at 101.4.  This was in spite of resting through the day yesterday.  The previous 2 days he been out in the heat more and got exhausted.  This led to him trying to stay in and rest and stay cool through the day.  Most recent COPD exacerbation was 6 to 8 weeks ago.  PMH: Smoking status noted ROS: Review of Systems  Constitutional: Positive for activity change, diaphoresis, fatigue and fever.  HENT: Positive for congestion and sore throat.   Respiratory: Positive for cough, shortness of breath and wheezing.   Musculoskeletal: Positive for arthralgias, back pain and myalgias.  Psychiatric/Behavioral: The patient is nervous/anxious.      Objective: BP 124/77   Pulse (!) 104   Temp 98.1 F (36.7 C) (Oral)   Ht 5\' 9"  (1.753 m)   Wt 195 lb 9.6 oz (88.7 kg)   SpO2 93%   BMI 28.89 kg/m  Gen: NAD, alert, cooperative with exam HEENT: NCAT, EOMI, PERRL CV: RRR, good S1/S2, no murmur Resp: Coarse rhonchi throughout with wheezing noted at the left lower field in particular. Abd: Soft, nontender Ext: No edema, warm Neuro: Alert and oriented, No gross deficits  Assessment and plan:  1. COPD exacerbation ([name])   2. COPD GOLD III      Meds ordered this encounter  Medications  . albuterol (PROVENTIL) (2.5 MG/3ML) 0.083% nebulizer solution 2.5 mg  . cefTRIAXone (ROCEPHIN) injection 1 g  . betamethasone acetate-betamethasone sodium phosphate (CELESTONE) injection 6 mg  . levofloxacin (LEVAQUIN) 500 MG tablet    Sig: Take 1 tablet (500 mg total)  by mouth daily. For 10 days    Dispense:  10 tablet    Refill:  0  . predniSONE (DELTASONE) 10 MG tablet    Sig: Take 5 daily for 3 days followed by 4,3,2 and 1 for 3 days each.    Dispense:  45 tablet    Refill:  0  . albuterol (PROAIR HFA) 108 (90 Base) MCG/ACT inhaler    Sig: Inhale 2 puffs into the lungs every 6 (six) hours.    Dispense:  1 Inhaler    Refill:  2    No orders of the defined types were placed in this encounter.   Follow up as needed.  Claretta Fraise, MD

## 2017-11-25 ENCOUNTER — Ambulatory Visit (INDEPENDENT_AMBULATORY_CARE_PROVIDER_SITE_OTHER): Payer: Medicare Other | Admitting: Family Medicine

## 2017-11-25 ENCOUNTER — Encounter: Payer: Self-pay | Admitting: Family Medicine

## 2017-11-25 ENCOUNTER — Ambulatory Visit (INDEPENDENT_AMBULATORY_CARE_PROVIDER_SITE_OTHER): Payer: Medicare Other

## 2017-11-25 VITALS — BP 111/75 | HR 103 | Temp 97.1°F | Ht 69.0 in | Wt 194.5 lb

## 2017-11-25 DIAGNOSIS — J441 Chronic obstructive pulmonary disease with (acute) exacerbation: Secondary | ICD-10-CM

## 2017-11-25 DIAGNOSIS — R05 Cough: Secondary | ICD-10-CM | POA: Diagnosis not present

## 2017-11-25 NOTE — Progress Notes (Signed)
Subjective:  Patient ID: Frank Rosales, male    DOB: July 29, 1955  Age: 62 y.o. MRN: [id number]  CC: COPD   HPI Frank Rosales presents for a patient is in for follow-up from 2 days ago when he was very short of breath.  He has some shortness of breath continuing but it has improved.  He is using inhalers and antibiotics at home.  He is somewhat less short of breath than he has been.  And wall he is no longer having chills.  The copious amounts of sputum have diminished all but are still green.  Depression screen [facility] 2/9 11/23/2017 09/30/2017 05/27/2017  Decreased Interest 0 0 0  Down, Depressed, Hopeless 0 0 0  PHQ - 2 Score 0 0 0  Altered sleeping - - -  Tired, decreased energy - - -  Change in appetite - - -  Feeling bad or failure about yourself  - - -  Trouble concentrating - - -  Moving slowly or fidgety/restless - - -  Suicidal thoughts - - -  PHQ-9 Score - - -    History Frank Rosales has a past medical history of Anxiety, Arthritis, Asthma, Colon polyps, COPD (chronic obstructive pulmonary disease) ([place]), High blood pressure, High cholesterol, Hilar adenopathy, Hypothyroidism, and Multiple rib fractures (08/2016).   He has a past surgical history that includes Knee arthroscopy (Right, 05/14/2010); Wisdom tooth extraction; epidural injections; Fixation kyphoplasty lumbar spine; Back surgery; Video assisted thoracoscopy (Left, 08/22/2016); Pleural effusion drainage (Left, 08/22/2016); and Rib plating (Left, 08/22/2016).   His family history includes Emphysema in his father; Heart disease in his brother; Heart failure in his mother; Lung cancer in his father.He reports that he has been smoking cigarettes.  He started smoking about 44 years ago. He has a 70.00 pack-year smoking history. He has never used smokeless tobacco. He reports that he drinks about 25.2 oz of alcohol per week. He reports that he does not use drugs.    ROS Review of Systems  Constitutional: Negative.   HENT:  Negative.   Eyes: Negative for visual disturbance.  Respiratory: Positive for cough and shortness of breath.   Cardiovascular: Negative for chest pain and leg swelling.  Gastrointestinal: Negative for abdominal pain, diarrhea, nausea and vomiting.  Genitourinary: Negative for difficulty urinating.  Musculoskeletal: Negative for arthralgias and myalgias.  Skin: Negative for rash.  Neurological: Negative for headaches.  Psychiatric/Behavioral: Negative for sleep disturbance.    Objective:  BP 111/75   Pulse (!) 103   Temp (!) 97.1 F (36.2 C) (Oral)   Ht 5\' 9"  (1.753 m)   Wt 194 lb 8 oz (88.2 kg)   SpO2 95%   BMI 28.72 kg/m   BP Readings from Last 3 Encounters:  11/25/17 111/75  11/23/17 124/77  09/30/17 113/71    Wt Readings from Last 3 Encounters:  11/25/17 194 lb 8 oz (88.2 kg)  11/23/17 195 lb 9.6 oz (88.7 kg)  09/30/17 197 lb 12.8 oz (89.7 kg)     Physical Exam  Constitutional: He is oriented to person, place, and time. He appears well-developed and well-nourished.  HENT:  Head: Normocephalic and atraumatic.  Right Ear: Tympanic membrane and external ear normal. No decreased hearing is noted.  Left Ear: Tympanic membrane and external ear normal. No decreased hearing is noted.  Mouth/Throat: No oropharyngeal exudate or posterior oropharyngeal erythema.  Eyes: Pupils are equal, round, and reactive to light.  Neck: Normal range of motion. Neck supple.  Cardiovascular: Normal rate  and regular rhythm.  No murmur heard. Pulmonary/Chest: No respiratory distress. He has wheezes.  Abdominal: Soft. Bowel sounds are normal. He exhibits no mass. There is no tenderness.  Neurological: He is alert and oriented to person, place, and time.  Vitals reviewed.  CXR: air bronchograms at [facility]   Assessment & Plan:   Frank Rosales was seen today for copd.  Diagnoses and all orders for this visit:  COPD exacerbation ([place]) -     DG Chest 2 View; Future       I have  discontinued Frank Rosales's HYDROcodone-homatropine. I am also having him maintain his aspirin, cholecalciferol, vitamin H-06, folic acid, acetaminophen, ibuprofen, famotidine, pseudoephedrine-guaifenesin, Ibuprofen-Diphenhydramine Cit (ADVIL PM PO), levothyroxine, valsartan-hydrochlorothiazide, NIFEdipine, Krill Oil, SYMBICORT, levofloxacin, predniSONE, and albuterol.  Allergies as of 11/25/2017      Reactions   No Known Allergies       Medication List        Accurate as of 11/25/17 11:59 PM. Always use your most recent med list.          acetaminophen 325 MG tablet Commonly known as:  TYLENOL Take 2 tablets (650 mg total) by mouth every 6 (six) hours as needed.   ADVIL PM PO Take 1 tablet at bedtime as needed by mouth.   albuterol 108 (90 Base) MCG/ACT inhaler Commonly known as:  PROAIR HFA Inhale 2 puffs into the lungs every 6 (six) hours.   aspirin 81 MG tablet Take 81 mg by mouth daily.   cholecalciferol 1000 units tablet Commonly known as:  VITAMIN D Take 1,000 Units by mouth daily.   famotidine 20 MG tablet Commonly known as:  PEPCID Take 20 mg at bedtime by mouth.   folic acid 1 MG tablet Commonly known as:  FOLVITE Take 1 tablet (1 mg total) by mouth daily.   ibuprofen 400 MG tablet Commonly known as:  ADVIL,MOTRIN Take 1 tablet (400 mg total) by mouth every 4 (four) hours as needed.   Krill Oil 1000 MG Caps Take by mouth.   levofloxacin 500 MG tablet Commonly known as:  LEVAQUIN Take 1 tablet (500 mg total) by mouth daily. For 10 days   levothyroxine 50 MCG tablet Commonly known as:  SYNTHROID, LEVOTHROID 1 TABLET EVERY MORNING BEFORE BREAKFAST-WAIT 1 HR AFTER TAKING BEFORE EATING OR DRINKING   NIFEdipine 60 MG 24 hr tablet Commonly known as:  PROCARDIA XL/ADALAT-CC TAKE 1 TABLET BY MOUTH EVERY DAY   predniSONE 10 MG tablet Commonly known as:  DELTASONE Take 5 daily for 3 days followed by 4,3,2 and 1 for 3 days each.     pseudoephedrine-guaifenesin 60-600 MG 12 hr tablet Commonly known as:  MUCINEX D Take 1 tablet every 12 (twelve) hours by mouth.   SYMBICORT 160-4.5 MCG/ACT inhaler Generic drug:  budesonide-formoterol TAKE 2 PUFFS BY MOUTH TWICE A DAY   valsartan-hydrochlorothiazide 320-25 MG tablet Commonly known as:  DIOVAN-HCT Take 1 tablet by mouth daily.   vitamin B-12 250 MCG tablet Commonly known as:  CYANOCOBALAMIN Take 250 mcg by mouth daily.        Follow-up: Return in about 1 month (around 12/26/2017), or if symptoms worsen or fail to improve.  Claretta Fraise, M.D.

## 2017-11-28 ENCOUNTER — Encounter: Payer: Self-pay | Admitting: Family Medicine

## 2017-11-29 DIAGNOSIS — H26492 Other secondary cataract, left eye: Secondary | ICD-10-CM | POA: Diagnosis not present

## 2017-11-29 DIAGNOSIS — H04123 Dry eye syndrome of bilateral lacrimal glands: Secondary | ICD-10-CM | POA: Diagnosis not present

## 2017-11-29 DIAGNOSIS — Z961 Presence of intraocular lens: Secondary | ICD-10-CM | POA: Diagnosis not present

## 2017-12-16 ENCOUNTER — Other Ambulatory Visit: Payer: Self-pay | Admitting: Family Medicine

## 2017-12-23 ENCOUNTER — Ambulatory Visit: Payer: Medicare Other | Admitting: Internal Medicine

## 2017-12-23 ENCOUNTER — Encounter: Payer: Self-pay | Admitting: Internal Medicine

## 2017-12-23 VITALS — BP 116/70 | HR 90 | Ht 69.0 in | Wt 199.8 lb

## 2017-12-23 DIAGNOSIS — J449 Chronic obstructive pulmonary disease, unspecified: Secondary | ICD-10-CM | POA: Diagnosis not present

## 2017-12-23 DIAGNOSIS — R058 Other specified cough: Secondary | ICD-10-CM

## 2017-12-23 DIAGNOSIS — R05 Cough: Secondary | ICD-10-CM

## 2017-12-23 NOTE — Progress Notes (Signed)
Subjective:    Patient ID: Frank Rosales, male    DOB: December 06, 1955    MRN: [id number]    Brief patient profile:  84 yowm "quit smoking"  08/12/16  with h/o exposure to Asbestosis at [place] ? Into the 80s  still able to walk  flat fine but steps x 3 flights sob with GOLD III copd 2013 prev eval by Dr Joya Gaskins  / gets annual f/u in [place]      History of Present Illness  02/16/2016  f/u ov/Jalaiyah Throgmorton re:  GOLD III copd /  symbicort 160 2bid / twice weekly saba  Chief Complaint  Patient presents with  . Follow-up    Pt states had CT Chest done 12/27/15- ordered by worker's comp for eval of previous asbestos exp. He states he had been doing well until July 2017 developed cough and congestion that lasted 2 months, but starting to improve.   sick July - September 2017 rx with abx/ steroids better since early Sept 2017 but CT was done in middle of this illness and was abnormal but was done for purpose of screening for asbestos  rec Plan A = Automatic = symbicort 160 Take 2 puffs first thing in am and then another 2 puffs about 12 hours later.  Work on inhaler technique:   Only use your albuterol(proair)  as a rescue medication  The key is to stop smoking completely before smoking completely stops you!      11/07/2016  f/u ov/Shastina Rua re: acute cough - maintained on symb 160 2bid  Chief Complaint  Patient presents with  . Follow-up    Pt c/o chest and nasal congestion, cough, increased SOB for the past wk. His cough is prod with very thick, clear sputum. He states "feels like I'm drowning"-seen by PCP on 6/22 and given steroid inj and abx with no relief.   acute onset with sore thorat gone w/in first day but then nasal / congestion rx by Dr Quinn Axe on 11/02/16 with pred/levaquin Mucus is now clear  Using saba 2 4 x daily  rec Plan A = Automatic = Symbicort 160  Take 2 puffs first thing in am and then another 2 puffs about 12 hours later.  Plan B = Backup Only use your albuterol as a rescue  medication Prednisone 10 mg take  4 each am x 2 days,   2 each am x 2 days,  1 each am x 2 days and stop  Try prilosec otc 20mg   Take 30-60 min before first meal of the day and Pepcid ac (famotidine) 20 mg one @  bedtime until cough is completely gone for at least a week without the need for cough suppression and stop the krill oil for the same reason until no cough at all  For cough /congestion > mucinex dm up to 1200 mg every 12 hours as needed and tramadol 50 mg up to 2 every 4 hours  For nasal congestion;  advil cold and sinus congestion every 4 hours as needed     12/21/2016  f/u ov/Issa Kosmicki re:  Copd gold III/AB  symb 160 2bid  Chief Complaint  Patient presents with  . Follow-up    PFT's done today. His breathing is doing well. He rarely uses albuterol. He c/o occ PND and cough with clear sputum.    doe now = MMRC2 = can't walk a nl pace on a flat grade s sob but does fine slow and flat   rec No change  rx    03/25/2017  f/u ov/Brynlea Spindler re:   COPD III/ maint on symb 160 2bid  Chief Complaint  Patient presents with  . Follow-up    Increased cough and PND for the past 4-5 days- mainly non prod.  He rarely uses his albuterol inhaler.    admits to occ smoking but can really feel it when he does and thinks he's finally done with them  Doe still = MMRC2  Only uses alb twice a month at most rec Plan A = Automatic = Bevespi Take 2 puffs first thing in am and then another 2 puffs about 12 hours later.  Plan B = Backup Only use your albuterol as a rescue medication Prednisone 10 mg take  4 each am x 2 days,   2 each am x 2 days,  1 each am x 2 days and stop     06/25/2017  f/u ov/Elissa Grieshop re:  Copd III still smoking/ preferred symb 160 over bevespi Chief Complaint  Patient presents with  . Follow-up    Breathing is overall doing well. He uses his albuterol 2 x per month on average.   Dyspnea:  Limited by back more than breathing but does ok leaning on cart /slow = MMRC3 = can't walk 100 yards  even at a slow pace at a flat grade s stopping due to sob and very rarely needing saba   Cough: min mostly am/ mucoid Sleep: no resp issues on 2 pillows rec No change     12/23/2017  f/u ov/Miroslav Gin re:   COPD III/ stopped smoking  July 2019  Chief Complaint  Patient presents with  . Follow-up    He c/o non prod cough- esp worse in the am's. Breathing is about the same. He has completely stopped smoking. He is using his rescue inhaler about once per wk.   Dyspnea:  MMRC3 = can't walk 100 yards even at a slow pace at a flat grade s stopping due to sob   Cough: very harsh only with activity indoor vs outdoor the same/ uses mucinex dm ? Dose   Sleeping: ok p otc sleep aide on 2 pillows SABA use: rare 02: none     No obvious day to day or daytime variability or assoc excess/ purulent sputum or mucus plugs or hemoptysis or cp or chest tightness, subjective wheeze or overt sinus or hb symptoms.   Sleeping as above without nocturnal  or early am exacerbation  of respiratory  c/o's or need for noct saba. Also denies any obvious fluctuation of symptoms with weather or environmental changes or other aggravating or alleviating factors except as outlined above   No unusual exposure hx or h/o childhood pna/ asthma or knowledge of premature birth.  Current Allergies, Complete Past Medical History, Past Surgical History, Family History, and Social History were reviewed in [employer] [employer] record.  ROS  The following are not active complaints unless bolded Hoarseness, sore throat, dysphagia, dental problems, itching, sneezing,  nasal congestion or discharge of excess mucus or purulent secretions, ear ache,   fever, chills, sweats, unintended wt loss or wt gain, classically pleuritic or exertional cp,  orthopnea pnd or arm/hand swelling  or leg swelling, presyncope, palpitations, abdominal pain, anorexia, nausea, vomiting, diarrhea  or change in bowel habits or change in bladder habits,  change in stools or change in urine, dysuria, hematuria,  rash, arthralgias, visual complaints, headache, numbness, weakness or ataxia or problems with walking or coordination,  change in  mood or  memory.        Current Meds  Medication Sig  . acetaminophen (TYLENOL) 325 MG tablet Take 2 tablets (650 mg total) by mouth every 6 (six) hours as needed.  Marland Kitchen albuterol (PROAIR HFA) 108 (90 Base) MCG/ACT inhaler Inhale 2 puffs into the lungs every 6 (six) hours.  Marland Kitchen aspirin 81 MG tablet Take 81 mg by mouth daily.   . cholecalciferol (VITAMIN D) 1000 UNITS tablet Take 1,000 Units by mouth daily.   Marland Kitchen dextromethorphan-guaiFENesin (MUCINEX DM) 30-600 MG 12hr tablet Take 1 tablet by mouth 2 (two) times daily as needed for cough.  . famotidine (PEPCID) 20 MG tablet Take 20 mg at bedtime by mouth.  . folic acid (FOLVITE) 1 MG tablet Take 1 tablet (1 mg total) by mouth daily.  Marland Kitchen ibuprofen (ADVIL,MOTRIN) 400 MG tablet Take 1 tablet (400 mg total) by mouth every 4 (four) hours as needed.  . Ibuprofen-Diphenhydramine Cit (ADVIL PM PO) Take 1 tablet at bedtime as needed by mouth.  Javier Docker Oil 1000 MG CAPS Take by mouth.  . levothyroxine (SYNTHROID, LEVOTHROID) 50 MCG tablet 1 TABLET EVERY MORNING BEFORE BREAKFAST-WAIT 1 HR AFTER TAKING BEFORE EATING OR DRINKING  . NIFEdipine (PROCARDIA-XL/ADALAT CC) 60 MG 24 hr tablet TAKE 1 TABLET BY MOUTH EVERY DAY  . SYMBICORT 160-4.5 MCG/ACT inhaler TAKE 2 PUFFS BY MOUTH TWICE A DAY  . valsartan-hydrochlorothiazide (DIOVAN-HCT) 320-25 MG tablet Take 1 tablet by mouth daily.  . vitamin B-12 (CYANOCOBALAMIN) 250 MCG tablet Take 250 mcg by mouth daily.                    Objective:   Physical Exam  amb wm nad /hoarse with harsh dry sounding cough    01/24/2015        180 > 02/21/2015  180  > 06/16/2015  186  > 02/16/2016  205 > 09/13/2016   185 >  11/07/2016  187 >  12/21/2016  195 > 03/25/2017   200 > 06/25/2017   198  > 12/23/2017   199     11/10/14 174 lb (78.926 kg)   11/08/14 176 lb 6.4 oz (80.015 kg)  10/27/14 175 lb (79.379 kg)     Vital signs reviewed - Note on arrival 02 sats  94% on RA    HEENT: nl dentition / oropharynx. Nl external ear canals without cough reflex -  Mild bilateral non-specific turbinate edema     NECK :  without JVD/Nodes/TM/ nl carotid upstrokes bilaterally   LUNGS: no acc muscle use,  Mod barrel  contour chest wall with bilateral  Distant bs s audible wheeze and  without cough on insp or exp maneuver and mod  Hyperresonant  to  percussion bilaterally     CV:  RRR  no s3 or murmur or increase in P2, and no edema   ABD:  soft and nontender with pos mid insp Hoover's  in the supine position. No bruits or organomegaly appreciated, bowel sounds nl  MS:   Nl gait/  ext warm without deformities, calf tenderness, cyanosis or clubbing No obvious joint restrictions   SKIN: warm and dry without lesions    NEURO:  alert, approp, nl sensorium with  no motor or cerebellar deficits apparent.                I personally reviewed images and agree with radiology impression as follows:  CXR:   11/25/17 1. COPD.  No active cardiopulmonary disease.  Assessment & Plan:

## 2017-12-23 NOTE — Patient Instructions (Addendum)
When coughing >  Stop the krill oil and any cough based vitamin and take mucienx dm 1200 mg every 12 hours    Please schedule a follow up visit in 6  months but call sooner if needed

## 2017-12-24 ENCOUNTER — Encounter: Payer: Self-pay | Admitting: Internal Medicine

## 2017-12-24 DIAGNOSIS — R058 Other specified cough: Secondary | ICD-10-CM | POA: Insufficient documentation

## 2017-12-24 DIAGNOSIS — R05 Cough: Secondary | ICD-10-CM | POA: Insufficient documentation

## 2017-12-24 NOTE — Assessment & Plan Note (Addendum)
Spirometry 03/2011 FeV1 49%    Spirometry  02/28/2012  FEV1 45%   - Last day worked = October 13 2014  - PFT's  01/24/2015  FEV1 1.59  (43 % ) ratio 54   p 22 % improvement from saba with DLCO  88 % corrects to 99 % for alv volume s symbicort  - PFTs  12/27/15 1.30 (35%) ratio 35  In midst of a flare-   - PFT's  12/21/2016  FEV1 1.40 (39 % ) ratio 50  p 8 % improvement from saba p symbicort 160 x 2  prior to study with DLCO  71/69 % corrects to 85  % for alv volume - PFTS  05/16/17  FEV1  1.32( 37%)  Ratio 37  And DLCO  52% > corrects to 78%  03/25/2017  try bevespi > preferred symb 160  - 12/23/2017  After extensive coaching inhaler device  effectiveness =    90%    Doing much better since quit smoking (re-inforced)  except for cough which has dry upper airway quality c/w uacs (see separate a/p)   Although he is Group B pattern copd he prefers symb so no change rx / can add lama to this if losing any ground with activity tol or having aecopd.  I had an extended discussion with the patient reviewing all relevant studies completed to date and  lasting 15 to 20 minutes of a 25 minute visit    See device teaching which extended face to face time for this visit.  Each maintenance medication was reviewed in detail including emphasizing most importantly the difference between maintenance and prns and under what circumstances the prns are to be triggered using an action plan format that is not reflected in the computer generated alphabetically organized AVS which I have not found useful in most complex patients, especially with respiratory illnesses  Please see AVS for specific instructions unique to this visit that I personally wrote and verbalized to the the pt in detail and then reviewed with pt  by my nurse highlighting any  changes in therapy recommended at today's visit to their plan of care.

## 2017-12-24 NOTE — Assessment & Plan Note (Signed)
Upper airway cough syndrome (previously labeled PNDS),  is so named because it's frequently impossible to sort out how much is  CR/sinusitis with freq throat clearing (which can be related to primary GERD)   vs  causing  secondary (" extra esophageal")  GERD from wide swings in gastric pressure that occur with throat clearing, often  promoting self use of mint and menthol lozenges that reduce the lower esophageal sphincter tone and exacerbate the problem further in a cyclical fashion.   These are the same pts (now being labeled as having "irritable larynx syndrome" by some cough centers) who not infrequently have a history of having failed to tolerate ace inhibitors,  dry powder inhalers or biphosphonates or report having atypical/extraesophageal reflux symptoms that don't respond to standard doses of PPI  and are easily confused as having aecopd or asthma flares by even experienced allergists/ pulmonologists (myself included).   rec max rx for gerd including diet / f/u w/in next 4 weeks if not improving/ o/w can see q 6 m

## 2017-12-27 ENCOUNTER — Ambulatory Visit (INDEPENDENT_AMBULATORY_CARE_PROVIDER_SITE_OTHER): Payer: Medicare Other | Admitting: Family Medicine

## 2017-12-27 ENCOUNTER — Encounter: Payer: Self-pay | Admitting: Family Medicine

## 2017-12-27 VITALS — BP 118/72 | HR 101 | Temp 97.2°F | Ht 69.0 in | Wt 203.2 lb

## 2017-12-27 DIAGNOSIS — D7589 Other specified diseases of blood and blood-forming organs: Secondary | ICD-10-CM | POA: Insufficient documentation

## 2017-12-27 DIAGNOSIS — Z0000 Encounter for general adult medical examination without abnormal findings: Secondary | ICD-10-CM | POA: Diagnosis not present

## 2017-12-27 DIAGNOSIS — M8580 Other specified disorders of bone density and structure, unspecified site: Secondary | ICD-10-CM

## 2017-12-27 DIAGNOSIS — Z125 Encounter for screening for malignant neoplasm of prostate: Secondary | ICD-10-CM | POA: Diagnosis not present

## 2017-12-27 DIAGNOSIS — E7800 Pure hypercholesterolemia, unspecified: Secondary | ICD-10-CM

## 2017-12-27 DIAGNOSIS — E039 Hypothyroidism, unspecified: Secondary | ICD-10-CM | POA: Diagnosis not present

## 2017-12-27 DIAGNOSIS — J449 Chronic obstructive pulmonary disease, unspecified: Secondary | ICD-10-CM | POA: Diagnosis not present

## 2017-12-27 MED ORDER — BUDESONIDE-FORMOTEROL FUMARATE 160-4.5 MCG/ACT IN AERO: INHALATION_SPRAY | RESPIRATORY_TRACT | 1 refills | Status: DC

## 2017-12-27 MED ORDER — VALSARTAN-HYDROCHLOROTHIAZIDE 320-25 MG PO TABS: 1.0000 | ORAL_TABLET | Freq: Every day | ORAL | 1 refills | Status: DC

## 2017-12-27 MED ORDER — CLOTRIMAZOLE-BETAMETHASONE 1-0.05 % EX CREA: 1.0000 "application " | TOPICAL_CREAM | Freq: Two times a day (BID) | CUTANEOUS | 0 refills | Status: AC

## 2017-12-27 MED ORDER — LEVOTHYROXINE SODIUM 50 MCG PO TABS: ORAL_TABLET | ORAL | 1 refills | Status: DC

## 2017-12-27 NOTE — Patient Instructions (Addendum)
DASH Eating Plan DASH stands for "Dietary Approaches to Stop Hypertension." The DASH eating plan is a healthy eating plan that has been shown to reduce high blood pressure (hypertension). It may also reduce your risk for type 2 diabetes, heart disease, and stroke. The DASH eating plan may also help with weight loss. What are tips for following this plan? General guidelines  Avoid eating more than 2,300 mg (milligrams) of salt (sodium) a day. If you have hypertension, you may need to reduce your sodium intake to 1,500 mg a day.  Limit alcohol intake to no more than 1 drink a day for nonpregnant women and 2 drinks a day for men. One drink equals 12 oz of beer, 5 oz of wine, or 1 oz of hard liquor.  Work with your health care provider to maintain a healthy body weight or to lose weight. Ask what an ideal weight is for you.  Get at least 30 minutes of exercise that causes your heart to beat faster (aerobic exercise) most days of the week. Activities may include walking, swimming, or biking.  Work with your health care provider or diet and nutrition specialist (dietitian) to adjust your eating plan to your individual calorie needs. Reading food labels  Check food labels for the amount of sodium per serving. Choose foods with less than 5 percent of the Daily Value of sodium. Generally, foods with less than 300 mg of sodium per serving fit into this eating plan.  To find whole grains, look for the word "whole" as the first word in the ingredient list. Shopping  Buy products labeled as "low-sodium" or "no salt added."  Buy fresh foods. Avoid canned foods and premade or frozen meals. Cooking  Avoid adding salt when cooking. Use salt-free seasonings or herbs instead of table salt or sea salt. Check with your health care provider or pharmacist before using salt substitutes.  Do not fry foods. Cook foods using healthy methods such as baking, boiling, grilling, and broiling instead.  Cook with  heart-healthy oils, such as olive, canola, soybean, or sunflower oil. Meal planning   Eat a balanced diet that includes: ? 5 or more servings of fruits and vegetables each day. At each meal, try to fill half of your plate with fruits and vegetables. ? Up to 6-8 servings of whole grains each day. ? Less than 6 oz of lean meat, poultry, or fish each day. A 3-oz serving of meat is about the same size as a deck of cards. One egg equals 1 oz. ? 2 servings of low-fat dairy each day. ? A serving of nuts, seeds, or beans 5 times each week. ? Heart-healthy fats. Healthy fats called Omega-3 fatty acids are found in foods such as flaxseeds and coldwater fish, like sardines, salmon, and mackerel.  Limit how much you eat of the following: ? Canned or prepackaged foods. ? Food that is high in trans fat, such as fried foods. ? Food that is high in saturated fat, such as fatty meat. ? Sweets, desserts, sugary drinks, and other foods with added sugar. ? Full-fat dairy products.  Do not salt foods before eating.  Try to eat at least 2 vegetarian meals each week.  Eat more home-cooked food and less restaurant, buffet, and fast food.  When eating at a restaurant, ask that your food be prepared with less salt or no salt, if possible. What foods are recommended? The items listed may not be a complete list. Talk with your dietitian about what   dietary choices are best for you. Grains Whole-grain or whole-wheat bread. Whole-grain or whole-wheat pasta. Brown rice. Oatmeal. Quinoa. Bulgur. Whole-grain and low-sodium cereals. Pita bread. Low-fat, low-sodium crackers. Whole-wheat flour tortillas. Vegetables Fresh or frozen vegetables (raw, steamed, roasted, or grilled). Low-sodium or reduced-sodium tomato and vegetable juice. Low-sodium or reduced-sodium tomato sauce and tomato paste. Low-sodium or reduced-sodium canned vegetables. Fruits All fresh, dried, or frozen fruit. Canned fruit in natural juice (without  added sugar). Meat and other protein foods Skinless chicken or turkey. Ground chicken or turkey. Pork with fat trimmed off. Fish and seafood. Egg whites. Dried beans, peas, or lentils. Unsalted nuts, nut butters, and seeds. Unsalted canned beans. Lean cuts of beef with fat trimmed off. Low-sodium, lean deli meat. Dairy Low-fat (1%) or fat-free (skim) milk. Fat-free, low-fat, or reduced-fat cheeses. Nonfat, low-sodium ricotta or cottage cheese. Low-fat or nonfat yogurt. Low-fat, low-sodium cheese. Fats and oils Soft margarine without trans fats. Vegetable oil. Low-fat, reduced-fat, or light mayonnaise and salad dressings (reduced-sodium). Canola, safflower, olive, soybean, and sunflower oils. Avocado. Seasoning and other foods Herbs. Spices. Seasoning mixes without salt. Unsalted popcorn and pretzels. Fat-free sweets. What foods are not recommended? The items listed may not be a complete list. Talk with your dietitian about what dietary choices are best for you. Grains Baked goods made with fat, such as croissants, muffins, or some breads. Dry pasta or rice meal packs. Vegetables Creamed or fried vegetables. Vegetables in a cheese sauce. Regular canned vegetables (not low-sodium or reduced-sodium). Regular canned tomato sauce and paste (not low-sodium or reduced-sodium). Regular tomato and vegetable juice (not low-sodium or reduced-sodium). Pickles. Olives. Fruits Canned fruit in a light or heavy syrup. Fried fruit. Fruit in cream or butter sauce. Meat and other protein foods Fatty cuts of meat. Ribs. Fried meat. Bacon. Sausage. Bologna and other processed lunch meats. Salami. Fatback. Hotdogs. Bratwurst. Salted nuts and seeds. Canned beans with added salt. Canned or smoked fish. Whole eggs or egg yolks. Chicken or turkey with skin. Dairy Whole or 2% milk, cream, and half-and-half. Whole or full-fat cream cheese. Whole-fat or sweetened yogurt. Full-fat cheese. Nondairy creamers. Whipped toppings.  Processed cheese and cheese spreads. Fats and oils Butter. Stick margarine. Lard. Shortening. Ghee. Bacon fat. Tropical oils, such as coconut, palm kernel, or palm oil. Seasoning and other foods Salted popcorn and pretzels. Onion salt, garlic salt, seasoned salt, table salt, and sea salt. Worcestershire sauce. Tartar sauce. Barbecue sauce. Teriyaki sauce. Soy sauce, including reduced-sodium. Steak sauce. Canned and packaged gravies. Fish sauce. Oyster sauce. Cocktail sauce. Horseradish that you find on the shelf. Ketchup. Mustard. Meat flavorings and tenderizers. Bouillon cubes. Hot sauce and Tabasco sauce. Premade or packaged marinades. Premade or packaged taco seasonings. Relishes. Regular salad dressings. Where to find more information:  National Heart, Lung, and Blood Institute: www.nhlbi.nih.gov  American Heart Association: www.heart.org Summary  The DASH eating plan is a healthy eating plan that has been shown to reduce high blood pressure (hypertension). It may also reduce your risk for type 2 diabetes, heart disease, and stroke.  With the DASH eating plan, you should limit salt (sodium) intake to 2,300 mg a day. If you have hypertension, you may need to reduce your sodium intake to 1,500 mg a day.  When on the DASH eating plan, aim to eat more fresh fruits and vegetables, whole grains, lean proteins, low-fat dairy, and heart-healthy fats.  Work with your health care provider or diet and nutrition specialist (dietitian) to adjust your eating plan to your individual   calorie needs. This information is not intended to replace advice given to you by your health care provider. Make sure you discuss any questions you have with your health care provider. Document Released: 04/19/2011 Document Revised: 04/23/2016 Document Reviewed: 04/23/2016 Elsevier Interactive Patient Education  2018 [employer].    Use Debrox drops daily in left ear for 7 days!

## 2017-12-27 NOTE — Progress Notes (Signed)
Subjective:  Patient ID: Frank Rosales, male    DOB: 1956-01-17  Age: 62 y.o. MRN: [id number]  CC: Annual Exam   HPI Frank Rosales presents for Annual physical. States breathing doing well now. Has a rash for 2 weeks to look at.  Depression screen [facility] 2/9 12/27/2017 11/23/2017 09/30/2017  Decreased Interest 0 0 0  Down, Depressed, Hopeless 0 0 0  PHQ - 2 Score 0 0 0  Altered sleeping - - -  Tired, decreased energy - - -  Change in appetite - - -  Feeling bad or failure about yourself  - - -  Trouble concentrating - - -  Moving slowly or fidgety/restless - - -  Suicidal thoughts - - -  PHQ-9 Score - - -    History Frank Rosales has a past medical history of Anxiety, Arthritis, Asthma, Colon polyps, COPD (chronic obstructive pulmonary disease) ([place]), High blood pressure, High cholesterol, Hilar adenopathy, Hypothyroidism, and Multiple rib fractures (08/2016).   Frank Rosales has a past surgical history that includes Knee arthroscopy (Right, 05/14/2010); Wisdom tooth extraction; epidural injections; Fixation kyphoplasty lumbar spine; Back surgery; Video assisted thoracoscopy (Left, 08/22/2016); Pleural effusion drainage (Left, 08/22/2016); and Rib plating (Left, 08/22/2016).   His family history includes Emphysema in his father; Heart disease in his brother; Heart failure in his mother; Lung cancer in his father.Frank Rosales reports that Frank Rosales quit smoking about 6 weeks ago. His smoking use included cigarettes. Frank Rosales has a 70.00 pack-year smoking history. Frank Rosales has never used smokeless tobacco. Frank Rosales reports that Frank Rosales drinks about 42.0 standard drinks of alcohol per week. Frank Rosales reports that Frank Rosales does not use drugs.    ROS Review of Systems  Constitutional: Negative for activity change, fatigue and unexpected weight change.  HENT: Negative for congestion, ear pain, hearing loss, postnasal drip and trouble swallowing.   Eyes: Negative for pain and visual disturbance.  Respiratory: Negative for cough, chest tightness and shortness  of breath.   Cardiovascular: Negative for chest pain, palpitations and leg swelling.  Gastrointestinal: Negative for abdominal distention, abdominal pain, blood in stool, constipation, diarrhea, nausea and vomiting.  Endocrine: Negative for cold intolerance, heat intolerance and polydipsia.  Genitourinary: Negative for difficulty urinating, dysuria, flank pain, frequency and urgency.  Musculoskeletal: Negative for arthralgias and joint swelling.  Skin: Negative for color change, rash and wound.  Neurological: Negative for dizziness, syncope, speech difficulty, weakness, light-headedness, numbness and headaches.  Hematological: Does not bruise/bleed easily.  Psychiatric/Behavioral: Negative for confusion, decreased concentration, dysphoric mood and sleep disturbance. The patient is not nervous/anxious.     Objective:  BP 118/72   Pulse (!) 101   Temp (!) 97.2 F (36.2 C) (Oral)   Ht _0  (1.753 m)   Wt 203 lb 4 oz (92.2 kg)   SpO2 94%   BMI 30.01 kg/m   BP Readings from Last 3 Encounters:  12/27/17 118/72  12/23/17 116/70  11/25/17 111/75    Wt Readings from Last 3 Encounters:  12/27/17 203 lb 4 oz (92.2 kg)  12/23/17 199 lb 12.8 oz (90.6 kg)  11/25/17 194 lb 8 oz (88.2 kg)     Physical Exam  Constitutional: Frank Rosales is oriented to person, place, and time. Frank Rosales appears well-developed and well-nourished.  HENT:  Head: Normocephalic and atraumatic.  Mouth/Throat: Oropharynx is clear and moist.  Eyes: Pupils are equal, round, and reactive to light. EOM are normal.  Neck: Normal range of motion. No tracheal deviation present. No thyromegaly present.  Cardiovascular: Normal rate, regular  rhythm and normal heart sounds. Exam reveals no gallop and no friction rub.  No murmur heard. Pulmonary/Chest: Breath sounds normal. Frank Rosales has no wheezes. Frank Rosales has no rales.  Abdominal: Soft. Bowel sounds are normal. Frank Rosales exhibits no distension and no mass. There is no tenderness. Hernia confirmed negative  in the right inguinal area and confirmed negative in the left inguinal area.  Genitourinary: Testes normal and penis normal.  Musculoskeletal: Normal range of motion. Frank Rosales exhibits no edema.  Lymphadenopathy:    Frank Rosales has no cervical adenopathy.  Neurological: Frank Rosales is alert and oriented to person, place, and time.  Skin: Skin is warm and dry. Rash (erythema with fien scale over anterior chest. Minimal in axillae) noted.  Psychiatric: Frank Rosales has a normal mood and affect.      PFT confirms severe COPD with low FVC  Assessment & Plan:   [name] was seen today for annual exam.  Diagnoses and all orders for this visit:  Well adult exam  COPD GOLD III  -     PR BREATHING CAPACITY TEST -     budesonide-formoterol (SYMBICORT) 160-4.5 MCG/ACT inhaler; TAKE 2 PUFFS BY MOUTH TWICE A DAY  Hypothyroidism, unspecified type -     TSH -     T4, Free  High cholesterol -     CBC with Differential/Platelet -     CMP14+EGFR -     Lipid panel  Osteopenia, unspecified location -     VITAMIN D 25 Hydroxy (Vit-D Deficiency, Fractures)  Macrocytosis -     Vitamin B12 -     Folate  Special screening for malignant neoplasm of prostate -     PSA, total and free -     Urinalysis  Other orders -     clotrimazole-betamethasone (LOTRISONE) cream; Apply 1 application topically 2 (two) times daily for 14 days. -     levothyroxine (SYNTHROID, LEVOTHROID) 50 MCG tablet; 1 TABLET EVERY MORNING BEFORE BREAKFAST-WAIT 1 HR AFTER TAKING BEFORE EATING OR DRINKING -     valsartan-hydrochlorothiazide (DIOVAN-HCT) 320-25 MG tablet; Take 1 tablet by mouth daily.       I have changed Frank Rosales's SYMBICORT to budesonide-formoterol. I am also having him start on clotrimazole-betamethasone. Additionally, I am having him maintain his aspirin, cholecalciferol, vitamin M-60, folic acid, acetaminophen, ibuprofen, famotidine, Ibuprofen-Diphenhydramine Cit (ADVIL PM PO), Krill Oil, albuterol, NIFEdipine,  dextromethorphan-guaiFENesin, levothyroxine, and valsartan-hydrochlorothiazide.  Allergies as of 12/27/2017      Reactions   No Known Allergies       Medication List        Accurate as of 12/27/17 10:15 AM. Always use your most recent med list.          acetaminophen 325 MG tablet Commonly known as:  TYLENOL Take 2 tablets (650 mg total) by mouth every 6 (six) hours as needed.   ADVIL PM PO Take 1 tablet at bedtime as needed by mouth.   albuterol 108 (90 Base) MCG/ACT inhaler Commonly known as:  PROVENTIL HFA;VENTOLIN HFA Inhale 2 puffs into the lungs every 6 (six) hours.   aspirin 81 MG tablet Take 81 mg by mouth daily.   budesonide-formoterol 160-4.5 MCG/ACT inhaler Commonly known as:  SYMBICORT TAKE 2 PUFFS BY MOUTH TWICE A DAY   cholecalciferol 1000 units tablet Commonly known as:  VITAMIN D Take 1,000 Units by mouth daily.   clotrimazole-betamethasone cream Commonly known as:  LOTRISONE Apply 1 application topically 2 (two) times daily for 14 days.   dextromethorphan-guaiFENesin 30-600 MG  12hr tablet Commonly known as:  MUCINEX DM Take 1 tablet by mouth 2 (two) times daily as needed for cough.   famotidine 20 MG tablet Commonly known as:  PEPCID Take 20 mg at bedtime by mouth.   folic acid 1 MG tablet Commonly known as:  FOLVITE Take 1 tablet (1 mg total) by mouth daily.   ibuprofen 400 MG tablet Commonly known as:  ADVIL,MOTRIN Take 1 tablet (400 mg total) by mouth every 4 (four) hours as needed.   Krill Oil 1000 MG Caps Take by mouth.   levothyroxine 50 MCG tablet Commonly known as:  SYNTHROID, LEVOTHROID 1 TABLET EVERY MORNING BEFORE BREAKFAST-WAIT 1 HR AFTER TAKING BEFORE EATING OR DRINKING   NIFEdipine 60 MG 24 hr tablet Commonly known as:  PROCARDIA-XL/ADALAT CC TAKE 1 TABLET BY MOUTH EVERY DAY   valsartan-hydrochlorothiazide 320-25 MG tablet Commonly known as:  DIOVAN-HCT Take 1 tablet by mouth daily.   vitamin B-12 250 MCG  tablet Commonly known as:  CYANOCOBALAMIN Take 250 mcg by mouth daily.        Follow-up: Return in about 6 months (around 06/29/2018).  Claretta Fraise, M.D.

## 2017-12-28 LAB — CBC WITH DIFFERENTIAL/PLATELET
BASOS ABS: 0.1 10*3/uL (ref 0.0–0.2)
Basos: 1 %
EOS (ABSOLUTE): 0.2 10*3/uL (ref 0.0–0.4)
Eos: 2 %
Hematocrit: 47.4 % (ref 37.5–51.0)
Hemoglobin: 16.4 g/dL (ref 13.0–17.7)
IMMATURE GRANS (ABS): 0 10*3/uL (ref 0.0–0.1)
Immature Granulocytes: 0 %
LYMPHS ABS: 1.7 10*3/uL (ref 0.7–3.1)
LYMPHS: 19 %
MCH: 35.4 pg — AB (ref 26.6–33.0)
MCHC: 34.6 g/dL (ref 31.5–35.7)
MCV: 102 fL — ABNORMAL HIGH (ref 79–97)
Monocytes Absolute: 0.8 10*3/uL (ref 0.1–0.9)
Monocytes: 8 %
NEUTROS ABS: 6.5 10*3/uL (ref 1.4–7.0)
Neutrophils: 70 %
Platelets: 270 10*3/uL (ref 150–450)
RBC: 4.63 x10E6/uL (ref 4.14–5.80)
RDW: 14.5 % (ref 12.3–15.4)
WBC: 9.2 10*3/uL (ref 3.4–10.8)

## 2017-12-28 LAB — CMP14+EGFR
ALK PHOS: 97 IU/L (ref 39–117)
ALT: 21 IU/L (ref 0–44)
AST: 16 IU/L (ref 0–40)
Albumin/Globulin Ratio: 1.6 (ref 1.2–2.2)
Albumin: 4.3 g/dL (ref 3.5–5.5)
BILIRUBIN TOTAL: 0.6 mg/dL (ref 0.0–1.2)
BUN/Creatinine Ratio: 7 — ABNORMAL LOW (ref 9–20)
BUN: 6 mg/dL (ref 6–24)
CHLORIDE: 94 mmol/L — AB (ref 96–106)
CO2: 22 mmol/L (ref 20–29)
Calcium: 9.5 mg/dL (ref 8.7–10.2)
Creatinine, Ser: 0.9 mg/dL (ref 0.76–1.27)
GFR calc Af Amer: 108 mL/min/{1.73_m2} (ref >59)
GFR calc non Af Amer: 93 mL/min/{1.73_m2} (ref >59)
GLUCOSE: 145 mg/dL — AB (ref 65–99)
Globulin, Total: 2.7 g/dL (ref 1.5–4.5)
POTASSIUM: 3.8 mmol/L (ref 3.5–5.2)
Sodium: 135 mmol/L (ref 134–144)
Total Protein: 7 g/dL (ref 6.0–8.5)

## 2017-12-28 LAB — T4, FREE: FREE T4: 1.47 ng/dL (ref 0.82–1.77)

## 2017-12-28 LAB — LIPID PANEL
CHOLESTEROL TOTAL: 195 mg/dL (ref 100–199)
Chol/HDL Ratio: 3.7 ratio (ref 0.0–5.0)
HDL: 53 mg/dL (ref >39)
LDL Calculated: 122 mg/dL — ABNORMAL HIGH (ref 0–99)
Triglycerides: 101 mg/dL (ref 0–149)
VLDL CHOLESTEROL CAL: 20 mg/dL (ref 5–40)

## 2017-12-28 LAB — PSA, TOTAL AND FREE
PSA FREE PCT: 19.4 %
PSA FREE: 0.31 ng/mL
Prostate Specific Ag, Serum: 1.6 ng/mL (ref 0.0–4.0)

## 2017-12-28 LAB — VITAMIN B12: VITAMIN B 12: 1903 pg/mL — AB (ref 232–1245)

## 2017-12-28 LAB — TSH: TSH: 1.62 u[IU]/mL (ref 0.450–4.500)

## 2017-12-28 LAB — VITAMIN D 25 HYDROXY (VIT D DEFICIENCY, FRACTURES): Vit D, 25-Hydroxy: 38.3 ng/mL (ref 30.0–100.0)

## 2017-12-28 LAB — FOLATE: Folate: >20 ng/mL (ref >3.0)

## 2018-01-28 DIAGNOSIS — M48062 Spinal stenosis, lumbar region with neurogenic claudication: Secondary | ICD-10-CM | POA: Diagnosis not present

## 2018-01-28 DIAGNOSIS — M4726 Other spondylosis with radiculopathy, lumbar region: Secondary | ICD-10-CM | POA: Diagnosis not present

## 2018-01-28 DIAGNOSIS — M5136 Other intervertebral disc degeneration, lumbar region: Secondary | ICD-10-CM | POA: Diagnosis not present

## 2018-02-07 ENCOUNTER — Telehealth: Payer: Self-pay | Admitting: Family Medicine

## 2018-02-20 ENCOUNTER — Ambulatory Visit (INDEPENDENT_AMBULATORY_CARE_PROVIDER_SITE_OTHER): Payer: Medicare Other

## 2018-02-20 DIAGNOSIS — Z23 Encounter for immunization: Secondary | ICD-10-CM | POA: Diagnosis not present

## 2018-04-16 ENCOUNTER — Telehealth: Payer: Self-pay | Admitting: Family Medicine

## 2018-04-17 ENCOUNTER — Telehealth: Payer: Self-pay | Admitting: Family Medicine

## 2018-04-17 NOTE — Telephone Encounter (Signed)
Please advise on alternative.  

## 2018-04-18 ENCOUNTER — Other Ambulatory Visit: Payer: Self-pay | Admitting: Family Medicine

## 2018-04-18 MED ORDER — OLMESARTAN MEDOXOMIL 40 MG PO TABS: 40.0000 mg | ORAL_TABLET | Freq: Every day | ORAL | 1 refills | Status: DC

## 2018-04-18 NOTE — Telephone Encounter (Signed)
I sent in the requested prescription 

## 2018-04-18 NOTE — Telephone Encounter (Signed)
Pt aware of new rx sent to the pharmacy.

## 2018-04-24 NOTE — Telephone Encounter (Signed)
Attempted to contact pt without return call in over 3 days, without return call will close encounter.

## 2018-04-29 DIAGNOSIS — Z029 Encounter for administrative examinations, unspecified: Secondary | ICD-10-CM

## 2018-05-02 ENCOUNTER — Encounter: Payer: Self-pay | Admitting: Physician Assistant

## 2018-05-02 ENCOUNTER — Ambulatory Visit: Payer: Medicare Other | Admitting: Physician Assistant

## 2018-05-02 VITALS — BP 138/83 | HR 125 | Temp 97.6°F | Ht 69.0 in | Wt 207.4 lb

## 2018-05-02 DIAGNOSIS — L2084 Intrinsic (allergic) eczema: Secondary | ICD-10-CM

## 2018-05-02 MED ORDER — HALOBETASOL PROPIONATE 0.05 % EX OINT: TOPICAL_OINTMENT | Freq: Two times a day (BID) | CUTANEOUS | 5 refills | Status: DC

## 2018-05-02 MED ORDER — METHYLPREDNISOLONE ACETATE 80 MG/ML IJ SUSP
80.0000 mg | Freq: Once | INTRAMUSCULAR | Status: AC
  Administered 2018-05-02: 80 mg via INTRAMUSCULAR

## 2018-05-02 MED ORDER — PREDNISONE 10 MG PO TABS: 20.0000 mg | ORAL_TABLET | Freq: Every day | ORAL | 1 refills | Status: DC

## 2018-05-05 LAB — CBC WITH DIFFERENTIAL/PLATELET
BASOS ABS: 0.1 10*3/uL (ref 0.0–0.2)
Basos: 1 %
EOS (ABSOLUTE): 0.3 10*3/uL (ref 0.0–0.4)
EOS: 3 %
HEMATOCRIT: 53.9 % — AB (ref 37.5–51.0)
HEMOGLOBIN: 18.8 g/dL — AB (ref 13.0–17.7)
Immature Grans (Abs): 0.1 10*3/uL (ref 0.0–0.1)
Immature Granulocytes: 1 %
LYMPHS ABS: 1.1 10*3/uL (ref 0.7–3.1)
Lymphs: 11 %
MCH: 33.8 pg — AB (ref 26.6–33.0)
MCHC: 34.9 g/dL (ref 31.5–35.7)
MCV: 97 fL (ref 79–97)
MONOCYTES: 8 %
Monocytes Absolute: 0.8 10*3/uL (ref 0.1–0.9)
NEUTROS ABS: 8.3 10*3/uL — AB (ref 1.4–7.0)
Neutrophils: 76 %
Platelets: 250 10*3/uL (ref 150–450)
RBC: 5.56 x10E6/uL (ref 4.14–5.80)
RDW: 13.2 % (ref 12.3–15.4)
WBC: 10.7 10*3/uL (ref 3.4–10.8)

## 2018-05-05 LAB — CMP14+EGFR
ALT: 32 IU/L (ref 0–44)
AST: 24 IU/L (ref 0–40)
Albumin/Globulin Ratio: 1.8 (ref 1.2–2.2)
Albumin: 4.8 g/dL (ref 3.6–4.8)
Alkaline Phosphatase: 104 IU/L (ref 39–117)
BUN/Creatinine Ratio: 10 (ref 10–24)
BUN: 10 mg/dL (ref 8–27)
Bilirubin Total: 1.1 mg/dL (ref 0.0–1.2)
CHLORIDE: 97 mmol/L (ref 96–106)
CO2: 21 mmol/L (ref 20–29)
Calcium: 9.5 mg/dL (ref 8.6–10.2)
Creatinine, Ser: 0.97 mg/dL (ref 0.76–1.27)
GFR calc Af Amer: 98 mL/min/{1.73_m2} (ref >59)
GFR calc non Af Amer: 84 mL/min/{1.73_m2} (ref >59)
GLUCOSE: 116 mg/dL — AB (ref 65–99)
Globulin, Total: 2.7 g/dL (ref 1.5–4.5)
Potassium: 4.1 mmol/L (ref 3.5–5.2)
Sodium: 139 mmol/L (ref 134–144)
Total Protein: 7.5 g/dL (ref 6.0–8.5)

## 2018-05-05 LAB — LYME AB/WESTERN BLOT REFLEX
LYME DISEASE AB, QUANT, IGM: <0.8 index (ref 0.00–0.79)
Lyme IgG/IgM Ab: <0.91 {ISR} (ref 0.00–0.90)

## 2018-05-05 NOTE — Progress Notes (Signed)
BP 138/83   Pulse (!) 125   Temp 97.6 F (36.4 C) (Oral)   Ht 5' 9"  (1.753 m)   Wt 207 lb 6.4 oz (94.1 kg)   SpO2 92%   BMI 30.63 kg/m    Subjective:    Patient ID: Frank Rosales, male    DOB: [birth year], 62 y.o.   MRN: [id number]  HPI: Frank Rosales is a 62 y.o. male presenting on 05/02/2018 for Rash (all over body )    Past Medical History:  Diagnosis Date  . Anxiety   . Arthritis    "neck; lower back; right hip; right knee" (10/18/2014)  . Asthma   . Colon polyps   . COPD (chronic obstructive pulmonary disease) ([place])   . High blood pressure   . High cholesterol   . Hilar adenopathy   . Hypothyroidism   . Multiple rib fractures 08/2016   left side   Relevant past medical, surgical, family and social history reviewed and updated as indicated. Interim medical history since our last visit reviewed. Allergies and medications reviewed and updated. DATA REVIEWED: CHART IN EPIC  Family History reviewed for pertinent findings.  Review of Systems  Constitutional: Negative.  Negative for appetite change and fatigue.  HENT: Negative.   Eyes: Negative.  Negative for pain and visual disturbance.  Respiratory: Negative.  Negative for cough, chest tightness, shortness of breath and wheezing.   Cardiovascular: Negative.  Negative for chest pain, palpitations and leg swelling.  Gastrointestinal: Negative.  Negative for abdominal pain, diarrhea, nausea and vomiting.  Endocrine: Negative.   Genitourinary: Negative.   Musculoskeletal: Negative.   Skin: Negative.  Negative for color change and rash.  Neurological: Negative.  Negative for weakness, numbness and headaches.  Psychiatric/Behavioral: Negative.     Allergies as of 05/02/2018      Reactions   No Known Allergies       Medication List       Accurate as of May 02, 2018 11:59 PM. Always use your most recent med list.        acetaminophen 325 MG tablet Commonly known as:  TYLENOL Take 2 tablets (650 mg  total) by mouth every 6 (six) hours as needed.   ADVIL PM PO Take 1 tablet at bedtime as needed by mouth.   albuterol 108 (90 Base) MCG/ACT inhaler Commonly known as:  PROAIR HFA Inhale 2 puffs into the lungs every 6 (six) hours.   aspirin 81 MG tablet Take 81 mg by mouth daily.   budesonide-formoterol 160-4.5 MCG/ACT inhaler Commonly known as:  SYMBICORT TAKE 2 PUFFS BY MOUTH TWICE A DAY   cholecalciferol 1000 units tablet Commonly known as:  VITAMIN D Take 1,000 Units by mouth daily.   dextromethorphan-guaiFENesin 30-600 MG 12hr tablet Commonly known as:  MUCINEX DM Take 1 tablet by mouth 2 (two) times daily as needed for cough.   famotidine 20 MG tablet Commonly known as:  PEPCID Take 20 mg at bedtime by mouth.   folic acid 1 MG tablet Commonly known as:  FOLVITE Take 1 tablet (1 mg total) by mouth daily.   halobetasol 0.05 % ointment Commonly known as:  ULTRAVATE Apply topically 2 (two) times daily.   ibuprofen 400 MG tablet Commonly known as:  ADVIL,MOTRIN Take 1 tablet (400 mg total) by mouth every 4 (four) hours as needed.   Krill Oil 1000 MG Caps Take by mouth.   levothyroxine 50 MCG tablet Commonly known as:  SYNTHROID, LEVOTHROID 1 TABLET EVERY  MORNING BEFORE BREAKFAST-WAIT 1 HR AFTER TAKING BEFORE EATING OR DRINKING   NIFEdipine 60 MG 24 hr tablet Commonly known as:  ADALAT CC TAKE 1 TABLET BY MOUTH EVERY DAY   olmesartan 40 MG tablet Commonly known as:  BENICAR Take 1 tablet (40 mg total) by mouth daily. For blood pressure   predniSONE 10 MG tablet Commonly known as:  DELTASONE Take 2 tablets (20 mg total) by mouth daily with breakfast. After 1 week, take 1 tablet daily for one week. Then 1/2 tab daily.   vitamin B-12 250 MCG tablet Commonly known as:  CYANOCOBALAMIN Take 250 mcg by mouth daily.          Objective:    BP 138/83   Pulse (!) 125   Temp 97.6 F (36.4 C) (Oral)   Ht 5' 9"  (1.753 m)   Wt 207 lb 6.4 oz (94.1 kg)   SpO2  92%   BMI 30.63 kg/m   Allergies  Allergen Reactions  . No Known Allergies     Wt Readings from Last 3 Encounters:  05/02/18 207 lb 6.4 oz (94.1 kg)  12/27/17 203 lb 4 oz (92.2 kg)  12/23/17 199 lb 12.8 oz (90.6 kg)    Physical Exam Vitals signs and nursing note reviewed.  Constitutional:      General: He is not in acute distress.    Appearance: He is well-developed.  HENT:     Head: Normocephalic and atraumatic.  Eyes:     Conjunctiva/sclera: Conjunctivae normal.     Pupils: Pupils are equal, round, and reactive to light.  Cardiovascular:     Rate and Rhythm: Normal rate and regular rhythm.     Heart sounds: Normal heart sounds.  Pulmonary:     Effort: Pulmonary effort is normal. No respiratory distress.     Breath sounds: Normal breath sounds.  Skin:    General: Skin is warm and dry.     Findings: Erythema and rash present. Rash is scaling and urticarial.          Comments: Red confluent swollen red rash on face, upper trunk, arms  Psychiatric:        Behavior: Behavior normal.     Results for orders placed or performed in visit on 05/02/18  Lyme Ab/Western Blot Reflex  Result Value Ref Range   Lyme IgG/IgM Ab WILL FOLLOW    LYME DISEASE AB, QUANT, IGM WILL FOLLOW   CBC with Differential/Platelet  Result Value Ref Range   WBC 10.7 3.4 - 10.8 x10E3/uL   RBC 5.56 4.14 - 5.80 x10E6/uL   Hemoglobin 18.8 (H) 13.0 - 17.7 g/dL   Hematocrit 53.9 (H) 37.5 - 51.0 %   MCV 97 79 - 97 fL   MCH 33.8 (H) 26.6 - 33.0 pg   MCHC 34.9 31.5 - 35.7 g/dL   RDW 13.2 12.3 - 15.4 %   Platelets 250 150 - 450 x10E3/uL   Neutrophils 76 Not Estab. %   Lymphs 11 Not Estab. %   Monocytes 8 Not Estab. %   Eos 3 Not Estab. %   Basos 1 Not Estab. %   Neutrophils Absolute 8.3 (H) 1.4 - 7.0 x10E3/uL   Lymphocytes Absolute 1.1 0.7 - 3.1 x10E3/uL   Monocytes Absolute 0.8 0.1 - 0.9 x10E3/uL   EOS (ABSOLUTE) 0.3 0.0 - 0.4 x10E3/uL   Basophils Absolute 0.1 0.0 - 0.2 x10E3/uL   Immature  Granulocytes 1 Not Estab. %   Immature Grans (Abs) 0.1 0.0 - 0.1  x10E3/uL  CMP14+EGFR  Result Value Ref Range   Glucose 116 (H) 65 - 99 mg/dL   BUN 10 8 - 27 mg/dL   Creatinine, Ser 0.97 0.76 - 1.27 mg/dL   GFR calc non Af Amer 84 >59 mL/min/1.73   GFR calc Af Amer 98 >59 mL/min/1.73   BUN/Creatinine Ratio 10 10 - 24   Sodium 139 134 - 144 mmol/L   Potassium 4.1 3.5 - 5.2 mmol/L   Chloride 97 96 - 106 mmol/L   CO2 21 20 - 29 mmol/L   Calcium 9.5 8.6 - 10.2 mg/dL   Total Protein 7.5 6.0 - 8.5 g/dL   Albumin 4.8 3.6 - 4.8 g/dL   Globulin, Total 2.7 1.5 - 4.5 g/dL   Albumin/Globulin Ratio 1.8 1.2 - 2.2   Bilirubin Total 1.1 0.0 - 1.2 mg/dL   Alkaline Phosphatase 104 39 - 117 IU/L   AST 24 0 - 40 IU/L   ALT 32 0 - 44 IU/L      Assessment & Plan:   1. Intrinsic eczema - Lyme Ab/Western Blot Reflex - CBC with Differential/Platelet - CMP14+EGFR - halobetasol (ULTRAVATE) 0.05 % ointment; Apply topically 2 (two) times daily.  Dispense: 100 g; Refill: 5 - predniSONE (DELTASONE) 10 MG tablet; Take 2 tablets (20 mg total) by mouth daily with breakfast. After 1 week, take 1 tablet daily for one week. Then 1/2 tab daily.  Dispense: 40 tablet; Refill: 1 - methylPREDNISolone acetate (DEPO-MEDROL) injection 80 mg   Continue all other maintenance medications as listed above.  Follow up plan: No follow-ups on file.  Educational handout given for [place] PA-C [facility] [address]  [place], [place] [address] [phone]   05/05/2018, 1:12 PM

## 2018-05-12 ENCOUNTER — Other Ambulatory Visit: Payer: Self-pay | Admitting: *Deleted

## 2018-05-12 DIAGNOSIS — D751 Secondary polycythemia: Secondary | ICD-10-CM

## 2018-05-19 ENCOUNTER — Encounter: Payer: Self-pay | Admitting: Physician Assistant

## 2018-05-19 ENCOUNTER — Ambulatory Visit (INDEPENDENT_AMBULATORY_CARE_PROVIDER_SITE_OTHER): Payer: Medicare Other

## 2018-05-19 ENCOUNTER — Ambulatory Visit (INDEPENDENT_AMBULATORY_CARE_PROVIDER_SITE_OTHER): Payer: Medicare Other | Admitting: Physician Assistant

## 2018-05-19 ENCOUNTER — Ambulatory Visit (HOSPITAL_COMMUNITY): Payer: Medicare Other | Admitting: Internal Medicine

## 2018-05-19 VITALS — BP 132/77 | HR 111 | Temp 98.9°F | Ht 69.0 in | Wt 198.4 lb

## 2018-05-19 DIAGNOSIS — J449 Chronic obstructive pulmonary disease, unspecified: Secondary | ICD-10-CM

## 2018-05-19 DIAGNOSIS — J189 Pneumonia, unspecified organism: Secondary | ICD-10-CM | POA: Diagnosis not present

## 2018-05-19 MED ORDER — CEFTRIAXONE SODIUM 1 G IJ SOLR
1.0000 g | Freq: Once | INTRAMUSCULAR | Status: AC
  Administered 2018-05-19: 1 g via INTRAMUSCULAR

## 2018-05-19 MED ORDER — ALBUTEROL SULFATE (2.5 MG/3ML) 0.083% IN NEBU: 2.5000 mg | INHALATION_SOLUTION | Freq: Four times a day (QID) | RESPIRATORY_TRACT | 5 refills | Status: DC | PRN

## 2018-05-19 MED ORDER — LEVOFLOXACIN 500 MG PO TABS: 500.0000 mg | ORAL_TABLET | Freq: Every day | ORAL | 0 refills | Status: DC

## 2018-05-19 MED ORDER — METHYLPREDNISOLONE ACETATE 80 MG/ML IJ SUSP
80.0000 mg | Freq: Once | INTRAMUSCULAR | Status: AC
  Administered 2018-05-19: 80 mg via INTRAMUSCULAR

## 2018-05-20 NOTE — Progress Notes (Signed)
BP 132/77   Pulse (!) 111   Temp 98.9 F (37.2 C) (Oral)   Ht _0  (1.753 m)   Wt 198 lb 6.4 oz (90 kg)   SpO2 91%   BMI 29.30 kg/m    Subjective:    Patient ID: Frank Rosales, male    DOB: 1956/01/21, 63 y.o.   MRN: [id number]  HPI: Frank Rosales is a 63 y.o. male presenting on 05/19/2018 for Cough; Fever; Nasal Congestion; and Chills Patient with several days of progressing upper respiratory and bronchial symptoms. Initially there was more upper respiratory congestion. This progressed to having significant cough that is productive throughout the day and severe at night. There is occasional wheezing after coughing. Sometimes there is slight dyspnea on exertion. It is productive mucus that is yellow in color. Denies any blood.   Past Medical History:  Diagnosis Date  . Anxiety   . Arthritis    "neck; lower back; right hip; right knee" (10/18/2014)  . Asthma   . Colon polyps   . COPD (chronic obstructive pulmonary disease) ([place])   . High blood pressure   . High cholesterol   . Hilar adenopathy   . Hypothyroidism   . Multiple rib fractures 08/2016   left side   Relevant past medical, surgical, family and social history reviewed and updated as indicated. Interim medical history since our last visit reviewed. Allergies and medications reviewed and updated. DATA REVIEWED: CHART IN EPIC  Family History reviewed for pertinent findings.  Review of Systems  Constitutional: Positive for fatigue and fever. Negative for appetite change.  HENT: Positive for congestion, sinus pressure and sore throat.   Eyes: Negative.  Negative for pain and visual disturbance.  Respiratory: Positive for cough, shortness of breath and wheezing. Negative for chest tightness.   Cardiovascular: Negative.  Negative for chest pain, palpitations and leg swelling.  Gastrointestinal: Negative.  Negative for abdominal pain, diarrhea, nausea and vomiting.  Endocrine: Negative.   Genitourinary: Negative.     Musculoskeletal: Positive for back pain and myalgias.  Skin: Negative.  Negative for color change and rash.  Neurological: Positive for headaches. Negative for weakness and numbness.  Psychiatric/Behavioral: Negative.     Allergies as of 05/19/2018      Reactions   No Known Allergies       Medication List       Accurate as of May 19, 2018 11:59 PM. Always use your most recent med list.        acetaminophen 325 MG tablet Commonly known as:  TYLENOL Take 2 tablets (650 mg total) by mouth every 6 (six) hours as needed.   ADVIL PM PO Take 1 tablet at bedtime as needed by mouth.   albuterol 108 (90 Base) MCG/ACT inhaler Commonly known as:  PROAIR HFA Inhale 2 puffs into the lungs every 6 (six) hours.   albuterol (2.5 MG/3ML) 0.083% nebulizer solution Commonly known as:  PROVENTIL Take 3 mLs (2.5 mg total) by nebulization every 6 (six) hours as needed for wheezing or shortness of breath.   aspirin 81 MG tablet Take 81 mg by mouth daily.   budesonide-formoterol 160-4.5 MCG/ACT inhaler Commonly known as:  SYMBICORT TAKE 2 PUFFS BY MOUTH TWICE A DAY   cholecalciferol 1000 units tablet Commonly known as:  VITAMIN D Take 1,000 Units by mouth daily.   dextromethorphan-guaiFENesin 30-600 MG 12hr tablet Commonly known as:  MUCINEX DM Take 1 tablet by mouth 2 (two) times daily as needed for cough.  famotidine 20 MG tablet Commonly known as:  PEPCID Take 20 mg at bedtime by mouth.   folic acid 1 MG tablet Commonly known as:  FOLVITE Take 1 tablet (1 mg total) by mouth daily.   halobetasol 0.05 % ointment Commonly known as:  ULTRAVATE Apply topically 2 (two) times daily.   ibuprofen 400 MG tablet Commonly known as:  ADVIL,MOTRIN Take 1 tablet (400 mg total) by mouth every 4 (four) hours as needed.   Krill Oil 1000 MG Caps Take by mouth.   levofloxacin 500 MG tablet Commonly known as:  LEVAQUIN Take 1 tablet (500 mg total) by mouth daily.   levothyroxine 50  MCG tablet Commonly known as:  SYNTHROID, LEVOTHROID 1 TABLET EVERY MORNING BEFORE BREAKFAST-WAIT 1 HR AFTER TAKING BEFORE EATING OR DRINKING   NIFEdipine 60 MG 24 hr tablet Commonly known as:  ADALAT CC TAKE 1 TABLET BY MOUTH EVERY DAY   olmesartan 40 MG tablet Commonly known as:  BENICAR Take 1 tablet (40 mg total) by mouth daily. For blood pressure   predniSONE 10 MG tablet Commonly known as:  DELTASONE Take 2 tablets (20 mg total) by mouth daily with breakfast. After 1 week, take 1 tablet daily for one week. Then 1/2 tab daily.   vitamin B-12 250 MCG tablet Commonly known as:  CYANOCOBALAMIN Take 250 mcg by mouth daily.          Objective:    BP 132/77   Pulse (!) 111   Temp 98.9 F (37.2 C) (Oral)   Ht _0  (1.753 m)   Wt 198 lb 6.4 oz (90 kg)   SpO2 91%   BMI 29.30 kg/m   Allergies  Allergen Reactions  . No Known Allergies     Wt Readings from Last 3 Encounters:  05/19/18 198 lb 6.4 oz (90 kg)  05/02/18 207 lb 6.4 oz (94.1 kg)  12/27/17 203 lb 4 oz (92.2 kg)    Physical Exam Constitutional:      Appearance: He is well-developed.  HENT:     Head: Normocephalic and atraumatic.     Right Ear: Hearing and tympanic membrane normal.     Left Ear: Hearing and tympanic membrane normal.     Nose: Mucosal edema present. No nasal deformity.     Right Sinus: Frontal sinus tenderness present.     Left Sinus: Frontal sinus tenderness present.     Mouth/Throat:     Pharynx: Posterior oropharyngeal erythema present.  Eyes:     General:        Right eye: No discharge.        Left eye: No discharge.     Conjunctiva/sclera: Conjunctivae normal.     Pupils: Pupils are equal, round, and reactive to light.  Neck:     Musculoskeletal: Normal range of motion and neck supple.  Cardiovascular:     Rate and Rhythm: Normal rate and regular rhythm.     Heart sounds: Normal heart sounds.  Pulmonary:     Effort: Pulmonary effort is normal. No respiratory distress.      Breath sounds: Wheezing present. No decreased breath sounds, rhonchi or rales.  Abdominal:     General: Bowel sounds are normal.     Palpations: Abdomen is soft.  Musculoskeletal: Normal range of motion.  Skin:    General: Skin is warm and dry.     Results for orders placed or performed in visit on 05/02/18  Lyme Ab/Western Blot Reflex  Result Value Ref Range  Lyme IgG/IgM Ab <0.91 0.00 - 0.90 ISR   LYME DISEASE AB, QUANT, IGM <0.80 0.00 - 0.79 index  CBC with Differential/Platelet  Result Value Ref Range   WBC 10.7 3.4 - 10.8 x10E3/uL   RBC 5.56 4.14 - 5.80 x10E6/uL   Hemoglobin 18.8 (H) 13.0 - 17.7 g/dL   Hematocrit 53.9 (H) 37.5 - 51.0 %   MCV 97 79 - 97 fL   MCH 33.8 (H) 26.6 - 33.0 pg   MCHC 34.9 31.5 - 35.7 g/dL   RDW 13.2 12.3 - 15.4 %   Platelets 250 150 - 450 x10E3/uL   Neutrophils 76 Not Estab. %   Lymphs 11 Not Estab. %   Monocytes 8 Not Estab. %   Eos 3 Not Estab. %   Basos 1 Not Estab. %   Neutrophils Absolute 8.3 (H) 1.4 - 7.0 x10E3/uL   Lymphocytes Absolute 1.1 0.7 - 3.1 x10E3/uL   Monocytes Absolute 0.8 0.1 - 0.9 x10E3/uL   EOS (ABSOLUTE) 0.3 0.0 - 0.4 x10E3/uL   Basophils Absolute 0.1 0.0 - 0.2 x10E3/uL   Immature Granulocytes 1 Not Estab. %   Immature Grans (Abs) 0.1 0.0 - 0.1 x10E3/uL  CMP14+EGFR  Result Value Ref Range   Glucose 116 (H) 65 - 99 mg/dL   BUN 10 8 - 27 mg/dL   Creatinine, Ser 0.97 0.76 - 1.27 mg/dL   GFR calc non Af Amer 84 >59 mL/min/1.73   GFR calc Af Amer 98 >59 mL/min/1.73   BUN/Creatinine Ratio 10 10 - 24   Sodium 139 134 - 144 mmol/L   Potassium 4.1 3.5 - 5.2 mmol/L   Chloride 97 96 - 106 mmol/L   CO2 21 20 - 29 mmol/L   Calcium 9.5 8.6 - 10.2 mg/dL   Total Protein 7.5 6.0 - 8.5 g/dL   Albumin 4.8 3.6 - 4.8 g/dL   Globulin, Total 2.7 1.5 - 4.5 g/dL   Albumin/Globulin Ratio 1.8 1.2 - 2.2   Bilirubin Total 1.1 0.0 - 1.2 mg/dL   Alkaline Phosphatase 104 39 - 117 IU/L   AST 24 0 - 40 IU/L   ALT 32 0 - 44 IU/L       Assessment & Plan:   1. Pneumonia of both lungs due to infectious organism, unspecified part of lung - DG Chest 2 View; Future - levofloxacin (LEVAQUIN) 500 MG tablet; Take 1 tablet (500 mg total) by mouth daily.  Dispense: 10 tablet; Refill: 0 - cefTRIAXone (ROCEPHIN) injection 1 g - methylPREDNISolone acetate (DEPO-MEDROL) injection 80 mg  2. COPD GOLD III   - DG Chest 2 View; Future - albuterol (PROVENTIL) (2.5 MG/3ML) 0.083% nebulizer solution; Take 3 mLs (2.5 mg total) by nebulization every 6 (six) hours as needed for wheezing or shortness of breath.  Dispense: 240 mL; Refill: 5   Continue all other maintenance medications as listed above.  Follow up plan: Return in about 2 weeks (around 06/02/2018).  Educational handout given for [place] PA-C [facility] [address]  [place], [place] [address] [phone]   05/20/2018, 12:35 PM

## 2018-05-21 ENCOUNTER — Ambulatory Visit (HOSPITAL_COMMUNITY): Payer: Medicare Other | Admitting: Internal Medicine

## 2018-05-27 ENCOUNTER — Other Ambulatory Visit: Payer: Self-pay | Admitting: Dermatology

## 2018-05-27 DIAGNOSIS — L258 Unspecified contact dermatitis due to other agents: Secondary | ICD-10-CM | POA: Diagnosis not present

## 2018-05-27 DIAGNOSIS — L239 Allergic contact dermatitis, unspecified cause: Secondary | ICD-10-CM | POA: Diagnosis not present

## 2018-05-27 DIAGNOSIS — D485 Neoplasm of uncertain behavior of skin: Secondary | ICD-10-CM | POA: Diagnosis not present

## 2018-05-27 DIAGNOSIS — Z5181 Encounter for therapeutic drug level monitoring: Secondary | ICD-10-CM | POA: Diagnosis not present

## 2018-05-27 DIAGNOSIS — L309 Dermatitis, unspecified: Secondary | ICD-10-CM | POA: Diagnosis not present

## 2018-05-29 ENCOUNTER — Telehealth: Payer: Self-pay | Admitting: Family Medicine

## 2018-05-29 NOTE — Telephone Encounter (Signed)
Appointment given with Glenard Haring.

## 2018-06-02 ENCOUNTER — Encounter: Payer: Self-pay | Admitting: Physician Assistant

## 2018-06-02 ENCOUNTER — Ambulatory Visit (INDEPENDENT_AMBULATORY_CARE_PROVIDER_SITE_OTHER): Payer: Medicare Other

## 2018-06-02 ENCOUNTER — Ambulatory Visit (INDEPENDENT_AMBULATORY_CARE_PROVIDER_SITE_OTHER): Payer: Medicare Other | Admitting: Physician Assistant

## 2018-06-02 VITALS — BP 120/79 | HR 110 | Temp 98.0°F | Ht 69.0 in | Wt 200.2 lb

## 2018-06-02 DIAGNOSIS — R05 Cough: Secondary | ICD-10-CM | POA: Diagnosis not present

## 2018-06-02 DIAGNOSIS — W19XXXA Unspecified fall, initial encounter: Secondary | ICD-10-CM

## 2018-06-02 DIAGNOSIS — M545 Low back pain: Secondary | ICD-10-CM

## 2018-06-02 DIAGNOSIS — R054 Cough syncope: Secondary | ICD-10-CM

## 2018-06-02 DIAGNOSIS — S3992XA Unspecified injury of lower back, initial encounter: Secondary | ICD-10-CM | POA: Diagnosis not present

## 2018-06-02 DIAGNOSIS — J449 Chronic obstructive pulmonary disease, unspecified: Secondary | ICD-10-CM | POA: Diagnosis not present

## 2018-06-02 DIAGNOSIS — M549 Dorsalgia, unspecified: Secondary | ICD-10-CM | POA: Diagnosis not present

## 2018-06-02 MED ORDER — ESOMEPRAZOLE MAGNESIUM 40 MG PO CPDR: 40.0000 mg | DELAYED_RELEASE_CAPSULE | Freq: Every day | ORAL | 3 refills | Status: DC

## 2018-06-03 ENCOUNTER — Other Ambulatory Visit: Payer: Self-pay | Admitting: Physician Assistant

## 2018-06-03 DIAGNOSIS — M8580 Other specified disorders of bone density and structure, unspecified site: Secondary | ICD-10-CM

## 2018-06-03 DIAGNOSIS — S32040A Wedge compression fracture of fourth lumbar vertebra, initial encounter for closed fracture: Secondary | ICD-10-CM

## 2018-06-03 DIAGNOSIS — M5136 Other intervertebral disc degeneration, lumbar region: Secondary | ICD-10-CM

## 2018-06-03 DIAGNOSIS — W19XXXA Unspecified fall, initial encounter: Secondary | ICD-10-CM

## 2018-06-04 ENCOUNTER — Inpatient Hospital Stay (HOSPITAL_COMMUNITY): Payer: Medicare Other

## 2018-06-04 ENCOUNTER — Encounter (HOSPITAL_COMMUNITY): Payer: Self-pay | Admitting: Hematology

## 2018-06-04 ENCOUNTER — Other Ambulatory Visit: Payer: Self-pay

## 2018-06-04 ENCOUNTER — Inpatient Hospital Stay (HOSPITAL_COMMUNITY): Payer: Medicare Other | Attending: Internal Medicine | Admitting: Hematology

## 2018-06-04 VITALS — BP 132/92 | HR 121 | Temp 98.5°F | Resp 20 | Wt 197.0 lb

## 2018-06-04 DIAGNOSIS — J440 Chronic obstructive pulmonary disease with acute lower respiratory infection: Secondary | ICD-10-CM

## 2018-06-04 DIAGNOSIS — D751 Secondary polycythemia: Secondary | ICD-10-CM

## 2018-06-04 DIAGNOSIS — Z87891 Personal history of nicotine dependence: Secondary | ICD-10-CM

## 2018-06-04 DIAGNOSIS — Z7982 Long term (current) use of aspirin: Secondary | ICD-10-CM | POA: Diagnosis not present

## 2018-06-04 DIAGNOSIS — Z79899 Other long term (current) drug therapy: Secondary | ICD-10-CM | POA: Diagnosis not present

## 2018-06-04 LAB — CBC WITH DIFFERENTIAL/PLATELET
Abs Immature Granulocytes: 0.05 10*3/uL (ref 0.00–0.07)
Basophils Absolute: 0.1 10*3/uL (ref 0.0–0.1)
Basophils Relative: 1 %
Eosinophils Absolute: 0.2 10*3/uL (ref 0.0–0.5)
Eosinophils Relative: 1 %
HCT: 48 % (ref 39.0–52.0)
Hemoglobin: 16.5 g/dL (ref 13.0–17.0)
IMMATURE GRANULOCYTES: 0 %
Lymphocytes Relative: 11 %
Lymphs Abs: 1.3 10*3/uL (ref 0.7–4.0)
MCH: 34.2 pg — ABNORMAL HIGH (ref 26.0–34.0)
MCHC: 34.4 g/dL (ref 30.0–36.0)
MCV: 99.6 fL (ref 80.0–100.0)
Monocytes Absolute: 1.1 10*3/uL — ABNORMAL HIGH (ref 0.1–1.0)
Monocytes Relative: 9 %
Neutro Abs: 9 10*3/uL — ABNORMAL HIGH (ref 1.7–7.7)
Neutrophils Relative %: 78 %
Platelets: 236 10*3/uL (ref 150–400)
RBC: 4.82 MIL/uL (ref 4.22–5.81)
RDW: 14.4 % (ref 11.5–15.5)
WBC: 11.6 10*3/uL — ABNORMAL HIGH (ref 4.0–10.5)
nRBC: 0 % (ref 0.0–0.2)

## 2018-06-04 LAB — COMPREHENSIVE METABOLIC PANEL
ALK PHOS: 86 U/L (ref 38–126)
ALT: 43 U/L (ref 0–44)
ANION GAP: 12 (ref 5–15)
AST: 30 U/L (ref 15–41)
Albumin: 4.1 g/dL (ref 3.5–5.0)
BUN: 13 mg/dL (ref 6–20)
CO2: 25 mmol/L (ref 22–32)
Calcium: 9 mg/dL (ref 8.9–10.3)
Chloride: 97 mmol/L — ABNORMAL LOW (ref 98–111)
Creatinine, Ser: 0.96 mg/dL (ref 0.61–1.24)
GFR calc Af Amer: >60 mL/min (ref >60)
GFR calc non Af Amer: >60 mL/min (ref >60)
Glucose, Bld: 130 mg/dL — ABNORMAL HIGH (ref 70–99)
Potassium: 4 mmol/L (ref 3.5–5.1)
Sodium: 134 mmol/L — ABNORMAL LOW (ref 135–145)
Total Bilirubin: 1 mg/dL (ref 0.3–1.2)
Total Protein: 7.6 g/dL (ref 6.5–8.1)

## 2018-06-04 LAB — LACTATE DEHYDROGENASE: LDH: 207 U/L — ABNORMAL HIGH (ref 98–192)

## 2018-06-04 NOTE — Assessment & Plan Note (Addendum)
1.  Polycythemia: - CBC on 05/02/2018 shows hemoglobin of 18.8 and hematocrit of 54. -Review of his CBC from past year shows elevated hemoglobin in 2018. - Patient has a history of heavy smoking, quit few months ago.  He also has COPD and uses inhalers. - No vasomotor symptoms or erythromelalgia.  No aquagenic pruritus. - Family history significant for his father with a history of phlebotomies for a brief time. -He is started on prednisone for his skin rash which is being worked up. - Denies any headaches or vision changes.  Physical examination did not reveal any palpable splenomegaly. - Etiology is more likely secondary polycythemia versus dehydration. -We will repeat his CBC, check his carboxyhemoglobin, LDH.  We will also check for Jak 2 V617F mutation to rule out primary polycythemia. -We will see him back in 2 weeks for follow-up.  2.  COPD: - Because of his work exposure to different chemicals, he gets annual CT scans done by his pulmonologist in [place].

## 2018-06-04 NOTE — Progress Notes (Signed)
CONSULT NOTE  Patient Care Team: Claretta Fraise, MD as PCP - General (Family Medicine) Elsie Stain, MD as Attending Physician (Pulmonary Disease)  CHIEF COMPLAINTS/PURPOSE OF CONSULTATION: Polycythemia   HISTORY OF PRESENTING ILLNESS:  Frank Rosales 63 y.o. male is here because of polycythemia. His latest hemoglobin was 18.8. He reports he has stage 3 COPD and was a 2 pack per day smoker for over 40 years. He recently stopped a few month ago. He recently had bronchitis and was on a 10 day course of antibiotics and steroids. He also has a wood stove he uses for heat when he is working in his shop. He denies any headaches or vision changes. Denies any nausea, vomiting, or diarrhea. Denies any new pains. Had not noticed any recent bleeding such as epistaxis, hematuria or hematochezia. Denies recent chest pain on exertion, shortness of breath on minimal exertion, pre-syncopal episodes, or palpitations. Denies any numbness or tingling in hands or feet. Denies any recent fevers or recent hospitalizations. Patient reports appetite at 50% and energy level at 50%. He worked for [employer] in the [employer] for 37 years. He also had an asbestos exposure. He gets free yearly low dose CT scans from [facility] [facility]. He lives at home with his wife and needs help with a few daily activities and ADLs. He needs help with bathing and gets SOB with exertion. He is having issues with falling lately since he had bronchitis. He reports he gets to coughing so hard he has blackouts and falls.  He has a family history of cancer. His father had lung cancer. His paternal uncle had esogheal cancer. His paternal aunt had breast cancer. He denies any blood disorders in his family. He does remember his father having to get blood removed on occasions when he was younger.     MEDICAL HISTORY:  Past Medical History:  Diagnosis Date  . Anxiety   . Arthritis    "neck; lower back; right hip; right knee"  (10/18/2014)  . Asthma   . Colon polyps   . COPD (chronic obstructive pulmonary disease) ([facility])   . High blood pressure   . High cholesterol   . Hilar adenopathy   . Hypothyroidism   . Multiple rib fractures 08/2016   left side    SURGICAL HISTORY: Past Surgical History:  Procedure Laterality Date  . BACK SURGERY    . epidural injections    . FIXATION KYPHOPLASTY LUMBAR SPINE     "L1"  . KNEE ARTHROSCOPY Right 05/14/2010  . PLEURAL EFFUSION DRAINAGE Left 08/22/2016   Procedure: DRAINAGE OF HEMOTHORAX;  Surgeon: [name] Nakayama, MD;  Location: [facility];  Service: Thoracic;  Laterality: Left;  . RIB PLATING Left 08/22/2016   Procedure: RIB PLATING;  Surgeon: [name] Nakayama, MD;  Location: [facility];  Service: Thoracic;  Laterality: Left;  Marland Kitchen VIDEO ASSISTED THORACOSCOPY Left 08/22/2016   Procedure: VIDEO ASSISTED THORACOSCOPY;  Surgeon: [name] Nakayama, MD;  Location: [facility];  Service: Thoracic;  Laterality: Left;  . WISDOM TOOTH EXTRACTION      SOCIAL HISTORY: Social History   Socioeconomic History  . Marital status: Married    Spouse name: Not on file  . Number of children: 1  . Years of education: Not on file  . Highest education level: Not on file  Occupational History  . Occupation: Company secretary: [employer]  Social Needs  . Financial resource strain: Not on file  . Food  insecurity:    Worry: Not on file    Inability: Not on file  . Transportation needs:    Medical: Not on file    Non-medical: Not on file  Tobacco Use  . Smoking status: Former Smoker    Packs/day: 2.00    Years: 45.00    Pack years: 90.00    Types: Cigarettes    Last attempt to quit: 11/11/2017    Years since quitting: 0.5  . Smokeless tobacco: Never Used  . Tobacco comment:     Substance and Sexual Activity  . Alcohol use: Yes    Alcohol/week: 42.0 standard drinks    Types: 42 Cans of beer per week    Comment: 6 beer a day   . Drug use: No  . Sexual activity: Not Currently   Lifestyle  . Physical activity:    Days per week: Not on file    Minutes per session: Not on file  . Stress: Not on file  Relationships  . Social connections:    Talks on phone: Not on file    Gets together: Not on file    Attends religious service: Not on file    Active member of club or organization: Not on file    Attends meetings of clubs or organizations: Not on file    Relationship status: Not on file  . Intimate partner violence:    Fear of current or ex partner: Not on file    Emotionally abused: Not on file    Physically abused: Not on file    Forced sexual activity: Not on file  Other Topics Concern  . Not on file  Social History Narrative  . Not on file    FAMILY HISTORY: Family History  Problem Relation Age of Onset  . Emphysema Father   . Lung cancer Father   . Heart disease Brother   . Heart failure Mother     ALLERGIES:  is allergic to no known allergies.  MEDICATIONS:  Current Outpatient Medications  Medication Sig Dispense Refill  . acetaminophen (TYLENOL) 325 MG tablet Take 2 tablets (650 mg total) by mouth every 6 (six) hours as needed.    Marland Kitchen albuterol (PROAIR HFA) 108 (90 Base) MCG/ACT inhaler Inhale 2 puffs into the lungs every 6 (six) hours. 1 Inhaler 2  . albuterol (PROVENTIL) (2.5 MG/3ML) 0.083% nebulizer solution Take 3 mLs (2.5 mg total) by nebulization every 6 (six) hours as needed for wheezing or shortness of breath. 240 mL 5  . aspirin 81 MG tablet Take 81 mg by mouth daily.     . budesonide-formoterol (SYMBICORT) 160-4.5 MCG/ACT inhaler TAKE 2 PUFFS BY MOUTH TWICE A DAY 30.6 Inhaler 1  . cholecalciferol (VITAMIN D) 1000 UNITS tablet Take 1,000 Units by mouth daily.     Marland Kitchen dextromethorphan-guaiFENesin (MUCINEX DM) 30-600 MG 12hr tablet Take 1 tablet by mouth 2 (two) times daily as needed for cough.    . esomeprazole (NEXIUM) 40 MG capsule Take 1 capsule (40 mg total) by mouth daily. 30 capsule 3  . folic acid (FOLVITE) 1 MG tablet Take 1  tablet (1 mg total) by mouth daily. 90 tablet 3  . ibuprofen (ADVIL,MOTRIN) 400 MG tablet Take 1 tablet (400 mg total) by mouth every 4 (four) hours as needed. 30 tablet 0  . Ibuprofen-Diphenhydramine Cit (ADVIL PM PO) Take 1 tablet at bedtime as needed by mouth.    Javier Docker Oil 1000 MG CAPS Take by mouth.    . levothyroxine (  SYNTHROID, LEVOTHROID) 50 MCG tablet 1 TABLET EVERY MORNING BEFORE BREAKFAST-WAIT 1 HR AFTER TAKING BEFORE EATING OR DRINKING 90 tablet 1  . NIFEdipine (PROCARDIA-XL/ADALAT CC) 60 MG 24 hr tablet TAKE 1 TABLET BY MOUTH EVERY DAY 90 tablet 1  . olmesartan (BENICAR) 40 MG tablet Take 1 tablet (40 mg total) by mouth daily. For blood pressure 90 tablet 1  . predniSONE (DELTASONE) 10 MG tablet Take 2 tablets (20 mg total) by mouth daily with breakfast. After 1 week, take 1 tablet daily for one week. Then 1/2 tab daily. 40 tablet 1  . triamcinolone cream (KENALOG) 0.1 %     . vitamin B-12 (CYANOCOBALAMIN) 250 MCG tablet Take 250 mcg by mouth daily.     No current facility-administered medications for this visit.     REVIEW OF SYSTEMS:   Constitutional: Denies fevers, chills or abnormal night sweats Eyes: Denies blurriness of vision, double vision or watery eyes Ears, nose, mouth, throat, and face: Denies mucositis or sore throat Respiratory: + cough, dyspnea or wheezes Cardiovascular: Denies palpitation, chest discomfort or lower extremity swelling Gastrointestinal:  Denies nausea, heartburn or change in bowel habits Skin: +abnormal skin rash redness  Lymphatics: Denies new lymphadenopathy or easy bruising Neurological:Denies numbness, tingling or new weaknesses Behavioral/Psych: Mood is stable, no new changes  All other systems were reviewed with the patient and are negative.  PHYSICAL EXAMINATION: ECOG PERFORMANCE STATUS: 1 - Symptomatic but completely ambulatory  Vitals:   06/04/18 1313  BP: (!) 132/92  Pulse: (!) 121  Resp: 20  Temp: 98.5 F (36.9 C)  SpO2: 93%    Filed Weights   06/04/18 1313  Weight: 197 lb (89.4 kg)    GENERAL:alert, no distress and comfortable SKIN: +redness of skin rash EYES: normal, conjunctiva are pink and non-injected, sclera clear OROPHARYNX:no exudate, no erythema and lips, buccal mucosa, and tongue normal  NECK: supple, thyroid normal size, non-tender, without nodularity LYMPH:  no palpable lymphadenopathy in the cervical, axillary or inguinal LUNGS: + wheezing and rhonchi  HEART: regular rate & rhythm and no murmurs and no lower extremity edema ABDOMEN:abdomen soft, non-tender and normal bowel sounds Musculoskeletal:no cyanosis of digits and no clubbing  PSYCH: alert & oriented x 3 with fluent speech NEURO: no focal motor/sensory deficits  LABORATORY DATA:  I have reviewed the data as listed Recent Results (from the past 2160 hour(s))  Lyme Ab/Western Blot Reflex     Status: None   Collection Time: 05/02/18 12:12 PM  Result Value Ref Range   Lyme IgG/IgM Ab <0.91 0.00 - 0.90 ISR    Comment:                                 Negative         <0.91                                 Equivocal  0.91 - 1.09                                 Positive         >1.09    LYME DISEASE AB, QUANT, IGM <0.80 0.00 - 0.79 index    Comment:  Negative         <0.80                                 Equivocal  0.80 - 1.19                                 Positive         >1.19  IgM levels may peak at 3-6 weeks post infection, then  gradually decline.   CBC with Differential/Platelet     Status: Abnormal   Collection Time: 05/02/18 12:12 PM  Result Value Ref Range   WBC 10.7 3.4 - 10.8 x10E3/uL   RBC 5.56 4.14 - 5.80 x10E6/uL   Hemoglobin 18.8 (H) 13.0 - 17.7 g/dL   Hematocrit 53.9 (H) 37.5 - 51.0 %   MCV 97 79 - 97 fL   MCH 33.8 (H) 26.6 - 33.0 pg   MCHC 34.9 31.5 - 35.7 g/dL   RDW 13.2 12.3 - 15.4 %    Comment: **Effective May 19, 2018, the RDW pediatric reference**   interval will be  removed and the adult reference interval   will be changing to:                             Male 11.7 - 15.4                                                      Male 11.6 - 15.4    Platelets 250 150 - 450 x10E3/uL   Neutrophils 76 Not Estab. %   Lymphs 11 Not Estab. %   Monocytes 8 Not Estab. %   Eos 3 Not Estab. %   Basos 1 Not Estab. %   Neutrophils Absolute 8.3 (H) 1.4 - 7.0 x10E3/uL   Lymphocytes Absolute 1.1 0.7 - 3.1 x10E3/uL   Monocytes Absolute 0.8 0.1 - 0.9 x10E3/uL   EOS (ABSOLUTE) 0.3 0.0 - 0.4 x10E3/uL   Basophils Absolute 0.1 0.0 - 0.2 x10E3/uL   Immature Granulocytes 1 Not Estab. %   Immature Grans (Abs) 0.1 0.0 - 0.1 x10E3/uL  CMP14+EGFR     Status: Abnormal   Collection Time: 05/02/18 12:12 PM  Result Value Ref Range   Glucose 116 (H) 65 - 99 mg/dL   BUN 10 8 - 27 mg/dL   Creatinine, Ser 0.97 0.76 - 1.27 mg/dL   GFR calc non Af Amer 84 >59 mL/min/1.73   GFR calc Af Amer 98 >59 mL/min/1.73   BUN/Creatinine Ratio 10 10 - 24   Sodium 139 134 - 144 mmol/L   Potassium 4.1 3.5 - 5.2 mmol/L   Chloride 97 96 - 106 mmol/L   CO2 21 20 - 29 mmol/L   Calcium 9.5 8.6 - 10.2 mg/dL   Total Protein 7.5 6.0 - 8.5 g/dL   Albumin 4.8 3.6 - 4.8 g/dL   Globulin, Total 2.7 1.5 - 4.5 g/dL   Albumin/Globulin Ratio 1.8 1.2 - 2.2   Bilirubin Total 1.1 0.0 - 1.2 mg/dL   Alkaline Phosphatase 104 39 - 117 IU/L   AST 24 0 - 40 IU/L   ALT 32 0 - 44  IU/L  Comprehensive metabolic panel     Status: Abnormal   Collection Time: 06/04/18  2:19 PM  Result Value Ref Range   Sodium 134 (L) 135 - 145 mmol/L   Potassium 4.0 3.5 - 5.1 mmol/L   Chloride 97 (L) 98 - 111 mmol/L   CO2 25 22 - 32 mmol/L   Glucose, Bld 130 (H) 70 - 99 mg/dL   BUN 13 6 - 20 mg/dL   Creatinine, Ser 0.96 0.61 - 1.24 mg/dL   Calcium 9.0 8.9 - 10.3 mg/dL   Total Protein 7.6 6.5 - 8.1 g/dL   Albumin 4.1 3.5 - 5.0 g/dL   AST 30 15 - 41 U/L   ALT 43 0 - 44 U/L   Alkaline Phosphatase 86 38 - 126 U/L   Total Bilirubin  1.0 0.3 - 1.2 mg/dL   GFR calc non Af Amer >60 >60 mL/min   GFR calc Af Amer >60 >60 mL/min   Anion gap 12 5 - 15    Comment: Performed at [facility], [address] [place], [place] [address]  CBC with Differential/Platelet     Status: Abnormal   Collection Time: 06/04/18  2:19 PM  Result Value Ref Range   WBC 11.6 (H) 4.0 - 10.5 K/uL   RBC 4.82 4.22 - 5.81 MIL/uL   Hemoglobin 16.5 13.0 - 17.0 g/dL   HCT 48.0 39.0 - 52.0 %   MCV 99.6 80.0 - 100.0 fL   MCH 34.2 (H) 26.0 - 34.0 pg   MCHC 34.4 30.0 - 36.0 g/dL   RDW 14.4 11.5 - 15.5 %   Platelets 236 150 - 400 K/uL   nRBC 0.0 0.0 - 0.2 %   Neutrophils Relative % 78 %   Neutro Abs 9.0 (H) 1.7 - 7.7 K/uL   Lymphocytes Relative 11 %   Lymphs Abs 1.3 0.7 - 4.0 K/uL   Monocytes Relative 9 %   Monocytes Absolute 1.1 (H) 0.1 - 1.0 K/uL   Eosinophils Relative 1 %   Eosinophils Absolute 0.2 0.0 - 0.5 K/uL   Basophils Relative 1 %   Basophils Absolute 0.1 0.0 - 0.1 K/uL   Immature Granulocytes 0 %   Abs Immature Granulocytes 0.05 0.00 - 0.07 K/uL    Comment: Performed at [facility], [address] [place], [place] [address]  Lactate dehydrogenase     Status: Abnormal   Collection Time: 06/04/18  2:19 PM  Result Value Ref Range   LDH 207 (H) 98 - 192 U/L    Comment: Performed at [facility], [address], [place], [place] [address]    RADIOGRAPHIC STUDIES: I have personally reviewed the radiological images as listed and agreed with the findings in the report. Dg Chest 2 View  Result Date: 05/19/2018 CLINICAL DATA:  Pneumonia, congestion EXAM: CHEST - 2 VIEW COMPARISON:  11/25/2017, 08/20/2016 FINDINGS: There is left basilar scarring. There is no focal consolidation. There is no pleural effusion or pneumothorax. The heart and mediastinal contours are unremarkable. Prior internal fixation of 2 consecutive left lateral ribs. Multiple old right rib fractures. IMPRESSION: No active cardiopulmonary disease. Electronically Signed   By:  Kathreen Devoid   On: 05/19/2018 11:58   Dg Lumbar Spine 2-3 Views  Result Date: 06/02/2018 CLINICAL DATA:  [place].  Back pain. EXAM: LUMBAR SPINE - 2-3 VIEW COMPARISON:  Radiograph 01/28/2018 FINDINGS: Stable vertebral augmentation changes noted at L1 along with a stable compression deformity of T12. Suspect new or progressive compression fracture of  L4. CT may be helpful for further evaluation. The overall alignment is maintained. Stable advanced lower lumbar facet disease. The visualized bony pelvis is intact. IMPRESSION: 1. Suspect new or progressive compression fracture of L4. CT may be helpful for further evaluation. 2. Stable vertebral augmentation changes at L1 along with a stable compression deformity of T12. Electronically Signed   By: Marijo Sanes M.D.   On: 06/02/2018 10:49    ASSESSMENT & PLAN:  Polycythemia, secondary 1.  Polycythemia: - CBC on 05/02/2018 shows hemoglobin of 18.8 and hematocrit of 54. -Review of his CBC from past year shows elevated hemoglobin in 2018. - Patient has a history of heavy smoking, quit few months ago.  He also has COPD and uses inhalers. - No vasomotor symptoms or erythromelalgia.  No aquagenic pruritus. - Family history significant for his father with a history of phlebotomies for a brief time. -He is started on prednisone for his skin rash which is being worked up. - Denies any headaches or vision changes.  Physical examination did not reveal any palpable splenomegaly. - Etiology is more likely secondary polycythemia versus dehydration. -We will repeat his CBC, check his carboxyhemoglobin, LDH.  We will also check for Jak 2 V617F mutation to rule out primary polycythemia. -We will see him back in 2 weeks for follow-up.  2.  COPD: - Because of his work exposure to different chemicals, he gets annual CT scans done by his pulmonologist in [place].     All questions were answered. The patient knows to call the clinic with any problems, questions or  concerns.     Derek Jack, MD 06/04/18 3:30 PM

## 2018-06-04 NOTE — Patient Instructions (Addendum)
[facility] at [facility] Discharge Instructions  Follow up in  2-3 weeks     Thank you for choosing [facility] at [facility] to provide your oncology and hematology care.  To afford each patient quality time with our provider, please arrive at least 15 minutes before your scheduled appointment time.   If you have a lab appointment with the [facility] please come in thru the  Main Entrance and check in at the main information desk  You need to re-schedule your appointment should you arrive 10 or more minutes late.  We strive to give you quality time with our providers, and arriving late affects you and other patients whose appointments are after yours.  Also, if you no show three or more times for appointments you may be dismissed from the clinic at the providers discretion.     Again, thank you for choosing [facility].  Our hope is that these requests will decrease the amount of time that you wait before being seen by our physicians.       _____________________________________________________________  Should you have questions after your visit to [facility], please contact our office at [phone] between the hours of 8:00 a.m. and 4:30 p.m.  Voicemails left after 4:00 p.m. will not be returned until the following business day.  For prescription refill requests, have your pharmacy contact our office and allow 72 hours.    Cancer Center Support Programs:   > Cancer Support Group  2nd Tuesday of the month 1pm-2pm, Journey Room

## 2018-06-05 NOTE — Progress Notes (Signed)
BP 120/79   Pulse (!) 110   Temp 98 F (36.7 C) (Oral)   Ht 5' 9"  (1.753 m)   Wt 200 lb 3.2 oz (90.8 kg)   SpO2 94%   BMI 29.56 kg/m    Subjective:    Patient ID: Frank Rosales, male    DOB: [birth year], 63 y.o.   MRN: [id number]  HPI: Frank Rosales is a 63 y.o. male presenting on 06/02/2018 for Pneumonia (2 week rck ) and Loss of Consciousness (has happened twice when coughing so much ) Patient comes in for recheck on his pneumonia.  He states that he is feeling mostly better.  But he does have episodes of cough where he will have syncope.  In the past 2 weeks he has fallen and hurt his back.  He has known degenerative disc disease in the past and is actually had surgeries in the past.  He has been seen by Dr. Rita Ohara in the past.   Past Medical History:  Diagnosis Date  . Anxiety   . Arthritis    "neck; lower back; right hip; right knee" (10/18/2014)  . Asthma   . Colon polyps   . COPD (chronic obstructive pulmonary disease) ([place])   . High blood pressure   . High cholesterol   . Hilar adenopathy   . Hypothyroidism   . Multiple rib fractures 08/2016   left side   Relevant past medical, surgical, family and social history reviewed and updated as indicated. Interim medical history since our last visit reviewed. Allergies and medications reviewed and updated. DATA REVIEWED: CHART IN EPIC  Family History reviewed for pertinent findings.  Review of Systems  Constitutional: Negative.  Negative for appetite change and fatigue.  HENT: Negative.   Eyes: Negative.  Negative for pain and visual disturbance.  Respiratory: Positive for cough and shortness of breath. Negative for chest tightness and wheezing.   Cardiovascular: Negative.  Negative for chest pain, palpitations and leg swelling.  Gastrointestinal: Negative.  Negative for abdominal pain, diarrhea, nausea and vomiting.  Endocrine: Negative.   Genitourinary: Negative.   Musculoskeletal: Positive for arthralgias, back  pain and myalgias.  Skin: Negative.  Negative for color change and rash.  Neurological: Negative.  Negative for weakness, numbness and headaches.  Psychiatric/Behavioral: Negative.     Allergies as of 06/02/2018      Reactions   No Known Allergies       Medication List       Accurate as of June 02, 2018 11:59 PM. Always use your most recent med list.        acetaminophen 325 MG tablet Commonly known as:  TYLENOL Take 2 tablets (650 mg total) by mouth every 6 (six) hours as needed.   ADVIL PM PO Take 1 tablet at bedtime as needed by mouth.   albuterol 108 (90 Base) MCG/ACT inhaler Commonly known as:  PROAIR HFA Inhale 2 puffs into the lungs every 6 (six) hours.   albuterol (2.5 MG/3ML) 0.083% nebulizer solution Commonly known as:  PROVENTIL Take 3 mLs (2.5 mg total) by nebulization every 6 (six) hours as needed for wheezing or shortness of breath.   aspirin 81 MG tablet Take 81 mg by mouth daily.   budesonide-formoterol 160-4.5 MCG/ACT inhaler Commonly known as:  SYMBICORT TAKE 2 PUFFS BY MOUTH TWICE A DAY   cholecalciferol 1000 units tablet Commonly known as:  VITAMIN D Take 1,000 Units by mouth daily.   dextromethorphan-guaiFENesin 30-600 MG 12hr tablet Commonly known as:  MUCINEX DM Take 1 tablet by mouth 2 (two) times daily as needed for cough.   esomeprazole 40 MG capsule Commonly known as:  NEXIUM Take 1 capsule (40 mg total) by mouth daily.   famotidine 20 MG tablet Commonly known as:  PEPCID Take 20 mg at bedtime by mouth.   folic acid 1 MG tablet Commonly known as:  FOLVITE Take 1 tablet (1 mg total) by mouth daily.   halobetasol 0.05 % ointment Commonly known as:  ULTRAVATE Apply topically 2 (two) times daily.   ibuprofen 400 MG tablet Commonly known as:  ADVIL,MOTRIN Take 1 tablet (400 mg total) by mouth every 4 (four) hours as needed.   Krill Oil 1000 MG Caps Take by mouth.   levothyroxine 50 MCG tablet Commonly known as:   SYNTHROID, LEVOTHROID 1 TABLET EVERY MORNING BEFORE BREAKFAST-WAIT 1 HR AFTER TAKING BEFORE EATING OR DRINKING   NIFEdipine 60 MG 24 hr tablet Commonly known as:  ADALAT CC TAKE 1 TABLET BY MOUTH EVERY DAY   olmesartan 40 MG tablet Commonly known as:  BENICAR Take 1 tablet (40 mg total) by mouth daily. For blood pressure   predniSONE 10 MG tablet Commonly known as:  DELTASONE Take 2 tablets (20 mg total) by mouth daily with breakfast. After 1 week, take 1 tablet daily for one week. Then 1/2 tab daily.   triamcinolone cream 0.1 % Commonly known as:  KENALOG   vitamin B-12 250 MCG tablet Commonly known as:  CYANOCOBALAMIN Take 250 mcg by mouth daily.          Objective:    BP 120/79   Pulse (!) 110   Temp 98 F (36.7 C) (Oral)   Ht 5' 9"  (1.753 m)   Wt 200 lb 3.2 oz (90.8 kg)   SpO2 94%   BMI 29.56 kg/m   Allergies  Allergen Reactions  . No Known Allergies     Wt Readings from Last 3 Encounters:  06/04/18 197 lb (89.4 kg)  06/02/18 200 lb 3.2 oz (90.8 kg)  05/19/18 198 lb 6.4 oz (90 kg)    Physical Exam Vitals signs and nursing note reviewed.  Constitutional:      General: He is not in acute distress.    Appearance: He is well-developed.  HENT:     Head: Normocephalic and atraumatic.  Eyes:     Conjunctiva/sclera: Conjunctivae normal.     Pupils: Pupils are equal, round, and reactive to light.  Cardiovascular:     Rate and Rhythm: Normal rate and regular rhythm.     Heart sounds: Normal heart sounds.  Pulmonary:     Effort: Pulmonary effort is normal. No respiratory distress.     Breath sounds: Normal breath sounds.  Musculoskeletal:     Lumbar back: He exhibits decreased range of motion, swelling, pain and spasm.  Skin:    General: Skin is warm and dry.  Psychiatric:        Behavior: Behavior normal.     Results for orders placed or performed in visit on 05/02/18  Lyme Ab/Western Blot Reflex  Result Value Ref Range   Lyme IgG/IgM Ab <0.91 0.00  - 0.90 ISR   LYME DISEASE AB, QUANT, IGM <0.80 0.00 - 0.79 index  CBC with Differential/Platelet  Result Value Ref Range   WBC 10.7 3.4 - 10.8 x10E3/uL   RBC 5.56 4.14 - 5.80 x10E6/uL   Hemoglobin 18.8 (H) 13.0 - 17.7 g/dL   Hematocrit 53.9 (H) 37.5 - 51.0 %  MCV 97 79 - 97 fL   MCH 33.8 (H) 26.6 - 33.0 pg   MCHC 34.9 31.5 - 35.7 g/dL   RDW 13.2 12.3 - 15.4 %   Platelets 250 150 - 450 x10E3/uL   Neutrophils 76 Not Estab. %   Lymphs 11 Not Estab. %   Monocytes 8 Not Estab. %   Eos 3 Not Estab. %   Basos 1 Not Estab. %   Neutrophils Absolute 8.3 (H) 1.4 - 7.0 x10E3/uL   Lymphocytes Absolute 1.1 0.7 - 3.1 x10E3/uL   Monocytes Absolute 0.8 0.1 - 0.9 x10E3/uL   EOS (ABSOLUTE) 0.3 0.0 - 0.4 x10E3/uL   Basophils Absolute 0.1 0.0 - 0.2 x10E3/uL   Immature Granulocytes 1 Not Estab. %   Immature Grans (Abs) 0.1 0.0 - 0.1 x10E3/uL  CMP14+EGFR  Result Value Ref Range   Glucose 116 (H) 65 - 99 mg/dL   BUN 10 8 - 27 mg/dL   Creatinine, Ser 0.97 0.76 - 1.27 mg/dL   GFR calc non Af Amer 84 >59 mL/min/1.73   GFR calc Af Amer 98 >59 mL/min/1.73   BUN/Creatinine Ratio 10 10 - 24   Sodium 139 134 - 144 mmol/L   Potassium 4.1 3.5 - 5.2 mmol/L   Chloride 97 96 - 106 mmol/L   CO2 21 20 - 29 mmol/L   Calcium 9.5 8.6 - 10.2 mg/dL   Total Protein 7.5 6.0 - 8.5 g/dL   Albumin 4.8 3.6 - 4.8 g/dL   Globulin, Total 2.7 1.5 - 4.5 g/dL   Albumin/Globulin Ratio 1.8 1.2 - 2.2   Bilirubin Total 1.1 0.0 - 1.2 mg/dL   Alkaline Phosphatase 104 39 - 117 IU/L   AST 24 0 - 40 IU/L   ALT 32 0 - 44 IU/L      Assessment & Plan:   1. Fall, initial encounter - DG Lumbar Spine 2-3 Views; Future - Ambulatory referral to Pulmonology  2. COPD GOLD III  - Ambulatory referral to Pulmonology  3. Syncope, tussive - Ambulatory referral to Pulmonology   Continue all other maintenance medications as listed above.  Follow up plan: No follow-ups on file.  Educational handout given for [place] PA-C [facility] [address]  [place], [place] [address] [phone]   06/05/2018, 2:16 PM

## 2018-06-09 ENCOUNTER — Ambulatory Visit (HOSPITAL_COMMUNITY)
Admission: RE | Admit: 2018-06-09 | Discharge: 2018-06-09 | Disposition: A | Discharge status: Home / Self Care | Payer: Medicare Other | Source: Ambulatory Visit | Attending: Physician Assistant | Admitting: Physician Assistant

## 2018-06-09 DIAGNOSIS — M5136 Other intervertebral disc degeneration, lumbar region: Secondary | ICD-10-CM

## 2018-06-09 DIAGNOSIS — S32040A Wedge compression fracture of fourth lumbar vertebra, initial encounter for closed fracture: Secondary | ICD-10-CM | POA: Diagnosis not present

## 2018-06-09 DIAGNOSIS — W19XXXA Unspecified fall, initial encounter: Secondary | ICD-10-CM | POA: Insufficient documentation

## 2018-06-09 DIAGNOSIS — M5126 Other intervertebral disc displacement, lumbar region: Secondary | ICD-10-CM | POA: Insufficient documentation

## 2018-06-09 DIAGNOSIS — M48061 Spinal stenosis, lumbar region without neurogenic claudication: Secondary | ICD-10-CM | POA: Diagnosis not present

## 2018-06-09 DIAGNOSIS — M545 Low back pain: Secondary | ICD-10-CM | POA: Diagnosis not present

## 2018-06-09 DIAGNOSIS — M8580 Other specified disorders of bone density and structure, unspecified site: Secondary | ICD-10-CM | POA: Diagnosis not present

## 2018-06-09 LAB — JAK2 GENOTYPR

## 2018-06-09 IMAGING — CR DG CHEST 2V
2 series · 2 of 2 positions shown · non-contrast
Comparison: Chest x-ray 08/05/2016.

CLINICAL DATA: 60-year-old male with history of trauma from a fall.
Left-sided rib pain.

EXAM:
CHEST  2 VIEW

[chest pa]
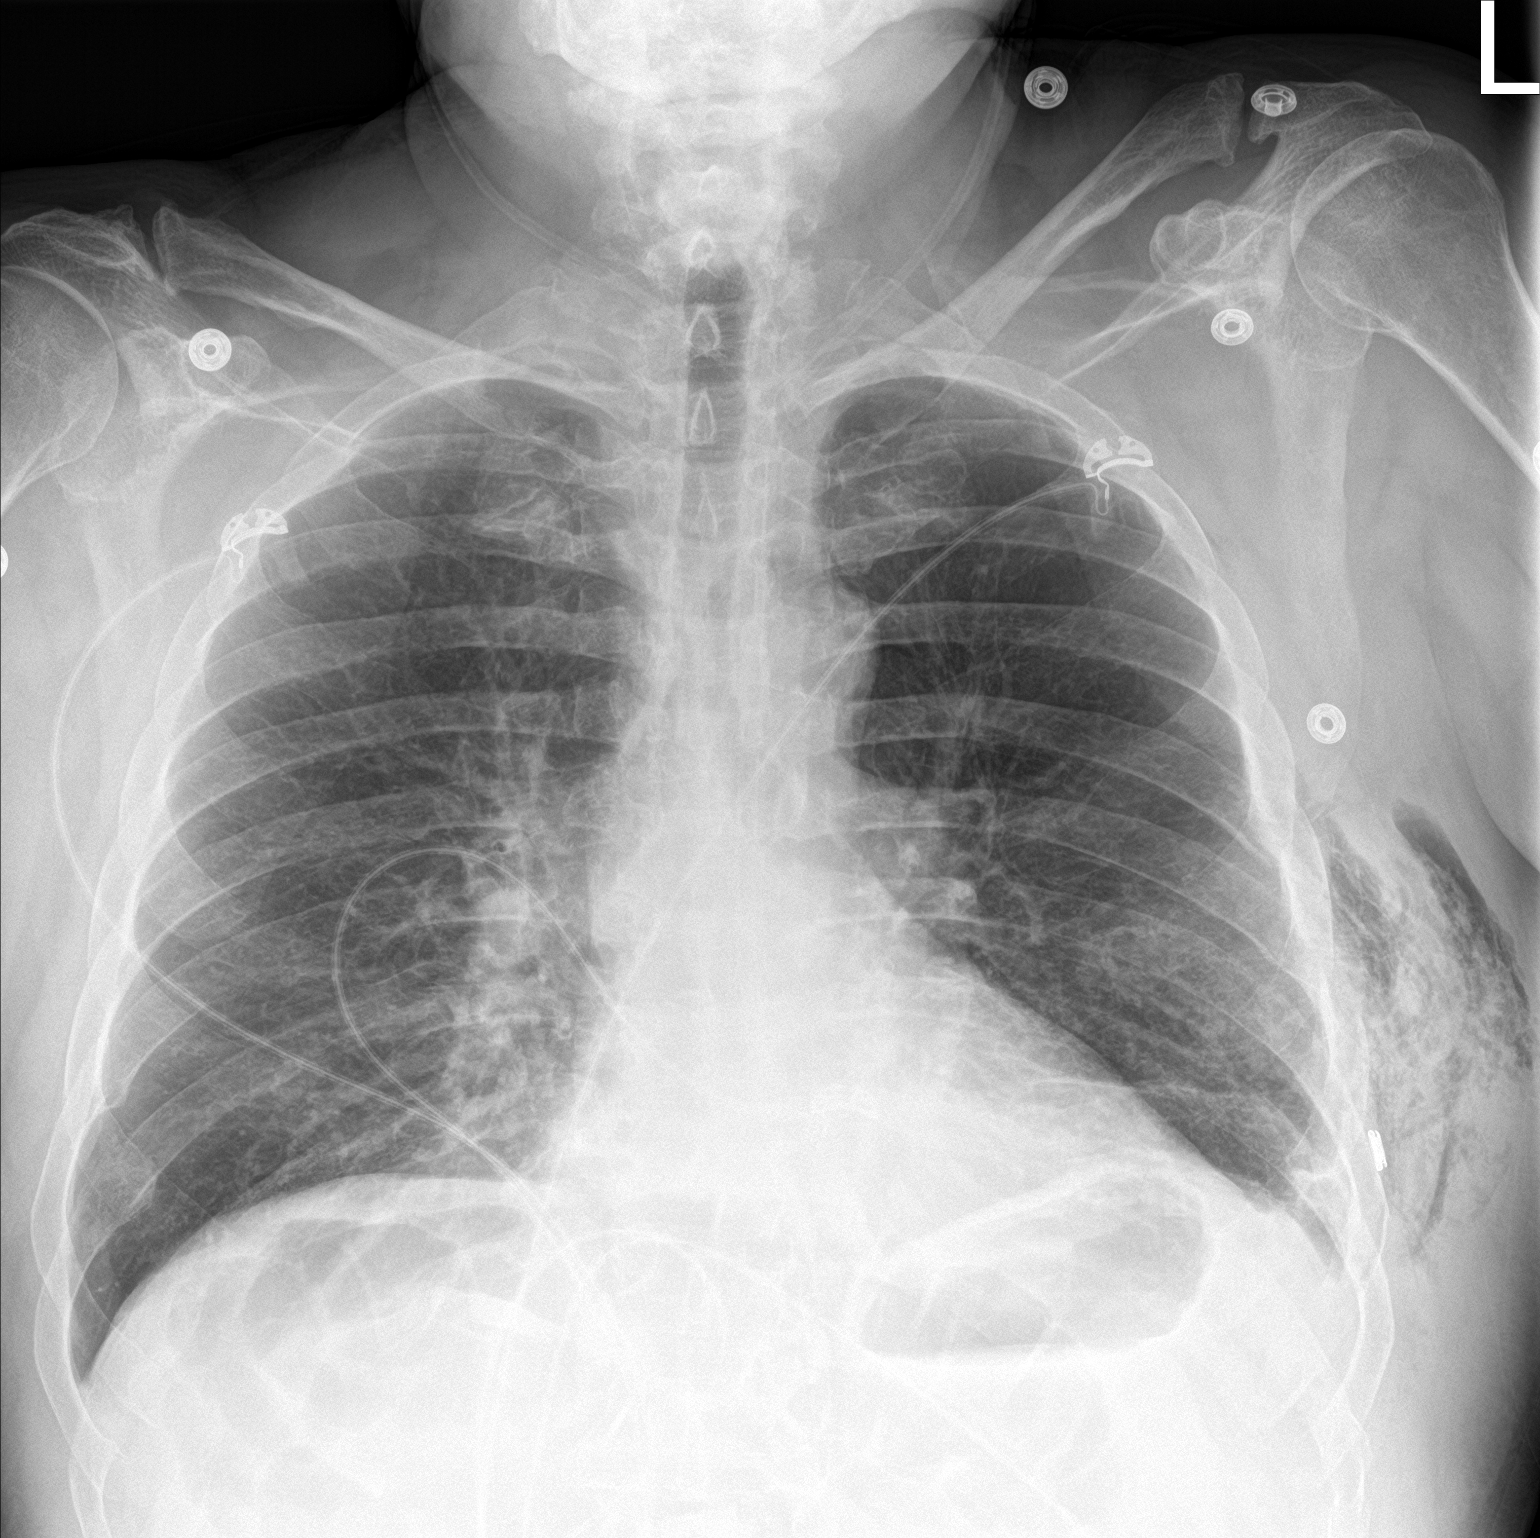

[chest lat]
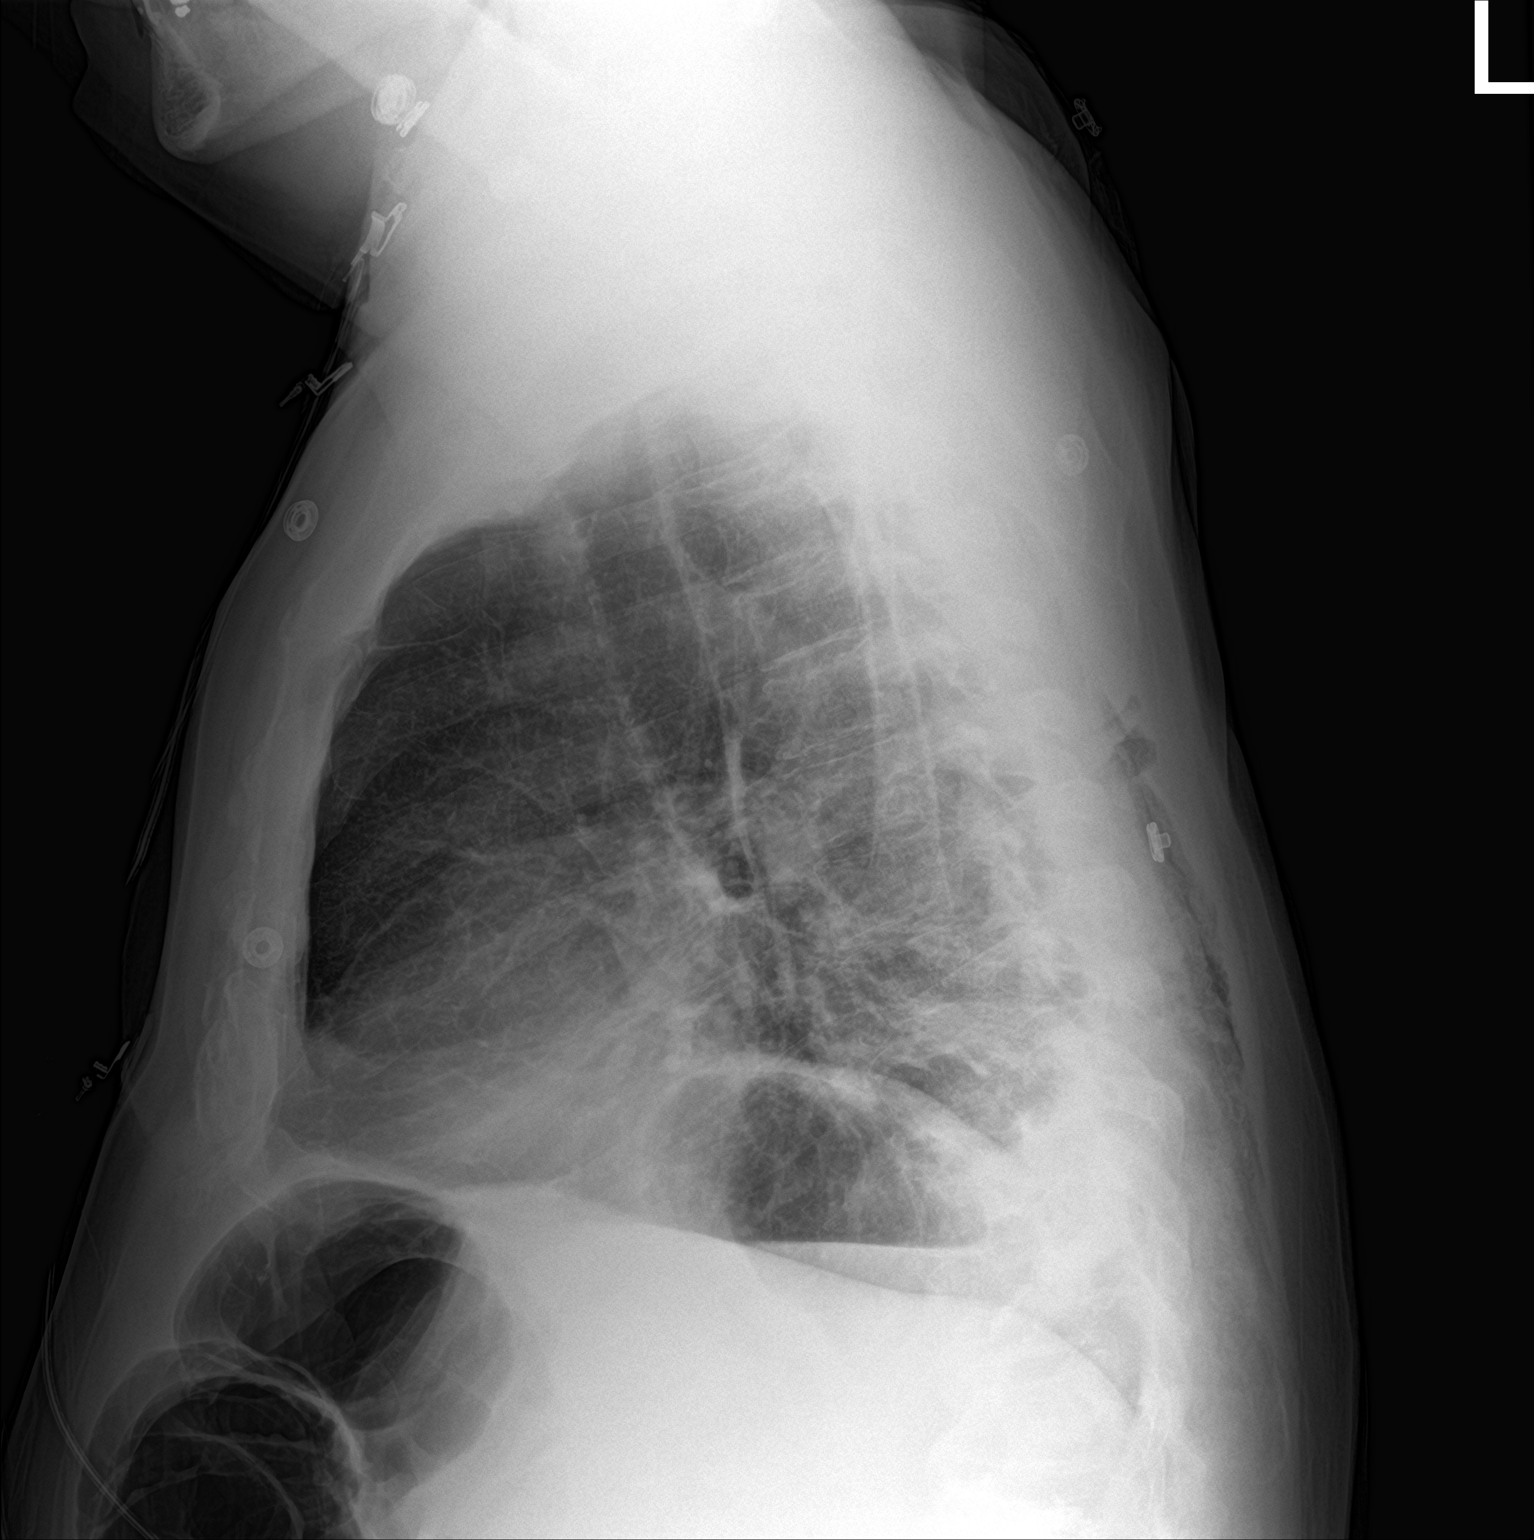

[2 of 2 positions shown; findings below may reference images not displayed]

FINDINGS: Acute nondisplaced and minimally displaced fractures of the left
fifth through eighth ribs are again noted. There is increasing
overlying subcutaneous emphysema in the lateral aspect of the left
chest wall. No appreciable pneumothorax. Small left pleural fluid
collection (presumably a hemothorax). Some linear opacities in the
base of the left lung, presumably areas of subsegmental atelectasis.
No definite acute consolidative airspace disease. No right pleural
effusion. No evidence of pulmonary edema. Heart size is normal.
Mediastinal contours are within normal limits. Multiple old healed
right-sided rib fractures are also incidentally noted.
IMPRESSION: 1. Multiple fractures of the left fifth through eighth ribs 3
demonstrated, with increasing overlying subcutaneous emphysema in
the left chest wall. There is no appreciable pneumothorax at this
time. Small left pleural fluid collection (presumably a hemothorax)
is noted, as well as some passive subsegmental atelectasis
throughout the left lung base.

## 2018-06-10 ENCOUNTER — Telehealth: Payer: Self-pay

## 2018-06-10 IMAGING — CR DG CHEST 2V
2 series · 3 of 3 positions shown · non-contrast
Comparison: Chest x-ray 08/07/2015.

CLINICAL DATA: 60-year-old male with history of multiple rib
fractures. Shortness of breath and congestion.

EXAM:
CHEST  2 VIEW

[chest lat]
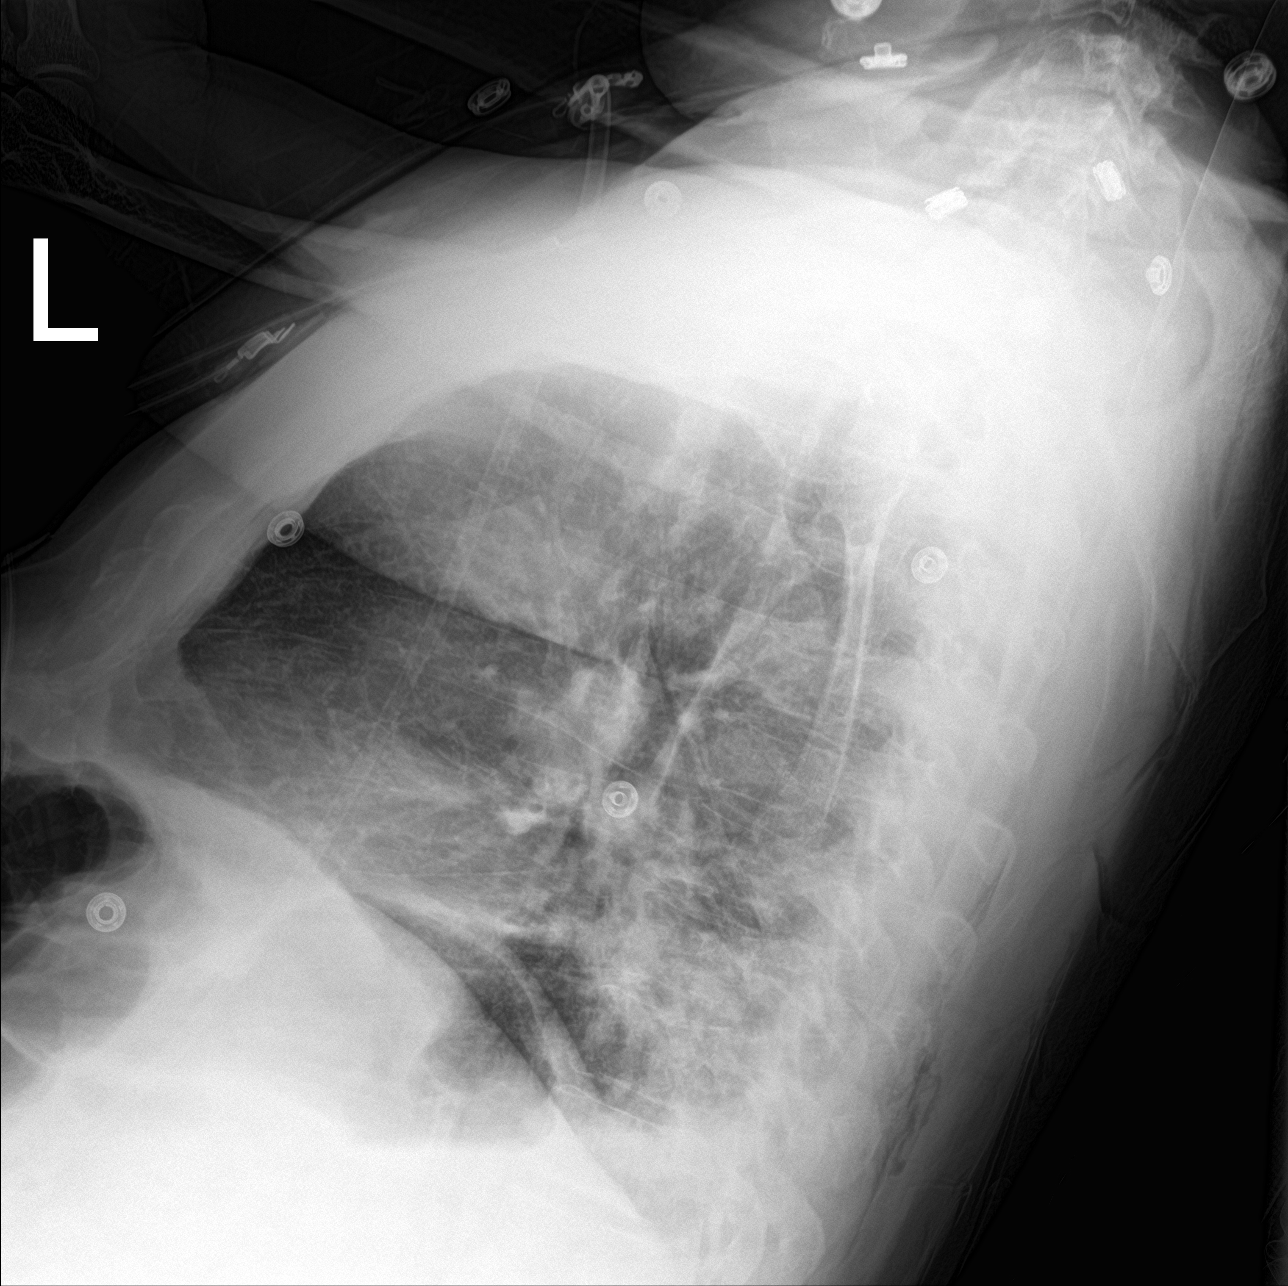

[Series 3: chest ap · 0.14mm/px · 2 of 2 slices shown]
[im 1/2]
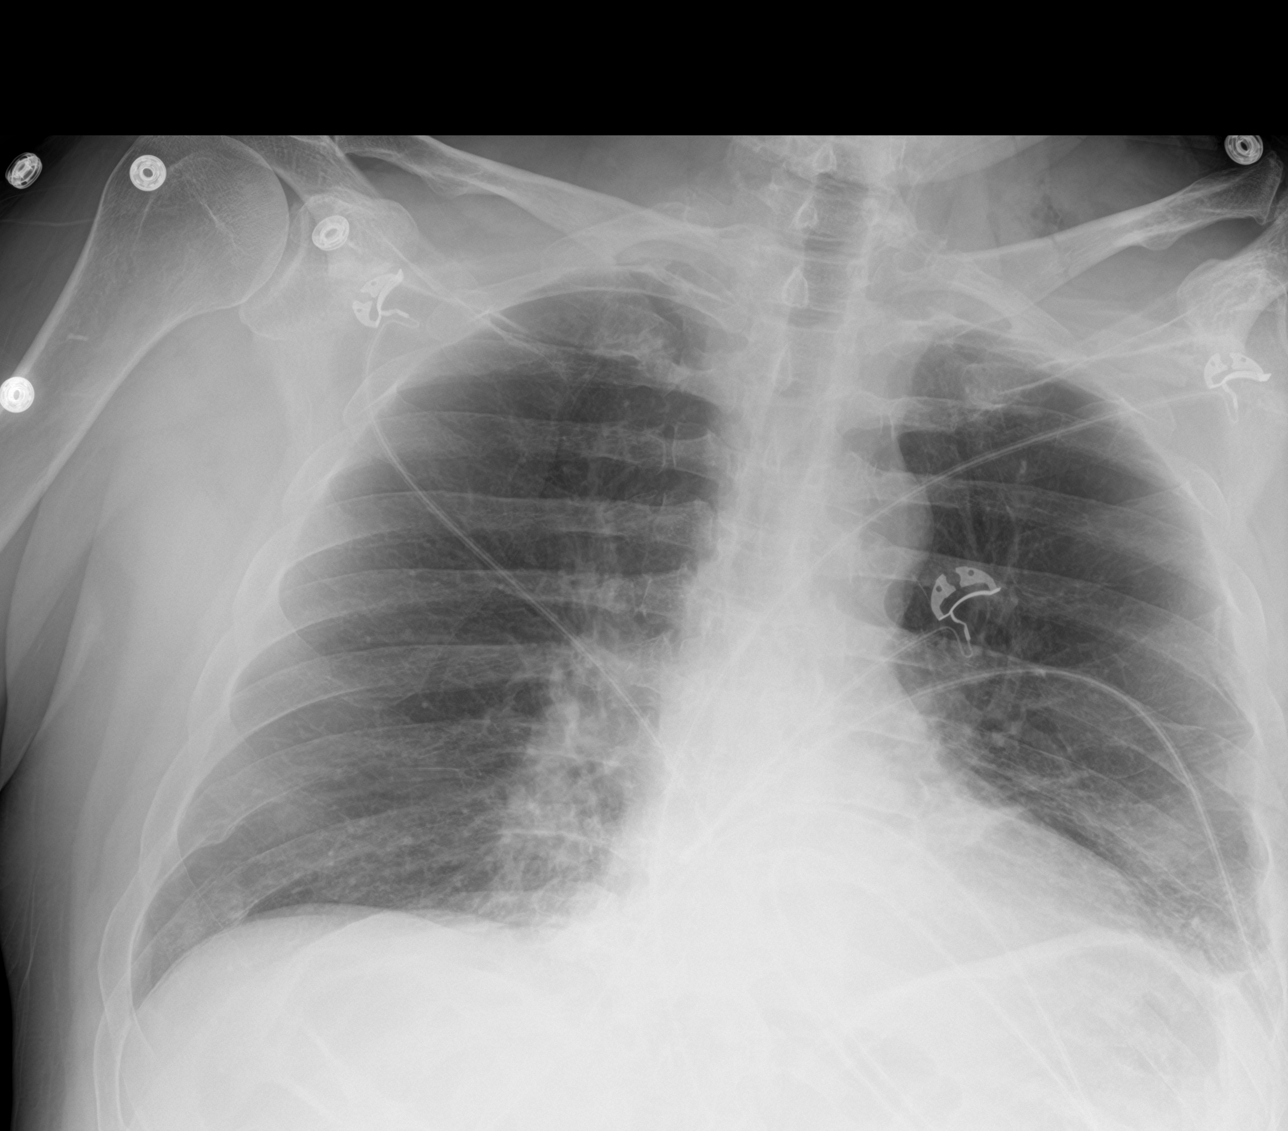
[im 2/2]
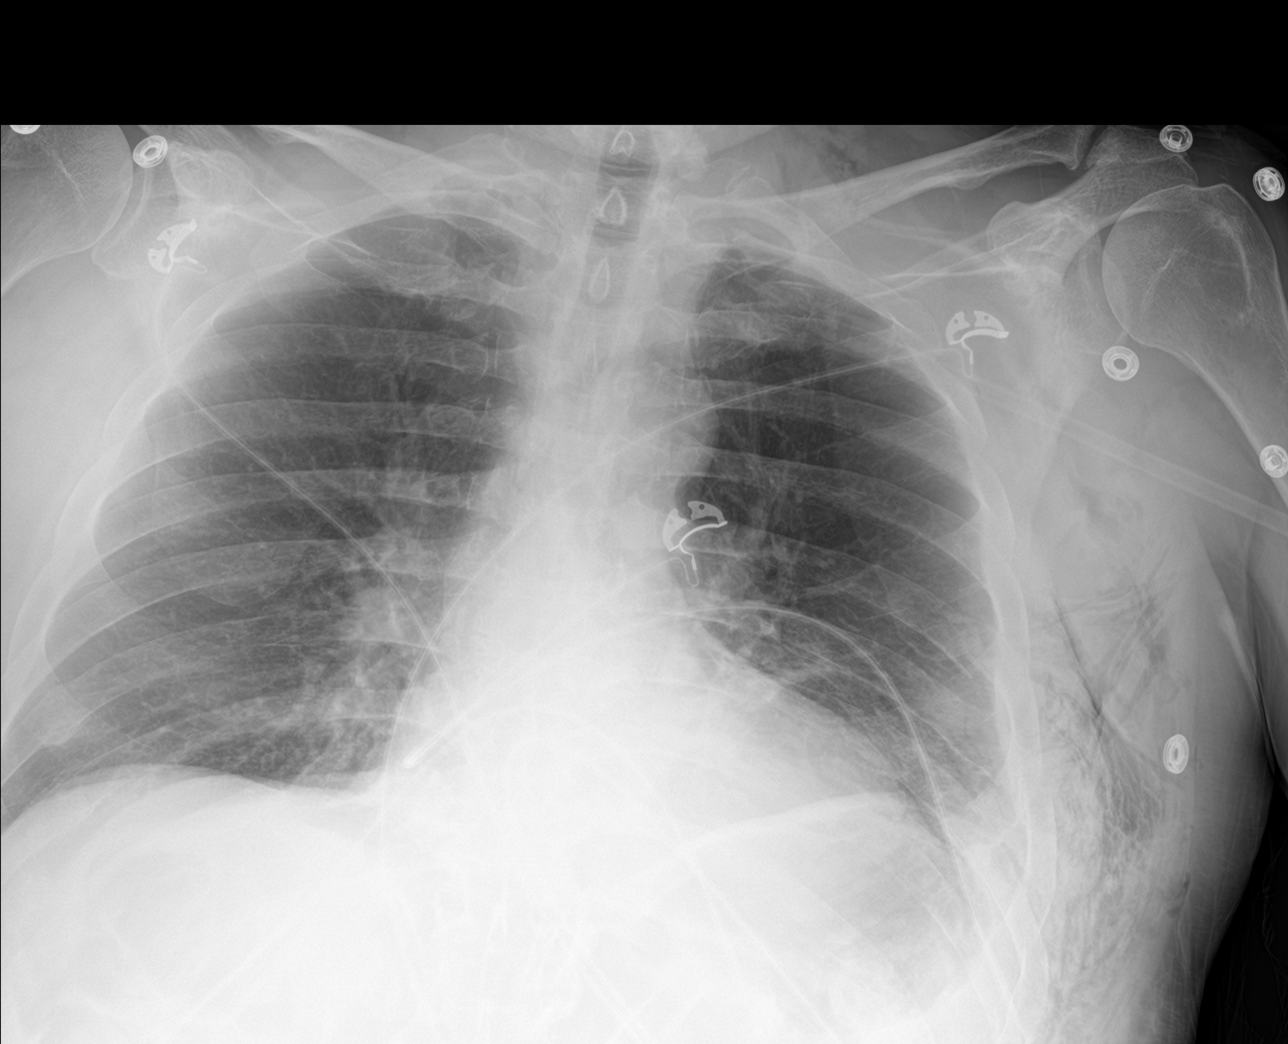

[3 of 3 positions shown; findings below may reference images not displayed]

FINDINGS: Again noted are multiple fractures of the left fifth through eighth
ribs, with overlying subcutaneous emphysema in the left chest wall.
The extent of left chest wall emphysema appears slightly decreased
compared to yesterday's examination. Some subcutaneous emphysema is
now seen tracking into the lower left cervical region. Lung volumes
are low. Left basilar opacity likely reflects subsegmental
atelectasis. Left pleural thickening, with small left pleural
effusion. No appreciable left-sided pneumothorax noted at this time.
Right lung is well aerated. No evidence of pulmonary edema. Heart
size is normal. Upper mediastinal contours are within normal limits.
IMPRESSION: 1. Multiple left-sided rib fractures involving the left fifth
through eighth ribs again noted. Decreasing subcutaneous emphysema
in the left chest wall.
2. Small left-sided pleural fluid collection, presumably a small
hemothorax, with some associated subsegmental atelectasis in the
left lower lobe.

## 2018-06-10 NOTE — Telephone Encounter (Signed)
[facility] radiology called to have angel review MR lumbar from yesterday. Please review and advise

## 2018-06-10 NOTE — Telephone Encounter (Signed)
I did, and have forwarded to [facility] his neurosurgeon, who is the one who asked for it.

## 2018-06-10 NOTE — Telephone Encounter (Signed)
Spoke with Marjory Lies at [employer]. She has sent a note to Dr. Sherwood Gambler to review the MRI and will let us know if we need to do anything further

## 2018-06-11 ENCOUNTER — Telehealth: Payer: Self-pay | Admitting: Physician Assistant

## 2018-06-11 IMAGING — CR DG CHEST 1V PORT
1 series · 1 of 1 positions shown · non-contrast
Comparison: 08/07/2016.  08/06/2016.  CT 08/05/2016 .

CLINICAL DATA: Fall.

EXAM:
PORTABLE CHEST 1 VIEW

[AP]
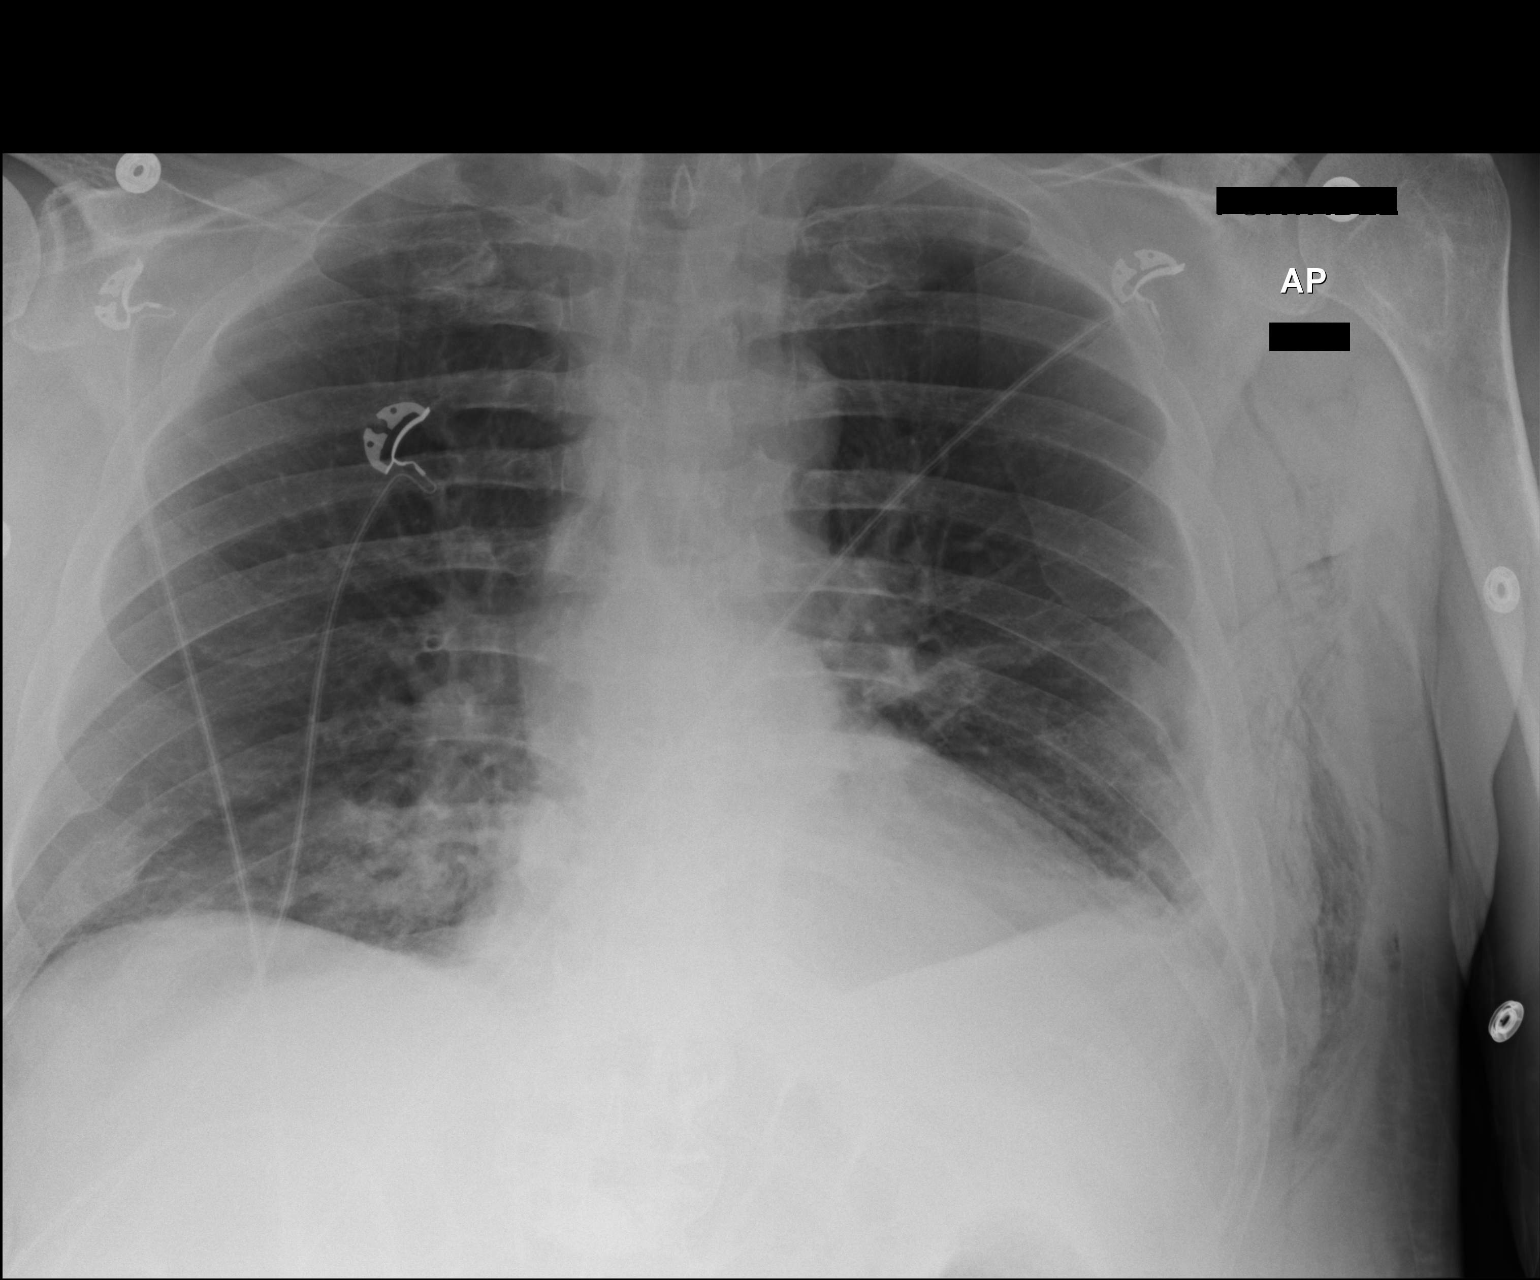

[1 of 1 positions shown; findings below may reference images not displayed]

FINDINGS: Mediastinum and hilar structures are normal. Heart size stable. New
right base infiltrate noted consistent pneumonia. Stable left base
atelectasis and small left pleural effusion. No definite
pneumothorax noted. Multiple left rib fractures and left chest wall
subcutaneous emphysema again noted.
IMPRESSION: 1.  New onset right base infiltrate consistent pneumonia.

2. Persistent left base atelectasis and small left pleural effusion.
No definite pneumothorax. Multiple left rib fractures and left chest
wall subcutaneous emphysema again noted.

## 2018-06-12 IMAGING — CR DG CHEST 1V PORT
1 series · 1 of 1 positions shown · non-contrast
Comparison: 08/08/2016

CLINICAL DATA: COPD, fall, left rib fractures

EXAM:
PORTABLE CHEST 1 VIEW

[AP]
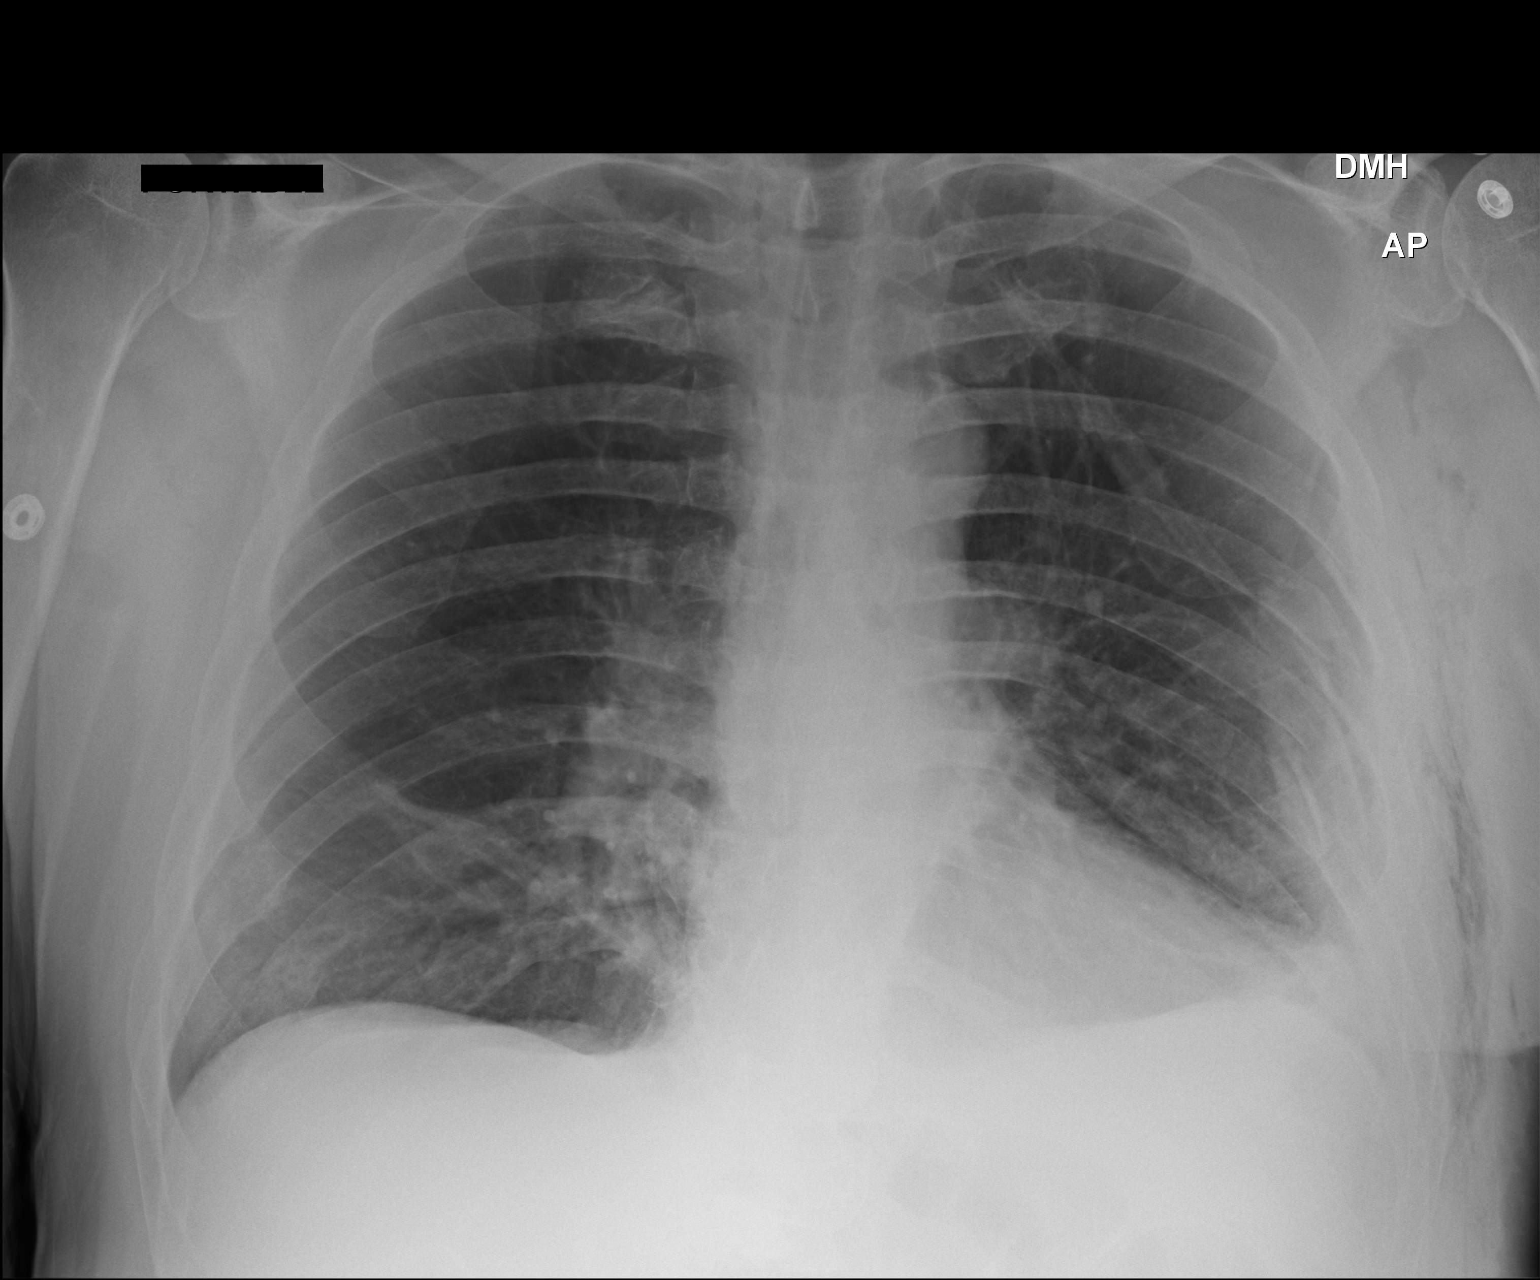

[1 of 1 positions shown; findings below may reference images not displayed]

FINDINGS: Cardiomediastinal silhouette is stable. Persistent small left
pleural effusion with left basilar atelectasis or lung contusion.
Multiple left rib fractures and subcutaneous emphysema left chest
wall again noted. Old right rib fractures are stable. There is
improvement in aeration right base. Small residual streaky right
base laterally atelectasis or infiltrate. No pneumothorax.
IMPRESSION: Persistent small left pleural effusion with left basilar atelectasis
or lung contusion. Multiple left rib fractures and subcutaneous
emphysema left chest wall again noted. Old right rib fractures are
stable. There is improvement in aeration right base. Small residual
streaky right base laterally atelectasis or infiltrate. No
pneumothorax.

## 2018-06-13 ENCOUNTER — Other Ambulatory Visit: Payer: Self-pay | Admitting: Family Medicine

## 2018-06-13 ENCOUNTER — Ambulatory Visit (INDEPENDENT_AMBULATORY_CARE_PROVIDER_SITE_OTHER): Payer: Medicare Other | Admitting: Internal Medicine

## 2018-06-13 ENCOUNTER — Encounter: Payer: Self-pay | Admitting: Internal Medicine

## 2018-06-13 VITALS — BP 126/74 | HR 77 | Temp 98.3°F | Ht 69.0 in | Wt 200.0 lb

## 2018-06-13 DIAGNOSIS — J449 Chronic obstructive pulmonary disease, unspecified: Secondary | ICD-10-CM | POA: Diagnosis not present

## 2018-06-13 MED ORDER — TIOTROPIUM BROMIDE MONOHYDRATE 2.5 MCG/ACT IN AERS: 2.0000 | INHALATION_SPRAY | Freq: Every day | RESPIRATORY_TRACT | 11 refills | Status: DC

## 2018-06-13 MED ORDER — TIOTROPIUM BROMIDE MONOHYDRATE 2.5 MCG/ACT IN AERS: 2.0000 | INHALATION_SPRAY | Freq: Every day | RESPIRATORY_TRACT | 0 refills | Status: DC

## 2018-06-13 MED ORDER — AMOXICILLIN-POT CLAVULANATE 875-125 MG PO TABS: 1.0000 | ORAL_TABLET | Freq: Two times a day (BID) | ORAL | 0 refills | Status: DC

## 2018-06-13 NOTE — Patient Instructions (Addendum)
Add on spiriva 2.5 x 2pff each am   Work on inhaler technique:  relax and gently blow all the way out then take a nice smooth deep breath back in, triggering the inhaler at same time you start breathing in.  Hold for up to 5 seconds if you can. Blow out thru nose. Rinse and gargle with water when done      If mucus stays nasty > Augmentin 875 mg take one pill twice daily  X 10 days - take at breakfast and supper with large glass of water.  It would help reduce the usual side effects (diarrhea and yeast infections) if you ate cultured yogurt at lunch.   Keep previous appt

## 2018-06-13 NOTE — Progress Notes (Signed)
Subjective:    Patient ID: Frank Rosales, male    DOB: 11/20/1955    MRN: [id number]    Brief patient profile:  62yowm "quit smoking"  11/2017  with h/o exposure to Asbestosis at [place] ? Into the 80s  still able to walk  flat fine but steps x 3 flights sob with GOLD III copd 2013 prev eval by Dr Frank Rosales  / gets annual f/u in [place]      History of Present Illness  02/16/2016  f/u ov/Frank Rosales re:  GOLD III copd /  symbicort 160 2bid / twice weekly saba  Chief Complaint  Patient presents with  . Follow-up    Pt states had CT Chest done 12/27/15- ordered by worker's comp for eval of previous asbestos exp. He states he had been doing well until July 2017 developed cough and congestion that lasted 2 months, but starting to improve.   sick July - September 2017 rx with abx/ steroids better since early Sept 2017 but CT was done in middle of this illness and was abnormal but was done for purpose of screening for asbestos  rec Plan A = Automatic = symbicort 160 Take 2 puffs first thing in am and then another 2 puffs about 12 hours later.  Work on inhaler technique:   Only use your albuterol(proair)  as a rescue medication  The key is to stop smoking completely before smoking completely stops you!      11/07/2016  f/u ov/Frank Rosales re: acute cough - maintained on symb 160 2bid  Chief Complaint  Patient presents with  . Follow-up    Pt c/o chest and nasal congestion, cough, increased SOB for the past wk. His cough is prod with very thick, clear sputum. He states "feels like I'm drowning"-seen by PCP on 6/22 and given steroid inj and abx with no relief.   acute onset with sore thorat gone w/in first day but then nasal / congestion rx by Dr Frank Rosales on 11/02/16 with pred/levaquin Mucus is now clear  Using saba 2 4 x daily  rec Plan A = Automatic = Symbicort 160  Take 2 puffs first thing in am and then another 2 puffs about 12 hours later.  Plan B = Backup Only use your albuterol as a rescue  medication Prednisone 10 mg take  4 each am x 2 days,   2 each am x 2 days,  1 each am x 2 days and stop  Try prilosec otc 20mg   Take 30-60 min before first meal of the day and Pepcid ac (famotidine) 20 mg one @  bedtime until cough is completely gone for at least a week without the need for cough suppression and stop the krill oil for the same reason until no cough at all  For cough /congestion > mucinex dm up to 1200 mg every 12 hours as needed and tramadol 50 mg up to 2 every 4 hours  For nasal congestion;  advil cold and sinus congestion every 4 hours as needed    12/23/2017  f/u ov/Frank Rosales re:   COPD III/ stopped smoking  July 2019  Chief Complaint  Patient presents with  . Follow-up    He c/o non prod cough- esp worse in the am's. Breathing is about the same. He has completely stopped smoking. He is using his rescue inhaler about once per wk.   Dyspnea:  MMRC3 = can't walk 100 yards even at a slow pace at a flat grade s stopping due  to sob   Cough: very harsh only with activity indoor vs outdoor the same/ uses mucinex dm ? Dose   Sleeping: ok p otc sleep aide on 2 pillows SABA use: rare 02: none   rec Stop fish oil when coughing   06/13/2018 acute extended  ov/Frank Rosales re:  Copd III/ still off cigs/ on pred for rash but down to 5 mg daily  Chief Complaint  Patient presents with  . Acute Visit    Pt states getting over bronchitis that he developed about a month. He has cough with minimal clear to somtimes green sputum.  He states he was txed with levaquin and a steroid injection. He rarely uses his rescue inhaler or neb.    Dyspnea:  Back to baseline  > back bothers him more than breathing most of the time  Cough: worse mid morning still green p levaquin Sleeping: on back / 2 pillows  SABA use:  2-3 x per month 02: no need  No obvious day to day or daytime variability or assoc   mucus plugs or hemoptysis or cp or chest tightness, subjective wheeze or overt sinus or hb symptoms.   Ok   without nocturnal  or early am exacerbation  of respiratory  c/o's or need for noct saba. Also denies any obvious fluctuation of symptoms with weather or environmental changes or other aggravating or alleviating factors except as outlined above   No unusual exposure hx or h/o childhood pna/ asthma or knowledge of premature birth.  Current Allergies, Complete Past Medical History, Past Surgical History, Family History, and Social History were reviewed in [employer] record.  ROS  The following are not active complaints unless bolded Hoarseness, sore throat, dysphagia, dental problems, itching, sneezing, nasal congestion or discharge of excess mucus or purulent secretions, ear ache,   fever, chills, sweats, unintended wt loss or wt gain, classically pleuritic or exertional cp,  orthopnea pnd or arm/hand swelling  or leg swelling, presyncope, palpitations, abdominal pain, anorexia, nausea, vomiting, diarrhea  or change in bowel habits or change in bladder habits, change in stools or change in urine, dysuria, hematuria,  rash, arthralgias, visual complaints, headache, numbness, weakness or ataxia or problems with walking or coordination,  change in mood or  memory.        Current Meds  Medication Sig  . acetaminophen (TYLENOL) 325 MG tablet Take 2 tablets (650 mg total) by mouth every 6 (six) hours as needed.  Marland Kitchen albuterol (PROAIR HFA) 108 (90 Base) MCG/ACT inhaler Inhale 2 puffs into the lungs every 6 (six) hours.  Marland Kitchen albuterol (PROVENTIL) (2.5 MG/3ML) 0.083% nebulizer solution Take 3 mLs (2.5 mg total) by nebulization every 6 (six) hours as needed for wheezing or shortness of breath.  Marland Kitchen aspirin 81 MG tablet Take 81 mg by mouth daily.   . budesonide-formoterol (SYMBICORT) 160-4.5 MCG/ACT inhaler TAKE 2 PUFFS BY MOUTH TWICE A DAY  . cholecalciferol (VITAMIN D) 1000 UNITS tablet Take 1,000 Units by mouth daily.   Marland Kitchen dextromethorphan-guaiFENesin (MUCINEX DM) 30-600 MG 12hr tablet Take  1 tablet by mouth 2 (two) times daily as needed for cough.  . esomeprazole (NEXIUM) 40 MG capsule Take 1 capsule (40 mg total) by mouth daily.  . folic acid (FOLVITE) 1 MG tablet Take 1 tablet (1 mg total) by mouth daily.  Marland Kitchen ibuprofen (ADVIL,MOTRIN) 400 MG tablet Take 1 tablet (400 mg total) by mouth every 4 (four) hours as needed.  . Ibuprofen-Diphenhydramine Cit (ADVIL PM PO) Take  1 tablet at bedtime as needed by mouth.  Javier Docker Oil 1000 MG CAPS Take by mouth.  . levothyroxine (SYNTHROID, LEVOTHROID) 50 MCG tablet 1 TABLET EVERY MORNING BEFORE BREAKFAST-WAIT 1 HR AFTER TAKING BEFORE EATING OR DRINKING  . NIFEdipine (ADALAT CC) 60 MG 24 hr tablet TAKE 1 TABLET BY MOUTH EVERY DAY  . olmesartan (BENICAR) 40 MG tablet Take 1 tablet (40 mg total) by mouth daily. For blood pressure  . predniSONE (DELTASONE) 10 MG tablet Take 2 tablets (20 mg total) by mouth daily with breakfast. After 1 week, take 1 tablet daily for one week. Then 1/2 tab daily. (Patient taking differently: Take 5 mg by mouth daily with breakfast. )  . triamcinolone cream (KENALOG) 0.1 %   . vitamin B-12 (CYANOCOBALAMIN) 250 MCG tablet Take 250 mcg by mouth daily.                  Objective:   Physical Exam  amb hoarse wm / slt rattle on voluntary cough    01/24/2015        180 > 02/21/2015  180  > 06/16/2015  186  > 02/16/2016  205 > 09/13/2016   185 >  11/07/2016  187 >  12/21/2016  195 > 03/25/2017   200 > 06/25/2017   198  > 12/23/2017   199  > 06/13/2018  200     11/10/14 174 lb (78.926 kg)  11/08/14 176 lb 6.4 oz (80.015 kg)  10/27/14 175 lb (79.379 kg)     Vital signs reviewed - Note on arrival 02 sats  94% on RA      Multiple missing teeth, remaining ok Insp/exp rhonchi   HEENT: nl dentition / oropharynx.  external ear canals with wax impaction bilaterally-  Mild/mod bilateral non-specific turbinate edema     NECK :  without JVD/Nodes/TM/ nl carotid upstrokes bilaterally   LUNGS: no acc muscle use,  Mild/mod  barrel  contour chest wall with bilateral insp/exp rhonchi  without cough on insp or exp maneuver and mild Hyperresonant  to  percussion bilaterally     CV:  RRR  no s3 or murmur or increase in P2, and no edema   ABD:  soft and nontender with pos mild insp Hoover's  in the supine position. No bruits or organomegaly appreciated, bowel sounds nl  MS:   Nl gait/  ext warm without deformities, calf tenderness, cyanosis or clubbing No obvious joint restrictions   SKIN: warm and dry with extensive dry macular erythematous rash over toro > ext    NEURO:  alert, approp, nl sensorium with  no motor or cerebellar deficits apparent.         I personally reviewed images and agree with radiology impression as follows:  CXR:   05/19/18 No active cardiopulmonary disease.          Assessment & Plan:

## 2018-06-13 NOTE — Assessment & Plan Note (Addendum)
Quit smoking Spirometry 11/2017   03/2011 FeV1 49%    Spirometry  02/28/2012  FEV1 45%   - Last day worked = October 13 2014  - PFT's  01/24/2015  FEV1 1.59  (43 % ) ratio 54   p 22 % improvement from saba with DLCO  88 % corrects to 99 % for alv volume s symbicort  - PFTs  12/27/15 1.30 (35%) ratio 35  In midst of a flare- - PFT's  12/21/2016  FEV1 1.40 (39 % ) ratio 50  p 8 % improvement from saba p symbicort 160 x 2  prior to study with DLCO  71/69 % corrects to 85  % for alv volume - PFTS  05/16/17  FEV1  1.32( 37%)  Ratio 37  And DLCO  52% > corrects to 78%  03/25/2017  try bevespi > preferred symb 160  - 06/13/2018  After extensive coaching inhaler device,  effectiveness =    90% with smi > try adding spiriva 2.5 x 2 each am   Persistent purulent tracheobronchitis sp rx with levaquin suggests maybe sinusitis driving the airways instability esp with R ear ache of uncertain etiology so rec   1) continue taper pred for rash 2) augmentin x 10 days then sinus ct if not better > or ent eval  3) add LAMA to laba/ics as obviously now  Group D in terms of symptom/risk and laba/lama/ICS  therefore appropriate rx at this point. 4) continue gerd rx     F/u as prev planned in 2 weeks    I had an extended discussion with the patient and daughter reviewing all relevant studies completed to date and  lasting 15 to 20 minutes of a 25 minute acute office visit    See device teaching which extended face to face time for this visit.  Each maintenance medication was reviewed in detail including emphasizing most importantly the difference between maintenance and prns and under what circumstances the prns are to be triggered using an action plan format that is not reflected in the computer generated alphabetically organized AVS which I have not found useful in most complex patients, especially with respiratory illnesses  Please see AVS for specific instructions unique to this visit that I personally wrote and  verbalized to the the pt in detail and then reviewed with pt  by my nurse highlighting any  changes in therapy recommended at today's visit to their plan of care.

## 2018-06-17 ENCOUNTER — Other Ambulatory Visit: Payer: Self-pay | Admitting: Physician Assistant

## 2018-06-24 ENCOUNTER — Inpatient Hospital Stay (HOSPITAL_COMMUNITY): Payer: Medicare Other | Attending: Internal Medicine | Admitting: Hematology

## 2018-06-24 ENCOUNTER — Encounter (HOSPITAL_COMMUNITY): Payer: Self-pay | Admitting: Hematology

## 2018-06-24 ENCOUNTER — Other Ambulatory Visit: Payer: Self-pay

## 2018-06-24 VITALS — BP 140/77 | HR 117 | Temp 98.9°F | Resp 18 | Wt 206.0 lb

## 2018-06-24 DIAGNOSIS — D751 Secondary polycythemia: Secondary | ICD-10-CM | POA: Diagnosis not present

## 2018-06-24 DIAGNOSIS — J449 Chronic obstructive pulmonary disease, unspecified: Secondary | ICD-10-CM | POA: Diagnosis not present

## 2018-06-24 NOTE — Patient Instructions (Signed)
[facility] at [facility] Discharge Instructions Follow up in 4 months with labs    Thank you for choosing [facility] at [facility] to provide your oncology and hematology care.  To afford each patient quality time with our provider, please arrive at least 15 minutes before your scheduled appointment time.   If you have a lab appointment with the Cancer Center please come in thru the  Main Entrance and check in at the main information desk  You need to re-schedule your appointment should you arrive 10 or more minutes late.  We strive to give you quality time with our providers, and arriving late affects you and other patients whose appointments are after yours.  Also, if you no show three or more times for appointments you may be dismissed from the clinic at the providers discretion.     Again, thank you for choosing [facility].  Our hope is that these requests will decrease the amount of time that you wait before being seen by our physicians.       _____________________________________________________________  Should you have questions after your visit to [facility], please contact our office at [phone] between the hours of 8:00 a.m. and 4:30 p.m.  Voicemails left after 4:00 p.m. will not be returned until the following business day.  For prescription refill requests, have your pharmacy contact our office and allow 72 hours.    Cancer Center Support Programs:   > Cancer Support Group  2nd Tuesday of the month 1pm-2pm, Journey Room    

## 2018-06-24 NOTE — Progress Notes (Signed)
[place] [place], [address] [address]   CLINIC:  Medical Oncology/Hematology  PCP:  Claretta Fraise, [place] [address] [phone]   REASON FOR VISIT: Follow-up for Polycythemia   CURRENT THERAPY: observation   INTERVAL HISTORY:  Frank Rosales 63 y.o. male returns for routine follow-up for Polycythemia. He had cold with SOB and cough since his last visit but it is improved now. He has chronic lower back pain that is unchanged. Denies any nausea, vomiting, or diarrhea. Denies any new pains. Had not noticed any recent bleeding such as epistaxis, hematuria or hematochezia. Denies recent chest pain on exertion, shortness of breath on minimal exertion, pre-syncopal episodes, or palpitations. Denies any numbness or tingling in hands or feet. Denies any recent fevers, infections, or recent hospitalizations. He denies any headaches or vision changes. Patient reports appetite at 100% and energy level at 25%.     REVIEW OF SYSTEMS:  Review of Systems  Respiratory: Positive for cough and shortness of breath.   All other systems reviewed and are negative.    PAST MEDICAL/SURGICAL HISTORY:  Past Medical History:  Diagnosis Date  . Anxiety   . Arthritis    "neck; lower back; right hip; right knee" (10/18/2014)  . Asthma   . Colon polyps   . COPD (chronic obstructive pulmonary disease) ([place])   . High blood pressure   . High cholesterol   . Hilar adenopathy   . Hypothyroidism   . Multiple rib fractures 08/2016   left side   Past Surgical History:  Procedure Laterality Date  . BACK SURGERY    . epidural injections    . FIXATION KYPHOPLASTY LUMBAR SPINE     "L1"  . KNEE ARTHROSCOPY Right 05/14/2010  . PLEURAL EFFUSION DRAINAGE Left 08/22/2016   Procedure: DRAINAGE OF HEMOTHORAX;  Surgeon: [name] Nakayama, MD;  Location: [facility];  Service: Thoracic;  Laterality: Left;  . RIB PLATING Left 08/22/2016   Procedure: RIB PLATING;  Surgeon:  [name] Nakayama, MD;  Location: [facility];  Service: Thoracic;  Laterality: Left;  Marland Kitchen VIDEO ASSISTED THORACOSCOPY Left 08/22/2016   Procedure: VIDEO ASSISTED THORACOSCOPY;  Surgeon: [name] Nakayama, MD;  Location: [facility];  Service: Thoracic;  Laterality: Left;  . WISDOM TOOTH EXTRACTION       SOCIAL HISTORY:  Social History   Socioeconomic History  . Marital status: Married    Spouse name: Not on file  . Number of children: 1  . Years of education: Not on file  . Highest education level: Not on file  Occupational History  . Occupation: Company secretary: [employer]  Social Needs  . Financial resource strain: Not on file  . Food insecurity:    Worry: Not on file    Inability: Not on file  . Transportation needs:    Medical: Not on file    Non-medical: Not on file  Tobacco Use  . Smoking status: Former Smoker    Packs/day: 2.00    Years: 45.00    Pack years: 90.00    Types: Cigarettes    Last attempt to quit: 11/11/2017    Years since quitting: 0.6  . Smokeless tobacco: Never Used  . Tobacco comment:     Substance and Sexual Activity  . Alcohol use: Yes    Alcohol/week: 42.0 standard drinks    Types: 42 Cans of beer per week    Comment: 6 beer a day   .  Drug use: No  . Sexual activity: Not Currently  Lifestyle  . Physical activity:    Days per week: Not on file    Minutes per session: Not on file  . Stress: Not on file  Relationships  . Social connections:    Talks on phone: Not on file    Gets together: Not on file    Attends religious service: Not on file    Active member of club or organization: Not on file    Attends meetings of clubs or organizations: Not on file    Relationship status: Not on file  . Intimate partner violence:    Fear of current or ex partner: Not on file    Emotionally abused: Not on file    Physically abused: Not on file    Forced sexual activity: Not on file  Other Topics Concern  . Not on file  Social History Narrative  . Not  on file    FAMILY HISTORY:  Family History  Problem Relation Age of Onset  . Emphysema Father   . Lung cancer Father   . Heart disease Brother   . Heart failure Mother     CURRENT MEDICATIONS:  Outpatient Encounter Medications as of 06/24/2018  Medication Sig  . acetaminophen (TYLENOL) 325 MG tablet Take 2 tablets (650 mg total) by mouth every 6 (six) hours as needed.  Marland Kitchen albuterol (PROAIR HFA) 108 (90 Base) MCG/ACT inhaler Inhale 2 puffs into the lungs every 6 (six) hours.  Marland Kitchen albuterol (PROVENTIL) (2.5 MG/3ML) 0.083% nebulizer solution Take 3 mLs (2.5 mg total) by nebulization every 6 (six) hours as needed for wheezing or shortness of breath.  Marland Kitchen aspirin 81 MG tablet Take 81 mg by mouth daily.   . budesonide-formoterol (SYMBICORT) 160-4.5 MCG/ACT inhaler TAKE 2 PUFFS BY MOUTH TWICE A DAY  . cholecalciferol (VITAMIN D) 1000 UNITS tablet Take 1,000 Units by mouth daily.   Marland Kitchen dextromethorphan-guaiFENesin (MUCINEX DM) 30-600 MG 12hr tablet Take 1 tablet by mouth 2 (two) times daily as needed for cough.  . esomeprazole (NEXIUM) 40 MG capsule Take 1 capsule (40 mg total) by mouth daily.  . folic acid (FOLVITE) 1 MG tablet Take 1 tablet (1 mg total) by mouth daily.  . Ibuprofen-Diphenhydramine Cit (ADVIL PM PO) Take 1 tablet at bedtime as needed by mouth.  Javier Docker Oil 1000 MG CAPS Take by mouth.  . levothyroxine (SYNTHROID, LEVOTHROID) 50 MCG tablet 1 TABLET EVERY MORNING BEFORE BREAKFAST-WAIT 1 HR AFTER TAKING BEFORE EATING OR DRINKING  . NIFEdipine (ADALAT CC) 60 MG 24 hr tablet TAKE 1 TABLET BY MOUTH EVERY DAY  . olmesartan (BENICAR) 40 MG tablet Take 1 tablet (40 mg total) by mouth daily. For blood pressure  . Tiotropium Bromide Monohydrate (SPIRIVA RESPIMAT) 2.5 MCG/ACT AERS Inhale 2 puffs into the lungs daily.  Marland Kitchen triamcinolone cream (KENALOG) 0.1 %   . vitamin B-12 (CYANOCOBALAMIN) 250 MCG tablet Take 250 mcg by mouth daily.  . [DISCONTINUED] amoxicillin-clavulanate (AUGMENTIN) 875-125 MG  tablet Take 1 tablet by mouth 2 (two) times daily.  . [DISCONTINUED] predniSONE (DELTASONE) 10 MG tablet Take 2 tablets (20 mg total) by mouth daily with breakfast. After 1 week, take 1 tablet daily for one week. Then 1/2 tab daily. (Patient taking differently: Take 5 mg by mouth daily with breakfast. )  . ibuprofen (ADVIL,MOTRIN) 400 MG tablet Take 1 tablet (400 mg total) by mouth every 4 (four) hours as needed. (Patient not taking: Reported on 06/24/2018)   No  facility-administered encounter medications on file as of 06/24/2018.     ALLERGIES:  Allergies  Allergen Reactions  . No Known Allergies      PHYSICAL EXAM:  ECOG Performance status: 1  Vitals:   06/24/18 0900  BP: 140/77  Pulse: (!) 117  Resp: 18  Temp: 98.9 F (37.2 C)  SpO2: 94%   Filed Weights   06/24/18 0900  Weight: 206 lb (93.4 kg)    Physical Exam Constitutional:      Appearance: Normal appearance. He is normal weight.  Musculoskeletal: Normal range of motion.  Skin:    General: Skin is warm and dry.  Neurological:     Mental Status: He is alert and oriented to person, place, and time. Mental status is at baseline.  Psychiatric:        Mood and Affect: Mood normal.        Behavior: Behavior normal.        Thought Content: Thought content normal.        Judgment: Judgment normal.      LABORATORY DATA:  I have reviewed the labs as listed.  CBC    Component Value Date/Time   WBC 11.6 (H) 06/04/2018 1419   RBC 4.82 06/04/2018 1419   HGB 16.5 06/04/2018 1419   HGB 18.8 (H) 05/02/2018 1212   HCT 48.0 06/04/2018 1419   HCT 53.9 (H) 05/02/2018 1212   PLT 236 06/04/2018 1419   PLT 250 05/02/2018 1212   MCV 99.6 06/04/2018 1419   MCV 97 05/02/2018 1212   MCH 34.2 (H) 06/04/2018 1419   MCHC 34.4 06/04/2018 1419   RDW 14.4 06/04/2018 1419   RDW 13.2 05/02/2018 1212   LYMPHSABS 1.3 06/04/2018 1419   LYMPHSABS 1.1 05/02/2018 1212   MONOABS 1.1 (H) 06/04/2018 1419   EOSABS 0.2 06/04/2018 1419    EOSABS 0.3 05/02/2018 1212   BASOSABS 0.1 06/04/2018 1419   BASOSABS 0.1 05/02/2018 1212   CMP Latest Ref Rng & Units 06/04/2018 05/02/2018 12/27/2017  Glucose 70 - 99 mg/dL 130(H) 116(H) 145(H)  BUN 6 - 20 mg/dL 13 10 6   Creatinine 0.61 - 1.24 mg/dL 0.96 0.97 0.90  Sodium 135 - 145 mmol/L 134(L) 139 135  Potassium 3.5 - 5.1 mmol/L 4.0 4.1 3.8  Chloride 98 - 111 mmol/L 97(L) 97 94(L)  CO2 22 - 32 mmol/L 25 21 22   Calcium 8.9 - 10.3 mg/dL 9.0 9.5 9.5  Total Protein 6.5 - 8.1 g/dL 7.6 7.5 7.0  Total Bilirubin 0.3 - 1.2 mg/dL 1.0 1.1 0.6  Alkaline Phos 38 - 126 U/L 86 104 97  AST 15 - 41 U/L 30 24 16   ALT 0 - 44 U/L 43 32 21       DIAGNOSTIC IMAGING:  I have independently reviewed the scans and discussed with the patient.   I have reviewed Francene Finders, NP's note and agree with the documentation.  I personally performed a face-to-face visit, made revisions and my assessment and plan is as follows.    ASSESSMENT & PLAN:   Polycythemia, secondary 1.  Polycythemia: - CBC on 05/02/2018 shows hemoglobin of 18.8 and hematocrit of 54. -Review of his CBC from past year shows elevated hemoglobin in 2018. - Patient has a history of heavy smoking, quit few months ago.  He also has COPD and uses inhalers. - No vasomotor symptoms or erythromelalgia.  No aquagenic pruritus. - Family history significant for his father with a history of phlebotomies for a brief time. -His prednisone  was tapered off from last visit. - Denied any headaches or vision changes.  No splenomegaly was found. - I have discussed the results of blood work dated 06/04/2018, hemoglobin improved to 16.5 and hematocrit of 48. - I have done Jak 2 V6 41F testing which was negative. - We will see him back in 4 months for follow-up with repeat CBC. -If he continues to have normal hemoglobin, I will discharge him from the clinic.  2.  COPD: - Because of his work exposure to different chemicals, he gets annual CT scans done by  his pulmonologist in [place].      Orders placed this encounter:  Orders Placed This Encounter  Procedures  . CBC with Differential/Platelet  . Comprehensive metabolic panel  . Lactate dehydrogenase      Derek Jack, MD [place] [phone]

## 2018-06-24 NOTE — Assessment & Plan Note (Signed)
1.  Polycythemia: - CBC on 05/02/2018 shows hemoglobin of 18.8 and hematocrit of 54. -Review of his CBC from past year shows elevated hemoglobin in 2018. - Patient has a history of heavy smoking, quit few months ago.  He also has COPD and uses inhalers. - No vasomotor symptoms or erythromelalgia.  No aquagenic pruritus. - Family history significant for his father with a history of phlebotomies for a brief time. -His prednisone was tapered off from last visit. - Denied any headaches or vision changes.  No splenomegaly was found. - I have discussed the results of blood work dated 06/04/2018, hemoglobin improved to 16.5 and hematocrit of 48. - I have done Jak 2 V6 49F testing which was negative. - We will see him back in 4 months for follow-up with repeat CBC. -If he continues to have normal hemoglobin, I will discharge him from the clinic.  2.  COPD: - Because of his work exposure to different chemicals, he gets annual CT scans done by his pulmonologist in [place].

## 2018-06-26 ENCOUNTER — Ambulatory Visit: Payer: Medicare Other | Admitting: Internal Medicine

## 2018-06-27 ENCOUNTER — Ambulatory Visit: Payer: Medicare Other | Admitting: Internal Medicine

## 2018-06-27 ENCOUNTER — Encounter: Payer: Self-pay | Admitting: Internal Medicine

## 2018-06-27 VITALS — BP 124/88 | HR 108 | Ht 69.0 in | Wt 204.0 lb

## 2018-06-27 DIAGNOSIS — J449 Chronic obstructive pulmonary disease, unspecified: Secondary | ICD-10-CM | POA: Diagnosis not present

## 2018-06-27 DIAGNOSIS — R05 Cough: Secondary | ICD-10-CM | POA: Diagnosis not present

## 2018-06-27 DIAGNOSIS — R059 Cough, unspecified: Secondary | ICD-10-CM

## 2018-06-27 DIAGNOSIS — R058 Other specified cough: Secondary | ICD-10-CM

## 2018-06-27 MED ORDER — PREDNISONE 10 MG PO TABS: ORAL_TABLET | ORAL | 0 refills | Status: DC

## 2018-06-27 MED ORDER — AMOXICILLIN-POT CLAVULANATE 875-125 MG PO TABS: 1.0000 | ORAL_TABLET | Freq: Two times a day (BID) | ORAL | 0 refills | Status: AC

## 2018-06-27 NOTE — Progress Notes (Signed)
Subjective:   Patient ID: Frank Rosales, male    DOB: 09-21-1955    MRN: [id number]    Brief patient profile:  62yowm "quit smoking"  11/2017  with h/o exposure to Asbestosis at [place] ? Into the 80s  still able to walk  flat fine but steps x 3 flights sob with GOLD III copd 2013 prev eval by Dr Joya Gaskins  / gets annual f/u in [place]     History of Present Illness  02/16/2016  f/u ov/Frank Rosales re:  GOLD III copd /  symbicort 160 2bid / twice weekly saba  Chief Complaint  Patient presents with  . Follow-up    Pt states had CT Chest done 12/27/15- ordered by worker's comp for eval of previous asbestos exp. He states he had been doing well until July 2017 developed cough and congestion that lasted 2 months, but starting to improve.   sick July - September 2017 rx with abx/ steroids better since early Sept 2017 but CT was done in middle of this illness and was abnormal but was done for purpose of screening for asbestos  rec Plan A = Automatic = symbicort 160 Take 2 puffs first thing in am and then another 2 puffs about 12 hours later.  Work on inhaler technique:   Only use your albuterol(proair)  as a rescue medication  The key is to stop smoking completely before smoking completely stops you!      11/07/2016  f/u ov/Frank Rosales re: acute cough - maintained on symb 160 2bid  Chief Complaint  Patient presents with  . Follow-up    Pt c/o chest and nasal congestion, cough, increased SOB for the past wk. His cough is prod with very thick, clear sputum. He states "feels like I'm drowning"-seen by PCP on 6/22 and given steroid inj and abx with no relief.   acute onset with sore thorat gone w/in first day but then nasal / congestion rx by Dr Quinn Axe on 11/02/16 with pred/levaquin Mucus is now clear  Using saba 2 4 x daily  rec Plan A = Automatic = Symbicort 160  Take 2 puffs first thing in am and then another 2 puffs about 12 hours later.  Plan B = Backup Only use your albuterol as a rescue  medication Prednisone 10 mg take  4 each am x 2 days,   2 each am x 2 days,  1 each am x 2 days and stop  Try prilosec otc 20mg   Take 30-60 min before first meal of the day and Pepcid ac (famotidine) 20 mg one @  bedtime until cough is completely gone for at least a week without the need for cough suppression and stop the krill oil for the same reason until no cough at all  For cough /congestion > mucinex dm up to 1200 mg every 12 hours as needed and tramadol 50 mg up to 2 every 4 hours  For nasal congestion;  advil cold and sinus congestion every 4 hours as needed    12/23/2017  f/u ov/Frank Rosales re:   COPD III/ stopped smoking  July 2019  Chief Complaint  Patient presents with  . Follow-up    He c/o non prod cough- esp worse in the am's. Breathing is about the same. He has completely stopped smoking. He is using his rescue inhaler about once per wk.   Dyspnea:  MMRC3 = can't walk 100 yards even at a slow pace at a flat grade s stopping due to sob  Cough: very harsh only with activity indoor vs outdoor the same/ uses mucinex dm ? Dose   Sleeping: ok p otc sleep aide on 2 pillows SABA use: rare 02: none   rec Stop fish oil when coughing   06/13/2018 acute extended  ov/Frank Rosales re:  Copd III/ still off cigs/ on pred for rash but down to 5 mg daily  Chief Complaint  Patient presents with  . Acute Visit    Pt states getting over bronchitis that he developed about a month. He has cough with minimal clear to somtimes green sputum.  He states he was txed with levaquin and a steroid injection. He rarely uses his rescue inhaler or neb.    Dyspnea:  Back to baseline  > back bothers him more than breathing most of the time  Cough: worse mid morning still green p levaquin Sleeping: on back / 2 pillows  SABA use:  2-3 x per month rec Add on spiriva 2.5 x 2 pff each am  Work on inhaler technique:   Augmentin 875 mg take one pill twice daily  X 10 days  > transiently better    06/27/2018  f/u ov/Frank Rosales re:  copd III/ not convinced better on spiriva vs off  Chief Complaint  Patient presents with  . Follow-up    Pt states he is about the same since last visit. Has complaints of cough with green phlegm and SOB with exertion.  Dyspnea:  MMRC3 = can't walk 100 yards even at a slow pace at a flat grade s stopping due to sob   Cough: worse in am  Assoc with nasal congestion  Sleeping: on back/ 2 pillows  SABA use: rarely     No obvious day to day or daytime variability or assoc excess/ purulent sputum or mucus plugs or hemoptysis or cp or chest tightness, subjective wheeze or overt sinus or hb symptoms.   Sleeping above  without nocturnal  or   exacerbation  of respiratory  c/o's or need for noct saba. Also denies any obvious fluctuation of symptoms with weather or environmental changes or other aggravating or alleviating factors except as outlined above   No unusual exposure hx or h/o childhood pna/ asthma or knowledge of premature birth.  Current Allergies, Complete Past Medical History, Past Surgical History, Family History, and Social History were reviewed in [employer] record.  ROS  The following are not active complaints unless bolded Hoarseness, sore throat, dysphagia, dental problems, itching, sneezing,  nasal congestion or discharge of excess mucus or purulent secretions, ear ache on R,   fever, chills, sweats, unintended wt loss or wt gain, classically pleuritic or exertional cp,  orthopnea pnd or arm/hand swelling  or leg swelling, presyncope, palpitations, abdominal pain, anorexia, nausea, vomiting, diarrhea  or change in bowel habits or change in bladder habits, change in stools or change in urine, dysuria, hematuria,  rash, arthralgias, visual complaints, headache, numbness, weakness or ataxia or problems with walking or coordination,  change in mood or  memory.        Current Meds  Medication Sig  . acetaminophen (TYLENOL) 325 MG tablet Take 2 tablets (650 mg  total) by mouth every 6 (six) hours as needed.  Marland Kitchen albuterol (PROAIR HFA) 108 (90 Base) MCG/ACT inhaler Inhale 2 puffs into the lungs every 6 (six) hours.  Marland Kitchen albuterol (PROVENTIL) (2.5 MG/3ML) 0.083% nebulizer solution Take 3 mLs (2.5 mg total) by nebulization every 6 (six) hours as needed for wheezing or  shortness of breath.  Marland Kitchen aspirin 81 MG tablet Take 81 mg by mouth daily.   . budesonide-formoterol (SYMBICORT) 160-4.5 MCG/ACT inhaler TAKE 2 PUFFS BY MOUTH TWICE A DAY  . cholecalciferol (VITAMIN D) 1000 UNITS tablet Take 1,000 Units by mouth daily.   Marland Kitchen dextromethorphan-guaiFENesin (MUCINEX DM) 30-600 MG 12hr tablet Take 1 tablet by mouth 2 (two) times daily as needed for cough.  . esomeprazole (NEXIUM) 40 MG capsule Take 1 capsule (40 mg total) by mouth daily.  . folic acid (FOLVITE) 1 MG tablet Take 1 tablet (1 mg total) by mouth daily.  Marland Kitchen ibuprofen (ADVIL,MOTRIN) 400 MG tablet Take 1 tablet (400 mg total) by mouth every 4 (four) hours as needed.  . Ibuprofen-Diphenhydramine Cit (ADVIL PM PO) Take 1 tablet at bedtime as needed by mouth.  Javier Docker Oil 1000 MG CAPS Take by mouth.  . levothyroxine (SYNTHROID, LEVOTHROID) 50 MCG tablet 1 TABLET EVERY MORNING BEFORE BREAKFAST-WAIT 1 HR AFTER TAKING BEFORE EATING OR DRINKING  . NIFEdipine (ADALAT CC) 60 MG 24 hr tablet TAKE 1 TABLET BY MOUTH EVERY DAY  . olmesartan (BENICAR) 40 MG tablet Take 1 tablet (40 mg total) by mouth daily. For blood pressure  . triamcinolone cream (KENALOG) 0.1 %   . vitamin B-12 (CYANOCOBALAMIN) 250 MCG tablet Take 250 mcg by mouth daily.                    Objective:   Physical Exam    amb wm/ gruff voice/ rattling cough    01/24/2015        180 > 02/21/2015  180  > 06/16/2015  186  > 02/16/2016  205 > 09/13/2016   185 >  11/07/2016  187 >  12/21/2016  195 > 03/25/2017   200 > 06/25/2017   198  > 12/23/2017   199  > 06/13/2018  200 > 06/27/2018     11/10/14 174 lb (78.926 kg)  11/08/14 176 lb 6.4 oz (80.015 kg)   10/27/14 175 lb (79.379 kg)    Vital signs reviewed - Note on arrival 02 sats  93 % on RA      HEENT: nl dentition / oropharynx.  external ear canals L wax impaction -  Mod bilateral non-specific turbinate edema     NECK :  without JVD/Nodes/TM/ nl carotid upstrokes bilaterally   LUNGS: no acc muscle use,  Mod barrel  contour chest wall with bilateral insp/exp rhonchi  and  without cough on insp or exp maneuver and mod  Hyperresonant  to  percussion bilaterally     CV:  RRR  no s3 or murmur or increase in P2, and no edema   ABD:  soft and nontender with pos mid  insp Hoover's  in the supine position. No bruits or organomegaly appreciated, bowel sounds nl  MS:   Nl gait/  ext warm without deformities, calf tenderness, cyanosis or clubbing No obvious joint restrictions   SKIN: warm and dry with extensive dry macular erythematous rash  NEURO:  alert, approp, nl sensorium with  no motor or cerebellar deficits apparent.               Assessment & Plan:

## 2018-06-27 NOTE — Patient Instructions (Addendum)
Prednisone 10 mg take  4 each am x 2 days,   2 each am x 2 days,  1 each am x 2 days and stop   Augmentin 875 mg take one pill twice daily  X 10 days - take at breakfast and supper with large glass of water.  It would help reduce the usual side effects (diarrhea and yeast infections) if you ate cultured yogurt at lunch.   Mucinex dm up to 1200  every 12 hours as needed   Adding pepcid 20 mg one at bedtime for 2 weeks to see if helps your am cough    Please see patient coordinator before you leave today  to schedule sinus CT   Please schedule a follow up office visit in 6 weeks, call sooner if needed with all medications /inhalers/ solutions in hand so we can verify exactly what you are taking. This includes all medications from all doctors and over the counters

## 2018-06-28 ENCOUNTER — Encounter: Payer: Self-pay | Admitting: Internal Medicine

## 2018-06-28 NOTE — Assessment & Plan Note (Signed)
Check sinus ct ? Needs ent eval next    I had an extended discussion with the patient reviewing all relevant studies completed to date and  lasting 15 to 20 minutes of a 25 minute visit    Each maintenance medication was reviewed in detail including most importantly the difference between maintenance and prns and under what circumstances the prns are to be triggered using an action plan format that is not reflected in the computer generated alphabetically organized AVS.     Please see AVS for specific instructions unique to this visit that I personally wrote and verbalized to the the pt in detail and then reviewed with pt  by my nurse highlighting any  changes in therapy recommended at today's visit to their plan of care.

## 2018-07-01 ENCOUNTER — Ambulatory Visit (INDEPENDENT_AMBULATORY_CARE_PROVIDER_SITE_OTHER): Payer: Medicare Other | Admitting: Family Medicine

## 2018-07-01 ENCOUNTER — Encounter: Payer: Self-pay | Admitting: Family Medicine

## 2018-07-01 VITALS — BP 128/76 | HR 99 | Temp 97.8°F | Ht 69.0 in | Wt 205.0 lb

## 2018-07-01 DIAGNOSIS — Z7689 Persons encountering health services in other specified circumstances: Secondary | ICD-10-CM | POA: Diagnosis not present

## 2018-07-01 DIAGNOSIS — E7800 Pure hypercholesterolemia, unspecified: Secondary | ICD-10-CM

## 2018-07-01 DIAGNOSIS — E039 Hypothyroidism, unspecified: Secondary | ICD-10-CM | POA: Diagnosis not present

## 2018-07-01 DIAGNOSIS — D7589 Other specified diseases of blood and blood-forming organs: Secondary | ICD-10-CM

## 2018-07-01 DIAGNOSIS — J449 Chronic obstructive pulmonary disease, unspecified: Secondary | ICD-10-CM | POA: Diagnosis not present

## 2018-07-01 DIAGNOSIS — R6889 Other general symptoms and signs: Secondary | ICD-10-CM | POA: Diagnosis not present

## 2018-07-01 MED ORDER — ALBUTEROL SULFATE HFA 108 (90 BASE) MCG/ACT IN AERS: 2.0000 | INHALATION_SPRAY | Freq: Four times a day (QID) | RESPIRATORY_TRACT | 2 refills | Status: DC

## 2018-07-01 MED ORDER — BUDESONIDE-FORMOTEROL FUMARATE 160-4.5 MCG/ACT IN AERO: INHALATION_SPRAY | RESPIRATORY_TRACT | 1 refills | Status: DC

## 2018-07-01 MED ORDER — LEVOTHYROXINE SODIUM 50 MCG PO TABS: ORAL_TABLET | ORAL | 1 refills | Status: DC

## 2018-07-01 NOTE — Progress Notes (Signed)
Subjective:  Patient ID: Frank Rosales, male    DOB: Jul 20, 1955  Age: 63 y.o. MRN: [id number]  CC: Medical Management of Chronic Issues   HPI Frank Rosales presents for Patient presents for follow-up on  thyroid. The patient has a history of hypothyroidism for many years. It has been stable recently. Pt. denies any change in  voice, loss of hair, heat or cold intolerance. Energy level has been adequate to good. Patient denies constipation and diarrhea. No myxedema. Medication is as noted below. Verified that pt is taking it daily on an empty stomach. Well tolerated.  Under care of pulmonary for increased cough recently. Dr. Melvyn Novas has ordered CT sinus and started him on Nexium for reflux. Some daily productivity from the cough.  Depression screen [facility] 2/9 07/01/2018 06/02/2018 05/19/2018  Decreased Interest 0 0 0  Down, Depressed, Hopeless 0 0 0  PHQ - 2 Score 0 0 0  Altered sleeping - - -  Tired, decreased energy - - -  Change in appetite - - -  Feeling bad or failure about yourself  - - -  Trouble concentrating - - -  Moving slowly or fidgety/restless - - -  Suicidal thoughts - - -  PHQ-9 Score - - -    History Frank Rosales has a past medical history of Anxiety, Arthritis, Asthma, Colon polyps, COPD (chronic obstructive pulmonary disease) ([place]), High blood pressure, High cholesterol, Hilar adenopathy, Hypothyroidism, and Multiple rib fractures (08/2016).   He has a past surgical history that includes Knee arthroscopy (Right, 05/14/2010); Wisdom tooth extraction; epidural injections; Fixation kyphoplasty lumbar spine; Back surgery; Video assisted thoracoscopy (Left, 08/22/2016); Pleural effusion drainage (Left, 08/22/2016); and Rib plating (Left, 08/22/2016).   His family history includes Emphysema in his father; Heart disease in his brother; Heart failure in his mother; Lung cancer in his father.He reports that he quit smoking about 7 months ago. His smoking use included cigarettes. He has a  90.00 pack-year smoking history. He has never used smokeless tobacco. He reports current alcohol use of about 42.0 standard drinks of alcohol per week. He reports that he does not use drugs.    ROS Review of Systems  Constitutional: Negative.   HENT: Negative.   Eyes: Negative for visual disturbance.  Respiratory: Negative for cough and shortness of breath.   Cardiovascular: Negative for chest pain and leg swelling.  Gastrointestinal: Negative for abdominal pain, diarrhea, nausea and vomiting.  Genitourinary: Negative for difficulty urinating.  Musculoskeletal: Negative for arthralgias and myalgias.  Skin: Negative for rash.  Neurological: Negative for headaches.  Psychiatric/Behavioral: Negative for sleep disturbance.    Objective:  BP 128/76   Pulse 99   Temp 97.8 F (36.6 C) (Oral)   Ht 5' 9"  (1.753 m)   Wt 205 lb (93 kg)   SpO2 94%   BMI 30.27 kg/m   BP Readings from Last 3 Encounters:  07/01/18 128/76  06/27/18 124/88  06/24/18 140/77    Wt Readings from Last 3 Encounters:  07/01/18 205 lb (93 kg)  06/27/18 204 lb (92.5 kg)  06/24/18 206 lb (93.4 kg)     Physical Exam Constitutional:      General: He is not in acute distress.    Appearance: He is well-developed.  HENT:     Head: Normocephalic and atraumatic.     Right Ear: External ear normal.     Left Ear: External ear normal.     Nose: Nose normal.  Eyes:     Conjunctiva/sclera:  Conjunctivae normal.     Pupils: Pupils are equal, round, and reactive to light.  Neck:     Musculoskeletal: Normal range of motion and neck supple.  Cardiovascular:     Rate and Rhythm: Normal rate and regular rhythm.     Heart sounds: Normal heart sounds. No murmur.  Pulmonary:     Effort: Pulmonary effort is normal. No respiratory distress.     Breath sounds: Normal breath sounds. No wheezing or rales.  Abdominal:     Palpations: Abdomen is soft.     Tenderness: There is no abdominal tenderness.  Musculoskeletal:  Normal range of motion.  Skin:    General: Skin is warm and dry.  Neurological:     Mental Status: He is alert and oriented to person, place, and time.     Deep Tendon Reflexes: Reflexes are normal and symmetric.  Psychiatric:        Behavior: Behavior normal.        Thought Content: Thought content normal.        Judgment: Judgment normal.       Assessment & Plan:   Frank Rosales was seen today for medical management of chronic issues.  Diagnoses and all orders for this visit:  Hypothyroidism, unspecified type -     TSH -     T4, Free  COPD GOLD III  -     albuterol (PROAIR HFA) 108 (90 Base) MCG/ACT inhaler; Inhale 2 puffs into the lungs every 6 (six) hours. -     budesonide-formoterol (SYMBICORT) 160-4.5 MCG/ACT inhaler; TAKE 2 PUFFS BY MOUTH TWICE A DAY  High cholesterol -     CBC with Differential/Platelet -     CMP14+EGFR -     Lipid panel  Other orders -     levothyroxine (SYNTHROID, LEVOTHROID) 50 MCG tablet; 1 TABLET EVERY MORNING BEFORE BREAKFAST-WAIT 1 HR AFTER TAKING BEFORE EATING OR DRINKING       I have discontinued Frank Rosales's ibuprofen. I am also having him maintain his aspirin, cholecalciferol, vitamin S-49, folic acid, acetaminophen, Ibuprofen-Diphenhydramine Cit (ADVIL PM PO), Krill Oil, dextromethorphan-guaiFENesin, olmesartan, albuterol, triamcinolone cream, esomeprazole, NIFEdipine, predniSONE, amoxicillin-clavulanate, albuterol, budesonide-formoterol, and levothyroxine.  Allergies as of 07/01/2018      Reactions   No Known Allergies       Medication List       Accurate as of July 01, 2018  8:47 PM. Always use your most recent med list.        acetaminophen 325 MG tablet Commonly known as:  TYLENOL Take 2 tablets (650 mg total) by mouth every 6 (six) hours as needed.   ADVIL PM PO Take 1 tablet at bedtime as needed by mouth.   albuterol (2.5 MG/3ML) 0.083% nebulizer solution Commonly known as:  PROVENTIL Take 3 mLs (2.5 mg  total) by nebulization every 6 (six) hours as needed for wheezing or shortness of breath.   albuterol 108 (90 Base) MCG/ACT inhaler Commonly known as:  PROAIR HFA Inhale 2 puffs into the lungs every 6 (six) hours.   amoxicillin-clavulanate 875-125 MG tablet Commonly known as:  AUGMENTIN Take 1 tablet by mouth 2 (two) times daily for 10 days.   aspirin 81 MG tablet Take 81 mg by mouth daily.   budesonide-formoterol 160-4.5 MCG/ACT inhaler Commonly known as:  SYMBICORT TAKE 2 PUFFS BY MOUTH TWICE A DAY   cholecalciferol 1000 units tablet Commonly known as:  VITAMIN D Take 1,000 Units by mouth daily.   dextromethorphan-guaiFENesin 30-600 MG 12hr  tablet Commonly known as:  MUCINEX DM Take 1 tablet by mouth 2 (two) times daily as needed for cough.   esomeprazole 40 MG capsule Commonly known as:  NEXIUM Take 1 capsule (40 mg total) by mouth daily.   folic acid 1 MG tablet Commonly known as:  FOLVITE Take 1 tablet (1 mg total) by mouth daily.   Krill Oil 1000 MG Caps Take by mouth.   levothyroxine 50 MCG tablet Commonly known as:  SYNTHROID, LEVOTHROID 1 TABLET EVERY MORNING BEFORE BREAKFAST-WAIT 1 HR AFTER TAKING BEFORE EATING OR DRINKING   NIFEdipine 60 MG 24 hr tablet Commonly known as:  ADALAT CC TAKE 1 TABLET BY MOUTH EVERY DAY   olmesartan 40 MG tablet Commonly known as:  BENICAR Take 1 tablet (40 mg total) by mouth daily. For blood pressure   predniSONE 10 MG tablet Commonly known as:  DELTASONE Take  4 each am x 2 days,   2 each am x 2 days,  1 each am x 2 days and stop   triamcinolone cream 0.1 % Commonly known as:  KENALOG   vitamin B-12 250 MCG tablet Commonly known as:  CYANOCOBALAMIN Take 250 mcg by mouth daily.        Follow-up: Return in about 6 months (around 12/30/2018).  Claretta Fraise, M.D.

## 2018-07-01 NOTE — Patient Instructions (Signed)
DASH Eating Plan  DASH stands for "Dietary Approaches to Stop Hypertension." The DASH eating plan is a healthy eating plan that has been shown to reduce high blood pressure (hypertension). It may also reduce your risk for type 2 diabetes, heart disease, and stroke. The DASH eating plan may also help with weight loss.  What are tips for following this plan?    General guidelines   Avoid eating more than 2,300 mg (milligrams) of salt (sodium) a day. If you have hypertension, you may need to reduce your sodium intake to 1,500 mg a day.   Limit alcohol intake to no more than 1 drink a day for nonpregnant women and 2 drinks a day for men. One drink equals 12 oz of beer, 5 oz of wine, or 1 oz of hard liquor.   Work with your health care provider to maintain a healthy body weight or to lose weight. Ask what an ideal weight is for you.   Get at least 30 minutes of exercise that causes your heart to beat faster (aerobic exercise) most days of the week. Activities may include walking, swimming, or biking.   Work with your health care provider or diet and nutrition specialist (dietitian) to adjust your eating plan to your individual calorie needs.  Reading food labels     Check food labels for the amount of sodium per serving. Choose foods with less than 5 percent of the Daily Value of sodium. Generally, foods with less than 300 mg of sodium per serving fit into this eating plan.   To find whole grains, look for the word "whole" as the first word in the ingredient list.  Shopping   Buy products labeled as "low-sodium" or "no salt added."   Buy fresh foods. Avoid canned foods and premade or frozen meals.  Cooking   Avoid adding salt when cooking. Use salt-free seasonings or herbs instead of table salt or sea salt. Check with your health care provider or pharmacist before using salt substitutes.   Do not fry foods. Cook foods using healthy methods such as baking, boiling, grilling, and broiling instead.   Cook with  heart-healthy oils, such as olive, canola, soybean, or sunflower oil.  Meal planning   Eat a balanced diet that includes:  ? 5 or more servings of fruits and vegetables each day. At each meal, try to fill half of your plate with fruits and vegetables.  ? Up to 6-8 servings of whole grains each day.  ? Less than 6 oz of lean meat, poultry, or fish each day. A 3-oz serving of meat is about the same size as a deck of cards. One egg equals 1 oz.  ? 2 servings of low-fat dairy each day.  ? A serving of nuts, seeds, or beans 5 times each week.  ? Heart-healthy fats. Healthy fats called Omega-3 fatty acids are found in foods such as flaxseeds and coldwater fish, like sardines, salmon, and mackerel.   Limit how much you eat of the following:  ? Canned or prepackaged foods.  ? Food that is high in trans fat, such as fried foods.  ? Food that is high in saturated fat, such as fatty meat.  ? Sweets, desserts, sugary drinks, and other foods with added sugar.  ? Full-fat dairy products.   Do not salt foods before eating.   Try to eat at least 2 vegetarian meals each week.   Eat more home-cooked food and less restaurant, buffet, and fast food.     When eating at a restaurant, ask that your food be prepared with less salt or no salt, if possible.  What foods are recommended?  The items listed may not be a complete list. Talk with your dietitian about what dietary choices are best for you.  Grains  Whole-grain or whole-wheat bread. Whole-grain or whole-wheat pasta. Brown rice. Oatmeal. Quinoa. Bulgur. Whole-grain and low-sodium cereals. Pita bread. Low-fat, low-sodium crackers. Whole-wheat flour tortillas.  Vegetables  Fresh or frozen vegetables (raw, steamed, roasted, or grilled). Low-sodium or reduced-sodium tomato and vegetable juice. Low-sodium or reduced-sodium tomato sauce and tomato paste. Low-sodium or reduced-sodium canned vegetables.  Fruits  All fresh, dried, or frozen fruit. Canned fruit in natural juice (without  added sugar).  Meat and other protein foods  Skinless chicken or turkey. Ground chicken or turkey. Pork with fat trimmed off. Fish and seafood. Egg whites. Dried beans, peas, or lentils. Unsalted nuts, nut butters, and seeds. Unsalted canned beans. Lean cuts of beef with fat trimmed off. Low-sodium, lean deli meat.  Dairy  Low-fat (1%) or fat-free (skim) milk. Fat-free, low-fat, or reduced-fat cheeses. Nonfat, low-sodium ricotta or cottage cheese. Low-fat or nonfat yogurt. Low-fat, low-sodium cheese.  Fats and oils  Soft margarine without trans fats. Vegetable oil. Low-fat, reduced-fat, or light mayonnaise and salad dressings (reduced-sodium). Canola, safflower, olive, soybean, and sunflower oils. Avocado.  Seasoning and other foods  Herbs. Spices. Seasoning mixes without salt. Unsalted popcorn and pretzels. Fat-free sweets.  What foods are not recommended?  The items listed may not be a complete list. Talk with your dietitian about what dietary choices are best for you.  Grains  Baked goods made with fat, such as croissants, muffins, or some breads. Dry pasta or rice meal packs.  Vegetables  Creamed or fried vegetables. Vegetables in a cheese sauce. Regular canned vegetables (not low-sodium or reduced-sodium). Regular canned tomato sauce and paste (not low-sodium or reduced-sodium). Regular tomato and vegetable juice (not low-sodium or reduced-sodium). Pickles. Olives.  Fruits  Canned fruit in a light or heavy syrup. Fried fruit. Fruit in cream or butter sauce.  Meat and other protein foods  Fatty cuts of meat. Ribs. Fried meat. Bacon. Sausage. Bologna and other processed lunch meats. Salami. Fatback. Hotdogs. Bratwurst. Salted nuts and seeds. Canned beans with added salt. Canned or smoked fish. Whole eggs or egg yolks. Chicken or turkey with skin.  Dairy  Whole or 2% milk, cream, and half-and-half. Whole or full-fat cream cheese. Whole-fat or sweetened yogurt. Full-fat cheese. Nondairy creamers. Whipped toppings.  Processed cheese and cheese spreads.  Fats and oils  Butter. Stick margarine. Lard. Shortening. Ghee. Bacon fat. Tropical oils, such as coconut, palm kernel, or palm oil.  Seasoning and other foods  Salted popcorn and pretzels. Onion salt, garlic salt, seasoned salt, table salt, and sea salt. Worcestershire sauce. Tartar sauce. Barbecue sauce. Teriyaki sauce. Soy sauce, including reduced-sodium. Steak sauce. Canned and packaged gravies. Fish sauce. Oyster sauce. Cocktail sauce. Horseradish that you find on the shelf. Ketchup. Mustard. Meat flavorings and tenderizers. Bouillon cubes. Hot sauce and Tabasco sauce. Premade or packaged marinades. Premade or packaged taco seasonings. Relishes. Regular salad dressings.  Where to find more information:   National Heart, Lung, and Blood Institute: www.nhlbi.nih.gov   American Heart Association: www.heart.org  Summary   The DASH eating plan is a healthy eating plan that has been shown to reduce high blood pressure (hypertension). It may also reduce your risk for type 2 diabetes, heart disease, and stroke.   With the   DASH eating plan, you should limit salt (sodium) intake to 2,300 mg a day. If you have hypertension, you may need to reduce your sodium intake to 1,500 mg a day.   When on the DASH eating plan, aim to eat more fresh fruits and vegetables, whole grains, lean proteins, low-fat dairy, and heart-healthy fats.   Work with your health care provider or diet and nutrition specialist (dietitian) to adjust your eating plan to your individual calorie needs.  This information is not intended to replace advice given to you by your health care provider. Make sure you discuss any questions you have with your health care provider.  Document Released: 04/19/2011 Document Revised: 04/23/2016 Document Reviewed: 04/23/2016  [employer]vier Interactive Patient Education  2019 [employer]

## 2018-07-02 LAB — CMP14+EGFR
ALT: 32 IU/L (ref 0–44)
AST: 20 IU/L (ref 0–40)
Albumin/Globulin Ratio: 1.7 (ref 1.2–2.2)
Albumin: 4.7 g/dL (ref 3.8–4.9)
Alkaline Phosphatase: 169 IU/L — ABNORMAL HIGH (ref 39–117)
BILIRUBIN TOTAL: 0.5 mg/dL (ref 0.0–1.2)
BUN/Creatinine Ratio: 9 — ABNORMAL LOW (ref 10–24)
BUN: 8 mg/dL (ref 8–27)
CO2: 22 mmol/L (ref 20–29)
Calcium: 9.8 mg/dL (ref 8.6–10.2)
Chloride: 99 mmol/L (ref 96–106)
Creatinine, Ser: 0.85 mg/dL (ref 0.76–1.27)
GFR calc Af Amer: 109 mL/min/{1.73_m2} (ref >59)
GFR calc non Af Amer: 95 mL/min/{1.73_m2} (ref >59)
Globulin, Total: 2.7 g/dL (ref 1.5–4.5)
Glucose: 137 mg/dL — ABNORMAL HIGH (ref 65–99)
Potassium: 4.7 mmol/L (ref 3.5–5.2)
Sodium: 140 mmol/L (ref 134–144)
Total Protein: 7.4 g/dL (ref 6.0–8.5)

## 2018-07-02 LAB — CBC WITH DIFFERENTIAL/PLATELET
Basophils Absolute: 0.1 10*3/uL (ref 0.0–0.2)
Basos: 1 %
EOS (ABSOLUTE): 0.1 10*3/uL (ref 0.0–0.4)
Eos: 1 %
Hematocrit: 44.1 % (ref 37.5–51.0)
Hemoglobin: 15 g/dL (ref 13.0–17.7)
Immature Grans (Abs): 0 10*3/uL (ref 0.0–0.1)
Immature Granulocytes: 0 %
Lymphocytes Absolute: 1.2 10*3/uL (ref 0.7–3.1)
Lymphs: 16 %
MCH: 34.5 pg — ABNORMAL HIGH (ref 26.6–33.0)
MCHC: 34 g/dL (ref 31.5–35.7)
MCV: 101 fL — ABNORMAL HIGH (ref 79–97)
MONOCYTES: 9 %
Monocytes Absolute: 0.7 10*3/uL (ref 0.1–0.9)
Neutrophils Absolute: 5.3 10*3/uL (ref 1.4–7.0)
Neutrophils: 73 %
Platelets: 300 10*3/uL (ref 150–450)
RBC: 4.35 x10E6/uL (ref 4.14–5.80)
RDW: 14.3 % (ref 11.6–15.4)
WBC: 7.3 10*3/uL (ref 3.4–10.8)

## 2018-07-02 LAB — LIPID PANEL
Chol/HDL Ratio: 3.3 ratio (ref 0.0–5.0)
Cholesterol, Total: 226 mg/dL — ABNORMAL HIGH (ref 100–199)
HDL: 69 mg/dL (ref >39)
LDL Calculated: 139 mg/dL — ABNORMAL HIGH (ref 0–99)
Triglycerides: 89 mg/dL (ref 0–149)
VLDL Cholesterol Cal: 18 mg/dL (ref 5–40)

## 2018-07-02 LAB — TSH: TSH: 2.2 u[IU]/mL (ref 0.450–4.500)

## 2018-07-02 LAB — T4, FREE: Free T4: 1.44 ng/dL (ref 0.82–1.77)

## 2018-07-02 NOTE — Addendum Note (Signed)
Addended by: Shelbie Ammons on: 07/02/2018 09:28 AM   Modules accepted: Orders

## 2018-07-04 ENCOUNTER — Other Ambulatory Visit: Payer: Self-pay | Admitting: *Deleted

## 2018-07-04 LAB — B12 AND FOLATE PANEL
Folate: 13.9 ng/mL (ref >3.0)
Vitamin B-12: 943 pg/mL (ref 232–1245)

## 2018-07-04 LAB — SPECIMEN STATUS REPORT

## 2018-07-07 ENCOUNTER — Ambulatory Visit (INDEPENDENT_AMBULATORY_CARE_PROVIDER_SITE_OTHER)
Admission: RE | Admit: 2018-07-07 | Discharge: 2018-07-07 | Disposition: A | Discharge status: Home / Self Care | Payer: Medicare Other | Source: Ambulatory Visit | Attending: Internal Medicine | Admitting: Internal Medicine

## 2018-07-07 DIAGNOSIS — R058 Other specified cough: Secondary | ICD-10-CM

## 2018-07-07 DIAGNOSIS — R05 Cough: Secondary | ICD-10-CM | POA: Diagnosis not present

## 2018-07-07 DIAGNOSIS — R0982 Postnasal drip: Secondary | ICD-10-CM | POA: Diagnosis not present

## 2018-07-07 DIAGNOSIS — R059 Cough, unspecified: Secondary | ICD-10-CM

## 2018-07-07 NOTE — Progress Notes (Signed)
Left detailed msg on machine ok per DPR

## 2018-07-15 ENCOUNTER — Ambulatory Visit: Payer: Medicare Other | Admitting: *Deleted

## 2018-07-18 ENCOUNTER — Encounter: Payer: Self-pay | Admitting: *Deleted

## 2018-07-18 ENCOUNTER — Ambulatory Visit (INDEPENDENT_AMBULATORY_CARE_PROVIDER_SITE_OTHER): Payer: Medicare Other | Admitting: *Deleted

## 2018-07-18 VITALS — BP 143/93 | HR 99 | Ht 69.0 in | Wt 205.0 lb

## 2018-07-18 DIAGNOSIS — Z0000 Encounter for general adult medical examination without abnormal findings: Secondary | ICD-10-CM | POA: Diagnosis not present

## 2018-07-18 NOTE — Progress Notes (Signed)
Subjective:   Frank Rosales is a 63 y.o. male who presents for a Initial Medicare Annual Wellness Visit.  Mr. Bognar worked at [employer] for 37 years until he went out of work on disability due to worsening COPD, back, and knee problems.  He enjoys reading.  He lives at home with his wife and their dog.  He has one adult daughter.  He states he finds it difficult to do the things he used to like working on cars and in his shop due to his breathing problems.  Encouraged him to stay as active as possible.  Suggested getting a stationary bicycle and riding it as tolerated to help improve endurance.   Patient Care Team: Claretta Fraise, MD as PCP - General (Family Medicine) Elsie Stain, MD as Attending Physician (Pulmonary Disease) Tanda Rockers, MD as Consulting Physician (Pulmonary Disease) Derek Jack, MD as Consulting Physician (Hematology)  Hospitalizations, surgeries, and ER visits in previous 12 months No hospitalizations, ER visits, or surgeries this past year.   Review of Systems    Patient reports that his overall health is unchanged compared to last year.  Cardiac Risk Factors include: advanced age (>70men, >79 women);dyslipidemia;family history of premature cardiovascular disease;hypertension;male gender;sedentary lifestyle  Musculoskeletal -back and right knee pain  All other systems negative       Current Medications (verified) Outpatient Encounter Medications as of 07/18/2018  Medication Sig  . acetaminophen (TYLENOL) 325 MG tablet Take 2 tablets (650 mg total) by mouth every 6 (six) hours as needed.  Marland Kitchen albuterol (PROAIR HFA) 108 (90 Base) MCG/ACT inhaler Inhale 2 puffs into the lungs every 6 (six) hours.  Marland Kitchen albuterol (PROVENTIL) (2.5 MG/3ML) 0.083% nebulizer solution Take 3 mLs (2.5 mg total) by nebulization every 6 (six) hours as needed for wheezing or shortness of breath.  Marland Kitchen aspirin 81 MG tablet Take 81 mg by mouth daily.   . budesonide-formoterol  (SYMBICORT) 160-4.5 MCG/ACT inhaler TAKE 2 PUFFS BY MOUTH TWICE A DAY  . cholecalciferol (VITAMIN D) 1000 UNITS tablet Take 1,000 Units by mouth daily.   Marland Kitchen dextromethorphan-guaiFENesin (MUCINEX DM) 30-600 MG 12hr tablet Take 1 tablet by mouth 2 (two) times daily as needed for cough.  . Famotidine (PEPCID PO) Take 1 tablet by mouth daily.  . folic acid (FOLVITE) 1 MG tablet Take 1 tablet (1 mg total) by mouth daily.  Javier Docker Oil 1000 MG CAPS Take by mouth.  . levothyroxine (SYNTHROID, LEVOTHROID) 50 MCG tablet 1 TABLET EVERY MORNING BEFORE BREAKFAST-WAIT 1 HR AFTER TAKING BEFORE EATING OR DRINKING  . NIFEdipine (ADALAT CC) 60 MG 24 hr tablet TAKE 1 TABLET BY MOUTH EVERY DAY  . olmesartan (BENICAR) 40 MG tablet Take 1 tablet (40 mg total) by mouth daily. For blood pressure  . Tiotropium Bromide Monohydrate (SPIRIVA HANDIHALER IN) Inhale 1 puff into the lungs daily.  Marland Kitchen triamcinolone cream (KENALOG) 0.1 %   . vitamin B-12 (CYANOCOBALAMIN) 250 MCG tablet Take 250 mcg by mouth daily.  . [DISCONTINUED] predniSONE (DELTASONE) 10 MG tablet Take  4 each am x 2 days,   2 each am x 2 days,  1 each am x 2 days and stop  . [DISCONTINUED] esomeprazole (NEXIUM) 40 MG capsule Take 1 capsule (40 mg total) by mouth daily. (Patient not taking: Reported on 07/18/2018)  . [DISCONTINUED] Ibuprofen-Diphenhydramine Cit (ADVIL PM PO) Take 1 tablet at bedtime as needed by mouth.   No facility-administered encounter medications on file as of 07/18/2018.  Allergies (verified) No known allergies   History: Past Medical History:  Diagnosis Date  . Anxiety   . Arthritis    "neck; lower back; right hip; right knee" (10/18/2014)  . Asthma   . Colon polyps   . COPD (chronic obstructive pulmonary disease) ([place])   . High blood pressure   . High cholesterol   . Hilar adenopathy   . Hypothyroidism   . Multiple rib fractures 08/2016   left side   Past Surgical History:  Procedure Laterality Date  . BACK SURGERY    .  epidural injections    . FIXATION KYPHOPLASTY LUMBAR SPINE     "L1"  . KNEE ARTHROSCOPY Right 05/14/2010  . PLEURAL EFFUSION DRAINAGE Left 08/22/2016   Procedure: DRAINAGE OF HEMOTHORAX;  Surgeon: [name] Nakayama, MD;  Location: [facility];  Service: Thoracic;  Laterality: Left;  . RIB PLATING Left 08/22/2016   Procedure: RIB PLATING;  Surgeon: [name] Nakayama, MD;  Location: [facility];  Service: Thoracic;  Laterality: Left;  Marland Kitchen VIDEO ASSISTED THORACOSCOPY Left 08/22/2016   Procedure: VIDEO ASSISTED THORACOSCOPY;  Surgeon: [name] Nakayama, MD;  Location: [facility];  Service: Thoracic;  Laterality: Left;  . WISDOM TOOTH EXTRACTION     Family History  Problem Relation Age of Onset  . Emphysema Father   . Lung cancer Father   . Heart disease Brother   . Heart failure Mother   . Diabetes Mother    Social History   Socioeconomic History  . Marital status: Married    Spouse name: Not on file  . Number of children: 1  . Years of education: Not on file  . Highest education level: Associate degree: occupational, Hotel manager, or vocational program  Occupational History  . Occupation: Company secretary: [employer]  Social Needs  . Financial resource strain: Not hard at all  . Food insecurity:    Worry: Never true    Inability: Never true  . Transportation needs:    Medical: No    Non-medical: No  Tobacco Use  . Smoking status: Former Smoker    Packs/day: 1.50    Years: 45.00    Pack years: 67.50    Types: Cigarettes    Last attempt to quit: 11/11/2017    Years since quitting: 0.6  . Smokeless tobacco: Never Used  . Tobacco comment:     Substance and Sexual Activity  . Alcohol use: Yes    Alcohol/week: 42.0 standard drinks    Types: 42 Cans of beer per week    Comment: 6 beer a day   . Drug use: No  . Sexual activity: Not Currently  Lifestyle  . Physical activity:    Days per week: 0 days    Minutes per session: 0 min  . Stress: To some extent  Relationships  . Social  connections:    Talks on phone: More than three times a week    Gets together: More than three times a week    Attends religious service: 1 to 4 times per year    Active member of club or organization: No    Attends meetings of clubs or organizations: Never    Relationship status: Married  Other Topics Concern  . Not on file  Social History Narrative  . Not on file     Clinical Intake:     Pain Score: 2  Activities of Daily Living In your present state of health, do you have any difficulty performing the following activities: 07/18/2018  Hearing? N  Vision? N  Difficulty concentrating or making decisions? N  Walking or climbing stairs? Y  Comment due to breathing problems  Dressing or bathing? Y  Comment Due to breathing problems  Doing errands, shopping? N  Preparing Food and eating ? N  Using the Toilet? N  In the past six months, have you accidently leaked urine? N  Do you have problems with loss of bowel control? N  Managing your Medications? N  Managing your Finances? N  Housekeeping or managing your Housekeeping? N  Some recent data might be hidden     Exercise Current Exercise Habits: The patient does not participate in regular exercise at present, Exercise limited by: respiratory conditions(s)  Diet Consumes 2 meals a day and 1 snacks a day.  The patient feels that he mostly follow a Regular diet.  Diet History  Patient states he usually eats a sandwich midday and protein a vegetable and bread for supper, peanut butter crackers for snack.  Recommended a diet of mostly vegetables, fruits, lean proteins, and whole grains.  Patient has access to all the food he needs.    Depression Screen PHQ 2/9 Scores 07/18/2018 07/01/2018 06/02/2018 05/19/2018 05/02/2018 12/27/2017 11/23/2017  PHQ - 2 Score 0 0 0 0 0 0 0  PHQ- 9 Score - - - - - - -     Fall Risk Fall Risk  07/18/2018 06/02/2018 05/19/2018 12/27/2017 02/04/2017  Falls in the past year? 1  0 0 No No  Number falls in past yr: 1 - - - -  Injury with Fall? 1 - - - -  Risk for fall due to : Impaired balance/gait;History of fall(s) - - - -     Objective:    Today's Vitals   07/18/18 0954  BP: (!) 143/93  Pulse: 99  Weight: 205 lb (93 kg)  Height: 5\' 9"  (1.753 m)  PainSc: 2   PainLoc: Back   Body mass index is 30.27 kg/m.  Advanced Directives 07/18/2018 06/24/2018 06/04/2018 08/20/2016 08/06/2016 08/05/2016 09/27/2015  Does Patient Have a Medical Advance Directive? No No No No No No No  Would patient like information on creating a medical advance directive? Yes (MAU/Ambulatory/Procedural Areas - Information given) - No - Patient declined No - Patient declined No - Patient declined No - Patient declined -    Hearing/Vision  No hearing or vision deficits noted during visit.  Cognitive Function: MMSE - Mini Mental State Exam 07/18/2018  Orientation to time 5  Orientation to Place 5  Registration 3  Attention/ Calculation 5  Recall 3  Language- name 2 objects 2  Language- repeat 1  Language- follow 3 step command 3  Language- read & follow direction 1  Write a sentence 1  Copy design 0  Total score 29           Immunizations and Health Maintenance Immunization History  Administered Date(s) Administered  . Influenza Split 02/12/2012, 02/11/2013  . Influenza Whole 03/12/2011  . Influenza,inj,Quad PF,6+ Mos 02/21/2015, 03/07/2016, 03/06/2017, 02/20/2018  . Pneumococcal Conjugate-13 02/21/2015  . Pneumococcal Polysaccharide-23 04/14/2013  . Td 05/14/2002  . Tdap 02/06/2013   There are no preventive care reminders to display for this patient. Health Maintenance  Topic Date Due  . TETANUS/TDAP  02/07/2023  . COLONOSCOPY  02/23/2023  . INFLUENZA VACCINE  Completed  . Hepatitis C Screening  Completed  . HIV Screening  Completed   Shingrix declined today      Assessment:   This is a routine wellness examination for [place].    Plan:    Goals    .  Reduce alcohol intake     Do other activities such as visit a friend, taking a drive, exercising when feeling the urge to drink alcohol.         Health Maintenance & Additional Screening Recommendations: Advanced directives: has NO advanced directive  - add't info requested. Referral to SW: no  Lung: Low Dose CT Chest recommended if Age 7-80 years, 30 pack-year currently smoking OR have quit w/in 15years. Patient does qualify. Hepatitis C Screening recommended: completed 12/05/2015 HIV screening recommended: completed 03/07/2016    Keep f/u with Claretta Fraise, MD and any other specialty appointments you may have Continue current medications Move carefully to avoid falls.  Aim for at least 150 minutes of moderate activity a week. Stationary bicycle may be a great option for you.  Read or work on puzzles daily Stay connected with friends and family Work on your goal of reducing alcohol intake.  I have personally reviewed and noted the following in the patient's chart:   . Medical and social history . Use of alcohol, tobacco or illicit drugs  . Current medications and supplements . Functional ability and status . Nutritional status . Physical activity . Advanced directives . List of other physicians . Hospitalizations, surgeries, and ER visits in previous 12 months . Vitals . Screenings to include cognitive, depression, and falls . Referrals and appointments  In addition, I have reviewed and discussed with patient certain preventive protocols, quality metrics, and best practice recommendations. A written personalized care plan for preventive services as well as general preventive health recommendations were provided to patient.     Nolberto Hanlon, RN  07/18/2018

## 2018-07-18 NOTE — Patient Instructions (Addendum)
Please work on your goal of reducing alcohol intake - by distracting yourself - visiting a friend, taking a drive, or exercising are good options.   Please review the information given on Advance Directives.  If you complete the paperwork, please bring a copy to our office to be filed in your medical record.   Please consider getting the Shingrix (Shingles) vaccine in the future.   Please continue to move carefully to avoid falls.  Please follow up with Dr. Livia Snellen and your specialists as scheduled.  Thank you for coming in for your Annual Wellness Visit today!  Preventive Care 40-64 Years, Male Preventive care refers to lifestyle choices and visits with your health care provider that can promote health and wellness. What does preventive care include?   A yearly physical exam. This is also called an annual well check.  Dental exams once or twice a year.  Routine eye exams. Ask your health care provider how often you should have your eyes checked.  Personal lifestyle choices, including: ? Daily care of your teeth and gums. ? Regular physical activity. ? Eating a healthy diet. ? Avoiding tobacco and drug use. ? Limiting alcohol use. ? Practicing safe sex. ? Taking low-dose aspirin every day starting at age 49. What happens during an annual well check? The services and screenings done by your health care provider during your annual well check will depend on your age, overall health, lifestyle risk factors, and family history of disease. Counseling Your health care provider may ask you questions about your:  Alcohol use.  Tobacco use.  Drug use.  Emotional well-being.  Home and relationship well-being.  Sexual activity.  Eating habits.  Work and work Statistician. Screening You may have the following tests or measurements:  Height, weight, and BMI.  Blood pressure.  Lipid and cholesterol levels. These may be checked every 5 years, or more frequently if you are over  55 years old.  Skin check.  Lung cancer screening. You may have this screening every year starting at age 46 if you have a 30-pack-year history of smoking and currently smoke or have quit within the past 15 years.  Colorectal cancer screening. All adults should have this screening starting at age 68 and continuing until age 45. Your health care provider may recommend screening at age 23. You will have tests every 1-10 years, depending on your results and the type of screening test. People at increased risk should start screening at an earlier age. Screening tests may include: ? Guaiac-based fecal occult blood testing. ? Fecal immunochemical test (FIT). ? Stool DNA test. ? Virtual colonoscopy. ? Sigmoidoscopy. During this test, a flexible tube with a tiny camera (sigmoidoscope) is used to examine your rectum and lower colon. The sigmoidoscope is inserted through your anus into your rectum and lower colon. ? Colonoscopy. During this test, a long, thin, flexible tube with a tiny camera (colonoscope) is used to examine your entire colon and rectum.  Prostate cancer screening. Recommendations will vary depending on your family history and other risks.  Hepatitis C blood test.  Hepatitis B blood test.  Sexually transmitted disease (STD) testing.  Diabetes screening. This is done by checking your blood sugar (glucose) after you have not eaten for a while (fasting). You may have this done every 1-3 years. Discuss your test results, treatment options, and if necessary, the need for more tests with your health care provider. Vaccines Your health care provider may recommend certain vaccines, such as:  Influenza vaccine.  This is recommended every year.  Tetanus, diphtheria, and acellular pertussis (Tdap, Td) vaccine. You may need a Td booster every 10 years.  Varicella vaccine. You may need this if you have not been vaccinated.  Zoster vaccine. You may need this after age 82.  Measles, mumps,  and rubella (MMR) vaccine. You may need at least one dose of MMR if you were born in 1957 or later. You may also need a second dose.  Pneumococcal 13-valent conjugate (PCV13) vaccine. You may need this if you have certain conditions and have not been vaccinated.  Pneumococcal polysaccharide (PPSV23) vaccine. You may need one or two doses if you smoke cigarettes or if you have certain conditions.  Meningococcal vaccine. You may need this if you have certain conditions.  Hepatitis A vaccine. You may need this if you have certain conditions or if you travel or work in places where you may be exposed to hepatitis A.  Hepatitis B vaccine. You may need this if you have certain conditions or if you travel or work in places where you may be exposed to hepatitis B.  Haemophilus influenzae type b (Hib) vaccine. You may need this if you have certain risk factors. Talk to your health care provider about which screenings and vaccines you need and how often you need them. This information is not intended to replace advice given to you by your health care provider. Make sure you discuss any questions you have with your health care provider. Document Released: 05/27/2015 Document Revised: 06/20/2017 Document Reviewed: 03/01/2015 Elsevier Interactive Patient Education  2019 [place] Prevention in the Home, Adult Falls can cause injuries. They can happen to people of all ages. There are many things you can do to make your home safe and to help prevent falls. Ask for help when making these changes, if needed. What actions can I take to prevent falls? General Instructions  Use good lighting in all rooms. Replace any light bulbs that burn out.  Turn on the lights when you go into a dark area. Use night-lights.  Keep items that you use often in easy-to-reach places. Lower the shelves around your home if necessary.  Set up your furniture so you have a clear path. Avoid moving your furniture  around.  Do not have throw rugs and other things on the floor that can make you trip.  Avoid walking on wet floors.  If any of your floors are uneven, fix them.  Add color or contrast paint or tape to clearly mark and help you see: ? Any grab bars or handrails. ? First and last steps of stairways. ? Where the edge of each step is.  If you use a stepladder: ? Make sure that it is fully opened. Do not climb a closed stepladder. ? Make sure that both sides of the stepladder are locked into place. ? Ask someone to hold the stepladder for you while you use it.  If there are any pets around you, be aware of where they are. What can I do in the bathroom?      Keep the floor dry. Clean up any water that spills onto the floor as soon as it happens.  Remove soap buildup in the tub or shower regularly.  Use non-skid mats or decals on the floor of the tub or shower.  Attach bath mats securely with double-sided, non-slip rug tape.  If you need to sit down in the shower, use a plastic, non-slip stool.  Install grab bars by the toilet and in the tub and shower. Do not use towel bars as grab bars. What can I do in the bedroom?  Make sure that you have a light by your bed that is easy to reach.  Do not use any sheets or blankets that are too big for your bed. They should not hang down onto the floor.  Have a firm chair that has side arms. You can use this for support while you get dressed. What can I do in the kitchen?  Clean up any spills right away.  If you need to reach something above you, use a strong step stool that has a grab bar.  Keep electrical cords out of the way.  Do not use floor polish or wax that makes floors slippery. If you must use wax, use non-skid floor wax. What can I do with my stairs?  Do not leave any items on the stairs.  Make sure that you have a light switch at the top of the stairs and the bottom of the stairs. If you do not have them, ask someone to  add them for you.  Make sure that there are handrails on both sides of the stairs, and use them. Fix handrails that are broken or loose. Make sure that handrails are as long as the stairways.  Install non-slip stair treads on all stairs in your home.  Avoid having throw rugs at the top or bottom of the stairs. If you do have throw rugs, attach them to the floor with carpet tape.  Choose a carpet that does not hide the edge of the steps on the stairway.  Check any carpeting to make sure that it is firmly attached to the stairs. Fix any carpet that is loose or worn. What can I do on the outside of my home?  Use bright outdoor lighting.  Regularly fix the edges of walkways and driveways and fix any cracks.  Remove anything that might make you trip as you walk through a door, such as a raised step or threshold.  Trim any bushes or trees on the path to your home.  Regularly check to see if handrails are loose or broken. Make sure that both sides of any steps have handrails.  Install guardrails along the edges of any raised decks and porches.  Clear walking paths of anything that might make someone trip, such as tools or rocks.  Have any leaves, snow, or ice cleared regularly.  Use sand or salt on walking paths during winter.  Clean up any spills in your garage right away. This includes grease or oil spills. What other actions can I take?  Wear shoes that: ? Have a low heel. Do not wear high heels. ? Have rubber bottoms. ? Are comfortable and fit you well. ? Are closed at the toe. Do not wear open-toe sandals.  Use tools that help you move around (mobility aids) if they are needed. These include: ? Canes. ? Walkers. ? Scooters. ? Crutches.  Review your medicines with your doctor. Some medicines can make you feel dizzy. This can increase your chance of falling. Ask your doctor what other things you can do to help prevent falls. Where to find more information  Centers for  Disease Control and Prevention, [facility]: https://garcia.biz/  [employer] on Aging: BrainJudge.co.uk Contact a doctor if:  You are afraid of falling at home.  You feel weak, drowsy, or dizzy at home.  You fall at home.  Summary  There are many simple things that you can do to make your home safe and to help prevent falls.  Ways to make your home safe include removing tripping hazards and installing grab bars in the bathroom.  Ask for help when making these changes in your home. This information is not intended to replace advice given to you by your health care provider. Make sure you discuss any questions you have with your health care provider. Document Released: 02/24/2009 Document Revised: 12/13/2016 Document Reviewed: 12/13/2016 Elsevier Interactive Patient Education  2019 [employer].

## 2018-08-08 ENCOUNTER — Ambulatory Visit: Payer: Medicare Other | Admitting: Internal Medicine

## 2018-08-13 DIAGNOSIS — L309 Dermatitis, unspecified: Secondary | ICD-10-CM | POA: Diagnosis not present

## 2018-09-03 ENCOUNTER — Ambulatory Visit (INDEPENDENT_AMBULATORY_CARE_PROVIDER_SITE_OTHER): Payer: Medicare Other | Admitting: Family Medicine

## 2018-09-03 ENCOUNTER — Encounter: Payer: Self-pay | Admitting: Family Medicine

## 2018-09-03 ENCOUNTER — Other Ambulatory Visit: Payer: Self-pay

## 2018-09-03 DIAGNOSIS — G479 Sleep disorder, unspecified: Secondary | ICD-10-CM | POA: Diagnosis not present

## 2018-09-03 DIAGNOSIS — L2084 Intrinsic (allergic) eczema: Secondary | ICD-10-CM | POA: Diagnosis not present

## 2018-09-03 MED ORDER — PREDNISONE 20 MG PO TABS: ORAL_TABLET | ORAL | 0 refills | Status: DC

## 2018-09-03 NOTE — Progress Notes (Signed)
    Subjective:    Patient ID: Frank Rosales, male    DOB: [birth year], 63 y.o.   MRN: [id number]   HPI: Frank Rosales is a 63 y.o. male presenting for rash all over. Can't sleep. Itches so bad,    Depression screen [facility] 2/9 07/18/2018 07/01/2018 06/02/2018 05/19/2018 05/02/2018  Decreased Interest 0 0 0 0 0  Down, Depressed, Hopeless 0 0 0 0 0  PHQ - 2 Score 0 0 0 0 0  Altered sleeping - - - - -  Tired, decreased energy - - - - -  Change in appetite - - - - -  Feeling bad or failure about yourself  - - - - -  Trouble concentrating - - - - -  Moving slowly or fidgety/restless - - - - -  Suicidal thoughts - - - - -  PHQ-9 Score - - - - -     Relevant past medical, surgical, family and social history reviewed and updated as indicated.  Interim medical history since our last visit reviewed. Allergies and medications reviewed and updated.  ROS:  Review of Systems   Social History   Tobacco Use  Smoking Status Former Smoker  . Packs/day: 1.50  . Years: 45.00  . Pack years: 67.50  . Types: Cigarettes  . Last attempt to quit: 11/11/2017  . Years since quitting: 0.8  Smokeless Tobacco Never Used  Tobacco Comment            Objective:     Wt Readings from Last 3 Encounters:  07/18/18 205 lb (93 kg)  07/01/18 205 lb (93 kg)  06/27/18 204 lb (92.5 kg)     Exam deferred. Pt. Harboring due to COVID 19. Phone visit performed.   Assessment & Plan:   1. Sleep disturbance   2. Intrinsic eczema     Meds ordered this encounter  Medications  . predniSONE (DELTASONE) 20 MG tablet    Sig: One twice a day for two weeks then one daily for two weeks    Dispense:  42 tablet    Refill:  0    No orders of the defined types were placed in this encounter.     Diagnoses and all orders for this visit:  Sleep disturbance  Intrinsic eczema  Other orders -     predniSONE (DELTASONE) 20 MG tablet; One twice a day for two weeks then one daily for two weeks    Virtual Visit  via telephone Note  I discussed the limitations, risks, security and privacy concerns of performing an evaluation and management service by telephone and the availability of in person appointments. The patient was identified with two identifiers. Pt.expressed understanding and agreed to proceed. Pt. Is at home. Dr. Livia Snellen is in his office.  Follow Up Instructions:   I discussed the assessment and treatment plan with the patient. The patient was provided an opportunity to ask questions and all were answered. The patient agreed with the plan and demonstrated an understanding of the instructions.   The patient was advised to call back or seek an in-person evaluation if the symptoms worsen or if the condition fails to improve as anticipated.  Visit started: 4:30 Call ended:  4:40 Total minutes including chart review and phone contact time: 15   Follow up plan: Return if symptoms worsen or fail to improve.  Frank Fraise, MD [place]

## 2018-10-16 ENCOUNTER — Other Ambulatory Visit: Payer: Self-pay | Admitting: Family Medicine

## 2018-10-23 ENCOUNTER — Other Ambulatory Visit (HOSPITAL_COMMUNITY): Payer: Medicare Other

## 2018-10-30 ENCOUNTER — Ambulatory Visit (HOSPITAL_COMMUNITY): Payer: Medicare Other | Admitting: Hematology

## 2018-11-13 ENCOUNTER — Other Ambulatory Visit: Payer: Self-pay

## 2018-11-13 ENCOUNTER — Encounter: Payer: Self-pay | Admitting: Internal Medicine

## 2018-11-13 ENCOUNTER — Ambulatory Visit (INDEPENDENT_AMBULATORY_CARE_PROVIDER_SITE_OTHER): Payer: Medicare Other | Admitting: Internal Medicine

## 2018-11-13 DIAGNOSIS — R05 Cough: Secondary | ICD-10-CM | POA: Diagnosis not present

## 2018-11-13 DIAGNOSIS — R058 Other specified cough: Secondary | ICD-10-CM

## 2018-11-13 DIAGNOSIS — J449 Chronic obstructive pulmonary disease, unspecified: Secondary | ICD-10-CM

## 2018-11-13 NOTE — Assessment & Plan Note (Addendum)
Quit smoking Spirometry 11/2017   03/2011 FeV1 49%    Spirometry  02/28/2012  FEV1 45%   - Last day worked = October 13 2014  - PFT's  01/24/2015  FEV1 1.59  (43 % ) ratio 54   p 22 % improvement from saba with DLCO  88 % corrects to 99 % for alv volume s symbicort  - PFTs  12/27/15 1.30 (35%) ratio 35  In midst of a flare- - PFT's  12/21/2016  FEV1 1.40 (39 % ) ratio 50  p 8 % improvement from saba p symbicort 160 x 2  prior to study with DLCO  71/69 % corrects to 85  % for alv volume - PFTS  05/16/17  FEV1  1.32( 37%)  Ratio 37  And DLCO  52% > corrects to 78%  03/25/2017  try bevespi > preferred symb 160  - 06/13/2018  After extensive coaching inhaler device,  effectiveness =    90% with smi > try adding spiriva 2.5 x 2 each am > no better so dc'd     Adequate control on present rx spiriva , reviewed in detail with pt > no change in rx needed    I advised the patient in detail regarding the importance of maintaining as much isolation as possible given the severity of his disease and the prevalence of the corona virus in our community.

## 2018-11-13 NOTE — Progress Notes (Signed)
Subjective:   Patient ID: Frank Rosales, male    DOB: 09-21-1955    MRN: [id number]    Brief patient profile:  62yowm "quit smoking"  11/2017  with h/o exposure to Asbestosis at [place] ? Into the 80s  still able to walk  flat fine but steps x 3 flights sob with GOLD III copd 2013 prev eval by Dr Joya Gaskins  / gets annual f/u in [place]     History of Present Illness  02/16/2016  f/u ov/Frank Rosales re:  GOLD III copd /  symbicort 160 2bid / twice weekly saba  Chief Complaint  Patient presents with  . Follow-up    Pt states had CT Chest done 12/27/15- ordered by worker's comp for eval of previous asbestos exp. He states he had been doing well until July 2017 developed cough and congestion that lasted 2 months, but starting to improve.   sick July - September 2017 rx with abx/ steroids better since early Sept 2017 but CT was done in middle of this illness and was abnormal but was done for purpose of screening for asbestos  rec Plan A = Automatic = symbicort 160 Take 2 puffs first thing in am and then another 2 puffs about 12 hours later.  Work on inhaler technique:   Only use your albuterol(proair)  as a rescue medication  The key is to stop smoking completely before smoking completely stops you!      11/07/2016  f/u ov/Frank Rosales re: acute cough - maintained on symb 160 2bid  Chief Complaint  Patient presents with  . Follow-up    Pt c/o chest and nasal congestion, cough, increased SOB for the past wk. His cough is prod with very thick, clear sputum. He states "feels like I'm drowning"-seen by PCP on 6/22 and given steroid inj and abx with no relief.   acute onset with sore thorat gone w/in first day but then nasal / congestion rx by Dr Quinn Axe on 11/02/16 with pred/levaquin Mucus is now clear  Using saba 2 4 x daily  rec Plan A = Automatic = Symbicort 160  Take 2 puffs first thing in am and then another 2 puffs about 12 hours later.  Plan B = Backup Only use your albuterol as a rescue  medication Prednisone 10 mg take  4 each am x 2 days,   2 each am x 2 days,  1 each am x 2 days and stop  Try prilosec otc 20mg   Take 30-60 min before first meal of the day and Pepcid ac (famotidine) 20 mg one @  bedtime until cough is completely gone for at least a week without the need for cough suppression and stop the krill oil for the same reason until no cough at all  For cough /congestion > mucinex dm up to 1200 mg every 12 hours as needed and tramadol 50 mg up to 2 every 4 hours  For nasal congestion;  advil cold and sinus congestion every 4 hours as needed    12/23/2017  f/u ov/Frank Rosales re:   COPD III/ stopped smoking  July 2019  Chief Complaint  Patient presents with  . Follow-up    He c/o non prod cough- esp worse in the am's. Breathing is about the same. He has completely stopped smoking. He is using his rescue inhaler about once per wk.   Dyspnea:  MMRC3 = can't walk 100 yards even at a slow pace at a flat grade s stopping due to sob  Cough: very harsh only with activity indoor vs outdoor the same/ uses mucinex dm ? Dose   Sleeping: ok p otc sleep aide on 2 pillows SABA use: rare 02: none   rec Stop fish oil when coughing   06/13/2018 acute extended  ov/Frank Rosales re:  Copd III/ still off cigs/ on pred for rash but down to 5 mg daily  Chief Complaint  Patient presents with  . Acute Visit    Pt states getting over bronchitis that he developed about a month. He has cough with minimal clear to somtimes green sputum.  He states he was txed with levaquin and a steroid injection. He rarely uses his rescue inhaler or neb.    Dyspnea:  Back to baseline  > back bothers him more than breathing most of the time  Cough: worse mid morning still green p levaquin Sleeping: on back / 2 pillows  SABA use:  2-3 x per month rec Add on spiriva 2.5 x 2 pff each am  Work on inhaler technique:   Augmentin 875 mg take one pill twice daily  X 10 days  > transiently better    06/27/2018  f/u ov/Frank Rosales re:  copd III/ not convinced better on spiriva vs off  Chief Complaint  Patient presents with  . Follow-up    Pt states he is about the same since last visit. Has complaints of cough with green phlegm and SOB with exertion.  Dyspnea:  MMRC3 = can't walk 100 yards even at a slow pace at a flat grade s stopping due to sob   Cough: worse in am  Assoc with nasal congestion  Sleeping: on back/ 2 pillows  SABA use: rarely  rec Prednisone 10 mg take  4 each am x 2 days,   2 each am x 2 days,  1 each am x 2 days and stop  Augmentin 875 mg take one pill twice daily  X 10 days - take at breakfast and supper with large glass of water.  It would help reduce the usual side effects (diarrhea and yeast infections) if you ate cultured yogurt at lunch.  Mucinex dm up to 1200  every 12 hours as needed  Adding pepcid 20 mg one at bedtime for 2 weeks to see if helps your am cough  schedule   Sinus CT 07/07/2018 >>> Clear sinuses.     Virtual Visit via Telephone Note 11/13/2018   I connected with [name] on 11/13/18 at 12:15  AM EDT by telephone and verified that I am speaking with the correct person using two identifiers.   I discussed the limitations, risks, security and privacy concerns of performing an evaluation and management service by telephone and the availability of in person appointments. I also discussed with the patient that there may be a patient responsible charge related to this service. The patient expressed understanding and agreed to proceed.   History of Present Illness: re GOLD III  Dyspnea:  Some better off spiriva / maint on symbicort 160 2bid and dupixent per dermatology Cough: am only but better on hs pepcid and off protonix   Sleeping: no noct symptoms on 3 pillows SABA use: rarely 02: none   No obvious day to day or daytime variability or assoc excess/ purulent sputum or mucus plugs or hemoptysis or cp or chest tightness, subjective wheeze or overt sinus or hb symptoms.     Also denies any obvious fluctuation of symptoms with weather or environmental changes or other  aggravating or alleviating factors except as outlined above.   Meds reviewed/ med reconciliation completed         Observations/Objective: slt nasal tone, speaking in full sentences    Assessment and Plan: See problem list for active a/p's   Follow Up Instructions: See avs for instructions unique to this ov which includes revised/ updated med list     I discussed the assessment and treatment plan with the patient. The patient was provided an opportunity to ask questions and all were answered. The patient agreed with the plan and demonstrated an understanding of the instructions.   The patient was advised to call back or seek an in-person evaluation if the symptoms worsen or if the condition fails to improve as anticipated.  I provided 25 minutes of non-face-to-face time during this encounter.   Christinia Gully, MD

## 2018-11-13 NOTE — Assessment & Plan Note (Signed)
Quit smoking Spirometry 11/2017   03/2011 FeV1 49%    Spirometry  02/28/2012  FEV1 45%   - Last day worked = October 13 2014  - PFT's  01/24/2015  FEV1 1.59  (43 % ) ratio 54   p 22 % improvement from saba with DLCO  88 % corrects to 99 % for alv volume s symbicort  - PFTs  12/27/15 1.30 (35%) ratio 35  In midst of a flare- - PFT's  12/21/2016  FEV1 1.40 (39 % ) ratio 50  p 8 % improvement from saba p symbicort 160 x 2  prior to study with DLCO  71/69 % corrects to 85  % for alv volume - PFTS  05/16/17  FEV1  1.32( 37%)  Ratio 37  And DLCO  52% > corrects to 78%  03/25/2017  try bevespi > preferred symb 160  - 06/13/2018  After extensive coaching inhaler device,  effectiveness =    90% with smi > try adding spiriva 2.5 x 2 each am      DDX of  difficult airways management almost all start with A and  include Adherence, Ace Inhibitors, Acid Reflux, Active Sinus Disease, Alpha 1 Antitripsin deficiency, Anxiety masquerading as Airways dz,  ABPA,  Allergy(esp in young), Aspiration (esp in elderly), Adverse effects of meds,  Active smoking or vaping, A bunch of PE's (a small clot burden can't cause this syndrome unless there is already severe underlying pulm or vascular dz with poor reserve) plus two Bs  = Bronchiectasis and Beta blocker use..and one C= CHF   Adherence is always the initial "prime suspect" and is a multilayered concern that requires a "trust but verify" approach in every patient - starting with knowing how to use medications, especially inhalers, correctly, keeping up with refills and understanding the fundamental difference between maintenance and prns vs those medications only taken for a very short course and then stopped and not refilled.  - return with all meds in hand using a trust but verify approach to confirm accurate Medication  Reconciliation The principal here is that until we are certain that the  patients are doing what we've asked, it makes no sense to ask them to do more.   ?  Active sinus dz > restart augmentin/ needs sinus CT to direct how long to continue abx/ whether to refer to ent at this point.  ? Allergy/asthma component > continue symb 160 2bid/ Prednisone 10 mg take  4 each am x 2 days,   2 each am x 2 days,  1 each am x 2 days and stop   ? Acid (or non-acid) GERD > always difficult to exclude as up to 75% of pts in some series report no assoc GI/ Heartburn symptoms> rec continue ppi q am ac and add pepcid hs to see if helps with am coughing    F/u  q 6 weeks until see convincing improvement

## 2018-11-13 NOTE — Assessment & Plan Note (Signed)
Rec h2 hs 06/27/18 - Sinus CT 07/07/2018 >>> Clear sinuses - improved as of 11/13/2018 though on dupixent per derm for rash   Cough is clearly better though it is difficult to know whether it is the Dupixent or the nocturnal acid suppression.  I recommended he continue the Pepcid at bedtime and use bed blocks for the residual cough that still bothers him in the morning.  He has stopped the PPI on his own which is fine with me as long as he does cough does not flare.  Each maintenance medication was reviewed in detail including most importantly the difference between maintenance and as needed and under what circumstances the prns are to be used.  Please see AVS for specific  Instructions which are unique to this visit and I personally typed out  which were reviewed in detail over the phone  with the patient and a copy provided via my chart  F/u 4 m

## 2018-11-13 NOTE — Patient Instructions (Signed)
No change in medications   Pt my chart  Please schedule a follow up visit in  4 months but call sooner if needed

## 2018-12-14 ENCOUNTER — Other Ambulatory Visit: Payer: Self-pay | Admitting: Family Medicine

## 2018-12-31 ENCOUNTER — Encounter: Payer: Self-pay | Admitting: Family Medicine

## 2018-12-31 ENCOUNTER — Ambulatory Visit (INDEPENDENT_AMBULATORY_CARE_PROVIDER_SITE_OTHER): Payer: Medicare Other | Admitting: Family Medicine

## 2018-12-31 DIAGNOSIS — F411 Generalized anxiety disorder: Secondary | ICD-10-CM | POA: Diagnosis not present

## 2018-12-31 DIAGNOSIS — J449 Chronic obstructive pulmonary disease, unspecified: Secondary | ICD-10-CM

## 2018-12-31 DIAGNOSIS — E039 Hypothyroidism, unspecified: Secondary | ICD-10-CM | POA: Diagnosis not present

## 2018-12-31 DIAGNOSIS — I10 Essential (primary) hypertension: Secondary | ICD-10-CM | POA: Diagnosis not present

## 2018-12-31 MED ORDER — NIFEDIPINE ER 60 MG PO TB24: 60.0000 mg | ORAL_TABLET | Freq: Every day | ORAL | 1 refills | Status: DC

## 2018-12-31 MED ORDER — ATORVASTATIN CALCIUM 40 MG PO TABS: 40.0000 mg | ORAL_TABLET | Freq: Every day | ORAL | 3 refills | Status: DC

## 2018-12-31 MED ORDER — OLMESARTAN MEDOXOMIL 40 MG PO TABS: 40.0000 mg | ORAL_TABLET | Freq: Every day | ORAL | 1 refills | Status: DC

## 2018-12-31 MED ORDER — LEVOTHYROXINE SODIUM 50 MCG PO TABS: ORAL_TABLET | ORAL | 1 refills | Status: DC

## 2018-12-31 MED ORDER — ALBUTEROL SULFATE HFA 108 (90 BASE) MCG/ACT IN AERS: 2.0000 | INHALATION_SPRAY | Freq: Four times a day (QID) | RESPIRATORY_TRACT | 5 refills | Status: DC

## 2018-12-31 MED ORDER — BUDESONIDE-FORMOTEROL FUMARATE 160-4.5 MCG/ACT IN AERO: INHALATION_SPRAY | RESPIRATORY_TRACT | 1 refills | Status: DC

## 2018-12-31 MED ORDER — ATORVASTATIN CALCIUM 40 MG PO TABS: 40.0000 mg | ORAL_TABLET | Freq: Every day | ORAL | 1 refills | Status: DC

## 2018-12-31 NOTE — Progress Notes (Signed)
Subjective:    Patient ID: Frank Rosales, male    DOB: 1955/11/14, 63 y.o.   MRN: [id number]   HPI: Frank Rosales is a 63 y.o. male presenting for COPD follow up. Gives out quickly. Can walk  A good ways, but stairs wear him out, dyspneic. Stops to catch his breath and then goes on.   presents for  follow-up of hypertension. Patient has no history of headache chest pain or shortness of breath or recent cough. Patient also denies symptoms of TIA such as focal numbness or weakness. Patient denies side effects from medication. States taking it regularly.  Pt. declined atorvastatin before, wants to discuss.Realizes he didn't get full information before.  Dupixent helping now for eczema. Working well.     Depression screen [facility] 2/9 07/18/2018 07/01/2018 06/02/2018 05/19/2018 05/02/2018  Decreased Interest 0 0 0 0 0  Down, Depressed, Hopeless 0 0 0 0 0  PHQ - 2 Score 0 0 0 0 0  Altered sleeping - - - - -  Tired, decreased energy - - - - -  Change in appetite - - - - -  Feeling bad or failure about yourself  - - - - -  Trouble concentrating - - - - -  Moving slowly or fidgety/restless - - - - -  Suicidal thoughts - - - - -  PHQ-9 Score - - - - -     Relevant past medical, surgical, family and social history reviewed and updated as indicated.  Interim medical history since our last visit reviewed. Allergies and medications reviewed and updated.  ROS:  Review of Systems  Constitutional: Negative.   HENT: Negative.   Eyes: Negative for visual disturbance.  Respiratory: Positive for shortness of breath. Negative for cough.   Cardiovascular: Negative for chest pain and leg swelling.  Gastrointestinal: Negative for abdominal pain, diarrhea, nausea and vomiting.  Genitourinary: Negative for difficulty urinating.  Musculoskeletal: Positive for arthralgias. Negative for myalgias.  Skin: Positive for rash (eczema).  Neurological: Negative for headaches.  Psychiatric/Behavioral:  Negative for sleep disturbance.     Social History   Tobacco Use  Smoking Status Former Smoker  . Packs/day: 1.50  . Years: 45.00  . Pack years: 67.50  . Types: Cigarettes  . Quit date: 11/11/2017  . Years since quitting: 1.1  Smokeless Tobacco Never Used  Tobacco Comment            Objective:     Wt Readings from Last 3 Encounters:  07/18/18 205 lb (93 kg)  07/01/18 205 lb (93 kg)  06/27/18 204 lb (92.5 kg)     Exam deferred. Pt. Harboring due to COVID 19. Phone visit performed.   Assessment & Plan:   1. COPD GOLD III    2. Essential hypertension   3. GAD (generalized anxiety disorder)   4. Hypothyroidism, unspecified type     Meds ordered this encounter  Medications  . DISCONTD: atorvastatin (LIPITOR) 40 MG tablet    Sig: Take 1 tablet (40 mg total) by mouth daily. For cholesterol    Dispense:  90 tablet    Refill:  3  . NIFEdipine (ADALAT CC) 60 MG 24 hr tablet    Sig: Take 1 tablet (60 mg total) by mouth daily.    Dispense:  90 tablet    Refill:  1  . levothyroxine (SYNTHROID) 50 MCG tablet    Sig: 1 TABLET EVERY MORNING BEFORE BREAKFAST-WAIT 1 HR AFTER TAKING BEFORE EATING OR DRINKING  Dispense:  90 tablet    Refill:  1  . budesonide-formoterol (SYMBICORT) 160-4.5 MCG/ACT inhaler    Sig: TAKE 2 PUFFS BY MOUTH TWICE A DAY    Dispense:  30.6 Inhaler    Refill:  1  . albuterol (PROAIR HFA) 108 (90 Base) MCG/ACT inhaler    Sig: Inhale 2 puffs into the lungs every 6 (six) hours.    Dispense:  1 g    Refill:  5  . olmesartan (BENICAR) 40 MG tablet    Sig: Take 1 tablet (40 mg total) by mouth daily. For blood pressure    Dispense:  90 tablet    Refill:  1  . atorvastatin (LIPITOR) 40 MG tablet    Sig: Take 1 tablet (40 mg total) by mouth daily. For cholesterol    Dispense:  90 tablet    Refill:  1        Diagnoses and all orders for this visit:  COPD GOLD III  -     budesonide-formoterol (SYMBICORT) 160-4.5 MCG/ACT inhaler; TAKE 2 PUFFS BY  MOUTH TWICE A DAY -     albuterol (PROAIR HFA) 108 (90 Base) MCG/ACT inhaler; Inhale 2 puffs into the lungs every 6 (six) hours.  Essential hypertension  GAD (generalized anxiety disorder)  Hypothyroidism, unspecified type  Other orders -     Discontinue: atorvastatin (LIPITOR) 40 MG tablet; Take 1 tablet (40 mg total) by mouth daily. For cholesterol -     NIFEdipine (ADALAT CC) 60 MG 24 hr tablet; Take 1 tablet (60 mg total) by mouth daily. -     levothyroxine (SYNTHROID) 50 MCG tablet; 1 TABLET EVERY MORNING BEFORE BREAKFAST-WAIT 1 HR AFTER TAKING BEFORE EATING OR DRINKING -     olmesartan (BENICAR) 40 MG tablet; Take 1 tablet (40 mg total) by mouth daily. For blood pressure -     atorvastatin (LIPITOR) 40 MG tablet; Take 1 tablet (40 mg total) by mouth daily. For cholesterol    Virtual Visit via telephone Note  I discussed the limitations, risks, security and privacy concerns of performing an evaluation and management service by telephone and the availability of in person appointments. The patient was identified with two identifiers. Pt.expressed understanding and agreed to proceed. Pt. Is at home. Dr. Livia Snellen is in his office.  Follow Up Instructions:   I discussed the assessment and treatment plan with the patient. The patient was provided an opportunity to ask questions and all were answered. The patient agreed with the plan and demonstrated an understanding of the instructions.   The patient was advised to call back or seek an in-person evaluation if the symptoms worsen or if the condition fails to improve as anticipated.   Total minutes including chart review and phone contact time: 22   Follow up plan: Return in about 6 months (around 07/03/2019).  Frank Fraise, MD [place]

## 2019-03-10 ENCOUNTER — Ambulatory Visit (INDEPENDENT_AMBULATORY_CARE_PROVIDER_SITE_OTHER): Payer: Medicare Other

## 2019-03-10 ENCOUNTER — Other Ambulatory Visit: Payer: Self-pay

## 2019-03-10 DIAGNOSIS — Z23 Encounter for immunization: Secondary | ICD-10-CM

## 2019-03-31 ENCOUNTER — Other Ambulatory Visit: Payer: Self-pay

## 2019-03-31 ENCOUNTER — Ambulatory Visit: Payer: Medicare Other | Admitting: Internal Medicine

## 2019-03-31 ENCOUNTER — Encounter: Payer: Self-pay | Admitting: Internal Medicine

## 2019-03-31 DIAGNOSIS — J449 Chronic obstructive pulmonary disease, unspecified: Secondary | ICD-10-CM | POA: Diagnosis not present

## 2019-03-31 DIAGNOSIS — L309 Dermatitis, unspecified: Secondary | ICD-10-CM | POA: Diagnosis not present

## 2019-03-31 MED ORDER — BREZTRI AEROSPHERE 160-9-4.8 MCG/ACT IN AERO: 2.0000 | INHALATION_SPRAY | Freq: Two times a day (BID) | RESPIRATORY_TRACT | 11 refills | Status: DC

## 2019-03-31 NOTE — Patient Instructions (Addendum)
Plan A = Automatic = Always=    symbicort 160 (or Breztri not both) Take 2 puffs first thing in am and then another 2 puffs about 12 hours later.     Plan B = Backup (to supplement plan A, not to replace it) Only use your albuterol inhaler as a rescue medication to be used if you can't catch your breath by resting or doing a relaxed purse lip breathing pattern.  - The less you use it, the better it will work when you need it. - Ok to use the inhaler up to 2 puffs  every 4 hours if you must but call for appointment if use goes up over your usual need - Don't leave home without it !!  (think of it like the spare tire for your car)    Plan C = Crisis (instead of Plan B but only if Plan B stops working) - only use your albuterol nebulizer if you first try Plan B and it fails to help > ok to use the nebulizer up to every 4 hours but if start needing it regularly call for immediate appointment    Please schedule a follow up visit in 6  months but call sooner if needed

## 2019-03-31 NOTE — Progress Notes (Signed)
Subjective:   Patient ID: Frank Rosales, male    DOB: 12-17-1955    MRN: [id number]    Brief patient profile:  15  yowm "quit smoking"  11/2017  with h/o exposure to Asbestosis at [place] ? Into the 80s  still able to walk  flat fine but steps x 3 flights sob with GOLD III copd 2013 prev eval by Dr Joya Gaskins  / gets annual f/u in [place]     History of Present Illness  02/16/2016  f/u ov/Frank Rosales re:  GOLD III copd /  symbicort 160 2bid / twice weekly saba  Chief Complaint  Patient presents with  . Follow-up    Pt states had CT Chest done 12/27/15- ordered by worker's comp for eval of previous asbestos exp. He states he had been doing well until July 2017 developed cough and congestion that lasted 2 months, but starting to improve.   sick July - September 2017 rx with abx/ steroids better since early Sept 2017 but CT was done in middle of this illness and was abnormal but was done for purpose of screening for asbestos  rec Plan A = Automatic = symbicort 160 Take 2 puffs first thing in am and then another 2 puffs about 12 hours later.  Work on inhaler technique:   Only use your albuterol(proair)  as a rescue medication  The key is to stop smoking completely before smoking completely stops you!      11/07/2016  f/u ov/[name] re: acute cough - maintained on symb 160 2bid  Chief Complaint  Patient presents with  . Follow-up    Pt c/o chest and nasal congestion, cough, increased SOB for the past wk. His cough is prod with very thick, clear sputum. He states "feels like I'm drowning"-seen by PCP on 6/22 and given steroid inj and abx with no relief.   acute onset with sore thorat gone w/in first day but then nasal / congestion rx by Dr Quinn Axe on 11/02/16 with pred/levaquin Mucus is now clear  Using saba 2 4 x daily  rec Plan A = Automatic = Symbicort 160  Take 2 puffs first thing in am and then another 2 puffs about 12 hours later.  Plan B = Backup Only use your albuterol as a rescue  medication Prednisone 10 mg take  4 each am x 2 days,   2 each am x 2 days,  1 each am x 2 days and stop  Try prilosec otc 20mg   Take 30-60 min before first meal of the day and Pepcid ac (famotidine) 20 mg one @  bedtime until cough is completely gone for at least a week without the need for cough suppression and stop the krill oil for the same reason until no cough at all  For cough /congestion > mucinex dm up to 1200 mg every 12 hours as needed and tramadol 50 mg up to 2 every 4 hours  For nasal congestion;  advil cold and sinus congestion every 4 hours as needed    12/23/2017  f/u ov/Frank Rosales re:   COPD III/ stopped smoking  July 2019  Chief Complaint  Patient presents with  . Follow-up    He c/o non prod cough- esp worse in the am's. Breathing is about the same. He has completely stopped smoking. He is using his rescue inhaler about once per wk.   Dyspnea:  MMRC3 = can't walk 100 yards even at a slow pace at a flat grade s stopping due  to sob   Cough: very harsh only with activity indoor vs outdoor the same/ uses mucinex dm ? Dose   Sleeping: ok p otc sleep aide on 2 pillows SABA use: rare 02: none   rec Stop fish oil when coughing   06/13/2018 acute extended  ov/Frank Rosales re:  Copd III/ still off cigs/ on pred for rash but down to 5 mg daily  Chief Complaint  Patient presents with  . Acute Visit    Pt states getting over bronchitis that he developed about a month. He has cough with minimal clear to somtimes green sputum.  He states he was txed with levaquin and a steroid injection. He rarely uses his rescue inhaler or neb.    Dyspnea:  Back to baseline  > back bothers him more than breathing most of the time  Cough: worse mid morning still green p levaquin Sleeping: on back / 2 pillows  SABA use:  2-3 x per month rec Add on spiriva 2.5 x 2 pff each am  Work on inhaler technique:  Augmentin 875 mg take one pill twice daily  X 10 days  > transiently better    06/27/2018  f/u ov/Frank Rosales re:  copd III/ not convinced better on spiriva vs off  Chief Complaint  Patient presents with  . Follow-up    Pt states he is about the same since last visit. Has complaints of cough with green phlegm and SOB with exertion.  Dyspnea:  MMRC3 = can't walk 100 yards even at a slow pace at a flat grade s stopping due to sob   Cough: worse in am  Assoc with nasal congestion  Sleeping: on back/ 2 pillows  SABA use: rarely  rec Prednisone 10 mg take  4 each am x 2 days,   2 each am x 2 days,  1 each am x 2 days and stop  Augmentin 875 mg take one pill twice daily  X 10 days - take at breakfast and supper with large glass of water.  It would help reduce the usual side effects (diarrhea and yeast infections) if you ate cultured yogurt at lunch.  Mucinex dm up to 1200  every 12 hours as needed  Adding pepcid 20 mg one at bedtime for 2 weeks to see if helps your am cough   Please schedule a follow up office visit in 6 weeks, call sooner if needed with all medications /inhalers/ solutions in hand so we can verify exactly what you are taking. This includes all medications from all doctors and over the counters    televist 11/13/18 no change recs    03/31/2019  f/u ov/Frank Rosales re:  GOLD III  No better on spiriva  Chief Complaint  Patient presents with  . Follow-up    breathing is "not too bad"- using proair and neb both rarely.   Dyspnea:  Only time he leaves the house is to go to doctor/ one flight and stops at top Cough: better, non-productive  Sleeping: on back or side with bed 2 pillows  SABA use: rarely  02: none    No obvious day to day or daytime variability or assoc excess/ purulent sputum or mucus plugs or hemoptysis or cp or chest tightness, subjective wheeze or overt sinus or hb symptoms.   Sleep  without nocturnal  or early am exacerbation  of respiratory  c/o's or need for noct saba. Also denies any obvious fluctuation of symptoms with weather or environmental changes or other aggravating  or  alleviating factors except as outlined above   No unusual exposure hx or h/o childhood pna/ asthma or knowledge of premature birth.  Current Allergies, Complete Past Medical History, Past Surgical History, Family History, and Social History were reviewed in [employer] record.  ROS  The following are not active complaints unless bolded Hoarseness, sore throat, dysphagia, dental problems, itching, sneezing,  nasal congestion or discharge of excess mucus or purulent secretions, ear ache,   fever, chills, sweats, unintended wt loss or wt gain, classically pleuritic or exertional cp,  orthopnea pnd or arm/hand swelling  or leg swelling, presyncope, palpitations, abdominal pain, anorexia, nausea, vomiting, diarrhea  or change in bowel habits or change in bladder habits, change in stools or change in urine, dysuria, hematuria,  Rash improved, arthralgias, visual complaints, headache, numbness, weakness or ataxia or problems with walking or coordination,  change in mood or  memory.        Current Meds  Medication Sig  . acetaminophen (TYLENOL) 325 MG tablet Take 2 tablets (650 mg total) by mouth every 6 (six) hours as needed.  Marland Kitchen albuterol (PROAIR HFA) 108 (90 Base) MCG/ACT inhaler Inhale 2 puffs into the lungs every 6 (six) hours.  Marland Kitchen albuterol (PROVENTIL) (2.5 MG/3ML) 0.083% nebulizer solution Take 3 mLs (2.5 mg total) by nebulization every 6 (six) hours as needed for wheezing or shortness of breath.  Marland Kitchen aspirin 81 MG tablet Take 81 mg by mouth daily.   Marland Kitchen atorvastatin (LIPITOR) 40 MG tablet Take 1 tablet (40 mg total) by mouth daily. For cholesterol  . budesonide-formoterol (SYMBICORT) 160-4.5 MCG/ACT inhaler TAKE 2 PUFFS BY MOUTH TWICE A DAY  . cholecalciferol (VITAMIN D) 1000 UNITS tablet Take 1,000 Units by mouth daily.   Marland Kitchen dextromethorphan-guaiFENesin (MUCINEX DM) 30-600 MG 12hr tablet Take 1 tablet by mouth 2 (two) times daily as needed for cough.  . Dupilumab (DUPIXENT) 300  MG/2ML SOPN Inject 1 Dose into the skin every 14 (fourteen) days.  . Famotidine (PEPCID PO) Take 1 tablet by mouth daily.  . folic acid (FOLVITE) 1 MG tablet Take 1 tablet (1 mg total) by mouth daily.  Javier Docker Oil 1000 MG CAPS Take by mouth.  . levothyroxine (SYNTHROID) 50 MCG tablet 1 TABLET EVERY MORNING BEFORE BREAKFAST-WAIT 1 HR AFTER TAKING BEFORE EATING OR DRINKING  . NIFEdipine (ADALAT CC) 60 MG 24 hr tablet Take 1 tablet (60 mg total) by mouth daily.  Marland Kitchen olmesartan (BENICAR) 40 MG tablet Take 1 tablet (40 mg total) by mouth daily. For blood pressure  . triamcinolone cream (KENALOG) 0.1 %   . vitamin B-12 (CYANOCOBALAMIN) 250 MCG tablet Take 250 mcg by mouth daily.                        Objective:   Physical Exam   hoarse amb wm nad/ min rattling on fvc/ cough maneuver     03/31/2019  213  01/24/2015        180 > 02/21/2015  180  > 06/16/2015  186  > 02/16/2016  205 > 09/13/2016   185 >  11/07/2016  187 >  12/21/2016  195 > 03/25/2017   200 > 06/25/2017   198  > 12/23/2017   199  > 06/13/2018  200      11/10/14 174 lb (78.926 kg)  11/08/14 176 lb 6.4 oz (80.015 kg)  10/27/14 175 lb (79.379 kg)      BP 134/76 (BP Location: Left  Arm, Cuff Size: Normal)   Pulse (!) 105   Temp 97.8 F (36.6 C) (Temporal)   Ht 5\' 9"  (1.753 m)   Wt 213 lb (96.6 kg)   SpO2 94% Comment: on RA  BMI 31.45 kg/m      HEENT : pt wearing mask not removed for exam due to covid -19 concerns.    NECK :  without JVD/Nodes/TM/ nl carotid upstrokes bilaterally   LUNGS: no acc muscle use,  Mod barrel  contour chest wall with bilateral  Distant bs s audible wheeze and  without cough on insp or exp maneuvers and mod  Hyperresonant  to  percussion bilaterally     CV:  RRR  no s3 or murmur or increase in P2, and no edema   ABD:  soft and nontender with pos mid insp Hoover's  in the supine position. No bruits or organomegaly appreciated, bowel sounds nl  MS:     ext warm without deformities, calf  tenderness, cyanosis or clubbing No obvious joint restrictions   SKIN: warm and dry without lesions    NEURO:  alert, approp, nl sensorium with  no motor or cerebellar deficits apparent.                 Assessment & Plan:

## 2019-04-01 ENCOUNTER — Encounter: Payer: Self-pay | Admitting: Internal Medicine

## 2019-04-01 NOTE — Assessment & Plan Note (Signed)
Taffeen eval and rx Dupixent 06/2018>>> improved as of 03/31/2019 though no apparent effect on copd/ab component.

## 2019-04-01 NOTE — Assessment & Plan Note (Addendum)
Quit smoking Spirometry 11/2017   03/2011 FeV1 49%    Spirometry  02/28/2012  FEV1 45%   - Last day worked = October 13 2014  - PFT's  01/24/2015  FEV1 1.59  (43 % ) ratio 54   p 22 % improvement from saba with DLCO  88 % corrects to 99 % for alv volume s symbicort  - PFTs  12/27/15 1.30 (35%) ratio 35  In midst of a flare- - PFT's  12/21/2016  FEV1 1.40 (39 % ) ratio 50  p 8 % improvement from saba p symbicort 160 x 2  prior to study with DLCO  71/69 % corrects to 85  % for alv volume - PFTS  05/16/17  FEV1  1.32( 37%)  Ratio 37  And DLCO  52% > corrects to 78%  03/25/2017  try bevespi > preferred symb 160  - 06/13/2018    try adding spiriva 2.5 x 2 each am sample  > no better so did not fill rx  - 03/31/2019   Walked RA x one lap =  approx 250 ft @ mod pace - stopped due to sob with sats of 92% at the end of the study.   Group D in terms of symptom/risk and laba/lama/ICS  therefore appropriate rx at this point >>>  Breztri preferred if insurance covers, if not resume symbicort  Advised:  formulary restrictions will be an ongoing challenge for the forseable future and I would be happy to pick an alternative if the pt will first  provide me a list of them -  pt  will need to return here for training for any new device that is required eg dpi vs hfa vs respimat.    In the meantime we can always provide samples so that the patient never runs out of any needed respiratory medications.   Pt informed of the seriousness of COVID 19 infection as a direct risk to their health  and safey and to those of their loved ones and should continue to wear facemask in public and minimize exposure to public locations but especially avoid any area or activity where non-close contacts are not observing distancing or wearing an appropriate face mask.    I had an extended discussion with the patient reviewing all relevant studies completed to date and  lasting 15 to 20 minutes of a 25 minute visit  which included directly  observing ambulatory 02 saturation study documented in a/p section of  today's  office note.  Each maintenance medication was reviewed in detail including most importantly the difference between maintenance and prns and under what circumstances the prns are to be triggered using an action plan format that is not reflected in the computer generated alphabetically organized AVS.     Please see AVS for specific instructions unique to this visit that I personally wrote and verbalized to the the pt in detail and then reviewed with pt  by my nurse highlighting any changes in therapy recommended at today's visit .

## 2019-04-13 ENCOUNTER — Other Ambulatory Visit: Payer: Self-pay

## 2019-04-13 NOTE — Patient Outreach (Signed)
[facility]) Care Management  04/13/2019  Frank Rosales 1956-02-18 [id number]   Medication Adherence call to [name] [facility] Ident[employer] Verify spoke with patient he is past due on Atorvastatin 40 mg,patient explain he started taking this medication two weeks later,patient explain he was out of town and forgot to take his medication with him,but know he is taking it on a regular basis,Frank Rosales is showing past due under [place].   [facility] Direct Dial [phone]  Fax [phone] [email or web address]

## 2019-05-04 DIAGNOSIS — Z029 Encounter for administrative examinations, unspecified: Secondary | ICD-10-CM

## 2019-05-18 ENCOUNTER — Telehealth: Payer: Self-pay | Admitting: Family Medicine

## 2019-05-18 NOTE — Telephone Encounter (Signed)
Pt wants to speak with Frank Rosales regarding his disability paperwork. Says its been a few weeks and he hasn't heard or received anything back. Says he needs the paperwork soon.

## 2019-05-18 NOTE — Telephone Encounter (Addendum)
Pt aware ppw is on Dr. Livia Snellen desk for when he returns from vacation Will fax for pt, then call him to pick up originals

## 2019-05-20 NOTE — Telephone Encounter (Signed)
Pt aware ppw faxed & ready for pu

## 2019-07-03 ENCOUNTER — Other Ambulatory Visit: Payer: Self-pay

## 2019-07-06 ENCOUNTER — Other Ambulatory Visit: Payer: Self-pay

## 2019-07-06 ENCOUNTER — Ambulatory Visit (INDEPENDENT_AMBULATORY_CARE_PROVIDER_SITE_OTHER): Payer: Medicare Other | Admitting: Family Medicine

## 2019-07-06 ENCOUNTER — Encounter: Payer: Self-pay | Admitting: Family Medicine

## 2019-07-06 VITALS — BP 140/89 | HR 100 | Temp 98.7°F | Ht 69.0 in | Wt 194.0 lb

## 2019-07-06 DIAGNOSIS — J449 Chronic obstructive pulmonary disease, unspecified: Secondary | ICD-10-CM

## 2019-07-06 DIAGNOSIS — E039 Hypothyroidism, unspecified: Secondary | ICD-10-CM | POA: Diagnosis not present

## 2019-07-06 DIAGNOSIS — F411 Generalized anxiety disorder: Secondary | ICD-10-CM | POA: Diagnosis not present

## 2019-07-06 DIAGNOSIS — E7800 Pure hypercholesterolemia, unspecified: Secondary | ICD-10-CM

## 2019-07-06 DIAGNOSIS — Z125 Encounter for screening for malignant neoplasm of prostate: Secondary | ICD-10-CM

## 2019-07-06 DIAGNOSIS — D7589 Other specified diseases of blood and blood-forming organs: Secondary | ICD-10-CM

## 2019-07-06 DIAGNOSIS — I10 Essential (primary) hypertension: Secondary | ICD-10-CM

## 2019-07-06 MED ORDER — ALBUTEROL SULFATE HFA 108 (90 BASE) MCG/ACT IN AERS: 2.0000 | INHALATION_SPRAY | Freq: Four times a day (QID) | RESPIRATORY_TRACT | 5 refills | Status: DC

## 2019-07-06 MED ORDER — LEVOTHYROXINE SODIUM 50 MCG PO TABS: ORAL_TABLET | ORAL | 1 refills | Status: DC

## 2019-07-06 MED ORDER — ATORVASTATIN CALCIUM 40 MG PO TABS: 40.0000 mg | ORAL_TABLET | Freq: Every day | ORAL | 1 refills | Status: DC

## 2019-07-06 MED ORDER — BUDESONIDE-FORMOTEROL FUMARATE 160-4.5 MCG/ACT IN AERO: INHALATION_SPRAY | RESPIRATORY_TRACT | 1 refills | Status: DC

## 2019-07-06 MED ORDER — NIFEDIPINE ER 60 MG PO TB24: 60.0000 mg | ORAL_TABLET | Freq: Every day | ORAL | 1 refills | Status: DC

## 2019-07-06 MED ORDER — OLMESARTAN MEDOXOMIL 40 MG PO TABS: 40.0000 mg | ORAL_TABLET | Freq: Every day | ORAL | 1 refills | Status: DC

## 2019-07-06 NOTE — Progress Notes (Signed)
Subjective:  Patient ID: Frank Rosales, male    DOB: 07-Apr-1956  Age: 64 y.o. MRN: [id number]  CC: Follow-up   HPI Frank Rosales presents for  follow-up of hypertension. Patient has no history of headache chest pain or shortness of breath or recent cough. Patient also denies symptoms of TIA such as focal numbness or weakness. Patient denies side effects from medication. States taking it regularly.  Patient presents for follow-up on  thyroid. The patient has a history of hypothyroidism for many years. It has been stable recently. Pt. denies any change in  voice, loss of hair, heat or cold intolerance. Energy level has been adequate to good. Patient denies constipation and diarrhea. No myxedema. Medication is as noted below. Verified that pt is taking it daily on an empty stomach. Well tolerated.  Patient followed for COPD.  He says that when he tries to go upstairs he has some dyspnea on exertion.  He can ambulate adequately on a reasonably level surface without difficulty.  He recently saw Dr. Shyrl Numbers his pulmonologist who says that he is doing well.  Patient recently was started on cholesterol medication.  He has been taking atorvastatin for 3 months now.  He has had no signs or symptoms of TIA or chest pain.  He has had a few body aches but nothing significant.  Nothing that he would refer back to the use of the atorvastatin.   History Frank Rosales has a past medical history of Anxiety, Arthritis, Asthma, Atypical nevus (03/27/2011), Atypical nevus (01/20/2007), Colon polyps, COPD (chronic obstructive pulmonary disease) ([place]), High blood pressure, High cholesterol, Hilar adenopathy, Hypothyroidism, Multiple rib fractures (08/2016), Squamous cell carcinoma in situ (SCCIS) (01/09/2016), and Superficial nodular basal cell carcinoma (BCC) (01/09/2016).   He has a past surgical history that includes Knee arthroscopy (Right, 05/14/2010); Wisdom tooth extraction; epidural injections; Fixation kyphoplasty  lumbar spine; Back surgery; Video assisted thoracoscopy (Left, 08/22/2016); Pleural effusion drainage (Left, 08/22/2016); and Rib plating (Left, 08/22/2016).   His family history includes Diabetes in his mother; Emphysema in his father; Heart disease in his brother; Heart failure in his mother; Lung cancer in his father.He reports that he quit smoking about 19 months ago. His smoking use included cigarettes. He has a 67.50 pack-year smoking history. He has never used smokeless tobacco. He reports current alcohol use of about 42.0 standard drinks of alcohol per week. He reports that he does not use drugs.  Current Outpatient Medications on File Prior to Visit  Medication Sig Dispense Refill  . acetaminophen (TYLENOL) 325 MG tablet Take 2 tablets (650 mg total) by mouth every 6 (six) hours as needed.    Marland Kitchen albuterol (PROVENTIL) (2.5 MG/3ML) 0.083% nebulizer solution Take 3 mLs (2.5 mg total) by nebulization every 6 (six) hours as needed for wheezing or shortness of breath. 240 mL 5  . Ascorbic Acid (VITAMIN C) 100 MG tablet Take 100 mg by mouth daily.    Marland Kitchen aspirin 81 MG tablet Take 81 mg by mouth daily.     . cholecalciferol (VITAMIN D) 1000 UNITS tablet Take 1,000 Units by mouth daily.     Marland Kitchen dextromethorphan-guaiFENesin (MUCINEX DM) 30-600 MG 12hr tablet Take 1 tablet by mouth 2 (two) times daily as needed for cough.    . Dupilumab (DUPIXENT) 300 MG/2ML SOPN Inject 1 Dose into the skin every 14 (fourteen) days.    . Famotidine (PEPCID PO) Take 1 tablet by mouth daily.    . folic acid (FOLVITE) 1 MG tablet Take  1 tablet (1 mg total) by mouth daily. 90 tablet 3  . triamcinolone cream (KENALOG) 0.1 %     . vitamin B-12 (CYANOCOBALAMIN) 250 MCG tablet Take 250 mcg by mouth daily.    . Budeson-Glycopyrrol-Formoterol (BREZTRI AEROSPHERE) 160-9-4.8 MCG/ACT AERO Inhale 2 puffs into the lungs 2 (two) times daily. (Patient not taking: Reported on 07/06/2019) 10.7 g 11   No current facility-administered  medications on file prior to visit.    ROS Review of Systems  Constitutional: Negative for fever.  Respiratory: Positive for shortness of breath.   Cardiovascular: Negative for chest pain.  Musculoskeletal: Negative for arthralgias.  Skin: Negative for rash.    Objective:  BP 140/89   Pulse 100   Temp 98.7 F (37.1 C) (Temporal)   Ht 5' 9"  (1.753 m)   Wt 194 lb (88 kg)   BMI 28.65 kg/m   BP Readings from Last 3 Encounters:  07/06/19 140/89  03/31/19 134/76  07/18/18 (!) 143/93    Wt Readings from Last 3 Encounters:  07/06/19 194 lb (88 kg)  03/31/19 213 lb (96.6 kg)  07/18/18 205 lb (93 kg)     Physical Exam Constitutional:      General: He is not in acute distress.    Appearance: He is well-developed.  HENT:     Head: Normocephalic and atraumatic.     Right Ear: External ear normal.     Left Ear: External ear normal.     Nose: Nose normal.  Eyes:     Conjunctiva/sclera: Conjunctivae normal.     Pupils: Pupils are equal, round, and reactive to light.  Cardiovascular:     Rate and Rhythm: Normal rate and regular rhythm.     Heart sounds: Normal heart sounds. No murmur.  Pulmonary:     Effort: Pulmonary effort is normal. No respiratory distress.     Breath sounds: Wheezing and rhonchi present. No rales.  Abdominal:     Palpations: Abdomen is soft.     Tenderness: There is no abdominal tenderness.  Musculoskeletal:        General: Normal range of motion.     Cervical back: Normal range of motion and neck supple.  Skin:    General: Skin is warm and dry.  Neurological:     Mental Status: He is alert and oriented to person, place, and time.     Deep Tendon Reflexes: Reflexes are normal and symmetric.  Psychiatric:        Behavior: Behavior normal.        Thought Content: Thought content normal.        Judgment: Judgment normal.       Assessment & Plan:   Frank Rosales was seen today for follow-up.  Diagnoses and all orders for this  visit:  Hypothyroidism, unspecified type -     levothyroxine (SYNTHROID) 50 MCG tablet; 1 TABLET EVERY MORNING BEFORE BREAKFAST-WAIT 1 HR AFTER TAKING BEFORE EATING OR DRINKING -     CBC with Differential/Platelet -     CMP14+EGFR -     TSH + free T4  COPD GOLD III  -     budesonide-formoterol (SYMBICORT) 160-4.5 MCG/ACT inhaler; TAKE 2 PUFFS BY MOUTH TWICE A DAY -     albuterol (PROAIR HFA) 108 (90 Base) MCG/ACT inhaler; Inhale 2 puffs into the lungs every 6 (six) hours. -     CBC with Differential/Platelet -     CMP14+EGFR  Macrocytosis -     CBC with Differential/Platelet -  CMP14+EGFR  GAD (generalized anxiety disorder) -     CBC with Differential/Platelet -     CMP14+EGFR  Essential hypertension -     olmesartan (BENICAR) 40 MG tablet; Take 1 tablet (40 mg total) by mouth daily. For blood pressure -     NIFEdipine (ADALAT CC) 60 MG 24 hr tablet; Take 1 tablet (60 mg total) by mouth daily. -     CBC with Differential/Platelet -     CMP14+EGFR  High cholesterol -     atorvastatin (LIPITOR) 40 MG tablet; Take 1 tablet (40 mg total) by mouth daily. For cholesterol -     CBC with Differential/Platelet -     CMP14+EGFR -     Lipid panel  Screening for prostate cancer -     PSA Total (Reflex To Free)   Allergies as of 07/06/2019      Reactions   No Known Allergies       Medication List       Accurate as of July 06, 2019  9:00 PM. If you have any questions, ask your nurse or doctor.        acetaminophen 325 MG tablet Commonly known as: TYLENOL Take 2 tablets (650 mg total) by mouth every 6 (six) hours as needed.   albuterol (2.5 MG/3ML) 0.083% nebulizer solution Commonly known as: PROVENTIL Take 3 mLs (2.5 mg total) by nebulization every 6 (six) hours as needed for wheezing or shortness of breath.   albuterol 108 (90 Base) MCG/ACT inhaler Commonly known as: ProAir HFA Inhale 2 puffs into the lungs every 6 (six) hours.   aspirin 81 MG tablet Take 81 mg  by mouth daily.   atorvastatin 40 MG tablet Commonly known as: LIPITOR Take 1 tablet (40 mg total) by mouth daily. For cholesterol   Breztri Aerosphere 160-9-4.8 MCG/ACT Aero Generic drug: Budeson-Glycopyrrol-Formoterol Inhale 2 puffs into the lungs 2 (two) times daily.   budesonide-formoterol 160-4.5 MCG/ACT inhaler Commonly known as: Symbicort TAKE 2 PUFFS BY MOUTH TWICE A DAY   cholecalciferol 1000 units tablet Commonly known as: VITAMIN D Take 1,000 Units by mouth daily.   dextromethorphan-guaiFENesin 30-600 MG 12hr tablet Commonly known as: MUCINEX DM Take 1 tablet by mouth 2 (two) times daily as needed for cough.   Dupixent 300 MG/2ML Sopn Generic drug: Dupilumab Inject 1 Dose into the skin every 14 (fourteen) days.   folic acid 1 MG tablet Commonly known as: FOLVITE Take 1 tablet (1 mg total) by mouth daily.   levothyroxine 50 MCG tablet Commonly known as: SYNTHROID 1 TABLET EVERY MORNING BEFORE BREAKFAST-WAIT 1 HR AFTER TAKING BEFORE EATING OR DRINKING   NIFEdipine 60 MG 24 hr tablet Commonly known as: ADALAT CC Take 1 tablet (60 mg total) by mouth daily.   olmesartan 40 MG tablet Commonly known as: BENICAR Take 1 tablet (40 mg total) by mouth daily. For blood pressure   PEPCID PO Take 1 tablet by mouth daily.   triamcinolone cream 0.1 % Commonly known as: KENALOG   vitamin B-12 250 MCG tablet Commonly known as: CYANOCOBALAMIN Take 250 mcg by mouth daily.   vitamin C 100 MG tablet Take 100 mg by mouth daily.       Meds ordered this encounter  Medications  . olmesartan (BENICAR) 40 MG tablet    Sig: Take 1 tablet (40 mg total) by mouth daily. For blood pressure    Dispense:  90 tablet    Refill:  1  . NIFEdipine (ADALAT CC) 60  MG 24 hr tablet    Sig: Take 1 tablet (60 mg total) by mouth daily.    Dispense:  90 tablet    Refill:  1  . levothyroxine (SYNTHROID) 50 MCG tablet    Sig: 1 TABLET EVERY MORNING BEFORE BREAKFAST-WAIT 1 HR AFTER  TAKING BEFORE EATING OR DRINKING    Dispense:  90 tablet    Refill:  1  . budesonide-formoterol (SYMBICORT) 160-4.5 MCG/ACT inhaler    Sig: TAKE 2 PUFFS BY MOUTH TWICE A DAY    Dispense:  30.6 Inhaler    Refill:  1  . atorvastatin (LIPITOR) 40 MG tablet    Sig: Take 1 tablet (40 mg total) by mouth daily. For cholesterol    Dispense:  90 tablet    Refill:  1  . albuterol (PROAIR HFA) 108 (90 Base) MCG/ACT inhaler    Sig: Inhale 2 puffs into the lungs every 6 (six) hours.    Dispense:  1 g    Refill:  5    Patient is stable through all of his diagnoses.  He is new to cholesterol medicine and tolerating it well.  He is due for blood work for that today as noted above.  His COPD is his most significant challenge and Dr. Melvyn Novas is following him for that.  He does need refills of multiple medications including his inhalers.  That will be done today as well.  Labs ordered, meds refilled.  Follow-up: Return in about 6 months (around 01/03/2020).  Claretta Fraise, M.D.

## 2019-07-07 LAB — CMP14+EGFR
ALT: 50 IU/L — ABNORMAL HIGH (ref 0–44)
AST: 35 IU/L (ref 0–40)
Albumin/Globulin Ratio: 1.7 (ref 1.2–2.2)
Albumin: 4.5 g/dL (ref 3.8–4.8)
Alkaline Phosphatase: 115 IU/L (ref 39–117)
BUN/Creatinine Ratio: 10 (ref 10–24)
BUN: 9 mg/dL (ref 8–27)
Bilirubin Total: 0.7 mg/dL (ref 0.0–1.2)
CO2: 22 mmol/L (ref 20–29)
Calcium: 9.6 mg/dL (ref 8.6–10.2)
Chloride: 98 mmol/L (ref 96–106)
Creatinine, Ser: 0.86 mg/dL (ref 0.76–1.27)
GFR calc Af Amer: 108 mL/min/{1.73_m2} (ref >59)
GFR calc non Af Amer: 94 mL/min/{1.73_m2} (ref >59)
Globulin, Total: 2.6 g/dL (ref 1.5–4.5)
Glucose: 158 mg/dL — ABNORMAL HIGH (ref 65–99)
Potassium: 4.3 mmol/L (ref 3.5–5.2)
Sodium: 138 mmol/L (ref 134–144)
Total Protein: 7.1 g/dL (ref 6.0–8.5)

## 2019-07-07 LAB — CBC WITH DIFFERENTIAL/PLATELET
Basophils Absolute: 0.1 10*3/uL (ref 0.0–0.2)
Basos: 1 %
EOS (ABSOLUTE): 0.1 10*3/uL (ref 0.0–0.4)
Eos: 1 %
Hematocrit: 50.5 % (ref 37.5–51.0)
Hemoglobin: 17.5 g/dL (ref 13.0–17.7)
Immature Grans (Abs): 0 10*3/uL (ref 0.0–0.1)
Immature Granulocytes: 0 %
Lymphocytes Absolute: 1.4 10*3/uL (ref 0.7–3.1)
Lymphs: 16 %
MCH: 35.4 pg — ABNORMAL HIGH (ref 26.6–33.0)
MCHC: 34.7 g/dL (ref 31.5–35.7)
MCV: 102 fL — ABNORMAL HIGH (ref 79–97)
Monocytes Absolute: 0.7 10*3/uL (ref 0.1–0.9)
Monocytes: 8 %
Neutrophils Absolute: 6.4 10*3/uL (ref 1.4–7.0)
Neutrophils: 74 %
Platelets: 239 10*3/uL (ref 150–450)
RBC: 4.95 x10E6/uL (ref 4.14–5.80)
RDW: 11.8 % (ref 11.6–15.4)
WBC: 8.7 10*3/uL (ref 3.4–10.8)

## 2019-07-07 LAB — TSH+FREE T4
Free T4: 1.49 ng/dL (ref 0.82–1.77)
TSH: 2.34 u[IU]/mL (ref 0.450–4.500)

## 2019-07-07 LAB — PSA TOTAL (REFLEX TO FREE): Prostate Specific Ag, Serum: 0.8 ng/mL (ref 0.0–4.0)

## 2019-07-07 LAB — LIPID PANEL
Chol/HDL Ratio: 2.3 ratio (ref 0.0–5.0)
Cholesterol, Total: 136 mg/dL (ref 100–199)
HDL: 58 mg/dL (ref >39)
LDL Chol Calc (NIH): 60 mg/dL (ref 0–99)
Triglycerides: 94 mg/dL (ref 0–149)
VLDL Cholesterol Cal: 18 mg/dL (ref 5–40)

## 2019-07-07 NOTE — Progress Notes (Signed)
Hello Frank Rosales,  Your lab result is normal and/or stable.Some minor variations that are not significant are commonly marked abnormal, but do not represent any medical problem for you.  Best regards, Arvella Massingale, M.D.

## 2019-07-21 ENCOUNTER — Ambulatory Visit (INDEPENDENT_AMBULATORY_CARE_PROVIDER_SITE_OTHER): Payer: Medicare Other

## 2019-07-21 DIAGNOSIS — Z0000 Encounter for general adult medical examination without abnormal findings: Secondary | ICD-10-CM

## 2019-07-21 NOTE — Progress Notes (Signed)
MEDICARE ANNUAL WELLNESS VISIT  07/21/2019  Telephone Visit Disclaimer This Medicare AWV was conducted by telephone due to national recommendations for restrictions regarding the COVID-19 Pandemic (e.g. social distancing).  I verified, using two identifiers, that I am speaking with Frank Rosales or their authorized healthcare agent. I discussed the limitations, risks, security, and privacy concerns of performing an evaluation and management service by telephone and the potential availability of an in-person appointment in the future. The patient expressed understanding and agreed to proceed.   Subjective:  Frank Rosales is a 64 y.o. male patient of Stacks, Cletus Gash, MD who had a Medicare Annual Wellness Visit today via telephone. Jonquez is Disabled and lives with their spouse. he has 1 children. he reports that he is socially active and does interact with friends/family regularly. he is minimally physically active and enjoys reading.  Patient Care Team: Claretta Fraise, MD as PCP - General (Family Medicine) Elsie Stain, MD as Attending Physician (Pulmonary Disease) Tanda Rockers, MD as Consulting Physician (Pulmonary Disease) Derek Jack, MD as Consulting Physician (Hematology)  Advanced Directives 07/18/2018 06/24/2018 06/04/2018 08/20/2016 08/06/2016 08/05/2016 09/27/2015  Does Patient Have a Medical Advance Directive? No No No No No No No  Would patient like information on creating a medical advance directive? Yes (MAU/Ambulatory/Procedural Areas - Information given) - No - Patient declined No - Patient declined No - Patient declined No - Patient declined -    Hospital Utilization Over the Past 12 Months: # of hospitalizations or ER visits: 0 # of surgeries: 0  Review of Systems    Patient reports that his overall health is unchanged compared to last year.  Patient Reported Readings   Gained weight. Relates this change to Covid pandemic. COPD diagnosis interferes with  patient's social activity.  Pain Assessment  Denies pain today    Current Medications & Allergies (verified) Allergies as of 07/21/2019      Reactions   No Known Allergies       Medication List       Accurate as of July 21, 2019  9:29 AM. If you have any questions, ask your nurse or doctor.        STOP taking these medications   Breztri Aerosphere 160-9-4.8 MCG/ACT Aero Generic drug: Budeson-Glycopyrrol-Formoterol     TAKE these medications   acetaminophen 325 MG tablet Commonly known as: TYLENOL Take 2 tablets (650 mg total) by mouth every 6 (six) hours as needed.   albuterol (2.5 MG/3ML) 0.083% nebulizer solution Commonly known as: PROVENTIL Take 3 mLs (2.5 mg total) by nebulization every 6 (six) hours as needed for wheezing or shortness of breath. What changed: Another medication with the same name was changed. Make sure you understand how and when to take each.   albuterol 108 (90 Base) MCG/ACT inhaler Commonly known as: ProAir HFA Inhale 2 puffs into the lungs every 6 (six) hours. What changed:   when to take this  reasons to take this   aspirin 81 MG tablet Take 81 mg by mouth daily.   atorvastatin 40 MG tablet Commonly known as: LIPITOR Take 1 tablet (40 mg total) by mouth daily. For cholesterol   budesonide-formoterol 160-4.5 MCG/ACT inhaler Commonly known as: Symbicort TAKE 2 PUFFS BY MOUTH TWICE A DAY   cholecalciferol 1000 units tablet Commonly known as: VITAMIN D Take 1,000 Units by mouth daily.   dextromethorphan-guaiFENesin 30-600 MG 12hr tablet Commonly known as: MUCINEX DM Take 1 tablet by mouth 2 (two) times  daily as needed for cough.   Dupixent 300 MG/2ML Sopn Generic drug: Dupilumab Inject 1 Dose into the skin every 14 (fourteen) days.   folic acid 1 MG tablet Commonly known as: FOLVITE Take 1 tablet (1 mg total) by mouth daily.   levothyroxine 50 MCG tablet Commonly known as: SYNTHROID 1 TABLET EVERY MORNING BEFORE  BREAKFAST-WAIT 1 HR AFTER TAKING BEFORE EATING OR DRINKING   NIFEdipine 60 MG 24 hr tablet Commonly known as: ADALAT CC Take 1 tablet (60 mg total) by mouth daily.   olmesartan 40 MG tablet Commonly known as: BENICAR Take 1 tablet (40 mg total) by mouth daily. For blood pressure   PEPCID PO Take 1 tablet by mouth daily.   triamcinolone cream 0.1 % Commonly known as: KENALOG   vitamin B-12 250 MCG tablet Commonly known as: CYANOCOBALAMIN Take 250 mcg by mouth daily.   vitamin C 100 MG tablet Take 100 mg by mouth daily.       History (reviewed): Past Medical History:  Diagnosis Date  . Anxiety   . Arthritis    "neck; lower back; right hip; right knee" (10/18/2014)  . Asthma   . Atypical nevus 03/27/2011   Mid Back-Severe ([facility])  . Atypical nevus 01/20/2007   Right Flank-Marked ([facility])  . Colon polyps   . COPD (chronic obstructive pulmonary disease) ([place])   . High blood pressure   . High cholesterol   . Hilar adenopathy   . Hypothyroidism   . Multiple rib fractures 08/2016   left side  . Squamous cell carcinoma in situ (SCCIS) 01/09/2016   Right Low Lip Med (Cx3,5FU,LN2) and Top Scalp Sup (Cx3,5FU)  . Superficial nodular basal cell carcinoma (BCC) 01/09/2016   Left Sideburn(Exc)   Past Surgical History:  Procedure Laterality Date  . BACK SURGERY    . epidural injections    . FIXATION KYPHOPLASTY LUMBAR SPINE     "L1"  . KNEE ARTHROSCOPY Right 05/14/2010  . PLEURAL EFFUSION DRAINAGE Left 08/22/2016   Procedure: DRAINAGE OF HEMOTHORAX;  Surgeon: [name] Nakayama, MD;  Location: [facility];  Service: Thoracic;  Laterality: Left;  . RIB PLATING Left 08/22/2016   Procedure: RIB PLATING;  Surgeon: [name] Nakayama, MD;  Location: [facility];  Service: Thoracic;  Laterality: Left;  Marland Kitchen VIDEO ASSISTED THORACOSCOPY Left 08/22/2016   Procedure: VIDEO ASSISTED THORACOSCOPY;  Surgeon: [name] Nakayama, MD;  Location: [facility];  Service: Thoracic;  Laterality: Left;    . WISDOM TOOTH EXTRACTION     Family History  Problem Relation Age of Onset  . Emphysema Father   . Lung cancer Father   . Heart disease Brother   . Heart failure Mother   . Diabetes Mother    Social History   Socioeconomic History  . Marital status: Married    Spouse name: Not on file  . Number of children: 1  . Years of education: Not on file  . Highest education level: Associate degree: occupational, Hotel manager, or vocational program  Occupational History  . Occupation: Company secretary: [employer]  Tobacco Use  . Smoking status: Former Smoker    Packs/day: 1.50    Years: 45.00    Pack years: 67.50    Types: Cigarettes    Quit date: 11/11/2017    Years since quitting: 1.6  . Smokeless tobacco: Never Used  . Tobacco comment:     Substance and Sexual Activity  . Alcohol use: Yes    Alcohol/week: 42.0  standard drinks    Types: 42 Cans of beer per week    Comment: 6 beer a day   . Drug use: No  . Sexual activity: Not Currently  Other Topics Concern  . Not on file  Social History Narrative  . Not on file   Social Determinants of Health   Financial Resource Strain:   . Difficulty of Paying Living Expenses: Not on file  Food Insecurity:   . Worried About Charity fundraiser in the Last Year: Not on file  . Ran Out of Food in the Last Year: Not on file  Transportation Needs:   . Lack of Transportation (Medical): Not on file  . Lack of Transportation (Non-Medical): Not on file  Physical Activity:   . Days of Exercise per Week: Not on file  . Minutes of Exercise per Session: Not on file  Stress:   . Feeling of Stress : Not on file  Social Connections:   . Frequency of Communication with Friends and Family: Not on file  . Frequency of Social Gatherings with Friends and Family: Not on file  . Attends Religious Services: Not on file  . Active Member of Clubs or Organizations: Not on file  . Attends Archivist Meetings: Not on file  . Marital Status:  Not on file    Activities of Daily Living No flowsheet data found.  Patient Education/ Literacy    Exercise    Diet Patient reports consuming 2 meals a day and 1 snack(s) a day Patient reports that his primary diet is: Regular Patient reports that she does have regular access to food.   Depression Screen PHQ 2/9 Scores 07/06/2019 07/18/2018 07/01/2018 06/02/2018 05/19/2018 05/02/2018 12/27/2017  PHQ - 2 Score 0 0 0 0 0 0 0  PHQ- 9 Score - - - - - - -     Fall Risk Fall Risk  07/06/2019 07/18/2018 06/02/2018 05/19/2018 12/27/2017  Falls in the past year? 0 1 0 0 No  Number falls in past yr: 0 1 - - -  Injury with Fall? 0 1 - - -  Risk for fall due to : No Fall Risks Impaired balance/gait;History of fall(s) - - -     Objective:  Frank Rosales seemed alert and oriented and he participated appropriately during our telephone visit.  Blood Pressure Weight BMI  BP Readings from Last 3 Encounters:  07/06/19 140/89  03/31/19 134/76  07/18/18 (!) 143/93   Wt Readings from Last 3 Encounters:  07/06/19 194 lb (88 kg)  03/31/19 213 lb (96.6 kg)  07/18/18 205 lb (93 kg)   BMI Readings from Last 1 Encounters:  07/06/19 28.65 kg/m    *Unable to obtain current vital signs, weight, and BMI due to telephone visit type  Hearing/Vision  . Paycen did not seem to have difficulty with hearing/understanding during the telephone conversation . Reports that he has not had a formal eye exam by an eye care professional within the past year . Reports that he has not had a formal hearing evaluation within the past year *Unable to fully assess hearing and vision during telephone visit type  Cognitive Function: No flowsheet data found. (Normal:0-7, Significant for Dysfunction: >8)  Normal Cognitive Function Screening: Yes   Immunization & Health Maintenance Record Immunization History  Administered Date(s) Administered  . Influenza Split 02/12/2012, 02/11/2013  . Influenza Whole 03/12/2011  .  Influenza,inj,Quad PF,6+ Mos 02/21/2015, 03/07/2016, 03/06/2017, 02/20/2018, 03/10/2019  . Pneumococcal Conjugate-13 02/21/2015  .  Pneumococcal Polysaccharide-23 04/14/2013  . Td 05/14/2002  . Tdap 02/06/2013    Health Maintenance  Topic Date Due  . Samul Dada  02/07/2023  . COLONOSCOPY  02/23/2023  . INFLUENZA VACCINE  Completed  . Hepatitis C Screening  Completed  . HIV Screening  Completed       Assessment  This is a routine wellness examination for Frank Rosales.  Health Maintenance: Due or Overdue There are no preventive care reminders to display for this patient.  Frank Rosales does not need a referral for [employer] Assistance: Care Management:   no Social Work:    no Prescription Assistance:  no Nutrition/Diabetes Education:  no   Plan:  Personalized Goals Increase physical activity as tolerated  Personalized Health Maintenance & Screening Recommendations    Lung Cancer Screening Recommended: no (Low Dose CT Chest recommended if Age 57-80 years, 30 pack-year currently smoking OR have quit w/in past 15 years) Hepatitis C Screening recommended: no HIV Screening recommended: no  Advanced Directives: Written information was not prepared per patient's request.  Referrals & Orders No orders of the defined types were placed in this encounter.   Follow-up Plan . Follow-up with Claretta Fraise, MD as planned . Scheduled 01/04/2020    I have personally reviewed and noted the following in the patient's chart:   . Medical and social history . Use of alcohol, tobacco or illicit drugs  . Current medications and supplements . Functional ability and status . Nutritional status . Physical activity . Advanced directives . List of other physicians . Hospitalizations, surgeries, and ER visits in previous 12 months . Vitals . Screenings to include cognitive, depression, and falls . Referrals and appointments  In addition, I have reviewed and discussed with  Frank Rosales certain preventive protocols, quality metrics, and best practice recommendations. A written personalized care plan for preventive services as well as general preventive health recommendations is available and can be mailed to the patient at his request.      Alphonzo Dublin, LPN [id number]

## 2019-08-11 ENCOUNTER — Telehealth: Payer: Self-pay | Admitting: Family Medicine

## 2019-08-11 NOTE — Chronic Care Management (AMB) (Signed)
  Chronic Care Management   Outreach Note  08/11/2019 Name: Frank Rosales MRN: [id number] DOB: 1956/04/27  Frank Rosales is a 64 y.o. year old male who is a primary care patient of Stacks, Cletus Gash, MD. I reached out to Frank Rosales by phone today in response to a referral sent by Frank Rosales health plan.     An unsuccessful telephone outreach was attempted today. The patient was referred to the case management team for assistance with care management and care coordination.   Follow Up Plan: A HIPPA compliant phone message was left for the patient providing contact information and requesting a return call. The care management team will reach out to the patient again over the next 7 days. If patient returns call to provider office, please advise to call [facility] at [phone].  [place], [place] [address] Direct Dial: [phone] [email or web address]: [email or web address]

## 2019-08-12 NOTE — Chronic Care Management (AMB) (Signed)
  Chronic Care Management   Note  08/12/2019 Name: Frank Rosales MRN: [id number] DOB: [birth year]  Frank Rosales is a 64 y.o. year old male who is a primary care patient of Stacks, Cletus Gash, MD. I reached out to [name] by phone today in response to a referral sent by Frank Rosales health plan.     Frank Rosales was given information about Chronic Care Management services today including:  1. CCM service includes personalized support from designated clinical staff supervised by his physician, including individualized plan of care and coordination with other care providers 2. 24/7 contact phone numbers for assistance for urgent and routine care needs. 3. Service will only be billed when office clinical staff spend 20 minutes or more in a month to coordinate care. 4. Only one practitioner may furnish and bill the service in a calendar month. 5. The patient may stop CCM services at any time (effective at the end of the month) by phone call to the office staff. 6. The patient will be responsible for cost sharing (co-pay) of up to 20% of the service fee (after annual deductible is met).  Patient did not agree to enrollment in care management services and does not wish to consider at this time.  Follow up plan: The patient has been provided with contact information for the care management team and has been advised to call with any health related questions or concerns.   [place], [place] [address] Direct Dial: [phone] [email or web address]: [email or web address]

## 2019-09-23 ENCOUNTER — Other Ambulatory Visit: Payer: Self-pay | Admitting: *Deleted

## 2019-09-23 DIAGNOSIS — I10 Essential (primary) hypertension: Secondary | ICD-10-CM

## 2019-09-23 DIAGNOSIS — E7800 Pure hypercholesterolemia, unspecified: Secondary | ICD-10-CM

## 2019-09-23 DIAGNOSIS — J449 Chronic obstructive pulmonary disease, unspecified: Secondary | ICD-10-CM

## 2019-09-23 DIAGNOSIS — E039 Hypothyroidism, unspecified: Secondary | ICD-10-CM

## 2019-09-23 MED ORDER — BUDESONIDE-FORMOTEROL FUMARATE 160-4.5 MCG/ACT IN AERO: INHALATION_SPRAY | RESPIRATORY_TRACT | 0 refills | Status: DC

## 2019-09-23 MED ORDER — OLMESARTAN MEDOXOMIL 40 MG PO TABS: 40.0000 mg | ORAL_TABLET | Freq: Every day | ORAL | 0 refills | Status: DC

## 2019-09-23 MED ORDER — LEVOTHYROXINE SODIUM 50 MCG PO TABS: ORAL_TABLET | ORAL | 2 refills | Status: DC

## 2019-09-23 MED ORDER — ALBUTEROL SULFATE HFA 108 (90 BASE) MCG/ACT IN AERS: 2.0000 | INHALATION_SPRAY | Freq: Four times a day (QID) | RESPIRATORY_TRACT | 0 refills | Status: DC

## 2019-09-23 MED ORDER — NIFEDIPINE ER 60 MG PO TB24: 60.0000 mg | ORAL_TABLET | Freq: Every day | ORAL | 0 refills | Status: DC

## 2019-09-23 MED ORDER — ATORVASTATIN CALCIUM 40 MG PO TABS: 40.0000 mg | ORAL_TABLET | Freq: Every day | ORAL | 0 refills | Status: DC

## 2019-09-25 ENCOUNTER — Telehealth: Payer: Self-pay | Admitting: *Deleted

## 2019-09-25 MED ORDER — DUPIXENT 300 MG/2ML ~~LOC~~ SOAJ: 1.0000 | SUBCUTANEOUS | 0 refills | Status: DC

## 2019-09-28 ENCOUNTER — Ambulatory Visit: Payer: Medicare Other | Admitting: Internal Medicine

## 2019-09-29 ENCOUNTER — Telehealth: Payer: Self-pay | Admitting: Dermatology

## 2019-09-29 NOTE — Telephone Encounter (Signed)
Estill Bamberg with [employer] is calling about patient's dupixent prescription. Estill Bamberg states dupixent was prescribed in pen form and needs to be prescribed as a pre-filled syringe.  Return call if there are questions to [place] at [phone] ext [phone].  Fax prescription to [phone]. Chart 0991

## 2019-09-29 NOTE — Telephone Encounter (Signed)
Phone call from [facility] asking if patient can have pre filled syringe instead of dupixent pen. Informed yes.

## 2019-10-02 NOTE — Telephone Encounter (Signed)
Refill for Dupixent done.

## 2019-10-05 ENCOUNTER — Ambulatory Visit: Payer: Medicare Other | Admitting: Internal Medicine

## 2019-10-05 ENCOUNTER — Ambulatory Visit (INDEPENDENT_AMBULATORY_CARE_PROVIDER_SITE_OTHER): Payer: Medicare Other

## 2019-10-05 ENCOUNTER — Encounter: Payer: Self-pay | Admitting: Internal Medicine

## 2019-10-05 ENCOUNTER — Other Ambulatory Visit: Payer: Self-pay

## 2019-10-05 DIAGNOSIS — J449 Chronic obstructive pulmonary disease, unspecified: Secondary | ICD-10-CM | POA: Diagnosis not present

## 2019-10-05 DIAGNOSIS — R058 Other specified cough: Secondary | ICD-10-CM

## 2019-10-05 DIAGNOSIS — R05 Cough: Secondary | ICD-10-CM | POA: Diagnosis not present

## 2019-10-05 MED ORDER — METHYLPREDNISOLONE ACETATE 80 MG/ML IJ SUSP
120.0000 mg | Freq: Once | INTRAMUSCULAR | Status: AC
  Administered 2019-10-05: 120 mg via INTRAMUSCULAR

## 2019-10-05 NOTE — Progress Notes (Signed)
Subjective:   Patient ID: Frank Rosales, male    DOB: 12-17-1955    MRN: [id number]    Brief patient profile:  15  yowm "quit smoking"  11/2017  with h/o exposure to Asbestosis at [place] ? Into the 80s  still able to walk  flat fine but steps x 3 flights sob with GOLD III copd 2013 prev eval by Dr Joya Gaskins  / gets annual f/u in [place]     History of Present Illness  02/16/2016  f/u ov/Frank Rosales re:  GOLD III copd /  symbicort 160 2bid / twice weekly saba  Chief Complaint  Patient presents with  . Follow-up    Pt states had CT Chest done 12/27/15- ordered by worker's comp for eval of previous asbestos exp. He states he had been doing well until July 2017 developed cough and congestion that lasted 2 months, but starting to improve.   sick July - September 2017 rx with abx/ steroids better since early Sept 2017 but CT was done in middle of this illness and was abnormal but was done for purpose of screening for asbestos  rec Plan A = Automatic = symbicort 160 Take 2 puffs first thing in am and then another 2 puffs about 12 hours later.  Work on inhaler technique:   Only use your albuterol(proair)  as a rescue medication  The key is to stop smoking completely before smoking completely stops you!      11/07/2016  f/u ov/Frank Rosales re: acute cough - maintained on symb 160 2bid  Chief Complaint  Patient presents with  . Follow-up    Pt c/o chest and nasal congestion, cough, increased SOB for the past wk. His cough is prod with very thick, clear sputum. He states "feels like I'm drowning"-seen by PCP on 6/22 and given steroid inj and abx with no relief.   acute onset with sore thorat gone w/in first day but then nasal / congestion rx by Dr Quinn Axe on 11/02/16 with pred/levaquin Mucus is now clear  Using saba 2 4 x daily  rec Plan A = Automatic = Symbicort 160  Take 2 puffs first thing in am and then another 2 puffs about 12 hours later.  Plan B = Backup Only use your albuterol as a rescue  medication Prednisone 10 mg take  4 each am x 2 days,   2 each am x 2 days,  1 each am x 2 days and stop  Try prilosec otc 20mg   Take 30-60 min before first meal of the day and Pepcid ac (famotidine) 20 mg one @  bedtime until cough is completely gone for at least a week without the need for cough suppression and stop the krill oil for the same reason until no cough at all  For cough /congestion > mucinex dm up to 1200 mg every 12 hours as needed and tramadol 50 mg up to 2 every 4 hours  For nasal congestion;  advil cold and sinus congestion every 4 hours as needed    12/23/2017  f/u ov/Frank Rosales re:   COPD III/ stopped smoking  July 2019  Chief Complaint  Patient presents with  . Follow-up    He c/o non prod cough- esp worse in the am's. Breathing is about the same. He has completely stopped smoking. He is using his rescue inhaler about once per wk.   Dyspnea:  MMRC3 = can't walk 100 yards even at a slow pace at a flat grade s stopping due  to sob   Cough: very harsh only with activity indoor vs outdoor the same/ uses mucinex dm ? Dose   Sleeping: ok p otc sleep aide on 2 pillows SABA use: rare 02: none   rec Stop fish oil when coughing   06/13/2018 acute extended  ov/Frank Rosales re:  Copd III/ still off cigs/ on pred for rash but down to 5 mg daily  Chief Complaint  Patient presents with  . Acute Visit    Pt states getting over bronchitis that he developed about a month. He has cough with minimal clear to somtimes green sputum.  He states he was txed with levaquin and a steroid injection. He rarely uses his rescue inhaler or neb.    Dyspnea:  Back to baseline  > back bothers him more than breathing most of the time  Cough: worse mid morning still green p levaquin Sleeping: on back / 2 pillows  SABA use:  2-3 x per month rec Add on spiriva 2.5 x 2 pff each am  Work on inhaler technique:  Augmentin 875 mg take one pill twice daily  X 10 days  > transiently better    06/27/2018  f/u ov/Frank Rosales re:  copd III/ not convinced better on spiriva vs off  Chief Complaint  Patient presents with  . Follow-up    Pt states he is about the same since last visit. Has complaints of cough with green phlegm and SOB with exertion.  Dyspnea:  MMRC3 = can't walk 100 yards even at a slow pace at a flat grade s stopping due to sob   Cough: worse in am  Assoc with nasal congestion  Sleeping: on back/ 2 pillows  SABA use: rarely  rec Prednisone 10 mg take  4 each am x 2 days,   2 each am x 2 days,  1 each am x 2 days and stop  Augmentin 875 mg take one pill twice daily  X 10 days - take at breakfast and supper with large glass of water.  It would help reduce the usual side effects (diarrhea and yeast infections) if you ate cultured yogurt at lunch.  Mucinex dm up to 1200  every 12 hours as needed  Adding pepcid 20 mg one at bedtime for 2 weeks to see if helps your am cough   Please schedule a follow up office visit in 6 weeks, call sooner if needed with all medications /inhalers/ solutions in hand so we can verify exactly what you are taking. This includes all medications from all doctors and over the counters    televist 11/13/18 no change recs    03/31/2019  f/u ov/Frank Rosales re:  GOLD III  No better on spiriva  Chief Complaint  Patient presents with  . Follow-up    breathing is "not too bad"- using proair and neb both rarely.   Dyspnea:  Only time he leaves the house is to go to doctor/ one flight and stops at top Cough: better, non-productive  Sleeping: on back or side with bed 2 pillows  SABA use: rarely  02: none  rec Plan A = Automatic = Always=    symbicort 160 (or Breztri not both) Take 2 puffs first thing in am and then another 2 puffs about 12 hours later.  Plan B = Backup (to supplement plan A, not to replace it) Only use your albuterol inhaler as a rescue medication Plan C = Crisis (instead of Plan B but only if Plan B stops working) -  only use your albuterol nebulizer if you first try Plan B  and it fails to help     10/05/2019  f/u ov/Frank Rosales re:  GOLD III prefers symbicort /on  dupixent since spring/summer 2020 per [place]  Patient presents with  . Follow-up    Coughing more and feeling more SOB "with the pollen"- clear sputum. He uses his albuterol inhaler about once per wk and never uses neb.   Dyspnea:  Very sedentary, gaining wt  Cough: more congested thicker x 2-3 weeks  Sleeping: on back 2 pillows  SABA use: as above  02: 02    No obvious day to day or daytime variability or assoc excess/ purulent sputum or mucus plugs or hemoptysis or cp or chest tightness, subjective wheeze or overt sinus or hb symptoms.   Sleeping  without nocturnal  or early am exacerbation  of respiratory  c/o's or need for noct saba. Also denies any obvious fluctuation of symptoms with weather or environmental changes or other aggravating or alleviating factors except as outlined above   No unusual exposure hx or h/o childhood pna/ asthma or knowledge of premature birth.  Current Allergies, Complete Past Medical History, Past Surgical History, Family History, and Social History were reviewed in [employer] record.  ROS  The following are not active complaints unless bolded Hoarseness, sore throat, dysphagia, dental problems, itching, sneezing,  nasal congestion or discharge of excess mucus or purulent secretions, ear ache,   fever, chills, sweats, unintended wt loss or wt gain, classically pleuritic or exertional cp,  orthopnea pnd or arm/hand swelling  or leg swelling, presyncope, palpitations, abdominal pain, anorexia, nausea, vomiting, diarrhea  or change in bowel habits or change in bladder habits, change in stools or change in urine, dysuria, hematuria,  rash, arthralgias, visual complaints, headache, numbness, weakness or ataxia or problems with walking or coordination,  change in mood or  memory.        Current Meds  Medication Sig  . acetaminophen  (TYLENOL) 325 MG tablet Take 2 tablets (650 mg total) by mouth every 6 (six) hours as needed.  Marland Kitchen albuterol (PROAIR HFA) 108 (90 Base) MCG/ACT inhaler Inhale 2 puffs into the lungs every 6 (six) hours.  Marland Kitchen albuterol (PROVENTIL) (2.5 MG/3ML) 0.083% nebulizer solution Take 3 mLs (2.5 mg total) by nebulization every 6 (six) hours as needed for wheezing or shortness of breath.  . Ascorbic Acid (VITAMIN C) 100 MG tablet Take 100 mg by mouth daily.  Marland Kitchen aspirin 81 MG tablet Take 81 mg by mouth daily.   Marland Kitchen atorvastatin (LIPITOR) 40 MG tablet Take 1 tablet (40 mg total) by mouth daily. For cholesterol  . budesonide-formoterol (SYMBICORT) 160-4.5 MCG/ACT inhaler TAKE 2 PUFFS BY MOUTH TWICE A DAY  . cholecalciferol (VITAMIN D) 1000 UNITS tablet Take 1,000 Units by mouth daily.   Marland Kitchen dextromethorphan-guaiFENesin (MUCINEX DM) 30-600 MG 12hr tablet Take 1 tablet by mouth 2 (two) times daily as needed for cough.  . Dupilumab (DUPIXENT) 300 MG/2ML SOPN Inject 1 Dose into the skin every 14 (fourteen) days.  . Famotidine (PEPCID PO) Take 1 tablet by mouth daily.  . folic acid (FOLVITE) 1 MG tablet Take 1 tablet (1 mg total) by mouth daily.  Marland Kitchen levothyroxine (SYNTHROID) 50 MCG tablet 1 TABLET EVERY MORNING BEFORE BREAKFAST-WAIT 1 HR AFTER TAKING BEFORE EATING OR DRINKING  . NIFEdipine (ADALAT CC) 60 MG 24 hr tablet Take 1 tablet (60 mg total) by mouth daily.  Marland Kitchen olmesartan (  BENICAR) 40 MG tablet Take 1 tablet (40 mg total) by mouth daily. For blood pressure  . triamcinolone cream (KENALOG) 0.1 %   . vitamin B-12 (CYANOCOBALAMIN) 250 MCG tablet Take 250 mcg by mouth daily.                  Objective:   Physical Exam  Hoarse amb wm / slt congested cough   Vital signs reviewed  10/05/2019  - Note at rest 02 sats  96% on RA      10/05/2019    228 03/31/2019  213  01/24/2015        180 > 02/21/2015  180  > 06/16/2015  186  > 02/16/2016  205 > 09/13/2016   185 >  11/07/2016  187 >  12/21/2016  195 > 03/25/2017   200 >  06/25/2017   198  > 12/23/2017   199  > 06/13/2018  200      11/10/14 174 lb (78.926 kg)  11/08/14 176 lb 6.4 oz (80.015 kg)  10/27/14 175 lb (79.379 kg)     HEENT : pt wearing mask not removed for exam due to covid -19 concerns.    NECK :  without JVD/Nodes/TM/ nl carotid upstrokes bilaterally   LUNGS: no acc muscle use,  Mod barrel  contour chest wall with bilateral  Distant bs s audible wheeze and  without cough on insp or exp maneuvers and mod  Hyperresonant  to  percussion bilaterally     CV:  RRR  no s3 or murmur or increase in P2, and no edema   ABD:  soft and nontender with pos mid insp Hoover's  in the supine position. No bruits or organomegaly appreciated, bowel sounds nl  MS:     ext warm without deformities, calf tenderness, cyanosis or clubbing No obvious joint restrictions   SKIN: warm and dry without lesions    NEURO:  alert, approp, nl sensorium with  no motor or cerebellar deficits apparent.                     Assessment & Plan:

## 2019-10-05 NOTE — Patient Instructions (Addendum)
For cough/ congestion :  mucinex or mucinex dm up to 1200 mg every 12 hours   Depomedrol 120 mg IM   Ok to schedule follow up at the [place] clinic in about 3 months

## 2019-10-06 ENCOUNTER — Encounter: Payer: Self-pay | Admitting: Internal Medicine

## 2019-10-06 NOTE — Assessment & Plan Note (Addendum)
Rec h2 hs 06/27/18 - Sinus CT 07/07/2018 >>> Clear sinuses - improved as of 11/13/2018 though on dupixent per derm for rash   Mild flare/ ? Related to polleninduced  rjinits assoc cough > rx depomedrol 120 mg IM/ otc's reviewed         Each maintenance medication was reviewed in detail including emphasizing most importantly the difference between maintenance and prns and under what circumstances the prns are to be triggered using an action plan format where appropriate.  Total time for H and P, chart review, counseling, teaching device and generating customized AVS unique to this office visit / charting = 20 min

## 2019-10-06 NOTE — Assessment & Plan Note (Addendum)
Quit smoking Spirometry 11/2017   03/2011 FeV1 49%    Spirometry  02/28/2012  FEV1 45%   - Last day worked = October 13 2014  - PFT's  01/24/2015  FEV1 1.59  (43 % ) ratio 54   p 22 % improvement from saba with DLCO  88 % corrects to 99 % for alv volume s symbicort  - PFTs  12/27/15 1.30 (35%) ratio 35  In midst of a flare- - PFT's  12/21/2016  FEV1 1.40 (39 % ) ratio 50  p 8 % improvement from saba p symbicort 160 x 2  prior to study with DLCO  71/69 % corrects to 85  % for alv volume - PFTS  05/16/17  FEV1  1.32( 37%)  Ratio 37  And DLCO  52% > corrects to 78%  03/25/2017  try bevespi > preferred symb 160  - 06/13/2018    try adding spiriva 2.5 x 2 each am sample  > no better so did not fill rx  - 03/31/2019   Walked RA x one lap =  approx 250 ft @ mod pace - stopped due to sob with sats of 92% at the end of the study. - 10/05/2019  After extensive coaching inhaler device,  effectiveness =    90%   AB component mild flare with pollen exp > continue symb 160/dupixent and depomedrol 120 mg IM but no change rx   F/u in [facility] office q 3 m

## 2019-10-06 NOTE — Progress Notes (Signed)
Spoke with pt and notified of results per Dr. Wert. Pt verbalized understanding and denied any questions. 

## 2019-10-14 ENCOUNTER — Other Ambulatory Visit: Payer: Self-pay

## 2019-10-21 ENCOUNTER — Other Ambulatory Visit: Payer: Self-pay | Admitting: Dermatology

## 2019-10-21 ENCOUNTER — Encounter: Payer: Self-pay | Admitting: *Deleted

## 2019-10-21 MED ORDER — DUPIXENT 300 MG/2ML ~~LOC~~ SOSY
300.0000 mg | PREFILLED_SYRINGE | SUBCUTANEOUS | 3 refills | Status: DC
Start: 2019-10-21 — End: 2020-02-19

## 2019-10-21 NOTE — Telephone Encounter (Signed)
Pharmacy left message on office voice mail for approval for refill on Dupixent 300 mg prefilled syringe 1 Box to last 28 days.  New prescription can be faxed to [phone].

## 2019-10-21 NOTE — Telephone Encounter (Signed)
Pharmacy called(see 09/29/2019 encounter)  wanting to know if they can switch from pen to prefilled syringe: Ok per Dr. Denna Haggard.  Pharmacy now needs a new prescription for prefilled syring instead of pen.

## 2019-10-26 ENCOUNTER — Other Ambulatory Visit: Payer: Self-pay | Admitting: Family Medicine

## 2019-10-26 DIAGNOSIS — E7800 Pure hypercholesterolemia, unspecified: Secondary | ICD-10-CM

## 2019-10-26 DIAGNOSIS — I10 Essential (primary) hypertension: Secondary | ICD-10-CM

## 2019-10-26 DIAGNOSIS — J449 Chronic obstructive pulmonary disease, unspecified: Secondary | ICD-10-CM

## 2019-11-25 NOTE — Telephone Encounter (Signed)
This encounter was created in error - please disregard.

## 2020-01-04 ENCOUNTER — Ambulatory Visit: Payer: Medicare Other | Admitting: Family Medicine

## 2020-01-15 ENCOUNTER — Ambulatory Visit: Payer: Medicare Other | Admitting: Family Medicine

## 2020-01-16 ENCOUNTER — Other Ambulatory Visit: Payer: Self-pay | Admitting: Family Medicine

## 2020-01-16 DIAGNOSIS — I10 Essential (primary) hypertension: Secondary | ICD-10-CM

## 2020-01-19 ENCOUNTER — Other Ambulatory Visit: Payer: Self-pay | Admitting: Family Medicine

## 2020-01-19 DIAGNOSIS — E7800 Pure hypercholesterolemia, unspecified: Secondary | ICD-10-CM

## 2020-01-19 DIAGNOSIS — I10 Essential (primary) hypertension: Secondary | ICD-10-CM

## 2020-01-19 DIAGNOSIS — J449 Chronic obstructive pulmonary disease, unspecified: Secondary | ICD-10-CM

## 2020-02-01 ENCOUNTER — Other Ambulatory Visit: Payer: Self-pay

## 2020-02-01 ENCOUNTER — Ambulatory Visit: Payer: Medicare Other | Admitting: Internal Medicine

## 2020-02-01 ENCOUNTER — Encounter: Payer: Self-pay | Admitting: Internal Medicine

## 2020-02-01 DIAGNOSIS — J449 Chronic obstructive pulmonary disease, unspecified: Secondary | ICD-10-CM | POA: Diagnosis not present

## 2020-02-01 MED ORDER — BREZTRI AEROSPHERE 160-9-4.8 MCG/ACT IN AERO
2.0000 | INHALATION_SPRAY | Freq: Two times a day (BID) | RESPIRATORY_TRACT | Status: DC
Start: 2020-02-01 — End: 2020-09-01

## 2020-02-01 MED ORDER — BREZTRI AEROSPHERE 160-9-4.8 MCG/ACT IN AERO
2.0000 | INHALATION_SPRAY | Freq: Two times a day (BID) | RESPIRATORY_TRACT | 0 refills | Status: DC
Start: 2020-02-01 — End: 2020-02-02

## 2020-02-01 NOTE — Patient Instructions (Addendum)
Try albuterol 15 min before an activity that you know would make you short of breath and see if it makes any difference and if makes none then don't take it after activity unless you can't catch your breath.     See if Dr Livia Snellen office will order alpha one antitrypsin phenotype   Plan A = Automatic = Always=    Change to Breztri Take 2 puffs first thing in am and then another 2 puffs about 12 hours later.     Plan B = Backup (to supplement plan A, not to replace it) Only use your albuterol inhaler as a rescue medication to be used if you can't catch your breath by resting or doing a relaxed purse lip breathing pattern.  - The less you use it, the better it will work when you need it. - Ok to use the inhaler up to 2 puffs  every 4 hours if you must but call for appointment if use goes up over your usual need - Don't leave home without it !!  (think of it like the spare tire for your car)   Plan C = Crisis (instead of Plan B but only if Plan B stops working) - only use your albuterol nebulizer if you first try Plan B and it fails to help > ok to use the nebulizer up to every 4 hours but if start needing it regularly call for immediate appointment  Please schedule a follow up office visit in 4 weeks, sooner if needed

## 2020-02-01 NOTE — Progress Notes (Signed)
Subjective:   Patient ID: Frank Rosales, male    DOB: 1956/01/26    MRN: [id number]    Brief patient profile:  80  yowm "quit smoking"  11/2017  with h/o exposure to Asbestosis at [place] ? Into the 80s  still able to walk  flat fine but steps x 3 flights sob with GOLD III copd 2013 prev eval by Dr Joya Gaskins  / gets annual f/u in [place]     History of Present Illness  02/16/2016  f/u ov/Frank Rosales re:  GOLD III copd /  symbicort 160 2bid / twice weekly saba  Chief Complaint  Patient presents with   Follow-up    Pt states had CT Chest done 12/27/15- ordered by worker's comp for eval of previous asbestos exp. He states he had been doing well until July 2017 developed cough and congestion that lasted 2 months, but starting to improve.   sick July - September 2017 rx with abx/ steroids better since Rosales Sept 2017 but CT was done in middle of this illness and was abnormal but was done for purpose of screening for asbestos  rec Plan A = Automatic = symbicort 160 Take 2 puffs first thing in am and then another 2 puffs about 12 hours later.  Work on inhaler technique:   Only use your albuterol(proair)  as a rescue medication  The key is to stop smoking completely before smoking completely stops you!      11/07/2016  f/u ov/Frank Rosales re: acute cough - maintained on symb 160 2bid  Chief Complaint  Patient presents with   Follow-up    Pt c/o chest and nasal congestion, cough, increased SOB for the past wk. His cough is prod with very thick, clear sputum. He states "feels like I'm drowning"-seen by PCP on 6/22 and given steroid inj and abx with no relief.   acute onset with sore thorat gone w/in first day but then nasal / congestion rx by Dr Quinn Axe on 11/02/16 with pred/levaquin Mucus is now clear  Using saba 2 4 x daily  rec Plan A = Automatic = Symbicort 160  Take 2 puffs first thing in am and then another 2 puffs about 12 hours later.  Plan B = Backup Only use your albuterol as a rescue  medication Prednisone 10 mg take  4 each am x 2 days,   2 each am x 2 days,  1 each am x 2 days and stop  Try prilosec otc 20mg   Take 30-60 min before first meal of the day and Pepcid ac (famotidine) 20 mg one @  bedtime until cough is completely gone for at least a week without the need for cough suppression and stop the krill oil for the same reason until no cough at all  For cough /congestion > mucinex dm up to 1200 mg every 12 hours as needed and tramadol 50 mg up to 2 every 4 hours  For nasal congestion;  advil cold and sinus congestion every 4 hours as needed    12/23/2017  f/u ov/Frank Rosales re:   COPD III/ stopped smoking  July 2019  Chief Complaint  Patient presents with   Follow-up    He c/o non prod cough- esp worse in the am's. Breathing is about the same. He has completely stopped smoking. He is using his rescue inhaler about once per wk.   Dyspnea:  MMRC3 = can't walk 100 yards even at a slow pace at a flat grade s stopping due  to sob   Cough: very harsh only with activity indoor vs outdoor the same/ uses mucinex dm ? Dose   Sleeping: ok p otc sleep aide on 2 pillows SABA use: rare 02: none   rec Stop fish oil when coughing   06/13/2018 acute extended  ov/Frank Rosales re:  Copd III/ still off cigs/ on pred for rash but down to 5 mg daily  Chief Complaint  Patient presents with   Acute Visit    Pt states getting over bronchitis that he developed about a month. He has cough with minimal clear to somtimes green sputum.  He states he was txed with levaquin and a steroid injection. He rarely uses his rescue inhaler or neb.    Dyspnea:  Back to baseline  > back bothers him more than breathing most of the time  Cough: worse mid morning still green p levaquin Sleeping: on back / 2 pillows  SABA use:  2-3 x per month rec Add on spiriva 2.5 x 2 pff each am  Work on inhaler technique:  Augmentin 875 mg take one pill twice daily  X 10 days  > transiently better    06/27/2018  f/u ov/Frank Rosales re:  copd III/ not convinced better on spiriva vs off  Chief Complaint  Patient presents with   Follow-up    Pt states he is about the same since last visit. Has complaints of cough with green phlegm and SOB with exertion.  Dyspnea:  MMRC3 = can't walk 100 yards even at a slow pace at a flat grade s stopping due to sob   Cough: worse in am  Assoc with nasal congestion  Sleeping: on back/ 2 pillows  SABA use: rarely  rec Prednisone 10 mg take  4 each am x 2 days,   2 each am x 2 days,  1 each am x 2 days and stop  Augmentin 875 mg take one pill twice daily  X 10 days - take at breakfast and supper with large glass of water.  It would help reduce the usual side effects (diarrhea and yeast infections) if you ate cultured yogurt at lunch.  Mucinex dm up to 1200  every 12 hours as needed  Adding pepcid 20 mg one at bedtime for 2 weeks to see if helps your am cough   Please schedule a follow up office visit in 6 weeks, call sooner if needed with all medications /inhalers/ solutions in hand so we can verify exactly what you are taking. This includes all medications from all doctors and over the counters    televist 11/13/18 no change recs    03/31/2019  f/u ov/Frank Rosales re:  GOLD III  No better on spiriva  Chief Complaint  Patient presents with   Follow-up    breathing is "not too bad"- using proair and neb both rarely.   Dyspnea:  Only time he leaves the house is to go to doctor/ one flight and stops at top Cough: better, non-productive  Sleeping: on back or side with bed 2 pillows  SABA use: rarely  02: none  rec Plan A = Automatic = Always=    symbicort 160 (or Breztri not both) Take 2 puffs first thing in am and then another 2 puffs about 12 hours later.  Plan B = Backup (to supplement plan A, not to replace it) Only use your albuterol inhaler as a rescue medication Plan C = Crisis (instead of Plan B but only if Plan B stops working) -  only use your albuterol nebulizer if you first try Plan B  and it fails to help     10/05/2019  f/u ov/Frank Rosales re:  GOLD III prefers symbicort /on  dupixent since spring/summer 2020 per [place] Complaint  Patient presents with   Follow-up    Coughing more and feeling more SOB "with the pollen"- clear sputum. He uses his albuterol inhaler about once per wk and never uses neb.   Dyspnea:  Very sedentary, gaining wt  Cough: more congested thicker x 2-3 weeks  Sleeping: on back 2 pillows  SABA use: as above  02: 02  Rec For cough/ congestion :  mucinex or mucinex dm up to 1200 mg every 12 hours  Depomedrol 120 mg IM     02/01/2020  f/u ov/[place] office/Frank Rosales re: GOLD III copd maint on symbicort/ dupixent per derm Chief Complaint  Patient presents with   Follow-up    Patient feels like his breathing has got worse since last visit. Struggles to breath with any exertion. Cough in morning with clear sputum.  Dyspnea:  Variable doe/ worse in shower / also limited by back pain which is chronic  Walks to shop x 50 yards slt hill coming back to house  Cough: some in am better p coffee  Sleeping: bed is flat/ two pillows  SABA use: once a week 02: none   No obvious day to day or daytime variability or assoc excess/ purulent sputum or mucus plugs or hemoptysis or cp or chest tightness, subjective wheeze or overt sinus or hb symptoms.   Sleeping as above without nocturnal    exacerbation  of respiratory  c/o's or need for noct saba. Also denies any obvious fluctuation of symptoms with weather or environmental changes or other aggravating or alleviating factors except as outlined above   No unusual exposure hx or h/o childhood pna/ asthma or knowledge of premature birth.  Current Allergies, Complete Past Medical History, Past Surgical History, Family History, and Social History were reviewed in [employer] record.  ROS  The following are not active complaints unless bolded Hoarseness, sore throat, dysphagia, dental  problems, itching, sneezing,  nasal congestion or discharge of excess mucus or purulent secretions, ear ache,   fever, chills, sweats, unintended wt loss or wt gain, classically pleuritic or exertional cp,  orthopnea pnd or arm/hand swelling  or leg swelling, presyncope, palpitations, abdominal pain, anorexia, nausea, vomiting, diarrhea  or change in bowel habits or change in bladder habits, change in stools or change in urine, dysuria, hematuria,  rash, arthralgias, visual complaints, headache, numbness, weakness or ataxia or problems with walking or coordination,  change in mood or  memory.        Current Meds  Medication Sig   acetaminophen (TYLENOL) 325 MG tablet Take 2 tablets (650 mg total) by mouth every 6 (six) hours as needed.   albuterol (PROAIR HFA) 108 (90 Base) MCG/ACT inhaler Inhale 2 puffs into the lungs every 6 (six) hours.   albuterol (PROVENTIL) (2.5 MG/3ML) 0.083% nebulizer solution Take 3 mLs (2.5 mg total) by nebulization every 6 (six) hours as needed for wheezing or shortness of breath.   Ascorbic Acid (VITAMIN C) 100 MG tablet Take 100 mg by mouth daily.   aspirin 81 MG tablet Take 81 mg by mouth daily.    atorvastatin (LIPITOR) 40 MG tablet TAKE 1 TABLET BY MOUTH  DAILY FOR CHOLESTEROL   budesonide-formoterol (SYMBICORT) 160-4.5 MCG/ACT inhaler USE 2 INHALATIONS BY MOUTH  TWICE DAILY   cholecalciferol (VITAMIN D) 1000 UNITS tablet Take 1,000 Units by mouth daily.    dextromethorphan-guaiFENesin (MUCINEX DM) 30-600 MG 12hr tablet Take 1 tablet by mouth 2 (two) times daily as needed for cough.   dupilumab (DUPIXENT) 300 MG/2ML prefilled syringe Inject 300 mg into the skin every 14 (fourteen) days. Starting at day 15 for maintenance.   Famotidine (PEPCID PO) Take 1 tablet by mouth daily.   folic acid (FOLVITE) 1 MG tablet Take 1 tablet (1 mg total) by mouth daily.   levothyroxine (SYNTHROID) 50 MCG tablet 1 TABLET EVERY MORNING BEFORE BREAKFAST-WAIT 1 HR AFTER  TAKING BEFORE EATING OR DRINKING   NIFEdipine (ADALAT CC) 60 MG 24 hr tablet TAKE 1 TABLET BY MOUTH  DAILY   olmesartan (BENICAR) 40 MG tablet TAKE 1 TABLET BY MOUTH  DAILY FOR BLOOD PRESSURE   triamcinolone cream (KENALOG) 0.1 %    vitamin B-12 (CYANOCOBALAMIN) 250 MCG tablet Take 250 mcg by mouth daily.                  Objective:   Physical Exam    Vital signs reviewed  02/01/2020  - Note at rest 02 sats  94% on RA    02/01/2020    217 10/05/2019    228 03/31/2019  213  01/24/2015        180 > 02/21/2015  180  > 06/16/2015  186  > 02/16/2016  205 > 09/13/2016   185 >  11/07/2016  187 >  12/21/2016  195 > 03/25/2017   200 > 06/25/2017   198  > 12/23/2017   199  > 06/13/2018  200      11/10/14 174 lb (78.926 kg)  11/08/14 176 lb 6.4 oz (80.015 kg)  10/27/14 175 lb (79.379 kg)     HEENT : pt wearing mask not removed for exam due to covid -19 concerns.    NECK :  without JVD/Nodes/TM/ nl carotid upstrokes bilaterally   LUNGS: no acc muscle use,  Mod barrel  contour chest wall with bilateral  Distant mid/late exp  wheeze and  without cough on insp or exp maneuvers and mod  Hyperresonant  to  percussion bilaterally     CV:  RRR  no s3 or murmur or increase in P2, and no edema   ABD:  soft and nontender with pos mid insp Hoover's  in the supine position. No bruits or organomegaly appreciated, bowel sounds nl  MS:     ext warm without deformities, calf tenderness, cyanosis or clubbing No obvious joint restrictions   SKIN: warm and dry without lesions    NEURO:  alert, approp, nl sensorium with  no motor or cerebellar deficits apparent.           Labs ecr 02/01/2020  :    alpha one AT phenotype           Assessment & Plan:

## 2020-02-02 ENCOUNTER — Encounter: Payer: Self-pay | Admitting: Internal Medicine

## 2020-02-02 ENCOUNTER — Encounter: Payer: Self-pay | Admitting: Family Medicine

## 2020-02-02 ENCOUNTER — Ambulatory Visit (INDEPENDENT_AMBULATORY_CARE_PROVIDER_SITE_OTHER): Payer: Medicare Other | Admitting: Family Medicine

## 2020-02-02 ENCOUNTER — Other Ambulatory Visit: Payer: Self-pay

## 2020-02-02 VITALS — BP 133/89 | HR 91 | Temp 98.4°F | Resp 22 | Ht 69.0 in | Wt 217.4 lb

## 2020-02-02 DIAGNOSIS — Z23 Encounter for immunization: Secondary | ICD-10-CM | POA: Diagnosis not present

## 2020-02-02 DIAGNOSIS — E538 Deficiency of other specified B group vitamins: Secondary | ICD-10-CM | POA: Diagnosis not present

## 2020-02-02 DIAGNOSIS — I10 Essential (primary) hypertension: Secondary | ICD-10-CM

## 2020-02-02 DIAGNOSIS — J449 Chronic obstructive pulmonary disease, unspecified: Secondary | ICD-10-CM | POA: Diagnosis not present

## 2020-02-02 DIAGNOSIS — E039 Hypothyroidism, unspecified: Secondary | ICD-10-CM | POA: Diagnosis not present

## 2020-02-02 DIAGNOSIS — E7800 Pure hypercholesterolemia, unspecified: Secondary | ICD-10-CM

## 2020-02-02 DIAGNOSIS — E782 Mixed hyperlipidemia: Secondary | ICD-10-CM | POA: Diagnosis not present

## 2020-02-02 MED ORDER — OLMESARTAN MEDOXOMIL 40 MG PO TABS: 40.0000 mg | ORAL_TABLET | Freq: Every day | ORAL | 1 refills | Status: DC

## 2020-02-02 MED ORDER — LEVOTHYROXINE SODIUM 50 MCG PO TABS: ORAL_TABLET | ORAL | 2 refills | Status: DC

## 2020-02-02 MED ORDER — ATORVASTATIN CALCIUM 40 MG PO TABS: ORAL_TABLET | ORAL | 1 refills | Status: DC

## 2020-02-02 MED ORDER — NIFEDIPINE ER 60 MG PO TB24: 60.0000 mg | ORAL_TABLET | Freq: Every day | ORAL | 1 refills | Status: DC

## 2020-02-02 NOTE — Progress Notes (Addendum)
Subjective:  Patient ID: Frank Rosales, male    DOB: Jul 30, 1955  Age: 64 y.o. MRN: [id number]  CC: Follow-up    HPI [name] presents for 62-monthfollow-up exam.  He saw pulmonary, Dr. WShyrl Numbers yesterday.  Dr. WShyrl Numbersrecommended the new breast 3 inhaler.  Patient is also using Dupixent for his eczema and Dr. WMelvyn Novastold him that was useful also for his COPD and asthma.  Patient presents for follow-up on  thyroid. The patient has a history of hypothyroidism for many years. It has been stable recently. Pt. denies any change in  voice, loss of hair, heat or cold intolerance. Energy level has been adequate to good. Patient denies constipation and diarrhea. No myxedema. Medication is as noted below. Verified that pt is taking it daily on an empty stomach. Well tolerated.   Follow-up of hypertension. Patient has no history of headache chest pain or shortness of breath or recent cough. Patient also denies symptoms of TIA such as numbness weakness lateralizing. Patient checks  blood pressure at home and has not had any elevated readings recently. Patient denies side effects from his medication. States taking it regularly.  Patient in for follow-up of elevated cholesterol. Doing well without complaints on current medication. Denies side effects of statin including myalgia and arthralgia and nausea. Also in today for liver function testing. Currently no chest pain, shortness of breath or other cardiovascular related symptoms noted. Depression screen [facility] 02/02/2020 07/21/2019 07/06/2019  Decreased Interest 0 0 0  Down, Depressed, Hopeless 0 0 0  PHQ - 2 Score 0 0 0  Altered sleeping - - -  Tired, decreased energy - - -  Change in appetite - - -  Feeling bad or failure about yourself  - - -  Trouble concentrating - - -  Moving slowly or fidgety/restless - - -  Suicidal thoughts - - -  PHQ-9 Score - - -  Some recent data might be hidden    History MYeehas a past medical history of Anxiety,  Arthritis, Asthma, Atypical nevus (03/27/2011), Atypical nevus (01/20/2007), Colon polyps, COPD (chronic obstructive pulmonary disease) ([place], High blood pressure, High cholesterol, Hilar adenopathy, Hypothyroidism, Multiple rib fractures (08/2016), Squamous cell carcinoma in situ (SCCIS) (01/09/2016), and Superficial nodular basal cell carcinoma (BCC) (01/09/2016).   He has a past surgical history that includes Knee arthroscopy (Right, 05/14/2010); Wisdom tooth extraction; epidural injections; Fixation kyphoplasty lumbar spine; Back surgery; Video assisted thoracoscopy (Left, 08/22/2016); Pleural effusion drainage (Left, 08/22/2016); and Rib plating (Left, 08/22/2016).   His family history includes Diabetes in his mother; Emphysema in his father; Heart disease in his brother; Heart failure in his mother; Lung cancer in his father.He reports that he quit smoking about 2 years ago. His smoking use included cigarettes. He has a 67.50 pack-year smoking history. He has never used smokeless tobacco. He reports current alcohol use of about 42.0 standard drinks of alcohol per week. He reports that he does not use drugs.    ROS Review of Systems  Constitutional: Positive for fatigue.  HENT: Negative.   Eyes: Negative for visual disturbance.  Respiratory: Positive for shortness of breath (This is been ongoing since his Covid infection.  He says he will feel dyspneic on exertion after walking about 60 yards on level ground.). Negative for cough.   Cardiovascular: Negative for chest pain and leg swelling.  Gastrointestinal: Negative for abdominal pain, diarrhea, nausea and vomiting.  Genitourinary: Negative for difficulty urinating.  Musculoskeletal: Positive for arthralgias.  Negative for myalgias.  Skin: Negative for rash.  Neurological: Negative for headaches.  Psychiatric/Behavioral: Negative for sleep disturbance.    Objective:  BP 133/89   Pulse 91   Temp 98.4 F (36.9 C) (Temporal)   Resp (!) 22    Ht 5' 9"  (1.753 m)   Wt 217 lb 6.4 oz (98.6 kg)   SpO2 95%   BMI 32.10 kg/m   BP Readings from Last 3 Encounters:  02/02/20 133/89  02/01/20 (!) 148/78  10/05/19 (!) 144/70    Wt Readings from Last 3 Encounters:  02/02/20 217 lb 6.4 oz (98.6 kg)  02/01/20 217 lb 12.8 oz (98.8 kg)  10/05/19 228 lb (103.4 kg)     Physical Exam Vitals reviewed.  Constitutional:      Appearance: He is well-developed.  HENT:     Head: Normocephalic and atraumatic.     Right Ear: Tympanic membrane and external ear normal. No decreased hearing noted.     Left Ear: Tympanic membrane and external ear normal. No decreased hearing noted.     Mouth/Throat:     Pharynx: No oropharyngeal exudate or posterior oropharyngeal erythema.  Eyes:     Pupils: Pupils are equal, round, and reactive to light.  Cardiovascular:     Rate and Rhythm: Normal rate and regular rhythm.     Heart sounds: No murmur heard.   Pulmonary:     Effort: No respiratory distress.     Comments: Breath Sounds distant. Decreased expiratory phase  Abdominal:     General: Bowel sounds are normal.     Palpations: Abdomen is soft. There is no mass.     Tenderness: There is no abdominal tenderness.  Musculoskeletal:     Cervical back: Normal range of motion and neck supple.       Assessment & Plan:   [name] was seen today for follow-up.  Diagnoses and all orders for this visit:  Hypothyroidism, unspecified type -     CBC with Differential/Platelet -     CMP14+EGFR -     TSH + free T4 -     levothyroxine (SYNTHROID) 50 MCG tablet; 1 TABLET EVERY MORNING BEFORE BREAKFAST-WAIT 1 HR AFTER TAKING BEFORE EATING OR DRINKING  Essential hypertension -     CBC with Differential/Platelet -     CMP14+EGFR -     NIFEdipine (ADALAT CC) 60 MG 24 hr tablet; Take 1 tablet (60 mg total) by mouth daily. -     olmesartan (BENICAR) 40 MG tablet; Take 1 tablet (40 mg total) by mouth daily. for blood pressure  COPD GOLD III  -     CBC  with Differential/Platelet -     CMP14+EGFR -     Alpha-1 antitrypsin phenotype  Mixed hyperlipidemia -     CBC with Differential/Platelet -     CMP14+EGFR -     Lipid panel  Vitamin B12 deficiency -     Vitamin B12  Need for immunization against influenza -     Flu Vaccine QUAD 36+ mos IM  High cholesterol -     atorvastatin (LIPITOR) 40 MG tablet; TAKE 1 TABLET BY MOUTH  DAILY FOR CHOLESTEROL       I have discontinued Legrand Como A. Whisman's dextromethorphan-guaiFENesin. I have also changed his NIFEdipine and olmesartan. Additionally, I am having him maintain his aspirin, cholecalciferol, vitamin P-49, folic acid, acetaminophen, albuterol, triamcinolone cream, Famotidine (PEPCID PO), vitamin C, albuterol, Dupixent, Breztri Aerosphere, atorvastatin, and levothyroxine.  Allergies as of 02/02/2020  Reactions   No Known Allergies       Medication List       Accurate as of February 02, 2020 10:17 PM. If you have any questions, ask your nurse or doctor.        STOP taking these medications   dextromethorphan-guaiFENesin 30-600 MG 12hr tablet Commonly known as: [place] DM Stopped by: Claretta Fraise, MD     TAKE these medications   acetaminophen 325 MG tablet Commonly known as: TYLENOL Take 2 tablets (650 mg total) by mouth every 6 (six) hours as needed.   albuterol (2.5 MG/3ML) 0.083% nebulizer solution Commonly known as: PROVENTIL Take 3 mLs (2.5 mg total) by nebulization every 6 (six) hours as needed for wheezing or shortness of breath.   albuterol 108 (90 Base) MCG/ACT inhaler Commonly known as: ProAir HFA Inhale 2 puffs into the lungs every 6 (six) hours.   aspirin 81 MG tablet Take 81 mg by mouth daily.   atorvastatin 40 MG tablet Commonly known as: LIPITOR TAKE 1 TABLET BY MOUTH  DAILY FOR CHOLESTEROL   Breztri Aerosphere 160-9-4.8 MCG/ACT Aero Generic drug: Budeson-Glycopyrrol-Formoterol Inhale 2 puffs into the lungs 2 (two) times daily.     cholecalciferol 1000 units tablet Commonly known as: VITAMIN D Take 1,000 Units by mouth daily.   Dupixent 300 MG/2ML prefilled syringe Generic drug: dupilumab Inject 300 mg into the skin every 14 (fourteen) days. Starting at day 15 for maintenance.   folic acid 1 MG tablet Commonly known as: FOLVITE Take 1 tablet (1 mg total) by mouth daily.   levothyroxine 50 MCG tablet Commonly known as: SYNTHROID 1 TABLET EVERY MORNING BEFORE BREAKFAST-WAIT 1 HR AFTER TAKING BEFORE EATING OR DRINKING   NIFEdipine 60 MG 24 hr tablet Commonly known as: ADALAT CC Take 1 tablet (60 mg total) by mouth daily.   olmesartan 40 MG tablet Commonly known as: BENICAR Take 1 tablet (40 mg total) by mouth daily. for blood pressure   PEPCID PO Take 1 tablet by mouth daily.   triamcinolone cream 0.1 % Commonly known as: KENALOG   vitamin B-12 250 MCG tablet Commonly known as: CYANOCOBALAMIN Take 250 mcg by mouth daily.   vitamin C 100 MG tablet Take 100 mg by mouth daily.        Follow-up: No follow-ups on file.  Claretta Fraise, M.D.

## 2020-02-02 NOTE — Assessment & Plan Note (Addendum)
Quit smoking Spirometry 11/2017   03/2011 FeV1 49%    Spirometry  02/28/2012  FEV1 45%   - Last day worked = October 13 2014  - PFT's  01/24/2015  FEV1 1.59  (43 % ) ratio 54   p 22 % improvement from saba with DLCO  88 % corrects to 99 % for alv volume s symbicort  - PFTs  12/27/15 1.30 (35%) ratio 35  In midst of a flare- - PFT's  12/21/2016  FEV1 1.40 (39 % ) ratio 50  p 8 % improvement from saba p symbicort 160 x 2  prior to study with DLCO  71/69 % corrects to 85  % for alv volume - PFTS  05/16/17  FEV1  1.32( 37%)  Ratio 37  And DLCO  52% > corrects to 78%  03/25/2017  try bevespi > preferred symb 160  - 06/13/2018    try adding spiriva 2.5 x 2 each am sample  > no better so did not fill rx  - 03/31/2019   Walked RA x one lap =  approx 250 ft @ mod pace - stopped due to sob with sats of 92% at the end of the study. - 02/01/2020  After extensive coaching inhaler device,  effectiveness =  90%  - alpha one AT def screen rec 02/01/2020      Group D in terms of symptom/risk and laba/lama/ICS  therefore appropriate rx at this point >>>  rechallenge with breztri 2 bid and approp use of saba  I spent extra time with pt today reviewing appropriate use of albuterol for prn use on exertion with the following points: 1) saba is for relief of sob that does not improve by walking a slower pace or resting but rather if the pt does not improve after trying this first. 2) If the pt is convinced, as many are, that saba helps recover from activity faster then it's easy to tell if this is the case by re-challenging : ie stop, take the inhaler, then p 5 minutes try the exact same activity (intensity of workload) that just caused the symptoms and see if they are substantially diminished or not after saba 3) if there is an activity that reproducibly causes the symptoms, try the saba 15 min before the activity on alternate days   If in fact the saba really does help, then fine to continue to use it prn but advised may need  to look closer at the maintenance regimen being used to achieve better control of airways disease with exertion.    Will give 4 weeks samples of breztri using house to shed as indicator of improving doe vs not from addition of lama since not convinced in past that [place] helped.         Each maintenance medication was reviewed in detail including emphasizing most importantly the difference between maintenance and prns and under what circumstances the prns are to be triggered using an action plan format where appropriate.  Total time for H and P, chart review, counseling, teaching device and generating customized AVS unique to this office visit / charting  > 30 min

## 2020-02-03 LAB — CBC WITH DIFFERENTIAL/PLATELET
Basophils Absolute: 0.1 10*3/uL (ref 0.0–0.2)
Basos: 1 %
EOS (ABSOLUTE): 0.1 10*3/uL (ref 0.0–0.4)
Eos: 1 %
Hematocrit: 48.3 % (ref 37.5–51.0)
Hemoglobin: 17 g/dL (ref 13.0–17.7)
Immature Grans (Abs): 0 10*3/uL (ref 0.0–0.1)
Immature Granulocytes: 0 %
Lymphocytes Absolute: 1.3 10*3/uL (ref 0.7–3.1)
Lymphs: 12 %
MCH: 36.2 pg — ABNORMAL HIGH (ref 26.6–33.0)
MCHC: 35.2 g/dL (ref 31.5–35.7)
MCV: 103 fL — ABNORMAL HIGH (ref 79–97)
Monocytes Absolute: 0.8 10*3/uL (ref 0.1–0.9)
Monocytes: 7 %
Neutrophils Absolute: 8.6 10*3/uL — ABNORMAL HIGH (ref 1.4–7.0)
Neutrophils: 79 %
Platelets: 289 10*3/uL (ref 150–450)
RBC: 4.7 x10E6/uL (ref 4.14–5.80)
RDW: 12.4 % (ref 11.6–15.4)
WBC: 10.9 10*3/uL — ABNORMAL HIGH (ref 3.4–10.8)

## 2020-02-03 LAB — CMP14+EGFR
ALT: 48 IU/L — ABNORMAL HIGH (ref 0–44)
AST: 37 IU/L (ref 0–40)
Albumin/Globulin Ratio: 1.7 (ref 1.2–2.2)
Albumin: 4.7 g/dL (ref 3.8–4.8)
Alkaline Phosphatase: 126 IU/L — ABNORMAL HIGH (ref 44–121)
BUN/Creatinine Ratio: 10 (ref 10–24)
BUN: 10 mg/dL (ref 8–27)
Bilirubin Total: 0.7 mg/dL (ref 0.0–1.2)
CO2: 20 mmol/L (ref 20–29)
Calcium: 9.7 mg/dL (ref 8.6–10.2)
Chloride: 97 mmol/L (ref 96–106)
Creatinine, Ser: 0.97 mg/dL (ref 0.76–1.27)
GFR calc Af Amer: 96 mL/min/{1.73_m2} (ref >59)
GFR calc non Af Amer: 83 mL/min/{1.73_m2} (ref >59)
Globulin, Total: 2.8 g/dL (ref 1.5–4.5)
Glucose: 145 mg/dL — ABNORMAL HIGH (ref 65–99)
Potassium: 4.4 mmol/L (ref 3.5–5.2)
Sodium: 137 mmol/L (ref 134–144)
Total Protein: 7.5 g/dL (ref 6.0–8.5)

## 2020-02-03 LAB — LIPID PANEL
Chol/HDL Ratio: 2.7 ratio (ref 0.0–5.0)
Cholesterol, Total: 142 mg/dL (ref 100–199)
HDL: 53 mg/dL (ref >39)
LDL Chol Calc (NIH): 65 mg/dL (ref 0–99)
Triglycerides: 137 mg/dL (ref 0–149)
VLDL Cholesterol Cal: 24 mg/dL (ref 5–40)

## 2020-02-03 LAB — VITAMIN B12: Vitamin B-12: 693 pg/mL (ref 232–1245)

## 2020-02-03 LAB — ALPHA-1 ANTITRYPSIN PHENOTYPE: A-1 Antitrypsin: 151 mg/dL (ref 101–187)

## 2020-02-03 LAB — TSH+FREE T4
Free T4: 1.4 ng/dL (ref 0.82–1.77)
TSH: 3.14 u[IU]/mL (ref 0.450–4.500)

## 2020-02-04 NOTE — Progress Notes (Signed)
Hello Jeramih,  Your lab result is normal and/or stable.Some minor variations that are not significant are commonly marked abnormal, but do not represent any medical problem for you.  Best regards, Claretta Fraise, M.D.

## 2020-02-08 ENCOUNTER — Telehealth: Payer: Self-pay | Admitting: *Deleted

## 2020-02-08 NOTE — Telephone Encounter (Signed)
Receiived refill request for Dupixent- denied patient needs office visit

## 2020-02-11 ENCOUNTER — Telehealth: Payer: Self-pay | Admitting: Dermatology

## 2020-02-11 NOTE — Telephone Encounter (Signed)
Left message for patient we need him to schedule office visit before any more refills can be given for his Dupixent.

## 2020-02-11 NOTE — Telephone Encounter (Signed)
[facility] left message on office voice mail that they need a new prescription for Trestan.  [facility]'s phone number is [phone] Ext [phone] and the fax number is [phone].

## 2020-02-18 ENCOUNTER — Other Ambulatory Visit: Payer: Self-pay

## 2020-02-18 ENCOUNTER — Ambulatory Visit: Payer: Medicare Other | Admitting: Dermatology

## 2020-02-18 ENCOUNTER — Encounter: Payer: Self-pay | Admitting: Dermatology

## 2020-02-18 DIAGNOSIS — D485 Neoplasm of uncertain behavior of skin: Secondary | ICD-10-CM

## 2020-02-18 DIAGNOSIS — C44329 Squamous cell carcinoma of skin of other parts of face: Secondary | ICD-10-CM | POA: Diagnosis not present

## 2020-02-18 DIAGNOSIS — C4491 Basal cell carcinoma of skin, unspecified: Secondary | ICD-10-CM

## 2020-02-18 DIAGNOSIS — C4441 Basal cell carcinoma of skin of scalp and neck: Secondary | ICD-10-CM | POA: Diagnosis not present

## 2020-02-18 DIAGNOSIS — C44321 Squamous cell carcinoma of skin of nose: Secondary | ICD-10-CM | POA: Diagnosis not present

## 2020-02-18 DIAGNOSIS — L2084 Intrinsic (allergic) eczema: Secondary | ICD-10-CM | POA: Diagnosis not present

## 2020-02-18 DIAGNOSIS — C4492 Squamous cell carcinoma of skin, unspecified: Secondary | ICD-10-CM

## 2020-02-18 DIAGNOSIS — C44319 Basal cell carcinoma of skin of other parts of face: Secondary | ICD-10-CM | POA: Diagnosis not present

## 2020-02-18 HISTORY — DX: Squamous cell carcinoma of skin, unspecified: C44.92

## 2020-02-18 HISTORY — DX: Basal cell carcinoma of skin, unspecified: C44.91

## 2020-02-18 NOTE — Patient Instructions (Signed)

## 2020-02-19 ENCOUNTER — Other Ambulatory Visit: Payer: Self-pay | Admitting: *Deleted

## 2020-02-19 MED ORDER — DUPIXENT 300 MG/2ML ~~LOC~~ SOSY: 300.0000 mg | PREFILLED_SYRINGE | SUBCUTANEOUS | 3 refills | Status: DC

## 2020-02-29 ENCOUNTER — Encounter: Payer: Self-pay | Admitting: Dermatology

## 2020-02-29 NOTE — Telephone Encounter (Signed)
Phone call to patient with his pathology results. Voicemail left for patient to give the office a call back.  ?

## 2020-02-29 NOTE — Telephone Encounter (Signed)
-----   Message from Lavonna Monarch, MD sent at 02/29/2020  6:56 AM EDT ----- Although would like to schedule Frank Rosales as my last surgery of the morning or afternoon, will discuss at that time which of these would better be treated with Mohs surgery.

## 2020-02-29 NOTE — Telephone Encounter (Signed)
Phone call from patient returning our call. Pathology results given to patient.  

## 2020-03-03 ENCOUNTER — Other Ambulatory Visit: Payer: Self-pay

## 2020-03-03 ENCOUNTER — Ambulatory Visit: Payer: Medicare Other | Admitting: Internal Medicine

## 2020-03-03 ENCOUNTER — Encounter: Payer: Self-pay | Admitting: Internal Medicine

## 2020-03-03 DIAGNOSIS — J449 Chronic obstructive pulmonary disease, unspecified: Secondary | ICD-10-CM

## 2020-03-03 NOTE — Patient Instructions (Signed)
No change in medications    Please schedule a follow up visit in 6  months but call sooner if needed  

## 2020-03-03 NOTE — Assessment & Plan Note (Addendum)
Quit smoking Spirometry 11/2017   03/2011 FeV1 49%    Spirometry  02/28/2012  FEV1 45%   - Last day worked = October 13 2014  - PFT's  01/24/2015  FEV1 1.59  (43 % ) ratio 54   p 22 % improvement from saba with DLCO  88 % corrects to 99 % for alv volume s symbicort  - PFTs  12/27/15 1.30 (35%) ratio 35  In midst of a flare- - PFT's  12/21/2016  FEV1 1.40 (39 % ) ratio 50  p 8 % improvement from saba p symbicort 160 x 2  prior to study with DLCO  71/69 % corrects to 85  % for alv volume - PFTS  05/16/17  FEV1  1.32( 37%)  Ratio 37  And DLCO  52% > corrects to 78%  03/25/2017  try bevespi > preferred symb 160  - 06/13/2018    try adding spiriva 2.5 x 2 each am sample  > no better so did not fill rx  - 03/31/2019   Walked RA x one lap =  approx 250 ft @ mod pace - stopped due to sob with sats of 92% at the end of the study. - 02/01/2020  After extensive coaching inhaler device,  effectiveness =  90%  - alpha one AT def screen  02/02/20   MM  Level 151     Group D in terms of symptom/risk and laba/lama/ICS  therefore appropriate rx at this point >>>  Continue breztri  Re saba I spent extra time with pt today reviewing appropriate use of albuterol for prn use on exertion with the following points: 1) saba is for relief of sob that does not improve by walking a slower pace or resting but rather if the pt does not improve after trying this first. 2) If the pt is convinced, as many are, that saba helps recover from activity faster then it's easy to tell if this is the case by re-challenging : ie stop, take the inhaler, then p 5 minutes try the exact same activity (intensity of workload) that just caused the symptoms and see if they are substantially diminished or not after saba 3) if there is an activity that reproducibly causes the symptoms, try the saba 15 min before the activity on alternate days   If in fact the saba really does help, then fine to continue to use it prn but advised may need to look closer at  the maintenance regimen being used to achieve better control of airways disease with exertion.           Each maintenance medication was reviewed in detail including emphasizing most importantly the difference between maintenance and prns and under what circumstances the prns are to be triggered using an action plan format where appropriate.  Total time for H and P, chart review, counseling, teaching device and generating customized AVS unique to this office visit / charting = 25 min

## 2020-03-03 NOTE — Progress Notes (Signed)
Subjective:   Patient ID: Frank Rosales, male    DOB: 01/09/1956    MRN: [id number]    Brief patient profile:  60  yowm/MM "quit smoking"  11/2017  with h/o exposure to Asbestosis at [place] ? Into the 80s  still able to walk  flat fine but steps x 3 flights sob with GOLD III copd 2013 prev eval by Dr Joya Gaskins  / gets annual f/u in [place]     History of Present Illness  02/16/2016  f/u ov/Crist Kruszka re:  GOLD III copd /  symbicort 160 2bid / twice weekly saba  Chief Complaint  Patient presents with  . Follow-up    Pt states had CT Chest done 12/27/15- ordered by worker's comp for eval of previous asbestos exp. He states he had been doing well until July 2017 developed cough and congestion that lasted 2 months, but starting to improve.   sick July - September 2017 rx with abx/ steroids better since early Sept 2017 but CT was done in middle of this illness and was abnormal but was done for purpose of screening for asbestos  rec Plan A = Automatic = symbicort 160 Take 2 puffs first thing in am and then another 2 puffs about 12 hours later.  Work on inhaler technique:   Only use your albuterol(proair)  as a rescue medication  The key is to stop smoking completely before smoking completely stops you!      11/07/2016  f/u ov/Ila Landowski re: acute cough - maintained on symb 160 2bid  Chief Complaint  Patient presents with  . Follow-up    Pt c/o chest and nasal congestion, cough, increased SOB for the past wk. His cough is prod with very thick, clear sputum. He states "feels like I'm drowning"-seen by PCP on 6/22 and given steroid inj and abx with no relief.   acute onset with sore thorat gone w/in first day but then nasal / congestion rx by Dr Quinn Axe on 11/02/16 with pred/levaquin Mucus is now clear  Using saba 2 4 x daily  rec Plan A = Automatic = Symbicort 160  Take 2 puffs first thing in am and then another 2 puffs about 12 hours later.  Plan B = Backup Only use your albuterol as a rescue  medication Prednisone 10 mg take  4 each am x 2 days,   2 each am x 2 days,  1 each am x 2 days and stop  Try prilosec otc 62m  Take 30-60 min before first meal of the day and Pepcid ac (famotidine) 20 mg one @  bedtime until cough is completely gone for at least a week without the need for cough suppression and stop the krill oil for the same reason until no cough at all  For cough /congestion > mucinex dm up to 1200 mg every 12 hours as needed and tramadol 50 mg up to 2 every 4 hours  For nasal congestion;  advil cold and sinus congestion every 4 hours as needed    12/23/2017  f/u ov/Maaliyah Adolph re:   COPD III/ stopped smoking  July 2019  Chief Complaint  Patient presents with  . Follow-up    He c/o non prod cough- esp worse in the am's. Breathing is about the same. He has completely stopped smoking. He is using his rescue inhaler about once per wk.   Dyspnea:  MMRC3 = can't walk 100 yards even at a slow pace at a flat grade s stopping due  to sob   Cough: very harsh only with activity indoor vs outdoor the same/ uses mucinex dm ? Dose   Sleeping: ok p otc sleep aide on 2 pillows SABA use: rare 02: none   rec Stop fish oil when coughing   06/13/2018 acute extended  ov/Ronelle Michie re:  Copd III/ still off cigs/ on pred for rash but down to 5 mg daily  Chief Complaint  Patient presents with  . Acute Visit    Pt states getting over bronchitis that he developed about a month. He has cough with minimal clear to somtimes green sputum.  He states he was txed with levaquin and a steroid injection. He rarely uses his rescue inhaler or neb.    Dyspnea:  Back to baseline  > back bothers him more than breathing most of the time  Cough: worse mid morning still green p levaquin Sleeping: on back / 2 pillows  SABA use:  2-3 x per month rec Add on spiriva 2.5 x 2 pff each am  Work on inhaler technique:  Augmentin 875 mg take one pill twice daily  X 10 days  > transiently better    06/27/2018  f/u ov/Makarios Madlock re:  copd III/ not convinced better on spiriva vs off  Chief Complaint  Patient presents with  . Follow-up    Pt states he is about the same since last visit. Has complaints of cough with green phlegm and SOB with exertion.  Dyspnea:  MMRC3 = can't walk 100 yards even at a slow pace at a flat grade s stopping due to sob   Cough: worse in am  Assoc with nasal congestion  Sleeping: on back/ 2 pillows  SABA use: rarely  rec Prednisone 10 mg take  4 each am x 2 days,   2 each am x 2 days,  1 each am x 2 days and stop  Augmentin 875 mg take one pill twice daily  X 10 days - take at breakfast and supper with large glass of water.  It would help reduce the usual side effects (diarrhea and yeast infections) if you ate cultured yogurt at lunch.  Mucinex dm up to 1200  every 12 hours as needed  Adding pepcid 20 mg one at bedtime for 2 weeks to see if helps your am cough   Please schedule a follow up office visit in 6 weeks, call sooner if needed with all medications /inhalers/ solutions in hand so we can verify exactly what you are taking. This includes all medications from all doctors and over the counters    televist 11/13/18 no change recs    03/31/2019  f/u ov/Keierra Nudo re:  GOLD III  No better on spiriva  Chief Complaint  Patient presents with  . Follow-up    breathing is "not too bad"- using proair and neb both rarely.   Dyspnea:  Only time he leaves the house is to go to doctor/ one flight and stops at top Cough: better, non-productive  Sleeping: on back or side with bed 2 pillows  SABA use: rarely  02: none  rec Plan A = Automatic = Always=    symbicort 160 (or Breztri not both) Take 2 puffs first thing in am and then another 2 puffs about 12 hours later.  Plan B = Backup (to supplement plan A, not to replace it) Only use your albuterol inhaler as a rescue medication Plan C = Crisis (instead of Plan B but only if Plan B stops working) -  only use your albuterol nebulizer if you first try Plan B  and it fails to help     10/05/2019  f/u ov/Jenean Escandon re:  GOLD III prefers symbicort /on  dupixent since spring/summer 2020 per [facility]  Patient presents with  . Follow-up    Coughing more and feeling more SOB "with the pollen"- clear sputum. He uses his albuterol inhaler about once per wk and never uses neb.   Dyspnea:  Very sedentary, gaining wt  Cough: more congested thicker x 2-3 weeks  Sleeping: on back 2 pillows  SABA use: as above  02: 02  Rec For cough/ congestion :  mucinex or mucinex dm up to 1200 mg every 12 hours  Depomedrol 120 mg IM     02/01/2020  f/u ov/[facility]/Dorean Hiebert re: GOLD III copd maint on symbicort/ dupixent per derm Chief Complaint  Patient presents with  . Follow-up    Patient feels like his breathing has got worse since last visit. Struggles to breath with any exertion. Cough in morning with clear sputum.  Dyspnea:  Variable doe/ worse in shower / also limited by back pain which is chronic  Walks to shop x 50 yards [place] coming back to house  Cough: some in am better p coffee  Sleeping: bed is flat/ two pillows  SABA use: once a week 02: none rec Try albuterol 15 min before an activity that you know would make you short of breath and see if it makes any difference and if makes none then don't take it after activity unless you can't catch your breath.  Plan A = Automatic = Always=    Change to Breztri Take 2 puffs first thing in am and then another 2 puffs about 12 hours later.  Plan B = Backup (to supplement plan A, not to replace it) Only use your albuterol inhaler as a rescue medication Plan C = Crisis (instead of Plan B but only if Plan B stops working) - only use your albuterol nebulizer if you first try Plan B and it fails to help > ok to use the nebulizer up to every 4 hours but if start needing it regularly call for immediate appointment   03/03/2020  f/u ov/[facility]/Kavi Almquist re: copd III/ maint breztri 2 in am / symb  160 2 in pm until uses up symbicort Chief Complaint  Patient presents with  . Follow-up    Breathing is slightly better since the last visit. He has only used his albuterol inhaler prior to activity. He still has prod cough- clear sputum. He states he is using his Breztri 2 puffs am and Symbicort 2 puffs pm.   Dyspnea:  Walks to shop ? Slt easier, limited by back also   Cough:  Min in am  Sleeping: flat bed / 2 pillows  SABA use: none  02: none    No obvious day to day or daytime variability or assoc excess/ purulent sputum or mucus plugs or hemoptysis or cp or chest tightness, subjective wheeze or overt sinus or hb symptoms.   sleeping without nocturnal  or early am exacerbation  of respiratory  c/o's or need for noct saba. Also denies any obvious fluctuation of symptoms with weather or environmental changes or other aggravating or alleviating factors except as outlined above   No unusual exposure hx or h/o childhood pna/ asthma or knowledge of premature birth.  Current Allergies, Complete Past Medical History, Past Surgical History, Family History, and Social History  were reviewed in [facility] record.  ROS  The following are not active complaints unless bolded Hoarseness, sore throat, dysphagia, dental problems, itching, sneezing,  nasal congestion or discharge of excess mucus or purulent secretions, ear ache,   fever, chills, sweats, unintended wt loss or wt gain, classically pleuritic or exertional cp,  orthopnea pnd or arm/hand swelling  or leg swelling, presyncope, palpitations, abdominal pain, anorexia, nausea, vomiting, diarrhea  or change in bowel habits or change in bladder habits, change in stools or change in urine, dysuria, hematuria,  rash, arthralgias, visual complaints, headache, numbness, weakness or ataxia or problems with walking or coordination,  change in mood or  memory.        Current Meds  Medication Sig  . acetaminophen (TYLENOL) 325 MG  tablet Take 2 tablets (650 mg total) by mouth every 6 (six) hours as needed.  Marland Kitchen albuterol (PROAIR HFA) 108 (90 Base) MCG/ACT inhaler Inhale 2 puffs into the lungs every 6 (six) hours.  Marland Kitchen albuterol (PROVENTIL) (2.5 MG/3ML) 0.083% nebulizer solution Take 3 mLs (2.5 mg total) by nebulization every 6 (six) hours as needed for wheezing or shortness of breath.  . Ascorbic Acid (VITAMIN C) 100 MG tablet Take 100 mg by mouth daily.  Marland Kitchen aspirin 81 MG tablet Take 81 mg by mouth daily.   Marland Kitchen atorvastatin (LIPITOR) 40 MG tablet TAKE 1 TABLET BY MOUTH  DAILY FOR CHOLESTEROL  . Budeson-Glycopyrrol-Formoterol (BREZTRI AEROSPHERE) 160-9-4.8 MCG/ACT AERO Inhale 2 puffs into the lungs 2 (two) times daily. (Patient taking differently: Inhale 2 puffs into the lungs every morning. )  . cholecalciferol (VITAMIN D) 1000 UNITS tablet Take 1,000 Units by mouth daily.   . dupilumab (DUPIXENT) 300 MG/2ML prefilled syringe Inject 300 mg into the skin every 14 (fourteen) days. Starting at day 15 for maintenance.  . Famotidine (PEPCID PO) Take 1 tablet by mouth daily.  . folic acid (FOLVITE) 1 MG tablet Take 1 tablet (1 mg total) by mouth daily.  Marland Kitchen levothyroxine (SYNTHROID) 50 MCG tablet 1 TABLET EVERY MORNING BEFORE BREAKFAST-WAIT 1 HR AFTER TAKING BEFORE EATING OR DRINKING  . NIFEdipine (ADALAT CC) 60 MG 24 hr tablet Take 1 tablet (60 mg total) by mouth daily.  Marland Kitchen olmesartan (BENICAR) 40 MG tablet Take 1 tablet (40 mg total) by mouth daily. for blood pressure  . SYMBICORT 160-4.5 MCG/ACT inhaler Inhale 2 puffs into the lungs at bedtime.  . triamcinolone cream (KENALOG) 0.1 %   . vitamin B-12 (CYANOCOBALAMIN) 250 MCG tablet Take 250 mcg by mouth daily.                          Objective:   Physical Exam   amb wm nad    03/03/2020  219  02/01/2020    217 10/05/2019    228 03/31/2019  213  01/24/2015        180 > 02/21/2015  180  > 06/16/2015  186  > 02/16/2016  205 > 09/13/2016   185 >  11/07/2016  187 >  12/21/2016  195  > 03/25/2017   200 > 06/25/2017   198  > 12/23/2017   199  > 06/13/2018  200      11/10/14 174 lb (78.926 kg)  11/08/14 176 lb 6.4 oz (80.015 kg)  10/27/14 175 lb (79.379 kg)     Vital signs reviewed  03/03/2020  - Note at rest 02 sats  94% on RA  HEENT : pt wearing mask not removed for exam due to covid -19 concerns.    NECK :  without JVD/Nodes/TM/ nl carotid upstrokes bilaterally   LUNGS: no acc muscle use,  Mod barrel  contour chest wall with bilateral  Distant bs s audible wheeze and  without cough on insp or exp maneuvers and mod  Hyperresonant  to  percussion bilaterally     CV:  RRR  no s3 or murmur or increase in P2, and no edema   ABD:  Obese/ soft and nontender with pos mid insp Hoover's  in the supine position. No bruits or organomegaly appreciated, bowel sounds nl  MS:     ext warm without deformities, calf tenderness, cyanosis or clubbing No obvious joint restrictions   SKIN: warm and dry without lesions    NEURO:  alert, approp, nl sensorium with  no motor or cerebellar deficits apparent.                  Assessment & Plan:

## 2020-03-08 ENCOUNTER — Other Ambulatory Visit: Payer: Self-pay | Admitting: *Deleted

## 2020-03-08 DIAGNOSIS — E7800 Pure hypercholesterolemia, unspecified: Secondary | ICD-10-CM

## 2020-03-08 DIAGNOSIS — J449 Chronic obstructive pulmonary disease, unspecified: Secondary | ICD-10-CM

## 2020-03-08 DIAGNOSIS — I10 Essential (primary) hypertension: Secondary | ICD-10-CM

## 2020-03-08 DIAGNOSIS — E039 Hypothyroidism, unspecified: Secondary | ICD-10-CM

## 2020-03-08 MED ORDER — ALBUTEROL SULFATE HFA 108 (90 BASE) MCG/ACT IN AERS: 2.0000 | INHALATION_SPRAY | Freq: Four times a day (QID) | RESPIRATORY_TRACT | 1 refills | Status: DC

## 2020-03-08 MED ORDER — NIFEDIPINE ER 60 MG PO TB24: 60.0000 mg | ORAL_TABLET | Freq: Every day | ORAL | 1 refills | Status: DC

## 2020-03-08 MED ORDER — OLMESARTAN MEDOXOMIL 40 MG PO TABS: 40.0000 mg | ORAL_TABLET | Freq: Every day | ORAL | 1 refills | Status: DC

## 2020-03-08 MED ORDER — LEVOTHYROXINE SODIUM 50 MCG PO TABS: ORAL_TABLET | ORAL | 2 refills | Status: DC

## 2020-03-08 MED ORDER — ATORVASTATIN CALCIUM 40 MG PO TABS: ORAL_TABLET | ORAL | 1 refills | Status: DC

## 2020-03-09 MED ORDER — SYMBICORT 160-4.5 MCG/ACT IN AERO: 2.0000 | INHALATION_SPRAY | Freq: Every day | RESPIRATORY_TRACT | 3 refills | Status: DC

## 2020-03-16 ENCOUNTER — Telehealth: Payer: Self-pay | Admitting: *Deleted

## 2020-03-16 NOTE — Telephone Encounter (Signed)
[facility]up[facility] needs new Rx Dupixent- faxed new prescription to [phone]

## 2020-03-27 ENCOUNTER — Encounter: Payer: Self-pay | Admitting: Dermatology

## 2020-03-27 NOTE — Progress Notes (Signed)
   Follow-Up Visit   Subjective  Frank Rosales is a 64 y.o. male who presents for the following: Follow-up (dupixent helps no flare).  Growths Location: Face Duration:  Quality:  Associated Signs/Symptoms: Modifying Factors:  Severity:  Timing: Context:   Objective  Well appearing patient in no apparent distress; mood and affect are within normal limits.  All skin waist up examined.   Assessment & Plan    Neoplasm of uncertain behavior of skin (4) Left Nasal Sidewall  Skin / nail biopsy Type of biopsy: tangential   Informed consent: discussed and consent obtained   Timeout: patient name, date of birth, surgical site, and procedure verified   Procedure prep:  Patient was prepped and draped in usual sterile fashion (Non sterile) Prep type:  Chlorhexidine Anesthesia: the lesion was anesthetized in a standard fashion   Anesthetic:  1% lidocaine w/ epinephrine 1-100,000 local infiltration Instrument used: flexible razor blade   Outcome: patient tolerated procedure well   Post-procedure details: wound care instructions given    Specimen 1 - Surgical pathology Differential Diagnosis: scc vs bcc Check Margins: No  Right Temple  Skin / nail biopsy Type of biopsy: tangential   Informed consent: discussed and consent obtained   Timeout: patient name, date of birth, surgical site, and procedure verified   Procedure prep:  Patient was prepped and draped in usual sterile fashion (Non sterile) Prep type:  Chlorhexidine Anesthesia: the lesion was anesthetized in a standard fashion   Anesthetic:  1% lidocaine w/ epinephrine 1-100,000 local infiltration Instrument used: flexible razor blade   Outcome: patient tolerated procedure well   Post-procedure details: wound care instructions given    Specimen 2 - Surgical pathology Differential Diagnosis: scc vs bcc Check Margins: No  Left Temple  Skin / nail biopsy Type of biopsy: tangential   Informed consent: discussed and  consent obtained   Timeout: patient name, date of birth, surgical site, and procedure verified   Procedure prep:  Patient was prepped and draped in usual sterile fashion (Non sterile) Prep type:  Chlorhexidine Anesthesia: the lesion was anesthetized in a standard fashion   Anesthetic:  1% lidocaine w/ epinephrine 1-100,000 local infiltration Instrument used: flexible razor blade   Outcome: patient tolerated procedure well   Post-procedure details: wound care instructions given    Specimen 4 - Surgical pathology Differential Diagnosis: scc vs bcc Check Margins: No  Left Zygomatic Area  Skin / nail biopsy Type of biopsy: tangential   Informed consent: discussed and consent obtained   Timeout: patient name, date of birth, surgical site, and procedure verified   Anesthesia: the lesion was anesthetized in a standard fashion   Anesthetic:  1% lidocaine w/ epinephrine 1-100,000 local infiltration Instrument used: flexible razor blade   Hemostasis achieved with: ferric subsulfate   Outcome: patient tolerated procedure well   Post-procedure details: sterile dressing applied and wound care instructions given   Dressing type: bandage and petrolatum    Skin / nail biopsy      I, Lavonna Monarch, MD, have reviewed all documentation for this visit.  The documentation on 03/27/20 for the exam, diagnosis, procedures, and orders are all accurate and complete.

## 2020-04-27 ENCOUNTER — Ambulatory Visit: Payer: Medicare Other | Admitting: Dermatology

## 2020-05-05 ENCOUNTER — Other Ambulatory Visit: Payer: Self-pay

## 2020-05-05 ENCOUNTER — Ambulatory Visit (INDEPENDENT_AMBULATORY_CARE_PROVIDER_SITE_OTHER): Payer: Medicare Other | Admitting: Dermatology

## 2020-05-05 DIAGNOSIS — C44329 Squamous cell carcinoma of skin of other parts of face: Secondary | ICD-10-CM

## 2020-05-05 DIAGNOSIS — L821 Other seborrheic keratosis: Secondary | ICD-10-CM | POA: Diagnosis not present

## 2020-05-05 DIAGNOSIS — D485 Neoplasm of uncertain behavior of skin: Secondary | ICD-10-CM | POA: Diagnosis not present

## 2020-05-05 DIAGNOSIS — C4492 Squamous cell carcinoma of skin, unspecified: Secondary | ICD-10-CM

## 2020-05-05 NOTE — Progress Notes (Signed)
Left nasal sidewall, right temple and left temporal scalp not treated today.  Patient will schedule end of morning or afternoon surgery.

## 2020-05-05 NOTE — Patient Instructions (Signed)

## 2020-05-19 ENCOUNTER — Telehealth: Payer: Self-pay | Admitting: *Deleted

## 2020-05-19 MED ORDER — DUPIXENT 300 MG/2ML ~~LOC~~ SOSY: 300.0000 mg | PREFILLED_SYRINGE | SUBCUTANEOUS | 3 refills | Status: DC

## 2020-05-19 NOTE — Telephone Encounter (Signed)
Patient called about his Dupixent. I told him we did prior authorization this morning it was approved and I sent new prescription to [facility] pharmacy for the prefilled syringes because prefers that over pen. Told him to call back if he had anymore issues.

## 2020-05-19 NOTE — Telephone Encounter (Signed)
Prior authorization done via Cover My Meds for patients Dupixent. Waiting on determination.

## 2020-06-28 ENCOUNTER — Telehealth: Payer: Self-pay | Admitting: *Deleted

## 2020-06-28 NOTE — Telephone Encounter (Signed)
Patient called stating that he was having trouble getting Dupixent- I called [place] [facility], [address] (Ph: [phone]. Patient needs to call and schedule delivery. I called to inform patient to call.  Patient understood.

## 2020-07-07 ENCOUNTER — Encounter: Payer: Medicare Other | Admitting: Dermatology

## 2020-07-14 ENCOUNTER — Telehealth: Payer: Self-pay

## 2020-07-14 NOTE — Telephone Encounter (Signed)
Fax received from [facility] stating that the patient is enrolled in the patient assistance program and he will receive Dupixent at no cost through 05/13/2021.

## 2020-07-27 NOTE — Telephone Encounter (Signed)
error 

## 2020-07-28 NOTE — Telephone Encounter (Signed)
This is an open encounter.  Can someone finish it so I can close it?  Thank you!

## 2020-08-01 ENCOUNTER — Encounter: Payer: Self-pay | Admitting: Family Medicine

## 2020-08-01 ENCOUNTER — Other Ambulatory Visit: Payer: Self-pay

## 2020-08-01 ENCOUNTER — Ambulatory Visit (INDEPENDENT_AMBULATORY_CARE_PROVIDER_SITE_OTHER): Payer: Medicare Other | Admitting: Family Medicine

## 2020-08-01 VITALS — BP 172/87 | Temp 98.5°F | Ht 69.0 in | Wt 226.2 lb

## 2020-08-01 DIAGNOSIS — J449 Chronic obstructive pulmonary disease, unspecified: Secondary | ICD-10-CM | POA: Diagnosis not present

## 2020-08-01 DIAGNOSIS — I2584 Coronary atherosclerosis due to calcified coronary lesion: Secondary | ICD-10-CM | POA: Diagnosis not present

## 2020-08-01 DIAGNOSIS — E039 Hypothyroidism, unspecified: Secondary | ICD-10-CM | POA: Diagnosis not present

## 2020-08-01 DIAGNOSIS — I2510 Atherosclerotic heart disease of native coronary artery without angina pectoris: Secondary | ICD-10-CM | POA: Diagnosis not present

## 2020-08-01 DIAGNOSIS — E782 Mixed hyperlipidemia: Secondary | ICD-10-CM

## 2020-08-01 DIAGNOSIS — R7309 Other abnormal glucose: Secondary | ICD-10-CM | POA: Diagnosis not present

## 2020-08-01 DIAGNOSIS — I10 Essential (primary) hypertension: Secondary | ICD-10-CM

## 2020-08-01 MED ORDER — NIFEDIPINE ER 90 MG PO TB24: 90.0000 mg | ORAL_TABLET | Freq: Every day | ORAL | 1 refills | Status: DC

## 2020-08-01 NOTE — Progress Notes (Signed)
Subjective:  Patient ID: Frank Rosales, male    DOB: June 18, 1955  Age: 65 y.o. MRN: [id number]  CC: Medical Management of Chronic Issues   HPI Frank Rosales presents for  follow-up on  thyroid. The patient has a history of hypothyroidism for many years. It has been stable recently. Pt. denies any change in  voice, loss of hair, heat or cold intolerance. Energy level has been adequate to good. Patient denies constipation and diarrhea. No myxedema. Medication is as noted below. Verified that pt is taking it daily on an empty stomach. Well tolerated.  presents for  follow-up of hypertension. Patient has no history of headache chest pain or shortness of breath or recent cough. Patient also denies symptoms of TIA such as focal numbness or weakness. Patient denies side effects from medication. States taking it regularly. Weight going up. Drinking 6 beers a day. Natural lite. Bored due to inactivity. COPD prevents a lot of activities. Gets dyspneic in the shower, but he can walk on level ground. Has to stop and rest frequently. Sees Dr. Melvyn Novas of pulmonology.   Depression screen [facility] 2/9 08/01/2020 02/02/2020 07/21/2019  Decreased Interest 0 0 0  Down, Depressed, Hopeless 0 0 0  PHQ - 2 Score 0 0 0  Altered sleeping - - -  Tired, decreased energy - - -  Change in appetite - - -  Feeling bad or failure about yourself  - - -  Trouble concentrating - - -  Moving slowly or fidgety/restless - - -  Suicidal thoughts - - -  PHQ-9 Score - - -  Some recent data might be hidden    History Frank Rosales has a past medical history of Anxiety, Arthritis, Asthma, Atypical nevus (03/27/2011), Atypical nevus (01/20/2007), Colon polyps, COPD (chronic obstructive pulmonary disease) ([place]), High blood pressure, High cholesterol, Hilar adenopathy, Hypothyroidism, Multiple rib fractures (08/2016), Nodular basal cell carcinoma (BCC) (02/18/2020), SCCA (squamous cell carcinoma) of skin (02/18/2020), SCCA (squamous cell  carcinoma) of skin (02/18/2020), SCCA (squamous cell carcinoma) of skin (02/18/2020), Squamous cell carcinoma in situ (SCCIS) (01/09/2016), and Superficial nodular basal cell carcinoma (BCC) (01/09/2016).   He has a past surgical history that includes Knee arthroscopy (Right, 05/14/2010); Wisdom tooth extraction; epidural injections; Fixation kyphoplasty lumbar spine; Back surgery; Video assisted thoracoscopy (Left, 08/22/2016); Pleural effusion drainage (Left, 08/22/2016); and Rib plating (Left, 08/22/2016).   His family history includes Diabetes in his mother; Emphysema in his father; Heart disease in his brother; Heart failure in his mother; Lung cancer in his father.He reports that he quit smoking about 2 years ago. His smoking use included cigarettes. He has a 67.50 pack-year smoking history. He has never used smokeless tobacco. He reports current alcohol use of about 42.0 standard drinks of alcohol per week. He reports that he does not use drugs.    ROS Review of Systems  Constitutional: Positive for activity change. Negative for fever.  Respiratory: Positive for shortness of breath.   Cardiovascular: Negative for chest pain.  Musculoskeletal: Positive for back pain. Negative for arthralgias.  Skin: Negative for rash.    Objective:  BP (!) 172/87   Temp 98.5 F (36.9 C)   Ht _0  (1.753 m)   Wt 226 lb 3.2 oz (102.6 kg)   BMI 33.40 kg/m   BP Readings from Last 3 Encounters:  08/01/20 (!) 172/87  03/03/20 (!) 142/90  02/02/20 133/89    Wt Readings from Last 3 Encounters:  08/01/20 226 lb 3.2 oz (102.6 kg)  03/03/20 219 lb (99.3 kg)  02/02/20 217 lb 6.4 oz (98.6 kg)     Physical Exam Vitals reviewed.  Constitutional:      Appearance: He is well-developed.  HENT:     Head: Normocephalic and atraumatic.     Right Ear: Tympanic membrane and external ear normal. No decreased hearing noted.     Left Ear: Tympanic membrane and external ear normal. No decreased hearing noted.      Mouth/Throat:     Pharynx: No oropharyngeal exudate or posterior oropharyngeal erythema.  Eyes:     Pupils: Pupils are equal, round, and reactive to light.  Cardiovascular:     Rate and Rhythm: Normal rate and regular rhythm.     Heart sounds: No murmur heard.   Pulmonary:     Effort: No respiratory distress.     Breath sounds: Normal breath sounds.  Abdominal:     General: Bowel sounds are normal.     Palpations: Abdomen is soft. There is no mass.     Tenderness: There is no abdominal tenderness.  Musculoskeletal:        General: Normal range of motion.     Cervical back: Normal range of motion and neck supple.       Assessment & Plan:   Frank Rosales was seen today for medical management of chronic issues.  Diagnoses and all orders for this visit:  Hypothyroidism, unspecified type -     CBC with Differential/Platelet -     CMP14+EGFR -     TSH + free T4  Essential hypertension -     NIFEdipine (ADALAT CC) 90 MG 24 hr tablet; Take 1 tablet (90 mg total) by mouth daily. -     CBC with Differential/Platelet -     CMP14+EGFR  COPD GOLD III  -     CBC with Differential/Platelet -     CMP14+EGFR  Coronary atherosclerosis due to calcified coronary lesion -     CBC with Differential/Platelet -     CMP14+EGFR  Mixed hyperlipidemia -     CMP14+EGFR -     Lipid panel    Patient was advised to taper his beer down to 2 a day maximum.  Since he has been using 6 a day on average long-term, I recommended that he decrease by 1 pill/day/week until he was at a maximum of 2 daily.  Additionally he needs to lose weight and the extra calories in the beer could be eliminated and assist him in the weight loss.  He needs to be exercising.  He is limited by his COPD, however he needs to maximize what he can do and restrict calories that he consumes daily as well.   I have changed Frank Rosales's NIFEdipine. I am also having him maintain his aspirin, cholecalciferol, vitamin J-33,  folic acid, acetaminophen, albuterol, triamcinolone, Famotidine (PEPCID PO), vitamin C, Breztri Aerosphere, albuterol, Symbicort, atorvastatin, levothyroxine, olmesartan, and Dupixent.  Allergies as of 08/01/2020      Reactions   No Known Allergies       Medication List       Accurate as of August 01, 2020 11:42 AM. If you have any questions, ask your nurse or doctor.        acetaminophen 325 MG tablet Commonly known as: TYLENOL Take 2 tablets (650 mg total) by mouth every 6 (six) hours as needed.   albuterol (2.5 MG/3ML) 0.083% nebulizer solution Commonly known as: PROVENTIL Take 3 mLs (2.5 mg total) by nebulization every  6 (six) hours as needed for wheezing or shortness of breath.   albuterol 108 (90 Base) MCG/ACT inhaler Commonly known as: ProAir HFA Inhale 2 puffs into the lungs every 6 (six) hours.   aspirin 81 MG tablet Take 81 mg by mouth daily.   atorvastatin 40 MG tablet Commonly known as: LIPITOR TAKE 1 TABLET BY MOUTH  DAILY FOR CHOLESTEROL   Breztri Aerosphere 160-9-4.8 MCG/ACT Aero Generic drug: Budeson-Glycopyrrol-Formoterol Inhale 2 puffs into the lungs 2 (two) times daily. What changed: when to take this   cholecalciferol 1000 units tablet Commonly known as: VITAMIN D Take 1,000 Units by mouth daily.   Dupixent 300 MG/2ML prefilled syringe Generic drug: dupilumab Inject 300 mg into the skin every 14 (fourteen) days. Starting at day 15 for maintenance.   folic acid 1 MG tablet Commonly known as: FOLVITE Take 1 tablet (1 mg total) by mouth daily.   levothyroxine 50 MCG tablet Commonly known as: SYNTHROID 1 TABLET EVERY MORNING BEFORE BREAKFAST-WAIT 1 HR AFTER TAKING BEFORE EATING OR DRINKING   NIFEdipine 90 MG 24 hr tablet Commonly known as: ADALAT CC Take 1 tablet (90 mg total) by mouth daily. What changed:   medication strength  how much to take Changed by: Claretta Fraise, MD   olmesartan 40 MG tablet Commonly known as: BENICAR Take 1  tablet (40 mg total) by mouth daily. for blood pressure   PEPCID PO Take 1 tablet by mouth daily.   Symbicort 160-4.5 MCG/ACT inhaler Generic drug: budesonide-formoterol Inhale 2 puffs into the lungs at bedtime.   triamcinolone 0.1 % Commonly known as: KENALOG   vitamin B-12 250 MCG tablet Commonly known as: CYANOCOBALAMIN Take 250 mcg by mouth daily.   vitamin C 100 MG tablet Take 100 mg by mouth daily.      Today I asked him to increase his dose of nifedipine extended release to 90 mg a day.  That prescription was sent to his local pharmacy.  Since his blood pressure was so high he was asked to follow-up in 1 month  Follow-up: Return in about 1 month (around 09/01/2020).  Claretta Fraise, M.D.

## 2020-08-02 LAB — LIPID PANEL
Chol/HDL Ratio: 2.7 ratio (ref 0.0–5.0)
Cholesterol, Total: 140 mg/dL (ref 100–199)
HDL: 52 mg/dL (ref >39)
LDL Chol Calc (NIH): 65 mg/dL (ref 0–99)
Triglycerides: 128 mg/dL (ref 0–149)
VLDL Cholesterol Cal: 23 mg/dL (ref 5–40)

## 2020-08-02 LAB — CBC WITH DIFFERENTIAL/PLATELET
Basophils Absolute: 0.1 10*3/uL (ref 0.0–0.2)
Basos: 1 %
EOS (ABSOLUTE): 0.1 10*3/uL (ref 0.0–0.4)
Eos: 1 %
Hematocrit: 48.3 % (ref 37.5–51.0)
Hemoglobin: 16.5 g/dL (ref 13.0–17.7)
Immature Grans (Abs): 0 10*3/uL (ref 0.0–0.1)
Immature Granulocytes: 0 %
Lymphocytes Absolute: 1.4 10*3/uL (ref 0.7–3.1)
Lymphs: 12 %
MCH: 34.7 pg — ABNORMAL HIGH (ref 26.6–33.0)
MCHC: 34.2 g/dL (ref 31.5–35.7)
MCV: 102 fL — ABNORMAL HIGH (ref 79–97)
Monocytes Absolute: 0.9 10*3/uL (ref 0.1–0.9)
Monocytes: 7 %
Neutrophils Absolute: 9.4 10*3/uL — ABNORMAL HIGH (ref 1.4–7.0)
Neutrophils: 79 %
Platelets: 258 10*3/uL (ref 150–450)
RBC: 4.76 x10E6/uL (ref 4.14–5.80)
RDW: 12 % (ref 11.6–15.4)
WBC: 11.9 10*3/uL — ABNORMAL HIGH (ref 3.4–10.8)

## 2020-08-02 LAB — CMP14+EGFR
ALT: 48 IU/L — ABNORMAL HIGH (ref 0–44)
AST: 47 IU/L — ABNORMAL HIGH (ref 0–40)
Albumin/Globulin Ratio: 1.5 (ref 1.2–2.2)
Albumin: 4.5 g/dL (ref 3.8–4.8)
Alkaline Phosphatase: 114 IU/L (ref 44–121)
BUN/Creatinine Ratio: 10 (ref 10–24)
BUN: 9 mg/dL (ref 8–27)
Bilirubin Total: 0.8 mg/dL (ref 0.0–1.2)
CO2: 21 mmol/L (ref 20–29)
Calcium: 9.5 mg/dL (ref 8.6–10.2)
Chloride: 96 mmol/L (ref 96–106)
Creatinine, Ser: 0.93 mg/dL (ref 0.76–1.27)
Globulin, Total: 3.1 g/dL (ref 1.5–4.5)
Glucose: 226 mg/dL — ABNORMAL HIGH (ref 65–99)
Potassium: 4.8 mmol/L (ref 3.5–5.2)
Sodium: 135 mmol/L (ref 134–144)
Total Protein: 7.6 g/dL (ref 6.0–8.5)
eGFR: 93 mL/min/{1.73_m2} (ref >59)

## 2020-08-02 LAB — TSH+FREE T4
Free T4: 1.4 ng/dL (ref 0.82–1.77)
TSH: 3.2 u[IU]/mL (ref 0.450–4.500)

## 2020-08-03 LAB — HGB A1C W/O EAG: Hgb A1c MFr Bld: 7.9 % — ABNORMAL HIGH (ref 4.8–5.6)

## 2020-08-03 LAB — SPECIMEN STATUS REPORT

## 2020-08-05 ENCOUNTER — Telehealth: Payer: Self-pay

## 2020-08-05 NOTE — Telephone Encounter (Signed)
Please review and advise.

## 2020-08-11 ENCOUNTER — Other Ambulatory Visit: Payer: Self-pay

## 2020-08-11 ENCOUNTER — Encounter: Payer: Self-pay | Admitting: Family Medicine

## 2020-08-11 ENCOUNTER — Ambulatory Visit (INDEPENDENT_AMBULATORY_CARE_PROVIDER_SITE_OTHER): Payer: Medicare Other | Admitting: Family Medicine

## 2020-08-11 VITALS — Temp 99.0°F | Ht 69.0 in | Wt 225.8 lb

## 2020-08-11 DIAGNOSIS — G4733 Obstructive sleep apnea (adult) (pediatric): Secondary | ICD-10-CM

## 2020-08-11 DIAGNOSIS — E119 Type 2 diabetes mellitus without complications: Secondary | ICD-10-CM | POA: Diagnosis not present

## 2020-08-11 DIAGNOSIS — J449 Chronic obstructive pulmonary disease, unspecified: Secondary | ICD-10-CM | POA: Diagnosis not present

## 2020-08-11 DIAGNOSIS — E039 Hypothyroidism, unspecified: Secondary | ICD-10-CM | POA: Diagnosis not present

## 2020-08-11 MED ORDER — METFORMIN HCL ER 500 MG PO TB24: 500.0000 mg | ORAL_TABLET | Freq: Every day | ORAL | 2 refills | Status: DC

## 2020-08-11 MED ORDER — BLOOD GLUCOSE MONITOR KIT: PACK | 11 refills | Status: DC

## 2020-08-11 NOTE — Patient Instructions (Signed)
Diabetes Mellitus and Exercise Exercising regularly is important for overall health, especially for people who have diabetes mellitus. Exercising is not only about losing weight. It has many other health benefits, such as increasing muscle strength and bone density and reducing body fat and stress. This leads to improved fitness, flexibility, and endurance, all of which result in better overall health. What are the benefits of exercise if I have diabetes? Exercise has many benefits for people with diabetes. They include:  Helping to lower and control blood sugar (glucose).  Helping the body to respond better to the hormone insulin by improving insulin sensitivity.  Reducing how much insulin the body needs.  Lowering the risk for heart disease by: ? Lowering "bad" cholesterol and triglyceride levels. ? Increasing "good" cholesterol levels. ? Lowering blood pressure. ? Lowering blood glucose levels. What is my activity plan? Your health care provider or certified diabetes educator can help you make a plan for the type and frequency of exercise that works for you. This is called your activity plan. Be sure to:  Get at least 150 minutes of medium-intensity or high-intensity exercise each week. Exercises may include brisk walking, biking, or water aerobics.  Do stretching and strengthening exercises, such as yoga or weight lifting, at least 2 times a week.  Spread out your activity over at least 3 days of the week.  Get some form of physical activity each day. ? Do not go more than 2 days in a row without some kind of physical activity. ? Avoid being inactive for more than 90 minutes at a time. Take frequent breaks to walk or stretch.  Choose exercises or activities that you enjoy. Set realistic goals.  Start slowly and gradually increase your exercise intensity over time.   How do I manage my diabetes during exercise? Monitor your blood glucose  Check your blood glucose before and  after exercising. If your blood glucose is: ? 240 mg/dL (13.3 mmol/L) or higher before you exercise, check your urine for ketones. These are chemicals created by the liver. If you have ketones in your urine, do not exercise until your blood glucose returns to normal. ? 100 mg/dL (5.6 mmol/L) or lower, eat a snack containing 15-20 grams of carbohydrate. Check your blood glucose 15 minutes after the snack to make sure that your glucose level is above 100 mg/dL (5.6 mmol/L) before you start your exercise.  Know the symptoms of low blood glucose (hypoglycemia) and how to treat it. Your risk for hypoglycemia increases during and after exercise. Follow these tips and your health care provider's instructions  Keep a carbohydrate snack that is fast-acting for use before, during, and after exercise to help prevent or treat hypoglycemia.  Avoid injecting insulin into areas of the body that are going to be exercised. For example, avoid injecting insulin into: ? Your arms, when you are about to play tennis. ? Your legs, when you are about to go jogging.  Keep records of your exercise habits. Doing this can help you and your health care provider adjust your diabetes management plan as needed. Write down: ? Food that you eat before and after you exercise. ? Blood glucose levels before and after you exercise. ? The type and amount of exercise you have done.  Work with your health care provider when you start a new exercise or activity. He or she may need to: ? Make sure that the activity is safe for you. ? Adjust your insulin, other medicines, and food that   you eat.  Drink plenty of water while you exercise. This prevents loss of water (dehydration) and problems caused by a lot of heat in the body (heat stroke).   Where to find more information  American Diabetes Association: www.diabetes.org Summary  Exercising regularly is important for overall health, especially for people who have diabetes  mellitus.  Exercising has many health benefits. It increases muscle strength and bone density and reduces body fat and stress. It also lowers and controls blood glucose.  Your health care provider or certified diabetes educator can help you make an activity plan for the type and frequency of exercise that works for you.  Work with your health care provider to make sure any new activity is safe for you. Also work with your health care provider to adjust your insulin, other medicines, and the food you eat. This information is not intended to replace advice given to you by your health care provider. Make sure you discuss any questions you have with your health care provider. Document Revised: 01/26/2019 Document Reviewed: 01/26/2019 Elsevier Patient Education  2021 [place]. https://www.diabeteseducator.org/docs/default-source/living-with-diabetes/conquering-the-grocery-store-v1.pdf?sfvrsn=4">  Carbohydrate Counting for Diabetes Mellitus, Adult Carbohydrate counting is a method of keeping track of how many carbohydrates you eat. Eating carbohydrates naturally increases the amount of sugar (glucose) in the blood. Counting how many carbohydrates you eat improves your blood glucose control, which helps you manage your diabetes. It is important to know how many carbohydrates you can safely have in each meal. This is different for every person. A dietitian can help you make a meal plan and calculate how many carbohydrates you should have at each meal and snack. What foods contain carbohydrates? Carbohydrates are found in the following foods:  Grains, such as breads and cereals.  Dried beans and soy products.  Starchy vegetables, such as potatoes, peas, and corn.  Fruit and fruit juices.  Milk and yogurt.  Sweets and snack foods, such as cake, cookies, candy, chips, and soft drinks.   How do I count carbohydrates in foods? There are two ways to count carbohydrates in food. You can read food  labels or learn standard serving sizes of foods. You can use either of the methods or a combination of both. Using the Nutrition Facts label The Nutrition Facts list is included on the labels of almost all packaged foods and beverages in the [place] It includes:  The serving size.  Information about nutrients in each serving, including the grams (g) of carbohydrate per serving. To use the Nutrition Facts:  Decide how many servings you will have.  Multiply the number of servings by the number of carbohydrates per serving.  The resulting number is the total amount of carbohydrates that you will be having. Learning the standard serving sizes of foods When you eat carbohydrate foods that are not packaged or do not include Nutrition Facts on the label, you need to measure the servings in order to count the amount of carbohydrates.  Measure the foods that you will eat with a food scale or measuring cup, if needed.  Decide how many standard-size servings you will eat.  Multiply the number of servings by 15. For foods that contain carbohydrates, one serving equals 15 g of carbohydrates. ? For example, if you eat 2 cups or 10 oz (300 g) of strawberries, you will have eaten 2 servings and 30 g of carbohydrates (2 servings x 15 g = 30 g).  For foods that have more than one food mixed, such as soups and  casseroles, you must count the carbohydrates in each food that is included. The following list contains standard serving sizes of common carbohydrate-rich foods. Each of these servings has about 15 g of carbohydrates:  1 slice of bread.  1 six-inch (15 cm) tortilla.  ? cup or 2 oz (53 g) cooked rice or pasta.   cup or 3 oz (85 g) cooked or canned, drained and rinsed beans or lentils.   cup or 3 oz (85 g) starchy vegetable, such as peas, corn, or squash.   cup or 4 oz (120 g) hot cereal.   cup or 3 oz (85 g) boiled or mashed potatoes, or  or 3 oz (85 g) of a large baked potato.   cup or  4 fl oz (118 mL) fruit juice.  1 cup or 8 fl oz (237 mL) milk.  1 small or 4 oz (106 g) apple.   or 2 oz (63 g) of a medium banana.  1 cup or 5 oz (150 g) strawberries.  3 cups or 1 oz (24 g) popped popcorn. What is an example of carbohydrate counting? To calculate the number of carbohydrates in this sample meal, follow the steps shown below. Sample meal  3 oz (85 g) chicken breast.  ? cup or 4 oz (106 g) brown rice.   cup or 3 oz (85 g) corn.  1 cup or 8 fl oz (237 mL) milk.  1 cup or 5 oz (150 g) strawberries with sugar-free whipped topping. Carbohydrate calculation 1. Identify the foods that contain carbohydrates: ? Rice. ? Corn. ? Milk. ? Strawberries. 2. Calculate how many servings you have of each food: ? 2 servings rice. ? 1 serving corn. ? 1 serving milk. ? 1 serving strawberries. 3. Multiply each number of servings by 15 g: ? 2 servings rice x 15 g = 30 g. ? 1 serving corn x 15 g = 15 g. ? 1 serving milk x 15 g = 15 g. ? 1 serving strawberries x 15 g = 15 g. 4. Add together all of the amounts to find the total grams of carbohydrates eaten: ? 30 g + 15 g + 15 g + 15 g = 75 g of carbohydrates total. What are tips for following this plan? Shopping  Develop a meal plan and then make a shopping list.  Buy fresh and frozen vegetables, fresh and frozen fruit, dairy, eggs, beans, lentils, and whole grains.  Look at food labels. Choose foods that have more fiber and less sugar.  Avoid processed foods and foods with added sugars. Meal planning  Aim to have the same amount of carbohydrates at each meal and for each snack time.  Plan to have regular, balanced meals and snacks. Where to find more information  American Diabetes Association: www.diabetes.org  Centers for Disease Control and Prevention: http://www.wolf.info/ Summary  Carbohydrate counting is a method of keeping track of how many carbohydrates you eat.  Eating carbohydrates naturally increases the  amount of sugar (glucose) in the blood.  Counting how many carbohydrates you eat improves your blood glucose control, which helps you manage your diabetes.  A dietitian can help you make a meal plan and calculate how many carbohydrates you should have at each meal and snack. This information is not intended to replace advice given to you by your health care provider. Make sure you discuss any questions you have with your health care provider. Document Revised: 04/30/2019 Document Reviewed: 05/01/2019 Elsevier Patient Education  2021 [employer].

## 2020-08-14 ENCOUNTER — Encounter: Payer: Self-pay | Admitting: Family Medicine

## 2020-08-14 NOTE — Progress Notes (Signed)
Chief Complaint  Patient presents with  . Diabetes    HPI  Patient presents today for counseling session regarding his new diabetes diagnosis.  Mr. Bufford has been a COPD patient for several years.  He also experiences hypothyroidism.  At his most recent evaluation his fingerstick blood sugar was found to be 200+.  A hemoglobin A1c was drawn for verification and came back 7.9.  He is overweight and has used steroids for COPD exacerbations in the past as well.  Today we discussed pathophysiology of diabetes primarily insulin resistance.  Effects on end organs were reviewed as well.  Risks to health based on this was explained.  Detailed instruction for diabetic, carb controlled diet was reviewed.  He was advised to begin an exercise program.  We discussed the need for cardio and resistance exercising.  Specifically with regard to resistance training and muscle tone and its impact on insulin resistance.  He was briefed on the need to check his blood sugars both fasting and 2 hours postprandial.  Log sheet was given.  Prescription for glucose monitor and supplies were given as well.  He was offered instruction on use of these.  PMH: Smoking status noted ROS: Per HPI  Objective: Temp 99 F (37.2 C)   Ht 5' 9"  (1.753 m)   Wt 225 lb 12.8 oz (102.4 kg)   BMI 33.34 kg/m  Gen: NAD, alert, cooperative with exam HEENT: NCAT, EOMI, Ext: No edema, warm Neuro: Alert and oriented, No gross deficits  Assessment and plan:  1. Diabetes mellitus, new onset ([place])   2. COPD GOLD III    3. OSA (obstructive sleep apnea)   4. Hypothyroidism, unspecified type     Meds ordered this encounter  Medications  . metFORMIN (GLUCOPHAGE-XR) 500 MG 24 hr tablet    Sig: Take 1 tablet (500 mg total) by mouth daily with breakfast.    Dispense:  30 tablet    Refill:  2  . blood glucose meter kit and supplies KIT    Sig: Dispense based on patient and insurance preference. Use up to four times daily as directed.  (FOR ICD-10 : E11.9    Dispense:  1 each    Refill:  11    Order Specific Question:   Number of strips    Answer:   100    Order Specific Question:   Number of lancets    Answer:   100    40 minutes was spent with the patient.  More than half was spent in counseling regarding his new diagnoses, pathophysiology in layman's terms, treatment options, nonpharmacologic needs including exercise weight loss and diet.  Handouts on diet and exercise related to diabetes were given.   Follow up 2  weeks Claretta Fraise, MD

## 2020-09-01 ENCOUNTER — Other Ambulatory Visit: Payer: Self-pay

## 2020-09-01 ENCOUNTER — Encounter: Payer: Self-pay | Admitting: Family Medicine

## 2020-09-01 ENCOUNTER — Ambulatory Visit (INDEPENDENT_AMBULATORY_CARE_PROVIDER_SITE_OTHER): Payer: Medicare Other | Admitting: Family Medicine

## 2020-09-01 VITALS — BP 148/89 | HR 100 | Temp 98.7°F | Ht 69.0 in | Wt 224.6 lb

## 2020-09-01 DIAGNOSIS — E119 Type 2 diabetes mellitus without complications: Secondary | ICD-10-CM

## 2020-09-01 MED ORDER — SPIRIVA RESPIMAT 2.5 MCG/ACT IN AERS: 1.0000 | INHALATION_SPRAY | Freq: Two times a day (BID) | RESPIRATORY_TRACT | 11 refills | Status: DC

## 2020-09-01 MED ORDER — METFORMIN HCL ER 500 MG PO TB24: 1000.0000 mg | ORAL_TABLET | Freq: Every day | ORAL | 2 refills | Status: DC

## 2020-09-01 MED ORDER — SYMBICORT 160-4.5 MCG/ACT IN AERO: 2.0000 | INHALATION_SPRAY | Freq: Every day | RESPIRATORY_TRACT | 3 refills | Status: DC

## 2020-09-01 MED ORDER — BREZTRI AEROSPHERE 160-9-4.8 MCG/ACT IN AERO: 2.0000 | INHALATION_SPRAY | Freq: Two times a day (BID) | RESPIRATORY_TRACT | 3 refills | Status: DC

## 2020-09-01 NOTE — Progress Notes (Signed)
Subjective:  Patient ID: Frank Rosales, male    DOB: 20-Sep-1955  Age: 65 y.o. MRN: [id number]  CC: Medical Management of Chronic Issues   HPI [name] presents forFollow-up of diabetes. Patient checks blood sugar at home.  Log returned.  He is checking sugar fasting and the numbers are running 1 16-1 90.  The postprandial sugars are running from 1 10-1 60 most of them are down in the 1 20-1 40 range. Patient denies symptoms such as polyuria, polydipsia, excessive hunger, nausea No significant hypoglycemic spells noted. Medications reviewed. Pt reports taking them regularly without complication/adverse reaction being reported today.  Checking feet daily.  COPD - walks 150-300 feet. Has to rest for a few minutes =mMRC level 3 Exacerbations in 12 mos : 0  History Dylan has a past medical history of Anxiety, Arthritis, Asthma, Atypical nevus (03/27/2011), Atypical nevus (01/20/2007), Colon polyps, COPD (chronic obstructive pulmonary disease) ([place]), Diabetes mellitus without complication ([place]), High blood pressure, High cholesterol, Hilar adenopathy, Hypothyroidism, Multiple rib fractures (08/2016), Nodular basal cell carcinoma (BCC) (02/18/2020), SCCA (squamous cell carcinoma) of skin (02/18/2020), SCCA (squamous cell carcinoma) of skin (02/18/2020), SCCA (squamous cell carcinoma) of skin (02/18/2020), Squamous cell carcinoma in situ (SCCIS) (01/09/2016), and Superficial nodular basal cell carcinoma (BCC) (01/09/2016).   He has a past surgical history that includes Knee arthroscopy (Right, 05/14/2010); Wisdom tooth extraction; epidural injections; Fixation kyphoplasty lumbar spine; Back surgery; Video assisted thoracoscopy (Left, 08/22/2016); Pleural effusion drainage (Left, 08/22/2016); and Rib plating (Left, 08/22/2016).   His family history includes Diabetes in his mother; Emphysema in his father; Heart disease in his brother; Heart failure in his mother; Lung cancer in his father.He  reports that he quit smoking about 2 years ago. His smoking use included cigarettes. He has a 67.50 pack-year smoking history. He has never used smokeless tobacco. He reports current alcohol use of about 42.0 standard drinks of alcohol per week. He reports that he does not use drugs.  Current Outpatient Medications on File Prior to Visit  Medication Sig Dispense Refill  . acetaminophen (TYLENOL) 325 MG tablet Take 2 tablets (650 mg total) by mouth every 6 (six) hours as needed.    Marland Kitchen albuterol (PROAIR HFA) 108 (90 Base) MCG/ACT inhaler Inhale 2 puffs into the lungs every 6 (six) hours. 54 g 1  . albuterol (PROVENTIL) (2.5 MG/3ML) 0.083% nebulizer solution Take 3 mLs (2.5 mg total) by nebulization every 6 (six) hours as needed for wheezing or shortness of breath. 240 mL 5  . Ascorbic Acid (VITAMIN C) 100 MG tablet Take 100 mg by mouth daily.    Marland Kitchen aspirin 81 MG tablet Take 81 mg by mouth daily.     Marland Kitchen atorvastatin (LIPITOR) 40 MG tablet TAKE 1 TABLET BY MOUTH  DAILY FOR CHOLESTEROL 90 tablet 1  . blood glucose meter kit and supplies KIT Dispense based on patient and insurance preference. Use up to four times daily as directed. (FOR ICD-10 : E11.9 1 each 11  . cholecalciferol (VITAMIN D) 1000 UNITS tablet Take 1,000 Units by mouth daily.     . dupilumab (DUPIXENT) 300 MG/2ML prefilled syringe Inject 300 mg into the skin every 14 (fourteen) days. Starting at day 15 for maintenance. 4 mL 3  . Famotidine (PEPCID PO) Take 1 tablet by mouth daily.    . folic acid (FOLVITE) 1 MG tablet Take 1 tablet (1 mg total) by mouth daily. 90 tablet 3  . levothyroxine (SYNTHROID) 50 MCG tablet 1 TABLET EVERY  MORNING BEFORE BREAKFAST-WAIT 1 HR AFTER TAKING BEFORE EATING OR DRINKING 90 tablet 2  . NIFEdipine (ADALAT CC) 90 MG 24 hr tablet Take 1 tablet (90 mg total) by mouth daily. 90 tablet 1  . olmesartan (BENICAR) 40 MG tablet Take 1 tablet (40 mg total) by mouth daily. for blood pressure 90 tablet 1  . triamcinolone  cream (KENALOG) 0.1 %     . vitamin B-12 (CYANOCOBALAMIN) 250 MCG tablet Take 250 mcg by mouth daily.     No current facility-administered medications on file prior to visit.    ROS Review of Systems  Constitutional: Negative for fever.  Respiratory: Positive for shortness of breath.   Cardiovascular: Negative for chest pain.  Musculoskeletal: Negative for arthralgias.  Skin: Negative for rash.    Objective:  BP (!) 148/89   Pulse 100   Temp 98.7 F (37.1 C) (Temporal)   Ht _0  (1.753 m)   Wt 224 lb 9.6 oz (101.9 kg)   SpO2 93%   BMI 33.17 kg/m   BP Readings from Last 3 Encounters:  09/01/20 (!) 148/89  08/01/20 (!) 172/87  03/03/20 (!) 142/90    Wt Readings from Last 3 Encounters:  09/01/20 224 lb 9.6 oz (101.9 kg)  08/11/20 225 lb 12.8 oz (102.4 kg)  08/01/20 226 lb 3.2 oz (102.6 kg)     Physical Exam Vitals reviewed.  Constitutional:      Appearance: He is well-developed.  HENT:     Head: Normocephalic and atraumatic.     Right Ear: External ear normal.     Left Ear: External ear normal.     Mouth/Throat:     Pharynx: No oropharyngeal exudate or posterior oropharyngeal erythema.  Eyes:     Pupils: Pupils are equal, round, and reactive to light.  Cardiovascular:     Rate and Rhythm: Normal rate and regular rhythm.     Heart sounds: No murmur heard.   Pulmonary:     Effort: No respiratory distress.     Breath sounds: Normal breath sounds.  Musculoskeletal:     Cervical back: Normal range of motion and neck supple.  Neurological:     Mental Status: He is alert and oriented to person, place, and time.       Assessment & Plan:   Aarik was seen today for medical management of chronic issues.  Diagnoses and all orders for this visit:  Diabetes mellitus, new onset ([place])  Other orders -     Discontinue: metFORMIN (GLUCOPHAGE-XR) 500 MG 24 hr tablet; Take 2 tablets (1,000 mg total) by mouth daily with breakfast. -     Tiotropium Bromide  Monohydrate (SPIRIVA RESPIMAT) 2.5 MCG/ACT AERS; Inhale 1 puff into the lungs 2 (two) times daily. -     SYMBICORT 160-4.5 MCG/ACT inhaler; Inhale 2 puffs into the lungs at bedtime. -     Budeson-Glycopyrrol-Formoterol (BREZTRI AEROSPHERE) 160-9-4.8 MCG/ACT AERO; Inhale 2 puffs into the lungs 2 (two) times daily. -     metFORMIN (GLUCOPHAGE-XR) 500 MG 24 hr tablet; Take 2 tablets (1,000 mg total) by mouth daily with breakfast.      I am having Legrand Como A. Inclan start on Spiriva Respimat. I am also having him maintain his aspirin, cholecalciferol, vitamin D-22, folic acid, acetaminophen, albuterol, triamcinolone cream, Famotidine (PEPCID PO), vitamin C, albuterol, atorvastatin, levothyroxine, olmesartan, Dupixent, NIFEdipine, blood glucose meter kit and supplies, Symbicort, [employer], and metFORMIN.  Meds ordered this encounter  Medications  . DISCONTD: metFORMIN (GLUCOPHAGE-XR) 500 MG  24 hr tablet    Sig: Take 2 tablets (1,000 mg total) by mouth daily with breakfast.    Dispense:  60 tablet    Refill:  2  . Tiotropium Bromide Monohydrate (SPIRIVA RESPIMAT) 2.5 MCG/ACT AERS    Sig: Inhale 1 puff into the lungs 2 (two) times daily.    Dispense:  3 each    Refill:  11  . SYMBICORT 160-4.5 MCG/ACT inhaler    Sig: Inhale 2 puffs into the lungs at bedtime.    Dispense:  90 each    Refill:  3  . Budeson-Glycopyrrol-Formoterol (BREZTRI AEROSPHERE) 160-9-4.8 MCG/ACT AERO    Sig: Inhale 2 puffs into the lungs 2 (two) times daily.    Dispense:  32.1 g    Refill:  3  . metFORMIN (GLUCOPHAGE-XR) 500 MG 24 hr tablet    Sig: Take 2 tablets (1,000 mg total) by mouth daily with breakfast.    Dispense:  180 tablet    Refill:  2     Follow-up: Return in about 2 months (around 11/01/2020).  Claretta Fraise, M.D.

## 2020-09-04 ENCOUNTER — Encounter: Payer: Self-pay | Admitting: Family Medicine

## 2020-09-04 DIAGNOSIS — E119 Type 2 diabetes mellitus without complications: Secondary | ICD-10-CM | POA: Insufficient documentation

## 2020-09-16 ENCOUNTER — Ambulatory Visit: Payer: Medicare Other | Admitting: Internal Medicine

## 2020-09-16 ENCOUNTER — Encounter: Payer: Self-pay | Admitting: Internal Medicine

## 2020-09-16 ENCOUNTER — Other Ambulatory Visit: Payer: Self-pay

## 2020-09-16 DIAGNOSIS — J449 Chronic obstructive pulmonary disease, unspecified: Secondary | ICD-10-CM

## 2020-09-16 NOTE — Patient Instructions (Signed)
Plan A = Automatic = Always=    breztri or symbicort 160  Take 2 puffs first thing in am and then another 2 puffs about 12 hours later.    Plan B = Backup (to supplement plan A, not to replace it) Only use your albuterol inhaler as a rescue medication to be used if you can't catch your breath by resting or doing a relaxed purse lip breathing pattern.  - The less you use it, the better it will work when you need it. - Ok to use the inhaler up to 2 puffs  every 4 hours if you must but call for appointment if use goes up over your usual need - Don't leave home without it !!  (think of it like the spare tire for your car)   Plan C = Crisis (instead of Plan B but only if Plan B stops working) - only use your albuterol nebulizer if you first try Plan B and it fails to help > ok to use the nebulizer up to every 4 hours but if start needing it regularly call for immediate appointment   Please schedule a follow up visit in 6 months but call sooner if needed

## 2020-09-16 NOTE — Progress Notes (Signed)
Subjective:   Patient ID: Frank Rosales, male    DOB: 01/09/1956    MRN: [id number]    Brief patient profile:  60  yowm/MM "quit smoking"  11/2017  with h/o exposure to Asbestosis at [place] ? Into the 80s  still able to walk  flat fine but steps x 3 flights sob with GOLD III copd 2013 prev eval by Dr Joya Gaskins  / gets annual f/u in [place]     History of Present Illness  02/16/2016  f/u ov/Frank Rosales re:  GOLD III copd /  symbicort 160 2bid / twice weekly saba  Chief Complaint  Patient presents with  . Follow-up    Pt states had CT Chest done 12/27/15- ordered by worker's comp for eval of previous asbestos exp. He states he had been doing well until July 2017 developed cough and congestion that lasted 2 months, but starting to improve.   sick July - September 2017 rx with abx/ steroids better since early Sept 2017 but CT was done in middle of this illness and was abnormal but was done for purpose of screening for asbestos  rec Plan A = Automatic = symbicort 160 Take 2 puffs first thing in am and then another 2 puffs about 12 hours later.  Work on inhaler technique:   Only use your albuterol(proair)  as a rescue medication  The key is to stop smoking completely before smoking completely stops you!      11/07/2016  f/u ov/Frank Rosales re: acute cough - maintained on symb 160 2bid  Chief Complaint  Patient presents with  . Follow-up    Pt c/o chest and nasal congestion, cough, increased SOB for the past wk. His cough is prod with very thick, clear sputum. He states "feels like I'm drowning"-seen by PCP on 6/22 and given steroid inj and abx with no relief.   acute onset with sore thorat gone w/in first day but then nasal / congestion rx by Dr Quinn Axe on 11/02/16 with pred/levaquin Mucus is now clear  Using saba 2 4 x daily  rec Plan A = Automatic = Symbicort 160  Take 2 puffs first thing in am and then another 2 puffs about 12 hours later.  Plan B = Backup Only use your albuterol as a rescue  medication Prednisone 10 mg take  4 each am x 2 days,   2 each am x 2 days,  1 each am x 2 days and stop  Try prilosec otc 62m  Take 30-60 min before first meal of the day and Pepcid ac (famotidine) 20 mg one @  bedtime until cough is completely gone for at least a week without the need for cough suppression and stop the krill oil for the same reason until no cough at all  For cough /congestion > mucinex dm up to 1200 mg every 12 hours as needed and tramadol 50 mg up to 2 every 4 hours  For nasal congestion;  advil cold and sinus congestion every 4 hours as needed    12/23/2017  f/u ov/Frank Rosales re:   COPD III/ stopped smoking  July 2019  Chief Complaint  Patient presents with  . Follow-up    He c/o non prod cough- esp worse in the am's. Breathing is about the same. He has completely stopped smoking. He is using his rescue inhaler about once per wk.   Dyspnea:  MMRC3 = can't walk 100 yards even at a slow pace at a flat grade s stopping due  to sob   Cough: very harsh only with activity indoor vs outdoor the same/ uses mucinex dm ? Dose   Sleeping: ok p otc sleep aide on 2 pillows SABA use: rare 02: none   rec Stop fish oil when coughing   06/13/2018 acute extended  ov/Frank Rosales re:  Copd III/ still off cigs/ on pred for rash but down to 5 mg daily  Chief Complaint  Patient presents with  . Acute Visit    Pt states getting over bronchitis that he developed about a month. He has cough with minimal clear to somtimes green sputum.  He states he was txed with levaquin and a steroid injection. He rarely uses his rescue inhaler or neb.    Dyspnea:  Back to baseline  > back bothers him more than breathing most of the time  Cough: worse mid morning still green p levaquin Sleeping: on back / 2 pillows  SABA use:  2-3 x per month rec Add on spiriva 2.5 x 2 pff each am  Work on inhaler technique:  Augmentin 875 mg take one pill twice daily  X 10 days  > transiently better    06/27/2018  f/u ov/Frank Rosales re:  copd III/ not convinced better on spiriva vs off  Chief Complaint  Patient presents with  . Follow-up    Pt states he is about the same since last visit. Has complaints of cough with green phlegm and SOB with exertion.  Dyspnea:  MMRC3 = can't walk 100 yards even at a slow pace at a flat grade s stopping due to sob   Cough: worse in am  Assoc with nasal congestion  Sleeping: on back/ 2 pillows  SABA use: rarely  rec Prednisone 10 mg take  4 each am x 2 days,   2 each am x 2 days,  1 each am x 2 days and stop  Augmentin 875 mg take one pill twice daily  X 10 days - take at breakfast and supper with large glass of water.  It would help reduce the usual side effects (diarrhea and yeast infections) if you ate cultured yogurt at lunch.  Mucinex dm up to 1200  every 12 hours as needed  Adding pepcid 20 mg one at bedtime for 2 weeks to see if helps your am cough   Please schedule a follow up office visit in 6 weeks, call sooner if needed with all medications /inhalers/ solutions in hand so we can verify exactly what you are taking. This includes all medications from all doctors and over the counters    televist 11/13/18 no change recs    03/31/2019  f/u ov/Frank Rosales re:  GOLD III  No better on spiriva  Chief Complaint  Patient presents with  . Follow-up    breathing is "not too bad"- using proair and neb both rarely.   Dyspnea:  Only time he leaves the house is to go to doctor/ one flight and stops at top Cough: better, non-productive  Sleeping: on back or side with bed 2 pillows  SABA use: rarely  02: none  rec Plan A = Automatic = Always=    symbicort 160 (or Breztri not both) Take 2 puffs first thing in am and then another 2 puffs about 12 hours later.  Plan B = Backup (to supplement plan A, not to replace it) Only use your albuterol inhaler as a rescue medication Plan C = Crisis (instead of Plan B but only if Plan B stops working) -  only use your albuterol nebulizer if you first try Plan B  and it fails to help     10/05/2019  f/u ov/Frank Rosales re:  GOLD III prefers symbicort /on  dupixent since spring/summer 2020 per [place]  Patient presents with  . Follow-up    Coughing more and feeling more SOB "with the pollen"- clear sputum. He uses his albuterol inhaler about once per wk and never uses neb.   Dyspnea:  Very sedentary, gaining wt  Cough: more congested thicker x 2-3 weeks  Sleeping: on back 2 pillows  SABA use: as above  02: 02  Rec For cough/ congestion :  mucinex or mucinex dm up to 1200 mg every 12 hours  Depomedrol 120 mg IM     02/01/2020  f/u ov/Frank Rosales re: GOLD III copd maint on symbicort/ dupixent per derm Chief Complaint  Patient presents with  . Follow-up    Patient feels like his breathing has got worse since last visit. Struggles to breath with any exertion. Cough in morning with clear sputum.  Dyspnea:  Variable doe/ worse in shower / also limited by back pain which is chronic  Walks to shop x 50 yards slt hill coming back to house  Cough: some in am better p coffee  Sleeping: bed is flat/ two pillows  SABA use: once a week 02: none rec Try albuterol 15 min before an activity that you know would make you short of breath and see if it makes any difference and if makes none then don't take it after activity unless you can't catch your breath.  Plan A = Automatic = Always=    Change to Breztri Take 2 puffs first thing in am and then another 2 puffs about 12 hours later.  Plan B = Backup (to supplement plan A, not to replace it) Only use your albuterol inhaler as a rescue medication Plan C = Crisis (instead of Plan B but only if Plan B stops working) - only use your albuterol nebulizer if you first try Plan B and it fails to help > ok to use the nebulizer up to every 4 hours but if start needing it regularly call for immediate appointment   03/03/2020  f/u ov/Frank Rosales re: copd III/ maint breztri 2 in am / symb  160 2 in pm until uses up symbicort Chief Complaint  Patient presents with  . Follow-up    Breathing is slightly better since the last visit. He has only used his albuterol inhaler prior to activity. He still has prod cough- clear sputum. He states he is using his Breztri 2 puffs am and Symbicort 2 puffs pm.   Dyspnea:  Walks to shop ? Slt easier, limited by back also   Cough:  Min in am  Sleeping: flat bed / 2 pillows  SABA use: none  02: none  rec No change in medications  Please schedule a follow up visit in 6  months but call sooner if needed    09/16/2020  f/u ov/[place] office/Frank Rosales re:  Girtha Rm III / breztri/ dupixent per derm   Chief Complaint  Patient presents with  . Follow-up    Productive cough with a little green phlegm and sometimes clear phlegm  Dyspnea:  Walks to shop x 50 yards s stopping  - not real change symb vs breztri per pt  Cough:slt green just in am chronically/ no recent change  Sleeping: flat bed/ 2 pillows  SABA use: minimal 02: none  Covid status: vax x 3  Lung cancer screening :  Feb 2022  [name] neg per pt (check asbestos "mild"      No obvious day to day or daytime variability or assoc mucus plugs or hemoptysis or cp or chest tightness, subjective wheeze or overt sinus or hb symptoms.   Sleeping as above  without nocturnal  exacerbation  of respiratory  c/o's or need for noct saba. Also denies any obvious fluctuation of symptoms with weather or environmental changes or other aggravating or alleviating factors except as outlined above   No unusual exposure hx or h/o childhood pna/ asthma or knowledge of premature birth.  Current Allergies, Complete Past Medical History, Past Surgical History, Family History, and Social History were reviewed in [employer] record.  ROS  The following are not active complaints unless bolded Hoarseness, sore throat, dysphagia, dental problems, itching, sneezing,  nasal congestion or  discharge of excess mucus or purulent secretions, ear ache,   fever, chills, sweats, unintended wt loss or wt gain, classically pleuritic or exertional cp,  orthopnea pnd or arm/hand swelling  or leg swelling, presyncope, palpitations, abdominal pain, anorexia, nausea, vomiting, diarrhea  or change in bowel habits or change in bladder habits, change in stools or change in urine, dysuria, hematuria,  rash, arthralgias, visual complaints, headache, numbness, weakness or ataxia or problems with walking or coordination,  change in mood or  memory.        Current Meds  Medication Sig  . acetaminophen (TYLENOL) 325 MG tablet Take 2 tablets (650 mg total) by mouth every 6 (six) hours as needed.  Marland Kitchen albuterol (PROAIR HFA) 108 (90 Base) MCG/ACT inhaler Inhale 2 puffs into the lungs every 6 (six) hours.  Marland Kitchen albuterol (PROVENTIL) (2.5 MG/3ML) 0.083% nebulizer solution Take 3 mLs (2.5 mg total) by nebulization every 6 (six) hours as needed for wheezing or shortness of breath.  . Ascorbic Acid (VITAMIN C) 100 MG tablet Take 100 mg by mouth daily.  Marland Kitchen aspirin 81 MG tablet Take 81 mg by mouth daily.   Marland Kitchen atorvastatin (LIPITOR) 40 MG tablet TAKE 1 TABLET BY MOUTH  DAILY FOR CHOLESTEROL  . blood glucose meter kit and supplies KIT Dispense based on patient and insurance preference. Use up to four times daily as directed. (FOR ICD-10 : E11.9  . Budeson-Glycopyrrol-Formoterol (BREZTRI AEROSPHERE) 160-9-4.8 MCG/ACT AERO Inhale 2 puffs into the lungs 2 (two) times daily.  . cholecalciferol (VITAMIN D) 1000 UNITS tablet Take 1,000 Units by mouth daily.   . dupilumab (DUPIXENT) 300 MG/2ML prefilled syringe Inject 300 mg into the skin every 14 (fourteen) days. Starting at day 15 for maintenance.  . Famotidine (PEPCID PO) Take 1 tablet by mouth daily.  . folic acid (FOLVITE) 1 MG tablet Take 1 tablet (1 mg total) by mouth daily.  Marland Kitchen levothyroxine (SYNTHROID) 50 MCG tablet 1 TABLET EVERY MORNING BEFORE BREAKFAST-WAIT 1 HR AFTER  TAKING BEFORE EATING OR DRINKING  . metFORMIN (GLUCOPHAGE-XR) 500 MG 24 hr tablet Take 2 tablets (1,000 mg total) by mouth daily with breakfast.  . NIFEdipine (ADALAT CC) 90 MG 24 hr tablet Take 1 tablet (90 mg total) by mouth daily.  Marland Kitchen olmesartan (BENICAR) 40 MG tablet Take 1 tablet (40 mg total) by mouth daily. for blood pressure  . triamcinolone cream (KENALOG) 0.1 %   . vitamin B-12 (CYANOCOBALAMIN) 250 MCG tablet Take 250 mcg by mouth daily.  Objective:   Physical Exam     09/16/2020      222 03/03/2020  219  02/01/2020    217 10/05/2019    228 03/31/2019  213  01/24/2015        180 > 02/21/2015  180  > 06/16/2015  186  > 02/16/2016  205 > 09/13/2016   185 >  11/07/2016  187 >  12/21/2016  195 > 03/25/2017   200 > 06/25/2017   198  > 12/23/2017   199  > 06/13/2018  200      11/10/14 174 lb (78.926 kg)  11/08/14 176 lb 6.4 oz (80.015 kg)  10/27/14 175 lb (79.379 kg)   Vital signs reviewed  09/16/2020  - Note at rest 02 sats  94% on RA   General appearance:    amb wm nad    HEENT : pt wearing mask not removed for exam due to covid -19 concerns.    NECK :  without JVD/Nodes/TM/ nl carotid upstrokes bilaterally   LUNGS: no acc muscle use,  Mod barrel  contour chest wall with bilateral  Distant bs s audible wheeze and  without cough on insp or exp maneuvers and mod  Hyperresonant  to  percussion bilaterally     CV:  RRR  no s3 or murmur or increase in P2, and no edema   ABD:  soft and nontender with pos mid insp Hoover's  in the supine position. No bruits or organomegaly appreciated, bowel sounds nl  MS:     ext warm without deformities, calf tenderness, cyanosis or clubbing No obvious joint restrictions   SKIN: warm and dry without lesions    NEURO:  alert, approp, nl sensorium with  no motor or cerebellar deficits apparent.            CXR PA and Lateral:   09/16/2020 :    I personally reviewed images and agree with radiology impression as follows:     No active cardiopulmonary disease.     Assessment & Plan:

## 2020-09-16 NOTE — Assessment & Plan Note (Addendum)
Quit smoking Spirometry 11/2017   03/2011 FeV1 49%    Spirometry  02/28/2012  FEV1 45%   - Last day worked = October 13 2014  - PFT's  01/24/2015  FEV1 1.59  (43 % ) ratio 54   p 22 % improvement from saba with DLCO  88 % corrects to 99 % for alv volume s symbicort  - PFTs  12/27/15 1.30 (35%) ratio 35  In midst of a flare- - PFT's  12/21/2016  FEV1 1.40 (39 % ) ratio 50  p 8 % improvement from saba p symbicort 160 x 2  prior to study with DLCO  71/69 % corrects to 85  % for alv volume - PFTS  05/16/17  FEV1  1.32( 37%)  Ratio 37  And DLCO  52% > corrects to 78%  03/25/2017  try bevespi > preferred symb 160  - 06/13/2018    try adding spiriva 2.5 x 2 each am sample  > no better so did not fill rx  - 03/31/2019   Walked RA x one lap =  approx 250 ft @ mod pace - stopped due to sob with sats of 92% at the end of the study. - 02/01/2020  After extensive coaching inhaler device,  effectiveness =  90%  - alpha one AT def screen  02/02/20   MM  Level 151  -  09/16/2020   Walked RA  approx   300 ft  @  moderate pace  stopped due to  sob  Moderat pace walk total 300 feet feeling short of breath at 200 feet but sats still 98% at end typical of a pink puffer    Group D in terms of symptom/risk and laba/lama/ICS  therefore appropriate rx at this point >>>  breztri plus approp saba   F/u can be q 6 m  Each maintenance medication was reviewed in detail including emphasizing most importantly the difference between maintenance and prns and under what circumstances the prns are to be triggered using an action plan format where appropriate.  Total time for H and P, chart review, counseling, reviewing hfa device(s) , directly observing portions of ambulatory 02 saturation study/ and generating customized AVS unique to this office visit / same day charting  > 11min

## 2020-10-03 ENCOUNTER — Other Ambulatory Visit: Payer: Self-pay

## 2020-10-03 ENCOUNTER — Ambulatory Visit (INDEPENDENT_AMBULATORY_CARE_PROVIDER_SITE_OTHER): Payer: Medicare Other | Admitting: Family Medicine

## 2020-10-03 ENCOUNTER — Encounter: Payer: Self-pay | Admitting: Family Medicine

## 2020-10-03 VITALS — BP 152/83 | HR 107 | Temp 99.8°F | Ht 69.0 in | Wt 222.6 lb

## 2020-10-03 DIAGNOSIS — J449 Chronic obstructive pulmonary disease, unspecified: Secondary | ICD-10-CM

## 2020-10-03 DIAGNOSIS — I10 Essential (primary) hypertension: Secondary | ICD-10-CM | POA: Diagnosis not present

## 2020-10-03 DIAGNOSIS — E119 Type 2 diabetes mellitus without complications: Secondary | ICD-10-CM

## 2020-10-03 MED ORDER — METFORMIN HCL ER 750 MG PO TB24: 1500.0000 mg | ORAL_TABLET | Freq: Every day | ORAL | 3 refills | Status: DC

## 2020-10-03 MED ORDER — NIFEDIPINE ER 60 MG PO TB24: 60.0000 mg | ORAL_TABLET | Freq: Two times a day (BID) | ORAL | 3 refills | Status: DC

## 2020-10-03 NOTE — Progress Notes (Signed)
Subjective:  Patient ID: Frank Rosales, male    DOB: [birth year]  Age: 65 y.o. MRN: [id number]  CC: Medical Management of Chronic Issues   HPI Frank Rosales presents for diabetes care. Easily winded.  As result he is unable to get much exercise.  He has lost a bit of weight.  He is seeing Dr. Work who recently upgraded his inhalers and went over them with him.  Reviewed with him an action plan as well.  Has categorized him as a GOLD III based on FEV1 in the 40s. Fasting 150-170. Prandial 130-150. See attached log.  Denies highs and lows.  Medications agreeing with and okay.  No GI side effects in particular.  Depression screen [facility] 2/9 10/03/2020 09/01/2020 08/11/2020  Decreased Interest 0 0 0  Down, Depressed, Hopeless 0 0 0  PHQ - 2 Score 0 0 0  Altered sleeping - - -  Tired, decreased energy - - -  Change in appetite - - -  Feeling bad or failure about yourself  - - -  Trouble concentrating - - -  Moving slowly or fidgety/restless - - -  Suicidal thoughts - - -  PHQ-9 Score - - -  Some recent data might be hidden    History Frank Rosales has a past medical history of Anxiety, Arthritis, Asthma, Atypical nevus (03/27/2011), Atypical nevus (01/20/2007), Colon polyps, COPD (chronic obstructive pulmonary disease) ([place]), Diabetes mellitus without complication ([place]), High blood pressure, High cholesterol, Hilar adenopathy, Hypothyroidism, Multiple rib fractures (08/2016), Nodular basal cell carcinoma (BCC) (02/18/2020), SCCA (squamous cell carcinoma) of skin (02/18/2020), SCCA (squamous cell carcinoma) of skin (02/18/2020), SCCA (squamous cell carcinoma) of skin (02/18/2020), Squamous cell carcinoma in situ (SCCIS) (01/09/2016), and Superficial nodular basal cell carcinoma (BCC) (01/09/2016).   He has a past surgical history that includes Knee arthroscopy (Right, 05/14/2010); Wisdom tooth extraction; epidural injections; Fixation kyphoplasty lumbar spine; Back surgery; Video assisted thoracoscopy  (Left, 08/22/2016); Pleural effusion drainage (Left, 08/22/2016); and Rib plating (Left, 08/22/2016).   His family history includes Diabetes in his mother; Emphysema in his father; Heart disease in his brother; Heart failure in his mother; Lung cancer in his father.He reports that he quit smoking about 2 years ago. His smoking use included cigarettes. He has a 67.50 pack-year smoking history. He has never used smokeless tobacco. He reports current alcohol use of about 42.0 standard drinks of alcohol per week. He reports that he does not use drugs.    ROS Review of Systems  Objective:  BP (!) 152/83   Pulse (!) 107   Temp 99.8 F (37.7 C)   Ht 5' 9"  (1.753 m)   Wt 222 lb 9.6 oz (101 kg)   SpO2 94%   BMI 32.87 kg/m   BP Readings from Last 3 Encounters:  10/03/20 (!) 152/83  09/16/20 (!) 170/88  09/01/20 (!) 148/89    Wt Readings from Last 3 Encounters:  10/03/20 222 lb 9.6 oz (101 kg)  09/16/20 222 lb 9.6 oz (101 kg)  09/01/20 224 lb 9.6 oz (101.9 kg)     Physical Exam Constitutional:      General: He is not in acute distress.    Appearance: He is well-developed.  HENT:     Head: Normocephalic and atraumatic.     Right Ear: External ear normal.     Left Ear: External ear normal.     Nose: Nose normal.  Eyes:     Conjunctiva/sclera: Conjunctivae normal.     Pupils: Pupils  are equal, round, and reactive to light.  Cardiovascular:     Rate and Rhythm: Normal rate and regular rhythm.     Heart sounds: Normal heart sounds. No murmur heard.   Pulmonary:     Effort: Pulmonary effort is normal. No respiratory distress.     Breath sounds: Wheezing present. No rales.  Abdominal:     Palpations: Abdomen is soft.     Tenderness: There is no abdominal tenderness.  Musculoskeletal:        General: Normal range of motion.     Cervical back: Normal range of motion and neck supple.  Skin:    General: Skin is warm and dry.  Neurological:     Mental Status: He is alert and  oriented to person, place, and time.     Deep Tendon Reflexes: Reflexes are normal and symmetric.  Psychiatric:        Behavior: Behavior normal.        Thought Content: Thought content normal.       Assessment & Plan:   [name] was seen today for medical management of chronic issues.  Diagnoses and all orders for this visit:  Diabetes mellitus, new onset ([place])  Essential hypertension -     NIFEdipine (ADALAT CC) 60 MG 24 hr tablet; Take 1 tablet (60 mg total) by mouth 2 (two) times daily.  COPD GOLD III   Other orders -     metFORMIN (GLUCOPHAGE-XR) 750 MG 24 hr tablet; Take 2 tablets (1,500 mg total) by mouth daily with breakfast.       I have changed [name]'s metFORMIN and NIFEdipine. I am also having him maintain his aspirin, cholecalciferol, vitamin T-73, folic acid, acetaminophen, albuterol, triamcinolone cream, Famotidine (PEPCID PO), vitamin C, albuterol, atorvastatin, levothyroxine, olmesartan, Dupixent, blood glucose meter kit and supplies, and [employer].  Allergies as of 10/03/2020      Reactions   No Known Allergies       Medication List       Accurate as of Oct 03, 2020  9:16 AM. If you have any questions, ask your nurse or doctor.        acetaminophen 325 MG tablet Commonly known as: TYLENOL Take 2 tablets (650 mg total) by mouth every 6 (six) hours as needed.   albuterol (2.5 MG/3ML) 0.083% nebulizer solution Commonly known as: PROVENTIL Take 3 mLs (2.5 mg total) by nebulization every 6 (six) hours as needed for wheezing or shortness of breath.   albuterol 108 (90 Base) MCG/ACT inhaler Commonly known as: ProAir HFA Inhale 2 puffs into the lungs every 6 (six) hours.   aspirin 81 MG tablet Take 81 mg by mouth daily.   atorvastatin 40 MG tablet Commonly known as: LIPITOR TAKE 1 TABLET BY MOUTH  DAILY FOR CHOLESTEROL   blood glucose meter kit and supplies Kit Dispense based on patient and insurance preference. Use up to  four times daily as directed. (FOR ICD-10 : E11.9   Breztri Aerosphere 160-9-4.8 MCG/ACT Aero Generic drug: Budeson-Glycopyrrol-Formoterol Inhale 2 puffs into the lungs 2 (two) times daily.   cholecalciferol 1000 units tablet Commonly known as: VITAMIN D Take 1,000 Units by mouth daily.   Dupixent 300 MG/2ML prefilled syringe Generic drug: dupilumab Inject 300 mg into the skin every 14 (fourteen) days. Starting at day 15 for maintenance.   folic acid 1 MG tablet Commonly known as: FOLVITE Take 1 tablet (1 mg total) by mouth daily.   levothyroxine 50 MCG tablet Commonly known  as: SYNTHROID 1 TABLET EVERY MORNING BEFORE BREAKFAST-WAIT 1 HR AFTER TAKING BEFORE EATING OR DRINKING   metFORMIN 750 MG 24 hr tablet Commonly known as: GLUCOPHAGE-XR Take 2 tablets (1,500 mg total) by mouth daily with breakfast. What changed:   medication strength  how much to take Changed by: Claretta Fraise, MD   NIFEdipine 60 MG 24 hr tablet Commonly known as: ADALAT CC Take 1 tablet (60 mg total) by mouth 2 (two) times daily. What changed:   medication strength  how much to take  when to take this Changed by: Claretta Fraise, MD   olmesartan 40 MG tablet Commonly known as: BENICAR Take 1 tablet (40 mg total) by mouth daily. for blood pressure   PEPCID PO Take 1 tablet by mouth daily.   triamcinolone cream 0.1 % Commonly known as: KENALOG   vitamin B-12 250 MCG tablet Commonly known as: CYANOCOBALAMIN Take 250 mcg by mouth daily.   vitamin C 100 MG tablet Take 100 mg by mouth daily.        Follow-up: Return in about 1 month (around 11/03/2020).  Claretta Fraise, M.D.

## 2020-10-25 ENCOUNTER — Other Ambulatory Visit: Payer: Self-pay

## 2020-10-25 ENCOUNTER — Encounter: Payer: Self-pay | Admitting: Family Medicine

## 2020-10-25 ENCOUNTER — Ambulatory Visit (INDEPENDENT_AMBULATORY_CARE_PROVIDER_SITE_OTHER): Payer: Medicare Other | Admitting: Family Medicine

## 2020-10-25 VITALS — BP 140/75 | HR 107 | Temp 99.3°F | Ht 69.0 in | Wt 221.6 lb

## 2020-10-25 DIAGNOSIS — J449 Chronic obstructive pulmonary disease, unspecified: Secondary | ICD-10-CM | POA: Diagnosis not present

## 2020-10-25 DIAGNOSIS — E119 Type 2 diabetes mellitus without complications: Secondary | ICD-10-CM

## 2020-10-25 MED ORDER — DAPAGLIFLOZIN PROPANEDIOL 10 MG PO TABS: 10.0000 mg | ORAL_TABLET | Freq: Every day | ORAL | 3 refills | Status: DC

## 2020-10-25 NOTE — Progress Notes (Signed)
Subjective:  Patient ID: Frank Rosales, male    DOB: 1955-09-21  Age: 65 y.o. MRN: [id number]  CC: Follow-up   HPI NEWMAN WAREN presents for recheck of diabetes. Still learning, adjusting. Weight down 5 lb in three months. Taking metformin 500 2 AM & one in the pm , forgets the evening dose sometimes. He brings in log that shows fasting 140-160 and post prandial 100-130. No low spells.   LEaving on vacation in two days. [place].  Depression screen [facility] 2/9 10/25/2020 10/03/2020 09/01/2020  Decreased Interest 0 0 0  Down, Depressed, Hopeless 0 0 0  PHQ - 2 Score 0 0 0  Altered sleeping - - -  Tired, decreased energy - - -  Change in appetite - - -  Feeling bad or failure about yourself  - - -  Trouble concentrating - - -  Moving slowly or fidgety/restless - - -  Suicidal thoughts - - -  PHQ-9 Score - - -  Some recent data might be hidden    History Tavyn has a past medical history of Anxiety, Arthritis, Asthma, Atypical nevus (03/27/2011), Atypical nevus (01/20/2007), Colon polyps, COPD (chronic obstructive pulmonary disease) ([place]), Diabetes mellitus without complication ([place]), High blood pressure, High cholesterol, Hilar adenopathy, Hypothyroidism, Multiple rib fractures (08/2016), Nodular basal cell carcinoma (BCC) (02/18/2020), SCCA (squamous cell carcinoma) of skin (02/18/2020), SCCA (squamous cell carcinoma) of skin (02/18/2020), SCCA (squamous cell carcinoma) of skin (02/18/2020), Squamous cell carcinoma in situ (SCCIS) (01/09/2016), and Superficial nodular basal cell carcinoma (BCC) (01/09/2016).   He has a past surgical history that includes Knee arthroscopy (Right, 05/14/2010); Wisdom tooth extraction; epidural injections; Fixation kyphoplasty lumbar spine; Back surgery; Video assisted thoracoscopy (Left, 08/22/2016); Pleural effusion drainage (Left, 08/22/2016); and Rib plating (Left, 08/22/2016).   His family history includes Diabetes in his mother; Emphysema in  his father; Heart disease in his brother; Heart failure in his mother; Lung cancer in his father.He reports that he quit smoking about 2 years ago. His smoking use included cigarettes. He has a 67.50 pack-year smoking history. He has never used smokeless tobacco. He reports current alcohol use of about 42.0 standard drinks of alcohol per week. He reports that he does not use drugs.    ROS Review of Systems  Constitutional:  Negative for fever.  Respiratory:  Positive for shortness of breath.   Cardiovascular:  Negative for chest pain.  Musculoskeletal:  Negative for arthralgias.  Skin:  Negative for rash.   Objective:  BP 140/75   Pulse (!) 107   Temp 99.3 F (37.4 C)   Ht 5' 9"  (1.753 m)   Wt 221 lb 9.6 oz (100.5 kg)   SpO2 94%   BMI 32.72 kg/m   BP Readings from Last 3 Encounters:  10/25/20 140/75  10/03/20 (!) 152/83  09/16/20 (!) 170/88    Wt Readings from Last 3 Encounters:  10/25/20 221 lb 9.6 oz (100.5 kg)  10/03/20 222 lb 9.6 oz (101 kg)  09/16/20 222 lb 9.6 oz (101 kg)     Physical Exam Vitals reviewed.  Constitutional:      Appearance: He is well-developed.  HENT:     Head: Normocephalic and atraumatic.     Right Ear: External ear normal.     Left Ear: External ear normal.     Mouth/Throat:     Pharynx: No oropharyngeal exudate or posterior oropharyngeal erythema.  Eyes:     Pupils: Pupils are equal, round, and reactive to  light.  Cardiovascular:     Rate and Rhythm: Normal rate and regular rhythm.     Heart sounds: No murmur heard. Pulmonary:     Effort: No respiratory distress.     Breath sounds: Wheezing present.  Musculoskeletal:     Cervical back: Normal range of motion and neck supple.  Neurological:     Mental Status: He is alert and oriented to person, place, and time.      Assessment & Plan:   Frank Rosales was seen today for follow-up.  Diagnoses and all orders for this visit:  Diabetes mellitus, new onset ([place])  COPD GOLD III    Other orders -     dapagliflozin propanediol (FARXIGA) 10 MG TABS tablet; Take 1 tablet (10 mg total) by mouth daily before breakfast.      I am having Legrand Como A. Megna start on dapagliflozin propanediol. I am also having him maintain his aspirin, cholecalciferol, vitamin Z-61, folic acid, acetaminophen, albuterol, triamcinolone cream, Famotidine (PEPCID PO), vitamin C, albuterol, atorvastatin, levothyroxine, olmesartan, Dupixent, blood glucose meter kit and supplies, [employer], metFORMIN, and NIFEdipine.  Allergies as of 10/25/2020       Reactions   No Known Allergies         Medication List        Accurate as of October 25, 2020 10:16 AM. If you have any questions, ask your nurse or doctor.          acetaminophen 325 MG tablet Commonly known as: TYLENOL Take 2 tablets (650 mg total) by mouth every 6 (six) hours as needed.   albuterol (2.5 MG/3ML) 0.083% nebulizer solution Commonly known as: PROVENTIL Take 3 mLs (2.5 mg total) by nebulization every 6 (six) hours as needed for wheezing or shortness of breath.   albuterol 108 (90 Base) MCG/ACT inhaler Commonly known as: ProAir HFA Inhale 2 puffs into the lungs every 6 (six) hours.   aspirin 81 MG tablet Take 81 mg by mouth daily.   atorvastatin 40 MG tablet Commonly known as: LIPITOR TAKE 1 TABLET BY MOUTH  DAILY FOR CHOLESTEROL   blood glucose meter kit and supplies Kit Dispense based on patient and insurance preference. Use up to four times daily as directed. (FOR ICD-10 : E11.9   Breztri Aerosphere 160-9-4.8 MCG/ACT Aero Generic drug: Budeson-Glycopyrrol-Formoterol Inhale 2 puffs into the lungs 2 (two) times daily.   cholecalciferol 1000 units tablet Commonly known as: VITAMIN D Take 1,000 Units by mouth daily.   dapagliflozin propanediol 10 MG Tabs tablet Commonly known as: Farxiga Take 1 tablet (10 mg total) by mouth daily before breakfast. Started by: Claretta Fraise, MD   Dupixent 300 MG/2ML  prefilled syringe Generic drug: dupilumab Inject 300 mg into the skin every 14 (fourteen) days. Starting at day 15 for maintenance.   folic acid 1 MG tablet Commonly known as: FOLVITE Take 1 tablet (1 mg total) by mouth daily.   levothyroxine 50 MCG tablet Commonly known as: SYNTHROID 1 TABLET EVERY MORNING BEFORE BREAKFAST-WAIT 1 HR AFTER TAKING BEFORE EATING OR DRINKING   metFORMIN 750 MG 24 hr tablet Commonly known as: GLUCOPHAGE-XR Take 2 tablets (1,500 mg total) by mouth daily with breakfast.   NIFEdipine 60 MG 24 hr tablet Commonly known as: ADALAT CC Take 1 tablet (60 mg total) by mouth 2 (two) times daily.   olmesartan 40 MG tablet Commonly known as: BENICAR Take 1 tablet (40 mg total) by mouth daily. for blood pressure   PEPCID PO Take 1 tablet by  mouth daily.   triamcinolone cream 0.1 % Commonly known as: KENALOG   vitamin B-12 250 MCG tablet Commonly known as: CYANOCOBALAMIN Take 250 mcg by mouth daily.   vitamin C 100 MG tablet Take 100 mg by mouth daily.         Follow-up: Return in about 1 month (around 11/24/2020) for diabetes.  Claretta Fraise, M.D.

## 2020-10-27 ENCOUNTER — Other Ambulatory Visit: Payer: Self-pay | Admitting: *Deleted

## 2020-10-27 MED ORDER — DUPIXENT 300 MG/2ML ~~LOC~~ SOSY: 300.0000 mg | PREFILLED_SYRINGE | SUBCUTANEOUS | 2 refills | Status: DC

## 2020-11-01 ENCOUNTER — Other Ambulatory Visit: Payer: Self-pay | Admitting: Family Medicine

## 2020-11-01 DIAGNOSIS — E7800 Pure hypercholesterolemia, unspecified: Secondary | ICD-10-CM

## 2020-11-01 DIAGNOSIS — I10 Essential (primary) hypertension: Secondary | ICD-10-CM

## 2020-11-07 ENCOUNTER — Other Ambulatory Visit: Payer: Self-pay | Admitting: Family Medicine

## 2020-11-24 ENCOUNTER — Ambulatory Visit: Payer: Medicare Other | Admitting: Family Medicine

## 2020-11-28 ENCOUNTER — Ambulatory Visit (INDEPENDENT_AMBULATORY_CARE_PROVIDER_SITE_OTHER): Payer: Medicare Other

## 2020-11-28 ENCOUNTER — Telehealth: Payer: Self-pay

## 2020-11-28 VITALS — Ht 69.0 in | Wt 220.0 lb

## 2020-11-28 DIAGNOSIS — Z79899 Other long term (current) drug therapy: Secondary | ICD-10-CM

## 2020-11-28 DIAGNOSIS — Z0000 Encounter for general adult medical examination without abnormal findings: Secondary | ICD-10-CM

## 2020-11-28 DIAGNOSIS — E119 Type 2 diabetes mellitus without complications: Secondary | ICD-10-CM | POA: Diagnosis not present

## 2020-11-28 DIAGNOSIS — Z0001 Encounter for general adult medical examination with abnormal findings: Secondary | ICD-10-CM

## 2020-11-28 DIAGNOSIS — J449 Chronic obstructive pulmonary disease, unspecified: Secondary | ICD-10-CM | POA: Diagnosis not present

## 2020-11-28 NOTE — Chronic Care Management (AMB) (Signed)
  Chronic Care Management   Note  11/28/2020 Name: Frank Rosales MRN: [id number] DOB: 1956/04/10  Frank Rosales is a 65 y.o. year old male who is a primary care patient of Stacks, Cletus Gash, MD. I reached out to [name] by phone today in response to a referral sent by Frank Rosales patient's AWV (annual wellness visit) nurse, Hopkins, Amy E, LPN.      Frank Rosales was given information about Chronic Care Management services today including:  CCM service includes personalized support from designated clinical staff supervised by his physician, including individualized plan of care and coordination with other care providers 24/7 contact phone numbers for assistance for urgent and routine care needs. Service will only be billed when office clinical staff spend 20 minutes or more in a month to coordinate care. Only one practitioner may furnish and bill the service in a calendar month. The patient may stop CCM services at any time (effective at the end of the month) by phone call to the office staff. The patient will be responsible for cost sharing (co-pay) of up to 20% of the service fee (after annual deductible is met).  Patient agreed to services and verbal consent obtained.   Follow up plan: Telephone appointment with care management team member scheduled for:12/13/2020  Noreene Larsson, [place], [place], [place] [address] [id number] Direct Dial: [phone] [email or web address]: [email or web address]

## 2020-11-28 NOTE — Patient Instructions (Signed)
[name] , Thank you for taking time to come for your Medicare Wellness Visit. I appreciate your ongoing commitment to your health goals. Please review the following plan we discussed and let me know if I can assist you in the future.   Screening recommendations/referrals: Colonoscopy: Done 02/22/2013 - Repeat in 10 years  Recommended yearly ophthalmology/optometry visit for glaucoma screening and checkup Recommended yearly dental visit for hygiene and checkup  Vaccinations: Influenza vaccine: Done 02/02/2020 - Repeat annually  Pneumococcal vaccine: Done 04/14/2013 & 02/21/2015 Tdap vaccine: Done 02/06/2013 - Repeat in 10 years  Shingles vaccine: Due. Shingrix discussed. Please contact your pharmacy for coverage information.     Covid-19: Done 08/05/19, 08/31/19, 04/19/20, & 10/18/2020  Advanced directives: Please bring a copy of your health care power of attorney and living will to the office to be added to your chart at your convenience.   Conditions/risks identified: Try to increase physical activity to 30 minutes per day as tolerated - this may need to be broken up into 5-10 minute intervals. Work on healthy diabetic diet and drink 6-8 glasses of water daily. See diet tips at the end of summary  Next appointment: Follow up in one year for your annual wellness visit   Preventive Care 40-64 Years, Male Preventive care refers to lifestyle choices and visits with your health care provider that can promote health and wellness. What does preventive care include? A yearly physical exam. This is also called an annual well check. Dental exams once or twice a year. Routine eye exams. Ask your health care provider how often you should have your eyes checked. Personal lifestyle choices, including: Daily care of your teeth and gums. Regular physical activity. Eating a healthy diet. Avoiding tobacco and drug use. Limiting alcohol use. Practicing safe sex. Taking low-dose aspirin every day starting  at age 56. What happens during an annual well check? The services and screenings done by your health care provider during your annual well check will depend on your age, overall health, lifestyle risk factors, and family history of disease. Counseling  Your health care provider may ask you questions about your: Alcohol use. Tobacco use. Drug use. Emotional well-being. Home and relationship well-being. Sexual activity. Eating habits. Work and work Statistician. Screening  You may have the following tests or measurements: Height, weight, and BMI. Blood pressure. Lipid and cholesterol levels. These may be checked every 5 years, or more frequently if you are over 5 years old. Skin check. Lung cancer screening. You may have this screening every year starting at age 26 if you have a 30-pack-year history of smoking and currently smoke or have quit within the past 15 years. Fecal occult blood test (FOBT) of the stool. You may have this test every year starting at age 62. Flexible sigmoidoscopy or colonoscopy. You may have a sigmoidoscopy every 5 years or a colonoscopy every 10 years starting at age 88. Prostate cancer screening. Recommendations will vary depending on your family history and other risks. Hepatitis C blood test. Hepatitis B blood test. Sexually transmitted disease (STD) testing. Diabetes screening. This is done by checking your blood sugar (glucose) after you have not eaten for a while (fasting). You may have this done every 1-3 years. Discuss your test results, treatment options, and if necessary, the need for more tests with your health care provider. Vaccines  Your health care provider may recommend certain vaccines, such as: Influenza vaccine. This is recommended every year. Tetanus, diphtheria, and acellular pertussis (Tdap, Td) vaccine.  You may need a Td booster every 10 years. Zoster vaccine. You may need this after age 73. Pneumococcal 13-valent conjugate (PCV13)  vaccine. You may need this if you have certain conditions and have not been vaccinated. Pneumococcal polysaccharide (PPSV23) vaccine. You may need one or two doses if you smoke cigarettes or if you have certain conditions. Talk to your health care provider about which screenings and vaccines you need and how often you need them. This information is not intended to replace advice given to you by your health care provider. Make sure you discuss any questions you have with your health care provider. Document Released: 05/27/2015 Document Revised: 01/18/2016 Document Reviewed: 03/01/2015 Elsevier Interactive Patient Education  2017 [place] Prevention in the Home Falls can cause injuries. They can happen to people of all ages. There are many things you can do to make your home safe and to help prevent falls. What can I do on the outside of my home? Regularly fix the edges of walkways and driveways and fix any cracks. Remove anything that might make you trip as you walk through a door, such as a raised step or threshold. Trim any bushes or trees on the path to your home. Use bright outdoor lighting. Clear any walking paths of anything that might make someone trip, such as rocks or tools. Regularly check to see if handrails are loose or broken. Make sure that both sides of any steps have handrails. Any raised decks and porches should have guardrails on the edges. Have any leaves, snow, or ice cleared regularly. Use sand or salt on walking paths during winter. Clean up any spills in your garage right away. This includes oil or grease spills. What can I do in the bathroom? Use night lights. Install grab bars by the toilet and in the tub and shower. Do not use towel bars as grab bars. Use non-skid mats or decals in the tub or shower. If you need to sit down in the shower, use a plastic, non-slip stool. Keep the floor dry. Clean up any water that spills on the floor as soon as it  happens. Remove soap buildup in the tub or shower regularly. Attach bath mats securely with double-sided non-slip rug tape. Do not have throw rugs and other things on the floor that can make you trip. What can I do in the bedroom? Use night lights. Make sure that you have a light by your bed that is easy to reach. Do not use any sheets or blankets that are too big for your bed. They should not hang down onto the floor. Have a firm chair that has side arms. You can use this for support while you get dressed. Do not have throw rugs and other things on the floor that can make you trip. What can I do in the kitchen? Clean up any spills right away. Avoid walking on wet floors. Keep items that you use a lot in easy-to-reach places. If you need to reach something above you, use a strong step stool that has a grab bar. Keep electrical cords out of the way. Do not use floor polish or wax that makes floors slippery. If you must use wax, use non-skid floor wax. Do not have throw rugs and other things on the floor that can make you trip. What can I do with my stairs? Do not leave any items on the stairs. Make sure that there are handrails on both sides of the stairs and  use them. Fix handrails that are broken or loose. Make sure that handrails are as long as the stairways. Check any carpeting to make sure that it is firmly attached to the stairs. Fix any carpet that is loose or worn. Avoid having throw rugs at the top or bottom of the stairs. If you do have throw rugs, attach them to the floor with carpet tape. Make sure that you have a light switch at the top of the stairs and the bottom of the stairs. If you do not have them, ask someone to add them for you. What else can I do to help prevent falls? Wear shoes that: Do not have high heels. Have rubber bottoms. Are comfortable and fit you well. Are closed at the toe. Do not wear sandals. If you use a stepladder: Make sure that it is fully opened.  Do not climb a closed stepladder. Make sure that both sides of the stepladder are locked into place. Ask someone to hold it for you, if possible. Clearly mark and make sure that you can see: Any grab bars or handrails. First and last steps. Where the edge of each step is. Use tools that help you move around (mobility aids) if they are needed. These include: Canes. Walkers. Scooters. Crutches. Turn on the lights when you go into a dark area. Replace any light bulbs as soon as they burn out. Set up your furniture so you have a clear path. Avoid moving your furniture around. If any of your floors are uneven, fix them. If there are any pets around you, be aware of where they are. Review your medicines with your doctor. Some medicines can make you feel dizzy. This can increase your chance of falling. Ask your doctor what other things that you can do to help prevent falls. This information is not intended to replace advice given to you by your health care provider. Make sure you discuss any questions you have with your health care provider. Document Released: 02/24/2009 Document Revised: 10/06/2015 Document Reviewed: 06/04/2014 Elsevier Interactive Patient Education  2017 [place].   Diabetes Mellitus and Nutrition, Adult When you have diabetes, or diabetes mellitus, it is very important to have healthy eating habits because your blood sugar (glucose) levels are greatly affected by what you eat and drink. Eating healthy foods in the right amounts, at about the same times every day, can help you: Control your blood glucose. Lower your risk of heart disease. Improve your blood pressure. Reach or maintain a healthy weight. What can affect my meal plan? Every person with diabetes is different, and each person has different needs for a meal plan. Your health care provider may recommend that you work with a dietitian to make a meal plan that is best for you. Your meal plan may vary depending on  factors such as: The calories you need. The medicines you take. Your weight. Your blood glucose, blood pressure, and cholesterol levels. Your activity level. Other health conditions you have, such as heart or kidney disease. How do carbohydrates affect me? Carbohydrates, also called carbs, affect your blood glucose level more than any other type of food. Eating carbs naturally raises the amount of glucose in your blood. Carb counting is a method for keeping track of how many carbs you eat. Counting carbs is important to keep your blood glucose at a healthy level,especially if you use insulin or take certain oral diabetes medicines. It is important to know how many carbs you can safely have  in each meal. This is different for every person. Your dietitian can help you calculate how manycarbs you should have at each meal and for each snack. How does alcohol affect me? Alcohol can cause a sudden decrease in blood glucose (hypoglycemia), especially if you use insulin or take certain oral diabetes medicines. Hypoglycemia can be a life-threatening condition. Symptoms of hypoglycemia, such as sleepiness, dizziness, and confusion, are similar to symptoms of having too much alcohol. Do not drink alcohol if: Your health care provider tells you not to drink. You are pregnant, may be pregnant, or are planning to become pregnant. If you drink alcohol: Do not drink on an empty stomach. Limit how much you use to: 0-1 drink a day for women. 0-2 drinks a day for men. Be aware of how much alcohol is in your drink. In the [place] one drink equals one 12 oz bottle of beer (355 mL), one 5 oz glass of wine (148 mL), or one 1 oz glass of hard liquor (44 mL). Keep yourself hydrated with water, diet soda, or unsweetened iced tea. Keep in mind that regular soda, juice, and other mixers may contain a lot of sugar and must be counted as carbs. What are tips for following this plan?  Reading food labels Start by checking  the serving size on the "Nutrition Facts" label of packaged foods and drinks. The amount of calories, carbs, fats, and other nutrients listed on the label is based on one serving of the item. Many items contain more than one serving per package. Check the total grams (g) of carbs in one serving. You can calculate the number of servings of carbs in one serving by dividing the total carbs by 15. For example, if a food has 30 g of total carbs per serving, it would be equal to 2 servings of carbs. Check the number of grams (g) of saturated fats and trans fats in one serving. Choose foods that have a low amount or none of these fats. Check the number of milligrams (mg) of salt (sodium) in one serving. Most people should limit total sodium intake to less than 2,300 mg per day. Always check the nutrition information of foods labeled as "low-fat" or "nonfat." These foods may be higher in added sugar or refined carbs and should be avoided. Talk to your dietitian to identify your daily goals for nutrients listed on the label. Shopping Avoid buying canned, pre-made, or processed foods. These foods tend to be high in fat, sodium, and added sugar. Shop around the outside edge of the grocery store. This is where you will most often find fresh fruits and vegetables, bulk grains, fresh meats, and fresh dairy. Cooking Use low-heat cooking methods, such as baking, instead of high-heat cooking methods like deep frying. Cook using healthy oils, such as olive, canola, or sunflower oil. Avoid cooking with butter, cream, or high-fat meats. Meal planning Eat meals and snacks regularly, preferably at the same times every day. Avoid going long periods of time without eating. Eat foods that are high in fiber, such as fresh fruits, vegetables, beans, and whole grains. Talk with your dietitian about how many servings of carbs you can eat at each meal. Eat 4-6 oz (112-168 g) of lean protein each day, such as lean meat, chicken,  fish, eggs, or tofu. One ounce (oz) of lean protein is equal to: 1 oz (28 g) of meat, chicken, or fish. 1 egg.  cup (62 g) of tofu. Eat some foods each day that  contain healthy fats, such as avocado, nuts, seeds, and fish. What foods should I eat? Fruits Berries. Apples. Oranges. Peaches. Apricots. Plums. Grapes. Mango. Papaya.Pomegranate. Kiwi. Cherries. Vegetables Lettuce. Spinach. Leafy greens, including kale, chard, collard greens, and mustard greens. Beets. Cauliflower. Cabbage. Broccoli. Carrots. Green beans.Tomatoes. Peppers. Onions. Cucumbers. Brussels sprouts. Grains Whole grains, such as whole-wheat or whole-grain bread, crackers, tortillas,cereal, and pasta. Unsweetened oatmeal. Quinoa. Brown or wild rice. Meats and other proteins Seafood. Poultry without skin. Lean cuts of poultry and beef. Tofu. Nuts. Seeds. Dairy Low-fat or fat-free dairy products such as milk, yogurt, and cheese. The items listed above may not be a complete list of foods and beverages you can eat. Contact a dietitian for more information. What foods should I avoid? Fruits Fruits canned with syrup. Vegetables Canned vegetables. Frozen vegetables with butter or cream sauce. Grains Refined white flour and flour products such as bread, pasta, snack foods, andcereals. Avoid all processed foods. Meats and other proteins Fatty cuts of meat. Poultry with skin. Breaded or fried meats. Processed meat.Avoid saturated fats. Dairy Full-fat yogurt, cheese, or milk. Beverages Sweetened drinks, such as soda or iced tea. The items listed above may not be a complete list of foods and beverages you should avoid. Contact a dietitian for more information. Questions to ask a health care provider Do I need to meet with a diabetes educator? Do I need to meet with a dietitian? What number can I call if I have questions? When are the best times to check my blood glucose? Where to find more information: American Diabetes  Association: diabetes.org Academy of Nutrition and Dietetics: www.eatright.[employer] of Diabetes and Digestive and Kidney Diseases: [employer] Association of Diabetes Care and Education Specialists: www.diabeteseducator.org Summary It is important to have healthy eating habits because your blood sugar (glucose) levels are greatly affected by what you eat and drink. A healthy meal plan will help you control your blood glucose and maintain a healthy lifestyle. Your health care provider may recommend that you work with a dietitian to make a meal plan that is best for you. Keep in mind that carbohydrates (carbs) and alcohol have immediate effects on your blood glucose levels. It is important to count carbs and to use alcohol carefully. This information is not intended to replace advice given to you by your health care provider. Make sure you discuss any questions you have with your healthcare provider. Document Revised: 04/07/2019 Document Reviewed: 04/07/2019 Elsevier Patient Education  2021 [employer].

## 2020-11-28 NOTE — Progress Notes (Signed)
Subjective:   Frank Rosales is a 65 y.o. male who presents for Medicare Annual/Subsequent preventive examination.  Virtual Visit via Telephone Note  I connected with  Frank Rosales on 11/28/20 at 11:15 AM EDT by telephone and verified that I am speaking with the correct person using two identifiers.  Location: Patient: Home Provider: [facility] Persons participating in the virtual visit: patient/Nurse Health Advisor   I discussed the limitations, risks, security and privacy concerns of performing an evaluation and management service by telephone and the availability of in person appointments. The patient expressed understanding and agreed to proceed.  Interactive audio and video telecommunications were attempted between this nurse and patient, however failed, due to patient having technical difficulties OR patient did not have access to video capability.  We continued and completed visit with audio only.  Some vital signs may be absent or patient reported.   Frank Rosales Frank Dwanda Tufano, LPN   Review of Systems     Cardiac Risk Factors include: advanced age (>74mn, >>54women);diabetes mellitus;dyslipidemia;hypertension;family history of premature cardiovascular disease;male gender;obesity (BMI >30kg/m2);sedentary lifestyle;smoking/ tobacco exposure;Other (see comment), Risk factor comments: COPD     Objective:    Today's Vitals   11/28/20 1121  Weight: 220 lb (99.8 kg)  Height: 5' 9"  (1.753 m)  PainSc: 3    Body mass index is 32.49 kg/m.  Advanced Directives 11/28/2020 07/21/2019 07/18/2018 06/24/2018 06/04/2018 08/20/2016 08/06/2016  Does Patient Have a Medical Advance Directive? Yes Yes No No No No No  Type of [place] will Living will;Healthcare Power of Attorney - - - - -  Does patient want to make changes to medical advance directive? - No - Patient declined - - - - -  Copy of HG[place]in Chart? No - copy requested - - - - - -   Would patient like information on creating a medical advance directive? - - Yes (MAU/Ambulatory/Procedural Areas - Information given) - No - Patient declined No - Patient declined No - Patient declined    Current Medications (verified) Outpatient Encounter Medications as of 11/28/2020  Medication Sig   acetaminophen (TYLENOL) 325 MG tablet Take 2 tablets (650 mg total) by mouth every 6 (six) hours as needed.   albuterol (PROAIR HFA) 108 (90 Base) MCG/ACT inhaler Inhale 2 puffs into the lungs every 6 (six) hours.   albuterol (PROVENTIL) (2.5 MG/3ML) 0.083% nebulizer solution Take 3 mLs (2.5 mg total) by nebulization every 6 (six) hours as needed for wheezing or shortness of breath.   Ascorbic Acid (VITAMIN C) 100 MG tablet Take 100 mg by mouth daily.   aspirin 81 MG tablet Take 81 mg by mouth daily.    atorvastatin (LIPITOR) 40 MG tablet TAKE 1 TABLET BY MOUTH  DAILY FOR CHOLESTEROL   blood glucose meter kit and supplies KIT Dispense based on patient and insurance preference. Use up to four times daily as directed. (FOR ICD-10 : E11.9   Budeson-Glycopyrrol-Formoterol (BREZTRI AEROSPHERE) 160-9-4.8 MCG/ACT AERO Inhale 2 puffs into the lungs 2 (two) times daily.   cholecalciferol (VITAMIN D) 1000 UNITS tablet Take 1,000 Units by mouth daily.    dapagliflozin propanediol (FARXIGA) 10 MG TABS tablet Take 1 tablet (10 mg total) by mouth daily before breakfast.   dupilumab (DUPIXENT) 300 MG/2ML prefilled syringe Inject 300 mg into the skin every 14 (fourteen) days. Starting at day 15 for maintenance.   Famotidine (PEPCID PO) Take 1 tablet by mouth daily.   folic acid (  FOLVITE) 1 MG tablet Take 1 tablet (1 mg total) by mouth daily.   levothyroxine (SYNTHROID) 50 MCG tablet 1 TABLET EVERY MORNING BEFORE BREAKFAST-WAIT 1 HR AFTER TAKING BEFORE EATING OR DRINKING   metFORMIN (GLUCOPHAGE-XR) 750 MG 24 hr tablet Take 2 tablets (1,500 mg total) by mouth daily with breakfast.   NIFEdipine (ADALAT CC) 60 MG 24  hr tablet Take 1 tablet (60 mg total) by mouth 2 (two) times daily.   olmesartan (BENICAR) 40 MG tablet TAKE 1 TABLET BY MOUTH  DAILY FOR BLOOD PRESSURE   OneTouch Delica Lancets 46F MISC Apply topically.   ONETOUCH VERIO test strip SMARTSIG:Via Meter 1-4 Times Daily   triamcinolone cream (KENALOG) 0.1 %    vitamin B-12 (CYANOCOBALAMIN) 250 MCG tablet Take 250 mcg by mouth daily.   No facility-administered encounter medications on file as of 11/28/2020.    Allergies (verified) No known allergies   History: Past Medical History:  Diagnosis Date   Anxiety    Arthritis    "neck; lower back; right hip; right knee" (10/18/2014)   Asthma    Atypical nevus 03/27/2011   Mid Back-Severe (Exc)   Atypical nevus 01/20/2007   [facility] Flank[facility])   Colon polyps    COPD (chronic obstructive pulmonary disease) (HCC)    Diabetes mellitus without complication (HCC)    High blood pressure    High cholesterol    Hilar adenopathy    Hypothyroidism    Multiple rib fractures 08/2016   left side   Nodular basal cell carcinoma (BCC) 02/18/2020   Left Temporal Scalp   SCCA (squamous cell carcinoma) of skin 02/18/2020   Left Nasal Sidewall (well diff)   SCCA (squamous cell carcinoma) of skin 02/18/2020   Right Temple (well diff)   SCCA (squamous cell carcinoma) of skin 02/18/2020   Left Temple (well diff)   Squamous cell carcinoma in situ (SCCIS) 01/09/2016   Right Low Lip Med (Cx3,5FU,LN2) and Top Scalp Sup (Cx3,5FU)   Superficial nodular basal cell carcinoma (BCC) 01/09/2016   Left Sideburn(Exc)   Past Surgical History:  Procedure Laterality Date   BACK SURGERY     epidural injections     FIXATION KYPHOPLASTY LUMBAR SPINE     "L1"   KNEE ARTHROSCOPY Right 05/14/2010   PLEURAL EFFUSION DRAINAGE Left 08/22/2016   Procedure: DRAINAGE OF HEMOTHORAX;  Surgeon: [name] Nakayama, MD;  Location: [facility] OR;  Service: Thoracic;  Laterality: Left;   RIB PLATING Left 08/22/2016    Procedure: RIB PLATING;  Surgeon: [name] Nakayama, MD;  Location: [facility] OR;  Service: Thoracic;  Laterality: Left;   VIDEO ASSISTED THORACOSCOPY Left 08/22/2016   Procedure: VIDEO ASSISTED THORACOSCOPY;  Surgeon: [name] Nakayama, MD;  Location: [facility] OR;  Service: Thoracic;  Laterality: Left;   WISDOM TOOTH EXTRACTION     Family History  Problem Relation Age of Onset   Emphysema Father    Lung cancer Father    Heart disease Brother    Heart failure Mother    Diabetes Mother    Social History   Socioeconomic History   Marital status: Married    Spouse name: Lattie Haw   Number of children: 1   Years of education: Not on file   Highest education level: Associate degree: occupational, Hotel manager, or vocational program  Occupational History   Occupation: Company secretary: [employer]    Comment: retired  Tobacco Use   Smoking status: Former    Packs/day: 1.50  Years: 45.00    Pack years: 67.50    Types: Cigarettes    Quit date: 11/11/2017    Years since quitting: 3.0   Smokeless tobacco: Never   Tobacco comments:        Vaping Use   Vaping Use: Never used  Substance and Sexual Activity   Alcohol use: Yes    Alcohol/week: 18.0 standard drinks    Types: 18 Cans of beer per week    Comment: 11/28/20 - trying to cut back/quit - down to 2-3 per day   Drug use: No   Sexual activity: Not Currently  Other Topics Concern   Not on file  Social History Narrative   Disabled      Lives with wife      [place]      Asbestosis, COPD - chronic SOB      Alcoholic - trying to quit; He did quit smoking 2019   Social Determinants of Health   Financial Resource Strain: Low Risk    Difficulty of Paying Living Expenses: Not hard at all  Food Insecurity: No Food Insecurity   Worried About Charity fundraiser in the Last Year: Never true   Arboriculturist in the Last Year: Never true  Transportation Needs: No Transportation Needs   Lack of Transportation (Medical): No   Lack of  Transportation (Non-Medical): No  Physical Activity: Inactive   Days of Exercise per Week: 0 days   Minutes of Exercise per Session: 0 min  Stress: Stress Concern Present   Feeling of Stress : To some extent  Social Connections: Moderately Integrated   Frequency of Communication with Friends and Family: More than three times a week   Frequency of Social Gatherings with Friends and Family: More than three times a week   Attends Religious Services: 1 to 4 times per year   Active Member of [employer] or Organizations: No   Attends Music therapist: Never   Marital Status: Married    Tobacco Counseling Counseling given: Not Answered Tobacco comments:      Clinical Intake:  Pre-visit preparation completed: Yes  Pain : 0-10 Pain Score: 3  Pain Type: Chronic pain Pain Location: Generalized Pain Descriptors / Indicators: Aching, Sore, Discomfort Pain Onset: More than a month ago Pain Frequency: Intermittent     BMI - recorded: 32.49 Nutritional Status: BMI > 30  Obese Nutritional Risks: None Diabetes: No  How often do you need to have someone help you when you read instructions, pamphlets, or other written materials from your doctor or pharmacy?: 1 - Never  Nutrition Risk Assessment:  Has the patient had any N/V/D within the last 2 months?  No  Does the patient have any non-healing wounds?  No  Has the patient had any unintentional weight loss or weight gain?  No   Diabetes:  Is the patient diabetic?  Yes  If diabetic, was a CBG obtained today?  No  Did the patient bring in their glucometer from home?  No  How often do you monitor your CBG's? BID - 156 this am per patient.   Financial Strains and Diabetes Management:  Are you having any financial strains with the device, your supplies or your medication? No .  Does the patient want to be seen by Chronic Care Management for management of their diabetes?  No  Would the patient like to be referred to a  Nutritionist or for Diabetic Management?  No   Diabetic Exams:  Diabetic Eye Exam: Completed 2019. Overdue for diabetic eye exam. Pt has been advised about the importance in completing this exam. Will make appt to see Dr Katy Fitch soon.  Diabetic Foot Exam: Completed unknown. Pt has been advised about the importance in completing this exam. Pt is scheduled for diabetic foot exam on 11/29/20.    Interpreter Needed?: No  Information entered by :: Kenon Delashmit, LPN   Activities of Daily Living In your present state of health, do you have any difficulty performing the following activities: 11/28/2020  Hearing? N  Vision? N  Difficulty concentrating or making decisions? N  Walking or climbing stairs? Y  Dressing or bathing? N  Doing errands, shopping? N  Preparing Food and eating ? N  Using the Toilet? N  In the past six months, have you accidently leaked urine? N  Do you have problems with loss of bowel control? N  Managing your Medications? N  Managing your Finances? N  Housekeeping or managing your Housekeeping? N  Some recent data might be hidden    Patient Care Team: Claretta Fraise, MD as PCP - General (Family Medicine) Tanda Rockers, MD as Consulting Physician (Pulmonary Disease) Lavonna Monarch, MD as Consulting Physician (Dermatology) [facility]  Indicate any recent Medical Services you may have received from other than [facility] providers in the past year (date may be approximate).     Assessment:   This is a routine wellness examination for Jafeth.  Hearing/Vision screen Hearing Screening - Comments:: Denies hearing difficulties  Vision Screening - Comments:: Wears reading glasses only prn - behind on annual eye exams with Dr Katy Fitch  Dietary issues and exercise activities discussed: Current Exercise Habits: The patient does not participate in regular exercise at present, Exercise limited by: respiratory conditions(s);orthopedic condition(s)   Goals  Addressed             This Visit's Progress    Exercise 3x per week (30 min per time)   Not on track    Reduce alcohol intake   On track    Do other activities such as visit a friend, taking a drive, exercising when feeling the urge to drink alcohol.         Depression Screen PHQ 2/9 Scores 11/28/2020 10/25/2020 10/03/2020 09/01/2020 08/11/2020 08/01/2020 02/02/2020  PHQ - 2 Score 0 0 0 0 0 0 0  PHQ- 9 Score - - - - - - -    Fall Risk Fall Risk  11/28/2020 10/25/2020 10/03/2020 09/01/2020 08/11/2020  Falls in the past year? 0 0 0 0 0  Number falls in past yr: 0 - - 0 -  Injury with Fall? 0 - - 0 -  Risk for fall due to : Orthopedic patient - - - -  Follow up Falls prevention discussed - - - -    FALL [place]:  Any stairs in or around the home? No  If so, are there any without handrails? No  Home free of loose throw rugs in walkways, pet beds, electrical cords, etc? Yes  Adequate lighting in your home to reduce risk of falls? Yes   ASSISTIVE DEVICES UTILIZED TO PREVENT FALLS:  Life alert? No  Use of a cane, walker or w/c? No  Grab bars in the bathroom? Yes  Shower chair or bench in shower? Yes  Elevated toilet seat or a handicapped toilet? Yes   TIMED UP AND GO:  Was the test performed? No . Telephonic visit  Cognitive Function: MMSE - Mini Mental State Exam 07/18/2018  Orientation to time 5  Orientation to Place 5  Registration 3  Attention/ Calculation 5  Recall 3  Language- name 2 objects 2  Language- repeat 1  Language- follow 3 step command 3  Language- read & follow direction 1  Write a sentence 1  Copy design 0  Total score 29     6CIT Screen 07/21/2019  What Year? 0 points  What time? 0 points  Count back from 20 0 points  Months in reverse 0 points  Repeat phrase 0 points    Immunizations Immunization History  Administered Date(s) Administered   Influenza Split 02/12/2012, 02/11/2013   Influenza Whole 03/12/2011    Influenza,inj,Quad PF,6+ Mos 02/21/2015, 03/07/2016, 03/06/2017, 02/20/2018, 03/10/2019, 02/02/2020   Moderna SARS-COV2 Booster Vaccination 04/19/2020, 10/18/2020   Moderna Sars-Covid-2 Vaccination 08/05/2019, 08/31/2019   Pneumococcal Conjugate-13 02/21/2015   Pneumococcal Polysaccharide-23 04/14/2013   Td 05/14/2002   Tdap 02/06/2013    TDAP status: Up to date  Flu Vaccine status: Up to date  Pneumococcal vaccine status: Up to date  Covid-19 vaccine status: Completed vaccines  Qualifies for Shingles Vaccine? Yes   Zostavax completed No   Shingrix Completed?: No.    Education has been provided regarding the importance of this vaccine. Patient has been advised to call insurance company to determine out of pocket expense if they have not yet received this vaccine. Advised may also receive vaccine at local pharmacy or Health Dept. Verbalized acceptance and understanding.  Screening Tests Health Maintenance  Topic Date Due   OPHTHALMOLOGY EXAM  Never done   Zoster Vaccines- Shingrix (1 of 2) 01/25/2021 (Originally 12/28/1974)   FOOT EXAM  10/03/2021 (Originally 12/29/1965)   Pneumococcal Vaccine 29-70 Years old (3 - PPSV23 or PCV20) 10/25/2021 (Originally 04/14/2018)   INFLUENZA VACCINE  12/12/2020   HEMOGLOBIN A1C  02/01/2021   COVID-19 Vaccine (5 - Booster for Moderna series) 02/17/2021   TETANUS/TDAP  02/07/2023   COLONOSCOPY (Pts 45-21yr Insurance coverage will need to be confirmed)  02/23/2023   PNEUMOCOCCAL POLYSACCHARIDE VACCINE AGE 31-64 HIGH RISK  Completed   Hepatitis C Screening  Completed   HIV Screening  Completed   HPV VACCINES  Aged Out    Health Maintenance  Health Maintenance Due  Topic Date Due   OPHTHALMOLOGY EXAM  Never done    Colorectal cancer screening: Type of screening: Colonoscopy. Completed 02/22/2013. Repeat every 10 years  Lung Cancer Screening: (Low Dose CT Chest recommended if Age 63-80years, 30 pack-year currently smoking OR have quit w/in  15years.) does qualify.   Lung Cancer Screening Referral:  [place]  Additional Screening:  Hepatitis C Screening: does qualify; Completed 12/05/2015  Vision Screening: Recommended annual ophthalmology exams for early detection of glaucoma and other disorders of the eye. Is the patient up to date with their annual eye exam?  No  Who is the provider or what is the name of the office in which the patient attends annual eye exams? [facility] If pt is not established with a provider, would they like to be referred to a provider to establish care? No .   Dental Screening: Recommended annual dental exams for proper oral hygiene  Community Resource Referral / Chronic Care Management: CRR required this visit?  No   CCM required this visit?  No      Plan:     I have personally  reviewed and noted the following in the patient's chart:   Medical and social history Use of alcohol, tobacco or illicit drugs  Current medications and supplements including opioid prescriptions. Patient is not currently taking opioid prescriptions. Functional ability and status Nutritional status Physical activity Advanced directives List of other physicians Hospitalizations, surgeries, and ER visits in previous 12 months Vitals Screenings to include cognitive, depression, and falls Referrals and appointments  In addition, I have reviewed and discussed with patient certain preventive protocols, quality metrics, and best practice recommendations. A written personalized care plan for preventive services as well as general preventive health recommendations were provided to patient.     Sandrea Hammond, LPN   [id number]   Nurse Notes: None

## 2020-11-29 ENCOUNTER — Ambulatory Visit (INDEPENDENT_AMBULATORY_CARE_PROVIDER_SITE_OTHER): Payer: Medicare Other | Admitting: Family Medicine

## 2020-11-29 ENCOUNTER — Other Ambulatory Visit: Payer: Self-pay

## 2020-11-29 ENCOUNTER — Encounter: Payer: Self-pay | Admitting: Family Medicine

## 2020-11-29 VITALS — BP 158/88 | HR 108 | Temp 98.8°F | Ht 69.0 in | Wt 212.0 lb

## 2020-11-29 DIAGNOSIS — E782 Mixed hyperlipidemia: Secondary | ICD-10-CM

## 2020-11-29 DIAGNOSIS — I10 Essential (primary) hypertension: Secondary | ICD-10-CM

## 2020-11-29 DIAGNOSIS — E7800 Pure hypercholesterolemia, unspecified: Secondary | ICD-10-CM

## 2020-11-29 DIAGNOSIS — E119 Type 2 diabetes mellitus without complications: Secondary | ICD-10-CM | POA: Diagnosis not present

## 2020-11-29 DIAGNOSIS — E039 Hypothyroidism, unspecified: Secondary | ICD-10-CM

## 2020-11-29 LAB — BAYER DCA HB A1C WAIVED: HB A1C (BAYER DCA - WAIVED): 5.9 % (ref <7.0)

## 2020-11-29 MED ORDER — ATORVASTATIN CALCIUM 40 MG PO TABS: ORAL_TABLET | ORAL | 2 refills | Status: DC

## 2020-11-29 NOTE — Progress Notes (Signed)
Subjective:  Patient ID: Frank Rosales, male    DOB: Jan 09, 1956  Age: 65 y.o. MRN: [id number]  CC: Diabetes   HPI Frank Rosales presents for presents forFollow-up of diabetes. Patient checks blood sugar at home.   125-150 fasting and 102-145 postprandial since starting the [place]. Also cut back on eating. Eating a lot of fresh tomatoes. Patient denies symptoms such as polyuria, polydipsia, excessive hunger, nausea No significant hypoglycemic spells noted. Medications reviewed. Pt reports taking them regularly without complication/adverse reaction being reported today.     Depression screen [facility] 2/9 11/29/2020 11/28/2020 10/25/2020  Decreased Interest 0 0 0  Down, Depressed, Hopeless 0 0 0  PHQ - 2 Score 0 0 0  Altered sleeping - - -  Tired, decreased energy - - -  Change in appetite - - -  Feeling bad or failure about yourself  - - -  Trouble concentrating - - -  Moving slowly or fidgety/restless - - -  Suicidal thoughts - - -  PHQ-9 Score - - -  Some recent data might be hidden    History Frank Rosales has a past medical history of Anxiety, Arthritis, Asthma, Atypical nevus (03/27/2011), Atypical nevus (01/20/2007), Colon polyps, COPD (chronic obstructive pulmonary disease) ([place]), Diabetes mellitus without complication ([place]), High blood pressure, High cholesterol, Hilar adenopathy, Hypothyroidism, Multiple rib fractures (08/2016), Nodular basal cell carcinoma (BCC) (02/18/2020), SCCA (squamous cell carcinoma) of skin (02/18/2020), SCCA (squamous cell carcinoma) of skin (02/18/2020), SCCA (squamous cell carcinoma) of skin (02/18/2020), Squamous cell carcinoma in situ (SCCIS) (01/09/2016), and Superficial nodular basal cell carcinoma (BCC) (01/09/2016).   He has a past surgical history that includes Knee arthroscopy (Right, 05/14/2010); Wisdom tooth extraction; epidural injections; Fixation kyphoplasty lumbar spine; Back surgery; Video assisted thoracoscopy (Left, 08/22/2016); Pleural  effusion drainage (Left, 08/22/2016); and Rib plating (Left, 08/22/2016).   His family history includes Diabetes in his mother; Emphysema in his father; Heart disease in his brother; Heart failure in his mother; Lung cancer in his father.He reports that he quit smoking about 3 years ago. His smoking use included cigarettes. He has a 67.50 pack-year smoking history. He has never used smokeless tobacco. He reports current alcohol use of about 18.0 standard drinks of alcohol per week. He reports that he does not use drugs.    ROS Review of Systems  Constitutional:  Negative for fever.  Respiratory:  Positive for shortness of breath.   Cardiovascular:  Negative for chest pain.  Musculoskeletal:  Negative for arthralgias.  Skin:  Negative for rash.   Objective:  BP (!) 158/88   Pulse (!) 108   Temp 98.8 F (37.1 C)   Ht 5' 9"  (1.753 m)   Wt 212 lb (96.2 kg)   SpO2 92%   BMI 31.31 kg/m   BP Readings from Last 3 Encounters:  11/29/20 (!) 158/88  10/25/20 140/75  10/03/20 (!) 152/83    Wt Readings from Last 3 Encounters:  11/29/20 212 lb (96.2 kg)  11/28/20 220 lb (99.8 kg)  10/25/20 221 lb 9.6 oz (100.5 kg)     Physical Exam Vitals reviewed.  Constitutional:      General: He is not in acute distress.    Appearance: He is well-developed. He is obese. He is ill-appearing.  HENT:     Head: Normocephalic and atraumatic.     Right Ear: External ear normal.     Left Ear: External ear normal.     Mouth/Throat:     Pharynx: No oropharyngeal exudate  or posterior oropharyngeal erythema.  Eyes:     Pupils: Pupils are equal, round, and reactive to light.  Cardiovascular:     Rate and Rhythm: Normal rate and regular rhythm.     Heart sounds: No murmur heard. Pulmonary:     Effort: No respiratory distress.     Breath sounds: Normal breath sounds.  Musculoskeletal:     Cervical back: Normal range of motion and neck supple.  Neurological:     Mental Status: He is alert and oriented  to person, place, and time.      Assessment & Plan:   Frank Rosales was seen today for diabetes.  Diagnoses and all orders for this visit:  Diabetes mellitus, new onset ([place]) -     Bayer DCA Hb A1c Waived -     CBC with Differential/Platelet -     CMP14+EGFR  Essential hypertension -     CMP14+EGFR  Hypothyroidism, unspecified type -     TSH + free T4  Mixed hyperlipidemia -     Lipid panel  High cholesterol -     atorvastatin (LIPITOR) 40 MG tablet; TAKE 1 TABLET BY MOUTH  DAILY FOR CHOLESTEROL      I am having Frank Rosales maintain his aspirin, cholecalciferol, vitamin G-67, folic acid, acetaminophen, albuterol, triamcinolone cream, Famotidine (PEPCID PO), vitamin C, albuterol, levothyroxine, blood glucose meter kit and supplies, [employer], metFORMIN, NIFEdipine, dapagliflozin propanediol, Dupixent, olmesartan, OneTouch Verio, OneTouch Delica Lancets 70H, and atorvastatin.  Allergies as of 11/29/2020       Reactions   No Known Allergies         Medication List        Accurate as of November 29, 2020  8:04 PM. If you have any questions, ask your nurse or doctor.          acetaminophen 325 MG tablet Commonly known as: TYLENOL Take 2 tablets (650 mg total) by mouth every 6 (six) hours as needed.   albuterol (2.5 MG/3ML) 0.083% nebulizer solution Commonly known as: PROVENTIL Take 3 mLs (2.5 mg total) by nebulization every 6 (six) hours as needed for wheezing or shortness of breath.   albuterol 108 (90 Base) MCG/ACT inhaler Commonly known as: ProAir HFA Inhale 2 puffs into the lungs every 6 (six) hours.   aspirin 81 MG tablet Take 81 mg by mouth daily.   atorvastatin 40 MG tablet Commonly known as: LIPITOR TAKE 1 TABLET BY MOUTH  DAILY FOR CHOLESTEROL   blood glucose meter kit and supplies Kit Dispense based on patient and insurance preference. Use up to four times daily as directed. (FOR ICD-10 : E11.9   Breztri Aerosphere 160-9-4.8 MCG/ACT  Aero Generic drug: Budeson-Glycopyrrol-Formoterol Inhale 2 puffs into the lungs 2 (two) times daily.   cholecalciferol 1000 units tablet Commonly known as: VITAMIN D Take 1,000 Units by mouth daily.   dapagliflozin propanediol 10 MG Tabs tablet Commonly known as: Farxiga Take 1 tablet (10 mg total) by mouth daily before breakfast.   Dupixent 300 MG/2ML prefilled syringe Generic drug: dupilumab Inject 300 mg into the skin every 14 (fourteen) days. Starting at day 15 for maintenance.   folic acid 1 MG tablet Commonly known as: FOLVITE Take 1 tablet (1 mg total) by mouth daily.   levothyroxine 50 MCG tablet Commonly known as: SYNTHROID 1 TABLET EVERY MORNING BEFORE BREAKFAST-WAIT 1 HR AFTER TAKING BEFORE EATING OR DRINKING   metFORMIN 750 MG 24 hr tablet Commonly known as: GLUCOPHAGE-XR Take 2 tablets (1,500 mg  total) by mouth daily with breakfast.   NIFEdipine 60 MG 24 hr tablet Commonly known as: ADALAT CC Take 1 tablet (60 mg total) by mouth 2 (two) times daily.   olmesartan 40 MG tablet Commonly known as: BENICAR TAKE 1 TABLET BY MOUTH  DAILY FOR BLOOD PRESSURE   OneTouch Delica Lancets 47W Misc Apply topically.   OneTouch Verio test strip Generic drug: glucose blood SMARTSIG:Via Meter 1-4 Times Daily   PEPCID PO Take 1 tablet by mouth daily.   triamcinolone cream 0.1 % Commonly known as: KENALOG   vitamin B-12 250 MCG tablet Commonly known as: CYANOCOBALAMIN Take 250 mcg by mouth daily.   vitamin C 100 MG tablet Take 100 mg by mouth daily.         Follow-up: Return in about 1 month (around 12/30/2020).  Claretta Fraise, M.D.

## 2020-11-30 LAB — CBC WITH DIFFERENTIAL/PLATELET
Basophils Absolute: 0.1 10*3/uL (ref 0.0–0.2)
Basos: 1 %
EOS (ABSOLUTE): 0.1 10*3/uL (ref 0.0–0.4)
Eos: 1 %
Hematocrit: 49.1 % (ref 37.5–51.0)
Hemoglobin: 17.2 g/dL (ref 13.0–17.7)
Immature Grans (Abs): 0 10*3/uL (ref 0.0–0.1)
Immature Granulocytes: 0 %
Lymphocytes Absolute: 1.3 10*3/uL (ref 0.7–3.1)
Lymphs: 14 %
MCH: 34.7 pg — ABNORMAL HIGH (ref 26.6–33.0)
MCHC: 35 g/dL (ref 31.5–35.7)
MCV: 99 fL — ABNORMAL HIGH (ref 79–97)
Monocytes Absolute: 0.9 10*3/uL (ref 0.1–0.9)
Monocytes: 9 %
Neutrophils Absolute: 7 10*3/uL (ref 1.4–7.0)
Neutrophils: 75 %
Platelets: 242 10*3/uL (ref 150–450)
RBC: 4.96 x10E6/uL (ref 4.14–5.80)
RDW: 12.5 % (ref 11.6–15.4)
WBC: 9.4 10*3/uL (ref 3.4–10.8)

## 2020-11-30 LAB — LIPID PANEL
Chol/HDL Ratio: 2.2 ratio (ref 0.0–5.0)
Cholesterol, Total: 145 mg/dL (ref 100–199)
HDL: 67 mg/dL (ref >39)
LDL Chol Calc (NIH): 61 mg/dL (ref 0–99)
Triglycerides: 89 mg/dL (ref 0–149)
VLDL Cholesterol Cal: 17 mg/dL (ref 5–40)

## 2020-11-30 LAB — CMP14+EGFR
ALT: 40 IU/L (ref 0–44)
AST: 41 IU/L — ABNORMAL HIGH (ref 0–40)
Albumin/Globulin Ratio: 2 (ref 1.2–2.2)
Albumin: 4.9 g/dL — ABNORMAL HIGH (ref 3.8–4.8)
Alkaline Phosphatase: 91 IU/L (ref 44–121)
BUN/Creatinine Ratio: 9 — ABNORMAL LOW (ref 10–24)
BUN: 9 mg/dL (ref 8–27)
Bilirubin Total: 0.8 mg/dL (ref 0.0–1.2)
CO2: 21 mmol/L (ref 20–29)
Calcium: 9.6 mg/dL (ref 8.6–10.2)
Chloride: 98 mmol/L (ref 96–106)
Creatinine, Ser: 1.03 mg/dL (ref 0.76–1.27)
Globulin, Total: 2.5 g/dL (ref 1.5–4.5)
Glucose: 127 mg/dL — ABNORMAL HIGH (ref 65–99)
Potassium: 4.8 mmol/L (ref 3.5–5.2)
Sodium: 137 mmol/L (ref 134–144)
Total Protein: 7.4 g/dL (ref 6.0–8.5)
eGFR: 82 mL/min/{1.73_m2} (ref >59)

## 2020-11-30 LAB — TSH+FREE T4
Free T4: 1.48 ng/dL (ref 0.82–1.77)
TSH: 4.01 u[IU]/mL (ref 0.450–4.500)

## 2020-11-30 NOTE — Progress Notes (Signed)
Hello Jeramih,  Your lab result is normal and/or stable.Some minor variations that are not significant are commonly marked abnormal, but do not represent any medical problem for you.  Best regards, Claretta Fraise, M.D.

## 2020-12-13 ENCOUNTER — Encounter: Payer: Self-pay | Admitting: Dermatology

## 2020-12-13 ENCOUNTER — Ambulatory Visit (INDEPENDENT_AMBULATORY_CARE_PROVIDER_SITE_OTHER): Payer: Medicare Other | Admitting: Pharmacist

## 2020-12-13 DIAGNOSIS — J449 Chronic obstructive pulmonary disease, unspecified: Secondary | ICD-10-CM

## 2020-12-13 DIAGNOSIS — E119 Type 2 diabetes mellitus without complications: Secondary | ICD-10-CM | POA: Diagnosis not present

## 2020-12-13 NOTE — Progress Notes (Signed)
Chronic Care Management Pharmacy Note  12/13/2020 Name:  Frank Rosales MRN:  [id number] DOB:  Dec 18, 1955  Summary: DIABETES MANAGEMENT; PATIENT ASSISTANCE  Recommendations/Changes made from today's visit: Diabetes: Ccontrolled; current treatment:FARXIGA 10MG, METFORMIN;  A1C 5.9%  MAY BE ABLE TO D/C METFORMIN IN FUTURE GFR 82 CAN'T AFFORD FARXIGA--APPLICATION SUBMITTED FOR AZ&ME PATIENT ASSISTANCE Current glucose readings: fasting glucose: 110-125, post prandial glucose: 135-165 Denies hypoglycemic/hyperglycemic symptoms Discussed meal planning options and Plate method for healthy eating Avoid sugary drinks and desserts Incorporate balanced protein, non starchy veggies, 1 serving of carbohydrate with each meal Increase water intake Increase physical activity as able Current exercise: N/A Educated on [place], [address] patient finances. APPLICATION SUBMITTED FOR AZ&ME PATIENT ASSISTANCE FOR FARXIGA  Chronic Obstructive Pulmonary Disease: Uncontrolled; current treatment:BREZTRI; albuterol rescue, dupixent GOLD Classification: III (sees Dr. Melvyn Novas for pulm) Most recent Pulmonary Function Testing: TLC  Date Value Ref Range Status  12/21/2016 6.47 L Final   0 exacerbations requiring treatment in the last 6 months  Assessed patient finances. APPLICATION SUBMITTED FOR AZ&ME PATIENT ASSISTANCE FOR BREZTRI   Follow Up Plan: Telephone follow up appointment with care management team member scheduled for: 1 MONTH   Plan:  Subjective: Frank Rosales is an 65 y.o. year old male who is a primary patient of Stacks, Cletus Gash, MD.  The CCM team was consulted for assistance with disease management and care coordination needs.    Engaged with patient by telephone for initial visit in response to provider referral for pharmacy case management and/or care coordination services.   Consent to Services:  The patient was given information about Chronic Care Management  services, agreed to services, and gave verbal consent prior to initiation of services.  Please see initial visit note for detailed documentation.   Patient Care Team: Claretta Fraise, MD as PCP - General (Family Medicine) Tanda Rockers, MD as Consulting Physician (Pulmonary Disease) Lavonna Monarch, MD as Consulting Physician (Dermatology) [facility] Lavera Guise, [facility] as Pharmacist (Family Medicine)  Objective:  Lab Results  Component Value Date   CREATININE 1.03 11/29/2020   CREATININE 0.93 08/01/2020   CREATININE 0.97 02/02/2020    Lab Results  Component Value Date   HGBA1C 5.9 11/29/2020   Last diabetic Eye exam: No results found for: HMDIABEYEEXA  Last diabetic Foot exam: No results found for: HMDIABFOOTEX      Component Value Date/Time   CHOL 145 11/29/2020 1121   TRIG 89 11/29/2020 1121   TRIG 99 12/03/2013 0858   HDL 67 11/29/2020 1121   HDL 75 12/03/2013 0858   CHOLHDL 2.2 11/29/2020 1121   LDLCALC 61 11/29/2020 1121   LDLCALC 125 (H) 12/03/2013 0858    Hepatic Function Latest Ref Rng & Units 11/29/2020 08/01/2020 02/02/2020  Total Protein 6.0 - 8.5 g/dL 7.4 7.6 7.5  Albumin 3.8 - 4.8 g/dL 4.9(H) 4.5 4.7  AST 0 - 40 IU/L 41(H) 47(H) 37  ALT 0 - 44 IU/L 40 48(H) 48(H)  Alk Phosphatase 44 - 121 IU/L 91 114 126(H)  Total Bilirubin 0.0 - 1.2 mg/dL 0.8 0.8 0.7    Lab Results  Component Value Date/Time   TSH 4.010 11/29/2020 11:21 AM   TSH 3.200 08/01/2020 11:38 AM   FREET4 1.48 11/29/2020 11:21 AM   FREET4 1.40 08/01/2020 11:38 AM    CBC Latest Ref Rng & Units 11/29/2020 08/01/2020 02/02/2020  WBC 3.4 - 10.8 x10E3/uL 9.4 11.9(H) 10.9(H)  Hemoglobin 13.0 - 17.7 g/dL 17.2 16.5  17.0  Hematocrit 37.5 - 51.0 % 49.1 48.3 48.3  Platelets 150 - 450 x10E3/uL 242 258 289    Lab Results  Component Value Date/Time   VD25OH 38.3 12/27/2017 10:10 AM   VD25OH 37.0 02/03/2013 08:04 AM    Clinical ASCVD: No  The 10-year ASCVD risk score Mikey Bussing DC  Jr., et al., 2013) is: 19.7%   Values used to calculate the score:     Age: 43 years     Sex: Male     Is Non-Hispanic African American: No     Diabetic: Yes     Tobacco smoker: No     Systolic Blood Pressure: 465 mmHg     Is BP treated: Yes     HDL Cholesterol: 67 mg/dL     Total Cholesterol: 145 mg/dL    Other: (CHADS2VASc if Afib, PHQ9 if depression, MMRC or CAT for COPD, ACT, DEXA)  Social History   Tobacco Use  Smoking Status Former   Packs/day: 1.50   Years: 45.00   Pack years: 67.50   Types: Cigarettes   Quit date: 11/11/2017   Years since quitting: 3.0  Smokeless Tobacco Never  Tobacco Comments        BP Readings from Last 3 Encounters:  11/29/20 (!) 158/88  10/25/20 140/75  10/03/20 (!) 152/83   Pulse Readings from Last 3 Encounters:  11/29/20 (!) 108  10/25/20 (!) 107  10/03/20 (!) 107   Wt Readings from Last 3 Encounters:  11/29/20 212 lb (96.2 kg)  11/28/20 220 lb (99.8 kg)  10/25/20 221 lb 9.6 oz (100.5 kg)    Assessment: Review of patient past medical history, allergies, medications, health status, including review of consultants reports, laboratory and other test data, was performed as part of comprehensive evaluation and provision of chronic care management services.   SDOH:  (Social Determinants of Health) assessments and interventions performed:    CCM Care Plan  Allergies  Allergen Reactions   No Known Allergies     Medications Reviewed Today     Reviewed by Lavera Guise, [facility] (Pharmacist) on 12/16/20 at 73  Med List Status: <None>   Medication Order Taking? Sig Documenting Provider Last Dose Status Informant  acetaminophen (TYLENOL) 325 MG tablet [id number] No Take 2 tablets (650 mg total) by mouth every 6 (six) hours as needed. Jill Alexanders, PA-C Taking Active Self  albuterol (PROAIR HFA) 108 (90 Base) MCG/ACT inhaler [id number] No Inhale 2 puffs into the lungs every 6 (six) hours. Claretta Fraise, MD Taking Active   albuterol  (PROVENTIL) (2.5 MG/3ML) 0.083% nebulizer solution [id number] No Take 3 mLs (2.5 mg total) by nebulization every 6 (six) hours as needed for wheezing or shortness of breath. Terald Sleeper, PA-C Taking Active   Ascorbic Acid (VITAMIN C) 100 MG tablet [id number] No Take 100 mg by mouth daily. [provider] Taking Active   aspirin 81 MG tablet [id number] No Take 81 mg by mouth daily.  [provider] Taking Active Self  atorvastatin (LIPITOR) 40 MG tablet [id number]  TAKE 1 TABLET BY MOUTH  DAILY FOR CHOLESTEROL Stacks, Cletus Gash, MD  Active   blood glucose meter kit and supplies KIT [id number] No Dispense based on patient and insurance preference. Use up to four times daily as directed. (FOR ICD-10 : E11.9 Claretta Fraise, MD Taking Active   Budeson-Glycopyrrol-Formoterol (BREZTRI AEROSPHERE) 160-9-4.8 MCG/ACT AERO [id number] No Inhale 2 puffs into the lungs 2 (two) times daily. Claretta Fraise, MD Taking Active   cholecalciferol (  VITAMIN D) 1000 UNITS tablet [id number] No Take 1,000 Units by mouth daily.  [provider] Taking Active Self  dapagliflozin propanediol (FARXIGA) 10 MG TABS tablet [id number] No Take 1 tablet (10 mg total) by mouth daily before breakfast. Claretta Fraise, MD Taking Active   dupilumab (DUPIXENT) 300 MG/2ML prefilled syringe [id number] No Inject 300 mg into the skin every 14 (fourteen) days. Starting at day 15 for maintenance. Lavonna Monarch, MD Taking Active   Famotidine (PEPCID PO) [id number] No Take 1 tablet by mouth daily. [provider] Taking Active   folic acid (FOLVITE) 1 MG tablet [id number] No Take 1 tablet (1 mg total) by mouth daily. Lysbeth Penner, FNP Taking Active Self  levothyroxine (SYNTHROID) 50 MCG tablet [id number] No 1 TABLET EVERY MORNING BEFORE BREAKFAST-WAIT 1 HR AFTER TAKING BEFORE EATING OR DRINKING Stacks, Cletus Gash, MD Taking Active   metFORMIN (GLUCOPHAGE-XR) 750 MG 24 hr tablet [id number] No Take 2 tablets (1,500 mg total) by mouth  daily with breakfast. Claretta Fraise, MD Taking Active   NIFEdipine (ADALAT CC) 60 MG 24 hr tablet [id number] No Take 1 tablet (60 mg total) by mouth 2 (two) times daily. Claretta Fraise, MD Taking Active   olmesartan [facility]) 40 MG tablet [id number] No TAKE 1 TABLET BY MOUTH  DAILY FOR BLOOD PRESSURE Claretta Fraise, MD Taking Active   OneTouch Delica Lancets 35D MISC [id number] No Apply topically. [provider] Taking Active   [facility] VERIO test strip [id number] No SMARTSIG:Via Meter 1-4 Times Daily [provider] Taking Active   triamcinolone cream (KENALOG) 0.1 % [id number] No  [provider] Taking Active   vitamin B-12 (CYANOCOBALAMIN) 250 MCG tablet [id number] No Take 250 mcg by mouth daily. [provider] Taking Active Self            Patient Active Problem List   Diagnosis Date Noted   Diabetes mellitus, new onset ([place]) 09/04/2020   Mixed hyperlipidemia 02/02/2020   Eczema 03/31/2019   Polycythemia, secondary 06/04/2018   Macrocytosis 12/27/2017   Upper airway cough syndrome 12/24/2017   Neuropathy 10/23/2016   Hemothorax on left 08/21/2016   Rib fractures 08/05/2016   OSA (obstructive sleep apnea) 05/02/2016   Asthma 04/13/2016   Asbestosis ([place]) 02/16/2016   Sleep disturbance 07/06/2015   Alcohol abuse 10/19/2014   GAD (generalized anxiety disorder) 07/01/2014   Hypothyroidism 12/03/2013   Osteopenia 03/11/2013   History of colonic polyps 07/22/2012   Cigarette smoker 07/12/2011   Essential hypertension    COPD GOLD III     High cholesterol    Hilar adenopathy     Immunization History  Administered Date(s) Administered   Influenza Split 02/12/2012, 02/11/2013   Influenza Whole 03/12/2011   Influenza,inj,Quad PF,6+ Mos 02/21/2015, 03/07/2016, 03/06/2017, 02/20/2018, 03/10/2019, 02/02/2020   Moderna SARS-COV2 Booster Vaccination 04/19/2020, 10/18/2020   Moderna Sars-Covid-2 Vaccination 08/05/2019, 08/31/2019   Pneumococcal  Conjugate-13 02/21/2015   Pneumococcal Polysaccharide-23 04/14/2013   Td 05/14/2002   Tdap 02/06/2013    Conditions to be addressed/monitored: COPD and DMII  Care Plan : PHARMD MEDICATION MANAGEMENT  Updates made by Lavera Guise, [place] since 12/16/2020 12:00 AM     Problem: DISEASE PROGRESSION PREVENTION      Long-Range Goal: T2DM,COPD   This Visit's Progress: On track  Priority: High  Note:   Current Barriers:  Unable to independently afford treatment regimen  Pharmacist Clinical Goal(s):  Over the next 90 days, patient will verbalize ability to afford treatment regimen maintain control of T2DM as evidenced  by CONTINUED GLYCEMIC CONTROL  through collaboration with PharmD and provider.    Interventions: 1:1 collaboration with Claretta Fraise, MD regarding development and update of comprehensive plan of care as evidenced by provider attestation and co-signature Inter-disciplinary care team collaboration (see longitudinal plan of care) Comprehensive medication review performed; medication list updated in electronic medical record  Diabetes: Ccontrolled; current treatment:FARXIGA 10MG, METFORMIN;  A1C 5.9%  MAY BE ABLE TO D/C METFORMIN IN FUTURE GFR 82 CAN'T AFFORD FARXIGA--APPLICATION SUBMITTED FOR AZ&ME PATIENT ASSISTANCE Current glucose readings: fasting glucose: 110-125, post prandial glucose: 135-165 Denies hypoglycemic/hyperglycemic symptoms Discussed meal planning options and Plate method for healthy eating Avoid sugary drinks and desserts Incorporate balanced protein, non starchy veggies, 1 serving of carbohydrate with each meal Increase water intake Increase physical activity as able Current exercise: N/A Educated on FARXIGA, [place] patient finances. APPLICATION SUBMITTED FOR AZ&ME PATIENT ASSISTANCE FOR FARXIGA  Chronic Obstructive Pulmonary Disease: Uncontrolled/controlled; current treatment:BREZTRI; albuterol rescue, dupixent GOLD  Classification: III (sees Dr. Melvyn Novas for pulm) Most recent Pulmonary Function Testing: TLC  Date Value Ref Range Status  12/21/2016 6.47 L Final  0 exacerbations requiring treatment in the last 6 months  Assessed patient finances. APPLICATION SUBMITTED FOR AZ&ME PATIENT ASSISTANCE FOR BREZTRI   Patient Goals/Self-Care Activities Over the next 90 days, patient will:  - take medications as prescribed  Follow Up Plan: Telephone follow up appointment with care management team member scheduled for: 1 MONTH      Medication Assistance: Application for [place]  medication assistance program. in process.  Anticipated assistance start date TBD.  See plan of care for additional detail.  Patient's preferred pharmacy is:  [id number] [place], [address]: [phone]: [phone] [address] [place]Phone: [phone]: [phone] [address] [place] [address]Phone: [phone]: [phone] [facility]  [place] [address]Phone: [phone]: [phone]  Follow Up:  Patient agrees to Care Plan and Follow-up.  Plan: Telephone follow up appointment with care management team member scheduled for:  1 MONTH  JRegina Eck PharmD, BCPS Clinical Pharmacist, [place] Phone [phone]

## 2020-12-13 NOTE — Progress Notes (Signed)
   Follow-Up Visit   Subjective  Frank Rosales is a 65 y.o. male who presents for the following: Procedure (Patient here today for treatment of SCC x 3 left nasal sidewall, right temple, left temple and BCC x 1 left temporal scalp).  Multiple biopsy-proven nonmelanoma skin cancers plus new crust on left sideburn Location:  Duration:  Quality:  Associated Signs/Symptoms: Modifying Factors:  Severity:  Timing: Context:   Objective  Well appearing patient in no apparent distress; mood and affect are within normal limits. Left Temporal Scalp Superior Waxy pink-brown 8 mm crust     Left Temple Biopsy site identified by nurse and me    A focused examination was performed including head and neck.. Relevant physical exam findings are noted in the Assessment and Plan.   Assessment & Plan    Neoplasm of uncertain behavior of skin Left Temporal Scalp Superior  Skin / nail biopsy Type of biopsy: tangential   Informed consent: discussed and consent obtained   Timeout: patient name, date of birth, surgical site, and procedure verified   Procedure prep:  Patient was prepped and draped in usual sterile fashion (Non sterile) Prep type:  Chlorhexidine Anesthesia: the lesion was anesthetized in a standard fashion   Anesthetic:  1% lidocaine w/ epinephrine 1-100,000 local infiltration Instrument used: flexible razor blade   Outcome: patient tolerated procedure well   Post-procedure details: wound care instructions given    Destruction of lesion Complexity: simple   Destruction method: electrodesiccation and curettage   Informed consent: discussed and consent obtained   Timeout:  patient name, date of birth, surgical site, and procedure verified Anesthesia: the lesion was anesthetized in a standard fashion   Anesthetic:  1% lidocaine w/ epinephrine 1-100,000 local infiltration Curettage performed in three different directions: Yes     Electrodesiccation performed over the  curetted area: No   Curettage cycles:  3 Final wound size (cm):  1.8 Hemostasis achieved with:  ferric subsulfate Outcome: patient tolerated procedure well with no complications   Post-procedure details: wound care instructions given   Additional details:  Wound innoculated with 5 fluorouracil solution.  Specimen 1 - Surgical pathology Differential Diagnosis: R/O BCC vs SCC  Check Margins: No  Treated after biopsy  Squamous cell carcinoma of skin Left Temple  Destruction of lesion Complexity: simple   Destruction method: electrodesiccation and curettage   Informed consent: discussed and consent obtained   Timeout:  patient name, date of birth, surgical site, and procedure verified Anesthesia: the lesion was anesthetized in a standard fashion   Anesthetic:  1% lidocaine w/ epinephrine 1-100,000 local infiltration Curettage performed in three different directions: Yes     Electrodesiccation performed over the curetted area: No   Curettage cycles:  3 Lesion length (cm):  1.3 Lesion width (cm):  1.3 Margin per side (cm):  0 Final wound size (cm):  1.3 Hemostasis achieved with:  ferric subsulfate Outcome: patient tolerated procedure well with no complications   Post-procedure details: sterile dressing applied and wound care instructions given   Dressing type: petrolatum   Additional details:  Wound innoculated with 5 fluorouracil solution.      I, Lavonna Monarch, MD, have reviewed all documentation for this visit.  The documentation on 12/13/20 for the exam, diagnosis, procedures, and orders are all accurate and complete.

## 2020-12-16 NOTE — Patient Instructions (Signed)
Visit Information  PATIENT GOALS:  Goals Addressed               This Visit's Progress     Patient Stated     T2DM, COPD (pt-stated)        Current Barriers:  Unable to independently afford treatment regimen  Pharmacist Clinical Goal(s):  Over the next 90 days, patient will verbalize ability to afford treatment regimen maintain control of T2DM as evidenced by [place]  through collaboration with PharmD and provider.    Interventions: 1:1 collaboration with Claretta Fraise, MD regarding development and update of comprehensive plan of care as evidenced by provider attestation and co-signature Inter-disciplinary care team collaboration (see longitudinal plan of care) Comprehensive medication review performed; medication list updated in electronic medical record  Diabetes: Ccontrolled; current treatment:FARXIGA '10MG'$ , METFORMIN;  A1C 5.9%  MAY BE ABLE TO D/C METFORMIN IN FUTURE GFR 82 CAN'T AFFORD FARXIGA--APPLICATION SUBMITTED FOR AZ&ME PATIENT ASSISTANCE Current glucose readings: fasting glucose: 110-125, post prandial glucose: 135-165 Denies hypoglycemic/hyperglycemic symptoms Discussed meal planning options and Plate method for healthy eating Avoid sugary drinks and desserts Incorporate balanced protein, non starchy veggies, 1 serving of carbohydrate with each meal Increase water intake Increase physical activity as able Current exercise: N/A Educated on FARXIGA, [place] patient finances. APPLICATION SUBMITTED FOR AZ&ME PATIENT ASSISTANCE FOR FARXIGA  Chronic Obstructive Pulmonary Disease: Uncontrolled/controlled; current treatment:BREZTRI; albuterol rescue, dupixent GOLD Classification: III (sees Dr. Melvyn Novas for pulm) Most recent Pulmonary Function Testing: TLC  Date Value Ref Range Status  12/21/2016 6.47 L Final  0 exacerbations requiring treatment in the last 6 months  Assessed patient finances. APPLICATION SUBMITTED FOR AZ&ME  PATIENT ASSISTANCE FOR BREZTRI   Patient Goals/Self-Care Activities Over the next 90 days, patient will:  - take medications as prescribed  Follow Up Plan: Telephone follow up appointment with care management team member scheduled for: 1 MONTH         The patient verbalized understanding of instructions, educational materials, and care plan provided today and declined offer to receive copy of patient instructions, educational materials, and care plan.   Telephone follow up appointment with care management team member scheduled for: 1 MONTH  Signature Frank Rosales, PharmD, BCPS Clinical Pharmacist, [place]  II Phone [phone]

## 2020-12-22 NOTE — Progress Notes (Signed)
Received notification from AZ&ME regarding approval for FARXIGA '10MG'$  & BREZTRI 160MCG. Patient assistance approved from 12/16/20 to 05/13/21.  Medication will ship to pt's home.  Phone: [phone]

## 2020-12-27 ENCOUNTER — Ambulatory Visit: Payer: Medicare Other | Admitting: Pharmacist

## 2020-12-27 DIAGNOSIS — J449 Chronic obstructive pulmonary disease, unspecified: Secondary | ICD-10-CM

## 2020-12-27 DIAGNOSIS — E119 Type 2 diabetes mellitus without complications: Secondary | ICD-10-CM

## 2020-12-28 NOTE — Progress Notes (Signed)
Chronic Care Management Pharmacy Note  12/27/2020 Name:  Frank Rosales MRN:  [id number] DOB:  11/02/1955  Summary: DM/COPD  Recommendations/Changes made from today's visit: Diabetes: Ccontrolled; current treatment:FARXIGA 10MG, METFORMIN;  A1C 5.9%  MAY BE ABLE TO D/C METFORMIN IN FUTURE GFR 82 CAN'T AFFORD FARXIGA--approved for farxiga via AZ&ME PATIENT ASSISTANCE Current glucose readings: fasting glucose: 110-125, post prandial glucose: 135-165 Educated on [place], [place] patient finances. Patient approved for farxiga & breztri via AZ&ME PATIENT ASSISTANCE--medication to ship to patient's home in 7-10 business days   Chronic Obstructive Pulmonary Disease: Uncontrolled/controlled; current treatment:BREZTRI; albuterol rescue, dupixent GOLD Classification: III (sees Dr. Melvyn Novas for pulm) Most recent Pulmonary Function Testing: TLC  Date Value Ref Range Status  12/21/2016 6.47 L Final   0 exacerbations requiring treatment in the last 6 months  Assessed patient finances. Patient approved for farxiga & breztri  AZ&ME PATIENT ASSISTANCE FOR BREZTRI  Follow Up Plan: Telephone follow up appointment with care management team member scheduled for: 1 MONTH  Subjective: Frank Rosales is an 65 y.o. year old male who is a primary patient of Stacks, Cletus Gash, MD.  The CCM team was consulted for assistance with disease management and care coordination needs.    Engaged with patient by telephone for initial visit in response to provider referral for pharmacy case management and/or care coordination services.   Consent to Services:  The patient was given information about Chronic Care Management services, agreed to services, and gave verbal consent prior to initiation of services.  Please see initial visit note for detailed documentation.   Patient Care Team: Claretta Fraise, MD as PCP - General (Family Medicine) Tanda Rockers, MD as Consulting Physician (Pulmonary  Disease) Lavonna Monarch, MD as Consulting Physician (Dermatology) [facility] Lavera Guise, [facility] as Pharmacist (Family Medicine)   Objective:  Lab Results  Component Value Date   CREATININE 1.03 11/29/2020   CREATININE 0.93 08/01/2020   CREATININE 0.97 02/02/2020    Lab Results  Component Value Date   HGBA1C 5.9 11/29/2020   Last diabetic Eye exam: No results found for: HMDIABEYEEXA  Last diabetic Foot exam: No results found for: HMDIABFOOTEX      Component Value Date/Time   CHOL 145 11/29/2020 1121   TRIG 89 11/29/2020 1121   TRIG 99 12/03/2013 0858   HDL 67 11/29/2020 1121   HDL 75 12/03/2013 0858   CHOLHDL 2.2 11/29/2020 1121   LDLCALC 61 11/29/2020 1121   LDLCALC 125 (H) 12/03/2013 0858    Hepatic Function Latest Ref Rng & Units 11/29/2020 08/01/2020 02/02/2020  Total Protein 6.0 - 8.5 g/dL 7.4 7.6 7.5  Albumin 3.8 - 4.8 g/dL 4.9(H) 4.5 4.7  AST 0 - 40 IU/L 41(H) 47(H) 37  ALT 0 - 44 IU/L 40 48(H) 48(H)  Alk Phosphatase 44 - 121 IU/L 91 114 126(H)  Total Bilirubin 0.0 - 1.2 mg/dL 0.8 0.8 0.7    Lab Results  Component Value Date/Time   TSH 4.010 11/29/2020 11:21 AM   TSH 3.200 08/01/2020 11:38 AM   FREET4 1.48 11/29/2020 11:21 AM   FREET4 1.40 08/01/2020 11:38 AM    CBC Latest Ref Rng & Units 11/29/2020 08/01/2020 02/02/2020  WBC 3.4 - 10.8 x10E3/uL 9.4 11.9(H) 10.9(H)  Hemoglobin 13.0 - 17.7 g/dL 17.2 16.5 17.0  Hematocrit 37.5 - 51.0 % 49.1 48.3 48.3  Platelets 150 - 450 x10E3/uL 242 258 289    Lab Results  Component Value Date/Time   VD25OH  38.3 12/27/2017 10:10 AM   VD25OH 37.0 02/03/2013 08:04 AM    Clinical ASCVD: No  The 10-year ASCVD risk score Mikey Bussing DC Jr., et al., 2013) is: 21.6%   Values used to calculate the score:     Age: 65 years     Sex: Male     Is Non-Hispanic African American: No     Diabetic: Yes     Tobacco smoker: No     Systolic Blood Pressure: 253 mmHg     Is BP treated: Yes     HDL Cholesterol: 67  mg/dL     Total Cholesterol: 145 mg/dL    Other: (CHADS2VASc if Afib, PHQ9 if depression, MMRC or CAT for COPD, ACT, DEXA)  Social History   Tobacco Use  Smoking Status Former   Packs/day: 1.50   Years: 45.00   Pack years: 67.50   Types: Cigarettes   Quit date: 11/11/2017   Years since quitting: 3.1  Smokeless Tobacco Never  Tobacco Comments        BP Readings from Last 3 Encounters:  11/29/20 (!) 158/88  10/25/20 140/75  10/03/20 (!) 152/83   Pulse Readings from Last 3 Encounters:  11/29/20 (!) 108  10/25/20 (!) 107  10/03/20 (!) 107   Wt Readings from Last 3 Encounters:  11/29/20 212 lb (96.2 kg)  11/28/20 220 lb (99.8 kg)  10/25/20 221 lb 9.6 oz (100.5 kg)    Assessment: Review of patient past medical history, allergies, medications, health status, including review of consultants reports, laboratory and other test data, was performed as part of comprehensive evaluation and provision of chronic care management services.   SDOH:  (Social Determinants of Health) assessments and interventions performed:    CCM Care Plan  Allergies  Allergen Reactions   No Known Allergies     Medications Reviewed Today     Reviewed by Lavera Guise, [facility] (Pharmacist) on 12/16/20 at 37  Med List Status: <None>   Medication Order Taking? Sig Documenting Provider Last Dose Status Informant  acetaminophen (TYLENOL) 325 MG tablet [id number] No Take 2 tablets (650 mg total) by mouth every 6 (six) hours as needed. Jill Alexanders, PA-C Taking Active Self  albuterol (PROAIR HFA) 108 (90 Base) MCG/ACT inhaler [id number] No Inhale 2 puffs into the lungs every 6 (six) hours. Claretta Fraise, MD Taking Active   albuterol (PROVENTIL) (2.5 MG/3ML) 0.083% nebulizer solution [id number] No Take 3 mLs (2.5 mg total) by nebulization every 6 (six) hours as needed for wheezing or shortness of breath. Terald Sleeper, PA-C Taking Active   Ascorbic Acid (VITAMIN C) 100 MG tablet [id number] No Take 100 mg  by mouth daily. [provider] Taking Active   aspirin 81 MG tablet [id number] No Take 81 mg by mouth daily.  [provider] Taking Active Self  atorvastatin (LIPITOR) 40 MG tablet [id number]  TAKE 1 TABLET BY MOUTH  DAILY FOR CHOLESTEROL Stacks, Cletus Gash, MD  Active   blood glucose meter kit and supplies KIT [id number] No Dispense based on patient and insurance preference. Use up to four times daily as directed. (FOR ICD-10 : E11.9 Claretta Fraise, MD Taking Active   Budeson-Glycopyrrol-Formoterol (BREZTRI AEROSPHERE) 160-9-4.8 MCG/ACT AERO [id number] No Inhale 2 puffs into the lungs 2 (two) times daily. Claretta Fraise, MD Taking Active   cholecalciferol (VITAMIN D) 1000 UNITS tablet [id number] No Take 1,000 Units by mouth daily.  [provider] Taking Active Self  dapagliflozin propanediol (FARXIGA) 10 MG TABS tablet [id number] No Take  1 tablet (10 mg total) by mouth daily before breakfast. Claretta Fraise, MD Taking Active   dupilumab (DUPIXENT) 300 MG/2ML prefilled syringe [id number] No Inject 300 mg into the skin every 14 (fourteen) days. Starting at day 15 for maintenance. Lavonna Monarch, MD Taking Active   Famotidine (PEPCID PO) [id number] No Take 1 tablet by mouth daily. [provider] Taking Active   folic acid (FOLVITE) 1 MG tablet [id number] No Take 1 tablet (1 mg total) by mouth daily. Lysbeth Penner, FNP Taking Active Self  levothyroxine (SYNTHROID) 50 MCG tablet [id number] No 1 TABLET EVERY MORNING BEFORE BREAKFAST-WAIT 1 HR AFTER TAKING BEFORE EATING OR DRINKING Stacks, Cletus Gash, MD Taking Active   metFORMIN (GLUCOPHAGE-XR) 750 MG 24 hr tablet [id number] No Take 2 tablets (1,500 mg total) by mouth daily with breakfast. Claretta Fraise, MD Taking Active   NIFEdipine (ADALAT CC) 60 MG 24 hr tablet [id number] No Take 1 tablet (60 mg total) by mouth 2 (two) times daily. Claretta Fraise, MD Taking Active   olmesartan [facility]) 40 MG tablet [id number] No TAKE 1 TABLET BY  MOUTH  DAILY FOR BLOOD PRESSURE Claretta Fraise, MD Taking Active   OneTouch Delica Lancets 53I MISC [id number] No Apply topically. [provider] Taking Active   [facility] VERIO test strip [id number] No SMARTSIG:Via Meter 1-4 Times Daily [provider] Taking Active   triamcinolone cream (KENALOG) 0.1 % [id number] No  [provider] Taking Active   vitamin B-12 (CYANOCOBALAMIN) 250 MCG tablet [id number] No Take 250 mcg by mouth daily. [provider] Taking Active Self            Patient Active Problem List   Diagnosis Date Noted   Diabetes mellitus, new onset ([place]) 09/04/2020   Mixed hyperlipidemia 02/02/2020   Eczema 03/31/2019   Polycythemia, secondary 06/04/2018   Macrocytosis 12/27/2017   Upper airway cough syndrome 12/24/2017   Neuropathy 10/23/2016   Hemothorax on left 08/21/2016   Rib fractures 08/05/2016   OSA (obstructive sleep apnea) 05/02/2016   Asthma 04/13/2016   Asbestosis ([place]) 02/16/2016   Sleep disturbance 07/06/2015   Alcohol abuse 10/19/2014   GAD (generalized anxiety disorder) 07/01/2014   Hypothyroidism 12/03/2013   Osteopenia 03/11/2013   History of colonic polyps 07/22/2012   Cigarette smoker 07/12/2011   Essential hypertension    COPD GOLD III     High cholesterol    Hilar adenopathy     Immunization History  Administered Date(s) Administered   Influenza Split 02/12/2012, 02/11/2013   Influenza Whole 03/12/2011   Influenza,inj,Quad PF,6+ Mos 02/21/2015, 03/07/2016, 03/06/2017, 02/20/2018, 03/10/2019, 02/02/2020   Moderna SARS-COV2 Booster Vaccination 04/19/2020, 10/18/2020   Moderna Sars-Covid-2 Vaccination 08/05/2019, 08/31/2019   Pneumococcal Conjugate-13 02/21/2015   Pneumococcal Polysaccharide-23 04/14/2013   Td 05/14/2002   Tdap 02/06/2013    Conditions to be addressed/monitored: COPD and DMII  Care Plan : PHARMD MEDICATION MANAGEMENT  Updates made by Lavera Guise, [place] since 12/28/2020 12:00 AM      Problem: DISEASE PROGRESSION PREVENTION      Long-Range Goal: T2DM,COPD   Recent Progress: On track  Priority: High  Note:   Current Barriers:  Unable to independently afford treatment regimen  Pharmacist Clinical Goal(s):  Over the next 90 days, patient will verbalize ability to afford treatment regimen maintain control of T2DM as evidenced by [place]  through collaboration with PharmD and provider.    Interventions: 1:1 collaboration with Claretta Fraise, MD regarding development and update of comprehensive plan of care as evidenced  by provider attestation and co-signature Inter-disciplinary care team collaboration (see longitudinal plan of care) Comprehensive medication review performed; medication list updated in electronic medical record  Diabetes: Ccontrolled; current treatment:FARXIGA 10MG, METFORMIN;  A1C 5.9%  MAY BE ABLE TO D/C METFORMIN IN FUTURE GFR 82 CAN'T AFFORD FARXIGA--approved for farxiga via AZ&ME PATIENT ASSISTANCE Current glucose readings: fasting glucose: 110-125, post prandial glucose: 135-165 Denies hypoglycemic/hyperglycemic symptoms Discussed meal planning options and Plate method for healthy eating Avoid sugary drinks and desserts Incorporate balanced protein, non starchy veggies, 1 serving of carbohydrate with each meal Increase water intake Increase physical activity as able Current exercise: N/A Educated on FARXIGA, [place] patient finances. Patient approved for farxiga & breztri via AZ&ME PATIENT ASSISTANCE--medication to ship to patient's home in 7-10 business days   Chronic Obstructive Pulmonary Disease: Uncontrolled/controlled; current treatment:BREZTRI; albuterol rescue, dupixent GOLD Classification: III (sees Dr. Melvyn Novas for pulm) Most recent Pulmonary Function Testing: TLC  Date Value Ref Range Status  12/21/2016 6.47 L Final  0 exacerbations requiring treatment in the last 6 months  Assessed  patient finances. Patient approved for farxiga & breztri  AZ&ME PATIENT ASSISTANCE FOR BREZTRI   Patient Goals/Self-Care Activities Over the next 90 days, patient will:  - take medications as prescribed  Follow Up Plan: Telephone follow up appointment with care management team member scheduled for: 1 MONTH      Medication Assistance:  breztri/farxiga obtained through AZ&ME--SHIPS TO PATIENT'S HOME medication assistance program.  Enrollment ends 05/13/21  Patient's preferred pharmacy is:  [id number] [place]Phone: [phone]: [phone] [address] [place]Phone: [phone]: [phone] [place] [address] [address]Phone: [phone]: [phone] [facility]  [place] [address]Phone: [phone]: [phone] Uses pill box? No - N/A Pt endorses 100% compliance  Follow Up:  Patient agrees to Care Plan and Follow-up.  Plan: Telephone follow up appointment with care management team member scheduled for:  2 MONTHS  JRegina Eck PharmD, BCPS Clinical Pharmacist, [place] Phone [phone]

## 2020-12-28 NOTE — Patient Instructions (Signed)
Visit Information  PATIENT GOALS:  Goals Addressed               This Visit's Progress     Patient Stated     T2DM, COPD (pt-stated)        Current Barriers:  Unable to independently afford treatment regimen  Pharmacist Clinical Goal(s):  Over the next 90 days, patient will verbalize ability to afford treatment regimen maintain control of T2DM as evidenced by [place]  through collaboration with PharmD and provider.    Interventions: 1:1 collaboration with Claretta Fraise, MD regarding development and update of comprehensive plan of care as evidenced by provider attestation and co-signature Inter-disciplinary care team collaboration (see longitudinal plan of care) Comprehensive medication review performed; medication list updated in electronic medical record  Diabetes: Ccontrolled; current treatment:FARXIGA '10MG'$ , METFORMIN;  A1C 5.9%  MAY BE ABLE TO D/C METFORMIN IN FUTURE GFR 82 CAN'T AFFORD FARXIGA--approved for farxiga via AZ&ME PATIENT ASSISTANCE Current glucose readings: fasting glucose: 110-125, post prandial glucose: 135-165 Denies hypoglycemic/hyperglycemic symptoms Discussed meal planning options and Plate method for healthy eating Avoid sugary drinks and desserts Incorporate balanced protein, non starchy veggies, 1 serving of carbohydrate with each meal Increase water intake Increase physical activity as able Current exercise: N/A Educated on FARXIGA, [place] patient finances. Patient approved for farxiga & breztri via AZ&ME PATIENT ASSISTANCE--medication to ship to patient's home in 7-10 business days   Chronic Obstructive Pulmonary Disease: Uncontrolled/controlled; current treatment:BREZTRI; albuterol rescue, dupixent GOLD Classification: III (sees Dr. Melvyn Novas for pulm) Most recent Pulmonary Function Testing: TLC  Date Value Ref Range Status  12/21/2016 6.47 L Final  0 exacerbations requiring treatment in the last 6  months  Assessed patient finances. Patient approved for farxiga & breztri  AZ&ME PATIENT ASSISTANCE FOR BREZTRI   Patient Goals/Self-Care Activities Over the next 90 days, patient will:  - take medications as prescribed  Follow Up Plan: Telephone follow up appointment with care management team member scheduled for: 1 MONTH         The patient verbalized understanding of instructions, educational materials, and care plan provided today and declined offer to receive copy of patient instructions, educational materials, and care plan.   Telephone follow up appointment with care management team member scheduled for: 2 MONTHS  Signature Regina Eck, PharmD, BCPS Clinical Pharmacist, [place]  II Phone [phone]

## 2021-01-12 ENCOUNTER — Other Ambulatory Visit: Payer: Self-pay | Admitting: Family Medicine

## 2021-01-12 DIAGNOSIS — E039 Hypothyroidism, unspecified: Secondary | ICD-10-CM

## 2021-01-12 DIAGNOSIS — I10 Essential (primary) hypertension: Secondary | ICD-10-CM

## 2021-01-13 ENCOUNTER — Other Ambulatory Visit: Payer: Self-pay | Admitting: *Deleted

## 2021-01-13 DIAGNOSIS — E7800 Pure hypercholesterolemia, unspecified: Secondary | ICD-10-CM

## 2021-01-13 MED ORDER — ATORVASTATIN CALCIUM 40 MG PO TABS
ORAL_TABLET | ORAL | 0 refills | Status: DC
Start: 2021-01-13 — End: 2021-03-01

## 2021-01-31 ENCOUNTER — Other Ambulatory Visit: Payer: Self-pay | Admitting: *Deleted

## 2021-01-31 MED ORDER — METFORMIN HCL ER 750 MG PO TB24: 1500.0000 mg | ORAL_TABLET | Freq: Every day | ORAL | 0 refills | Status: DC

## 2021-02-16 ENCOUNTER — Telehealth: Payer: Medicare Other

## 2021-03-01 ENCOUNTER — Encounter: Payer: Self-pay | Admitting: Family Medicine

## 2021-03-01 ENCOUNTER — Ambulatory Visit (INDEPENDENT_AMBULATORY_CARE_PROVIDER_SITE_OTHER): Payer: Medicare Other | Admitting: Family Medicine

## 2021-03-01 ENCOUNTER — Other Ambulatory Visit: Payer: Self-pay

## 2021-03-01 VITALS — BP 150/73 | HR 120 | Temp 98.8°F | Ht 69.0 in | Wt 204.8 lb

## 2021-03-01 DIAGNOSIS — E782 Mixed hyperlipidemia: Secondary | ICD-10-CM | POA: Diagnosis not present

## 2021-03-01 DIAGNOSIS — E7800 Pure hypercholesterolemia, unspecified: Secondary | ICD-10-CM | POA: Diagnosis not present

## 2021-03-01 DIAGNOSIS — J449 Chronic obstructive pulmonary disease, unspecified: Secondary | ICD-10-CM

## 2021-03-01 DIAGNOSIS — E039 Hypothyroidism, unspecified: Secondary | ICD-10-CM | POA: Diagnosis not present

## 2021-03-01 DIAGNOSIS — E119 Type 2 diabetes mellitus without complications: Secondary | ICD-10-CM

## 2021-03-01 DIAGNOSIS — Z23 Encounter for immunization: Secondary | ICD-10-CM | POA: Diagnosis not present

## 2021-03-01 DIAGNOSIS — I10 Essential (primary) hypertension: Secondary | ICD-10-CM

## 2021-03-01 LAB — BAYER DCA HB A1C WAIVED: HB A1C (BAYER DCA - WAIVED): 5.4 % (ref 4.8–5.6)

## 2021-03-01 MED ORDER — ATORVASTATIN CALCIUM 40 MG PO TABS: ORAL_TABLET | ORAL | 3 refills | Status: DC

## 2021-03-01 MED ORDER — OLMESARTAN MEDOXOMIL-HCTZ 40-25 MG PO TABS: 1.0000 | ORAL_TABLET | Freq: Every day | ORAL | 3 refills | Status: DC

## 2021-03-01 MED ORDER — METFORMIN HCL ER 750 MG PO TB24: 1500.0000 mg | ORAL_TABLET | Freq: Every day | ORAL | 3 refills | Status: DC

## 2021-03-01 NOTE — Progress Notes (Signed)
Subjective:  Patient ID: Frank Rosales,  male    DOB: [birth year]  Age: 65 y.o.    CC: Medical Management of Chronic Issues   HPI Frank Rosales presents for  follow-up of hypertension. Patient has no history of headache chest pain or shortness of breath or recent cough. Patient also denies symptoms of TIA such as numbness weakness lateralizing. Patient denies side effects from medication. States taking it regularly.  Patient also  in for follow-up of elevated cholesterol. Doing well without complaints on current medication. Denies side effects  including myalgia and arthralgia and nausea. Also in today for liver function testing. Currently no chest pain, shortness of breath or other cardiovascular related symptoms noted.  Follow-up of diabetes. Patient does check blood sugar at home. Readings run between 130-160 fasting and 130-150 prandial. Patient denies symptoms such as excessive hunger or urinary frequency, excessive hunger, nausea No significant hypoglycemic spells noted. Medications reviewed. Pt reports taking them regularly. Pt. denies complication/adverse reaction today.   Losing weight by diet and exercise. Feels better. Not as dyspneic. Still coughs sputum q AM   History Frank Rosales has a past medical history of Anxiety, Arthritis, Asthma, Atypical nevus (03/27/2011), Atypical nevus (01/20/2007), Colon polyps, COPD (chronic obstructive pulmonary disease) ([place]), Diabetes mellitus without complication ([place]), High blood pressure, High cholesterol, Hilar adenopathy, Hypothyroidism, Multiple rib fractures (08/2016), Nodular basal cell carcinoma (BCC) (02/18/2020), SCCA (squamous cell carcinoma) of skin (02/18/2020), SCCA (squamous cell carcinoma) of skin (02/18/2020), SCCA (squamous cell carcinoma) of skin (02/18/2020), Squamous cell carcinoma in situ (SCCIS) (01/09/2016), and Superficial nodular basal cell carcinoma (BCC) (01/09/2016).   Frank Rosales has a past surgical history that includes  Knee arthroscopy (Right, 05/14/2010); Wisdom tooth extraction; epidural injections; Fixation kyphoplasty lumbar spine; Back surgery; Video assisted thoracoscopy (Left, 08/22/2016); Pleural effusion drainage (Left, 08/22/2016); and Rib plating (Left, 08/22/2016).   His family history includes Diabetes in his mother; Emphysema in his father; Heart disease in his brother; Heart failure in his mother; Lung cancer in his father.Frank Rosales reports that Frank Rosales quit smoking about 3 years ago. His smoking use included cigarettes. Frank Rosales has a 67.50 pack-year smoking history. Frank Rosales has never used smokeless tobacco. Frank Rosales reports current alcohol use of about 18.0 standard drinks per week. Frank Rosales reports that Frank Rosales does not use drugs.  Current Outpatient Medications on File Prior to Visit  Medication Sig Dispense Refill   acetaminophen (TYLENOL) 325 MG tablet Take 2 tablets (650 mg total) by mouth every 6 (six) hours as needed.     albuterol (PROAIR HFA) 108 (90 Base) MCG/ACT inhaler Inhale 2 puffs into the lungs every 6 (six) hours. 54 g 1   albuterol (PROVENTIL) (2.5 MG/3ML) 0.083% nebulizer solution Take 3 mLs (2.5 mg total) by nebulization every 6 (six) hours as needed for wheezing or shortness of breath. 240 mL 5   Ascorbic Acid (VITAMIN C) 100 MG tablet Take 100 mg by mouth daily.     aspirin 81 MG tablet Take 81 mg by mouth daily.      Budeson-Glycopyrrol-Formoterol (BREZTRI AEROSPHERE) 160-9-4.8 MCG/ACT AERO Inhale 2 puffs into the lungs 2 (two) times daily. 32.1 g 3   cholecalciferol (VITAMIN D) 1000 UNITS tablet Take 1,000 Units by mouth daily.      dapagliflozin propanediol (FARXIGA) 10 MG TABS tablet Take 1 tablet (10 mg total) by mouth daily before breakfast. 90 tablet 3   dupilumab (DUPIXENT) 300 MG/2ML prefilled syringe Inject 300 mg into the skin every 14 (fourteen) days. Starting at day 54  for maintenance. 6 mL 2   Famotidine (PEPCID PO) Take 1 tablet by mouth daily.     folic acid (FOLVITE) 1 MG tablet Take 1 tablet (1 mg  total) by mouth daily. 90 tablet 3   levothyroxine (SYNTHROID) 50 MCG tablet TAKE 1 TABLET BY MOUTH IN  THE MORNING BEFORE  BREAKFAST WAIT 1 HOUR AFTER TAKING TO EAT OR DRINK 90 tablet 3   NIFEdipine (ADALAT CC) 60 MG 24 hr tablet Take 1 tablet (60 mg total) by mouth 2 (two) times daily. 180 tablet 3   OneTouch Delica Lancets 16X MISC Apply topically.     ONETOUCH VERIO test strip SMARTSIG:Via Meter 1-4 Times Daily     triamcinolone cream (KENALOG) 0.1 %      vitamin B-12 (CYANOCOBALAMIN) 250 MCG tablet Take 250 mcg by mouth daily.     No current facility-administered medications on file prior to visit.    ROS Review of Systems  Constitutional:  Negative for fever.  Respiratory:  Negative for shortness of breath.   Cardiovascular:  Negative for chest pain.  Musculoskeletal:  Negative for arthralgias.  Skin:  Negative for rash.   Objective:  BP (!) 150/73   Pulse (!) 120   Temp 98.8 F (37.1 C)   Ht _0  (1.753 m)   Wt 204 lb 12.8 oz (92.9 kg)   SpO2 92%   BMI 30.24 kg/m   BP Readings from Last 3 Encounters:  03/01/21 (!) 150/73  11/29/20 (!) 158/88  10/25/20 140/75    Wt Readings from Last 3 Encounters:  03/01/21 204 lb 12.8 oz (92.9 kg)  11/29/20 212 lb (96.2 kg)  11/28/20 220 lb (99.8 kg)     Physical Exam Vitals reviewed.  Constitutional:      Appearance: Frank Rosales is well-developed.  HENT:     Head: Normocephalic and atraumatic.     Right Ear: External ear normal.     Left Ear: External ear normal.     Mouth/Throat:     Pharynx: No oropharyngeal exudate or posterior oropharyngeal erythema.  Eyes:     Pupils: Pupils are equal, round, and reactive to light.  Cardiovascular:     Rate and Rhythm: Normal rate and regular rhythm.     Heart sounds: No murmur heard. Pulmonary:     Effort: Pulmonary effort is normal. No respiratory distress.     Comments: Faint rhonchi with diminished exchange. Decreased exp. Phase. 1:1 ratio inspiration to  expiration Musculoskeletal:     Cervical back: Normal range of motion and neck supple.  Neurological:     Mental Status: Frank Rosales is alert and oriented to person, place, and time.    Diabetic Foot Exam - Simple   No data filed       Assessment & Plan:   Frank Rosales was seen today for medical management of chronic issues.  Diagnoses and all orders for this visit:  Diabetes mellitus, new onset ([place]) -     Bayer DCA Hb A1c Waived  Hypothyroidism, unspecified type -     TSH + free T4  Essential hypertension -     CBC with Differential/Platelet -     CMP14+EGFR  Mixed hyperlipidemia -     Lipid panel  High cholesterol -     atorvastatin (LIPITOR) 40 MG tablet; TAKE 1 TABLET BY MOUTH  DAILY FOR CHOLESTEROL  COPD GOLD III   Other orders -     metFORMIN (GLUCOPHAGE-XR) 750 MG 24 hr tablet; Take 2 tablets (1,500 mg  total) by mouth daily with breakfast. -     olmesartan-hydrochlorothiazide (BENICAR HCT) 40-25 MG tablet; Take 1 tablet by mouth daily.  I have discontinued Graceson A. Urbanski's blood glucose meter kit and supplies and olmesartan. I am also having him start on olmesartan-hydrochlorothiazide. Additionally, I am having him maintain his aspirin, cholecalciferol, vitamin I-02, folic acid, acetaminophen, albuterol, triamcinolone cream, Famotidine (PEPCID PO), vitamin C, albuterol, Breztri Aerosphere, NIFEdipine, dapagliflozin propanediol, Dupixent, OneTouch Verio, OneTouch Delica Lancets 66N, levothyroxine, atorvastatin, and metFORMIN.  Meds ordered this encounter  Medications   atorvastatin (LIPITOR) 40 MG tablet    Sig: TAKE 1 TABLET BY MOUTH  DAILY FOR CHOLESTEROL    Dispense:  90 tablet    Refill:  3   metFORMIN (GLUCOPHAGE-XR) 750 MG 24 hr tablet    Sig: Take 2 tablets (1,500 mg total) by mouth daily with breakfast.    Dispense:  180 tablet    Refill:  3   olmesartan-hydrochlorothiazide (BENICAR HCT) 40-25 MG tablet    Sig: Take 1 tablet by mouth daily.    Dispense:  90  tablet    Refill:  3     Follow-up: Return in about 3 months (around 06/01/2021).  Claretta Fraise, M.D.

## 2021-03-02 LAB — CMP14+EGFR
ALT: 22 IU/L (ref 0–44)
AST: 28 IU/L (ref 0–40)
Albumin/Globulin Ratio: 1.6 (ref 1.2–2.2)
Albumin: 4.7 g/dL (ref 3.8–4.8)
Alkaline Phosphatase: 96 IU/L (ref 44–121)
BUN/Creatinine Ratio: 9 — ABNORMAL LOW (ref 10–24)
BUN: 8 mg/dL (ref 8–27)
Bilirubin Total: 0.6 mg/dL (ref 0.0–1.2)
CO2: 16 mmol/L — ABNORMAL LOW (ref 20–29)
Calcium: 9.6 mg/dL (ref 8.6–10.2)
Chloride: 94 mmol/L — ABNORMAL LOW (ref 96–106)
Creatinine, Ser: 0.89 mg/dL (ref 0.76–1.27)
Globulin, Total: 3 g/dL (ref 1.5–4.5)
Glucose: 137 mg/dL — ABNORMAL HIGH (ref 70–99)
Potassium: 4.5 mmol/L (ref 3.5–5.2)
Sodium: 133 mmol/L — ABNORMAL LOW (ref 134–144)
Total Protein: 7.7 g/dL (ref 6.0–8.5)
eGFR: 96 mL/min/{1.73_m2} (ref >59)

## 2021-03-02 LAB — CBC WITH DIFFERENTIAL/PLATELET
Basophils Absolute: 0.1 10*3/uL (ref 0.0–0.2)
Basos: 1 %
EOS (ABSOLUTE): 0.2 10*3/uL (ref 0.0–0.4)
Eos: 3 %
Hematocrit: 50.3 % (ref 37.5–51.0)
Hemoglobin: 18 g/dL — ABNORMAL HIGH (ref 13.0–17.7)
Immature Grans (Abs): 0 10*3/uL (ref 0.0–0.1)
Immature Granulocytes: 0 %
Lymphocytes Absolute: 1.7 10*3/uL (ref 0.7–3.1)
Lymphs: 18 %
MCH: 34.7 pg — ABNORMAL HIGH (ref 26.6–33.0)
MCHC: 35.8 g/dL — ABNORMAL HIGH (ref 31.5–35.7)
MCV: 97 fL (ref 79–97)
Monocytes Absolute: 0.8 10*3/uL (ref 0.1–0.9)
Monocytes: 8 %
Neutrophils Absolute: 6.8 10*3/uL (ref 1.4–7.0)
Neutrophils: 70 %
Platelets: 240 10*3/uL (ref 150–450)
RBC: 5.18 x10E6/uL (ref 4.14–5.80)
RDW: 11.7 % (ref 11.6–15.4)
WBC: 9.5 10*3/uL (ref 3.4–10.8)

## 2021-03-02 LAB — TSH+FREE T4
Free T4: 1.41 ng/dL (ref 0.82–1.77)
TSH: 2.48 u[IU]/mL (ref 0.450–4.500)

## 2021-03-02 LAB — LIPID PANEL
Chol/HDL Ratio: 2.7 ratio (ref 0.0–5.0)
Cholesterol, Total: 228 mg/dL — ABNORMAL HIGH (ref 100–199)
HDL: 83 mg/dL (ref >39)
LDL Chol Calc (NIH): 121 mg/dL — ABNORMAL HIGH (ref 0–99)
Triglycerides: 141 mg/dL (ref 0–149)
VLDL Cholesterol Cal: 24 mg/dL (ref 5–40)

## 2021-03-02 NOTE — Progress Notes (Signed)
Hello Jeramih,  Your lab result is normal and/or stable.Some minor variations that are not significant are commonly marked abnormal, but do not represent any medical problem for you.  Best regards, Claretta Fraise, M.D.

## 2021-03-28 ENCOUNTER — Other Ambulatory Visit: Payer: Self-pay | Admitting: Family Medicine

## 2021-03-29 ENCOUNTER — Ambulatory Visit: Payer: Medicare Other | Admitting: Pharmacist

## 2021-03-29 DIAGNOSIS — J441 Chronic obstructive pulmonary disease with (acute) exacerbation: Secondary | ICD-10-CM

## 2021-03-31 ENCOUNTER — Telehealth: Payer: Self-pay | Admitting: Internal Medicine

## 2021-03-31 NOTE — Telephone Encounter (Signed)
I have attempted to call the pt but no answer and no VM    will try back later.

## 2021-03-31 NOTE — Telephone Encounter (Signed)
No problem - this is common reaction to the vaccine  - ok to treat with zyrtec otc prn

## 2021-03-31 NOTE — Telephone Encounter (Signed)
Spoke with the pt and notified of response per Dr Wert He verbalized understanding  Nothing further needed 

## 2021-03-31 NOTE — Telephone Encounter (Signed)
"  Pt had covid booster on 11/15 and started having cold symptoms of cough and stuffy nose on 11/16.  No fever.  No covid test.  Has appt Monday, wanted to know if OK to come in.  Please advise."  Dr. Melvyn Novas please advise if you're okay with pt coming into office

## 2021-04-03 ENCOUNTER — Ambulatory Visit: Payer: Medicare Other | Admitting: Internal Medicine

## 2021-04-03 ENCOUNTER — Other Ambulatory Visit: Payer: Self-pay

## 2021-04-03 ENCOUNTER — Encounter: Payer: Self-pay | Admitting: Internal Medicine

## 2021-04-03 VITALS — BP 142/82 | HR 112 | Temp 98.9°F | Ht 67.0 in | Wt 203.0 lb

## 2021-04-03 DIAGNOSIS — J441 Chronic obstructive pulmonary disease with (acute) exacerbation: Secondary | ICD-10-CM | POA: Insufficient documentation

## 2021-04-03 DIAGNOSIS — R0989 Other specified symptoms and signs involving the circulatory and respiratory systems: Secondary | ICD-10-CM | POA: Diagnosis not present

## 2021-04-03 DIAGNOSIS — J449 Chronic obstructive pulmonary disease, unspecified: Secondary | ICD-10-CM

## 2021-04-03 MED ORDER — PREDNISONE 10 MG PO TABS: ORAL_TABLET | ORAL | 0 refills | Status: DC

## 2021-04-03 MED ORDER — PANTOPRAZOLE SODIUM 40 MG PO TBEC: 40.0000 mg | DELAYED_RELEASE_TABLET | Freq: Every day | ORAL | 2 refills | Status: DC

## 2021-04-03 MED ORDER — AZITHROMYCIN 250 MG PO TABS: ORAL_TABLET | ORAL | 0 refills | Status: DC

## 2021-04-03 MED ORDER — METHYLPREDNISOLONE ACETATE 80 MG/ML IJ SUSP
80.0000 mg | Freq: Once | INTRAMUSCULAR | Status: AC
Start: 2021-04-03 — End: 2021-04-03
  Administered 2021-04-03: 80 mg via INTRAMUSCULAR

## 2021-04-03 MED ORDER — ALBUTEROL SULFATE (2.5 MG/3ML) 0.083% IN NEBU: 2.5000 mg | INHALATION_SOLUTION | RESPIRATORY_TRACT | 5 refills | Status: AC | PRN

## 2021-04-03 NOTE — Progress Notes (Signed)
Subjective:   Patient ID: Frank Rosales, male    DOB: [birth year]    MRN: [id number]    Brief patient profile:  19  yowm/MM "quit smoking"  11/2017  with h/o exposure to Asbestosis at [place] ? Into the 80s  still able to walk  flat fine but steps x 3 flights sob with GOLD III copd 2013 prev eval by Dr Joya Gaskins  / gets annual f/u in [place]     History of Present Illness  02/16/2016  f/u ov/Frank Rosales re:  GOLD III copd /  symbicort 160 2bid / twice weekly saba  Chief Complaint  Patient presents with   Follow-up    Pt states had CT Chest done 12/27/15- ordered by worker's comp for eval of previous asbestos exp. He states he had been doing well until July 2017 developed cough and congestion that lasted 2 months, but starting to improve.   sick July - September 2017 rx with abx/ steroids better since early Sept 2017 but CT was done in middle of this illness and was abnormal but was done for purpose of screening for asbestos  rec Plan A = Automatic = symbicort 160 Take 2 puffs first thing in am and then another 2 puffs about 12 hours later.  Work on inhaler technique:   Only use your albuterol(proair)  as a rescue medication  The key is to stop smoking completely before smoking completely stops you!       02/01/2020  f/u ov/[place] office/Frank Rosales re: GOLD III copd maint on symbicort/ dupixent per derm Chief Complaint  Patient presents with   Follow-up    Patient feels like his breathing has got worse since last visit. Struggles to breath with any exertion. Cough in morning with clear sputum.  Dyspnea:  Variable doe/ worse in shower / also limited by back pain which is chronic  Walks to shop x 50 yards [place] coming back to house  Cough: some in am better p coffee  Sleeping: bed is flat/ two pillows  SABA use: once a week 02: none rec Try albuterol 15 min before an activity that you know would make you short of breath and see if it makes any difference and if makes none then don't take  it after activity unless you can't catch your breath.  Plan A = Automatic = Always=    Change to Breztri Take 2 puffs first thing in am and then another 2 puffs about 12 hours later.  Plan B = Backup (to supplement plan A, not to replace it) Only use your albuterol inhaler as a rescue medication Plan C = Crisis (instead of Plan B but only if Plan B stops working) - only use your albuterol nebulizer if you first try Plan B and it fails to help > ok to use the nebulizer up to every 4 hours but if start needing it regularly call for immediate appointment       09/16/2020  f/u ov/[place] office/Frank Rosales re:  Frank Rosales III / breztri/ dupixent per derm   Chief Complaint  Patient presents with   Follow-up    Productive cough with a little green phlegm and sometimes clear phlegm  Dyspnea:  Walks to shop x 50 yards s stopping  - not real change symb vs breztri per pt  Cough:slt green just in am chronically/ no recent change  Sleeping: flat bed/ 2 pillows  SABA use: minimal 02: none  Covid status: vax x 3  Lung cancer screening :  Feb 2022  Frank Rosales neg per pt ( asbestosis "mild" ) Rec Plan A = Automatic = Always=    breztri or symbicort 160  Take 2 puffs first thing in am and then another 2 puffs about 12 hours later.  Plan B = Backup (to supplement plan A, not to replace it) Only use your albuterol inhaler as a rescue medication Plan C = Crisis (instead of Plan B but only if Plan B stops working) - only use your albuterol nebulizer if you first try Plan B and it fails to help > ok to use the nebulizer up to every 4 hours but if start needing it regularly call for immediate appointment    04/03/2021  f/u ov/Frank Rosales office/Frank Rosales re: GOLD 3 maint on breztri / dupixent  and hs pepcid  Chief Complaint  Patient presents with   Follow-up    Coughing up green mucus, chest and throat are hurting. Increased in SOB. Has taken dayquil and mucinex since Tues. 03-28-21 after taking booster. No  fever. Neg. Covid test yesterday.    Dyspnea:  able to walk to shop "just barely" at [place]  Cough: worse than usual 03/28/21 when got bivalent vaccine / neg covid Ag 04/02/21 Sleeping: flat bed / 2pillows before and after  SABA use: just using hfa at baseline, even when sick just hfa  02: none  Covid status: vax x 5      No obvious patterns in day to day or daytime variability or assoc mucus plugs or hemoptysis or cp or chest tightness, subjective wheeze or overt sinus or hb symptoms.   Sleeping  without nocturnal  or early am exacerbation  of respiratory  c/o's or need for noct saba. Also denies any obvious fluctuation of symptoms with weather or environmental changes or other aggravating or alleviating factors except as outlined above   No unusual exposure hx or h/o childhood pna/ asthma or knowledge of premature birth.  Current Allergies, Complete Past Medical History, Past Surgical History, Family History, and Social History were reviewed in [employer] [employer] record.  ROS  The following are not active complaints unless bolded Hoarseness, sore throat, dysphagia, dental problems, itching, sneezing,  nasal congestion or discharge of excess mucus or purulent secretions, ear ache,   fever, chills, sweats, unintended wt loss or wt gain, classically pleuritic or exertional cp,  orthopnea pnd or arm/hand swelling  or leg swelling, presyncope, palpitations, abdominal pain, anorexia, nausea, vomiting, diarrhea  or change in bowel habits or change in bladder habits, change in stools or change in urine, dysuria, hematuria,  rash, arthralgias, visual complaints, headache, numbness, weakness or ataxia or problems with walking or coordination,  change in mood or  memory.        Current Meds  Medication Sig   acetaminophen (TYLENOL) 325 MG tablet Take 2 tablets (650 mg total) by mouth every 6 (six) hours as needed.   albuterol (PROAIR HFA) 108 (90 Base) MCG/ACT inhaler Inhale 2 puffs  into the lungs every 6 (six) hours.   albuterol (PROVENTIL) (2.5 MG/3ML) 0.083% nebulizer solution Take 3 mLs (2.5 mg total) by nebulization every 6 (six) hours as needed for wheezing or shortness of breath.   Ascorbic Acid (VITAMIN C) 100 MG tablet Take 100 mg by mouth daily.   aspirin 81 MG tablet Take 81 mg by mouth daily.    atorvastatin (LIPITOR) 40 MG tablet TAKE 1 TABLET BY MOUTH  DAILY FOR CHOLESTEROL   Budeson-Glycopyrrol-Formoterol (BREZTRI AEROSPHERE) 160-9-4.8 MCG/ACT AERO  Inhale 2 puffs into the lungs 2 (two) times daily.   cholecalciferol (VITAMIN D) 1000 UNITS tablet Take 1,000 Units by mouth daily.    dapagliflozin propanediol (FARXIGA) 10 MG TABS tablet Take 1 tablet (10 mg total) by mouth daily before breakfast.   dupilumab (DUPIXENT) 300 MG/2ML prefilled syringe Inject 300 mg into the skin every 14 (fourteen) days. Starting at day 15 for maintenance.   Famotidine (PEPCID PO) Take 1 tablet by mouth daily.   folic acid (FOLVITE) 1 MG tablet Take 1 tablet (1 mg total) by mouth daily.   levothyroxine (SYNTHROID) 50 MCG tablet TAKE 1 TABLET BY MOUTH IN  THE MORNING BEFORE  BREAKFAST WAIT 1 HOUR AFTER TAKING TO EAT OR DRINK   metFORMIN (GLUCOPHAGE-XR) 750 MG 24 hr tablet TAKE 2 TABLETS BY MOUTH  DAILY WITH BREAKFAST   NIFEdipine (ADALAT CC) 60 MG 24 hr tablet Take 1 tablet (60 mg total) by mouth 2 (two) times daily.   olmesartan-hydrochlorothiazide (BENICAR HCT) 40-25 MG tablet Take 1 tablet by mouth daily.   OneTouch Delica Lancets 16X MISC Apply topically.   ONETOUCH VERIO test strip SMARTSIG:Via Meter 1-4 Times Daily   triamcinolone cream (KENALOG) 0.1 %    vitamin B-12 (CYANOCOBALAMIN) 250 MCG tablet Take 250 mcg by mouth daily.                           Objective:   Physical Exam    04/03/2021  203  09/16/2020      222 03/03/2020  219  02/01/2020    217 10/05/2019    228 03/31/2019  213  01/24/2015        180 > 02/21/2015  180  > 06/16/2015  186  > 02/16/2016  205 >  09/13/2016   185 >  11/07/2016  187 >  12/21/2016  195 > 03/25/2017   200 > 06/25/2017   198  > 12/23/2017   199  > 06/13/2018  200      11/10/14 174 lb (78.926 kg)  11/08/14 176 lb 6.4 oz (80.015 kg)  10/27/14 175 lb (79.379 kg)   Vital signs reviewed  04/03/2021  - Note at rest 02 sats  92% on RA   General appearance:    chronically ill hoarse wm nad   HEENT : pt wearing mask not removed for exam due to covid -19 concerns.    NECK :  without JVD/Nodes/TM/ nl carotid upstrokes bilaterally   LUNGS: no acc muscle use,  Mod barrel  contour chest wall with bilateral  Distant bs s audible wheeze and  without cough on insp or exp maneuvers and mod  Hyperresonant  to  percussion bilaterally     CV:  RRR  no s3 or murmur or increase in P2, and no edema   ABD:  soft and nontender with pos mid insp Hoover's  in the supine position. No bruits or organomegaly appreciated, bowel sounds nl  MS:     ext warm without deformities, calf tenderness, cyanosis or clubbing No obvious joint restrictions   SKIN: warm and dry without lesions    NEURO:  alert, approp, nl sensorium with  no motor or cerebellar deficits apparent.          Assessment & Plan:

## 2021-04-03 NOTE — Patient Instructions (Addendum)
Plan A = Automatic = Always=    Breztri Take 2 puffs first thing in am and then another 2 puffs about 12 hours later.     Plan B = Backup (to supplement plan A, not to replace it) Only use your albuterol inhaler as a rescue medication to be used if you can't catch your breath by resting or doing a relaxed purse lip breathing pattern.  - The less you use it, the better it will work when you need it. - Ok to use the inhaler up to 2 puffs  every 4 hours if you must but call for appointment if use goes up over your usual need - Don't leave home without it !!  (think of it like the spare tire for your car)   Plan C = Crisis (instead of Plan B but only if Plan B stops working) - only use your albuterol nebulizer if you first try Plan B and it fails to help > ok to use the nebulizer up to every 4 hours but if start needing it regularly call for immediate appointment  For flares of coughing: Immediately start protonix 40 mg Take 30-60 min before first meal of the day until cough is better for a week and no longer need max dose of mucinex dm 1200  mg every 12 hours   Zpak   Prednisone 10 mg take  4 each am x 2 days,   2 each am x 2 days,  1 each am x 2 days and stop   Depomedrol 80 mg IM today   Please schedule a follow up visit in 6 months but call sooner if needed

## 2021-04-04 ENCOUNTER — Encounter: Payer: Self-pay | Admitting: Internal Medicine

## 2021-04-04 NOTE — Assessment & Plan Note (Signed)
Quit smoking Spirometry 11/2017   03/2011 FeV1 49%    Spirometry  02/28/2012  FEV1 45%   - Last day worked = October 13 2014  - PFT's  01/24/2015  FEV1 1.59  (43 % ) ratio 54   p 22 % improvement from saba with DLCO  88 % corrects to 99 % for alv volume s symbicort  - PFTs  12/27/15 1.30 (35%) ratio 35  In midst of a flare- - PFT's  12/21/2016  FEV1 1.40 (39 % ) ratio 50  p 8 % improvement from saba p symbicort 160 x 2  prior to study with DLCO  71/69 % corrects to 85  % for alv volume - PFTS  05/16/17  FEV1  1.32( 37%)  Ratio 37  And DLCO  52% > corrects to 78%  03/25/2017  try bevespi > preferred symb 160  - 06/13/2018    try adding spiriva 2.5 x 2 each am sample  > no better so did not fill rx  - 03/31/2019   Walked RA x one lap =  approx 250 ft @ mod pace - stopped due to sob with sats of 92% at the end of the study. - 02/01/2020  After extensive coaching inhaler device,  effectiveness =  90%  - alpha one AT def screen  02/02/20   MM  Level 151  -  09/16/2020   Walked RA   Moderat pace walk total 300 feet feeling short of breath at 200 feet but sats still 98% at end typical of a pink puffer      Group D in terms of symptom/risk and laba/lama/ICS  therefore appropriate rx at this point >>>  Continue breztri and approp saba using ABC plan - see avs for instructions unique to this ov    For acute flare: zpak /pred and max rx for gerd while coughing          Each maintenance medication was reviewed in detail including emphasizing most importantly the difference between maintenance and prns and under what circumstances the prns are to be triggered using an action plan format where appropriate.  Total time for H and P, chart review, counseling, reviewing hfa device(s) and generating customized AVS unique to this acute office visit / same day charting > 30 min

## 2021-04-04 NOTE — Progress Notes (Signed)
     03/29/2021 Name: Frank Rosales MRN: [id number] DOB: 10-16-1955   Referred by: Claretta Fraise, MD Reason for referral : COPD  An unsuccessful telephone outreach was attempted today  Follow Up Plan:  will await patient returned call   Regina Eck, PharmD, BCPS Clinical Pharmacist, [place]  II Phone [phone]

## 2021-04-24 ENCOUNTER — Other Ambulatory Visit: Payer: Self-pay | Admitting: Family Medicine

## 2021-04-24 DIAGNOSIS — E7800 Pure hypercholesterolemia, unspecified: Secondary | ICD-10-CM

## 2021-04-25 ENCOUNTER — Telehealth: Payer: Self-pay | Admitting: *Deleted

## 2021-04-25 NOTE — Telephone Encounter (Signed)
Fax from [place] my way- patient will received dupixent at no cost thorough 05-13-2022.  Reference number : [id number]

## 2021-05-16 ENCOUNTER — Telehealth: Payer: Self-pay | Admitting: Family Medicine

## 2021-05-16 MED ORDER — DAPAGLIFLOZIN PROPANEDIOL 10 MG PO TABS: 10.0000 mg | ORAL_TABLET | Freq: Every day | ORAL | 0 refills | Status: DC

## 2021-05-16 NOTE — Telephone Encounter (Signed)
Pt is out of dapagliflozin propanediol (FARXIGA) 10 MG TABS tablet and said that he is supposed to get it from [facility] for free but has been out for two weeks. Please call back and advise.

## 2021-05-16 NOTE — Telephone Encounter (Signed)
Pt states that he has called [facility] multiple times for his [place]. They keep telling hinm different times frames or when he will receive his shipment. The latest is first week in Feb.  Pt not sure what to do?  Informed pt that we can give him samples until Feb ( placed up front- pt coming by to pick up on 1/3) We can [facility] months worth [facility] to [facility] it would [facility]- finally agreed to this  Change pharmacies all together or possibly change medications.  For now pt will get samples, will [facility]. Pt will call back to let us know if he received [place] in Feb.

## 2021-05-22 DIAGNOSIS — H26491 Other secondary cataract, right eye: Secondary | ICD-10-CM | POA: Diagnosis not present

## 2021-05-22 DIAGNOSIS — Z961 Presence of intraocular lens: Secondary | ICD-10-CM | POA: Diagnosis not present

## 2021-05-22 DIAGNOSIS — H04123 Dry eye syndrome of bilateral lacrimal glands: Secondary | ICD-10-CM | POA: Diagnosis not present

## 2021-05-31 ENCOUNTER — Ambulatory Visit (INDEPENDENT_AMBULATORY_CARE_PROVIDER_SITE_OTHER): Payer: Medicare Other | Admitting: Pharmacist

## 2021-05-31 DIAGNOSIS — E119 Type 2 diabetes mellitus without complications: Secondary | ICD-10-CM

## 2021-05-31 DIAGNOSIS — J449 Chronic obstructive pulmonary disease, unspecified: Secondary | ICD-10-CM

## 2021-05-31 MED ORDER — DAPAGLIFLOZIN PROPANEDIOL 10 MG PO TABS: 10.0000 mg | ORAL_TABLET | Freq: Every day | ORAL | 5 refills | Status: DC

## 2021-05-31 MED ORDER — BREZTRI AEROSPHERE 160-9-4.8 MCG/ACT IN AERO: 2.0000 | INHALATION_SPRAY | Freq: Two times a day (BID) | RESPIRATORY_TRACT | 11 refills | Status: DC

## 2021-05-31 NOTE — Patient Instructions (Signed)
Visit Information  Following are the goals we discussed today:  Current Barriers:  Unable to independently afford treatment regimen  Pharmacist Clinical Goal(s):  Over the next 90 days, patient will verbalize ability to afford treatment regimen maintain control of T2DM as evidenced by [place]  through collaboration with PharmD and provider.    Interventions: 1:1 collaboration with Claretta Fraise, MD regarding development and update of comprehensive plan of care as evidenced by provider attestation and co-signature Inter-disciplinary care team collaboration (see longitudinal plan of care) Comprehensive medication review performed; medication list updated in electronic medical record  Diabetes: Ccontrolled; current treatment: FARXIGA 10MG , METFORMIN;  A1C 5.4% , GFR 83 Providing access to care for Frank Rosales Re-enrolled for 2023 farxiga via [place] Will ship to patient's home/3 month supply  Refills sent to program for the year Current glucose readings: fasting glucose: 100-120, post prandial glucose: 140-160 Denies hypoglycemic/hyperglycemic symptoms Discussed meal planning options and Plate method for healthy eating Avoid sugary drinks and desserts Incorporate balanced protein, non starchy veggies, 1 serving of carbohydrate with each meal Increase water intake Increase physical activity as able Current exercise: N/A Educated on [place], [place] patient finances. Patient approved for [place] via [place] PATIENT ASSISTANCE--medication to ship to patient's home in 7-10 business days   Chronic Obstructive Pulmonary Disease: Uncontrolled/controlled; current treatment:BREZTRI; albuterol rescue, dupixent GOLD Classification: III (sees Dr. Melvyn Novas for pulm) Most recent Pulmonary Function Testing: TLC  Date Value Ref Range Status  12/21/2016 6.47 L Final   0 exacerbations requiring treatment in the last 6 months   Assessed patient finances. Patient approved for farxiga & breztri  [place] PATIENT ASSISTANCE FOR BREZTRI   Patient Goals/Self-Care Activities Over the next 90 days, patient will:  - take medications as prescribed  Follow Up Plan: Telephone follow up appointment with care management team member scheduled for: 6 months   Plan: Telephone follow up appointment with care management team member scheduled for:  6 months  Signature Regina Eck, PharmD, BCPS Clinical Pharmacist, [place]  II Phone [phone]   Please call the care guide team at [phone] if you need to cancel or reschedule your appointment.   The patient verbalized understanding of instructions, educational materials, and care plan provided today and agreed to receive a mailed copy of patient instructions, educational materials, and care plan.

## 2021-05-31 NOTE — Progress Notes (Signed)
Chronic Care Management Pharmacy Note  05/31/2021 Name:  Frank Rosales MRN:  [id number] DOB:  1956-02-15  Summary: COPD/T2DM  Recommendations/Changes made from today's visit: Diabetes: Ccontrolled; current treatment: FARXIGA 10MG, METFORMIN;  A1C 5.4% , GFR 83 Providing access to care for [place] Re-enrolled for 2023 farxiga via [place] Will ship to patient's home/3 month supply  Refills sent to program for the year Current glucose readings: fasting glucose: 100-120, post prandial glucose: 140-160 Denies hypoglycemic/hyperglycemic symptoms Discussed meal planning options and Plate method for healthy eating Avoid sugary drinks and desserts Incorporate balanced protein, non starchy veggies, 1 serving of carbohydrate with each meal Increase water intake Increase physical activity as able Current exercise: N/A Educated on [place], [place] patient finances. Patient approved for [place] via [place] PATIENT ASSISTANCE--medication to ship to patient's home in 7-10 business days   Chronic Obstructive Pulmonary Disease: Uncontrolled/controlled; current treatment:BREZTRI; albuterol rescue, dupixent GOLD Classification: III (sees Dr. Melvyn Novas for pulm) Most recent Pulmonary Function Testing: TLC  Date Value Ref Range Status  12/21/2016 6.47 L Final   0 exacerbations requiring treatment in the last 6 months  Assessed patient finances. Patient approved for farxiga & breztri  [place] PATIENT ASSISTANCE FOR BREZTRI   Patient Goals/Self-Care Activities Over the next 90 days, patient will:  - take medications as prescribed  Follow Up Plan: Telephone follow up appointment with care management team member scheduled for: 6 months  Subjective: Frank Rosales is an 66 y.o. year old male who is a primary patient of Stacks, Cletus Gash, MD.  The CCM team was consulted for assistance with disease management and care coordination needs.    Engaged with  patient by telephone for follow up visit in response to provider referral for pharmacy case management and/or care coordination services.   Consent to Services:  The patient was given information about Chronic Care Management services, agreed to services, and gave verbal consent prior to initiation of services.  Please see initial visit note for detailed documentation.   Patient Care Team: Claretta Fraise, MD as PCP - General (Family Medicine) Tanda Rockers, MD as Consulting Physician (Pulmonary Disease) Lavonna Monarch, MD as Consulting Physician (Dermatology) [facility] Lavera Guise, [facility] as Pharmacist (Family Medicine)  Objective:  Lab Results  Component Value Date   CREATININE 0.89 03/01/2021   CREATININE 1.03 11/29/2020   CREATININE 0.93 08/01/2020    Lab Results  Component Value Date   HGBA1C 5.4 03/01/2021   Last diabetic Eye exam: No results found for: HMDIABEYEEXA  Last diabetic Foot exam: No results found for: HMDIABFOOTEX      Component Value Date/Time   CHOL 228 (H) 03/01/2021 0917   TRIG 141 03/01/2021 0917   TRIG 99 12/03/2013 0858   HDL 83 03/01/2021 0917   HDL 75 12/03/2013 0858   CHOLHDL 2.7 03/01/2021 0917   LDLCALC 121 (H) 03/01/2021 0917   LDLCALC 125 (H) 12/03/2013 0858    Hepatic Function Latest Ref Rng & Units 03/01/2021 11/29/2020 08/01/2020  Total Protein 6.0 - 8.5 g/dL 7.7 7.4 7.6  Albumin 3.8 - 4.8 g/dL 4.7 4.9(H) 4.5  AST 0 - 40 IU/L 28 41(H) 47(H)  ALT 0 - 44 IU/L 22 40 48(H)  Alk Phosphatase 44 - 121 IU/L 96 91 114  Total Bilirubin 0.0 - 1.2 mg/dL 0.6 0.8 0.8    Lab Results  Component Value Date/Time   TSH 2.480 03/01/2021 09:17 AM   TSH 4.010 11/29/2020 11:21  AM   FREET4 1.41 03/01/2021 09:17 AM   FREET4 1.48 11/29/2020 11:21 AM    CBC Latest Ref Rng & Units 03/01/2021 11/29/2020 08/01/2020  WBC 3.4 - 10.8 x10E3/uL 9.5 9.4 11.9(H)  Hemoglobin 13.0 - 17.7 g/dL 18.0(H) 17.2 16.5  Hematocrit 37.5 - 51.0 % 50.3  49.1 48.3  Platelets 150 - 450 x10E3/uL 240 242 258    Lab Results  Component Value Date/Time   VD25OH 38.3 12/27/2017 10:10 AM   VD25OH 37.0 02/03/2013 08:04 AM    Clinical ASCVD: No  The 10-year ASCVD risk score (Arnett DK, et al., 2019) is: 22.3%   Values used to calculate the score:     Age: 57 years     Sex: Male     Is Non-Hispanic African American: No     Diabetic: Yes     Tobacco smoker: No     Systolic Blood Pressure: 601 mmHg     Is BP treated: Yes     HDL Cholesterol: 83 mg/dL     Total Cholesterol: 228 mg/dL    Other: (CHADS2VASc if Afib, PHQ9 if depression, MMRC or CAT for COPD, ACT, DEXA)  Social History   Tobacco Use  Smoking Status Former   Packs/day: 1.50   Years: 45.00   Pack years: 67.50   Types: Cigarettes   Quit date: 11/11/2017   Years since quitting: 3.5  Smokeless Tobacco Never  Tobacco Comments        BP Readings from Last 3 Encounters:  04/03/21 (!) 142/82  03/01/21 (!) 150/73  11/29/20 (!) 158/88   Pulse Readings from Last 3 Encounters:  04/03/21 (!) 112  03/01/21 (!) 120  11/29/20 (!) 108   Wt Readings from Last 3 Encounters:  04/03/21 203 lb 0.6 oz (92.1 kg)  03/01/21 204 lb 12.8 oz (92.9 kg)  11/29/20 212 lb (96.2 kg)    Assessment: Review of patient past medical history, allergies, medications, health status, including review of consultants reports, laboratory and other test data, was performed as part of comprehensive evaluation and provision of chronic care management services.   SDOH:  (Social Determinants of Health) assessments and interventions performed:    CCM Care Plan  Allergies  Allergen Reactions   No Known Allergies     Medications Reviewed Today     Reviewed by Lavera Guise, [facility] (Pharmacist) on 05/31/21 at 1108  Med List Status: <None>   Medication Order Taking? Sig Documenting Provider Last Dose Status Informant  acetaminophen (TYLENOL) 325 MG tablet [id number] No Take 2 tablets (650 mg total) by  mouth every 6 (six) hours as needed. Jill Alexanders, PA-C Taking Active Self  albuterol (PROAIR HFA) 108 (90 Base) MCG/ACT inhaler [id number] No Inhale 2 puffs into the lungs every 6 (six) hours. Claretta Fraise, MD Taking Active   albuterol (PROVENTIL) (2.5 MG/3ML) 0.083% nebulizer solution [id number]  Take 3 mLs (2.5 mg total) by nebulization every 4 (four) hours as needed for wheezing or shortness of breath. Tanda Rockers, MD  Active   Ascorbic Acid (VITAMIN C) 100 MG tablet [id number] No Take 100 mg by mouth daily. [provider] Taking Active   aspirin 81 MG tablet [id number] No Take 81 mg by mouth daily.  [provider] Taking Active Self  atorvastatin (LIPITOR) 40 MG tablet [id number]  TAKE 1 TABLET BY MOUTH  DAILY FOR CHOLESTEROL Stacks, Cletus Gash, MD  Active   azithromycin (ZITHROMAX) 250 MG tablet [id number]  Take 2 on day one then 1 daily  x 4 days Tanda Rockers, MD  Active   Budeson-Glycopyrrol-Formoterol (BREZTRI AEROSPHERE) 160-9-4.8 MCG/ACT AERO [id number] No Inhale 2 puffs into the lungs 2 (two) times daily. Claretta Fraise, MD Taking Active   cholecalciferol (VITAMIN D) 1000 UNITS tablet [id number] No Take 1,000 Units by mouth daily.  [provider] Taking Active Self  dapagliflozin propanediol (FARXIGA) 10 MG TABS tablet [id number]  Take 1 tablet (10 mg total) by mouth daily before breakfast. Claretta Fraise, MD  Active   dupilumab (DUPIXENT) 300 MG/2ML prefilled syringe [id number] No Inject 300 mg into the skin every 14 (fourteen) days. Starting at day 15 for maintenance. Lavonna Monarch, MD Taking Active   Famotidine (PEPCID PO) [id number] No Take 1 tablet by mouth daily. [provider] Taking Active   folic acid (FOLVITE) 1 MG tablet [id number] No Take 1 tablet (1 mg total) by mouth daily. Lysbeth Penner, FNP Taking Active Self  levothyroxine (SYNTHROID) 50 MCG tablet [id number] No TAKE 1 TABLET BY MOUTH IN  THE MORNING BEFORE  BREAKFAST WAIT 1 HOUR AFTER  TAKING TO EAT OR Onnie Graham, MD Taking Active   metFORMIN (GLUCOPHAGE-XR) 750 MG 24 hr tablet [id number] No TAKE 2 TABLETS BY MOUTH  DAILY WITH Gara Kroner, MD Taking Active   NIFEdipine (ADALAT CC) 60 MG 24 hr tablet [id number] No Take 1 tablet (60 mg total) by mouth 2 (two) times daily. Claretta Fraise, MD Taking Active   olmesartan-hydrochlorothiazide (BENICAR HCT) 40-25 MG tablet [id number] No Take 1 tablet by mouth daily. Claretta Fraise, MD Taking Active   OneTouch Delica Lancets 76B MISC [id number] No Apply topically. [provider] Taking Active   [facility] VERIO test strip [id number] No SMARTSIG:Via Meter 1-4 Times Daily [provider] Taking Active   pantoprazole (PROTONIX) 40 MG tablet [id number]  Take 1 tablet (40 mg total) by mouth daily. Take 30-60 min before first meal of the day Tanda Rockers, MD  Active     Discontinued 05/31/21 1108 (Completed Course)   triamcinolone cream (KENALOG) 0.1 % [id number] No  [provider] Taking Active   vitamin B-12 (CYANOCOBALAMIN) 250 MCG tablet [id number] No Take 250 mcg by mouth daily. [provider] Taking Active Self            Patient Active Problem List   Diagnosis Date Noted   COPD with acute exacerbation ([place]) 04/03/2021   Diabetes mellitus, new onset ([place]) 09/04/2020   Mixed hyperlipidemia 02/02/2020   Eczema 03/31/2019   Polycythemia, secondary 06/04/2018   Macrocytosis 12/27/2017   Upper airway cough syndrome 12/24/2017   Neuropathy 10/23/2016   Hemothorax on left 08/21/2016   Rib fractures 08/05/2016   OSA (obstructive sleep apnea) 05/02/2016   Asthma 04/13/2016   Asbestosis ([place]) 02/16/2016   Sleep disturbance 07/06/2015   Alcohol abuse 10/19/2014   GAD (generalized anxiety disorder) 07/01/2014   Hypothyroidism 12/03/2013   Osteopenia 03/11/2013   History of colonic polyps 07/22/2012   Cigarette smoker 07/12/2011   Essential hypertension    COPD GOLD III      High cholesterol    Hilar adenopathy     Immunization History  Administered Date(s) Administered   Influenza Split 02/12/2012, 02/11/2013   Influenza Whole 03/12/2011   Influenza,inj,Quad PF,6+ Mos 02/21/2015, 03/07/2016, 03/06/2017, 02/20/2018, 03/10/2019, 02/02/2020, 03/01/2021   Moderna Covid-19 Vaccine Bivalent Booster 83yr & up 03/28/2021   Moderna SARS-COV2 Booster Vaccination 04/19/2020, 10/18/2020   Moderna Sars-Covid-2 Vaccination 08/05/2019, 08/31/2019   Pneumococcal Conjugate-13 02/21/2015   Pneumococcal Polysaccharide-23  04/14/2013   Td 05/14/2002   Tdap 02/06/2013    Conditions to be addressed/monitored: COPD and DMII  Care Plan : PHARMD MEDICATION MANAGEMENT  Updates made by Lavera Guise, [place] since 05/31/2021 12:00 AM     Problem: DISEASE PROGRESSION PREVENTION      Long-Range Goal: T2DM,COPD   Recent Progress: On track  Priority: High  Note:   Current Barriers:  Unable to independently afford treatment regimen  Pharmacist Clinical Goal(s):  Over the next 90 days, patient will verbalize ability to afford treatment regimen maintain control of T2DM as evidenced by [place]  through collaboration with PharmD and provider.    Interventions: 1:1 collaboration with Claretta Fraise, MD regarding development and update of comprehensive plan of care as evidenced by provider attestation and co-signature Inter-disciplinary care team collaboration (see longitudinal plan of care) Comprehensive medication review performed; medication list updated in electronic medical record  Diabetes: Ccontrolled; current treatment: FARXIGA 10MG, METFORMIN;  A1C 5.4% , GFR 83 Providing access to care for [place] Re-enrolled for 2023 farxiga via [place] Will ship to patient's home/3 month supply  Refills sent to program for the year Current glucose readings: fasting glucose: 100-120, post prandial glucose: 140-160 Denies  hypoglycemic/hyperglycemic symptoms Discussed meal planning options and Plate method for healthy eating Avoid sugary drinks and desserts Incorporate balanced protein, non starchy veggies, 1 serving of carbohydrate with each meal Increase water intake Increase physical activity as able Current exercise: N/A Educated on [place], [place] patient finances. Patient approved for [place] via [place] PATIENT ASSISTANCE--medication to ship to patient's home in 7-10 business days   Chronic Obstructive Pulmonary Disease: Uncontrolled/controlled; current treatment:BREZTRI; albuterol rescue, dupixent GOLD Classification: III (sees Dr. Melvyn Novas for pulm) Most recent Pulmonary Function Testing: TLC  Date Value Ref Range Status  12/21/2016 6.47 L Final  0 exacerbations requiring treatment in the last 6 months  Assessed patient finances. Patient approved for [place]   Patient Goals/Self-Care Activities Over the next 90 days, patient will:  - take medications as prescribed  Follow Up Plan: Telephone follow up appointment with care management team member scheduled for: 6 months      Medication Assistance:  breztri/farxiga obtained through [place]&me medication assistance program.  Enrollment ends 05/13/22  Patient's preferred pharmacy is:  [id number] [place] [address]Phone: [phone]: [phone] [address] [place]Phone: [phone]: [phone] [place] [address] [place] [address]Phone: [phone]: [phone] [facility] ([place]LK[facility] [place]Phone: [phone]:  [phone] [facility] ([facility] ) - [place] [address]Phone: [phone]: [address]Phone: [phone]: [phone] Follow Up:  Patient agrees to Care Plan and Follow-up.  Plan: Telephone follow up appointment with care management team member scheduled for:  6 months   JRegina Eck PharmD, BCPS Clinical Pharmacist, [place] Phone [phone]

## 2021-06-01 ENCOUNTER — Ambulatory Visit (INDEPENDENT_AMBULATORY_CARE_PROVIDER_SITE_OTHER): Payer: Medicare Other | Admitting: Family Medicine

## 2021-06-01 ENCOUNTER — Encounter: Payer: Self-pay | Admitting: Family Medicine

## 2021-06-01 VITALS — BP 135/80 | HR 111 | Temp 98.3°F | Ht 67.0 in | Wt 204.6 lb

## 2021-06-01 DIAGNOSIS — E782 Mixed hyperlipidemia: Secondary | ICD-10-CM

## 2021-06-01 DIAGNOSIS — I10 Essential (primary) hypertension: Secondary | ICD-10-CM

## 2021-06-01 DIAGNOSIS — E039 Hypothyroidism, unspecified: Secondary | ICD-10-CM

## 2021-06-01 DIAGNOSIS — E119 Type 2 diabetes mellitus without complications: Secondary | ICD-10-CM

## 2021-06-01 DIAGNOSIS — Z125 Encounter for screening for malignant neoplasm of prostate: Secondary | ICD-10-CM

## 2021-06-01 LAB — BAYER DCA HB A1C WAIVED: HB A1C (BAYER DCA - WAIVED): 5.7 % — ABNORMAL HIGH (ref 4.8–5.6)

## 2021-06-01 NOTE — Progress Notes (Signed)
Subjective:  Patient ID: Frank Rosales,  male    DOB: 1955-08-17  Age: 66 y.o.    CC: Medical Management of Chronic Issues   HPI Frank Rosales presents for  follow-up of hypertension. Patient has no history of headache chest pain or shortness of breath or recent cough. Patient also denies symptoms of TIA such as numbness weakness lateralizing. Patient denies side effects from medication. States taking it regularly.  Patient also  in for follow-up of elevated cholesterol. Doing well without complaints on current medication. Denies side effects  including myalgia and arthralgia and nausea. Also in today for liver function testing. Currently no chest pain, shortness of breath or other cardiovascular related symptoms noted.  Follow-up of diabetes. Patient does check blood sugar at home. Readings run between 130-150 fasting and 114-135 prandial Patient denies symptoms such as excessive hunger or urinary frequency, excessive hunger, nausea No significant hypoglycemic spells noted. Medications reviewed. Pt reports taking them regularly. Pt. denies complication/adverse reaction today.    History Frank Rosales has a past medical history of Anxiety, Arthritis, Asthma, Atypical nevus (03/27/2011), Atypical nevus (01/20/2007), Colon polyps, COPD (chronic obstructive pulmonary disease) ([place]), Diabetes mellitus without complication ([place]), High blood pressure, High cholesterol, Hilar adenopathy, Hypothyroidism, Multiple rib fractures (08/2016), Nodular basal cell carcinoma (BCC) (02/18/2020), SCCA (squamous cell carcinoma) of skin (02/18/2020), SCCA (squamous cell carcinoma) of skin (02/18/2020), SCCA (squamous cell carcinoma) of skin (02/18/2020), Squamous cell carcinoma in situ (SCCIS) (01/09/2016), and Superficial nodular basal cell carcinoma (BCC) (01/09/2016).   Frank Rosales has a past surgical history that includes Knee arthroscopy (Right, 05/14/2010); Wisdom tooth extraction; epidural injections; Fixation  kyphoplasty lumbar spine; Back surgery; Video assisted thoracoscopy (Left, 08/22/2016); Pleural effusion drainage (Left, 08/22/2016); and Rib plating (Left, 08/22/2016).   His family history includes Diabetes in his mother; Emphysema in his father; Heart disease in his brother; Heart failure in his mother; Lung cancer in his father.Frank Rosales reports that Frank Rosales quit smoking about 3 years ago. His smoking use included cigarettes. Frank Rosales has a 67.50 pack-year smoking history. Frank Rosales has never used smokeless tobacco. Frank Rosales reports current alcohol use of about 18.0 standard drinks per week. Frank Rosales reports that Frank Rosales does not use drugs.  Current Outpatient Medications on File Prior to Visit  Medication Sig Dispense Refill   acetaminophen (TYLENOL) 325 MG tablet Take 2 tablets (650 mg total) by mouth every 6 (six) hours as needed.     albuterol (PROAIR HFA) 108 (90 Base) MCG/ACT inhaler Inhale 2 puffs into the lungs every 6 (six) hours. 54 g 1   albuterol (PROVENTIL) (2.5 MG/3ML) 0.083% nebulizer solution Take 3 mLs (2.5 mg total) by nebulization every 4 (four) hours as needed for wheezing or shortness of breath. 240 mL 5   Ascorbic Acid (VITAMIN C) 100 MG tablet Take 100 mg by mouth daily.     aspirin 81 MG tablet Take 81 mg by mouth daily.      atorvastatin (LIPITOR) 40 MG tablet TAKE 1 TABLET BY MOUTH  DAILY FOR CHOLESTEROL 90 tablet 3   azithromycin (ZITHROMAX) 250 MG tablet Take 2 on day one then 1 daily x 4 days 6 tablet 0   Budeson-Glycopyrrol-Formoterol (BREZTRI AEROSPHERE) 160-9-4.8 MCG/ACT AERO Inhale 2 puffs into the lungs 2 (two) times daily. 32.1 g 11   cholecalciferol (VITAMIN D) 1000 UNITS tablet Take 1,000 Units by mouth daily.      dapagliflozin propanediol (FARXIGA) 10 MG TABS tablet Take 1 tablet (10 mg total) by mouth daily before breakfast. 90 tablet  5   dupilumab (DUPIXENT) 300 MG/2ML prefilled syringe Inject 300 mg into the skin every 14 (fourteen) days. Starting at day 15 for maintenance. 6 mL 2   Famotidine  (PEPCID PO) Take 1 tablet by mouth daily.     folic acid (FOLVITE) 1 MG tablet Take 1 tablet (1 mg total) by mouth daily. 90 tablet 3   levothyroxine (SYNTHROID) 50 MCG tablet TAKE 1 TABLET BY MOUTH IN  THE MORNING BEFORE  BREAKFAST WAIT 1 HOUR AFTER TAKING TO EAT OR DRINK 90 tablet 3   metFORMIN (GLUCOPHAGE-XR) 750 MG 24 hr tablet TAKE 2 TABLETS BY MOUTH  DAILY WITH BREAKFAST 180 tablet 0   NIFEdipine (ADALAT CC) 60 MG 24 hr tablet Take 1 tablet (60 mg total) by mouth 2 (two) times daily. 180 tablet 3   olmesartan-hydrochlorothiazide (BENICAR HCT) 40-25 MG tablet Take 1 tablet by mouth daily. 90 tablet 3   OneTouch Delica Lancets 26V MISC Apply topically.     ONETOUCH VERIO test strip SMARTSIG:Via Meter 1-4 Times Daily     pantoprazole (PROTONIX) 40 MG tablet Take 1 tablet (40 mg total) by mouth daily. Take 30-60 min before first meal of the day 30 tablet 2   triamcinolone cream (KENALOG) 0.1 %      vitamin B-12 (CYANOCOBALAMIN) 250 MCG tablet Take 250 mcg by mouth daily.     No current facility-administered medications on file prior to visit.    ROS Review of Systems  Constitutional:  Negative for fever.  Respiratory:  Negative for shortness of breath (doing well for two months, since steroid taper by Dr. Melvyn Novas).   Cardiovascular:  Negative for chest pain.  Musculoskeletal:  Negative for arthralgias.  Skin:  Negative for rash.   Objective:  BP 135/80    Pulse (!) 111    Temp 98.3 F (36.8 C)    Ht 5' 7"  (1.702 m)    Wt 204 lb 9.6 oz (92.8 kg)    SpO2 92%    BMI 32.04 kg/m   BP Readings from Last 3 Encounters:  06/01/21 135/80  04/03/21 (!) 142/82  03/01/21 (!) 150/73    Wt Readings from Last 3 Encounters:  06/01/21 204 lb 9.6 oz (92.8 kg)  04/03/21 203 lb 0.6 oz (92.1 kg)  03/01/21 204 lb 12.8 oz (92.9 kg)     Physical Exam Constitutional:      General: Frank Rosales is not in acute distress.    Appearance: Frank Rosales is well-developed.  HENT:     Head: Normocephalic and atraumatic.      Right Ear: External ear normal.     Left Ear: External ear normal.     Nose: Nose normal.  Eyes:     Conjunctiva/sclera: Conjunctivae normal.     Pupils: Pupils are equal, round, and reactive to light.  Cardiovascular:     Rate and Rhythm: Normal rate and regular rhythm.     Heart sounds: Normal heart sounds. No murmur heard. Pulmonary:     Effort: Pulmonary effort is normal. No respiratory distress.     Breath sounds: No wheezing, rhonchi or rales.     Comments: Breath sounds distant Abdominal:     Palpations: Abdomen is soft.     Tenderness: There is no abdominal tenderness.  Musculoskeletal:        General: Normal range of motion.     Cervical back: Normal range of motion and neck supple.  Skin:    General: Skin is warm and dry.  Neurological:  Mental Status: Frank Rosales is alert and oriented to person, place, and time.     Deep Tendon Reflexes: Reflexes are normal and symmetric.  Psychiatric:        Behavior: Behavior normal.        Thought Content: Thought content normal.        Judgment: Judgment normal.    Results for orders placed or performed in visit on 06/01/21  Bayer DCA Hb A1c Waived  Result Value Ref Range   HB A1C (BAYER DCA - WAIVED) 5.7 (H) 4.8 - 5.6 %        Assessment & Plan:   Frank Rosales was seen today for medical management of chronic issues.  Diagnoses and all orders for this visit:  Diabetes mellitus, new onset ([place]) -     Bayer DCA Hb A1c Waived  Hypothyroidism, unspecified type -     TSH + free T4  Essential hypertension -     CBC with Differential/Platelet -     CMP14+EGFR  Mixed hyperlipidemia -     Lipid panel  Screening for prostate cancer -     PSA, total and free   I am having Frank Rosales maintain his aspirin, cholecalciferol, vitamin H-88, folic acid, acetaminophen, triamcinolone cream, Famotidine (PEPCID PO), vitamin C, albuterol, NIFEdipine, Dupixent, OneTouch Verio, OneTouch Delica Lancets 87N, levothyroxine,  olmesartan-hydrochlorothiazide, metFORMIN, azithromycin, pantoprazole, albuterol, atorvastatin, Breztri Aerosphere, and dapagliflozin propanediol.  No orders of the defined types were placed in this encounter.    Follow-up: Return in about 6 months (around 11/29/2021).  Claretta Fraise, M.D.

## 2021-06-02 LAB — CMP14+EGFR
ALT: 30 IU/L (ref 0–44)
AST: 35 IU/L (ref 0–40)
Albumin/Globulin Ratio: 1.8 (ref 1.2–2.2)
Albumin: 4.9 g/dL — ABNORMAL HIGH (ref 3.8–4.8)
Alkaline Phosphatase: 81 IU/L (ref 44–121)
BUN/Creatinine Ratio: 14 (ref 10–24)
BUN: 14 mg/dL (ref 8–27)
Bilirubin Total: 0.8 mg/dL (ref 0.0–1.2)
CO2: 22 mmol/L (ref 20–29)
Calcium: 9.7 mg/dL (ref 8.6–10.2)
Chloride: 95 mmol/L — ABNORMAL LOW (ref 96–106)
Creatinine, Ser: 0.97 mg/dL (ref 0.76–1.27)
Globulin, Total: 2.8 g/dL (ref 1.5–4.5)
Glucose: 134 mg/dL — ABNORMAL HIGH (ref 70–99)
Potassium: 4.6 mmol/L (ref 3.5–5.2)
Sodium: 135 mmol/L (ref 134–144)
Total Protein: 7.7 g/dL (ref 6.0–8.5)
eGFR: 88 mL/min/{1.73_m2} (ref >59)

## 2021-06-02 LAB — CBC WITH DIFFERENTIAL/PLATELET
Basophils Absolute: 0.1 10*3/uL (ref 0.0–0.2)
Basos: 1 %
EOS (ABSOLUTE): 0.1 10*3/uL (ref 0.0–0.4)
Eos: 1 %
Hematocrit: 48.8 % (ref 37.5–51.0)
Hemoglobin: 18.2 g/dL — ABNORMAL HIGH (ref 13.0–17.7)
Immature Grans (Abs): 0 10*3/uL (ref 0.0–0.1)
Immature Granulocytes: 0 %
Lymphocytes Absolute: 1.4 10*3/uL (ref 0.7–3.1)
Lymphs: 17 %
MCH: 35.5 pg — ABNORMAL HIGH (ref 26.6–33.0)
MCHC: 37.3 g/dL — ABNORMAL HIGH (ref 31.5–35.7)
MCV: 95 fL (ref 79–97)
Monocytes Absolute: 0.9 10*3/uL (ref 0.1–0.9)
Monocytes: 11 %
Neutrophils Absolute: 5.7 10*3/uL (ref 1.4–7.0)
Neutrophils: 70 %
Platelets: 240 10*3/uL (ref 150–450)
RBC: 5.13 x10E6/uL (ref 4.14–5.80)
RDW: 12.7 % (ref 11.6–15.4)
WBC: 8.1 10*3/uL (ref 3.4–10.8)

## 2021-06-02 LAB — TSH+FREE T4
Free T4: 1.47 ng/dL (ref 0.82–1.77)
TSH: 3.32 u[IU]/mL (ref 0.450–4.500)

## 2021-06-02 LAB — PSA, TOTAL AND FREE
PSA, Free Pct: 30 %
PSA, Free: 0.33 ng/mL
Prostate Specific Ag, Serum: 1.1 ng/mL (ref 0.0–4.0)

## 2021-06-02 LAB — LIPID PANEL
Chol/HDL Ratio: 3 ratio (ref 0.0–5.0)
Cholesterol, Total: 262 mg/dL — ABNORMAL HIGH (ref 100–199)
HDL: 86 mg/dL (ref >39)
LDL Chol Calc (NIH): 155 mg/dL — ABNORMAL HIGH (ref 0–99)
Triglycerides: 120 mg/dL (ref 0–149)
VLDL Cholesterol Cal: 21 mg/dL (ref 5–40)

## 2021-06-11 ENCOUNTER — Other Ambulatory Visit: Payer: Self-pay | Admitting: Family Medicine

## 2021-06-13 DIAGNOSIS — E119 Type 2 diabetes mellitus without complications: Secondary | ICD-10-CM | POA: Diagnosis not present

## 2021-06-13 DIAGNOSIS — J449 Chronic obstructive pulmonary disease, unspecified: Secondary | ICD-10-CM | POA: Diagnosis not present

## 2021-06-28 ENCOUNTER — Other Ambulatory Visit: Payer: Self-pay | Admitting: Internal Medicine

## 2021-06-28 ENCOUNTER — Encounter: Payer: Self-pay | Admitting: *Deleted

## 2021-07-04 ENCOUNTER — Ambulatory Visit: Payer: Medicare Other | Admitting: Dermatology

## 2021-07-04 ENCOUNTER — Other Ambulatory Visit: Payer: Self-pay

## 2021-07-04 DIAGNOSIS — C44319 Basal cell carcinoma of skin of other parts of face: Secondary | ICD-10-CM

## 2021-07-04 DIAGNOSIS — Z1283 Encounter for screening for malignant neoplasm of skin: Secondary | ICD-10-CM

## 2021-07-04 DIAGNOSIS — C44219 Basal cell carcinoma of skin of left ear and external auricular canal: Secondary | ICD-10-CM | POA: Diagnosis not present

## 2021-07-04 DIAGNOSIS — L2084 Intrinsic (allergic) eczema: Secondary | ICD-10-CM

## 2021-07-04 DIAGNOSIS — D485 Neoplasm of uncertain behavior of skin: Secondary | ICD-10-CM

## 2021-07-04 MED ORDER — DUPIXENT 300 MG/2ML ~~LOC~~ SOAJ: 300.0000 mg | SUBCUTANEOUS | 6 refills | Status: DC

## 2021-07-04 NOTE — Patient Instructions (Signed)

## 2021-07-05 ENCOUNTER — Telehealth: Payer: Self-pay | Admitting: Dermatology

## 2021-07-05 ENCOUNTER — Telehealth: Payer: Self-pay

## 2021-07-05 DIAGNOSIS — L2084 Intrinsic (allergic) eczema: Secondary | ICD-10-CM

## 2021-07-05 MED ORDER — DUPIXENT 300 MG/2ML ~~LOC~~ SOAJ: SUBCUTANEOUS | 6 refills | Status: DC

## 2021-07-05 NOTE — Telephone Encounter (Signed)
Phone call from pharmacy needing prescription clarification. Clarified patients Dupixent 300 mg every 2 weeks.

## 2021-07-05 NOTE — Telephone Encounter (Signed)
[facility] sent via fax 90 day prescription needed  p [phone] Fax [phone]

## 2021-07-05 NOTE — Telephone Encounter (Signed)
Need Rx for dupixent clarified

## 2021-07-06 ENCOUNTER — Encounter: Payer: Self-pay | Admitting: Dermatology

## 2021-07-06 NOTE — Progress Notes (Signed)
° °  Follow-Up Visit   Subjective  Frank Rosales is a 66 y.o. male who presents for the following: Follow-up (Pt here to f/u on dupixent for eczema. Pt states that the areas cleared up about a month after starting ).  Follow-up eczema plus several spots to check Location:  Duration:  Quality:  Associated Signs/Symptoms: Modifying Factors:  Severity:  Timing: Context:   Objective  Well appearing patient in no apparent distress; mood and affect are within normal limits. Left Zygomatic Area Pearly 7 mm pink telangiectatic papule       Left Scaphoid Fossa Superficially eroded 6 mm flat crust       Left Lower Leg - Anterior, Right Lower Leg - Anterior Patient is pleased with improvement in visible rash and particularly itching.  Examination still shows some micropapular eczematous dermatitis on the upper torso but no treatment changes planned.  Did discuss both Adbry and new Jak inhibitors.    All skin waist up examined.   Assessment & Plan    Screening exam for skin cancer Upper Body  Neoplasm of uncertain behavior of skin (2) Left Zygomatic Area  Skin / nail biopsy Type of biopsy: tangential   Informed consent: discussed and consent obtained   Timeout: patient name, date of birth, surgical site, and procedure verified   Anesthesia: the lesion was anesthetized in a standard fashion   Anesthetic:  1% lidocaine w/ epinephrine 1-100,000 local infiltration Instrument used: flexible razor blade   Hemostasis achieved with: ferric subsulfate and electrodesiccation   Outcome: patient tolerated procedure well   Post-procedure details: wound care instructions given    Specimen 1 - Surgical pathology Differential Diagnosis: R/O BCC VS SCC  Check Margins: No  Left Scaphoid Fossa  Skin / nail biopsy Type of biopsy: tangential   Informed consent: discussed and consent obtained   Timeout: patient name, date of birth, surgical site, and procedure verified    Anesthesia: the lesion was anesthetized in a standard fashion   Anesthetic:  1% lidocaine w/ epinephrine 1-100,000 local infiltration Instrument used: flexible razor blade   Hemostasis achieved with: ferric subsulfate and electrodesiccation   Outcome: patient tolerated procedure well   Post-procedure details: wound care instructions given    Specimen 2 - Surgical pathology Differential Diagnosis: R/O BCC VS SCC  Check Margins: No  Intrinsic (allergic) eczema Left Lower Leg - Anterior; Right Lower Leg - Anterior  Continue Dupixent injections every 2 weeks.  Call me if there is any issues relating to this.  Related Medications Dupilumab (DUPIXENT) 300 MG/2ML SOPN Inject 300 mg into the skin every 14 (fourteen) days. Starting at day 15 for maintenance.      I, Lavonna Monarch, MD, have reviewed all documentation for this visit.  The documentation on 07/06/21 for the exam, diagnosis, procedures, and orders are all accurate and complete.

## 2021-07-10 ENCOUNTER — Telehealth: Payer: Self-pay | Admitting: *Deleted

## 2021-07-10 NOTE — Telephone Encounter (Signed)
-----   Message from Lavonna Monarch, MD sent at 07/06/2021  7:30 PM EST ----- Schedule routine surgery with Dr. Darene Lamer, I will likely do these 2 sessions.

## 2021-07-10 NOTE — Telephone Encounter (Signed)
Pathology results to patient- surgery appointment scheduled.  

## 2021-07-18 ENCOUNTER — Other Ambulatory Visit: Payer: Self-pay | Admitting: Family Medicine

## 2021-07-18 DIAGNOSIS — I10 Essential (primary) hypertension: Secondary | ICD-10-CM

## 2021-08-24 ENCOUNTER — Ambulatory Visit (INDEPENDENT_AMBULATORY_CARE_PROVIDER_SITE_OTHER): Payer: Medicare Other | Admitting: Dermatology

## 2021-08-24 ENCOUNTER — Encounter: Payer: Self-pay | Admitting: Dermatology

## 2021-08-24 DIAGNOSIS — C44219 Basal cell carcinoma of skin of left ear and external auricular canal: Secondary | ICD-10-CM

## 2021-08-24 NOTE — Patient Instructions (Signed)

## 2021-08-30 ENCOUNTER — Other Ambulatory Visit: Payer: Self-pay | Admitting: Family Medicine

## 2021-09-11 ENCOUNTER — Encounter: Payer: Self-pay | Admitting: Dermatology

## 2021-09-11 NOTE — Progress Notes (Signed)
? ?  Follow-Up Visit ?  ?Subjective  ?Frank Rosales is a 66 y.o. male who presents for the following: Procedure (Here for treatment on BCC x 2 left zygomatic area and left scaphoid fossa. ). ? ?Biopsy-proven nonmelanoma skin cancers left cheek and ear; will do here today. ?Location:  ?Duration:  ?Quality:  ?Associated Signs/Symptoms: ?Modifying Factors:  ?Severity:  ?Timing: ?Context:  ? ?Objective  ?Well appearing patient in no apparent distress; mood and affect are within normal limits. ?Left Scaphoid Fossa ?Lesion identified by Dr.Citlaly Camplin and nurse in room.   ? ? ? ?A focused examination was performed including the neck.. Relevant physical exam findings are noted in the Assessment and Plan. ? ? ?Assessment & Plan  ? ? ?Basal cell carcinoma (BCC) of skin of left ear ?Left Scaphoid Fossa ? ?Destruction of lesion ?Complexity: simple   ?Destruction method: electrodesiccation and curettage   ?Informed consent: discussed and consent obtained   ?Timeout:  patient name, date of birth, surgical site, and procedure verified ?Anesthesia: the lesion was anesthetized in a standard fashion   ?Anesthetic:  1% lidocaine w/ epinephrine 1-100,000 local infiltration ?Curettage performed in three different directions: Yes   ?Electrodesiccation performed over the curetted area: Yes   ?Curettage cycles:  3 ?Lesion length (cm):  1 ?Lesion width (cm):  1 ?Margin per side (cm):  0 ?Final wound size (cm):  1 ?Hemostasis achieved with:  ferric subsulfate and electrodesiccation ?Outcome: patient tolerated procedure well with no complications   ?Post-procedure details: sterile dressing applied and wound care instructions given   ?Dressing type: bandage and petrolatum   ?Additional details:  Wound innoculated with 5 fluorouracil solution. ? ? ? ? ? ?I, Lavonna Monarch, MD, have reviewed all documentation for this visit.  The documentation on 09/11/21 for the exam, diagnosis, procedures, and orders are all accurate and complete. ?

## 2021-09-22 ENCOUNTER — Telehealth: Payer: Self-pay | Admitting: Pharmacist

## 2021-09-22 DIAGNOSIS — J449 Chronic obstructive pulmonary disease, unspecified: Secondary | ICD-10-CM

## 2021-09-22 DIAGNOSIS — E119 Type 2 diabetes mellitus without complications: Secondary | ICD-10-CM

## 2021-09-22 MED ORDER — DAPAGLIFLOZIN PROPANEDIOL 10 MG PO TABS: 10.0000 mg | ORAL_TABLET | Freq: Every day | ORAL | 5 refills | Status: DC

## 2021-09-22 MED ORDER — BREZTRI AEROSPHERE 160-9-4.8 MCG/ACT IN AERO: 2.0000 | INHALATION_SPRAY | Freq: Two times a day (BID) | RESPIRATORY_TRACT | 11 refills | Status: DC

## 2021-09-22 NOTE — Telephone Encounter (Signed)
Refills sent to [facility] patient [facility] for Breztri/farxiga ?Escribed to [facility] ?

## 2021-09-28 ENCOUNTER — Ambulatory Visit (INDEPENDENT_AMBULATORY_CARE_PROVIDER_SITE_OTHER): Payer: Medicare Other | Admitting: Dermatology

## 2021-09-28 DIAGNOSIS — C44319 Basal cell carcinoma of skin of other parts of face: Secondary | ICD-10-CM | POA: Diagnosis not present

## 2021-09-28 NOTE — Patient Instructions (Signed)

## 2021-09-29 ENCOUNTER — Encounter: Payer: Self-pay | Admitting: Internal Medicine

## 2021-09-29 ENCOUNTER — Ambulatory Visit: Payer: Medicare Other | Admitting: Internal Medicine

## 2021-09-29 DIAGNOSIS — R058 Other specified cough: Secondary | ICD-10-CM | POA: Diagnosis not present

## 2021-09-29 DIAGNOSIS — J449 Chronic obstructive pulmonary disease, unspecified: Secondary | ICD-10-CM | POA: Diagnosis not present

## 2021-09-29 NOTE — Patient Instructions (Addendum)
My office will be contacting you by phone for referral to ENT for hoarseness   Please schedule a follow up visit in 6  months but call sooner if needed

## 2021-09-29 NOTE — Progress Notes (Unsigned)
Subjective:   Patient ID: Frank Rosales, male    DOB: [birth year]    MRN: [id number]    Brief patient profile:  19  yowm/MM "quit smoking"  11/2017  with h/o exposure to Asbestosis at [place] ? Into the 80s  still able to walk  flat fine but steps x 3 flights sob with GOLD III copd 2013 prev eval by Dr Joya Gaskins  / gets annual f/u in [place]     History of Present Illness  02/16/2016  f/u ov/Frank Rosales re:  GOLD III copd /  symbicort 160 2bid / twice weekly saba  Chief Complaint  Patient presents with   Follow-up    Pt states had CT Chest done 12/27/15- ordered by worker's comp for eval of previous asbestos exp. He states he had been doing well until July 2017 developed cough and congestion that lasted 2 months, but starting to improve.   sick July - September 2017 rx with abx/ steroids better since early Sept 2017 but CT was done in middle of this illness and was abnormal but was done for purpose of screening for asbestos  rec Plan A = Automatic = symbicort 160 Take 2 puffs first thing in am and then another 2 puffs about 12 hours later.  Work on inhaler technique:   Only use your albuterol(proair)  as a rescue medication  The key is to stop smoking completely before smoking completely stops you!       02/01/2020  f/u ov/Frank Rosales re: GOLD III copd maint on symbicort/ dupixent per derm Chief Complaint  Patient presents with   Follow-up    Patient feels like his breathing has got worse since last visit. Struggles to breath with any exertion. Cough in morning with clear sputum.  Dyspnea:  Variable doe/ worse in shower / also limited by back pain which is chronic  Walks to shop x 50 yards [place] coming back to house  Cough: some in am better p coffee  Sleeping: bed is flat/ two pillows  SABA use: once a week 02: none rec Try albuterol 15 min before an activity that you know would make you short of breath and see if it makes any difference and if makes none then don't take  it after activity unless you can't catch your breath.  Plan A = Automatic = Always=    Change to Breztri Take 2 puffs first thing in am and then another 2 puffs about 12 hours later.  Plan B = Backup (to supplement plan A, not to replace it) Only use your albuterol inhaler as a rescue medication Plan C = Crisis (instead of Plan B but only if Plan B stops working) - only use your albuterol nebulizer if you first try Plan B and it fails to help > ok to use the nebulizer up to every 4 hours but if start needing it regularly call for immediate appointment       09/16/2020  f/u ov/[place] office/Frank Rosales re:  Girtha Rm III / breztri/ dupixent per derm   Chief Complaint  Patient presents with   Follow-up    Productive cough with a little green phlegm and sometimes clear phlegm  Dyspnea:  Walks to shop x 50 yards s stopping  - not real change symb vs breztri per pt  Cough:slt green just in am chronically/ no recent change  Sleeping: flat bed/ 2 pillows  SABA use: minimal 02: none  Covid status: vax x 3  Lung cancer screening :  Feb 2022  [name] neg per pt ( asbestosis "mild" ) Rec Plan A = Automatic = Always=    breztri or symbicort 160  Take 2 puffs first thing in am and then another 2 puffs about 12 hours later.  Plan B = Backup (to supplement plan A, not to replace it) Only use your albuterol inhaler as a rescue medication Plan C = Crisis (instead of Plan B but only if Plan B stops working) - only use your albuterol nebulizer if you first try Plan B and it fails to help > ok to use the nebulizer up to every 4 hours but if start needing it regularly call for immediate appointment    04/03/2021  f/u ov/[place] office/Frank Rosales re: GOLD 3 maint on breztri / dupixent  and hs pepcid  Chief Complaint  Patient presents with   Follow-up    Coughing up green mucus, chest and throat are hurting. Increased in SOB. Has taken dayquil and mucinex since Tues. 03-28-21 after taking booster. No  fever. Neg. Covid test yesterday.    Dyspnea:  able to walk to shop "just barely" at baseline  Cough: worse than usual 03/28/21 when got bivalent vaccine / neg covid Ag 04/02/21 Sleeping: flat bed / 2pillows   SABA use: just using hfa at baseline, even when sick just hfa  02: none  Covid status: vax x 5  Rec Plan A = Automatic = Always=    Breztri Take 2 puffs first thing in am and then another 2 puffs about 12 hours later.   Plan B = Backup (to supplement plan A, not to replace it) Only use your albuterol inhaler as a rescue medication  Plan C = Crisis (instead of Plan B but only if Plan B stops working) - only use your albuterol nebulizer if you first try Plan B and it fails to help > ok to use the nebulizer up to every 4 hours but if start needing it regularly call for immediate appointment For flares of coughing: Immediately start protonix 40 mg Take 30-60 min before first meal of the day until cough is better for a week and no longer need max dose of mucinex dm 1200  mg every 12 hours  Zpak  Prednisone 10 mg take  4 each am x 2 days,   2 each am x 2 days,  1 each am x 2 days and stop  Depomedrol 80 mg IM today  Please schedule a follow up visit in 6 months but call sooner if needed       09/29/2021  f/u ov/[place] office/Frank Rosales re: GOLD 3  maint on breztri/dupixent and pepcid each evening   Chief Complaint  Patient presents with   Follow-up    Cough and chest congestion have cleared up   Dyspnea:  shop and back s stopping  Cough: better  Sleeping: ok flat bed / 2 pillows  SABA use: not needing  02: none  Covid status: vax x 5  Lung cancer screeing done annually in [place]      No obvious day to day or daytime variability or assoc excess/ purulent sputum or mucus plugs or hemoptysis or cp or chest tightness, subjective wheeze or overt sinus or hb symptoms.   Sleeping ok as above  without nocturnal  or early am exacerbation  of respiratory  c/o's or need for noct saba.  Also denies any obvious fluctuation of symptoms with weather or environmental changes or other aggravating or alleviating factors except  as outlined above   No unusual exposure hx or h/o childhood pna/ asthma or knowledge of premature birth.  Current Allergies, Complete Past Medical History, Past Surgical History, Family History, and Social History were reviewed in [employer] [employer] record.  ROS  The following are not active complaints unless bolded Hoarseness, sore throat, dysphagia, dental problems, itching, sneezing,  nasal congestion or discharge of excess mucus or purulent secretions, ear ache,   fever, chills, sweats, unintended wt loss or wt gain, classically pleuritic or exertional cp,  orthopnea pnd or arm/hand swelling  or leg swelling, presyncope, palpitations, abdominal pain, anorexia, nausea, vomiting, diarrhea  or change in bowel habits or change in bladder habits, change in stools or change in urine, dysuria, hematuria,  rash, arthralgias, visual complaints, headache, numbness, weakness or ataxia or problems with walking or coordination,  change in mood or  memory.        Current Meds  Medication Sig   albuterol (PROAIR HFA) 108 (90 Base) MCG/ACT inhaler Inhale 2 puffs into the lungs every 6 (six) hours.   albuterol (PROVENTIL) (2.5 MG/3ML) 0.083% nebulizer solution Take 3 mLs (2.5 mg total) by nebulization every 4 (four) hours as needed for wheezing or shortness of breath.   Ascorbic Acid (VITAMIN C) 100 MG tablet Take 100 mg by mouth daily.   atorvastatin (LIPITOR) 40 MG tablet TAKE 1 TABLET BY MOUTH  DAILY FOR CHOLESTEROL   Budeson-Glycopyrrol-Formoterol (BREZTRI AEROSPHERE) 160-9-4.8 MCG/ACT AERO Inhale 2 puffs into the lungs 2 (two) times daily.   cholecalciferol (VITAMIN D) 1000 UNITS tablet Take 1,000 Units by mouth daily.    dapagliflozin propanediol (FARXIGA) 10 MG TABS tablet Take 1 tablet (10 mg total) by mouth daily before breakfast.   dupilumab  (DUPIXENT) 300 MG/2ML prefilled syringe Inject 300 mg into the skin every 14 (fourteen) days. Starting at day 15 for maintenance.   Dupilumab ([place]) 300 MG/2ML SOPN Starting at day 15 for maintenance. 90 day supply   folic acid (FOLVITE) 1 MG tablet Take 1 tablet (1 mg total) by mouth daily.   levothyroxine (SYNTHROID) 50 MCG tablet TAKE 1 TABLET BY MOUTH IN  THE MORNING BEFORE  BREAKFAST WAIT 1 HOUR AFTER TAKING TO EAT OR DRINK   metFORMIN (GLUCOPHAGE-XR) 750 MG 24 hr tablet TAKE 2 TABLETS BY MOUTH DAILY  WITH BREAKFAST   NIFEdipine (ADALAT CC) 60 MG 24 hr tablet TAKE 1 TABLET BY MOUTH  TWICE DAILY   olmesartan-hydrochlorothiazide (BENICAR HCT) 40-25 MG tablet Take 1 tablet by mouth daily.   vitamin B-12 (CYANOCOBALAMIN) 250 MCG tablet Take 250 mcg by mouth daily.                        Objective:   Physical Exam   09/29/2021    212 04/03/2021  203  09/16/2020      222 03/03/2020  219  02/01/2020    217 10/05/2019    228 03/31/2019  213  01/24/2015    180 > 02/21/2015  180  > 06/16/2015  186  > 02/16/2016  205 > 09/13/2016   185 >  11/07/2016  187 >  12/21/2016  195 > 03/25/2017   200 > 06/25/2017   198  > 12/23/2017   199  > 06/13/2018  200      11/10/14 174 lb (78.926 kg)  11/08/14 176 lb 6.4 oz (80.015 kg)  10/27/14 175 lb (79.379 kg)    Vital signs reviewed  09/29/2021  - Note  at rest 02 sats  96% on RA    General appearance:    hoarse amb wm nad   HEENT :  Oropharynx  clear  Nasal turbintes nl    NECK :  without JVD/Nodes/TM/ nl carotid upstrokes bilaterally   LUNGS: no acc muscle use,  Mod barrel  contour chest wall with bilateral  Distant bs s audible wheeze and  without cough on insp or exp maneuvers and mod  Hyperresonant  to  percussion bilaterally     CV:  RRR  no s3 or murmur or increase in P2, and no edema   ABD:  soft and nontender with pos mid insp Hoover's  in the supine position. No bruits or organomegaly appreciated, bowel sounds nl  MS:   Ext warm without  deformities or   obvious joint restrictions , calf tenderness, cyanosis or clubbing  SKIN: warm and dry without lesions    NEURO:  alert, approp, nl sensorium with  no motor or cerebellar deficits apparent.         Ct's per Dr Pearlie Oyster in [place]  for yearly f/u asbestos     Assessment & Plan:

## 2021-09-30 ENCOUNTER — Encounter: Payer: Self-pay | Admitting: Internal Medicine

## 2021-09-30 NOTE — Assessment & Plan Note (Signed)
Quit smoking Spirometry 11/2017   03/2011 FeV1 49%    Spirometry  02/28/2012  FEV1 45%   - Last day worked = October 13 2014  - PFT's  01/24/2015  FEV1 1.59  (43 % ) ratio 54   p 22 % improvement from saba with DLCO  88 % corrects to 99 % for alv volume s symbicort  - PFTs  12/27/15 1.30 (35%) ratio 35  In midst of a flare- - PFT's  12/21/2016  FEV1 1.40 (39 % ) ratio 50  p 8 % improvement from saba p symbicort 160 x 2  prior to study with DLCO  71/69 % corrects to 85  % for alv volume - PFTS  05/16/17  FEV1  1.32( 37%)  Ratio 37  And DLCO  52% > corrects to 78%  03/25/2017  try bevespi > preferred symb 160  - 06/13/2018    try adding spiriva 2.5 x 2 each am sample  > no better so did not fill rx  - 03/31/2019   Walked RA x one lap =  approx 250 ft @ mod pace - stopped due to sob with sats of 92% at the end of the study. - 02/01/2020  After extensive coaching inhaler device,  effectiveness =  90%  - alpha one AT def screen  02/02/20   MM  Level 151  -  09/16/2020   Walked RA   Moderate pace walk total 300 feet feeling short of breath at 200 feet but sats still 98% at end typical of a pink puffer   Group D (now reclassified as E) in terms of symptom/risk and laba/lama/ICS  therefore appropriate rx at this point >>>  Continue breztri and approp saba

## 2021-09-30 NOTE — Assessment & Plan Note (Addendum)
Rec h2 hs 06/27/18 - Sinus CT 07/07/2018 >>> Clear sinuses - improved as of 11/13/2018 though on dupixent per derm for rash  - Assoc new daily hoarseness > rec ENT eval 09/29/2021          Each maintenance medication was reviewed in detail including emphasizing most importantly the difference between maintenance and prns and under what circumstances the prns are to be triggered using an action plan format where appropriate.  Total time for H and P, chart review, counseling, reviewing hfa device(s) and generating customized AVS unique to this office visit / same day charting = 25 min

## 2021-10-03 ENCOUNTER — Telehealth: Payer: Medicare Other

## 2021-10-04 ENCOUNTER — Ambulatory Visit (INDEPENDENT_AMBULATORY_CARE_PROVIDER_SITE_OTHER): Payer: Medicare Other

## 2021-10-04 DIAGNOSIS — Z4802 Encounter for removal of sutures: Secondary | ICD-10-CM

## 2021-10-04 NOTE — Progress Notes (Signed)
NTS Suture removal, No s/s of infection, patient's pathology results aren't back yet. Patient aware.

## 2021-10-10 ENCOUNTER — Telehealth: Payer: Self-pay

## 2021-10-10 NOTE — Telephone Encounter (Signed)
Phone call to patient with his pathology results and Dr. Onalee Hua recommendations. Patient states that he will think about going to [facility] for the [facility] procedure and give [place] a call back.

## 2021-10-10 NOTE — Telephone Encounter (Signed)
-----   Message from Lavonna Monarch, MD sent at 10/04/2021 11:17 PM EDT ----- Although the positive microscopic margin does not always mean that the lesion will recur, because he is young and this is on his face, Dr. Denna Haggard does recommend referral for Mohs surgery.

## 2021-10-18 ENCOUNTER — Ambulatory Visit (INDEPENDENT_AMBULATORY_CARE_PROVIDER_SITE_OTHER): Payer: Medicare Other | Admitting: Pharmacist

## 2021-10-18 DIAGNOSIS — E782 Mixed hyperlipidemia: Secondary | ICD-10-CM

## 2021-10-18 DIAGNOSIS — E119 Type 2 diabetes mellitus without complications: Secondary | ICD-10-CM

## 2021-10-18 DIAGNOSIS — J449 Chronic obstructive pulmonary disease, unspecified: Secondary | ICD-10-CM

## 2021-10-18 MED ORDER — DAPAGLIFLOZIN PROPANEDIOL 10 MG PO TABS: 10.0000 mg | ORAL_TABLET | Freq: Every day | ORAL | 5 refills | Status: DC

## 2021-10-18 MED ORDER — BREZTRI AEROSPHERE 160-9-4.8 MCG/ACT IN AERO: 2.0000 | INHALATION_SPRAY | Freq: Two times a day (BID) | RESPIRATORY_TRACT | 11 refills | Status: DC

## 2021-10-18 NOTE — Patient Instructions (Addendum)
Visit Information  Following are the goals we discussed today:  Current Barriers:  Unable to independently afford treatment regimen  Pharmacist Clinical Goal(s):  Over the next 90 days, patient will verbalize ability to afford treatment regimen maintain control of T2DM as evidenced by [place]  through collaboration with PharmD and provider.    Interventions: 1:1 collaboration with Frank Fraise, MD regarding development and update of comprehensive plan of care as evidenced by provider attestation and co-signature Inter-disciplinary care team collaboration (see longitudinal plan of care) Comprehensive medication review performed; medication list updated in electronic medical record  Diabetes: Ccontrolled; current treatment: FARXIGA '10MG'$ , METFORMIN;  A1C 5.7% , GFR 88 Providing access to care for Frank Rosales Re-enrolled for 2023 farxiga via [place] Will ship to patient's home/3 month supply  Refills sent to program for the year [employer] escribe Current glucose readings: fasting glucose: 100-120, post prandial glucose: 140-160 Denies hypoglycemic/hyperglycemic symptoms Discussed meal planning options and Plate method for healthy eating Avoid sugary drinks and desserts Incorporate balanced protein, non starchy veggies, 1 serving of carbohydrate with each meal Increase water intake Increase physical activity as able Current exercise: N/A Educated on [place], [place] patient finances. Patient approved for [place] via [employer] PATIENT ASSISTANCE--medication to ship to patient's home in 7-10 business days   Chronic Obstructive Pulmonary Disease: Uncontrolled/controlled; current treatment:BREZTRI; albuterol rescue, dupixent GOLD Classification: III (sees Dr. Melvyn Rosales for pulm) Most recent Pulmonary Function Testing: TLC  Date Value Ref Range Status  12/21/2016 6.47 L Final   0 exacerbations requiring treatment in the last  6 months  Assessed patient finances. Patient approved for farxiga & breztri  [place] PATIENT ASSISTANCE FOR BREZTRI   Patient Goals/Self-Care Activities Over the next 90 days, patient will:  - take medications as prescribed  Follow Up Plan: Telephone follow up appointment with care management team member scheduled for: 6 months   Plan: Telephone follow up appointment with care management team member scheduled for:  3 months  Signature Frank Rosales, PharmD, BCPS Clinical Pharmacist, [place]  II Phone [phone]   Please call the care guide team at [phone] if you need to cancel or reschedule your appointment.   The patient verbalized understanding of instructions, educational materials, and care plan provided today and DECLINED offer to receive copy of patient instructions, educational materials, and care plan.

## 2021-10-18 NOTE — Progress Notes (Signed)
Chronic Care Management Pharmacy Note  10/18/2021 Name:  Frank Rosales MRN:  [id number] DOB:  04/04/1956  Summary:  Diabetes: Ccontrolled; current treatment: FARXIGA 10MG, METFORMIN;  A1C 5.7% , GFR 88 Providing access to care for [place] Re-enrolled for 2023 farxiga via AZ&ME PATIENT ASSISTANCE/new application sent today Will ship to patient's home/3 month supply  Refills sent to program for the year [employer] escribe Current glucose readings: fasting glucose: 100-120, post prandial glucose: 140-160 Denies hypoglycemic/hyperglycemic symptoms Discussed meal planning options and Plate method for healthy eating Avoid sugary drinks and desserts Incorporate balanced protein, non starchy veggies, 1 serving of carbohydrate with each meal Increase water intake Increase physical activity as able Current exercise: N/A Educated on [place], [place] patient finances. Patient approved for [place] via AZ&ME PATIENT ASSISTANCE--medication to ship to patient's home in 7-10 business days   Chronic Obstructive Pulmonary Disease: Uncontrolled/controlled; current treatment:BREZTRI; albuterol rescue, dupixent GOLD Classification: III (sees Dr. Melvyn Novas for pulm) Most recent Pulmonary Function Testing: TLC  Date Value Ref Range Status  12/21/2016 6.47 L Final   0 exacerbations requiring treatment in the last 6 months  Assessed patient finances. Patient approved for farxiga & breztri  [employer]&[place] PATIENT ASSISTANCE FOR BREZTRI   Subjective: Frank Rosales is an 66 y.o. year old male who is a primary patient of Stacks, Cletus Gash, MD.  The CCM team was consulted for assistance with disease management and care coordination needs.    Collaboration with az&me PAP  for follow up visit in response to provider referral for pharmacy case management and/or care coordination services.   Consent to Services:  The patient was given information about Chronic Care Management  services, agreed to services, and gave verbal consent prior to initiation of services.  Please see initial visit note for detailed documentation.   Patient Care Team: Claretta Fraise, MD as PCP - General (Family Medicine) Tanda Rockers, MD as Consulting Physician (Pulmonary Disease) Lavonna Monarch, MD as Consulting Physician (Dermatology) [facility] Lavera Guise, [facility] as Pharmacist (Family Medicine)  Objective:  Lab Results  Component Value Date   CREATININE 0.97 06/01/2021   CREATININE 0.89 03/01/2021   CREATININE 1.03 11/29/2020    Lab Results  Component Value Date   HGBA1C 5.7 (H) 06/01/2021   Last diabetic Eye exam: No results found for: HMDIABEYEEXA  Last diabetic Foot exam: No results found for: HMDIABFOOTEX      Component Value Date/Time   CHOL 262 (H) 06/01/2021 1045   TRIG 120 06/01/2021 1045   TRIG 99 12/03/2013 0858   HDL 86 06/01/2021 1045   HDL 75 12/03/2013 0858   CHOLHDL 3.0 06/01/2021 1045   LDLCALC 155 (H) 06/01/2021 1045   LDLCALC 125 (H) 12/03/2013 0858       Latest Ref Rng & Units 06/01/2021   10:45 AM 03/01/2021    9:17 AM 11/29/2020   11:21 AM  Hepatic Function  Total Protein 6.0 - 8.5 g/dL 7.7   7.7   7.4    Albumin 3.8 - 4.8 g/dL 4.9   4.7   4.9    AST 0 - 40 IU/L 35   28   41    ALT 0 - 44 IU/L 30   22   40    Alk Phosphatase 44 - 121 IU/L 81   96   91    Total Bilirubin 0.0 - 1.2 mg/dL 0.8   0.6   0.8  Lab Results  Component Value Date/Time   TSH 3.320 06/01/2021 10:45 AM   TSH 2.480 03/01/2021 09:17 AM   FREET4 1.47 06/01/2021 10:45 AM   FREET4 1.41 03/01/2021 09:17 AM       Latest Ref Rng & Units 06/01/2021   10:45 AM 03/01/2021    9:17 AM 11/29/2020   11:21 AM  CBC  WBC 3.4 - 10.8 x10E3/uL 8.1   9.5   9.4    Hemoglobin 13.0 - 17.7 g/dL 18.2   18.0   17.2    Hematocrit 37.5 - 51.0 % 48.8   50.3   49.1    Platelets 150 - 450 x10E3/uL 240   240   242      Lab Results  Component Value Date/Time    VD25OH 38.3 12/27/2017 10:10 AM   VD25OH 37.0 02/03/2013 08:04 AM    Clinical ASCVD: No  The 10-year ASCVD risk score (Arnett DK, et al., 2019) is: 24.6%   Values used to calculate the score:     Age: 66 years     Sex: Male     Is Non-Hispanic African American: No     Diabetic: Yes     Tobacco smoker: No     Systolic Blood Pressure: 154 mmHg     Is BP treated: Yes     HDL Cholesterol: 86 mg/dL     Total Cholesterol: 262 mg/dL    Other: (CHADS2VASc if Afib, PHQ9 if depression, MMRC or CAT for COPD, ACT, DEXA)  Social History   Tobacco Use  Smoking Status Former   Packs/day: 1.50   Years: 45.00   Pack years: 67.50   Types: Cigarettes   Quit date: 11/11/2017   Years since quitting: 3.9  Smokeless Tobacco Never  Tobacco Comments        BP Readings from Last 3 Encounters:  09/29/21 (!) 144/86  06/01/21 135/80  04/03/21 (!) 142/82   Pulse Readings from Last 3 Encounters:  09/29/21 100  06/01/21 (!) 111  04/03/21 (!) 112   Wt Readings from Last 3 Encounters:  09/29/21 212 lb (96.2 kg)  06/01/21 204 lb 9.6 oz (92.8 kg)  04/03/21 203 lb 0.6 oz (92.1 kg)    Assessment: Review of patient past medical history, allergies, medications, health status, including review of consultants reports, laboratory and other test data, was performed as part of comprehensive evaluation and provision of chronic care management services.   SDOH:  (Social Determinants of Health) assessments and interventions performed:    CCM Care Plan  No Known Allergies  Medications Reviewed Today     Reviewed by Lavera Guise, [facility] (Pharmacist) on 10/18/21 at 49  Med List Status: <None>   Medication Order Taking? Sig Documenting Provider Last Dose Status Informant  albuterol (PROAIR HFA) 108 (90 Base) MCG/ACT inhaler [id number] No Inhale 2 puffs into the lungs every 6 (six) hours. Claretta Fraise, MD Taking Active   albuterol (PROVENTIL) (2.5 MG/3ML) 0.083% nebulizer solution [id number] No Take 3 mLs  (2.5 mg total) by nebulization every 4 (four) hours as needed for wheezing or shortness of breath. Tanda Rockers, MD Taking Active   Ascorbic Acid (VITAMIN C) 100 MG tablet [id number] No Take 100 mg by mouth daily. [provider] Taking Active   atorvastatin (LIPITOR) 40 MG tablet [id number] No TAKE 1 TABLET BY MOUTH  DAILY FOR CHOLESTEROL Stacks, Cletus Gash, MD Taking Active   Budeson-Glycopyrrol-Formoterol (BREZTRI AEROSPHERE) 160-9-4.8 MCG/ACT AERO [id number] No Inhale 2 puffs into the lungs 2 (  two) times daily. Claretta Fraise, MD Taking Active   cholecalciferol (VITAMIN D) 1000 UNITS tablet [id number] No Take 1,000 Units by mouth daily.  [provider] Taking Active Self  dapagliflozin propanediol (FARXIGA) 10 MG TABS tablet [id number] No Take 1 tablet (10 mg total) by mouth daily before breakfast. Claretta Fraise, MD Taking Active   dupilumab (DUPIXENT) 300 MG/2ML prefilled syringe [id number] No Inject 300 mg into the skin every 14 (fourteen) days. Starting at day 15 for maintenance. Lavonna Monarch, MD Taking Active   Dupilumab ([place]) 300 MG/2ML SOPN [id number] No Starting at day 15 for maintenance. 90 day supply Lavonna Monarch, MD Taking Active   Famotidine (PEPCID PO) [id number] No Take 1 tablet by mouth daily. [provider] Taking Active   folic acid (FOLVITE) 1 MG tablet [id number] No Take 1 tablet (1 mg total) by mouth daily. Lysbeth Penner, FNP Taking Active Self  levothyroxine (SYNTHROID) 50 MCG tablet [id number] No TAKE 1 TABLET BY MOUTH IN  THE MORNING BEFORE  BREAKFAST WAIT 1 HOUR AFTER TAKING TO EAT OR Onnie Graham, MD Taking Active   metFORMIN (GLUCOPHAGE-XR) 750 MG 24 hr tablet [id number] No TAKE 2 TABLETS BY MOUTH DAILY  WITH Gara Kroner, MD Taking Active   NIFEdipine (ADALAT CC) 60 MG 24 hr tablet [id number] No TAKE 1 TABLET BY MOUTH  TWICE DAILY Stacks, Cletus Gash, MD Taking Active   olmesartan-hydrochlorothiazide (BENICAR HCT) 40-25 MG tablet  [id number] No Take 1 tablet by mouth daily. Claretta Fraise, MD Taking Active   vitamin B-12 (CYANOCOBALAMIN) 250 MCG tablet [id number] No Take 250 mcg by mouth daily. [provider] Taking Active Self            Patient Active Problem List   Diagnosis Date Noted   COPD with acute exacerbation ([place]) 04/03/2021   Diabetes mellitus, new onset ([place]) 09/04/2020   Mixed hyperlipidemia 02/02/2020   Eczema 03/31/2019   Polycythemia, secondary 06/04/2018   Macrocytosis 12/27/2017   Upper airway cough syndrome 12/24/2017   Neuropathy 10/23/2016   Hemothorax on left 08/21/2016   Rib fractures 08/05/2016   OSA (obstructive sleep apnea) 05/02/2016   Asthma 04/13/2016   Asbestosis ([place]) 02/16/2016   Sleep disturbance 07/06/2015   Alcohol abuse 10/19/2014   GAD (generalized anxiety disorder) 07/01/2014   Hypothyroidism 12/03/2013   Osteopenia 03/11/2013   History of colonic polyps 07/22/2012   Cigarette smoker 07/12/2011   Essential hypertension    COPD GOLD III     High cholesterol    Hilar adenopathy     Immunization History  Administered Date(s) Administered   Influenza Split 02/12/2012, 02/11/2013   Influenza Whole 03/12/2011   Influenza,inj,Quad PF,6+ Mos 02/21/2015, 03/07/2016, 03/06/2017, 02/20/2018, 03/10/2019, 02/02/2020, 03/01/2021   Moderna Covid-19 Vaccine Bivalent Booster 65yr & up 03/28/2021   Moderna SARS-COV2 Booster Vaccination 04/19/2020, 10/18/2020   Moderna Sars-Covid-2 Vaccination 08/05/2019, 08/31/2019   Pneumococcal Conjugate-13 02/21/2015   Pneumococcal Polysaccharide-23 04/14/2013   Td 05/14/2002   Tdap 02/06/2013    Conditions to be addressed/monitored: COPD and DMII  Care Plan : PHARMD MEDICATION MANAGEMENT  Updates made by PLavera Guise [place] 10/18/2021 12:00 AM     Problem: DISEASE PROGRESSION PREVENTION      Long-Range Goal: T2DM,COPD   Recent Progress: On track  Priority: High  Note:   Current Barriers:  Unable to  independently afford treatment regimen  Pharmacist Clinical Goal(s):  Over the next 90 days, patient will verbalize ability to afford treatment regimen maintain control of T2DM  as evidenced by [place]  through collaboration with PharmD and provider.    Interventions: 1:1 collaboration with Claretta Fraise, MD regarding development and update of comprehensive plan of care as evidenced by provider attestation and co-signature Inter-disciplinary care team collaboration (see longitudinal plan of care) Comprehensive medication review performed; medication list updated in electronic medical record  Diabetes: Ccontrolled; current treatment: FARXIGA 10MG, METFORMIN;  A1C 5.7% , GFR 88 Providing access to care for [place] Re-enrolled for 2023 farxiga via [place] Will ship to patient's home/3 month supply  Refills sent to program for the year [employer] escribe Current glucose readings: fasting glucose: 100-120, post prandial glucose: 140-160 Denies hypoglycemic/hyperglycemic symptoms Discussed meal planning options and Plate method for healthy eating Avoid sugary drinks and desserts Incorporate balanced protein, non starchy veggies, 1 serving of carbohydrate with each meal Increase water intake Increase physical activity as able Current exercise: N/A Educated on [place], [place] patient finances. Patient approved for [place] via AZ&ME PATIENT ASSISTANCE--medication to ship to patient's home in 7-10 business days   Chronic Obstructive Pulmonary Disease: Uncontrolled/controlled; current treatment:BREZTRI; albuterol rescue, dupixent GOLD Classification: III (sees Dr. Melvyn Novas for pulm) Most recent Pulmonary Function Testing: TLC  Date Value Ref Range Status  12/21/2016 6.47 L Final  0 exacerbations requiring treatment in the last 6 months  Assessed patient finances. Patient approved for farxiga & breztri  AZ&ME PATIENT  ASSISTANCE FOR BREZTRI   Patient Goals/Self-Care Activities Over the next 90 days, patient will:  - take medications as prescribed  Follow Up Plan: Telephone follow up appointment with care management team member scheduled for: 6 months      Medication Assistance:  breztri/farxiga obtained through az&me medication assistance program.  Enrollment ends 05/13/22   Regina Eck, PharmD, BCPS Clinical Pharmacist, [place]  II Phone [phone]

## 2021-10-22 ENCOUNTER — Encounter: Payer: Self-pay | Admitting: Dermatology

## 2021-10-22 NOTE — Progress Notes (Signed)
Follow-Up Visit   Subjective  Frank Rosales is a 66 y.o. male who presents for the following: Procedure (Here for [facility] right zygomatic area.).  BCC left zygoma Location:  Duration:  Quality:  Associated Signs/Symptoms: Modifying Factors:  Severity:  Timing: Context:   Objective  Well appearing patient in no apparent distress; mood and affect are within normal limits. Left Zygomatic Area Biopsy site identified by nurse and me.    A focused examination was performed including head and neck. Relevant physical exam findings are noted in the Assessment and Plan.   Assessment & Plan    Basal cell carcinoma (BCC) of skin of other part of face Left Zygomatic Area  Destruction of lesion Complexity: extensive   Destruction method: electrodesiccation and curettage   Informed consent: discussed and consent obtained   Timeout:  patient name, date of birth, surgical site, and procedure verified Procedure prep:  Patient was prepped and draped in usual sterile fashion Prep type:  Isopropyl alcohol Anesthesia: the lesion was anesthetized in a standard fashion   Anesthetic:  1% lidocaine w/ epinephrine 1-100,000 buffered w/ 8.4% NaHCO3 Curettage performed in three different directions: Yes   Electrodesiccation performed over the curetted area: Yes   Curettage cycles:  3 Lesion length (cm):  1.2 Lesion width (cm):  1.2 Margin per side (cm):  0.1 Final wound size (cm):  1.4 Hemostasis achieved with:  pressure and aluminum chloride Outcome: patient tolerated procedure well with no complications   Post-procedure details: sterile dressing applied and wound care instructions given   Dressing type: bandage and petrolatum   Additional details:  Wound inoculated with fluorouracil solution  Skin excision  Lesion length (cm):  1.2 Lesion width (cm):  1.2 Margin per side (cm):  0.1 Total excision diameter (cm):  1.4 Informed consent: discussed and consent obtained   Timeout:  patient name, date of birth, surgical site, and procedure verified   Procedure prep:  Patient was prepped and draped in usual sterile fashion Prep type:  Isopropyl alcohol and povidone-iodine Anesthesia: the lesion was anesthetized in a standard fashion   Anesthetic:  1% lidocaine w/ epinephrine 1-100,000 buffered w/ 8.4% NaHCO3 Instrument used: #15 blade   Hemostasis achieved with: pressure   Hemostasis achieved with comment:  Electrocautery Outcome: patient tolerated procedure well with no complications   Post-procedure details: sterile dressing applied and wound care instructions given   Dressing type: bandage and pressure dressing (mupirocin)    Skin repair Complexity:  Intermediate Final length (cm):  1.8 Informed consent: discussed and consent obtained   Reason for type of repair: reduce tension to allow closure, reduce the risk of dehiscence, infection, and necrosis, reduce subcutaneous dead space and avoid a hematoma, allow closure of the large defect, preserve normal anatomy, preserve normal anatomical and functional relationships and enhance both functionality and cosmetic results   Undermining: edges undermined   Subcutaneous layers (deep stitches):  Suture size:  5-0 Suture type: Vicryl (polyglactin 910)   Subcutaneous suture technique: inverted dermal. Fine/surface layer approximation (top stitches):  Suture size:  5-0 Suture type: nylon   Suture type comment:  Nylon Stitches: simple running   Hemostasis achieved with: suture and pressure Hemostasis achieved with comment:  Electrocautery Outcome: patient tolerated procedure well with no complications   Post-procedure details: sterile dressing applied and wound care instructions given   Dressing type: bandage and pressure dressing (mupirocin)    Specimen 1 - Surgical pathology Differential Diagnosis: BCC Check Margins: Yes [id number] Anterior margin  Curettage  showed this to be a moderately deep lesion, after  triple curettage with cautery of base and edges, narrow margin excision performed followed by layered closure.      I, Lavonna Monarch, MD, have reviewed all documentation for this visit.  The documentation on 10/22/21 for the exam, diagnosis, procedures, and orders are all accurate and complete.

## 2021-10-23 ENCOUNTER — Other Ambulatory Visit: Payer: Self-pay | Admitting: Family Medicine

## 2021-10-25 ENCOUNTER — Other Ambulatory Visit: Payer: Self-pay | Admitting: Family Medicine

## 2021-10-28 ENCOUNTER — Other Ambulatory Visit: Payer: Self-pay | Admitting: Family Medicine

## 2021-10-28 DIAGNOSIS — I10 Essential (primary) hypertension: Secondary | ICD-10-CM

## 2021-10-28 DIAGNOSIS — E039 Hypothyroidism, unspecified: Secondary | ICD-10-CM

## 2021-11-10 DIAGNOSIS — Z7984 Long term (current) use of oral hypoglycemic drugs: Secondary | ICD-10-CM | POA: Diagnosis not present

## 2021-11-10 DIAGNOSIS — E1159 Type 2 diabetes mellitus with other circulatory complications: Secondary | ICD-10-CM | POA: Diagnosis not present

## 2021-11-10 DIAGNOSIS — J449 Chronic obstructive pulmonary disease, unspecified: Secondary | ICD-10-CM | POA: Diagnosis not present

## 2021-11-13 ENCOUNTER — Telehealth: Payer: Self-pay | Admitting: Internal Medicine

## 2021-11-13 NOTE — Telephone Encounter (Signed)
Called patient. He is frustrated that he has not heard from ENT office yet and wanted to know when they would be calling him. I did advise that they are a separate office and we are unsure of exact time frame of when appt would be made or when someone would call. I gave patient the ENT number [facility] [phone] and advised him to call and ask if he could make an appt. He states he is hesitant to get scheduled to be seen by ENT and wanted to know what exactly he needs to see them for. I did explain that on his last ov note Dr. Melvyn Novas rec. Going to see ENT for hoarseness and patient states he does recall talking about that with Dr. Melvyn Novas. He states he will call and try to schedule an appt but wants Dr. Melvyn Novas to know they have not called him yet.   Dr. Melvyn Novas routing to you as an FYI. Nothing further is needed.

## 2021-11-16 ENCOUNTER — Telehealth: Payer: Self-pay | Admitting: Dermatology

## 2021-11-16 NOTE — Telephone Encounter (Signed)
Patient left message on office voice mail that he would like to speak with someone before he schedules Moh's surgery.  Patient stated that he wants to make sure that it is absolutely necessary.

## 2021-11-16 NOTE — Telephone Encounter (Signed)
Left message to call office

## 2021-11-20 NOTE — Telephone Encounter (Signed)
Phone call to patient to speak with him about MOH's. Patient wanted to know if he really had to go have the [facility] procedure done? Patient states that he really doesn't want to go have the [facility] procedure done at this time, patient states that he will call the office if he notices the lesion recurring. I informed patient that it would be up to him if he's wanting to go to [facility] to have the procedure done. Patient aware.

## 2021-11-20 NOTE — Telephone Encounter (Signed)
Patient called back requesting a nurse to call him about possible Mohs appt.

## 2021-11-28 ENCOUNTER — Telehealth: Payer: Medicare Other

## 2021-11-29 ENCOUNTER — Other Ambulatory Visit: Payer: Self-pay | Admitting: Family Medicine

## 2021-11-29 ENCOUNTER — Ambulatory Visit (INDEPENDENT_AMBULATORY_CARE_PROVIDER_SITE_OTHER): Payer: Medicare Other

## 2021-11-29 VITALS — Wt 215.0 lb

## 2021-11-29 DIAGNOSIS — Z0000 Encounter for general adult medical examination without abnormal findings: Secondary | ICD-10-CM

## 2021-11-29 NOTE — Progress Notes (Signed)
Subjective:   Frank Rosales is a 66 y.o. male who presents for Medicare Annual/Subsequent preventive examination.  Virtual Visit via Telephone Note  I connected with  Frank Rosales on 11/29/21 at  9:45 AM Rosales by telephone and verified that I am speaking with the correct person using two identifiers.  Location: Patient: Home Provider: [facility] Persons participating in the virtual visit: patient/Nurse Health Advisor   I discussed the limitations, risks, security and privacy concerns of performing an evaluation and management service by telephone and the availability of in person appointments. The patient expressed understanding and agreed to proceed.  Interactive audio and video telecommunications were attempted between this nurse and patient, however failed, due to patient having technical difficulties OR patient did not have access to video capability.  We continued and completed visit with audio only.  Some vital signs may be absent or patient reported.   Frank Heigl E Marjani Kobel, LPN   Review of Systems     Cardiac Risk Factors include: advanced age (>68mn, >>59women);diabetes mellitus;dyslipidemia;hypertension;male gender;sedentary lifestyle;obesity (BMI >30kg/m2);smoking/ tobacco exposure;Other (see comment), Risk factor comments: COPD, OSA - no CPAP, asbestosis, alcoholism     Objective:    Today's Vitals   11/29/21 0943  Weight: 215 lb (97.5 kg)   Body mass index is 31.75 kg/m.     11/29/2021    9:55 AM 11/28/2020   11:31 AM 07/21/2019    9:39 AM 07/18/2018   10:39 AM 06/24/2018    9:47 AM 06/04/2018    1:00 PM 08/20/2016    8:32 PM  Advanced Directives  Does Patient Have a Medical Advance Directive? Yes Yes Yes No No No No  Type of [place]aramed[place]of [place] will [place] will Living will;Healthcare Power of Attorney      Does patient want to make changes to medical advance directive?   No - Patient declined      Copy of  [place] Chart? No - copy requested No - copy requested       Would patient like information on creating a medical advance directive?    Yes (MAU/Ambulatory/Procedural Areas - Information given)  No - Patient declined No - Patient declined    Current Medications (verified) Outpatient Encounter Medications as of 11/29/2021  Medication Sig   Ascorbic Acid (VITAMIN C) 100 MG tablet Take 100 mg by mouth daily.   atorvastatin (LIPITOR) 40 MG tablet TAKE 1 TABLET BY MOUTH  DAILY FOR CHOLESTEROL   Budeson-Glycopyrrol-Formoterol (BREZTRI AEROSPHERE) 160-9-4.8 MCG/ACT AERO Inhale 2 puffs into the lungs 2 (two) times daily.   cholecalciferol (VITAMIN D) 1000 UNITS tablet Take 1,000 Units by mouth daily.    dapagliflozin propanediol (FARXIGA) 10 MG TABS tablet Take 1 tablet (10 mg total) by mouth daily before breakfast.   diphenhydramine-acetaminophen (TYLENOL PM) 25-500 MG TABS tablet Take 1 tablet by mouth at bedtime as needed.   dupilumab (DUPIXENT) 300 MG/2ML prefilled syringe Inject 300 mg into the skin every 14 (fourteen) days. Starting at day 15 for maintenance.   Famotidine (PEPCID PO) Take 1 tablet by mouth daily.   folic acid (FOLVITE) 1 MG tablet Take 1 tablet (1 mg total) by mouth daily.   guaiFENesin (MUCINEX) 600 MG 12 hr tablet Take by mouth 2 (two) times daily as needed.   levothyroxine (SYNTHROID) 50 MCG tablet TAKE 1 TABLET BY MOUTH IN  THE MORNING BEFORE  BREAKFAST WAIT 1 HOUR AFTER TAKING TO EAT OR DRINK  metFORMIN (GLUCOPHAGE-XR) 750 MG 24 hr tablet TAKE 2 TABLETS BY MOUTH DAILY  WITH BREAKFAST   NIFEdipine (ADALAT CC) 60 MG 24 hr tablet TAKE 1 TABLET BY MOUTH TWICE  DAILY   olmesartan-hydrochlorothiazide (BENICAR HCT) 40-25 MG tablet TAKE 1 TABLET BY MOUTH  DAILY   vitamin B-12 (CYANOCOBALAMIN) 250 MCG tablet Take 250 mcg by mouth daily.   albuterol (PROAIR HFA) 108 (90 Base) MCG/ACT inhaler Inhale 2 puffs into the lungs every 6 (six) hours. (Patient not  taking: Reported on 11/29/2021)   albuterol (PROVENTIL) (2.5 MG/3ML) 0.083% nebulizer solution Take 3 mLs (2.5 mg total) by nebulization every 4 (four) hours as needed for wheezing or shortness of breath. (Patient not taking: Reported on 11/29/2021)   [DISCONTINUED] Dupilumab ([place]) 300 MG/2ML SOPN Starting at day 15 for maintenance. 90 day supply   No facility-administered encounter medications on file as of 11/29/2021.    Allergies (verified) Patient has no known allergies.   History: Past Medical History:  Diagnosis Date   Anxiety    Arthritis    "neck; lower back; right hip; right knee" (10/18/2014)   Asthma    Atypical nevus 03/27/2011   Mid Back-Severe (Exc)   Atypical nevus 01/20/2007   Right Flank[facility]ed [facility])   Colon polyps    COPD (chronic obstructive pulmonary disease) (HCC)    Diabetes mellitus without complication (HCC)    High blood pressure    High cholesterol    Hilar adenopathy    Hypothyroidism    Multiple rib fractures 08/2016   left side   Nodular basal cell carcinoma (BCC) 02/18/2020   Left Temporal Scalp   Nodular basal cell carcinoma (BCC) 07/04/2021   Left Zygomatic Area   Nodular basal cell carcinoma (BCC) 07/04/2021   Left Scaphoid Fossa (curet and 5FU)   SCCA (squamous cell carcinoma) of skin 02/18/2020   Left Nasal Sidewall (well diff)   SCCA (squamous cell carcinoma) of skin 02/18/2020   Right Temple (well diff)   SCCA (squamous cell carcinoma) of skin 02/18/2020   Left Temple (well diff)   Squamous cell carcinoma in situ (SCCIS) 01/09/2016   Right Low Lip Med (Cx3,5FU,LN2) and Top Scalp Sup (Cx3,5FU)   Superficial nodular basal cell carcinoma (BCC) 01/09/2016   Left Sideburn(Exc)   Past Surgical History:  Procedure Laterality Date   BACK SURGERY     epidural injections     FIXATION KYPHOPLASTY LUMBAR SPINE     "L1"   KNEE ARTHROSCOPY Right 05/14/2010   PLEURAL EFFUSION DRAINAGE Left 08/22/2016   Procedure: DRAINAGE OF  HEMOTHORAX;  Surgeon: [name] Nakayama, MD;  Location: [facility] OR;  Service: Thoracic;  Laterality: Left;   RIB PLATING Left 08/22/2016   Procedure: RIB PLATING;  Surgeon: [name] Nakayama, MD;  Location: [facility] OR;  Service: Thoracic;  Laterality: Left;   VIDEO ASSISTED THORACOSCOPY Left 08/22/2016   Procedure: VIDEO ASSISTED THORACOSCOPY;  Surgeon: [name] Nakayama, MD;  Location: [facility] OR;  Service: Thoracic;  Laterality: Left;   WISDOM TOOTH EXTRACTION     Family History  Problem Relation Age of Onset   Emphysema Father    Lung cancer Father    Heart disease Brother    Heart failure Mother    Diabetes Mother    Social History   Socioeconomic History   Marital status: Married    Spouse name: Lattie Haw   Number of children: 1   Years of education: Not on file   Highest education level: Associate degree: occupational,  technical, or vocational program  Occupational History   Occupation: Company secretary: [employer]    Comment: retired  Tobacco Use   Smoking status: Former    Packs/day: 1.50    Years: 45.00    Total pack years: 67.50    Types: Cigarettes    Quit date: 11/11/2017    Years since quitting: 4.0   Smokeless tobacco: Never   Tobacco comments:        Vaping Use   Vaping Use: Never used  Substance and Sexual Activity   Alcohol use: Yes    Alcohol/week: 18.0 standard drinks of alcohol    Types: 18 Cans of beer per week    Comment: 11/28/20 - trying to cut back/quit - down to 2-3 per day   Drug use: No   Sexual activity: Not Currently  Other Topics Concern   Not on file  Social History Narrative   Disabled      Lives with wife      [place]      Asbestosis, COPD - chronic SOB      Alcoholic - trying to quit; He did quit smoking 2019   Social Determinants of Health   Financial Resource Strain: Low Risk  (11/29/2021)   Overall Financial Resource Strain (CARDIA)    Difficulty of Paying Living Expenses: Not hard at all  Food Insecurity: No Food Insecurity  (11/29/2021)   Hunger Vital Sign    Worried About Running Out of Food in the Last Year: Never true    Ran Out of Food in the Last Year: Never true  Transportation Needs: No Transportation Needs (11/29/2021)   PRAPARE - Hydrologist (Medical): No    Lack of Transportation (Non-Medical): No  Physical Activity: Inactive (11/29/2021)   Exercise Vital Sign    Days of Exercise per Week: 0 days    Minutes of Exercise per Session: 0 min  Stress: Stress Concern Present (11/29/2021)   [place]    Feeling of Stress : To some extent  Social Connections: Socially Integrated (11/29/2021)   Social Connection and Isolation Panel [NHANES]    Frequency of Communication with Friends and Family: More than three times a week    Frequency of Social Gatherings with Friends and Family: More than three times a week    Attends Religious Services: 1 to 4 times per year    Active Member of [employer] or Organizations: Yes    Attends Archivist Meetings: 1 to 4 times per year    Marital Status: Married    Tobacco Counseling Counseling given: Not Answered Tobacco comments:      Clinical Intake:  Pre-visit preparation completed: Yes  Pain : No/denies pain     BMI - recorded: 31.75 Nutritional Status: BMI > 30  Obese Nutritional Risks: None Diabetes: Yes CBG done?: No Did pt. bring in CBG monitor from home?: No  How often do you need to have someone help you when you read instructions, pamphlets, or other written materials from your doctor or pharmacy?: 1 - Never  Diabetic? Nutrition Risk Assessment:  Has the patient had any N/V/D within the last 2 months?  No  Does the patient have any non-healing wounds?  No  Has the patient had any unintentional weight loss or weight gain?  No   Diabetes:  Is the patient diabetic?  Yes  If diabetic, was a CBG obtained today?  No  Did the patient bring in their  glucometer from home?  No  How often do you monitor your CBG's? BID.   Financial Strains and Diabetes Management:  Are you having any financial strains with the device, your supplies or your medication? No .  Does the patient want to be seen by Chronic Care Management for management of their diabetes?  No  Would the patient like to be referred to a Nutritionist or for Diabetic Management?  No   Diabetic Exams:  Diabetic Eye Exam: Completed 2023 with [facility] per patient.   Diabetic Foot Exam: Pt has been advised about the importance in completing this exam. Pt is scheduled for diabetic foot exam on 12/07/21.    Interpreter Needed?: No  Information entered by :: Twala Collings, LPN   Activities of Daily Living    11/29/2021    9:55 AM  In your present state of health, do you have any difficulty performing the following activities:  Hearing? 0  Vision? 0  Difficulty concentrating or making decisions? 0  Walking or climbing stairs? 1  Dressing or bathing? 0  Doing errands, shopping? 0  Preparing Food and eating ? N  Using the Toilet? N  In the past six months, have you accidently leaked urine? N  Do you have problems with loss of bowel control? N  Managing your Medications? N  Managing your Finances? N  Housekeeping or managing your Housekeeping? Y    Patient Care Team: Claretta Fraise, MD as PCP - General (Family Medicine) Tanda Rockers, MD as Consulting Physician (Pulmonary Disease) Lavonna Monarch, MD as Consulting Physician (Dermatology) [facility] Lavera Guise, [facility] as Pharmacist (Family Medicine)  Indicate any recent Medical Services you may have received from other than [facility] providers in the past year (date may be approximate).     Assessment:   This is a routine wellness examination for [place].  Hearing/Vision screen Hearing Screening - Comments:: Denies hearing difficulties   Vision Screening - Comments:: Wears rx glasses - up to date with  routine eye exams with [facility]  Dietary issues and exercise activities discussed: Current Exercise Habits: The patient does not participate in regular exercise at present, Exercise limited by: orthopedic condition(s);respiratory conditions(s)   Goals Addressed             This Visit's Progress    Exercise 3x per week (30 min per time)   Not on track    Hopes to lose weight and be able to get more active     Reduce alcohol intake   Not on track    Do other activities such as visit a friend, taking a drive, exercising when feeling the urge to drink alcohol.        Depression Screen    11/29/2021    9:54 AM 06/01/2021   10:27 AM 03/01/2021    8:57 AM 11/29/2020   10:53 AM 11/28/2020   11:29 AM 10/25/2020    9:37 AM 10/03/2020    8:06 AM  PHQ 2/9 Scores  PHQ - 2 Score 1 0 0 0 0 0 0  PHQ- 9 Score 3          Fall Risk    11/29/2021    9:45 AM 06/01/2021   10:27 AM 03/01/2021    8:57 AM 11/29/2020   10:53 AM 11/28/2020   11:30 AM  Fall Risk   Falls in the past year? 0 0 0 0 0  Number falls in  past yr: 0    0  Injury with Fall? 0    0  Risk for fall due to : Orthopedic patient    Orthopedic patient  Follow up Falls prevention discussed    Falls prevention discussed    [place]:  Any stairs in or around the home? Yes  If so, are there any without handrails? No  Home free of loose throw rugs in walkways, pet beds, electrical cords, etc? Yes  Adequate lighting in your home to reduce risk of falls? Yes   ASSISTIVE DEVICES UTILIZED TO PREVENT FALLS:  Life alert? No  Use of a cane, walker or w/c? No  Grab bars in the bathroom? Yes  Shower chair or bench in shower? Yes  Elevated toilet seat or a handicapped toilet? Yes   TIMED UP AND GO:  Was the test performed? No . Telephonic visit  Cognitive Function:    07/18/2018   10:56 AM  MMSE - Mini Mental State Exam  Orientation to time 5  Orientation to Place 5  Registration 3  Attention/  Calculation 5  Recall 3  Language- name 2 objects 2  Language- repeat 1  Language- follow 3 step command 3  Language- read & follow direction 1  Write a sentence 1  Copy design 0  Total score 29        11/29/2021    9:57 AM 07/21/2019    9:42 AM  6CIT Screen  What Year? 0 points 0 points  What month? 0 points   What time? 0 points 0 points  Count back from 20 0 points 0 points  Months in reverse 0 points 0 points  Repeat phrase 0 points 0 points  Total Score 0 points     Immunizations Immunization History  Administered Date(s) Administered   Influenza Split 02/12/2012, 02/11/2013   Influenza Whole 03/12/2011   Influenza,inj,Quad PF,6+ Mos 02/21/2015, 03/07/2016, 03/06/2017, 02/20/2018, 03/10/2019, 02/02/2020, 03/01/2021   Moderna Covid-19 Vaccine Bivalent Booster 58yr & up 03/28/2021   Moderna SARS-COV2 Booster Vaccination 04/19/2020, 10/18/2020   Moderna Sars-Covid-2 Vaccination 08/05/2019, 08/31/2019   Pneumococcal Conjugate-13 02/21/2015   Pneumococcal Polysaccharide-23 04/14/2013   Td 05/14/2002   Tdap 02/06/2013    TDAP status: Up to date  Flu Vaccine status: Up to date  Pneumococcal vaccine status: Up to date  Covid-19 vaccine status: Completed vaccines  Qualifies for Shingles Vaccine? Yes   Zostavax completed No   Shingrix Completed?: No.    Education has been provided regarding the importance of this vaccine. Patient has been advised to call insurance company to determine out of pocket expense if they have not yet received this vaccine. Advised may also receive vaccine at local pharmacy or Health Dept. Verbalized acceptance and understanding.  Screening Tests Health Maintenance  Topic Date Due   FOOT EXAM  Never done   OPHTHALMOLOGY EXAM  Never done   Zoster Vaccines- Shingrix (1 of 2) Never done   COLONOSCOPY (Pts 45-457yrInsurance coverage will need to be confirmed)  02/23/2016   COVID-19 Vaccine (4 - Booster for Moderna series) 05/23/2021    HEMOGLOBIN A1C  11/29/2021   INFLUENZA VACCINE  12/12/2021   TETANUS/TDAP  02/07/2023   Hepatitis C Screening  Completed   HIV Screening  Completed   HPV VACCINES  Aged Out    Health Maintenance  Health Maintenance Due  Topic Date Due   FOOT EXAM  Never done   OPHTHALMOLOGY EXAM  Never done  Zoster Vaccines- Shingrix (1 of 2) Never done   COLONOSCOPY (Pts 45-37yr Insurance coverage will need to be confirmed)  02/23/2016   COVID-19 Vaccine (4 - Booster for Moderna series) 05/23/2021   HEMOGLOBIN A1C  11/29/2021    Colorectal cancer screening: Type of screening: Colonoscopy. Completed 2018 per patient. Repeat every 5 years  Lung Cancer Screening: (Low Dose CT Chest recommended if Age 387-80years, 30 pack-year currently smoking OR have quit w/in 15years.) does qualify.   Lung Cancer Screening Referral: [place]  Additional Screening:  Hepatitis C Screening: does qualify; Completed 12/05/2015  Vision Screening: Recommended annual ophthalmology exams for early detection of glaucoma and other disorders of the eye. Is the patient up to date with their annual eye exam?  Yes  Who is the provider or what is the name of the office in which the patient attends annual eye exams? [facility] If pt is not established with a provider, would they like to be referred to a provider to establish care? No .   Dental Screening: Recommended annual dental exams for proper oral hygiene  Community Resource Referral / Chronic Care Management: CRR required this visit?  No   CCM required this visit?  No      Plan:     I have personally reviewed and noted the following in the patient's chart:   Medical and social history Use of alcohol, tobacco or illicit drugs  Current medications and supplements including opioid prescriptions. Patient is not currently taking opioid prescriptions. Functional ability and status Nutritional  status Physical activity Advanced directives List of other physicians Hospitalizations, surgeries, and ER visits in previous 12 months Vitals Screenings to include cognitive, depression, and falls Referrals and appointments  In addition, I have reviewed and discussed with patient certain preventive protocols, quality metrics, and best practice recommendations. A written personalized care plan for preventive services as well as general preventive health recommendations were provided to patient.     ASandrea Hammond LPN   [id number]  Nurse Notes: None

## 2021-11-29 NOTE — Patient Instructions (Signed)
Frank Rosales , Thank you for taking time to come for your Medicare Wellness Visit. I appreciate your ongoing commitment to your health goals. Please review the following plan we discussed and let me know if I can assist you in the future.   Screening recommendations/referrals: Colonoscopy: Done 2018 - Repeat in 5 years at [facility] - make appointment soon Recommended yearly ophthalmology/optometry visit for glaucoma screening and checkup Recommended yearly dental visit for hygiene and checkup  Vaccinations: Influenza vaccine: Done 03/11/2021 - Repeat annually  Pneumococcal vaccine: Done 04/14/2013 & 02/21/2015  Tdap vaccine: Done 02/06/2013 - Repeat in 10 years  Shingles vaccine: Due - Shingrix is 2 doses 2-6 months apart and over 90% effective     Covid-19: Done 08/05/2019, 08/31/2019, 04/19/2020, 10/18/2020, & 03/28/2021  Advanced directives: Please bring a copy of your health care power of attorney and living will to the office to be added to your chart at your convenience.   Conditions/risks identified: Aim for 30 minutes of exercise or brisk walking, 6-8 glasses of water, and 5 servings of fruits and vegetables each day. If you wish to quit smoking, help is available. For free tobacco cessation program offerings call the [facility] at (913)343-7548 or Live Well Line at 561-072-6420. You may also visit www.[place].com or email livelifewell'@[place] .com for more information on other programs.   You may also call 1-800-QUIT-NOW 951-869-1170) or visit www.VirusCrisis.dk or www.BecomeAnEx.org for additional resources on smoking cessation.    Next appointment: Follow up in one year for your annual wellness visit   Preventive Care 40-64 Years, Male Preventive care refers to lifestyle choices and visits with your health care provider that can promote health and wellness. What does preventive care include? A yearly physical exam. This is also called an annual well check. Dental  exams once or twice a year. Routine eye exams. Ask your health care provider how often you should have your eyes checked. Personal lifestyle choices, including: Daily care of your teeth and gums. Regular physical activity. Eating a healthy diet. Avoiding tobacco and drug use. Limiting alcohol use. Practicing safe sex. Taking low-dose aspirin every day starting at age 20. What happens during an annual well check? The services and screenings done by your health care provider during your annual well check will depend on your age, overall health, lifestyle risk factors, and family history of disease. Counseling  Your health care provider may ask you questions about your: Alcohol use. Tobacco use. Drug use. Emotional well-being. Home and relationship well-being. Sexual activity. Eating habits. Work and work Statistician. Screening  You may have the following tests or measurements: Height, weight, and BMI. Blood pressure. Lipid and cholesterol levels. These may be checked every 5 years, or more frequently if you are over 34 years old. Skin check. Lung cancer screening. You may have this screening every year starting at age 84 if you have a 30-pack-year history of smoking and currently smoke or have quit within the past 15 years. Fecal occult blood test (FOBT) of the stool. You may have this test every year starting at age 4. Flexible sigmoidoscopy or colonoscopy. You may have a sigmoidoscopy every 5 years or a colonoscopy every 10 years starting at age 10. Prostate cancer screening. Recommendations will vary depending on your family history and other risks. Hepatitis C blood test. Hepatitis B blood test. Sexually transmitted disease (STD) testing. Diabetes screening. This is done by checking your blood sugar (glucose) after you have not eaten for a while (fasting).  You may have this done every 1-3 years. Discuss your test results, treatment options, and if necessary, the need for more  tests with your health care provider. Vaccines  Your health care provider may recommend certain vaccines, such as: Influenza vaccine. This is recommended every year. Tetanus, diphtheria, and acellular pertussis (Tdap, Td) vaccine. You may need a Td booster every 10 years. Zoster vaccine. You may need this after age 39. Pneumococcal 13-valent conjugate (PCV13) vaccine. You may need this if you have certain conditions and have not been vaccinated. Pneumococcal polysaccharide (PPSV23) vaccine. You may need one or two doses if you smoke cigarettes or if you have certain conditions. Talk to your health care provider about which screenings and vaccines you need and how often you need them. This information is not intended to replace advice given to you by your health care provider. Make sure you discuss any questions you have with your health care provider. Document Released: 05/27/2015 Document Revised: 01/18/2016 Document Reviewed: 03/01/2015 Elsevier Interactive Patient Education  2017 [place] Prevention in the Home Falls can cause injuries. They can happen to people of all ages. There are many things you can do to make your home safe and to help prevent falls. What can I do on the outside of my home? Regularly fix the edges of walkways and driveways and fix any cracks. Remove anything that might make you trip as you walk through a door, such as a raised step or threshold. Trim any bushes or trees on the path to your home. Use bright outdoor lighting. Clear any walking paths of anything that might make someone trip, such as rocks or tools. Regularly check to see if handrails are loose or broken. Make sure that both sides of any steps have handrails. Any raised decks and porches should have guardrails on the edges. Have any leaves, snow, or ice cleared regularly. Use sand or salt on walking paths during winter. Clean up any spills in your garage right away. This includes oil or  grease spills. What can I do in the bathroom? Use night lights. Install grab bars by the toilet and in the tub and shower. Do not use towel bars as grab bars. Use non-skid mats or decals in the tub or shower. If you need to sit down in the shower, use a plastic, non-slip stool. Keep the floor dry. Clean up any water that spills on the floor as soon as it happens. Remove soap buildup in the tub or shower regularly. Attach bath mats securely with double-sided non-slip rug tape. Do not have throw rugs and other things on the floor that can make you trip. What can I do in the bedroom? Use night lights. Make sure that you have a light by your bed that is easy to reach. Do not use any sheets or blankets that are too big for your bed. They should not hang down onto the floor. Have a firm chair that has side arms. You can use this for support while you get dressed. Do not have throw rugs and other things on the floor that can make you trip. What can I do in the kitchen? Clean up any spills right away. Avoid walking on wet floors. Keep items that you use a lot in easy-to-reach places. If you need to reach something above you, use a strong step stool that has a grab bar. Keep electrical cords out of the way. Do not use floor polish or wax that makes floors  slippery. If you must use wax, use non-skid floor wax. Do not have throw rugs and other things on the floor that can make you trip. What can I do with my stairs? Do not leave any items on the stairs. Make sure that there are handrails on both sides of the stairs and use them. Fix handrails that are broken or loose. Make sure that handrails are as long as the stairways. Check any carpeting to make sure that it is firmly attached to the stairs. Fix any carpet that is loose or worn. Avoid having throw rugs at the top or bottom of the stairs. If you do have throw rugs, attach them to the floor with carpet tape. Make sure that you have a light switch  at the top of the stairs and the bottom of the stairs. If you do not have them, ask someone to add them for you. What else can I do to help prevent falls? Wear shoes that: Do not have high heels. Have rubber bottoms. Are comfortable and fit you well. Are closed at the toe. Do not wear sandals. If you use a stepladder: Make sure that it is fully opened. Do not climb a closed stepladder. Make sure that both sides of the stepladder are locked into place. Ask someone to hold it for you, if possible. Clearly mark and make sure that you can see: Any grab bars or handrails. First and last steps. Where the edge of each step is. Use tools that help you move around (mobility aids) if they are needed. These include: Canes. Walkers. Scooters. Crutches. Turn on the lights when you go into a dark area. Replace any light bulbs as soon as they burn out. Set up your furniture so you have a clear path. Avoid moving your furniture around. If any of your floors are uneven, fix them. If there are any pets around you, be aware of where they are. Review your medicines with your doctor. Some medicines can make you feel dizzy. This can increase your chance of falling. Ask your doctor what other things that you can do to help prevent falls. This information is not intended to replace advice given to you by your health care provider. Make sure you discuss any questions you have with your health care provider. Document Released: 02/24/2009 Document Revised: 10/06/2015 Document Reviewed: 06/04/2014 Elsevier Interactive Patient Education  2017 Elsevier Inc.   Alcohol Abuse and Dependence Information, Adult Alcohol is a widely available drug. People drink alcohol in different amounts. People who drink alcohol very often and in large amounts often have problems during and after drinking. They may develop what is called an alcohol use disorder. There are two main types of alcohol use disorders: Alcohol abuse. This  is when you use alcohol too much or too often. You may use alcohol to make yourself feel happy or to reduce stress. You may have a hard time setting a limit on the amount you drink. Alcohol dependence. This is when you use alcohol consistently for a period of time, and your body changes as a result. This can make it hard to stop drinking because you may start to feel sick or feel different when you do not use alcohol. These symptoms are known as withdrawal. How can alcohol abuse and dependence affect me? Alcohol abuse and dependence can have a negative effect on your life. Drinking too much can lead to addiction. You may feel like you need alcohol to function normally. You may drink alcohol  before work in the morning, during the day, or as soon as you get home from work in the evening. These actions can result in: Poor work performance. Job loss. Financial problems. Car crashes or criminal charges from driving after drinking alcohol. Problems in your relationships with friends and family. Losing the trust and respect of coworkers, friends, and family. Drinking heavily over a long period of time can permanently damage your body and brain, and can cause lifelong health issues, such as: Damage to your liver or pancreas. Heart problems, high blood pressure, or stroke. Certain cancers. Decreased ability to fight infections. Brain or nerve damage. Depression. Early (premature) death. If you are careless or you crave alcohol, it is easy to drink more than your body can handle (overdose). Alcohol overdose is a serious situation that requires hospitalization. It may lead to permanent injuries or death. What can increase my risk? Having a family history of alcohol abuse. Having depression or other mental health conditions. Beginning to drink at an early age. Binge drinking often. Experiencing trauma, stress, and an unstable home life during childhood. Spending time with people who drink often. What  actions can I take to prevent or manage alcohol abuse and dependence? Do not drink alcohol if: Your health care provider tells you not to drink. You are pregnant, may be pregnant, or are planning to become pregnant. If you drink alcohol: Limit how much you use to: 0-1 drink a day for women. 0-2 drinks a day for men. Be aware of how much alcohol is in your drink. In the [place] one drink equals one 12 oz bottle of beer (355 mL), one 5 oz glass of wine (148 mL), or one 1 oz glass of hard liquor (44 mL). Stop drinking if you have been drinking too much. This can be very hard to do if you are used to abusing alcohol. If you begin to have withdrawal symptoms, talk with your health care provider or a person that you trust. These symptoms may include anxiety, shaky hands, headache, nausea, sweating, or not being able to sleep. Choose to drink nonalcoholic beverages in social gatherings and places where there may be alcohol. Activity Spend more time on activities that you enjoy that do not involve alcohol, like hobbies or exercise. Find healthy ways to cope with stress, such as exercise, meditation, or spending time with people you care about. General information Talk to your family, coworkers, and friends about supporting you in your efforts to stop drinking. If they drink, ask them not to drink around you. Spend more time with people who do not drink alcohol. If you think that you have an alcohol dependency problem: Tell friends or family about your concerns. Talk with your health care provider or another health professional about where to get help. Work with a Transport planner and a Regulatory affairs officer. Consider joining a support group for people who struggle with alcohol abuse and dependence. Where to find support  Your health care provider. SMART Recovery: www.smartrecovery.org Therapy and support groups Local treatment centers or chemical dependency counselors. Local AA groups in your  community: [place].com.pt Where to find more information Centers for Disease Control and Prevention: http://www.wolf.info/ National Institute on Alcohol Abuse and Alcoholism: http://www.br[place]shaw.com/ Alcoholics Anonymous (AA): [place].com.pt Contact a health care provider if: You drank more or for longer than you intended on more than one occasion. You tried to stop drinking or to cut back on how much you drink, but you were not able to. You often drink to  the point of vomiting or passing out. You want to drink so badly that you cannot think about anything else. You have problems in your life due to drinking, but you continue to drink. You keep drinking even though you feel anxious, depressed, or have experienced memory loss. You have stopped doing the things you used to enjoy in order to drink. You have to drink more than you used to in order to get the effect you want. You experience anxiety, sweating, nausea, shakiness, and trouble sleeping when you try to stop drinking. Get help right away if: You have thoughts about hurting yourself or others. You have serious withdrawal symptoms, including: Confusion. Racing heart. High blood pressure. Fever. If you ever feel like you may hurt yourself or others, or have thoughts about taking your own life, get help right away. You can go to your nearest emergency department or call: Your local emergency services (911 in the [place] A suicide crisis helpline, such as the [place] at 804-439-5484 or 988 in the [place]. This is open 24 hours a day. Summary Alcohol abuse and dependence can have a negative effect on your life. Drinking too much or too often can lead to addiction. If you drink alcohol, limit how much you use. If you are having trouble keeping your drinking under control, find ways to change your behavior. Hobbies, calming activities, exercise, or support groups can help. If you feel you need help with changing your drinking habits, talk  with your health care provider, a good friend, or a therapist, or go to an [place] group. This information is not intended to replace advice given to you by your health care provider. Make sure you discuss any questions you have with your health care provider. Document Revised: 03/24/2021 Document Reviewed: 07/08/2018 Elsevier Patient Education  [place].

## 2021-11-30 ENCOUNTER — Ambulatory Visit: Payer: Medicare Other | Admitting: Family Medicine

## 2021-12-07 ENCOUNTER — Encounter: Payer: Self-pay | Admitting: Family Medicine

## 2021-12-07 ENCOUNTER — Ambulatory Visit (INDEPENDENT_AMBULATORY_CARE_PROVIDER_SITE_OTHER): Payer: Medicare Other | Admitting: Family Medicine

## 2021-12-07 VITALS — BP 128/70 | HR 111 | Temp 100.4°F | Ht 69.0 in | Wt 215.0 lb

## 2021-12-07 DIAGNOSIS — E782 Mixed hyperlipidemia: Secondary | ICD-10-CM | POA: Diagnosis not present

## 2021-12-07 DIAGNOSIS — E039 Hypothyroidism, unspecified: Secondary | ICD-10-CM

## 2021-12-07 DIAGNOSIS — E756 Lipid storage disorder, unspecified: Secondary | ICD-10-CM | POA: Diagnosis not present

## 2021-12-07 DIAGNOSIS — I10 Essential (primary) hypertension: Secondary | ICD-10-CM

## 2021-12-07 DIAGNOSIS — E1169 Type 2 diabetes mellitus with other specified complication: Secondary | ICD-10-CM | POA: Diagnosis not present

## 2021-12-07 DIAGNOSIS — D539 Nutritional anemia, unspecified: Secondary | ICD-10-CM | POA: Diagnosis not present

## 2021-12-07 DIAGNOSIS — E119 Type 2 diabetes mellitus without complications: Secondary | ICD-10-CM | POA: Diagnosis not present

## 2021-12-07 LAB — BAYER DCA HB A1C WAIVED: HB A1C (BAYER DCA - WAIVED): 5.4 % (ref 4.8–5.6)

## 2021-12-07 NOTE — Progress Notes (Signed)
Subjective:  Patient ID: Frank Rosales, male    DOB: 10-27-1955  Age: 66 y.o. MRN: [id number]  CC: Medical Management of Chronic Issues   HPI Frank Rosales presents forFollow-up of diabetes. Patient checks blood sugar at home.   130-150 fasting and the same postprandial Patient denies symptoms such as polyuria, polydipsia, excessive hunger, nausea No significant hypoglycemic spells noted. Medications reviewed. Pt reports taking them regularly without complication/adverse reaction being reported today.    History Frank Rosales has a past medical history of Anxiety, Arthritis, Asthma, Atypical nevus (03/27/2011), Atypical nevus (01/20/2007), Colon polyps, COPD (chronic obstructive pulmonary disease) ([place]), Diabetes mellitus without complication ([place]), High blood pressure, High cholesterol, Hilar adenopathy, Hypothyroidism, Multiple rib fractures (08/2016), Nodular basal cell carcinoma (BCC) (02/18/2020), Nodular basal cell carcinoma (BCC) (07/04/2021), Nodular basal cell carcinoma (BCC) (07/04/2021), SCCA (squamous cell carcinoma) of skin (02/18/2020), SCCA (squamous cell carcinoma) of skin (02/18/2020), SCCA (squamous cell carcinoma) of skin (02/18/2020), Squamous cell carcinoma in situ (SCCIS) (01/09/2016), and Superficial nodular basal cell carcinoma (BCC) (01/09/2016).   He has a past surgical history that includes Knee arthroscopy (Right, 05/14/2010); Wisdom tooth extraction; epidural injections; Fixation kyphoplasty lumbar spine; Back surgery; Video assisted thoracoscopy (Left, 08/22/2016); Pleural effusion drainage (Left, 08/22/2016); and Rib plating (Left, 08/22/2016).   His family history includes Diabetes in his mother; Emphysema in his father; Heart disease in his brother; Heart failure in his mother; Lung cancer in his father.He reports that he quit smoking about 4 years ago. His smoking use included cigarettes. He has a 67.50 pack-year smoking history. He has never used smokeless tobacco. He  reports current alcohol use of about 18.0 standard drinks of alcohol per week. He reports that he does not use drugs.  Current Outpatient Medications on File Prior to Visit  Medication Sig Dispense Refill   Ascorbic Acid (VITAMIN C) 100 MG tablet Take 100 mg by mouth daily.     atorvastatin (LIPITOR) 40 MG tablet TAKE 1 TABLET BY MOUTH  DAILY FOR CHOLESTEROL 90 tablet 3   Budeson-Glycopyrrol-Formoterol (BREZTRI AEROSPHERE) 160-9-4.8 MCG/ACT AERO Inhale 2 puffs into the lungs 2 (two) times daily. 32.1 g 11   cholecalciferol (VITAMIN D) 1000 UNITS tablet Take 1,000 Units by mouth daily.      dapagliflozin propanediol (FARXIGA) 10 MG TABS tablet Take 1 tablet (10 mg total) by mouth daily before breakfast. 90 tablet 5   diphenhydramine-acetaminophen (TYLENOL PM) 25-500 MG TABS tablet Take 1 tablet by mouth at bedtime as needed.     dupilumab (DUPIXENT) 300 MG/2ML prefilled syringe Inject 300 mg into the skin every 14 (fourteen) days. Starting at day 15 for maintenance. 6 mL 2   Famotidine (PEPCID PO) Take 1 tablet by mouth daily.     folic acid (FOLVITE) 1 MG tablet Take 1 tablet (1 mg total) by mouth daily. 90 tablet 3   guaiFENesin (MUCINEX) 600 MG 12 hr tablet Take by mouth 2 (two) times daily as needed.     levothyroxine (SYNTHROID) 50 MCG tablet TAKE 1 TABLET BY MOUTH IN  THE MORNING BEFORE  BREAKFAST WAIT 1 HOUR AFTER TAKING TO EAT OR DRINK 100 tablet 2   metFORMIN (GLUCOPHAGE-XR) 750 MG 24 hr tablet TAKE 2 TABLETS BY MOUTH DAILY  WITH BREAKFAST 180 tablet 0   NIFEdipine (ADALAT CC) 60 MG 24 hr tablet TAKE 1 TABLET BY MOUTH TWICE  DAILY 200 tablet 2   olmesartan-hydrochlorothiazide (BENICAR HCT) 40-25 MG tablet TAKE 1 TABLET BY MOUTH  DAILY 100 tablet 0  vitamin B-12 (CYANOCOBALAMIN) 250 MCG tablet Take 250 mcg by mouth daily.     albuterol (PROAIR HFA) 108 (90 Base) MCG/ACT inhaler Inhale 2 puffs into the lungs every 6 (six) hours. (Patient not taking: Reported on 11/29/2021) 54 g 1    albuterol (PROVENTIL) (2.5 MG/3ML) 0.083% nebulizer solution Take 3 mLs (2.5 mg total) by nebulization every 4 (four) hours as needed for wheezing or shortness of breath. (Patient not taking: Reported on 11/29/2021) 240 mL 5   No current facility-administered medications on file prior to visit.    ROS Review of Systems  Objective:  BP 128/70   Pulse (!) 111   Temp (!) 100.4 F (38 C)   Ht _0  (1.753 m)   Wt 215 lb (97.5 kg)   SpO2 91%   BMI 31.75 kg/m   BP Readings from Last 3 Encounters:  12/07/21 128/70  09/29/21 (!) 144/86  06/01/21 135/80    Wt Readings from Last 3 Encounters:  12/07/21 215 lb (97.5 kg)  11/29/21 215 lb (97.5 kg)  09/29/21 212 lb (96.2 kg)     Physical Exam Vitals reviewed.  Constitutional:      Appearance: He is well-developed.  HENT:     Head: Normocephalic and atraumatic.     Right Ear: External ear normal.     Left Ear: External ear normal.     Mouth/Throat:     Pharynx: No oropharyngeal exudate or posterior oropharyngeal erythema.  Eyes:     Pupils: Pupils are equal, round, and reactive to light.  Cardiovascular:     Rate and Rhythm: Normal rate and regular rhythm.     Heart sounds: No murmur heard. Pulmonary:     Effort: Pulmonary effort is normal. No respiratory distress.     Comments: Decreased breath sounds throughout all fields Musculoskeletal:     Cervical back: Normal range of motion and neck supple.  Neurological:     Mental Status: He is alert and oriented to person, place, and time.       Assessment & Plan:   Frank Rosales was seen today for medical management of chronic issues.  Diagnoses and all orders for this visit:  Mixed hyperlipidemia -     Lipid panel  Diabetic lipidosis ([place]) -     Bayer DCA Hb A1c Waived  Hypothyroidism, unspecified type -     TSH + free T4  Essential hypertension -     CBC with Differential/Platelet -     CMP14+EGFR    0  I am having Frank Rosales maintain his  cholecalciferol, vitamin Z-99, folic acid, Famotidine (PEPCID PO), vitamin C, albuterol, Dupixent, albuterol, atorvastatin, Breztri Aerosphere, dapagliflozin propanediol, olmesartan-hydrochlorothiazide, NIFEdipine, levothyroxine, diphenhydramine-acetaminophen, guaiFENesin, and metFORMIN.  No orders of the defined types were placed in this encounter.    Follow-up: Return in about 6 months (around 06/09/2022) for [facility] physical.  Claretta Fraise, M.D.

## 2021-12-08 LAB — CBC WITH DIFFERENTIAL/PLATELET
Basophils Absolute: 0.1 10*3/uL (ref 0.0–0.2)
Basos: 1 %
EOS (ABSOLUTE): 0.1 10*3/uL (ref 0.0–0.4)
Eos: 1 %
Hematocrit: 47.1 % (ref 37.5–51.0)
Hemoglobin: 17.2 g/dL (ref 13.0–17.7)
Immature Grans (Abs): 0 10*3/uL (ref 0.0–0.1)
Immature Granulocytes: 0 %
Lymphocytes Absolute: 1.2 10*3/uL (ref 0.7–3.1)
Lymphs: 12 %
MCH: 36.4 pg — ABNORMAL HIGH (ref 26.6–33.0)
MCHC: 36.5 g/dL — ABNORMAL HIGH (ref 31.5–35.7)
MCV: 100 fL — ABNORMAL HIGH (ref 79–97)
Monocytes Absolute: 1 10*3/uL — ABNORMAL HIGH (ref 0.1–0.9)
Monocytes: 11 %
Neutrophils Absolute: 6.9 10*3/uL (ref 1.4–7.0)
Neutrophils: 75 %
Platelets: 219 10*3/uL (ref 150–450)
RBC: 4.73 x10E6/uL (ref 4.14–5.80)
RDW: 12.1 % (ref 11.6–15.4)
WBC: 9.3 10*3/uL (ref 3.4–10.8)

## 2021-12-08 LAB — CMP14+EGFR
ALT: 42 IU/L (ref 0–44)
AST: 43 IU/L — ABNORMAL HIGH (ref 0–40)
Albumin/Globulin Ratio: 1.7 (ref 1.2–2.2)
Albumin: 4.7 g/dL (ref 3.9–4.9)
Alkaline Phosphatase: 86 IU/L (ref 44–121)
BUN/Creatinine Ratio: 11 (ref 10–24)
BUN: 11 mg/dL (ref 8–27)
Bilirubin Total: 0.8 mg/dL (ref 0.0–1.2)
CO2: 20 mmol/L (ref 20–29)
Calcium: 9.5 mg/dL (ref 8.6–10.2)
Chloride: 91 mmol/L — ABNORMAL LOW (ref 96–106)
Creatinine, Ser: 1.02 mg/dL (ref 0.76–1.27)
Globulin, Total: 2.7 g/dL (ref 1.5–4.5)
Sodium: 133 mmol/L — ABNORMAL LOW (ref 134–144)
Total Protein: 7.4 g/dL (ref 6.0–8.5)
eGFR: 83 mL/min/{1.73_m2} (ref >59)

## 2021-12-08 LAB — LIPID PANEL
Chol/HDL Ratio: 2.7 ratio (ref 0.0–5.0)
Cholesterol, Total: 221 mg/dL — ABNORMAL HIGH (ref 100–199)
HDL: 81 mg/dL (ref >39)
LDL Chol Calc (NIH): 124 mg/dL — ABNORMAL HIGH (ref 0–99)
Triglycerides: 92 mg/dL (ref 0–149)
VLDL Cholesterol Cal: 16 mg/dL (ref 5–40)

## 2021-12-08 LAB — TSH+FREE T4
Free T4: 1.51 ng/dL (ref 0.82–1.77)
TSH: 2.72 u[IU]/mL (ref 0.450–4.500)

## 2021-12-13 LAB — FOLATE: Folate: >20 ng/mL (ref >3.0)

## 2021-12-13 LAB — SPECIMEN STATUS REPORT

## 2021-12-13 LAB — VITAMIN B12: Vitamin B-12: 752 pg/mL (ref 232–1245)

## 2022-01-19 ENCOUNTER — Other Ambulatory Visit: Payer: Self-pay | Admitting: Family Medicine

## 2022-01-25 DIAGNOSIS — K219 Gastro-esophageal reflux disease without esophagitis: Secondary | ICD-10-CM | POA: Diagnosis not present

## 2022-01-25 DIAGNOSIS — Z87891 Personal history of nicotine dependence: Secondary | ICD-10-CM | POA: Diagnosis not present

## 2022-02-07 ENCOUNTER — Ambulatory Visit: Payer: Medicare Other | Admitting: Dermatology

## 2022-02-23 ENCOUNTER — Other Ambulatory Visit: Payer: Self-pay | Admitting: Family Medicine

## 2022-03-09 ENCOUNTER — Ambulatory Visit (INDEPENDENT_AMBULATORY_CARE_PROVIDER_SITE_OTHER): Payer: Medicare Other

## 2022-03-09 DIAGNOSIS — Z23 Encounter for immunization: Secondary | ICD-10-CM | POA: Diagnosis not present

## 2022-03-16 ENCOUNTER — Telehealth: Payer: Self-pay | Admitting: Family Medicine

## 2022-03-19 NOTE — Telephone Encounter (Signed)
Pt calling again about this message.

## 2022-03-20 ENCOUNTER — Other Ambulatory Visit (HOSPITAL_COMMUNITY): Payer: Self-pay

## 2022-03-20 NOTE — Telephone Encounter (Signed)
Spoke with patient regarding his AZ&ME renewal.   Pt would like for me to mail the application to his home and he will return to me, or to the office directly, completed with tax forms.   Gave patient my name for future patient assistance questions. Also let him know my contact info will be in the re-enrollment application that will be mailed to him. Also informed him it's renewal season; company and offices are a little busy so renewal may take some time.

## 2022-03-20 NOTE — Telephone Encounter (Signed)
MAILED AZ&ME RE-ENROLLMENT APPLICATION TO PATIENTS HOME.

## 2022-03-20 NOTE — Telephone Encounter (Signed)
Patient has called the office twice within the hour being very rude, wanting to know if he can speak with Almyra Free. The first time he called, he asked if he could speak with Almyra Free and I told him I could send a message to her and see if she could call him back. He then said that a few messages had already been sent to [place] and he still hasn't received a call back. So then I tried offering help by looking up the messages in his chart to see if it was something I could help him with or something I could give him an update on but before I could finish offering, he interrupted me and said that I couldn't help and that all he needed to know was if Almyra Free was available now. I confirmed that she was not and that was the end of the call.  Shortly after, he called back and said "Is Almyra Free available now?" I confirmed that she was not and that I would have to send a message to her. He was rude again during this call and requested I send another message.  Please call patient with an update on initial message sent   Pt said [employer] sent him a message stating he has to print off new forms to renew in the drug program. He said he doesn't have computer and he can't fax forms back

## 2022-03-21 ENCOUNTER — Other Ambulatory Visit: Payer: Self-pay | Admitting: Family Medicine

## 2022-04-02 ENCOUNTER — Encounter: Payer: Self-pay | Admitting: Internal Medicine

## 2022-04-02 ENCOUNTER — Ambulatory Visit: Payer: Medicare Other | Admitting: Internal Medicine

## 2022-04-02 VITALS — BP 136/82 | HR 112 | Temp 98.0°F | Ht 69.0 in | Wt 212.0 lb

## 2022-04-02 DIAGNOSIS — J61 Pneumoconiosis due to asbestos and other mineral fibers: Secondary | ICD-10-CM | POA: Diagnosis not present

## 2022-04-02 DIAGNOSIS — J449 Chronic obstructive pulmonary disease, unspecified: Secondary | ICD-10-CM

## 2022-04-02 DIAGNOSIS — R490 Dysphonia: Secondary | ICD-10-CM | POA: Diagnosis not present

## 2022-04-02 NOTE — Assessment & Plan Note (Addendum)
Quit smoking MM Spirometry 11/2017  03/2011 FeV1 49%    Spirometry  02/28/2012  FEV1 45%   - Last day worked = October 13 2014  - PFT's  01/24/2015  FEV1 1.59  (43 % ) ratio 54   p 22 % improvement from saba with DLCO  88 % corrects to 99 % for alv volume s symbicort  - PFTs  12/27/15 1.30 (35%) ratio 35  In midst of a flare- - PFT's  12/21/2016  FEV1 1.40 (39 % ) ratio 50  p 8 % improvement from saba p symbicort 160 x 2  prior to study with DLCO  71/69 % corrects to 85  % for alv volume - PFTS  05/16/17  FEV1  1.32( 37%)  Ratio 37  And DLCO  52% > corrects to 78%  03/25/2017  try bevespi > preferred symb 160  - 06/13/2018    try adding spiriva 2.5 x 2 each am sample  > no better so did not fill rx  - 03/31/2019   Walked RA x one lap =  approx 250 ft @ mod pace - stopped due to sob with sats of 92% at the end of the study. - 02/01/2020  After extensive coaching inhaler device,  effectiveness =  90%  - alpha one AT def screen  02/02/20   MM  Level 151  -  09/16/2020   Walked RA   Moderate pace walk total 300 feet feeling short of breath at 200 feet but sats still 98% at end typical of a pink puffer - PFT's  01/01/22   FEV1 1.46(42 % ) ratio 0.42  p 0 % improvement from saba p ? prior to study with DLCO  19/7 (61%)  and FV curve classically concave       Group D (now reclassified as E) in terms of symptom/risk and laba/lama/ICS  therefore appropriate rx at this point >>>  breztri and approp saba  F/u can be yearly - call sooner prn          Each maintenance medication was reviewed in detail including emphasizing most importantly the difference between maintenance and prns and under what circumstances the prns are to be triggered using an action plan format where appropriate.  Total time for H and P, chart review, counseling, reviewing hfa/neb device(s) and generating customized AVS unique to this office visit / same day charting = 25 min

## 2022-04-02 NOTE — Progress Notes (Signed)
Subjective:   Patient ID: Frank Rosales, male    DOB: 1955-10-21    MRN: [id number]    Brief patient profile:  37  yowm/MM "quit smoking"  11/2017  with h/o exposure to Asbestosis at [place] ? Into the 80s  still able to walk  flat fine but steps x 3 flights sob with GOLD III copd 2013 prev eval by Dr Joya Gaskins  / gets annual f/u in [place]     History of Present Illness  02/16/2016  f/u ov/Frank Rosales re:  GOLD III copd /  symbicort 160 2bid / twice weekly saba  Chief Complaint  Patient presents with   Follow-up    Pt states had CT Chest done 12/27/15- ordered by worker's comp for eval of previous asbestos exp. He states he had been doing well until July 2017 developed cough and congestion that lasted 2 months, but starting to improve.   sick July - September 2017 rx with abx/ steroids better since early Sept 2017 but CT was done in middle of this illness and was abnormal but was done for purpose of screening for asbestos  rec Plan A = Automatic = symbicort 160 Take 2 puffs first thing in am and then another 2 puffs about 12 hours later.  Work on inhaler technique:   Only use your albuterol(proair)  as a rescue medication  The key is to stop smoking completely before smoking completely stops you!       02/01/2020  f/u ov/Frank Rosales re: GOLD III copd maint on symbicort/ dupixent per derm Chief Complaint  Patient presents with   Follow-up    Patient feels like his breathing has got worse since last visit. Struggles to breath with any exertion. Cough in morning with clear sputum.  Dyspnea:  Variable doe/ worse in shower / also limited by back pain which is chronic  Walks to shop x 50 yards slt hill coming back to house  Cough: some in am better p coffee  Sleeping: bed is flat/ two pillows  SABA use: once a week 02: none rec Try albuterol 15 min before an activity that you know would make you short of breath and see if it makes any difference and if makes none then don't take  it after activity unless you can't catch your breath.  Plan A = Automatic = Always=    Change to Breztri Take 2 puffs first thing in am and then another 2 puffs about 12 hours later.  Plan B = Backup (to supplement plan A, not to replace it) Only use your albuterol inhaler as a rescue medication Plan C = Crisis (instead of Plan B but only if Plan B stops working) - only use your albuterol nebulizer if you first try Plan B and it fails to help > ok to use the nebulizer up to every 4 hours but if start needing it regularly call for immediate appointment         04/03/2021  f/u ov/[place] office/Frank Rosales re: GOLD 3 maint on breztri / dupixent  and hs pepcid  Chief Complaint  Patient presents with   Follow-up    Coughing up green mucus, chest and throat are hurting. Increased in SOB. Has taken dayquil and mucinex since Tues. 03-28-21 after taking booster. No fever. Neg. Covid test yesterday.    Dyspnea:  able to walk to shop "just barely" at baseline  Cough: worse than usual 03/28/21 when got bivalent vaccine / neg covid Ag 04/02/21 Sleeping: flat bed /  2pillows   SABA use: just using hfa at baseline, even when sick just hfa  02: none  Covid status: vax x 5  Rec Plan A = Automatic = Always=    Breztri Take 2 puffs first thing in am and then another 2 puffs about 12 hours later.   Plan B = Backup (to supplement plan A, not to replace it) Only use your albuterol inhaler as a rescue medication  Plan C = Crisis (instead of Plan B but only if Plan B stops working) - only use your albuterol nebulizer if you first try Plan B and it fails to help > ok to use the nebulizer up to every 4 hours but if start needing it regularly call for immediate appointment For flares of coughing: Immediately start protonix 40 mg Take 30-60 min before first meal of the day until cough is better for a week and no longer need max dose of mucinex dm 1200  mg every 12 hours  Zpak  Prednisone 10 mg take  4 each am x 2  days,   2 each am x 2 days,  1 each am x 2 days and stop  Depomedrol 80 mg IM today  Please schedule a follow up visit in 6 months but call sooner if needed       09/29/2021  f/u ov/[place] office/Frank Rosales re: GOLD 3  maint on breztri/dupixent and pepcid each evening   Chief Complaint  Patient presents with   Follow-up    Cough and chest congestion have cleared up   Dyspnea:  shop and back s stopping  Cough: better  Sleeping: ok flat bed / 2 pillows  SABA use: not needing  02: none  Covid status: vax x 5  Lung cancer screening done annually in [place]  Rec My office will be contacting you by phone for referral to ENT for hoarseness > [place] 01/25/22 GERD      04/02/2022  f/u ov/Frank Rosales re: GOLD 3  maint on breztri   Chief Complaint  Patient presents with   Follow-up    Cough and breathing about the same since last ov  Patient in a lot of pain today due to arthritis   Dyspnea:  shop and back and as to sit down on way back slt uphill  Cough: none  Sleeping: flat bed /2 pillows  SABA use: rarely  02:  none     No obvious day to day or daytime variability or assoc excess/ purulent sputum or mucus plugs or hemoptysis or cp or chest tightness, subjective wheeze or overt sinus or hb symptoms.   sleeping without nocturnal  or early am exacerbation  of respiratory  c/o's or need for noct saba. Also denies any obvious fluctuation of symptoms with weather or environmental changes or other aggravating or alleviating factors except as outlined above   No unusual exposure hx or h/o childhood pna/ asthma or knowledge of premature birth.  Current Allergies, Complete Past Medical History, Past Surgical History, Family History, and Social History were reviewed in [employer] record.  ROS  The following are not active complaints unless bolded Hoarseness, sore throat, dysphagia, dental problems, itching, sneezing,  nasal congestion or discharge of excess  mucus or purulent secretions, ear ache,   fever, chills, sweats, unintended wt loss or wt gain, classically pleuritic or exertional cp,  orthopnea pnd or arm/hand swelling  or leg swelling, presyncope, palpitations, abdominal pain, anorexia, nausea, vomiting, diarrhea  or change in bowel  habits or change in bladder habits, change in stools or change in urine, dysuria, hematuria,  rash, arthralgias, visual complaints, headache, numbness, weakness or ataxia or problems with walking or coordination,  change in mood or  memory.        Current Meds  Medication Sig   albuterol (PROAIR HFA) 108 (90 Base) MCG/ACT inhaler Inhale 2 puffs into the lungs every 6 (six) hours.   albuterol (PROVENTIL) (2.5 MG/3ML) 0.083% nebulizer solution Take 3 mLs (2.5 mg total) by nebulization every 4 (four) hours as needed for wheezing or shortness of breath.   Ascorbic Acid (VITAMIN C) 100 MG tablet Take 100 mg by mouth daily.   atorvastatin (LIPITOR) 40 MG tablet TAKE 1 TABLET BY MOUTH  DAILY FOR CHOLESTEROL   Budeson-Glycopyrrol-Formoterol (BREZTRI AEROSPHERE) 160-9-4.8 MCG/ACT AERO Inhale 2 puffs into the lungs 2 (two) times daily.   cholecalciferol (VITAMIN D) 1000 UNITS tablet Take 1,000 Units by mouth daily.    dapagliflozin propanediol (FARXIGA) 10 MG TABS tablet Take 1 tablet (10 mg total) by mouth daily before breakfast.   diphenhydramine-acetaminophen (TYLENOL PM) 25-500 MG TABS tablet Take 1 tablet by mouth at bedtime as needed.   dupilumab (DUPIXENT) 300 MG/2ML prefilled syringe Inject 300 mg into the skin every 14 (fourteen) days. Starting at day 15 for maintenance.   Famotidine (PEPCID PO) Take 1 tablet by mouth daily.   folic acid (FOLVITE) 1 MG tablet Take 1 tablet (1 mg total) by mouth daily.   guaiFENesin (MUCINEX) 600 MG 12 hr tablet Take by mouth 2 (two) times daily as needed.   levothyroxine (SYNTHROID) 50 MCG tablet TAKE 1 TABLET BY MOUTH IN  THE MORNING BEFORE  BREAKFAST WAIT 1 HOUR AFTER TAKING TO EAT  OR DRINK   metFORMIN (GLUCOPHAGE-XR) 750 MG 24 hr tablet Take 2 tablets (1,500 mg total) by mouth daily with breakfast. (NEEDS TO BE SEEN BEFORE NEXT REFILL)   NIFEdipine (ADALAT CC) 60 MG 24 hr tablet TAKE 1 TABLET BY MOUTH TWICE  DAILY   olmesartan-hydrochlorothiazide (BENICAR HCT) 40-25 MG tablet TAKE 1 TABLET BY MOUTH DAILY   OneTouch Delica Lancets 81O MISC Check BS up to 4 x daily Dx E11.9   vitamin B-12 (CYANOCOBALAMIN) 250 MCG tablet Take 250 mcg by mouth daily.                      Objective:   Physical Exam  Wts  04/02/2022  212 09/29/2021    212 04/03/2021  203  09/16/2020      222 03/03/2020  219  02/01/2020    217 10/05/2019    228 03/31/2019  213  01/24/2015    180 > 02/21/2015  180  > 06/16/2015  186  > 02/16/2016  205 > 09/13/2016   185 >  11/07/2016  187 >  12/21/2016  195 > 03/25/2017   200 > 06/25/2017   198  > 12/23/2017   199  > 06/13/2018  200      11/10/14 174 lb (78.926 kg)  11/08/14 176 lb 6.4 oz (80.015 kg)  10/27/14 175 lb (79.379 kg)   Vital signs reviewed  04/02/2022  - Note at rest 02 sats  92% on RA    General appearance:    amb wm / gruff voice/ obviously having R hip pain and back pain with movement but still able to get on exam table   HEENT :  Oropharynx  clear   Nasal turbinates nl    NECK :  without JVD/Nodes/TM/ nl carotid upstrokes bilaterally   LUNGS: no acc muscle use,  Mod barrel  contour chest wall with bilateral  Distant bs s audible wheeze and  without cough on insp or exp maneuvers and mod  Hyperresonant  to  percussion bilaterally     CV:  RRR  no s3 or murmur or increase in P2, and no edema   ABD:  soft and nontender with pos mid insp Hoover's  in the supine position. No bruits or organomegaly appreciated, bowel sounds nl  MS:   Ext warm without deformities or   obvious joint restrictions , calf tenderness, cyanosis or clubbing  SKIN: warm and dry without lesions    NEURO:  alert, approp, nl sensorium with  no motor or cerebellar  deficits apparent.               Assessment & Plan:

## 2022-04-02 NOTE — Patient Instructions (Signed)
No change in medications but stay as active as you can   If the pain in your back is shooting down your leg and is improved by injection in your back that supports a nerve compression issue   Please schedule a follow up visit in 12  months but call sooner if needed

## 2022-04-02 NOTE — Assessment & Plan Note (Signed)
Referred to ENT for hoarseness > Frank Rosales 01/25/22 GERD

## 2022-04-03 ENCOUNTER — Other Ambulatory Visit: Payer: Self-pay | Admitting: Family Medicine

## 2022-04-03 DIAGNOSIS — E7800 Pure hypercholesterolemia, unspecified: Secondary | ICD-10-CM

## 2022-04-10 ENCOUNTER — Other Ambulatory Visit: Payer: Self-pay | Admitting: Family Medicine

## 2022-04-23 NOTE — Progress Notes (Signed)
[facility] Quality Team Note  Name: Frank Rosales Date of Birth: 12/08/1955 MRN: [id number] Date: 04/23/2022  [facility] Quality Team has reviewed this patient's chart, please see recommendations below:  [facility] Quality Other; (KED: Kidney Health Evaluation Gap- Patient needs Urine Albumin Creatinine Ratio Test completed for gap closure. EGFR has already been completed).  

## 2022-04-26 ENCOUNTER — Other Ambulatory Visit: Payer: Self-pay | Admitting: Family Medicine

## 2022-05-03 ENCOUNTER — Encounter: Payer: Self-pay | Admitting: Family Medicine

## 2022-05-03 ENCOUNTER — Ambulatory Visit (INDEPENDENT_AMBULATORY_CARE_PROVIDER_SITE_OTHER): Payer: Medicare Other | Admitting: Family Medicine

## 2022-05-03 VITALS — BP 139/72 | HR 111 | Temp 98.1°F | Ht 69.0 in | Wt 211.4 lb

## 2022-05-03 DIAGNOSIS — J449 Chronic obstructive pulmonary disease, unspecified: Secondary | ICD-10-CM | POA: Diagnosis not present

## 2022-05-03 DIAGNOSIS — L03116 Cellulitis of left lower limb: Secondary | ICD-10-CM | POA: Diagnosis not present

## 2022-05-03 MED ORDER — CEFTRIAXONE SODIUM 1 G IJ SOLR
1.0000 g | Freq: Once | INTRAMUSCULAR | Status: AC
  Administered 2022-05-03: 1 g via INTRAMUSCULAR

## 2022-05-03 MED ORDER — AMOXICILLIN-POT CLAVULANATE 875-125 MG PO TABS: 1.0000 | ORAL_TABLET | Freq: Two times a day (BID) | ORAL | 0 refills | Status: DC

## 2022-05-03 NOTE — Progress Notes (Signed)
Subjective:  Patient ID: Frank Rosales, male    DOB: [birth year]  Age: 66 y.o. MRN: [id number]  CC: Fall, Leg Swelling, and Knee Pain (Left lower leg redness and swelling and warm to touch)   HPI Frank Rosales presents for swelling, redness and pain in the left knee and leg. [place] and landed on the knee 10 days ago. No pain or swelling noted until 3-4 days ago developed pain, swelling and redness spreading down the leg from the left knee. Sx increasing daily. Painful when leg is down in seated position, but not if Frank Rosales is moving around. This has not impacted his breathing. COPD remains moderately severe, but stable. Not acutely dyspneic.      05/03/2022   10:31 AM 12/07/2021   10:06 AM 11/29/2021    9:54 AM  Depression screen PHQ 2/9  Decreased Interest 0 0 0  Down, Depressed, Hopeless 0 0 1  PHQ - 2 Score 0 0 1  Altered sleeping   1  Tired, decreased energy   1  Change in appetite   0  Feeling bad or failure about yourself    0  Trouble concentrating   0  Moving slowly or fidgety/restless   0  Suicidal thoughts   0  PHQ-9 Score   3  Difficult doing work/chores   Somewhat difficult    History Frank Rosales has a past medical history of Anxiety, Arthritis, Asthma, Atypical nevus (03/27/2011), Atypical nevus (01/20/2007), Colon polyps, COPD (chronic obstructive pulmonary disease) ([place]), Diabetes mellitus without complication ([place]), High blood pressure, High cholesterol, Hilar adenopathy, Hypothyroidism, Multiple rib fractures (08/2016), Nodular basal cell carcinoma (BCC) (02/18/2020), Nodular basal cell carcinoma (BCC) (07/04/2021), Nodular basal cell carcinoma (BCC) (07/04/2021), SCCA (squamous cell carcinoma) of skin (02/18/2020), SCCA (squamous cell carcinoma) of skin (02/18/2020), SCCA (squamous cell carcinoma) of skin (02/18/2020), Squamous cell carcinoma in situ (SCCIS) (01/09/2016), and Superficial nodular basal cell carcinoma (BCC) (01/09/2016).   Frank Rosales has a past surgical history that  includes Knee arthroscopy (Right, 05/14/2010); Wisdom tooth extraction; epidural injections; Fixation kyphoplasty lumbar spine; Back surgery; Video assisted thoracoscopy (Left, 08/22/2016); Pleural effusion drainage (Left, 08/22/2016); and Rib plating (Left, 08/22/2016).   His family history includes Diabetes in his mother; Emphysema in his father; Heart disease in his brother; Heart failure in his mother; Lung cancer in his father.Frank Rosales reports that Frank Rosales quit smoking about 4 years ago. His smoking use included cigarettes. Frank Rosales has a 67.50 pack-year smoking history. Frank Rosales has never used smokeless tobacco. Frank Rosales reports current alcohol use of about 18.0 standard drinks of alcohol per week. Frank Rosales reports that Frank Rosales does not use drugs.    ROS Review of Systems  Constitutional:  Negative for fever.  Respiratory:  Negative for shortness of breath.   Cardiovascular:  Positive for leg swelling. Negative for chest pain.  Musculoskeletal:  Negative for arthralgias.  Skin:  Positive for rash.    Objective:  BP 139/72   Pulse (!) 111   Temp 98.1 F (36.7 C)   Ht '5\' 9"'$  (1.753 m)   Wt 211 lb 6.4 oz (95.9 kg)   SpO2 91%   BMI 31.22 kg/m   BP Readings from Last 3 Encounters:  05/03/22 139/72  04/02/22 136/82  12/07/21 128/70    Wt Readings from Last 3 Encounters:  05/03/22 211 lb 6.4 oz (95.9 kg)  04/02/22 212 lb (96.2 kg)  12/07/21 215 lb (97.5 kg)     Physical Exam Vitals reviewed.  Constitutional:  Appearance: Frank Rosales is well-developed.  HENT:     Head: Normocephalic and atraumatic.     Right Ear: External ear normal.     Left Ear: External ear normal.     Mouth/Throat:     Pharynx: No oropharyngeal exudate or posterior oropharyngeal erythema.  Eyes:     Pupils: Pupils are equal, round, and reactive to light.  Cardiovascular:     Rate and Rhythm: Normal rate and regular rhythm.     Heart sounds: No murmur heard. Pulmonary:     Effort: No respiratory distress.     Breath sounds: Normal breath  sounds.  Musculoskeletal:     Cervical back: Normal range of motion and neck supple.  Skin:    General: Skin is warm and dry.     Findings: Erythema present.     Comments: Left knee has erythema, 2-3+ edema, tenderness from the anterior patella to the left ankle anteriorly. FROM at the knee.  Neurological:     Mental Status: Frank Rosales is alert and oriented to person, place, and time.      Assessment & Plan:   There are no diagnoses linked to this encounter.     I am having Frank Rosales maintain his cholecalciferol, vitamin Z-61, folic acid, Famotidine (PEPCID PO), vitamin C, albuterol, Dupixent, albuterol, Breztri Aerosphere, dapagliflozin propanediol, NIFEdipine, levothyroxine, diphenhydramine-acetaminophen, guaiFENesin, OneTouch Delica Lancets 09U, atorvastatin, olmesartan-hydrochlorothiazide, and metFORMIN.  Allergies as of 05/03/2022   No Known Allergies      Medication List        Accurate as of May 03, 2022 10:50 AM. If you have any questions, ask your nurse or doctor.          albuterol 108 (90 Base) MCG/ACT inhaler Commonly known as: ProAir HFA Inhale 2 puffs into the lungs every 6 (six) hours.   albuterol (2.5 MG/3ML) 0.083% nebulizer solution Commonly known as: PROVENTIL Take 3 mLs (2.5 mg total) by nebulization every 4 (four) hours as needed for wheezing or shortness of breath.   atorvastatin 40 MG tablet Commonly known as: LIPITOR TAKE 1 TABLET BY MOUTH DAILY FOR CHOLESTEROL   Breztri Aerosphere 160-9-4.8 MCG/ACT Aero Generic drug: Budeson-Glycopyrrol-Formoterol Inhale 2 puffs into the lungs 2 (two) times daily.   cholecalciferol 1000 units tablet Commonly known as: VITAMIN D Take 1,000 Units by mouth daily.   dapagliflozin propanediol 10 MG Tabs tablet Commonly known as: Farxiga Take 1 tablet (10 mg total) by mouth daily before breakfast.   diphenhydramine-acetaminophen 25-500 MG Tabs tablet Commonly known as: TYLENOL PM Take 1 tablet by  mouth at bedtime as needed.   Dupixent 300 MG/2ML prefilled syringe Generic drug: dupilumab Inject 300 mg into the skin every 14 (fourteen) days. Starting at day 15 for maintenance.   folic acid 1 MG tablet Commonly known as: FOLVITE Take 1 tablet (1 mg total) by mouth daily.   guaiFENesin 600 MG 12 hr tablet Commonly known as: MUCINEX Take by mouth 2 (two) times daily as needed.   levothyroxine 50 MCG tablet Commonly known as: SYNTHROID TAKE 1 TABLET BY MOUTH IN  THE MORNING BEFORE  BREAKFAST WAIT 1 HOUR AFTER TAKING TO EAT OR DRINK   metFORMIN 750 MG 24 hr tablet Commonly known as: GLUCOPHAGE-XR TAKE 2 TABLETS BY MOUTH DAILY  WITH BREAKFAST   NIFEdipine 60 MG 24 hr tablet Commonly known as: ADALAT CC TAKE 1 TABLET BY MOUTH TWICE  DAILY   olmesartan-hydrochlorothiazide 40-25 MG tablet Commonly known as: BENICAR HCT TAKE 1 TABLET BY  MOUTH DAILY   OneTouch Delica Lancets 38G Misc Check BS up to 4 x daily Dx E11.9   PEPCID PO Take 1 tablet by mouth daily.   vitamin B-12 250 MCG tablet Commonly known as: CYANOCOBALAMIN Take 250 mcg by mouth daily.   vitamin C 100 MG tablet Take 100 mg by mouth daily.         Follow-up: No follow-ups on file.  Claretta Fraise, M.D.

## 2022-06-06 DIAGNOSIS — H04123 Dry eye syndrome of bilateral lacrimal glands: Secondary | ICD-10-CM | POA: Diagnosis not present

## 2022-06-06 DIAGNOSIS — Z961 Presence of intraocular lens: Secondary | ICD-10-CM | POA: Diagnosis not present

## 2022-06-11 ENCOUNTER — Other Ambulatory Visit: Payer: Self-pay | Admitting: Family Medicine

## 2022-06-11 ENCOUNTER — Encounter: Payer: Self-pay | Admitting: Family Medicine

## 2022-06-11 DIAGNOSIS — E7800 Pure hypercholesterolemia, unspecified: Secondary | ICD-10-CM

## 2022-06-13 ENCOUNTER — Ambulatory Visit (INDEPENDENT_AMBULATORY_CARE_PROVIDER_SITE_OTHER): Payer: Medicare Other | Admitting: Family Medicine

## 2022-06-13 ENCOUNTER — Encounter: Payer: Self-pay | Admitting: Family Medicine

## 2022-06-13 VITALS — BP 138/72 | HR 112 | Temp 98.6°F | Ht 69.0 in | Wt 210.8 lb

## 2022-06-13 DIAGNOSIS — E538 Deficiency of other specified B group vitamins: Secondary | ICD-10-CM | POA: Diagnosis not present

## 2022-06-13 DIAGNOSIS — E559 Vitamin D deficiency, unspecified: Secondary | ICD-10-CM

## 2022-06-13 DIAGNOSIS — I10 Essential (primary) hypertension: Secondary | ICD-10-CM | POA: Diagnosis not present

## 2022-06-13 DIAGNOSIS — E782 Mixed hyperlipidemia: Secondary | ICD-10-CM

## 2022-06-13 DIAGNOSIS — E039 Hypothyroidism, unspecified: Secondary | ICD-10-CM

## 2022-06-13 DIAGNOSIS — E119 Type 2 diabetes mellitus without complications: Secondary | ICD-10-CM

## 2022-06-13 DIAGNOSIS — Z0001 Encounter for general adult medical examination with abnormal findings: Secondary | ICD-10-CM

## 2022-06-13 DIAGNOSIS — J449 Chronic obstructive pulmonary disease, unspecified: Secondary | ICD-10-CM

## 2022-06-13 DIAGNOSIS — M25551 Pain in right hip: Secondary | ICD-10-CM | POA: Diagnosis not present

## 2022-06-13 DIAGNOSIS — Z125 Encounter for screening for malignant neoplasm of prostate: Secondary | ICD-10-CM

## 2022-06-13 DIAGNOSIS — E7800 Pure hypercholesterolemia, unspecified: Secondary | ICD-10-CM

## 2022-06-13 DIAGNOSIS — R6889 Other general symptoms and signs: Secondary | ICD-10-CM | POA: Diagnosis not present

## 2022-06-13 DIAGNOSIS — Z0000 Encounter for general adult medical examination without abnormal findings: Secondary | ICD-10-CM | POA: Diagnosis not present

## 2022-06-13 DIAGNOSIS — F1010 Alcohol abuse, uncomplicated: Secondary | ICD-10-CM

## 2022-06-13 DIAGNOSIS — M25552 Pain in left hip: Secondary | ICD-10-CM

## 2022-06-13 LAB — URINALYSIS
Bilirubin, UA: NEGATIVE
Ketones, UA: NEGATIVE
Leukocytes,UA: NEGATIVE
Nitrite, UA: NEGATIVE
RBC, UA: NEGATIVE
Specific Gravity, UA: 1.01 (ref 1.005–1.030)
Urobilinogen, Ur: 0.2 mg/dL (ref 0.2–1.0)
pH, UA: 5.5 (ref 5.0–7.5)

## 2022-06-13 LAB — BAYER DCA HB A1C WAIVED: HB A1C (BAYER DCA - WAIVED): 5.9 % — ABNORMAL HIGH (ref 4.8–5.6)

## 2022-06-13 MED ORDER — OLMESARTAN MEDOXOMIL-HCTZ 40-25 MG PO TABS: 1.0000 | ORAL_TABLET | Freq: Every day | ORAL | 0 refills | Status: DC

## 2022-06-13 MED ORDER — METFORMIN HCL ER 750 MG PO TB24: 1500.0000 mg | ORAL_TABLET | Freq: Every day | ORAL | 0 refills | Status: DC

## 2022-06-13 MED ORDER — LEVOTHYROXINE SODIUM 50 MCG PO TABS: 50.0000 ug | ORAL_TABLET | Freq: Every day | ORAL | 3 refills | Status: DC

## 2022-06-13 MED ORDER — NIFEDIPINE ER 60 MG PO TB24: 60.0000 mg | ORAL_TABLET | Freq: Two times a day (BID) | ORAL | 3 refills | Status: DC

## 2022-06-13 NOTE — Progress Notes (Signed)
Subjective:  Patient ID: Frank Rosales, male    DOB: 25-Apr-1956  Age: 67 y.o. MRN: [id number]  CC: Annual Exam   HPI [name] presents for Annual physical. Disabled due to COPD and chronic dyspnea. Also has chronic back, hip and knee pain. Hip pain making walking difficult.       06/13/2022    9:11 AM 05/03/2022   10:31 AM 12/07/2021   10:06 AM  Depression screen PHQ 2/9  Decreased Interest 0 0 0  Down, Depressed, Hopeless 0 0 0  PHQ - 2 Score 0 0 0  presents forFollow-up of diabetes.  Patient denies symptoms such as polyuria, polydipsia, excessive hunger, nausea No significant hypoglycemic spells noted. Medications reviewed. Pt reports taking them regularly without complication/adverse reaction being reported today.  Lab Results  Component Value Date   HGBA1C 5.4 12/07/2021   HGBA1C 5.7 (H) 06/01/2021   HGBA1C 5.4 03/01/2021      History Avaneesh has a past medical history of Anxiety, Arthritis, Asthma, Atypical nevus (03/27/2011), Atypical nevus (01/20/2007), Colon polyps, COPD (chronic obstructive pulmonary disease) ([place]), Diabetes mellitus without complication ([place]), High blood pressure, High cholesterol, Hilar adenopathy, Hypothyroidism, Multiple rib fractures (08/2016), Nodular basal cell carcinoma (BCC) (02/18/2020), Nodular basal cell carcinoma (BCC) (07/04/2021), Nodular basal cell carcinoma (BCC) (07/04/2021), SCCA (squamous cell carcinoma) of skin (02/18/2020), SCCA (squamous cell carcinoma) of skin (02/18/2020), SCCA (squamous cell carcinoma) of skin (02/18/2020), Squamous cell carcinoma in situ (SCCIS) (01/09/2016), and Superficial nodular basal cell carcinoma (BCC) (01/09/2016).   He has a past surgical history that includes Knee arthroscopy (Right, 05/14/2010); Wisdom tooth extraction; epidural injections; Fixation kyphoplasty lumbar spine; Back surgery; Video assisted thoracoscopy (Left, 08/22/2016); Pleural effusion drainage (Left, 08/22/2016); and Rib plating  (Left, 08/22/2016).   His family history includes Diabetes in his mother; Emphysema in his father; Heart disease in his brother; Heart failure in his mother; Lung cancer in his father.He reports that he quit smoking about 4 years ago. His smoking use included cigarettes. He has a 67.50 pack-year smoking history. He has never used smokeless tobacco. He reports current alcohol use of about 18.0 standard drinks of alcohol per week. He reports that he does not use drugs.    ROS Review of Systems  Constitutional:  Negative for activity change, fatigue and unexpected weight change.  HENT:  Negative for congestion, ear pain, hearing loss, postnasal drip and trouble swallowing.   Eyes:  Negative for pain and visual disturbance.  Respiratory:  Negative for cough, chest tightness and shortness of breath.   Cardiovascular:  Negative for chest pain, palpitations and leg swelling.  Gastrointestinal:  Negative for abdominal distention, abdominal pain, blood in stool, constipation, diarrhea, nausea and vomiting.  Endocrine: Negative for cold intolerance, heat intolerance and polydipsia.  Genitourinary:  Positive for frequency (up every 1.5 hours at night after the first 3-4 hours). Negative for difficulty urinating, dysuria, flank pain and urgency.  Musculoskeletal:  Positive for arthralgias (bilateral hip pain. Makes walking difficult.), back pain (better since he quit working.Much less sciatica) and gait problem (due to hip pain). Negative for joint swelling.  Skin:  Negative for color change, rash and wound.  Neurological:  Negative for dizziness, syncope, speech difficulty, weakness, light-headedness, numbness and headaches.  Hematological:  Does not bruise/bleed easily.  Psychiatric/Behavioral:  Negative for confusion, decreased concentration, dysphoric mood and sleep disturbance. The patient is not nervous/anxious.     Objective:  BP 138/72   Pulse (!) 112   Temp 98.6 F (37  C)   Ht '5\' 9"'$  (1.753 m)    Wt 210 lb 12.8 oz (95.6 kg)   SpO2 92%   BMI 31.13 kg/m   BP Readings from Last 3 Encounters:  06/13/22 138/72  05/03/22 139/72  04/02/22 136/82    Wt Readings from Last 3 Encounters:  06/13/22 210 lb 12.8 oz (95.6 kg)  05/03/22 211 lb 6.4 oz (95.9 kg)  04/02/22 212 lb (96.2 kg)     Physical Exam Constitutional:      Appearance: He is well-developed.  HENT:     Head: Normocephalic and atraumatic.  Eyes:     Pupils: Pupils are equal, round, and reactive to light.  Neck:     Thyroid: No thyromegaly.     Trachea: No tracheal deviation.  Cardiovascular:     Rate and Rhythm: Normal rate and regular rhythm.     Heart sounds: Normal heart sounds. No murmur heard.    No friction rub. No gallop.  Pulmonary:     Breath sounds: Normal breath sounds. No wheezing or rales.  Abdominal:     General: Bowel sounds are normal. There is no distension.     Palpations: Abdomen is soft. There is no mass.     Tenderness: There is no abdominal tenderness.     Hernia: There is no hernia in the left inguinal area.  Genitourinary:    Penis: Normal.      Testes: Normal.  Musculoskeletal:        General: Normal range of motion.     Cervical back: Normal range of motion.  Lymphadenopathy:     Cervical: No cervical adenopathy.  Skin:    General: Skin is warm and dry.  Neurological:     Mental Status: He is alert and oriented to person, place, and time.      Assessment & Plan:   Hazen was seen today for annual exam.  Diagnoses and all orders for this visit:  Mixed hyperlipidemia -     Lipid panel  Hypothyroidism, unspecified type -     TSH -     levothyroxine (SYNTHROID) 50 MCG tablet; Take 1 tablet (50 mcg total) by mouth daily before breakfast. -     T4, Free  Essential hypertension -     CBC with Differential/Platelet -     CMP14+EGFR -     NIFEdipine (ADALAT CC) 60 MG 24 hr tablet; Take 1 tablet (60 mg total) by mouth 2 (two) times daily.  Diabetes mellitus, new  onset ([place]) -     Bayer DCA Hb A1c Waived -     Microalbumin / creatinine urine ratio  Screening for prostate cancer  Well adult exam -     Urinalysis -     VITAMIN D 25 Hydroxy (Vit-D Deficiency, Fractures)  COPD GOLD III   High cholesterol  Alcohol abuse  Vitamin D deficiency  Vitamin B12 deficiency -     Vitamin B12  Bilateral hip pain -     Ambulatory referral to Orthopedics  Other orders -     metFORMIN (GLUCOPHAGE-XR) 750 MG 24 hr tablet; Take 2 tablets (1,500 mg total) by mouth daily with breakfast. -     olmesartan-hydrochlorothiazide (BENICAR HCT) 40-25 MG tablet; Take 1 tablet by mouth daily.       I have discontinued Josearmando A. Livas's amoxicillin-clavulanate. I have also changed his levothyroxine, metFORMIN, and NIFEdipine. Additionally, I am having him maintain his cholecalciferol, vitamin X-54, folic acid, Famotidine (PEPCID PO), vitamin C,  albuterol, Dupixent, albuterol, Breztri Aerosphere, dapagliflozin propanediol, diphenhydramine-acetaminophen, guaiFENesin, OneTouch Delica Lancets 02H, atorvastatin, and olmesartan-hydrochlorothiazide.  Allergies as of 06/13/2022   No Known Allergies      Medication List        Accurate as of June 13, 2022  9:51 AM. If you have any questions, ask your nurse or doctor.          STOP taking these medications    amoxicillin-clavulanate 875-125 MG tablet Commonly known as: AUGMENTIN Stopped by: Claretta Fraise, MD       TAKE these medications    albuterol 108 (90 Base) MCG/ACT inhaler Commonly known as: ProAir HFA Inhale 2 puffs into the lungs every 6 (six) hours.   albuterol (2.5 MG/3ML) 0.083% nebulizer solution Commonly known as: PROVENTIL Take 3 mLs (2.5 mg total) by nebulization every 4 (four) hours as needed for wheezing or shortness of breath.   atorvastatin 40 MG tablet Commonly known as: LIPITOR TAKE 1 TABLET BY MOUTH DAILY FOR CHOLESTEROL   Breztri Aerosphere 160-9-4.8 MCG/ACT  Aero Generic drug: Budeson-Glycopyrrol-Formoterol Inhale 2 puffs into the lungs 2 (two) times daily.   cholecalciferol 1000 units tablet Commonly known as: VITAMIN D Take 1,000 Units by mouth daily.   dapagliflozin propanediol 10 MG Tabs tablet Commonly known as: Farxiga Take 1 tablet (10 mg total) by mouth daily before breakfast.   diphenhydramine-acetaminophen 25-500 MG Tabs tablet Commonly known as: TYLENOL PM Take 1 tablet by mouth at bedtime as needed.   Dupixent 300 MG/2ML prefilled syringe Generic drug: dupilumab Inject 300 mg into the skin every 14 (fourteen) days. Starting at day 15 for maintenance.   folic acid 1 MG tablet Commonly known as: FOLVITE Take 1 tablet (1 mg total) by mouth daily.   guaiFENesin 600 MG 12 hr tablet Commonly known as: MUCINEX Take by mouth 2 (two) times daily as needed.   levothyroxine 50 MCG tablet Commonly known as: SYNTHROID Take 1 tablet (50 mcg total) by mouth daily before breakfast. What changed: See the new instructions. Changed by: Claretta Fraise, MD   metFORMIN 750 MG 24 hr tablet Commonly known as: GLUCOPHAGE-XR Take 2 tablets (1,500 mg total) by mouth daily with breakfast.   NIFEdipine 60 MG 24 hr tablet Commonly known as: ADALAT CC Take 1 tablet (60 mg total) by mouth 2 (two) times daily.   olmesartan-hydrochlorothiazide 40-25 MG tablet Commonly known as: BENICAR HCT Take 1 tablet by mouth daily.   OneTouch Delica Lancets 85I Misc Check BS up to 4 x daily Dx E11.9   PEPCID PO Take 1 tablet by mouth daily.   vitamin B-12 250 MCG tablet Commonly known as: CYANOCOBALAMIN Take 250 mcg by mouth daily.   vitamin C 100 MG tablet Take 100 mg by mouth daily.         Follow-up: No follow-ups on file.  Claretta Fraise, M.D.

## 2022-06-14 LAB — CBC WITH DIFFERENTIAL/PLATELET
Basophils Absolute: 0.1 10*3/uL (ref 0.0–0.2)
Basos: 1 %
EOS (ABSOLUTE): 0.1 10*3/uL (ref 0.0–0.4)
Eos: 2 %
Hematocrit: 51.6 % — ABNORMAL HIGH (ref 37.5–51.0)
Hemoglobin: 17.9 g/dL — ABNORMAL HIGH (ref 13.0–17.7)
Immature Grans (Abs): 0 10*3/uL (ref 0.0–0.1)
Immature Granulocytes: 0 %
Lymphocytes Absolute: 1.5 10*3/uL (ref 0.7–3.1)
Lymphs: 17 %
MCH: 35.7 pg — ABNORMAL HIGH (ref 26.6–33.0)
MCHC: 34.7 g/dL (ref 31.5–35.7)
MCV: 103 fL — ABNORMAL HIGH (ref 79–97)
Monocytes Absolute: 0.9 10*3/uL (ref 0.1–0.9)
Monocytes: 10 %
Neutrophils Absolute: 6.3 10*3/uL (ref 1.4–7.0)
Neutrophils: 70 %
Platelets: 282 10*3/uL (ref 150–450)
RBC: 5.01 x10E6/uL (ref 4.14–5.80)
RDW: 12.3 % (ref 11.6–15.4)
WBC: 8.9 10*3/uL (ref 3.4–10.8)

## 2022-06-14 LAB — CMP14+EGFR
ALT: 39 IU/L (ref 0–44)
AST: 36 IU/L (ref 0–40)
Albumin/Globulin Ratio: 1.6 (ref 1.2–2.2)
Albumin: 4.8 g/dL (ref 3.9–4.9)
Alkaline Phosphatase: 112 IU/L (ref 44–121)
BUN/Creatinine Ratio: 10 (ref 10–24)
BUN: 11 mg/dL (ref 8–27)
Bilirubin Total: 0.7 mg/dL (ref 0.0–1.2)
CO2: 22 mmol/L (ref 20–29)
Calcium: 10.3 mg/dL — ABNORMAL HIGH (ref 8.6–10.2)
Chloride: 90 mmol/L — ABNORMAL LOW (ref 96–106)
Creatinine, Ser: 1.1 mg/dL (ref 0.76–1.27)
Globulin, Total: 3 g/dL (ref 1.5–4.5)
Glucose: 133 mg/dL — ABNORMAL HIGH (ref 70–99)
Potassium: 4.5 mmol/L (ref 3.5–5.2)
Sodium: 131 mmol/L — ABNORMAL LOW (ref 134–144)
Total Protein: 7.8 g/dL (ref 6.0–8.5)
eGFR: 75 mL/min/{1.73_m2} (ref >59)

## 2022-06-14 LAB — LIPID PANEL
Chol/HDL Ratio: 3 ratio (ref 0.0–5.0)
Cholesterol, Total: 233 mg/dL — ABNORMAL HIGH (ref 100–199)
HDL: 78 mg/dL (ref >39)
LDL Chol Calc (NIH): 137 mg/dL — ABNORMAL HIGH (ref 0–99)
Triglycerides: 102 mg/dL (ref 0–149)
VLDL Cholesterol Cal: 18 mg/dL (ref 5–40)

## 2022-06-14 LAB — TSH: TSH: 4.26 u[IU]/mL (ref 0.450–4.500)

## 2022-06-14 LAB — VITAMIN D 25 HYDROXY (VIT D DEFICIENCY, FRACTURES): Vit D, 25-Hydroxy: 62.4 ng/mL (ref 30.0–100.0)

## 2022-06-15 LAB — MICROALBUMIN / CREATININE URINE RATIO
Creatinine, Urine: 69.3 mg/dL
Microalb/Creat Ratio: 1103 mg/g creat — ABNORMAL HIGH (ref 0–29)
Microalbumin, Urine: 764.3 ug/mL

## 2022-06-16 LAB — VITAMIN B12: Vitamin B-12: 768 pg/mL (ref 232–1245)

## 2022-06-16 LAB — T4, FREE: Free T4: 1.54 ng/dL (ref 0.82–1.77)

## 2022-06-16 LAB — SPECIMEN STATUS REPORT

## 2022-06-17 ENCOUNTER — Other Ambulatory Visit: Payer: Self-pay | Admitting: Family Medicine

## 2022-06-19 ENCOUNTER — Telehealth: Payer: Self-pay | Admitting: Family Medicine

## 2022-06-19 ENCOUNTER — Other Ambulatory Visit: Payer: Self-pay | Admitting: Family Medicine

## 2022-06-19 MED ORDER — TAMSULOSIN HCL 0.4 MG PO CAPS: 0.8000 mg | ORAL_CAPSULE | Freq: Every day | ORAL | 3 refills | Status: DC

## 2022-06-19 NOTE — Telephone Encounter (Signed)
Please let the patient know that I sent their prescription to their pharmacy. Thanks, WS 

## 2022-06-19 NOTE — Telephone Encounter (Signed)
Left detailed message per signed DPR. Encouraged call back if there are any questions.

## 2022-06-19 NOTE — Telephone Encounter (Signed)
Please advise 

## 2022-07-04 ENCOUNTER — Ambulatory Visit: Payer: Medicare Other | Admitting: Dermatology

## 2022-07-23 ENCOUNTER — Telehealth: Payer: Self-pay | Admitting: Internal Medicine

## 2022-07-23 DIAGNOSIS — M25551 Pain in right hip: Secondary | ICD-10-CM | POA: Diagnosis not present

## 2022-07-23 DIAGNOSIS — M1611 Unilateral primary osteoarthritis, right hip: Secondary | ICD-10-CM | POA: Diagnosis not present

## 2022-07-23 DIAGNOSIS — M25552 Pain in left hip: Secondary | ICD-10-CM | POA: Diagnosis not present

## 2022-07-23 DIAGNOSIS — M545 Low back pain, unspecified: Secondary | ICD-10-CM | POA: Diagnosis not present

## 2022-07-23 NOTE — Telephone Encounter (Signed)
Pt. Calling to get surg. Clearance for right hip surgery

## 2022-07-24 NOTE — Telephone Encounter (Signed)
Can you call this patient back since you spoke to him and get him SET up for surgical clearance visit with Dr Melvyn Novas or an NP  Thank you

## 2022-07-26 ENCOUNTER — Encounter: Payer: Self-pay | Admitting: Internal Medicine

## 2022-07-26 ENCOUNTER — Ambulatory Visit: Payer: Medicare Other | Admitting: Internal Medicine

## 2022-07-26 VITALS — BP 136/80 | HR 102 | Ht 69.0 in | Wt 210.4 lb

## 2022-07-26 DIAGNOSIS — J449 Chronic obstructive pulmonary disease, unspecified: Secondary | ICD-10-CM

## 2022-07-26 NOTE — Progress Notes (Signed)
Subjective:   Patient ID: Frank Rosales, male    DOB: June 29, 1955    MRN: [id number]    Brief patient profile:  49  yowm/MM "quit smoking"  11/2017  with h/o exposure to Asbestosis at [place] ? Into the 80s  still able to walk  flat fine but steps x 3 flights sob with GOLD III copd 2013 prev eval by Dr Joya Gaskins  / gets annual f/u in [place]    History of Present Illness  02/16/2016  f/u ov/Frank Rosales re:  GOLD III copd /  symbicort 160 2bid / twice weekly saba  Chief Complaint  Patient presents with   Follow-up    Pt states had CT Chest done 12/27/15- ordered by worker's comp for eval of previous asbestos exp. He states he had been doing well until July 2017 developed cough and congestion that lasted 2 months, but starting to improve.   sick July - September 2017 rx with abx/ steroids better since early Sept 2017 but CT was done in middle of this illness and was abnormal but was done for purpose of screening for asbestos  rec Plan A = Automatic = symbicort 160 Take 2 puffs first thing in am and then another 2 puffs about 12 hours later.  Work on inhaler technique:   Only use your albuterol(proair)  as a rescue medication  The key is to stop smoking completely before smoking completely stops you!       04/03/2021  f/u ov/[place] office/Frank Rosales re: GOLD 3 maint on breztri / dupixent  and hs pepcid  Chief Complaint  Patient presents with   Follow-up    Coughing up green mucus, chest and throat are hurting. Increased in SOB. Has taken dayquil and mucinex since Tues. 03-28-21 after taking booster. No fever. Neg. Covid test yesterday.    Dyspnea:  able to walk to shop "just barely" at baseline  Cough: worse than usual 03/28/21 when got bivalent vaccine / neg covid Ag 04/02/21 Sleeping: flat bed / 2pillows   SABA use: just using hfa at baseline, even when sick just hfa  02: none  Covid status: vax x 5  Rec Plan A = Automatic = Always=    Breztri Take 2 puffs first thing in am and then  another 2 puffs about 12 hours later.   Plan B = Backup (to supplement plan A, not to replace it) Only use your albuterol inhaler as a rescue medication  Plan C = Crisis (instead of Plan B but only if Plan B stops working) - only use your albuterol nebulizer if you first try Plan B and it fails to help > ok to use the nebulizer up to every 4 hours but if start needing it regularly call for immediate appointment For flares of coughing: Immediately start protonix 40 mg Take 30-60 min before first meal of the day until cough is better for a week and no longer need max dose of mucinex dm 1200  mg every 12 hours  Zpak  Prednisone 10 mg take  4 each am x 2 days,   2 each am x 2 days,  1 each am x 2 days and stop  Depomedrol 80 mg IM today  Please schedule a follow up visit in 6 months but call sooner if needed       09/29/2021  f/u ov/[place] office/Frank Rosales re: GOLD 3  maint on breztri/dupixent and pepcid each evening   Chief Complaint  Patient presents with   Follow-up  Cough and chest congestion have cleared up   Dyspnea:  shop and back s stopping  Cough: better  Sleeping: ok flat bed / 2 pillows  SABA use: not needing  02: none  Covid status: vax x 5  Lung cancer screening done annually in [place]  Rec My office will be contacting you by phone for referral to ENT for hoarseness > [place] 01/25/22 GERD      07/26/2022  f/u ov/[place] office/Frank Rosales re: GOLD 3  maint on breztri/ dupixent   Chief Complaint  Patient presents with   Follow-up    Surgery clearance for hip replacement   Dyspnea:  more limited now by back /hips Cough: min am mucoid  Sleeping: flat bed/ 2 pillows SABA use: none needed  Lung cancer screening:  q August    No obvious day to day or daytime variability or assoc excess/ purulent sputum or mucus plugs or hemoptysis or cp or chest tightness, subjective wheeze or overt sinus or hb symptoms.    Also denies any obvious fluctuation of symptoms with weather or  environmental changes or other aggravating or alleviating factors except as outlined above   No unusual exposure hx or h/o childhood pna/ asthma or knowledge of premature birth.  Current Allergies, Complete Past Medical History, Past Surgical History, Family History, and Social History were reviewed in [employer] record.  ROS  The following are not active complaints unless bolded Hoarseness, sore throat, dysphagia, dental problems, itching, sneezing,  nasal congestion or discharge of excess mucus or purulent secretions, ear ache,   fever, chills, sweats, unintended wt loss or wt gain, classically pleuritic or exertional cp,  orthopnea pnd or arm/hand swelling  or leg swelling, presyncope, palpitations, abdominal pain, anorexia, nausea, vomiting, diarrhea  or change in bowel habits or change in bladder habits, change in stools or change in urine, dysuria, hematuria,  rash, arthralgias, visual complaints, headache, numbness, weakness or ataxia or problems with walking or coordination,  change in mood or  memory.        Current Meds  Medication Sig   albuterol (PROAIR HFA) 108 (90 Base) MCG/ACT inhaler Inhale 2 puffs into the lungs every 6 (six) hours.   albuterol (PROVENTIL) (2.5 MG/3ML) 0.083% nebulizer solution Take 3 mLs (2.5 mg total) by nebulization every 4 (four) hours as needed for wheezing or shortness of breath.   Ascorbic Acid (VITAMIN C) 100 MG tablet Take 100 mg by mouth daily.   atorvastatin (LIPITOR) 40 MG tablet TAKE 1 TABLET BY MOUTH DAILY FOR CHOLESTEROL   Budeson-Glycopyrrol-Formoterol (BREZTRI AEROSPHERE) 160-9-4.8 MCG/ACT AERO Inhale 2 puffs into the lungs 2 (two) times daily.   cholecalciferol (VITAMIN D) 1000 UNITS tablet Take 1,000 Units by mouth daily.    dapagliflozin propanediol (FARXIGA) 10 MG TABS tablet Take 1 tablet (10 mg total) by mouth daily before breakfast.   diphenhydramine-acetaminophen (TYLENOL PM) 25-500 MG TABS tablet Take 1 tablet by  mouth at bedtime as needed.   dupilumab (DUPIXENT) 300 MG/2ML prefilled syringe Inject 300 mg into the skin every 14 (fourteen) days. Starting at day 15 for maintenance.   Famotidine (PEPCID PO) Take 1 tablet by mouth daily.   folic acid (FOLVITE) 1 MG tablet Take 1 tablet (1 mg total) by mouth daily.   guaiFENesin (MUCINEX) 600 MG 12 hr tablet Take by mouth 2 (two) times daily as needed.   levothyroxine (SYNTHROID) 50 MCG tablet Take 1 tablet (50 mcg total) by mouth daily before breakfast.   metFORMIN (GLUCOPHAGE-XR)  750 MG 24 hr tablet Take 2 tablets (1,500 mg total) by mouth daily with breakfast.   NIFEdipine (ADALAT CC) 60 MG 24 hr tablet Take 1 tablet (60 mg total) by mouth 2 (two) times daily.   olmesartan-hydrochlorothiazide (BENICAR HCT) 40-25 MG tablet Take 1 tablet by mouth daily.   OneTouch Delica Lancets 99991111 MISC Check BS up to 4 x daily Dx E11.9   tamsulosin (FLOMAX) 0.4 MG CAPS capsule Take 2 capsules (0.8 mg total) by mouth at bedtime. For urine flow and prostate   vitamin B-12 (CYANOCOBALAMIN) 250 MCG tablet Take 250 mcg by mouth daily.                    Objective:   Physical Exam  Wts  07/26/2022    210 04/02/2022  212 09/29/2021    212 04/03/2021  203  09/16/2020      222 03/03/2020  219  02/01/2020    217 10/05/2019    228 03/31/2019  213  01/24/2015    180 > 02/21/2015  180  > 06/16/2015  186  > 02/16/2016  205 > 09/13/2016   185 >  11/07/2016  187 >  12/21/2016  195 > 03/25/2017   200 > 06/25/2017   198  > 12/23/2017   199  > 06/13/2018  200      11/10/14 174 lb (78.926 kg)  11/08/14 176 lb 6.4 oz (80.015 kg)  10/27/14 175 lb (79.379 kg)    Vital signs reviewed  07/26/2022  - Note at rest 02 sats  92% on RA   General appearance:    amb wm/awkward gait/ gruff voice   HEENT : Oropharynx  clear   Nasal turbinates nl    NECK :  without  apparent JVD/ palpable Nodes/TM    LUNGS: no acc muscle use,  Mild barrel  contour chest wall with bilateral  Distant bs s audible  wheeze and  without cough on insp or exp maneuvers  and mild  Hyperresonant  to  percussion bilaterally     CV:  RRR  no s3 or murmur or increase in P2, and no edema   ABD:  obese soft and nontender with pos end  insp Hoover's  in the supine position.  No bruits or organomegaly appreciated   MS:  penguin pattern walking/ ext warm without deformities Or obvious joint restrictions  calf tenderness, cyanosis or clubbing     SKIN: warm and dry without lesions    NEURO:  alert, approp, nl sensorium with  no motor or cerebellar deficits apparent.         Assessment & Plan:

## 2022-07-26 NOTE — Patient Instructions (Signed)
No change in medications   You are cleared for back surgery from a pulmonary perspective  Please schedule a follow up visit in 6  months but call sooner if needed

## 2022-07-26 NOTE — Progress Notes (Signed)
Subjective:   Patient ID: Frank Rosales, male    DOB: 11-07-1955    MRN: [id number]    Brief patient profile:  75  yowm/MM "quit smoking"  11/2017  with h/o exposure to Asbestosis at [place] ? Into the 80s  still able to walk  flat fine but steps x 3 flights sob with GOLD III copd 2013 prev eval by Dr Joya Gaskins  / gets annual f/u in [place]     History of Present Illness  02/16/2016  f/u ov/Larenda Reedy re:  GOLD III copd /  symbicort 160 2bid / twice weekly saba  Chief Complaint  Patient presents with   Follow-up    Pt states had CT Chest done 12/27/15- ordered by worker's comp for eval of previous asbestos exp. He states he had been doing well until July 2017 developed cough and congestion that lasted 2 months, but starting to improve.   sick July - September 2017 rx with abx/ steroids better since early Sept 2017 but CT was done in middle of this illness and was abnormal but was done for purpose of screening for asbestos  rec Plan A = Automatic = symbicort 160 Take 2 puffs first thing in am and then another 2 puffs about 12 hours later.  Work on inhaler technique:   Only use your albuterol(proair)  as a rescue medication  The key is to stop smoking completely before smoking completely stops you!       02/01/2020  f/u ov/[place] office/Trent Theisen re: GOLD III copd maint on symbicort/ dupixent per derm Chief Complaint  Patient presents with   Follow-up    Patient feels like his breathing has got worse since last visit. Struggles to breath with any exertion. Cough in morning with clear sputum.  Dyspnea:  Variable doe/ worse in shower / also limited by back pain which is chronic  Walks to shop x 50 yards slt hill coming back to house  Cough: some in am better p coffee  Sleeping: bed is flat/ two pillows  SABA use: once a week 02: none rec Try albuterol 15 min before an activity that you know would make you short of breath and see if it makes any difference and if makes none then don't take  it after activity unless you can't catch your breath.  Plan A = Automatic = Always=    Change to Breztri Take 2 puffs first thing in am and then another 2 puffs about 12 hours later.  Plan B = Backup (to supplement plan A, not to replace it) Only use your albuterol inhaler as a rescue medication Plan C = Crisis (instead of Plan B but only if Plan B stops working) - only use your albuterol nebulizer if you first try Plan B and it fails to help > ok to use the nebulizer up to every 4 hours but if start needing it regularly call for immediate appointment         04/03/2021  f/u ov/[place] office/Luzmaria Devaux re: GOLD 3 maint on breztri / dupixent  and hs pepcid  Chief Complaint  Patient presents with   Follow-up    Coughing up green mucus, chest and throat are hurting. Increased in SOB. Has taken dayquil and mucinex since Tues. 03-28-21 after taking booster. No fever. Neg. Covid test yesterday.    Dyspnea:  able to walk to shop "just barely" at baseline  Cough: worse than usual 03/28/21 when got bivalent vaccine / neg covid Ag 04/02/21 Sleeping: flat bed /  2pillows   SABA use: just using hfa at baseline, even when sick just hfa  02: none  Covid status: vax x 5  Rec Plan A = Automatic = Always=    Breztri Take 2 puffs first thing in am and then another 2 puffs about 12 hours later.   Plan B = Backup (to supplement plan A, not to replace it) Only use your albuterol inhaler as a rescue medication  Plan C = Crisis (instead of Plan B but only if Plan B stops working) - only use your albuterol nebulizer if you first try Plan B and it fails to help > ok to use the nebulizer up to every 4 hours but if start needing it regularly call for immediate appointment For flares of coughing: Immediately start protonix 40 mg Take 30-60 min before first meal of the day until cough is better for a week and no longer need max dose of mucinex dm 1200  mg every 12 hours  Zpak  Prednisone 10 mg take  4 each am x 2  days,   2 each am x 2 days,  1 each am x 2 days and stop  Depomedrol 80 mg IM today  Please schedule a follow up visit in 6 months but call sooner if needed       09/29/2021  f/u ov/[place] office/Ivana Nicastro re: GOLD 3  maint on breztri/dupixent and pepcid each evening   Chief Complaint  Patient presents with   Follow-up    Cough and chest congestion have cleared up   Dyspnea:  shop and back s stopping  Cough: better  Sleeping: ok flat bed / 2 pillows  SABA use: not needing  02: none  Covid status: vax x 5  Lung cancer screening done annually in [place]  Rec My office will be contacting you by phone for referral to ENT for hoarseness > [place] 01/25/22 GERD      07/26/2022  f/u ov/[place] office/Glorine Hanratty re: GOLD 3 maint on breztri   Chief Complaint  Patient presents with   Follow-up    Surgery clearance for hip replacement   Dyspnea:  stopped walking to shop due to hips/back  hurting x Nov 2023   Cough: slt yellow 1st thing in am  Sleeping: flat bed/ 2 pillows  SABA use: none 02: none  Lung cancer screening:  [place] q August    No obvious day to day or daytime variability or assoc excess/ purulent sputum or mucus plugs or hemoptysis or cp or chest tightness, subjective wheeze or overt sinus or hb symptoms.   Sleeping flat without nocturnal  or early am exacerbation  of respiratory  c/o's or need for noct saba. Also denies any obvious fluctuation of symptoms with weather or environmental changes or other aggravating or alleviating factors except as outlined above   No unusual exposure hx or h/o childhood pna/ asthma or knowledge of premature birth.  Current Allergies, Complete Past Medical History, Past Surgical History, Family History, and Social History were reviewed in [employer] record.  ROS  The following are not active complaints unless bolded Hoarseness, sore throat, dysphagia, dental problems, itching, sneezing,  nasal congestion or discharge  of excess mucus or purulent secretions, ear ache,   fever, chills, sweats, unintended wt loss or wt gain, classically pleuritic or exertional cp,  orthopnea pnd or arm/hand swelling  or leg swelling, presyncope, palpitations, abdominal pain, anorexia, nausea, vomiting, diarrhea  or change in bowel habits or change in bladder  habits, change in stools or change in urine, dysuria, hematuria,  rash, arthralgias, visual complaints, headache, numbness, weakness or ataxia or problems with walking or coordination,  change in mood or  memory.        Current Meds  Medication Sig   albuterol (PROAIR HFA) 108 (90 Base) MCG/ACT inhaler Inhale 2 puffs into the lungs every 6 (six) hours.   albuterol (PROVENTIL) (2.5 MG/3ML) 0.083% nebulizer solution Take 3 mLs (2.5 mg total) by nebulization every 4 (four) hours as needed for wheezing or shortness of breath.   Ascorbic Acid (VITAMIN C) 100 MG tablet Take 100 mg by mouth daily.   atorvastatin (LIPITOR) 40 MG tablet TAKE 1 TABLET BY MOUTH DAILY FOR CHOLESTEROL   Budeson-Glycopyrrol-Formoterol (BREZTRI AEROSPHERE) 160-9-4.8 MCG/ACT AERO Inhale 2 puffs into the lungs 2 (two) times daily.   cholecalciferol (VITAMIN D) 1000 UNITS tablet Take 1,000 Units by mouth daily.    dapagliflozin propanediol (FARXIGA) 10 MG TABS tablet Take 1 tablet (10 mg total) by mouth daily before breakfast.   diphenhydramine-acetaminophen (TYLENOL PM) 25-500 MG TABS tablet Take 1 tablet by mouth at bedtime as needed.   dupilumab (DUPIXENT) 300 MG/2ML prefilled syringe Inject 300 mg into the skin every 14 (fourteen) days. Starting at day 15 for maintenance.   Famotidine (PEPCID PO) Take 1 tablet by mouth daily.   folic acid (FOLVITE) 1 MG tablet Take 1 tablet (1 mg total) by mouth daily.   guaiFENesin (MUCINEX) 600 MG 12 hr tablet Take by mouth 2 (two) times daily as needed.   levothyroxine (SYNTHROID) 50 MCG tablet Take 1 tablet (50 mcg total) by mouth daily before breakfast.   metFORMIN  (GLUCOPHAGE-XR) 750 MG 24 hr tablet Take 2 tablets (1,500 mg total) by mouth daily with breakfast.   NIFEdipine (ADALAT CC) 60 MG 24 hr tablet Take 1 tablet (60 mg total) by mouth 2 (two) times daily.   olmesartan-hydrochlorothiazide (BENICAR HCT) 40-25 MG tablet Take 1 tablet by mouth daily.   OneTouch Delica Lancets 99991111 MISC Check BS up to 4 x daily Dx E11.9   tamsulosin (FLOMAX) 0.4 MG CAPS capsule Take 2 capsules (0.8 mg total) by mouth at bedtime. For urine flow and prostate   vitamin B-12 (CYANOCOBALAMIN) 250 MCG tablet Take 250 mcg by mouth daily.                     Objective:   Physical Exam  Wts  07/26/2022    210  04/02/2022  212 09/29/2021    212 04/03/2021  203  09/16/2020      222 03/03/2020  219  02/01/2020    217 10/05/2019    228 03/31/2019  213  01/24/2015    180 > 02/21/2015  180  > 06/16/2015  186  > 02/16/2016  205 > 09/13/2016   185 >  11/07/2016  187 >  12/21/2016  195 > 03/25/2017   200 > 06/25/2017   198  > 12/23/2017   199  > 06/13/2018  200      11/10/14 174 lb (78.926 kg)  11/08/14 176 lb 6.4 oz (80.015 kg)  10/27/14 175 lb (79.379 kg)   Vital signs reviewed  07/26/2022  - Note at rest 02 sats  92% on RA   General appearance:    hoarse amb wm nad  HEENT : Oropharynx  clear       NECK :  without  apparent JVD/ palpable Nodes/TM    LUNGS: no acc muscle  use,  Mild barrel  contour chest wall with bilateral  Distant bs s audible wheeze and  without cough on insp or exp maneuvers  and mild  Hyperresonant  to  percussion bilaterally     CV:  RRR  no s3 or murmur or increase in P2, and no edema   ABD:  obese soft and nontender with pos end  insp Hoover's  in the supine position.  No bruits or organomegaly appreciated   MS:  Nl gait/ ext warm without deformities Or obvious joint restrictions  calf tenderness, cyanosis or clubbing     SKIN: warm and dry without lesions    NEURO:  alert, approp, nl sensorium with  no motor or cerebellar deficits apparent.                 Assessment & Plan:

## 2022-07-26 NOTE — Assessment & Plan Note (Addendum)
Quit smoking MM Spirometry 11/2017  03/2011 FeV1 49%    Spirometry  02/28/2012  FEV1 45%   - Last day worked = October 13 2014  - PFT's  01/24/2015  FEV1 1.59  (43 % ) ratio 54   p 22 % improvement from saba with DLCO  88 % corrects to 99 % for alv volume s symbicort  - PFTs  12/27/15 1.30 (35%) ratio 35  In midst of a flare- - PFT's  12/21/2016  FEV1 1.40 (39 % ) ratio 50  p 8 % improvement from saba p symbicort 160 x 2  prior to study with DLCO  71/69 % corrects to 85  % for alv volume - PFTS  05/16/17  FEV1  1.32( 37%)  Ratio 37  And DLCO  52% > corrects to 78%  03/25/2017  try bevespi > preferred symb 160  - 06/13/2018    try adding spiriva 2.5 x 2 each am sample  > no better so did not fill rx  - 03/31/2019   Walked RA x one lap =  approx 250 ft @ mod pace - stopped due to sob with sats of 92% at the end of the study. - 02/01/2020  After extensive coaching inhaler device,  effectiveness =  90%  - alpha one AT def screen  02/02/20   MM  Level 151  -  09/16/2020   Walked RA   Moderate pace walk total 300 feet feeling short of breath at 200 feet but sats still 98% at end typical of a pink puffer - PFT's  01/01/22   FEV1 1.46(42 % ) ratio 0.42  p 0 % improvement from saba p ? prior to study with DLCO  19/7 (61%)  and FV curve classically concave  - 07/26/2022   Walked on RA   x  2  lap(s) =  approx 300  ft  @ slow/penguin gait   stopped due to sob  with lowest 02 sats 92%        Group D (now reclassified as E) in terms of symptom/risk and laba/lama/ICS  therefore appropriate rx at this point >>>  Breztri and approp saba   Concerned progressive decline related to back pain/ deconditioning  He is clear on exam today and acceptable risk for back surgery unless aecopd in meantime.   F/u q 6 months, sooner if needed   Each maintenance medication was reviewed in detail including emphasizing most importantly the difference between maintenance and prns and under what circumstances the prns are to be triggered  using an action plan format where appropriate.  Total time for H and P, chart review, counseling, reviewing hfa/ neb device(s) , directly observing portions of ambulatory 02 saturation study/ and generating customized AVS unique to this office visit / same day charting = 34 min preop eval

## 2022-08-01 ENCOUNTER — Telehealth: Payer: Self-pay | Admitting: Family Medicine

## 2022-08-01 NOTE — Telephone Encounter (Signed)
Patient called back to check on appt. Made him aware that he would need an appt and offered for the first available opening with PCP which was on 4/3 but he said that he already has another appt on that day. His surgery has been scheduled for 4/9 so he needs it before then. Asked to be worked in next week if possible. Please call back and advise.

## 2022-08-01 NOTE — Telephone Encounter (Signed)
Yes please schedule. 

## 2022-08-01 NOTE — Telephone Encounter (Signed)
I scheduled him for Monday at 3:55, please let him know

## 2022-08-02 NOTE — Telephone Encounter (Signed)
Called pt about appt 

## 2022-08-06 ENCOUNTER — Encounter: Payer: Self-pay | Admitting: Family Medicine

## 2022-08-06 ENCOUNTER — Ambulatory Visit (INDEPENDENT_AMBULATORY_CARE_PROVIDER_SITE_OTHER): Payer: Medicare Other | Admitting: Family Medicine

## 2022-08-06 VITALS — BP 139/75 | HR 104 | Temp 98.4°F | Ht 69.0 in | Wt 209.0 lb

## 2022-08-06 DIAGNOSIS — Z01818 Encounter for other preprocedural examination: Secondary | ICD-10-CM

## 2022-08-06 DIAGNOSIS — I4510 Unspecified right bundle-branch block: Secondary | ICD-10-CM | POA: Diagnosis not present

## 2022-08-06 DIAGNOSIS — I517 Cardiomegaly: Secondary | ICD-10-CM | POA: Diagnosis not present

## 2022-08-06 NOTE — Progress Notes (Signed)
Subjective:  Patient ID: Frank Rosales, male    DOB: 1955/07/26  Age: 67 y.o. MRN: [id number]  CC: Surgical Clearance   HPI Frank Rosales presents for Surgical clearance for hip replacement surgery under spinal anesthesia. The procedure is planned for April 9. He has been cleared by pulmonary already.   Today he reports baseline dyspnea. Denies chest pain, palpitations. He has painful ambulation due to the hip in question.      08/06/2022    3:50 PM 06/13/2022    9:11 AM 05/03/2022   10:31 AM  Depression screen PHQ 2/9  Decreased Interest 0 0 0  Down, Depressed, Hopeless 0 0 0  PHQ - 2 Score 0 0 0    History Frank Rosales has a past medical history of Anxiety, Arthritis, Asthma, Atypical nevus (03/27/2011), Atypical nevus (01/20/2007), Colon polyps, COPD (chronic obstructive pulmonary disease) ([place]), Diabetes mellitus without complication ([place]), High blood pressure, High cholesterol, Hilar adenopathy, Hypothyroidism, Multiple rib fractures (08/2016), Nodular basal cell carcinoma (BCC) (02/18/2020), Nodular basal cell carcinoma (BCC) (07/04/2021), Nodular basal cell carcinoma (BCC) (07/04/2021), SCCA (squamous cell carcinoma) of skin (02/18/2020), SCCA (squamous cell carcinoma) of skin (02/18/2020), SCCA (squamous cell carcinoma) of skin (02/18/2020), Squamous cell carcinoma in situ (SCCIS) (01/09/2016), and Superficial nodular basal cell carcinoma (BCC) (01/09/2016).   He has a past surgical history that includes Knee arthroscopy (Right, 05/14/2010); Wisdom tooth extraction; epidural injections; Fixation kyphoplasty lumbar spine; Back surgery; Video assisted thoracoscopy (Left, 08/22/2016); Pleural effusion drainage (Left, 08/22/2016); and Rib plating (Left, 08/22/2016).   His family history includes Diabetes in his mother; Emphysema in his father; Heart disease in his brother; Heart failure in his mother; Lung cancer in his father.He reports that he quit smoking about 4 years ago. His smoking  use included cigarettes. He has a 67.50 pack-year smoking history. He has never used smokeless tobacco. He reports current alcohol use of about 18.0 standard drinks of alcohol per week. He reports that he does not use drugs.    ROS Review of Systems  Constitutional:  Negative for fever.  Respiratory:  Positive for shortness of breath.   Cardiovascular:  Negative for chest pain.  Musculoskeletal:  Negative for arthralgias.  Skin:  Negative for rash.    Objective:  BP 139/75   Pulse (!) 104   Temp 98.4 F (36.9 C)   Ht 5\' 9"  (1.753 m)   Wt 209 lb (94.8 kg)   SpO2 94%   BMI 30.86 kg/m   BP Readings from Last 3 Encounters:  08/06/22 139/75  07/26/22 136/80  06/13/22 138/72    Wt Readings from Last 3 Encounters:  08/06/22 209 lb (94.8 kg)  07/26/22 210 lb 6.4 oz (95.4 kg)  06/13/22 210 lb 12.8 oz (95.6 kg)     Physical Exam Vitals reviewed.  Constitutional:      Appearance: He is well-developed.  HENT:     Head: Normocephalic and atraumatic.     Right Ear: External ear normal.     Left Ear: External ear normal.     Mouth/Throat:     Pharynx: No oropharyngeal exudate or posterior oropharyngeal erythema.  Eyes:     Pupils: Pupils are equal, round, and reactive to light.  Cardiovascular:     Rate and Rhythm: Normal rate and regular rhythm.     Heart sounds: No murmur heard. Pulmonary:     Effort: No respiratory distress.     Breath sounds: Wheezing present. No rales.  Abdominal:  Palpations: Abdomen is soft. There is no mass.  Musculoskeletal:     Cervical back: Normal range of motion and neck supple.  Neurological:     Mental Status: He is alert and oriented to person, place, and time.     Gait: Gait abnormal (antalgic).   EKG - NSR with borderline sinus tach. RBBB. LAE noted as well.     Assessment & Plan:   Frank Rosales was seen today for surgical clearance.  Diagnoses and all orders for this visit:  Pre-op exam -     EKG 12-Lead       I am  having Frank Rosales maintain his cholecalciferol, vitamin 0000000, folic acid, Famotidine (PEPCID PO), vitamin C, albuterol, Dupixent, albuterol, Breztri Aerosphere, dapagliflozin propanediol, diphenhydramine-acetaminophen, guaiFENesin, OneTouch Delica Lancets 99991111, atorvastatin, levothyroxine, metFORMIN, NIFEdipine, olmesartan-hydrochlorothiazide, and tamsulosin.  Allergies as of 08/06/2022   No Known Allergies      Medication List        Accurate as of August 06, 2022  4:15 PM. If you have any questions, ask your nurse or doctor.          albuterol 108 (90 Base) MCG/ACT inhaler Commonly known as: ProAir HFA Inhale 2 puffs into the lungs every 6 (six) hours.   albuterol (2.5 MG/3ML) 0.083% nebulizer solution Commonly known as: PROVENTIL Take 3 mLs (2.5 mg total) by nebulization every 4 (four) hours as needed for wheezing or shortness of breath.   atorvastatin 40 MG tablet Commonly known as: LIPITOR TAKE 1 TABLET BY MOUTH DAILY FOR CHOLESTEROL   Breztri Aerosphere 160-9-4.8 MCG/ACT Aero Generic drug: Budeson-Glycopyrrol-Formoterol Inhale 2 puffs into the lungs 2 (two) times daily.   cholecalciferol 1000 units tablet Commonly known as: VITAMIN D Take 1,000 Units by mouth daily.   dapagliflozin propanediol 10 MG Tabs tablet Commonly known as: Farxiga Take 1 tablet (10 mg total) by mouth daily before breakfast.   diphenhydramine-acetaminophen 25-500 MG Tabs tablet Commonly known as: TYLENOL PM Take 1 tablet by mouth at bedtime as needed.   Dupixent 300 MG/2ML prefilled syringe Generic drug: dupilumab Inject 300 mg into the skin every 14 (fourteen) days. Starting at Frank 15 for maintenance.   folic acid 1 MG tablet Commonly known as: FOLVITE Take 1 tablet (1 mg total) by mouth daily.   guaiFENesin 600 MG 12 hr tablet Commonly known as: MUCINEX Take by mouth 2 (two) times daily as needed.   levothyroxine 50 MCG tablet Commonly known as: SYNTHROID Take 1 tablet (50  mcg total) by mouth daily before breakfast.   metFORMIN 750 MG 24 hr tablet Commonly known as: GLUCOPHAGE-XR Take 2 tablets (1,500 mg total) by mouth daily with breakfast.   NIFEdipine 60 MG 24 hr tablet Commonly known as: ADALAT CC Take 1 tablet (60 mg total) by mouth 2 (two) times daily.   olmesartan-hydrochlorothiazide 40-25 MG tablet Commonly known as: BENICAR HCT Take 1 tablet by mouth daily.   OneTouch Delica Lancets 99991111 Misc Check BS up to 4 x daily Dx E11.9   PEPCID PO Take 1 tablet by mouth daily.   tamsulosin 0.4 MG Caps capsule Commonly known as: FLOMAX Take 2 capsules (0.8 mg total) by mouth at bedtime. For urine flow and prostate   vitamin B-12 250 MCG tablet Commonly known as: CYANOCOBALAMIN Take 250 mcg by mouth daily.   vitamin C 100 MG tablet Take 100 mg by mouth daily.         Follow-up: No follow-ups on file.  Claretta Fraise, M.D.

## 2022-08-08 ENCOUNTER — Ambulatory Visit: Payer: Medicare Other | Attending: Internal Medicine | Admitting: Internal Medicine

## 2022-08-08 ENCOUNTER — Encounter: Payer: Self-pay | Admitting: Internal Medicine

## 2022-08-08 ENCOUNTER — Encounter: Payer: Self-pay | Admitting: *Deleted

## 2022-08-08 ENCOUNTER — Telehealth: Payer: Self-pay | Admitting: Internal Medicine

## 2022-08-08 VITALS — BP 130/68 | HR 104 | Ht 69.0 in | Wt 211.0 lb

## 2022-08-08 DIAGNOSIS — Z01810 Encounter for preprocedural cardiovascular examination: Secondary | ICD-10-CM | POA: Diagnosis not present

## 2022-08-08 DIAGNOSIS — R0609 Other forms of dyspnea: Secondary | ICD-10-CM | POA: Insufficient documentation

## 2022-08-08 DIAGNOSIS — I10 Essential (primary) hypertension: Secondary | ICD-10-CM | POA: Diagnosis not present

## 2022-08-08 NOTE — Progress Notes (Signed)
Cardiology Office Note  Date: 08/08/2022   ID: RAFIK GILLEY, DOB 08-10-1955, MRN [id number]  PCP:  Claretta Fraise, MD  Cardiologist:  Chalmers Guest, MD Electrophysiologist:  None   Reason for Office Visit:  DOE and pre-op evaluation at the request of Dr Livia Snellen  History of Present Illness: Frank Rosales is a 67 y.o. male known to have COPD, HTN, DM 2, HLD was referred to cardiology clinic for preop cardiac risk stratification of hip replacement and also DOE.  Patient was exposed to asbestos during his work years and as a result, has been having DOE that was worsening recently. He also noticed decrease in his stamina levels. Denies any angina, palpitations, dizziness, lightheadedness, syncope, leg swelling. He was a former smoker, quit about 4 years ago. Denied alcohol use and illicit drug abuse.  He had no prior history of MI/PCI/CABG. He underwent CT chest in 12/2021 that showed severe coronary artery calcifications.  Past Medical History:  Diagnosis Date   Anxiety    Arthritis    "neck; lower back; right hip; right knee" (10/18/2014)   Asthma    Atypical nevus 03/27/2011   Mid Back-Severe (Exc)   Atypical nevus 01/20/2007   [facility] Flank[facility]ed [facility])   Colon polyps    COPD (chronic obstructive pulmonary disease) (HCC)    Diabetes mellitus without complication (HCC)    High blood pressure    High cholesterol    Hilar adenopathy    Hypothyroidism    Multiple rib fractures 08/2016   left side   Nodular basal cell carcinoma (BCC) 02/18/2020   Left Temporal Scalp   Nodular basal cell carcinoma (BCC) 07/04/2021   Left Zygomatic Area   Nodular basal cell carcinoma (BCC) 07/04/2021   Left Scaphoid Fossa (curet and 5FU)   SCCA (squamous cell carcinoma) of skin 02/18/2020   Left Nasal Sidewall (well diff)   SCCA (squamous cell carcinoma) of skin 02/18/2020   Right Temple (well diff)   SCCA (squamous cell carcinoma) of skin 02/18/2020   Left Temple (well  diff)   Squamous cell carcinoma in situ (SCCIS) 01/09/2016   Right Low Lip Med (Cx3,5FU,LN2) and Top Scalp Sup (Cx3,5FU)   Superficial nodular basal cell carcinoma (BCC) 01/09/2016   Left Sideburn(Exc)    Past Surgical History:  Procedure Laterality Date   BACK SURGERY     epidural injections     FIXATION KYPHOPLASTY LUMBAR SPINE     "L1"   KNEE ARTHROSCOPY Right 05/14/2010   PLEURAL EFFUSION DRAINAGE Left 08/22/2016   Procedure: DRAINAGE OF HEMOTHORAX;  Surgeon: [name] Nakayama, MD;  Location: [place];  Service: Thoracic;  Laterality: Left;   RIB PLATING Left 08/22/2016   Procedure: RIB PLATING;  Surgeon: [name] Nakayama, MD;  Location: [place];  Service: Thoracic;  Laterality: Left;   VIDEO ASSISTED THORACOSCOPY Left 08/22/2016   Procedure: VIDEO ASSISTED THORACOSCOPY;  Surgeon: [name] Nakayama, MD;  Location: [facility] OR;  Service: Thoracic;  Laterality: Left;   WISDOM TOOTH EXTRACTION      Current Outpatient Medications  Medication Sig Dispense Refill   albuterol (PROAIR HFA) 108 (90 Base) MCG/ACT inhaler Inhale 2 puffs into the lungs every 6 (six) hours. 54 g 1   albuterol (PROVENTIL) (2.5 MG/3ML) 0.083% nebulizer solution Take 3 mLs (2.5 mg total) by nebulization every 4 (four) hours as needed for wheezing or shortness of breath. 240 mL 5   Ascorbic Acid (VITAMIN C) 100 MG tablet Take 100 mg by  mouth daily.     atorvastatin (LIPITOR) 40 MG tablet TAKE 1 TABLET BY MOUTH DAILY FOR CHOLESTEROL 100 tablet 0   Budeson-Glycopyrrol-Formoterol (BREZTRI AEROSPHERE) 160-9-4.8 MCG/ACT AERO Inhale 2 puffs into the lungs 2 (two) times daily. 32.1 g 11   cholecalciferol (VITAMIN D) 1000 UNITS tablet Take 1,000 Units by mouth daily.      dapagliflozin propanediol (FARXIGA) 10 MG TABS tablet Take 1 tablet (10 mg total) by mouth daily before breakfast. 90 tablet 5   diphenhydramine-acetaminophen (TYLENOL PM) 25-500 MG TABS tablet Take 1 tablet by mouth at bedtime as needed.     dupilumab  (DUPIXENT) 300 MG/2ML prefilled syringe Inject 300 mg into the skin every 14 (fourteen) days. Starting at day 15 for maintenance. 6 mL 2   Famotidine (PEPCID PO) Take 1 tablet by mouth daily.     folic acid (FOLVITE) 1 MG tablet Take 1 tablet (1 mg total) by mouth daily. 90 tablet 3   guaiFENesin (MUCINEX) 600 MG 12 hr tablet Take by mouth 2 (two) times daily as needed.     levothyroxine (SYNTHROID) 50 MCG tablet Take 1 tablet (50 mcg total) by mouth daily before breakfast. 100 tablet 3   metFORMIN (GLUCOPHAGE-XR) 750 MG 24 hr tablet Take 2 tablets (1,500 mg total) by mouth daily with breakfast. 200 tablet 0   NIFEdipine (ADALAT CC) 60 MG 24 hr tablet Take 1 tablet (60 mg total) by mouth 2 (two) times daily. 200 tablet 3   olmesartan-hydrochlorothiazide (BENICAR HCT) 40-25 MG tablet Take 1 tablet by mouth daily. 100 tablet 0   OneTouch Delica Lancets 99991111 MISC Check BS up to 4 x daily Dx E11.9 400 each 3   tamsulosin (FLOMAX) 0.4 MG CAPS capsule Take 2 capsules (0.8 mg total) by mouth at bedtime. For urine flow and prostate 180 capsule 3   vitamin B-12 (CYANOCOBALAMIN) 250 MCG tablet Take 250 mcg by mouth daily.     No current facility-administered medications for this visit.   Allergies:  Patient has no known allergies.   Social History: The patient  reports that he quit smoking about 4 years ago. His smoking use included cigarettes. He has a 67.50 pack-year smoking history. He has never used smokeless tobacco. He reports current alcohol use of about 18.0 standard drinks of alcohol per week. He reports that he does not use drugs.   Family History: The patient's family history includes Diabetes in his mother; Emphysema in his father; Heart disease in his brother; Heart failure in his mother; Lung cancer in his father.   ROS:  Please see the history of present illness. Otherwise, complete review of systems is positive for none.  All other systems are reviewed and negative.   Physical Exam: VS:   BP 130/68 (BP Location: Right Arm, Patient Position: Sitting, Cuff Size: Large)   Pulse (!) 104   Ht 5\' 9"  (1.753 m)   Wt 211 lb (95.7 kg)   BMI 31.16 kg/m , BMI Body mass index is 31.16 kg/m.  Wt Readings from Last 3 Encounters:  08/08/22 211 lb (95.7 kg)  08/06/22 209 lb (94.8 kg)  07/26/22 210 lb 6.4 oz (95.4 kg)    General: Patient appears comfortable at rest. HEENT: Conjunctiva and lids normal, oropharynx clear with moist mucosa. Neck: Supple, no elevated JVP or carotid bruits, no thyromegaly. Lungs: Clear to auscultation, nonlabored breathing at rest. Cardiac: Regular rate and rhythm, no S3 or significant systolic murmur, no pericardial rub. Abdomen: Soft, nontender, no hepatomegaly, bowel  sounds present, no guarding or rebound. Extremities: No pitting edema, distal pulses 2+. Skin: Warm and dry. Musculoskeletal: No kyphosis. Neuropsychiatric: Alert and oriented x3, affect grossly appropriate.  ECG:  An ECG dated 08/08/22 was personally reviewed today and demonstrated:  Sinus tachycardia, HR 104 bpm and RBBB  Recent Labwork: 06/13/2022: ALT 39; AST 36; BUN 11; Creatinine, Ser 1.10; Hemoglobin 17.9; Platelets 282; Potassium 4.5; Sodium 131; TSH 4.260     Component Value Date/Time   CHOL 233 (H) 06/13/2022 0912   TRIG 102 06/13/2022 0912   TRIG 99 12/03/2013 0858   HDL 78 06/13/2022 0912   HDL 75 12/03/2013 0858   CHOLHDL 3.0 06/13/2022 0912   LDLCALC 137 (H) 06/13/2022 0912   LDLCALC 125 (H) 12/03/2013 ID:[id number]    Other Studies Reviewed Today:   Assessment and Plan: Patient is a 67 year old M known to have HTN, DM 2, HLD, COPD was referred to cardiology clinic for evaluation of DOE and preop cardiac risk stratification for hip replacement.  # Preop cardiac risk stratification for hip replacement -Patient's METs less than 4 due to worsening DOE and decrease in stamina levels. EKG showed sinus tachycardia, HR 104 bpm and right bundle branch block. Obtain Lexiscan. If  Lexiscan is normal, he is at a moderate risk for any perioperative cardiac complications based on his comorbidities. Otherwise, based on the stress test findings, he might need LHC for further evaluation.  # DOE -Patient had DOE with recent worsening and decrease in stamina levels.  Obtain 2D echocardiogram. Prior echocardiogram from 2016 showed normal LVEF and no valve abnormalities.  # HTN: Continue nifedipine 60 mg twice daily and olmesartan-HCTZ 40-25 mg once daily, HTN management per PCP. # HLD: Continue atorvastatin 40 mg nightly.  Goal LDL less than 100.  I have spent a total of 45 minutes with patient reviewing chart, EKGs, labs and examining patient as well as establishing an assessment and plan that was discussed with the patient.  > 50% of time was spent in direct patient care.      Medication Adjustments/Labs and Tests Ordered: Current medicines are reviewed at length with the patient today.  Concerns regarding medicines are outlined above.   Tests Ordered: Orders Placed This Encounter  Procedures   NM Myocar Multi W/Spect W/Wall Motion / EF   EKG 12-Lead   ECHOCARDIOGRAM COMPLETE    Medication Changes: No orders of the defined types were placed in this encounter.   Disposition:  Follow up  3 months  Signed Yesmin Mutch Fidel Levy, MD, 08/08/2022 2:56 PM    [facility] [place], [place], [place] [address]

## 2022-08-08 NOTE — Telephone Encounter (Signed)
Checking percert on the following patient for testing scheduled at [facility].   LEXISCAN   08/13/2022

## 2022-08-08 NOTE — Patient Instructions (Addendum)
Medication Instructions:  Your physician recommends that you continue on your current medications as directed. Please refer to the Current Medication list given to you today.  Labwork: none  Testing/Procedures: Your physician has requested that you have a lexiscan myoview. For further information please visit [email or web address]. Please follow instruction sheet, as given. Your physician has requested that you have an echocardiogram. Echocardiography is a painless test that uses sound waves to create images of your heart. It provides your doctor with information about the size and shape of your heart and how well your heart's chambers and valves are working. This procedure takes approximately one hour. There are no restrictions for this procedure. Please do NOT wear cologne, perfume, aftershave, or lotions (deodorant is allowed). Please arrive 15 minutes prior to your appointment time.  Follow-Up: Your physician recommends that you schedule a follow-up appointment in: 3 months  Any Other Special Instructions Will Be Listed Below (If Applicable).  If you need a refill on your cardiac medications before your next appointment, please call your pharmacy.

## 2022-08-13 ENCOUNTER — Encounter (HOSPITAL_COMMUNITY)
Admission: RE | Admit: 2022-08-13 | Discharge: 2022-08-13 | Disposition: A | Discharge status: Home / Self Care | Payer: Medicare Other | Source: Ambulatory Visit | Attending: Internal Medicine | Admitting: Internal Medicine

## 2022-08-13 ENCOUNTER — Ambulatory Visit (HOSPITAL_COMMUNITY)
Admission: RE | Admit: 2022-08-13 | Discharge: 2022-08-13 | Disposition: A | Discharge status: Home / Self Care | Payer: Medicare Other | Source: Ambulatory Visit | Attending: Internal Medicine | Admitting: Internal Medicine

## 2022-08-13 DIAGNOSIS — R0609 Other forms of dyspnea: Secondary | ICD-10-CM | POA: Diagnosis not present

## 2022-08-13 LAB — NM MYOCAR MULTI W/SPECT W/WALL MOTION / EF
Base ST Depression (mm): 0 mm
LV dias vol: 110 mL (ref 62–150)
LV sys vol: 40 mL
Nuc Stress EF: 64 %
Peak HR: 109 {beats}/min
RATE: 0.5
Rest HR: 92 {beats}/min
Rest Nuclear Isotope Dose: 10.5 mCi
SDS: 6
SRS: 0
SSS: 6
ST Depression (mm): 0 mm
Stress Nuclear Isotope Dose: 30 mCi
TID: 1.1

## 2022-08-13 MED ORDER — REGADENOSON 0.4 MG/5ML IV SOLN
INTRAVENOUS | Status: AC
  Administered 2022-08-13: 0.4 mg via INTRAVENOUS
  Filled 2022-08-13: qty 5

## 2022-08-13 MED ORDER — SODIUM CHLORIDE FLUSH 0.9 % IV SOLN
INTRAVENOUS | Status: AC
  Administered 2022-08-13: 10 mL via INTRAVENOUS
  Filled 2022-08-13: qty 10

## 2022-08-13 MED ORDER — TECHNETIUM TC 99M TETROFOSMIN IV KIT
30.0000 | PACK | Freq: Once | INTRAVENOUS | Status: AC | PRN
  Administered 2022-08-13: 30 via INTRAVENOUS

## 2022-08-13 MED ORDER — TECHNETIUM TC 99M TETROFOSMIN IV KIT
10.0000 | PACK | Freq: Once | INTRAVENOUS | Status: AC | PRN
  Administered 2022-08-13: 10.5 via INTRAVENOUS

## 2022-08-15 ENCOUNTER — Encounter (HOSPITAL_COMMUNITY): Admission: RE | Admit: 2022-08-15 | Payer: Medicare Other | Source: Ambulatory Visit

## 2022-08-16 ENCOUNTER — Encounter: Payer: Self-pay | Admitting: Nurse Practitioner

## 2022-08-16 ENCOUNTER — Telehealth: Payer: Self-pay | Admitting: Nurse Practitioner

## 2022-08-16 ENCOUNTER — Ambulatory Visit: Payer: Medicare Other | Attending: Nurse Practitioner | Admitting: Nurse Practitioner

## 2022-08-16 VITALS — BP 135/76 | HR 110 | Ht 68.5 in | Wt 210.2 lb

## 2022-08-16 DIAGNOSIS — R0609 Other forms of dyspnea: Secondary | ICD-10-CM | POA: Diagnosis not present

## 2022-08-16 DIAGNOSIS — Z01812 Encounter for preprocedural laboratory examination: Secondary | ICD-10-CM | POA: Diagnosis not present

## 2022-08-16 DIAGNOSIS — I10 Essential (primary) hypertension: Secondary | ICD-10-CM | POA: Diagnosis not present

## 2022-08-16 DIAGNOSIS — E785 Hyperlipidemia, unspecified: Secondary | ICD-10-CM | POA: Diagnosis not present

## 2022-08-16 DIAGNOSIS — J449 Chronic obstructive pulmonary disease, unspecified: Secondary | ICD-10-CM | POA: Diagnosis not present

## 2022-08-16 DIAGNOSIS — R9439 Abnormal result of other cardiovascular function study: Secondary | ICD-10-CM | POA: Diagnosis not present

## 2022-08-16 DIAGNOSIS — F1010 Alcohol abuse, uncomplicated: Secondary | ICD-10-CM

## 2022-08-16 DIAGNOSIS — R000 Tachycardia, unspecified: Secondary | ICD-10-CM | POA: Diagnosis not present

## 2022-08-16 MED ORDER — SODIUM CHLORIDE 0.9% FLUSH: 3.0000 mL | Freq: Two times a day (BID) | INTRAVENOUS | Status: AC

## 2022-08-16 MED ORDER — BISOPROLOL FUMARATE 5 MG PO TABS: 2.5000 mg | ORAL_TABLET | Freq: Every day | ORAL | 1 refills | Status: DC

## 2022-08-16 NOTE — Addendum Note (Signed)
Addended by: Finis Bud on: 08/16/2022 09:53 PM   Modules accepted: Orders

## 2022-08-16 NOTE — Telephone Encounter (Signed)
PERCERT:  LHC: Wednesday, 4/10 w/Dr. Irish Lack

## 2022-08-16 NOTE — Progress Notes (Signed)
Office Visit    Patient Name: Frank Rosales Date of Encounter: 08/16/2022  PCP:  Claretta Fraise, [place]  Cardiologist:  Chalmers Guest, MD  Advanced Practice Provider:  Finis Bud, NP Electrophysiologist:  None   Chief Complaint    Frank Rosales is a 67 y.o. male with a hx of DOE, COPD, type 2 diabetes, hypertension, former smoker, ETOH abuse, and hyperlipidemia, who presents today for scheduled follow-up based on abnormal stress test results.   Past Medical History    Past Medical History:  Diagnosis Date   Anxiety    Arthritis    "neck; lower back; right hip; right knee" (10/18/2014)   Asthma    Atypical nevus 03/27/2011   Mid Back-Severe (Exc)   Atypical nevus 01/20/2007   Right Flank-Marked ([facility])   Colon polyps    COPD (chronic obstructive pulmonary disease)    Diabetes mellitus without complication    High blood pressure    High cholesterol    Hilar adenopathy    Hypothyroidism    Multiple rib fractures 08/2016   left side   Nodular basal cell carcinoma (BCC) 02/18/2020   Left Temporal Scalp   Nodular basal cell carcinoma (BCC) 07/04/2021   Left Zygomatic Area   Nodular basal cell carcinoma (BCC) 07/04/2021   Left Scaphoid Fossa (curet and 5FU)   SCCA (squamous cell carcinoma) of skin 02/18/2020   Left Nasal Sidewall (well diff)   SCCA (squamous cell carcinoma) of skin 02/18/2020   Right Temple (well diff)   SCCA (squamous cell carcinoma) of skin 02/18/2020   Left Temple (well diff)   Squamous cell carcinoma in situ (SCCIS) 01/09/2016   Right Low Lip Med (Cx3,5FU,LN2) and Top Scalp Sup (Cx3,5FU)   Superficial nodular basal cell carcinoma (BCC) 01/09/2016   Left Sideburn(Exc)   Past Surgical History:  Procedure Laterality Date   BACK SURGERY     epidural injections     FIXATION KYPHOPLASTY LUMBAR SPINE     "L1"   KNEE ARTHROSCOPY Right 05/14/2010   PLEURAL EFFUSION DRAINAGE Left 08/22/2016    Procedure: DRAINAGE OF HEMOTHORAX;  Surgeon: [name] Nakayama, MD;  Location: [place];  Service: Thoracic;  Laterality: Left;   RIB PLATING Left 08/22/2016   Procedure: RIB PLATING;  Surgeon: [name] Nakayama, MD;  Location: [place];  Service: Thoracic;  Laterality: Left;   VIDEO ASSISTED THORACOSCOPY Left 08/22/2016   Procedure: VIDEO ASSISTED THORACOSCOPY;  Surgeon: [name] Nakayama, MD;  Location: [facility] OR;  Service: Thoracic;  Laterality: Left;   WISDOM TOOTH EXTRACTION      Allergies  No Known Allergies  History of Present Illness    Frank Rosales is a 67 y.o. male with a PMH as mentioned above.  Last seen by Dr. Dellia Cloud on August 08, 2022 for preoperative cardiovascular risk assessment for scheduled hip replacement and evaluation for DOE.  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  Denied any chest pain.  Former smoker, quit about 4 years ago.  Denied any illicit drug/alcohol use.  No reported history of heart attack, percutaneous coronary intervention, or CABG.  CT scan of chest in August 2023 showed severe coronary artery calcifications.  Echo scheduled.  Lexiscan was abnormal.  Medium size reversible defect with mild reduction in uptake present in apical to basal septal location consistent with ischemia.  Also small size reversible defect with mild  reduction in uptake present in apex consistent with ischemia.  Medium size defect with mild reduction in uptake present in apical to basal inferior location, consistent with ischemia.  Nuclear stress EF 64%.  Findings were consistent with ischemia, study was considered high risk.  Dr. Vonzella Nipple recommended to be set up with LHC within 1 week.  Today he presents for follow-up.  He states he is doing the same since last visit. Continues to note dyspnea on exertion, stable. Denies any chest pain, palpitations, syncope, presyncope, dizziness, orthopnea, PND, swelling or  significant weight changes, acute bleeding, or claudication.  SH: Former smoker, drinks 5-6 beers per day  EKGs/Labs/Other Studies Reviewed:   The following studies were reviewed today:  EKG:  EKG is not ordered today.     Lexiscan 08/13/2022:   Stress ECG is negative for ischemia. Patient experienced chest pain during the stress test which resolved within a few minutes into the test.   LV perfusion is abnormal.There is a medium sized reversible defect with mild reduction in uptake present in the apical to basal septal location consistent with ischemia. There is a small sized reversible defect with mild reduction in uptake present in the apex consistent with ischemia. There is a medium sized defect with mild reduction in uptake present in the apical to basal inferior location consistent with ischemia.   Left ventricular function is normal. Nuclear stress EF: 64 %.   Findings are consistent with ischemia. The study is high risk.  Echo 2016: Study Conclusions   - Left ventricle: The cavity size was normal. Wall thickness was    normal. The estimated ejection fraction was 60%. Wall motion was    normal; there were no regional wall motion abnormalities.  - Right ventricle: The cavity size was normal. Systolic function    was normal.   Recent Labs: 06/13/2022: ALT 39; BUN 11; Creatinine, Ser 1.10; Hemoglobin 17.9; Platelets 282; Potassium 4.5; Sodium 131; TSH 4.260  Recent Lipid Panel    Component Value Date/Time   CHOL 233 (H) 06/13/2022 0912   TRIG 102 06/13/2022 0912   TRIG 99 12/03/2013 0858   HDL 78 06/13/2022 0912   HDL 75 12/03/2013 0858   CHOLHDL 3.0 06/13/2022 0912   LDLCALC 137 (H) 06/13/2022 0912   LDLCALC 125 (H) 12/03/2013 0858    Risk Assessment/Calculations:    The 10-year ASCVD risk score (Arnett DK, et al., 2019) is: 32%   Values used to calculate the score:     Age: 25 years     Sex: Male     Is Non-Hispanic African American: No     Diabetic: Yes      Tobacco smoker: Yes     Systolic Blood Pressure: A999333 mmHg     Is BP treated: Yes     HDL Cholesterol: 78 mg/dL     Total Cholesterol: 233 mg/dL   Home Medications   Current Meds  Medication Sig   albuterol (PROAIR HFA) 108 (90 Base) MCG/ACT inhaler Inhale 2 puffs into the lungs every 6 (six) hours.   albuterol (PROVENTIL) (2.5 MG/3ML) 0.083% nebulizer solution Take 3 mLs (2.5 mg total) by nebulization every 4 (four) hours as needed for wheezing or shortness of breath.   Ascorbic Acid (VITAMIN C) 500 MG CAPS Take 500 mg by mouth daily.   aspirin EC 81 MG tablet Take 81 mg by mouth daily. Swallow whole.   bisoprolol (ZEBETA) 5 MG tablet Take 0.5 tablets (2.5 mg total) by mouth daily.  Budeson-Glycopyrrol-Formoterol (BREZTRI AEROSPHERE) 160-9-4.8 MCG/ACT AERO Inhale 2 puffs into the lungs 2 (two) times daily.   Cholecalciferol (VITAMIN D) 125 MCG (5000 UT) CAPS Take 5,000 Units by mouth daily.   cyanocobalamin (VITAMIN B12) 1000 MCG tablet Take 1,000 mcg by mouth daily.   dapagliflozin propanediol (FARXIGA) 10 MG TABS tablet Take 1 tablet (10 mg total) by mouth daily before breakfast.   diphenhydramine-acetaminophen (TYLENOL PM) 25-500 MG TABS tablet Take 1 tablet by mouth at bedtime.   dupilumab (DUPIXENT) 300 MG/2ML prefilled syringe Inject 300 mg into the skin every 14 (fourteen) days. Starting at day 15 for maintenance.   famotidine (PEPCID) 20 MG tablet Take 20 tablets by mouth at bedtime.   folic acid (FOLVITE) 1 MG tablet Take 1 tablet (1 mg total) by mouth daily. (Patient taking differently: Take 1 mg by mouth daily. 400 mcg)   Krill Oil 500 MG CAPS Take 500 mg by mouth daily.   levothyroxine (SYNTHROID) 50 MCG tablet Take 1 tablet (50 mcg total) by mouth daily before breakfast.   metFORMIN (GLUCOPHAGE-XR) 750 MG 24 hr tablet Take 2 tablets (1,500 mg total) by mouth daily with breakfast.   NIFEdipine (ADALAT CC) 60 MG 24 hr tablet Take 1 tablet (60 mg total) by mouth 2 (two) times  daily. (Patient taking differently: Take 120 mg by mouth daily.)   olmesartan-hydrochlorothiazide (BENICAR HCT) 40-25 MG tablet Take 1 tablet by mouth daily.   OneTouch Delica Lancets 99991111 MISC Check BS up to 4 x daily Dx E11.9     Review of Systems    All other systems reviewed and are otherwise negative except as noted above.  Physical Exam    VS:  BP 135/76 (BP Location: Left Arm, Patient Position: Sitting, Cuff Size: Normal)   Pulse (!) 110   Ht 5' 8.5" (1.74 m)   Wt 210 lb 3.2 oz (95.3 kg)   SpO2 94%   BMI 31.50 kg/m  , BMI Body mass index is 31.5 kg/m.  Wt Readings from Last 3 Encounters:  08/16/22 210 lb 3.2 oz (95.3 kg)  08/08/22 211 lb (95.7 kg)  08/06/22 209 lb (94.8 kg)     GEN: Obese, 67 y.o. male in no acute distress, appears nervous HEENT: normal. Neck: Supple, no JVD, carotid bruits, or masses. Cardiac: S1/S2, regular rhythm and fast rate,, no murmurs, rubs, or gallops. No clubbing, cyanosis, edema.  Radials/PT 2+ and equal bilaterally.  Respiratory:  Respirations regular and unlabored, clear to auscultation bilaterally. MS: No deformity or atrophy. Skin: Warm and dry, no rash. Neuro:  Strength and sensation are intact, occasional tremors Psych: Calm and pleasant, appears nervous  Assessment & Plan    Abnormal stress test, DOE, COPD Recent abnormal, high risk Lexiscan. Admits to DOE. Discussed case with Dr. Dellia Cloud, agreeable with LHC recommendation. Discussed LHC including risks and benefits as outlined below, and patient is agreeable to proceed. Will arrange. Discussed arranging preprocedure lab abs, including CBC and BMET.  Also discussed which medications to hold and when to resume.  Patient verbalized understanding.  No medication changes at this time.  ED precautions discussed.  Heart healthy diet encouraged. Denies any recent COPD exacerbation. Continue to follow with PCP.  Shared Decision Making/Informed Consent The risks [stroke (1 in 1000), death  (1 in 1000), kidney failure [usually temporary] (1 in 500), bleeding (1 in 200), allergic reaction [possibly serious] (1 in 200)], benefits (diagnostic support and management of coronary artery disease) and alternatives of a cardiac catheterization were discussed  in detail with Frank Rosales and he is willing to proceed.   2. HTN, tachycardia BP elevated on arrival repeat BP 135/76. Discussed SBP goal < 130. However admits to [facility]. Discussed to monitor BP at home at least 2 hours after medications and sitting for 5-10 minutes. D/w Dr. Dellia Cloud and will start low dose Bisoprolol at 2.5 mg daily to improve BP and HR. Continue medication regimen. Heart healthy diet encouraged.   3. HLD Elevated LDL in 05/2022. Started on lipitor by PCP. Labs managed by PCP. Continue atorvastatin. Heart healthy encouraged.   5. ETOH abuse Discussed cutting back on alcohol. Continue to follow with PCP. Heart healthy diet encouraged.   Disposition: Follow up in 3-4 week(s) with Frank P Mallipeddi, MD or APP post cardiac cath or sooner if anything changes.  Signed, Finis Bud, NP 08/16/2022, 9:42 PM [place]

## 2022-08-16 NOTE — H&P (View-Only) (Signed)
 Office Visit    Patient Name: Frank Rosales Date of Encounter: 08/16/2022  PCP:  Stacks, Warren, MD   [facility]  Cardiologist:  Vishnu P Mallipeddi, MD  Advanced Practice Provider:  Darrick Greenlaw, NP Electrophysiologist:  None   Chief Complaint    Frank Rosales is a 66 y.o. male with a hx of DOE, COPD, type 2 diabetes, hypertension, former smoker, ETOH abuse, and hyperlipidemia, who presents today for scheduled follow-up based on abnormal stress test results.   Past Medical History    Past Medical History:  Diagnosis Date   Anxiety    Arthritis    "neck; lower back; right hip; right knee" (10/18/2014)   Asthma    Atypical nevus 03/27/2011   Mid Back-Severe (Exc)   Atypical nevus 01/20/2007   Right Flank-Marked ([facility])   Colon polyps    COPD (chronic obstructive pulmonary disease)    Diabetes mellitus without complication    High blood pressure    High cholesterol    Hilar adenopathy    Hypothyroidism    Multiple rib fractures 08/2016   left side   Nodular basal cell carcinoma (BCC) 02/18/2020   Left Temporal Scalp   Nodular basal cell carcinoma (BCC) 07/04/2021   Left Zygomatic Area   Nodular basal cell carcinoma (BCC) 07/04/2021   Left Scaphoid Fossa (curet and 5FU)   SCCA (squamous cell carcinoma) of skin 02/18/2020   Left Nasal Sidewall (well diff)   SCCA (squamous cell carcinoma) of skin 02/18/2020   Right Temple (well diff)   SCCA (squamous cell carcinoma) of skin 02/18/2020   Left Temple (well diff)   Squamous cell carcinoma in situ (SCCIS) 01/09/2016   Right Low Lip Med (Cx3,5FU,LN2) and Top Scalp Sup (Cx3,5FU)   Superficial nodular basal cell carcinoma (BCC) 01/09/2016   Left Sideburn(Exc)   Past Surgical History:  Procedure Laterality Date   BACK SURGERY     epidural injections     FIXATION KYPHOPLASTY LUMBAR SPINE     "L1"   KNEE ARTHROSCOPY Right 05/14/2010   PLEURAL EFFUSION DRAINAGE Left 08/22/2016    Procedure: DRAINAGE OF HEMOTHORAX;  Surgeon: Steven C Hendrickson, MD;  Location: [facility] OR;  Service: Thoracic;  Laterality: Left;   RIB PLATING Left 08/22/2016   Procedure: RIB PLATING;  Surgeon: Steven C Hendrickson, MD;  Location: [facility] OR;  Service: Thoracic;  Laterality: Left;   VIDEO ASSISTED THORACOSCOPY Left 08/22/2016   Procedure: VIDEO ASSISTED THORACOSCOPY;  Surgeon: Steven C Hendrickson, MD;  Location: [facility] OR;  Service: Thoracic;  Laterality: Left;   WISDOM TOOTH EXTRACTION      Allergies  No Known Allergies  History of Present Illness    Frank Rosales is a 66 y.o. male with a PMH as mentioned above.  Last seen by Dr. Mallipeddi on August 08, 2022 for preoperative cardiovascular risk assessment for scheduled hip replacement and evaluation for DOE.  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  Denied any chest pain.  Former smoker, quit about 4 years ago.  Denied any illicit drug/alcohol use.  No reported history of heart attack, percutaneous coronary intervention, or CABG.  CT scan of chest in August 2023 showed severe coronary artery calcifications.  Echo scheduled.  Lexiscan was abnormal.  Medium size reversible defect with mild reduction in uptake present in apical to basal septal location consistent with ischemia.  Also small size reversible defect with mild   reduction in uptake present in apex consistent with ischemia.  Medium size defect with mild reduction in uptake present in apical to basal inferior location, consistent with ischemia.  Nuclear stress EF 64%.  Findings were consistent with ischemia, study was considered high risk.  Dr. Mallampati recommended to be set up with LHC within 1 week.  Today he presents for follow-up.  He states he is doing the same since last visit. Continues to note dyspnea on exertion, stable. Denies any chest pain, palpitations, syncope, presyncope, dizziness, orthopnea, PND, swelling or  significant weight changes, acute bleeding, or claudication.  SH: Former smoker, drinks 5-6 beers per day  EKGs/Labs/Other Studies Reviewed:   The following studies were reviewed today:  EKG:  EKG is not ordered today.     Lexiscan 08/13/2022:   Stress ECG is negative for ischemia. Patient experienced chest pain during the stress test which resolved within a few minutes into the test.   LV perfusion is abnormal.There is a medium sized reversible defect with mild reduction in uptake present in the apical to basal septal location consistent with ischemia. There is a small sized reversible defect with mild reduction in uptake present in the apex consistent with ischemia. There is a medium sized defect with mild reduction in uptake present in the apical to basal inferior location consistent with ischemia.   Left ventricular function is normal. Nuclear stress EF: 64 %.   Findings are consistent with ischemia. The study is high risk.  Echo 2016: Study Conclusions   - Left ventricle: The cavity size was normal. Wall thickness was    normal. The estimated ejection fraction was 60%. Wall motion was    normal; there were no regional wall motion abnormalities.  - Right ventricle: The cavity size was normal. Systolic function    was normal.   Recent Labs: 06/13/2022: ALT 39; BUN 11; Creatinine, Ser 1.10; Hemoglobin 17.9; Platelets 282; Potassium 4.5; Sodium 131; TSH 4.260  Recent Lipid Panel    Component Value Date/Time   CHOL 233 (H) 06/13/2022 0912   TRIG 102 06/13/2022 0912   TRIG 99 12/03/2013 0858   HDL 78 06/13/2022 0912   HDL 75 12/03/2013 0858   CHOLHDL 3.0 06/13/2022 0912   LDLCALC 137 (H) 06/13/2022 0912   LDLCALC 125 (H) 12/03/2013 0858    Risk Assessment/Calculations:    The 10-year ASCVD risk score (Arnett DK, et al., 2019) is: 32%   Values used to calculate the score:     Age: 66 years     Sex: Male     Is Non-Hispanic African American: No     Diabetic: Yes      Tobacco smoker: Yes     Systolic Blood Pressure: 135 mmHg     Is BP treated: Yes     HDL Cholesterol: 78 mg/dL     Total Cholesterol: 233 mg/dL   Home Medications   Current Meds  Medication Sig   albuterol (PROAIR HFA) 108 (90 Base) MCG/ACT inhaler Inhale 2 puffs into the lungs every 6 (six) hours.   albuterol (PROVENTIL) (2.5 MG/3ML) 0.083% nebulizer solution Take 3 mLs (2.5 mg total) by nebulization every 4 (four) hours as needed for wheezing or shortness of breath.   Ascorbic Acid (VITAMIN C) 500 MG CAPS Take 500 mg by mouth daily.   aspirin EC 81 MG tablet Take 81 mg by mouth daily. Swallow whole.   bisoprolol (ZEBETA) 5 MG tablet Take 0.5 tablets (2.5 mg total) by mouth daily.     Budeson-Glycopyrrol-Formoterol (BREZTRI AEROSPHERE) 160-9-4.8 MCG/ACT AERO Inhale 2 puffs into the lungs 2 (two) times daily.   Cholecalciferol (VITAMIN D) 125 MCG (5000 UT) CAPS Take 5,000 Units by mouth daily.   cyanocobalamin (VITAMIN B12) 1000 MCG tablet Take 1,000 mcg by mouth daily.   dapagliflozin propanediol (FARXIGA) 10 MG TABS tablet Take 1 tablet (10 mg total) by mouth daily before breakfast.   diphenhydramine-acetaminophen (TYLENOL PM) 25-500 MG TABS tablet Take 1 tablet by mouth at bedtime.   dupilumab (DUPIXENT) 300 MG/2ML prefilled syringe Inject 300 mg into the skin every 14 (fourteen) days. Starting at day 15 for maintenance.   famotidine (PEPCID) 20 MG tablet Take 20 tablets by mouth at bedtime.   folic acid (FOLVITE) 1 MG tablet Take 1 tablet (1 mg total) by mouth daily. (Patient taking differently: Take 1 mg by mouth daily. 400 mcg)   Krill Oil 500 MG CAPS Take 500 mg by mouth daily.   levothyroxine (SYNTHROID) 50 MCG tablet Take 1 tablet (50 mcg total) by mouth daily before breakfast.   metFORMIN (GLUCOPHAGE-XR) 750 MG 24 hr tablet Take 2 tablets (1,500 mg total) by mouth daily with breakfast.   NIFEdipine (ADALAT CC) 60 MG 24 hr tablet Take 1 tablet (60 mg total) by mouth 2 (two) times  daily. (Patient taking differently: Take 120 mg by mouth daily.)   olmesartan-hydrochlorothiazide (BENICAR HCT) 40-25 MG tablet Take 1 tablet by mouth daily.   OneTouch Delica Lancets 33G MISC Check BS up to 4 x daily Dx E11.9     Review of Systems    All other systems reviewed and are otherwise negative except as noted above.  Physical Exam    VS:  BP 135/76 (BP Location: Left Arm, Patient Position: Sitting, Cuff Size: Normal)   Pulse (!) 110   Ht 5' 8.5" (1.74 m)   Wt 210 lb 3.2 oz (95.3 kg)   SpO2 94%   BMI 31.50 kg/m  , BMI Body mass index is 31.5 kg/m.  Wt Readings from Last 3 Encounters:  08/16/22 210 lb 3.2 oz (95.3 kg)  08/08/22 211 lb (95.7 kg)  08/06/22 209 lb (94.8 kg)     GEN: Obese, 66 y.o. male in no acute distress, appears nervous HEENT: normal. Neck: Supple, no JVD, carotid bruits, or masses. Cardiac: S1/S2, regular rhythm and fast rate,, no murmurs, rubs, or gallops. No clubbing, cyanosis, edema.  Radials/PT 2+ and equal bilaterally.  Respiratory:  Respirations regular and unlabored, clear to auscultation bilaterally. MS: No deformity or atrophy. Skin: Warm and dry, no rash. Neuro:  Strength and sensation are intact, occasional tremors Psych: Calm and pleasant, appears nervous  Assessment & Plan    Abnormal stress test, DOE, COPD Recent abnormal, high risk Lexiscan. Admits to DOE. Discussed case with Dr. Mallipeddi, agreeable with LHC recommendation. Discussed LHC including risks and benefits as outlined below, and patient is agreeable to proceed. Will arrange. Discussed arranging preprocedure lab abs, including CBC and BMET.  Also discussed which medications to hold and when to resume.  Patient verbalized understanding.  No medication changes at this time.  ED precautions discussed.  Heart healthy diet encouraged. Denies any recent COPD exacerbation. Continue to follow with PCP.  Shared Decision Making/Informed Consent The risks [stroke (1 in 1000), death  (1 in 1000), kidney failure [usually temporary] (1 in 500), bleeding (1 in 200), allergic reaction [possibly serious] (1 in 200)], benefits (diagnostic support and management of coronary artery disease) and alternatives of a cardiac catheterization were discussed   in detail with Mr. Wardle and he is willing to proceed.   2. HTN, tachycardia BP elevated on arrival repeat BP 135/76. Discussed SBP goal < 130. However admits to [facility]. Discussed to monitor BP at home at least 2 hours after medications and sitting for 5-10 minutes. D/w Dr. Mallipeddi and will start low dose Bisoprolol at 2.5 mg daily to improve BP and HR. Continue medication regimen. Heart healthy diet encouraged.   3. HLD Elevated LDL in 05/2022. Started on lipitor by PCP. Labs managed by PCP. Continue atorvastatin. Heart healthy encouraged.   5. ETOH abuse Discussed cutting back on alcohol. Continue to follow with PCP. Heart healthy diet encouraged.   Disposition: Follow up in 3-4 week(s) with Vishnu P Mallipeddi, MD or APP post cardiac cath or sooner if anything changes.  Signed, Vitalia Stough, NP 08/16/2022, 9:42 PM [facility] 

## 2022-08-16 NOTE — Patient Instructions (Signed)
Medication Instructions:  Your physician has recommended you make the following change in your medication:  START bisoprolol 2.5 mg once a day Continue all other medications as directed  Labwork: CBC, BMET (due @ [facility] before heart cath)  Testing/Procedures:  [place] [id number] [place] [address] Dept: [phone] Loc: [phone]  Frank Rosales  08/16/2022  You are scheduled for a Cardiac Catheterization on Wednesday, April 10 with Dr. Larae Grooms.  1. Please arrive at the [facility] (Main Entrance A) at [facility]: [address] [place], [place] [address] at 5:30 AM (This time is two hours before your procedure to ensure your preparation). Free valet parking service is available.   Special note: Every effort is made to have your procedure done on time. Please understand that emergencies sometimes delay scheduled procedures.  2. Diet: Do not eat solid foods after midnight.  The patient may have clear liquids until 5am upon the day of the procedure.  3. Labs: You will need to have blood drawn on Monday, April 8 at [place] do not need to be fasting.  4. Medication instructions in preparation for your procedure:  Hold metformin two days before procedure and resume two days after procedure.  Hold farxiga two days prior to procedure and resume the day after procedure.  Hold benicar the morning of procedure.  On the morning of your procedure, take your Aspirin 81 mg and any morning medicines NOT listed above.  You may use sips of water.  5. Plan for one night stay--bring personal belongings. 6. Bring a current list of your medications and current insurance cards. 7. You MUST have a responsible person to drive you home. 8. Someone MUST be with you the first 24 hours after you arrive home or your discharge will be  delayed. 9. Please wear clothes that are easy to get on and off and wear slip-on shoes.  Thank you for allowing [place] to care for you!   -- [facility]   Follow-Up:  Your physician recommends that you schedule a follow-up appointment in: 3-4 weeks  Any Other Special Instructions Will Be Listed Below (If Applicable).  If you need a refill on your cardiac medications before your next appointment, please call your pharmacy.

## 2022-08-20 ENCOUNTER — Other Ambulatory Visit (HOSPITAL_COMMUNITY)
Admission: RE | Admit: 2022-08-20 | Discharge: 2022-08-20 | Disposition: A | Discharge status: Home / Self Care | Payer: Medicare Other | Source: Ambulatory Visit | Attending: Nurse Practitioner | Admitting: Nurse Practitioner

## 2022-08-20 DIAGNOSIS — Z01812 Encounter for preprocedural laboratory examination: Secondary | ICD-10-CM | POA: Insufficient documentation

## 2022-08-20 DIAGNOSIS — I10 Essential (primary) hypertension: Secondary | ICD-10-CM | POA: Insufficient documentation

## 2022-08-20 LAB — BASIC METABOLIC PANEL
Anion gap: 12 (ref 5–15)
BUN: 12 mg/dL (ref 8–23)
CO2: 25 mmol/L (ref 22–32)
Calcium: 9.3 mg/dL (ref 8.9–10.3)
Chloride: 96 mmol/L — ABNORMAL LOW (ref 98–111)
Creatinine, Ser: 0.98 mg/dL (ref 0.61–1.24)
GFR, Estimated: >60 mL/min (ref >60)
Glucose, Bld: 146 mg/dL — ABNORMAL HIGH (ref 70–99)
Potassium: 4.2 mmol/L (ref 3.5–5.1)
Sodium: 133 mmol/L — ABNORMAL LOW (ref 135–145)

## 2022-08-20 LAB — CBC
HCT: 50.1 % (ref 39.0–52.0)
Hemoglobin: 18 g/dL — ABNORMAL HIGH (ref 13.0–17.0)
MCH: 35.7 pg — ABNORMAL HIGH (ref 26.0–34.0)
MCHC: 35.9 g/dL (ref 30.0–36.0)
MCV: 99.4 fL (ref 80.0–100.0)
Platelets: 228 10*3/uL (ref 150–400)
RBC: 5.04 MIL/uL (ref 4.22–5.81)
RDW: 12.5 % (ref 11.5–15.5)
WBC: 8 10*3/uL (ref 4.0–10.5)
nRBC: 0 % (ref 0.0–0.2)

## 2022-08-21 ENCOUNTER — Other Ambulatory Visit: Payer: Self-pay | Admitting: Family Medicine

## 2022-08-21 ENCOUNTER — Ambulatory Visit (HOSPITAL_COMMUNITY): Admission: RE | Admit: 2022-08-21 | Payer: Medicare Other | Source: Home / Self Care | Admitting: Orthopedic Surgery

## 2022-08-21 ENCOUNTER — Encounter (HOSPITAL_COMMUNITY): Admission: RE | Payer: Self-pay | Source: Home / Self Care

## 2022-08-21 ENCOUNTER — Telehealth: Payer: Self-pay | Admitting: *Deleted

## 2022-08-21 DIAGNOSIS — E7800 Pure hypercholesterolemia, unspecified: Secondary | ICD-10-CM

## 2022-08-21 DIAGNOSIS — M1611 Unilateral primary osteoarthritis, right hip: Secondary | ICD-10-CM

## 2022-08-21 SURGERY — ARTHROPLASTY, HIP, TOTAL, ANTERIOR APPROACH
Anesthesia: Spinal | Site: Hip | Laterality: Right

## 2022-08-21 NOTE — Telephone Encounter (Signed)
Cardiac Catheterization scheduled at [facility] for: Wednesday August 22, 2022 8:30 AM Arrival time [facility] Main Entrance A at: 6:30 AM  Nothing to eat after midnight prior to procedure, clear liquids until 5 AM day of procedure.  Medication instructions: -Hold:  Metformin-day of procedure and 48 hours post procedure  Farxiga-AM of procedure  Olmesartan-HCT-AM of procedure  -Other usual morning medications can be taken with sips of water including aspirin 81 mg.  Confirmed patient has responsible adult to drive home post procedure and be with patient first 24 hours after arriving home.  Plan to go home the same day, you will only stay overnight if medically necessary.  Reviewed procedure instructions with patient.

## 2022-08-22 ENCOUNTER — Encounter (HOSPITAL_COMMUNITY): Payer: Self-pay | Admitting: Interventional Cardiology

## 2022-08-22 ENCOUNTER — Encounter (HOSPITAL_COMMUNITY)
Admission: RE | Disposition: A | Discharge status: Home / Self Care | Payer: Self-pay | Source: Home / Self Care | Attending: Interventional Cardiology

## 2022-08-22 ENCOUNTER — Other Ambulatory Visit: Payer: Self-pay

## 2022-08-22 ENCOUNTER — Ambulatory Visit (HOSPITAL_COMMUNITY)
Admission: RE | Admit: 2022-08-22 | Discharge: 2022-08-22 | Disposition: A | Discharge status: Home / Self Care | Payer: Medicare Other | Attending: Interventional Cardiology | Admitting: Interventional Cardiology

## 2022-08-22 DIAGNOSIS — Z87891 Personal history of nicotine dependence: Secondary | ICD-10-CM | POA: Diagnosis not present

## 2022-08-22 DIAGNOSIS — Z7984 Long term (current) use of oral hypoglycemic drugs: Secondary | ICD-10-CM | POA: Diagnosis not present

## 2022-08-22 DIAGNOSIS — I10 Essential (primary) hypertension: Secondary | ICD-10-CM | POA: Diagnosis not present

## 2022-08-22 DIAGNOSIS — J449 Chronic obstructive pulmonary disease, unspecified: Secondary | ICD-10-CM | POA: Diagnosis not present

## 2022-08-22 DIAGNOSIS — E785 Hyperlipidemia, unspecified: Secondary | ICD-10-CM | POA: Diagnosis not present

## 2022-08-22 DIAGNOSIS — F1010 Alcohol abuse, uncomplicated: Secondary | ICD-10-CM | POA: Insufficient documentation

## 2022-08-22 DIAGNOSIS — R0609 Other forms of dyspnea: Secondary | ICD-10-CM | POA: Diagnosis not present

## 2022-08-22 DIAGNOSIS — E119 Type 2 diabetes mellitus without complications: Secondary | ICD-10-CM | POA: Diagnosis not present

## 2022-08-22 DIAGNOSIS — Z79899 Other long term (current) drug therapy: Secondary | ICD-10-CM | POA: Insufficient documentation

## 2022-08-22 DIAGNOSIS — I2510 Atherosclerotic heart disease of native coronary artery without angina pectoris: Secondary | ICD-10-CM | POA: Diagnosis not present

## 2022-08-22 DIAGNOSIS — R9439 Abnormal result of other cardiovascular function study: Secondary | ICD-10-CM | POA: Diagnosis not present

## 2022-08-22 DIAGNOSIS — R000 Tachycardia, unspecified: Secondary | ICD-10-CM | POA: Diagnosis not present

## 2022-08-22 HISTORY — PX: LEFT HEART CATH AND CORONARY ANGIOGRAPHY: CATH118249

## 2022-08-22 LAB — GLUCOSE, CAPILLARY: Glucose-Capillary: 154 mg/dL — ABNORMAL HIGH (ref 70–99)

## 2022-08-22 SURGERY — LEFT HEART CATH AND CORONARY ANGIOGRAPHY
Anesthesia: LOCAL

## 2022-08-22 MED ORDER — VERAPAMIL HCL 2.5 MG/ML IV SOLN
INTRAVENOUS | Status: DC | PRN
  Administered 2022-08-22 (×2): 10 mL via INTRA_ARTERIAL

## 2022-08-22 MED ORDER — SODIUM CHLORIDE 0.9% FLUSH: 3.0000 mL | INTRAVENOUS | Status: DC | PRN

## 2022-08-22 MED ORDER — LABETALOL HCL 5 MG/ML IV SOLN: 10.0000 mg | INTRAVENOUS | Status: DC | PRN

## 2022-08-22 MED ORDER — ONDANSETRON HCL 4 MG/2ML IJ SOLN: 4.0000 mg | Freq: Four times a day (QID) | INTRAMUSCULAR | Status: DC | PRN

## 2022-08-22 MED ORDER — FENTANYL CITRATE (PF) 100 MCG/2ML IJ SOLN
INTRAMUSCULAR | Status: AC
  Filled 2022-08-22: qty 2

## 2022-08-22 MED ORDER — SODIUM CHLORIDE 0.9 % WEIGHT BASED INFUSION
3.0000 mL/kg/h | INTRAVENOUS | Status: AC
  Administered 2022-08-22: 3 mL/kg/h via INTRAVENOUS

## 2022-08-22 MED ORDER — ACETAMINOPHEN 325 MG PO TABS: 650.0000 mg | ORAL_TABLET | ORAL | Status: DC | PRN

## 2022-08-22 MED ORDER — HEPARIN SODIUM (PORCINE) 1000 UNIT/ML IJ SOLN
INTRAMUSCULAR | Status: DC | PRN
  Administered 2022-08-22: 5000 [IU] via INTRAVENOUS

## 2022-08-22 MED ORDER — HEPARIN (PORCINE) IN NACL 1000-0.9 UT/500ML-% IV SOLN
INTRAVENOUS | Status: DC | PRN
  Administered 2022-08-22 (×2): 500 mL

## 2022-08-22 MED ORDER — ASPIRIN 81 MG PO CHEW: 81.0000 mg | CHEWABLE_TABLET | ORAL | Status: DC

## 2022-08-22 MED ORDER — MIDAZOLAM HCL 2 MG/2ML IJ SOLN
INTRAMUSCULAR | Status: DC | PRN
  Administered 2022-08-22: 1 mg via INTRAVENOUS
  Administered 2022-08-22: 2 mg via INTRAVENOUS

## 2022-08-22 MED ORDER — FENTANYL CITRATE (PF) 100 MCG/2ML IJ SOLN
INTRAMUSCULAR | Status: DC | PRN
  Administered 2022-08-22 (×2): 25 ug via INTRAVENOUS

## 2022-08-22 MED ORDER — SODIUM CHLORIDE 0.9 % IV SOLN: INTRAVENOUS | Status: AC

## 2022-08-22 MED ORDER — SODIUM CHLORIDE 0.9 % WEIGHT BASED INFUSION: 1.0000 mL/kg/h | INTRAVENOUS | Status: DC

## 2022-08-22 MED ORDER — IOHEXOL 350 MG/ML SOLN
INTRAVENOUS | Status: DC | PRN
  Administered 2022-08-22: 70 mL

## 2022-08-22 MED ORDER — HYDRALAZINE HCL 20 MG/ML IJ SOLN: 10.0000 mg | INTRAMUSCULAR | Status: DC | PRN

## 2022-08-22 MED ORDER — LIDOCAINE HCL (PF) 1 % IJ SOLN
INTRAMUSCULAR | Status: AC
  Filled 2022-08-22: qty 30

## 2022-08-22 MED ORDER — VERAPAMIL HCL 2.5 MG/ML IV SOLN
INTRAVENOUS | Status: AC
  Filled 2022-08-22: qty 2

## 2022-08-22 MED ORDER — SODIUM CHLORIDE 0.9 % IV SOLN: 250.0000 mL | INTRAVENOUS | Status: DC | PRN

## 2022-08-22 MED ORDER — HEPARIN SODIUM (PORCINE) 1000 UNIT/ML IJ SOLN
INTRAMUSCULAR | Status: AC
  Filled 2022-08-22: qty 10

## 2022-08-22 MED ORDER — MIDAZOLAM HCL 2 MG/2ML IJ SOLN
INTRAMUSCULAR | Status: AC
  Filled 2022-08-22: qty 2

## 2022-08-22 MED ORDER — SODIUM CHLORIDE 0.9% FLUSH: 3.0000 mL | Freq: Two times a day (BID) | INTRAVENOUS | Status: DC

## 2022-08-22 MED ORDER — LIDOCAINE HCL (PF) 1 % IJ SOLN
INTRAMUSCULAR | Status: DC | PRN
  Administered 2022-08-22: 5 mL via INTRADERMAL

## 2022-08-22 SURGICAL SUPPLY — 10 items
CATH 5FR JL3.5 JR4 ANG PIG MP (CATHETERS) IMPLANT
DEVICE RAD COMP TR BAND LRG (VASCULAR PRODUCTS) IMPLANT
GLIDESHEATH SLEND SS 6F .021 (SHEATH) IMPLANT
GUIDEWIRE INQWIRE 1.5J.035X260 (WIRE) IMPLANT
INQWIRE 1.5J .035X260CM (WIRE) ×1
KIT HEART LEFT (KITS) ×1 IMPLANT
PACK CARDIAC CATHETERIZATION (CUSTOM PROCEDURE TRAY) ×1 IMPLANT
SYR MEDRAD MARK 7 150ML (SYRINGE) ×1 IMPLANT
TRANSDUCER W/STOPCOCK (MISCELLANEOUS) ×1 IMPLANT
TUBING CIL FLEX 10 FLL-RA (TUBING) ×1 IMPLANT

## 2022-08-22 NOTE — Interval H&P Note (Signed)
Cath Lab Visit (complete for each Cath Lab visit)  Clinical Evaluation Leading to the Procedure:   ACS: No.  Non-ACS:    Anginal Classification: CCS II  Anti-ischemic medical therapy: Maximal Therapy (2 or more classes of medications)  Non-Invasive Test Results: Intermediate-risk stress test findings: cardiac mortality 1-3%/year  Prior CABG: No previous CABG  Eval prior to hip surgery.  Patient reports stage 3 COPD and chronic DOE.  No chest pain.     History and Physical Interval Note:  08/22/2022 8:38 AM  Frank Rosales  has presented today for surgery, with the diagnosis of dyspnea on exertion.  The various methods of treatment have been discussed with the patient and family. After consideration of risks, benefits and other options for treatment, the patient has consented to  Procedure(s): LEFT HEART CATH AND CORONARY ANGIOGRAPHY (N/A) as a surgical intervention.  The patient's history has been reviewed, patient examined, no change in status, stable for surgery.  I have reviewed the patient's chart and labs.  Questions were answered to the patient's satisfaction.     Lance Muss

## 2022-08-24 ENCOUNTER — Ambulatory Visit: Payer: Self-pay | Admitting: Emergency Medicine

## 2022-08-24 NOTE — Heart Team MDD (Addendum)
   Heart Team Multi-Disciplinary Discussion  Patient: Frank Rosales  DOB: 1955/10/01  MRN: [id number]   Date: 08/24/2022  12:25 PM    Attendees: Interventional Cardiology: Bryan Lemma, MD Yates Decamp, MD Truett Mainland, MD Peter Sw[place], MD Yvonne Kendall, MD Everette Rank, MD  Cardiothoracic Surgery: Brynda Greathouse, MD     Patient History: 67 y.o. male with a hx of dyspnea on exertion, COPD, type 2 diabetes, hypertension, former smoker, ETOH abuse, and hyperlipidemia. Pt has denied angina.    Risk Factors: Hypertension Tobacco Abuse COPD Hyperlipidemia Diabetes Mellitus     Review of Prior Angiography and PCI Procedures: CT scan of chest in August 2023, Lexiscan on 08/13/22, and LHC on 08/22/22 reviewed and discussed in detail including CT scan revealed severe coronary artery calcifications.  Lexiscan 08/13/2022: Stress ECG is negative for ischemia. Patient experienced chest pain during the stress test which resolved within a few minutes into the test. LV perfusion is abnormal.There is a medium sized reversible defect with mild reduction in uptake present in the apical to basal septal location consistent with ischemia. There is a small sized reversible defect with mild reduction in uptake present in the apex consistent with ischemia. There is a medium sized defect with mild reduction in uptake present in the apical to basal inferior location consistent with ischemia. Left ventricular function is normal. Nuclear stress EF: 64 %. Findings are consistent with ischemia. LHC showed diffuse disease: left dominant system with very large circumflex, LAD small with severe ostial, mid and apical disease, moderate atherosclerosis throughout the small vessel. There appears to be right to left collaterals feeding a septal perforator branch.     Discussion: After presentation, consideration of treatment options occurred. Given that the patient has denied CP and has diffuse disease  noted on LHC, the consensus from team was for medical management as the preferred treatment option.    Recommendations: Medical Therapy      Alison Murray, RN  08/24/2022 12:25 PM    I agree with the above summary.  The patient's testing was for a preoperative evaluation before hip surgery.  Significant disease was present in an OM branch and a diffusely diseased, small LAD.  The group decided there were no good revascularization options of the LAD, either surgically or percutaneously, due to small size of LAD and diffuse disease.  OM PCI was not indicated due to lack of symptoms.  Medical therapy with beta blocker followed by hip surgery was the recommendation.    Corky Crafts, MD

## 2022-09-06 ENCOUNTER — Telehealth: Payer: Self-pay | Admitting: Family Medicine

## 2022-09-06 ENCOUNTER — Ambulatory Visit (INDEPENDENT_AMBULATORY_CARE_PROVIDER_SITE_OTHER): Payer: Medicare Other | Admitting: Family Medicine

## 2022-09-06 ENCOUNTER — Encounter: Payer: Self-pay | Admitting: Family Medicine

## 2022-09-06 VITALS — BP 124/71 | HR 82 | Temp 98.6°F | Ht 68.0 in | Wt 208.8 lb

## 2022-09-06 DIAGNOSIS — L309 Dermatitis, unspecified: Secondary | ICD-10-CM

## 2022-09-06 MED ORDER — DUPIXENT 300 MG/2ML ~~LOC~~ SOSY: 300.0000 mg | PREFILLED_SYRINGE | SUBCUTANEOUS | 3 refills | Status: DC

## 2022-09-06 MED ORDER — DUPIXENT 300 MG/2ML ~~LOC~~ SOSY
300.0000 mg | PREFILLED_SYRINGE | SUBCUTANEOUS | 3 refills | Status: DC
Start: 2022-09-06 — End: 2023-11-21

## 2022-09-06 NOTE — Telephone Encounter (Signed)
Please let the patient know that I sent their prescription to their pharmacy. Thanks, WS 

## 2022-09-06 NOTE — Progress Notes (Signed)
Subjective:  Patient ID: Frank Rosales, male    DOB: 08-Jan-1956  Age: 67 y.o. MRN: [id number]  CC: Medication Refill   HPI Frank Rosales presents for need for eczema prevention treatment. Also several lesions on scalp - treated in past by Dr. Jorja Loa who retired. Takes dupixent. Denies any adverse reaction. Needs refill.     09/06/2022   11:19 AM 08/06/2022    3:50 PM 06/13/2022    9:11 AM  Depression screen PHQ 2/9  Decreased Interest 0 0 0  Down, Depressed, Hopeless 0 0 0  PHQ - 2 Score 0 0 0    History Frank Rosales has a past medical history of Anxiety, Arthritis, Asthma, Atypical nevus (03/27/2011), Atypical nevus (01/20/2007), Colon polyps, COPD (chronic obstructive pulmonary disease) (HCC), Diabetes mellitus without complication (HCC), High blood pressure, High cholesterol, Hilar adenopathy, Hypothyroidism, Multiple rib fractures (08/2016), Nodular basal cell carcinoma (BCC) (02/18/2020), Nodular basal cell carcinoma (BCC) (07/04/2021), Nodular basal cell carcinoma (BCC) (07/04/2021), SCCA (squamous cell carcinoma) of skin (02/18/2020), SCCA (squamous cell carcinoma) of skin (02/18/2020), SCCA (squamous cell carcinoma) of skin (02/18/2020), Squamous cell carcinoma in situ (SCCIS) (01/09/2016), and Superficial nodular basal cell carcinoma (BCC) (01/09/2016).   He has a past surgical history that includes Knee arthroscopy (Right, 05/14/2010); Wisdom tooth extraction; epidural injections; Fixation kyphoplasty lumbar spine; Back surgery; Video assisted thoracoscopy (Left, 08/22/2016); Pleural effusion drainage (Left, 08/22/2016); Rib plating (Left, 08/22/2016); and LEFT HEART CATH AND CORONARY ANGIOGRAPHY (N/A, 08/22/2022).   His family history includes Diabetes in his mother; Emphysema in his father; Heart disease in his brother; Heart failure in his mother; Lung cancer in his father.He reports that he quit smoking about 4 years ago. His smoking use included cigarettes. He has a 67.50 pack-year  smoking history. He has never been exposed to tobacco smoke. He has never used smokeless tobacco. He reports current alcohol use of about 18.0 standard drinks of alcohol per week. He reports that he does not use drugs.    ROS Review of Systems  Constitutional:  Negative for fever.  Respiratory:  Negative for shortness of breath.   Cardiovascular:  Negative for chest pain.  Musculoskeletal:  Negative for arthralgias.  Skin:  Negative for rash.    Objective:  BP 124/71   Pulse 82   Temp 98.6 F (37 C)   Ht 5\' 8"  (1.727 m)   Wt 208 lb 12.8 oz (94.7 kg)   SpO2 92%   BMI 31.75 kg/m   BP Readings from Last 3 Encounters:  09/06/22 124/71  08/22/22 135/76  08/16/22 135/76    Wt Readings from Last 3 Encounters:  09/06/22 208 lb 12.8 oz (94.7 kg)  08/22/22 210 lb (95.3 kg)  08/16/22 210 lb 3.2 oz (95.3 kg)     Physical Exam Vitals reviewed.  Constitutional:      Appearance: He is well-developed.  HENT:     Head: Normocephalic and atraumatic.     Right Ear: External ear normal.     Left Ear: External ear normal.     Mouth/Throat:     Pharynx: No oropharyngeal exudate or posterior oropharyngeal erythema.  Eyes:     Pupils: Pupils are equal, round, and reactive to light.  Cardiovascular:     Rate and Rhythm: Normal rate and regular rhythm.     Heart sounds: No murmur heard. Pulmonary:     Effort: No respiratory distress.     Breath sounds: Normal breath sounds.  Musculoskeletal:     Cervical back:  Normal range of motion and neck supple.  Neurological:     Mental Status: He is alert and oriented to person, place, and time.       Assessment & Plan:   Frank Rosales was seen today for medication refill.  Diagnoses and all orders for this visit:  Eczema, unspecified type -     dupilumab (DUPIXENT) 300 MG/2ML prefilled syringe; Inject 300 mg into the skin every 14 (fourteen) days. Starting at day 15 for maintenance.  Other orders -     Discontinue: dupilumab  (DUPIXENT) 300 MG/2ML prefilled syringe; Inject 300 mg into the skin every 14 (fourteen) days. Starting at day 15 for maintenance. -     Discontinue: dupilumab (DUPIXENT) 300 MG/2ML prefilled syringe; Inject 300 mg into the skin every 14 (fourteen) days. Starting at day 15 for maintenance.       I am having Frank Rosales maintain his Vitamin D, cyanocobalamin, folic acid, famotidine, Vitamin C, albuterol, albuterol, Breztri Aerosphere, dapagliflozin propanediol, diphenhydramine-acetaminophen, OneTouch Delica Lancets 33G, levothyroxine, metFORMIN, NIFEdipine, olmesartan-hydrochlorothiazide, tamsulosin, aspirin EC, Krill Oil, bisoprolol, atorvastatin, and Dupixent. We will continue to administer sodium chloride flush.  Allergies as of 09/06/2022   No Known Allergies      Medication List        Accurate as of September 06, 2022 11:59 PM. If you have any questions, ask your nurse or doctor.          albuterol 108 (90 Base) MCG/ACT inhaler Commonly known as: ProAir HFA Inhale 2 puffs into the lungs every 6 (six) hours.   albuterol (2.5 MG/3ML) 0.083% nebulizer solution Commonly known as: PROVENTIL Take 3 mLs (2.5 mg total) by nebulization every 4 (four) hours as needed for wheezing or shortness of breath.   aspirin EC 81 MG tablet Take 81 mg by mouth daily. Swallow whole.   atorvastatin 40 MG tablet Commonly known as: LIPITOR TAKE 1 TABLET BY MOUTH DAILY FOR CHOLESTEROL   bisoprolol 5 MG tablet Commonly known as: ZEBETA Take 0.5 tablets (2.5 mg total) by mouth daily.   Breztri Aerosphere 160-9-4.8 MCG/ACT Aero Generic drug: Budeson-Glycopyrrol-Formoterol Inhale 2 puffs into the lungs 2 (two) times daily.   cyanocobalamin 1000 MCG tablet Commonly known as: VITAMIN B12 Take 1,000 mcg by mouth daily.   dapagliflozin propanediol 10 MG Tabs tablet Commonly known as: Farxiga Take 1 tablet (10 mg total) by mouth daily before breakfast.   diphenhydramine-acetaminophen 25-500  MG Tabs tablet Commonly known as: TYLENOL PM Take 1 tablet by mouth at bedtime.   Dupixent 300 MG/2ML prefilled syringe Generic drug: dupilumab Inject 300 mg into the skin every 14 (fourteen) days. Starting at day 15 for maintenance.   famotidine 20 MG tablet Commonly known as: PEPCID Take 20 tablets by mouth at bedtime.   folic acid 1 MG tablet Commonly known as: FOLVITE Take 1 tablet (1 mg total) by mouth daily. What changed: additional instructions   Krill Oil 500 MG Caps Take 500 mg by mouth daily.   levothyroxine 50 MCG tablet Commonly known as: SYNTHROID Take 1 tablet (50 mcg total) by mouth daily before breakfast.   metFORMIN 750 MG 24 hr tablet Commonly known as: GLUCOPHAGE-XR Take 2 tablets (1,500 mg total) by mouth daily with breakfast.   NIFEdipine 60 MG 24 hr tablet Commonly known as: ADALAT CC Take 1 tablet (60 mg total) by mouth 2 (two) times daily. What changed:  how much to take when to take this   olmesartan-hydrochlorothiazide 40-25 MG tablet Commonly known  asHeywood Footman HCT Take 1 tablet by mouth daily.   OneTouch Delica Lancets 33G Misc Check BS up to 4 x daily Dx E11.9   tamsulosin 0.4 MG Caps capsule Commonly known as: FLOMAX Take 2 capsules (0.8 mg total) by mouth at bedtime. For urine flow and prostate   Vitamin C 500 MG Caps Take 500 mg by mouth daily.   Vitamin D 125 MCG (5000 UT) Caps Take 5,000 Units by mouth daily.         Follow-up: No follow-ups on file.  Mechele Claude, M.D.

## 2022-09-07 NOTE — Telephone Encounter (Signed)
Contacted pharmacy. Rx corrected to pen injectors

## 2022-09-07 NOTE — Telephone Encounter (Signed)
Kara Mead called from pharmacy stating that they received the syringe Rx for pt but wanted to clarify on it because on their end it looks like pt uses the pens.   Can contact her at [phone] ext [phone]

## 2022-09-11 ENCOUNTER — Ambulatory Visit: Payer: Medicare Other | Attending: Internal Medicine

## 2022-09-11 DIAGNOSIS — R0609 Other forms of dyspnea: Secondary | ICD-10-CM

## 2022-09-11 LAB — ECHOCARDIOGRAM COMPLETE
AR max vel: 2.24 cm2
AV Area VTI: 2.42 cm2
AV Area mean vel: 2.27 cm2
AV Mean grad: 5 mmHg
AV Peak grad: 8.8 mmHg
Ao pk vel: 1.48 m/s
Area-P 1/2: 3.27 cm2
Calc EF: 63.9 %
S' Lateral: 3.3 cm
Single Plane A2C EF: 65.5 %
Single Plane A4C EF: 63.4 %

## 2022-09-12 ENCOUNTER — Encounter: Payer: Self-pay | Admitting: Family Medicine

## 2022-09-14 ENCOUNTER — Telehealth: Payer: Self-pay | Admitting: *Deleted

## 2022-09-14 NOTE — Telephone Encounter (Signed)
-----   Message from Marjo Bicker, MD sent at 09/13/2022 12:36 PM EDT ----- Normal pumping function of the heart and no valve abnormalities.

## 2022-09-14 NOTE — Telephone Encounter (Signed)
  Lesle Chris, LPN 05/17/863  7:84 PM EDT Back to Top    Notified, copy to pcp.

## 2022-09-18 ENCOUNTER — Ambulatory Visit: Payer: Medicare Other | Attending: Nurse Practitioner | Admitting: Nurse Practitioner

## 2022-09-18 ENCOUNTER — Encounter: Payer: Self-pay | Admitting: Nurse Practitioner

## 2022-09-18 VITALS — BP 124/72 | HR 75 | Ht 68.0 in | Wt 211.4 lb

## 2022-09-18 DIAGNOSIS — Z79899 Other long term (current) drug therapy: Secondary | ICD-10-CM

## 2022-09-18 DIAGNOSIS — Z01810 Encounter for preprocedural cardiovascular examination: Secondary | ICD-10-CM

## 2022-09-18 DIAGNOSIS — R0609 Other forms of dyspnea: Secondary | ICD-10-CM

## 2022-09-18 DIAGNOSIS — E785 Hyperlipidemia, unspecified: Secondary | ICD-10-CM | POA: Diagnosis not present

## 2022-09-18 DIAGNOSIS — J449 Chronic obstructive pulmonary disease, unspecified: Secondary | ICD-10-CM | POA: Diagnosis not present

## 2022-09-18 DIAGNOSIS — I10 Essential (primary) hypertension: Secondary | ICD-10-CM | POA: Diagnosis not present

## 2022-09-18 DIAGNOSIS — F1010 Alcohol abuse, uncomplicated: Secondary | ICD-10-CM

## 2022-09-18 DIAGNOSIS — I2510 Atherosclerotic heart disease of native coronary artery without angina pectoris: Secondary | ICD-10-CM

## 2022-09-18 MED ORDER — BISOPROLOL FUMARATE 5 MG PO TABS: 2.5000 mg | ORAL_TABLET | Freq: Every day | ORAL | 1 refills | Status: DC

## 2022-09-18 MED ORDER — OLMESARTAN MEDOXOMIL-HCTZ 40-25 MG PO TABS
1.0000 | ORAL_TABLET | Freq: Every day | ORAL | 1 refills | Status: DC
Start: 2022-09-18 — End: 2023-03-21

## 2022-09-18 NOTE — Progress Notes (Signed)
Office Visit    Patient Name: Frank Rosales Date of Encounter: 09/18/2022  PCP:  Frank Claude, MD   [facility]  Cardiologist:  Frank Bicker, MD  Advanced Practice Provider:  Sharlene Dory, NP Electrophysiologist:  None   Chief Complaint    Frank Rosales is a 67 y.o. male with a hx of DOE, COPD, type 2 diabetes, hypertension, former smoker, ETOH abuse, and hyperlipidemia, who presents today for scheduled post left heart cath follow-up.   Past Medical History    Past Medical History:  Diagnosis Date   Anxiety    Arthritis    "neck; lower back; right hip; right knee" (10/18/2014)   Asthma    Atypical nevus 03/27/2011   Mid Back-Severe (Exc)   Atypical nevus 01/20/2007   Right Flank-Marked ([facility])   Colon polyps    COPD (chronic obstructive pulmonary disease) (HCC)    Diabetes mellitus without complication (HCC)    High blood pressure    High cholesterol    Hilar adenopathy    Hypothyroidism    Multiple rib fractures 08/2016   left side   Nodular basal cell carcinoma (BCC) 02/18/2020   Left Temporal Scalp   Nodular basal cell carcinoma (BCC) 07/04/2021   Left Zygomatic Area   Nodular basal cell carcinoma (BCC) 07/04/2021   Left Scaphoid Fossa (curet and 5FU)   SCCA (squamous cell carcinoma) of skin 02/18/2020   Left Nasal Sidewall (well diff)   SCCA (squamous cell carcinoma) of skin 02/18/2020   Right Temple (well diff)   SCCA (squamous cell carcinoma) of skin 02/18/2020   Left Temple (well diff)   Squamous cell carcinoma in situ (SCCIS) 01/09/2016   Right Low Lip Med (Cx3,5FU,LN2) and Top Scalp Sup (Cx3,5FU)   Superficial nodular basal cell carcinoma (BCC) 01/09/2016   Left Sideburn(Exc)   Past Surgical History:  Procedure Laterality Date   BACK SURGERY     epidural injections     FIXATION KYPHOPLASTY LUMBAR SPINE     "L1"   KNEE ARTHROSCOPY Right 05/14/2010   LEFT HEART CATH AND CORONARY ANGIOGRAPHY N/A  08/22/2022   Procedure: LEFT HEART CATH AND CORONARY ANGIOGRAPHY;  Surgeon: Frank Crafts, MD;  Location: [facility] INVASIVE CV LAB;  Service: Cardiovascular;  Laterality: N/A;   PLEURAL EFFUSION DRAINAGE Left 08/22/2016   Procedure: DRAINAGE OF HEMOTHORAX;  Surgeon: Frank Slot, MD;  Location: [facility] OR;  Service: Thoracic;  Laterality: Left;   RIB PLATING Left 08/22/2016   Procedure: RIB PLATING;  Surgeon: Frank Slot, MD;  Location: [facility] OR;  Service: Thoracic;  Laterality: Left;   VIDEO ASSISTED THORACOSCOPY Left 08/22/2016   Procedure: VIDEO ASSISTED THORACOSCOPY;  Surgeon: Frank Slot, MD;  Location: [facility] OR;  Service: Thoracic;  Laterality: Left;   WISDOM TOOTH EXTRACTION      Allergies  No Known Allergies  History of Present Illness    Frank Rosales is a 67 y.o. male with a PMH as mentioned above.  Last seen by Frank Rosales on August 08, 2022 for preoperative cardiovascular risk assessment for scheduled hip replacement and evaluation for DOE.  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  Denied any chest pain.  Former smoker, quit about 4 years ago.  Denied any illicit drug/alcohol use.  No reported history of heart attack, percutaneous coronary intervention, or CABG.  CT scan of chest in August 2023 showed severe coronary  artery calcifications.  Echo scheduled.  Lexiscan was abnormal.  Medium size reversible defect with mild reduction in uptake present in apical to basal septal location consistent with ischemia.  Also small size reversible defect with mild reduction in uptake present in apex consistent with ischemia.  Medium size defect with mild reduction in uptake present in apical to basal inferior location, consistent with ischemia.  Nuclear stress EF 64%.  Findings were consistent with ischemia, study was considered high risk.  Dr. Ancil Rosales recommended to be set up with LHC within 1 week.  I last  saw him for follow-up on August 16, 2022 based on abnormal Myoview results.  Continued to note dyspnea on exertion, stable. Denied any chest pain, palpitations, syncope, presyncope, dizziness, orthopnea, PND, swelling or significant weight changes, acute bleeding, or claudication.  Reported drinking 5-6 beers daily. Was set up for left heart cath and coronary angiography on August 22, 2022.  LAD was found to be small with severe ostial, mid, and apical disease.  There was moderate atherosclerosis throughout the small vessel, right to left collaterals feeding septal perforator branch.  Was found the LAD was not a straightforward target for revascularization, medical management recommended, EF normal.  Echocardiogram obtained 09/11/22 revealed normal EF, no RWMA, mildly calcified aortic valve without aortic stenosis.   Today he presents for left heart cath follow-up.  Denies any chest pain, DOE is stable.  Reviewed results of heart cath with him.  He is a former smoker, still continues to drink about "4-5 beers per day," denies any illicit drug use. Denies any chest pain, palpitations, syncope, presyncope, dizziness, orthopnea, PND, swelling or significant weight changes, acute bleeding, or claudication.  Stated he has recently stopped atorvastatin due to myalgias.  Wants to know if he can undergo pending hip surgery with Dr. Durene Rosales. Activity is limited for multiple reasons, issues with multiple joints and back, and DOE r/t COPD.    EKGs/Labs/Other Studies Reviewed:   The following studies were reviewed today:  EKG:  EKG is not ordered today.    Echocardiogram 09/11/2022: 1. Left ventricular ejection fraction, by estimation, is 60 to 65%. The  left ventricle has normal function. The left ventricle has no regional  wall motion abnormalities. Left ventricular diastolic parameters are  indeterminate. The average left  ventricular global longitudinal strain is -25.5 %. The global longitudinal  strain is  normal.   2. Right ventricular systolic function is normal. The right ventricular  size is mildly enlarged. Tricuspid regurgitation signal is inadequate for  assessing PA pressure.   3. The mitral valve is grossly normal, mild annular calcification.  Trivial mitral valve regurgitation.   4. The aortic valve is tricuspid. There is mild calcification of the  aortic valve. Aortic valve regurgitation is not visualized. No aortic  stenosis is present. Aortic valve mean gradient measures 5.0 mmHg.   5. The inferior vena cava is normal in size with greater than 50%  respiratory variability, suggesting right atrial pressure of 3 mmHg.   Comparison(s): Prior images unable to be directly viewed.    Left heart catheterization 08/22/2022:   2nd Mrg lesion is 75% stenosed.   1st Mrg lesion is 50% stenosed.   Ost LAD to Prox LAD lesion is 90% stenosed.  LAD is small and diffusely diseased.   Mid LAD lesion is 80% stenosed.   Dist LAD lesion is 70% stenosed.   The left ventricular systolic function is normal.   LV end diastolic pressure is normal.  The left ventricular ejection fraction is 55-65% by visual estimate.   There is no aortic valve stenosis.   Left dominant system with very large circumflex.  LAD is small with severe ostial, mid and apical disease.  There is moderate atherosclerosis throughout the small vessel.  There appears to be right to left collaterals feeding a septal perforator branch.  Given the diffuse disease, the LAD is not a straightforward target for revascularization either surgically or percutaneously.  Given that he has not had any angina, at this point will plan for medical therapy.  His circumflex is a very large vessel which is widely patent.  There is some branch vessel disease.   Lexiscan 08/13/2022:   Stress ECG is negative for ischemia. Patient experienced chest pain during the stress test which resolved within a few minutes into the test.   LV perfusion is  abnormal.There is a medium sized reversible defect with mild reduction in uptake present in the apical to basal septal location consistent with ischemia. There is a small sized reversible defect with mild reduction in uptake present in the apex consistent with ischemia. There is a medium sized defect with mild reduction in uptake present in the apical to basal inferior location consistent with ischemia.   Left ventricular function is normal. Nuclear stress EF: 64 %.   Findings are consistent with ischemia. The study is high risk.  Echo 2016: Study Conclusions   - Left ventricle: The cavity size was normal. Wall thickness was    normal. The estimated ejection fraction was 60%. Wall motion was    normal; there were no regional wall motion abnormalities.  - Right ventricle: The cavity size was normal. Systolic function    was normal.   Recent Labs: 06/13/2022: ALT 39; TSH 4.260 08/20/2022: BUN 12; Creatinine, Ser 0.98; Hemoglobin 18.0; Platelets 228; Potassium 4.2; Sodium 133  Recent Lipid Panel    Component Value Date/Time   CHOL 233 (H) 06/13/2022 0912   TRIG 102 06/13/2022 0912   TRIG 99 12/03/2013 0858   HDL 78 06/13/2022 0912   HDL 75 12/03/2013 0858   CHOLHDL 3.0 06/13/2022 0912   LDLCALC 137 (H) 06/13/2022 0912   LDLCALC 125 (H) 12/03/2013 0858    Risk Assessment/Calculations:    The 10-year ASCVD risk score (Arnett DK, et al., 2019) is: 28.2%   Values used to calculate the score:     Age: 31 years     Sex: Male     Is Non-Hispanic African American: No     Diabetic: Yes     Tobacco smoker: Yes     Systolic Blood Pressure: 124 mmHg     Is BP treated: Yes     HDL Cholesterol: 78 mg/dL     Total Cholesterol: 233 mg/dL   Home Medications   Current Meds  Medication Sig   albuterol (PROAIR HFA) 108 (90 Base) MCG/ACT inhaler Inhale 2 puffs into the lungs every 6 (six) hours.   albuterol (PROVENTIL) (2.5 MG/3ML) 0.083% nebulizer solution Take 3 mLs (2.5 mg total) by  nebulization every 4 (four) hours as needed for wheezing or shortness of breath.   Ascorbic Acid (VITAMIN C) 500 MG CAPS Take 500 mg by mouth daily.   aspirin EC 81 MG tablet Take 81 mg by mouth daily. Swallow whole.   Budeson-Glycopyrrol-Formoterol (BREZTRI AEROSPHERE) 160-9-4.8 MCG/ACT AERO Inhale 2 puffs into the lungs 2 (two) times daily.   Cholecalciferol (VITAMIN D) 125 MCG (5000 UT) CAPS Take 5,000 Units by mouth  daily.   cyanocobalamin (VITAMIN B12) 1000 MCG tablet Take 1,000 mcg by mouth daily.   dapagliflozin propanediol (FARXIGA) 10 MG TABS tablet Take 1 tablet (10 mg total) by mouth daily before breakfast.   diphenhydramine-acetaminophen (TYLENOL PM) 25-500 MG TABS tablet Take 1 tablet by mouth at bedtime.   dupilumab (DUPIXENT) 300 MG/2ML prefilled syringe Inject 300 mg into the skin every 14 (fourteen) days. Starting at day 15 for maintenance.   famotidine (PEPCID) 20 MG tablet Take 20 tablets by mouth at bedtime.   folic acid (FOLVITE) 1 MG tablet Take 1 tablet (1 mg total) by mouth daily. (Patient taking differently: Take 1 mg by mouth daily. 400 mcg)   Krill Oil 500 MG CAPS Take 500 mg by mouth daily.   levothyroxine (SYNTHROID) 50 MCG tablet Take 1 tablet (50 mcg total) by mouth daily before breakfast.   metFORMIN (GLUCOPHAGE-XR) 750 MG 24 hr tablet Take 2 tablets (1,500 mg total) by mouth daily with breakfast.   NIFEdipine (ADALAT CC) 60 MG 24 hr tablet Take 1 tablet (60 mg total) by mouth 2 (two) times daily. (Patient taking differently: Take 120 mg by mouth daily.)   OneTouch Delica Lancets 33G MISC Check BS up to 4 x daily Dx E11.9   tamsulosin (FLOMAX) 0.4 MG CAPS capsule Take 2 capsules (0.8 mg total) by mouth at bedtime. For urine flow and prostate   bisoprolol (ZEBETA) 5 MG tablet Take 0.5 tablets (2.5 mg total) by mouth daily.   olmesartan-hydrochlorothiazide (BENICAR HCT) 40-25 MG tablet Take 1 tablet by mouth daily.    Review of Systems    All other systems  reviewed and are otherwise negative except as noted above.  Physical Exam    VS:  BP 124/72   Pulse 75   Ht 5\' 8"  (1.727 m)   Wt 211 lb 6.4 oz (95.9 kg)   SpO2 93%   BMI 32.14 kg/m  , BMI Body mass index is 32.14 kg/m.  Wt Readings from Last 3 Encounters:  09/18/22 211 lb 6.4 oz (95.9 kg)  09/06/22 208 lb 12.8 oz (94.7 kg)  08/22/22 210 lb (95.3 kg)     GEN: Obese, 67 y.o. male in no acute distress HEENT: normal. Neck: Supple, no JVD, carotid bruits, or masses. Cardiac: S1/S2, RRR, no murmurs, rubs, or gallops. No clubbing, cyanosis, edema.  Radials/PT 2+ and equal bilaterally.  Respiratory:  Respirations regular and unlabored, clear to auscultation bilaterally. MS: No deformity or atrophy. Skin: Warm and dry, no rash. Neuro:  Strength and sensation are intact, occasional tremors Psych: Calm and pleasant  Assessment & Plan    CAD, medical management Recent LHC revealed LAD disease, was not found to be suitable for revascularization/stenting.  See report noted above.  Medical management recommended.  Denies any chest pain, breathing is stable. Continue aspirin, bisoprolol, Benicar, and nifedipine.  Will obtain CBC and BMET per protocol post cath.  Discussed limiting/avoiding alcohol use-see below. Heart healthy diet encouraged. Continue to follow with PCP.  2. HTN BP stable. Discussed SBP goal < 130. Does admit to [facility]. Discussed to monitor BP at home at least 2 hours after medications and sitting for 5-10 minutes.  Continue current medications-will provide refills per his request.  Heart healthy diet encouraged.   3. HLD Elevated LDL in 05/2022. Started on lipitor by PCP, however recently stopped d/t myalgias.  Stated he will restart and see how he does with this.  He will let us know within 1 to 2 weeks  if he starts to develop myalgias.  If that is the case, then would plan to reduce dose in half.  Recommend obtaining labs-FLP/LFT at next follow-up visit.  Heart healthy  encouraged.   5. ETOH abuse Discussed cutting back on alcohol.  He declined referral for support groups/therapy.  Continue to follow with PCP. Heart healthy diet encouraged.   6.  COPD/DOE Stable symptoms.  Denies any worsening shortness of breath or acute exacerbations.  Continue current medication regimen. Continue to follow with PCP and recommend discussing Lung Cancer CT scanning for evaluation.   7.  Preoperative cardiovascular risk assessment Mr. Sazama perioperative risk of a major cardiac event is 0.9% according to the Revised Cardiac Risk Index (RCRI).  Therefore, he is at low risk for perioperative complications.   His functional capacity is poor at 3.97 METs according to the Duke Activity Status Index (DASI). Recommendations: According to ACC/AHA guidelines, no further cardiovascular testing needed.  The patient may proceed to surgery at acceptable risk.   Antiplatelet and/or Anticoagulation Recommendations: Okay to hold Aspirin 7 days prior to surgery and resumed when felt safe to do so. Will route not to requesting party.   Disposition: Follow up with Frank P Mallipeddi, MD or APP as scheduled or sooner if anything changes.   Signed, Frank Dory, NP 09/18/2022, 10:30 AM [facility]

## 2022-09-18 NOTE — Patient Instructions (Addendum)
Medication Instructions:   Bisoprolol & Benicar refilled today Restart Atorvastatin 40mg  daily - give [place] update in 1-2 weeks on how joints are feeling Continue all other medications.     Labwork:  CBC, BMET - orders given Please do in the next 1-2 weeks Office will contact with results via phone, letter or mychart.     Testing/Procedures:  none  Follow-Up:  Keep follow up as planned with Dr. Jenene Slicker   Any Other Special Instructions Will Be Listed Below (If Applicable).   If you need a refill on your cardiac medications before your next appointment, please call your pharmacy.

## 2022-10-01 ENCOUNTER — Telehealth: Payer: Self-pay | Admitting: Nurse Practitioner

## 2022-10-01 ENCOUNTER — Other Ambulatory Visit: Payer: Medicare Other

## 2022-10-01 DIAGNOSIS — Z79899 Other long term (current) drug therapy: Secondary | ICD-10-CM | POA: Diagnosis not present

## 2022-10-01 DIAGNOSIS — I10 Essential (primary) hypertension: Secondary | ICD-10-CM | POA: Diagnosis not present

## 2022-10-01 NOTE — Telephone Encounter (Signed)
Pt c/o medication issue:  1. Name of Medication: Atorvastatin  2. How are you currently taking this medication (dosage and times per day)? 1 time a day   3. Are you having a reaction (difficulty breathing--STAT)?   4. What is your medication issue? He just started back taking this medicine about 2 weeks ago. He was supposed to let Lanora Manis know how he was doing. He said , at this time he have not had any problems. He said he had his lab work did today

## 2022-10-02 LAB — CBC
Hematocrit: 51.7 % — ABNORMAL HIGH (ref 37.5–51.0)
Hemoglobin: 18.3 g/dL — ABNORMAL HIGH (ref 13.0–17.7)
MCH: 34.9 pg — ABNORMAL HIGH (ref 26.6–33.0)
MCHC: 35.4 g/dL (ref 31.5–35.7)
MCV: 99 fL — ABNORMAL HIGH (ref 79–97)
Platelets: 249 10*3/uL (ref 150–450)
RBC: 5.24 x10E6/uL (ref 4.14–5.80)
RDW: 12.1 % (ref 11.6–15.4)
WBC: 7.7 10*3/uL (ref 3.4–10.8)

## 2022-10-02 LAB — BASIC METABOLIC PANEL
BUN/Creatinine Ratio: 12 (ref 10–24)
BUN: 11 mg/dL (ref 8–27)
CO2: 20 mmol/L (ref 20–29)
Calcium: 9.7 mg/dL (ref 8.6–10.2)
Chloride: 95 mmol/L — ABNORMAL LOW (ref 96–106)
Creatinine, Ser: 0.91 mg/dL (ref 0.76–1.27)
Glucose: 137 mg/dL — ABNORMAL HIGH (ref 70–99)
Potassium: 4.6 mmol/L (ref 3.5–5.2)
Sodium: 134 mmol/L (ref 134–144)
eGFR: 94 mL/min/{1.73_m2} (ref >59)

## 2022-10-02 NOTE — Telephone Encounter (Signed)
Patient notified and verbalized understanding. 

## 2022-10-09 ENCOUNTER — Telehealth: Payer: Self-pay | Admitting: Family Medicine

## 2022-10-09 NOTE — Telephone Encounter (Signed)
Reason for Referral:  Has the referral been discussed with the patient?: yes, on 4/25 per pt   Designated contact for the referral if not the patient (name/phone number):   Has the patient seen a specialist for this issue before?: yes but it needing a new one   If so, who (practice/provider)? dr tafeen  Does the patient have a provider or location preference for the referral?: no  Would the patient like to see previous specialist if applicable?

## 2022-10-10 ENCOUNTER — Telehealth: Payer: Self-pay | Admitting: *Deleted

## 2022-10-10 ENCOUNTER — Other Ambulatory Visit: Payer: Self-pay | Admitting: Family Medicine

## 2022-10-10 DIAGNOSIS — D485 Neoplasm of uncertain behavior of skin: Secondary | ICD-10-CM

## 2022-10-10 DIAGNOSIS — L309 Dermatitis, unspecified: Secondary | ICD-10-CM

## 2022-10-10 NOTE — Telephone Encounter (Signed)
HPI Frank Rosales presents for need for eczema prevention treatment. Also several lesions on scalp - treated in past by Dr. Jorja Loa who retired. Takes dupixent.

## 2022-10-10 NOTE — Telephone Encounter (Signed)
Lesle Chris, LPN 01/10/5620  3:08 PM EDT Back to Top    Notified, copy to pcp.

## 2022-10-10 NOTE — Telephone Encounter (Signed)
-----   Message from Eustace Moore, RN sent at 10/09/2022  4:56 PM EDT -----  ----- Message ----- From: Sharlene Dory, NP Sent: 10/06/2022  11:10 PM EDT To: Eustace Moore, RN  Stable labs.   Sharlene Dory, AGNP-C

## 2022-10-10 NOTE — Telephone Encounter (Signed)
For what problem does he need the referral?

## 2022-10-10 NOTE — Telephone Encounter (Signed)
Referral placed, as requested WS 

## 2022-10-11 ENCOUNTER — Other Ambulatory Visit: Payer: Self-pay | Admitting: Family Medicine

## 2022-10-11 DIAGNOSIS — E119 Type 2 diabetes mellitus without complications: Secondary | ICD-10-CM

## 2022-11-20 ENCOUNTER — Ambulatory Visit: Payer: Medicare Other | Attending: Internal Medicine | Admitting: Internal Medicine

## 2022-11-20 ENCOUNTER — Encounter: Payer: Self-pay | Admitting: Internal Medicine

## 2022-11-20 VITALS — BP 128/72 | HR 73 | Ht 68.0 in | Wt 211.0 lb

## 2022-11-20 DIAGNOSIS — I2510 Atherosclerotic heart disease of native coronary artery without angina pectoris: Secondary | ICD-10-CM | POA: Diagnosis not present

## 2022-11-20 NOTE — Patient Instructions (Signed)

## 2022-11-20 NOTE — Progress Notes (Signed)
Cardiology Office Note  Date: 11/20/2022   ID: Frank Rosales, DOB 12-Dec-1955, MRN [id number]  PCP:  Mechele Claude, MD  Cardiologist:  Marjo Bicker, MD Electrophysiologist:  None    History of Present Illness: Frank Rosales is a 67 y.o. male known to have COPD, HTN, DM 2, HLD is here for follow-up visit.  Patient had exposure to asbestos during his work years and as a result, has been having DOE. CT chest from 8/23 showed severe coronary artery calcifications. He was initially referred to cardiology clinic for preop cardiac risk stratification for hip replacement surgery. Due to METs less than 4, he underwent NM stress test that was high risk study based on chest pain during the test and perfusion defects. He subsequently underwent LHC that showed small LAD with severe ostial, mid and apical disease, wide and patent LCx with branch disease and normal RCA.  He is here for follow-up visit.  He has not undergone hip surgery yet.  He said he will need to reschedule the surgery.  He had 1 episode of chest tightness lasting for few seconds at rest but no chest pain with exertion.  Baseline DOE but no recent worsening.  No dizziness, palpitations, syncope and leg swelling.  Quit smoking around 40 years ago.  Past Medical History:  Diagnosis Date   Anxiety    Arthritis    "neck; lower back; right hip; right knee" (10/18/2014)   Asthma    Atypical nevus 03/27/2011   Mid Back-Severe (Exc)   Atypical nevus 01/20/2007   Right Flank-Marked ([facility])   Colon polyps    COPD (chronic obstructive pulmonary disease) (HCC)    Diabetes mellitus without complication (HCC)    High blood pressure    High cholesterol    Hilar adenopathy    Hypothyroidism    Multiple rib fractures 08/2016   left side   Nodular basal cell carcinoma (BCC) 02/18/2020   Left Temporal Scalp   Nodular basal cell carcinoma (BCC) 07/04/2021   Left Zygomatic Area   Nodular basal cell carcinoma (BCC)  07/04/2021   Left Scaphoid Fossa (curet and 5FU)   SCCA (squamous cell carcinoma) of skin 02/18/2020   Left Nasal Sidewall (well diff)   SCCA (squamous cell carcinoma) of skin 02/18/2020   Right Temple (well diff)   SCCA (squamous cell carcinoma) of skin 02/18/2020   Left Temple (well diff)   Squamous cell carcinoma in situ (SCCIS) 01/09/2016   Right Low Lip Med (Cx3,5FU,LN2) and Top Scalp Sup (Cx3,5FU)   Superficial nodular basal cell carcinoma (BCC) 01/09/2016   Left Sideburn(Exc)    Past Surgical History:  Procedure Laterality Date   BACK SURGERY     epidural injections     FIXATION KYPHOPLASTY LUMBAR SPINE     "L1"   KNEE ARTHROSCOPY Right 05/14/2010   LEFT HEART CATH AND CORONARY ANGIOGRAPHY N/A 08/22/2022   Procedure: LEFT HEART CATH AND CORONARY ANGIOGRAPHY;  Surgeon: Corky Crafts, MD;  Location: [facility];  Service: Cardiovascular;  Laterality: N/A;   PLEURAL EFFUSION DRAINAGE Left 08/22/2016   Procedure: DRAINAGE OF HEMOTHORAX;  Surgeon: Loreli Slot, MD;  Location: [facility] OR;  Service: Thoracic;  Laterality: Left;   RIB PLATING Left 08/22/2016   Procedure: RIB PLATING;  Surgeon: Loreli Slot, MD;  Location: [facility] OR;  Service: Thoracic;  Laterality: Left;   VIDEO ASSISTED THORACOSCOPY Left 08/22/2016   Procedure: VIDEO ASSISTED THORACOSCOPY;  Surgeon: Loreli Slot, MD;  Location: [facility] OR;  Service: Thoracic;  Laterality: Left;   WISDOM TOOTH EXTRACTION      Current Outpatient Medications  Medication Sig Dispense Refill   albuterol (PROAIR HFA) 108 (90 Base) MCG/ACT inhaler Inhale 2 puffs into the lungs every 6 (six) hours. 54 g 1   albuterol (PROVENTIL) (2.5 MG/3ML) 0.083% nebulizer solution Take 3 mLs (2.5 mg total) by nebulization every 4 (four) hours as needed for wheezing or shortness of breath. 240 mL 5   Ascorbic Acid (VITAMIN C) 500 MG CAPS Take 500 mg by mouth daily.     aspirin EC 81 MG tablet Take 81 mg by mouth daily. Swallow whole.      atorvastatin (LIPITOR) 40 MG tablet TAKE 1 TABLET BY MOUTH DAILY FOR CHOLESTEROL 100 tablet 2   bisoprolol (ZEBETA) 5 MG tablet Take 0.5 tablets (2.5 mg total) by mouth daily. 45 tablet 1   Budeson-Glycopyrrol-Formoterol (BREZTRI AEROSPHERE) 160-9-4.8 MCG/ACT AERO Inhale 2 puffs into the lungs 2 (two) times daily. 32.1 g 11   Cholecalciferol (VITAMIN D) 125 MCG (5000 UT) CAPS Take 5,000 Units by mouth daily.     cyanocobalamin (VITAMIN B12) 1000 MCG tablet Take 1,000 mcg by mouth daily.     dapagliflozin propanediol (FARXIGA) 10 MG TABS tablet Take 1 tablet (10 mg total) by mouth daily before breakfast. 90 tablet 5   diphenhydramine-acetaminophen (TYLENOL PM) 25-500 MG TABS tablet Take 1 tablet by mouth at bedtime.     dupilumab (DUPIXENT) 300 MG/2ML prefilled syringe Inject 300 mg into the skin every 14 (fourteen) days. Starting at day 15 for maintenance. 12 mL 3   famotidine (PEPCID) 20 MG tablet Take 20 tablets by mouth at bedtime.     folic acid (FOLVITE) 1 MG tablet Take 1 tablet (1 mg total) by mouth daily. (Patient taking differently: Take 1 mg by mouth daily. 400 mcg) 90 tablet 3   Krill Oil 500 MG CAPS Take 500 mg by mouth daily.     levothyroxine (SYNTHROID) 50 MCG tablet Take 1 tablet (50 mcg total) by mouth daily before breakfast. 100 tablet 3   metFORMIN (GLUCOPHAGE-XR) 750 MG 24 hr tablet TAKE 2 TABLETS BY MOUTH DAILY  WITH BREAKFAST 200 tablet 0   NIFEdipine (ADALAT CC) 60 MG 24 hr tablet Take 1 tablet (60 mg total) by mouth 2 (two) times daily. (Patient taking differently: Take 120 mg by mouth daily.) 200 tablet 3   olmesartan-hydrochlorothiazide (BENICAR HCT) 40-25 MG tablet Take 1 tablet by mouth daily. 100 tablet 1   OneTouch Delica Lancets 33G MISC Check BS up to 4 x daily Dx E11.9 400 each 3   tamsulosin (FLOMAX) 0.4 MG CAPS capsule Take 2 capsules (0.8 mg total) by mouth at bedtime. For urine flow and prostate 180 capsule 3   Current Facility-Administered Medications   Medication Dose Route Frequency Provider Last Rate Last Admin   sodium chloride flush (NS) 0.9 % injection 3 mL  3 mL Intravenous Q12H Sharlene Dory, NP       Allergies:  Patient has no known allergies.   Social History: The patient  reports that he quit smoking about 5 years ago. His smoking use included cigarettes. He has a 67.50 pack-year smoking history. He has never been exposed to tobacco smoke. He has never used smokeless tobacco. He reports current alcohol use of about 18.0 standard drinks of alcohol per week. He reports that he does not use drugs.   Family History: The patient's family history includes Diabetes in  his mother; Emphysema in his father; Heart disease in his brother; Heart failure in his mother; Lung cancer in his father.   ROS:  Please see the history of present illness. Otherwise, complete review of systems is positive for none.  All other systems are reviewed and negative.   Physical Exam: VS:  BP 128/72   Pulse 73   Ht 5\' 8"  (1.727 m)   Wt 211 lb (95.7 kg)   SpO2 92%   BMI 32.08 kg/m , BMI Body mass index is 32.08 kg/m.  Wt Readings from Last 3 Encounters:  11/20/22 211 lb (95.7 kg)  09/18/22 211 lb 6.4 oz (95.9 kg)  09/06/22 208 lb 12.8 oz (94.7 kg)    General: Patient appears comfortable at rest. HEENT: Conjunctiva and lids normal, oropharynx clear with moist mucosa. Neck: Supple, no elevated JVP or carotid bruits, no thyromegaly. Lungs: Clear to auscultation, nonlabored breathing at rest. Cardiac: Regular rate and rhythm, no S3 or significant systolic murmur, no pericardial rub. Abdomen: Soft, nontender, no hepatomegaly, bowel sounds present, no guarding or rebound. Extremities: No pitting edema, distal pulses 2+. Skin: Warm and dry. Musculoskeletal: No kyphosis. Neuropsychiatric: Alert and oriented x3, affect grossly appropriate.  ECG:  An ECG dated 08/08/22 was personally reviewed today and demonstrated:  Sinus tachycardia, HR 104 bpm and  RBBB  Recent Labwork: 06/13/2022: ALT 39; AST 36; TSH 4.260 10/01/2022: BUN 11; Creatinine, Ser 0.91; Hemoglobin 18.3; Platelets 249; Potassium 4.6; Sodium 134     Component Value Date/Time   CHOL 233 (H) 06/13/2022 0912   TRIG 102 06/13/2022 0912   TRIG 99 12/03/2013 0858   HDL 78 06/13/2022 0912   HDL 75 12/03/2013 0858   CHOLHDL 3.0 06/13/2022 0912   LDLCALC 137 (H) 06/13/2022 0912   LDLCALC 125 (H) 12/03/2013 0858     Assessment and Plan:  # Preop cardiac risk stratification for hip replacement -Due to METs less than 4, patient underwent NM stress test that was deemed to be high risk and subsequently underwent LHC that showed two-vessel CAD for which medical management is recommended (no PCI targets). EKG from 3/24 showed sinus tachycardia with RBBB. No interval angina or DOE. Patient is at a moderate risk for any perioperative cardiac complications. No further cardiac testing is indicated prior to proceeding with the planned procedure.  # Two-vessel CAD, no angina -Due to METs less than 4, he underwent NM stress test that was high risk study based on chest pain during the test and perfusion defects. He subsequently underwent LHC that showed small LAD with severe ostial, mid and apical disease, wide and patent LCx with branch disease and normal RCA.  -No interval angina or DOE.  Continue cardioprotective medications, aspirin 81 mg once daily and atorvastatin 40 mg nightly. -Patient might benefit from Ozempic due to DM 2, obesity type I and CAD.  Will discuss in the next clinic visit if the BMI continues to be more than 28.  # HLD, not at goal -Will continue atorvastatin 40 mg nightly, goal LDL less than 70.  Obtain lipid panel in the next clinic visit.  # HTN: Continue nifedipine 60 mg twice daily and olmesartan-HCTZ 40-25 mg once daily, HTN management per PCP.  # DM2: Currently on metformin 1500 mg once daily and Farxiga 10 mg once daily.  Follows up with PCP.  I have spent a  total of 45 minutes with patient reviewing chart, EKGs, labs and examining patient as well as establishing an assessment and plan that  was discussed with the patient.  > 50% of time was spent in direct patient care.      Medication Adjustments/Labs and Tests Ordered: Current medicines are reviewed at length with the patient today.  Concerns regarding medicines are outlined above.   Tests Ordered: No orders of the defined types were placed in this encounter.   Medication Changes: No orders of the defined types were placed in this encounter.   Disposition:  Follow up  6 months  Signed Jacob Cicero Verne Spurr, MD, 11/20/2022 11:55 AM    [facility] [address] [place], [place], [place] [address]

## 2022-11-28 ENCOUNTER — Other Ambulatory Visit: Payer: Self-pay | Admitting: Family Medicine

## 2022-11-29 ENCOUNTER — Other Ambulatory Visit: Payer: Self-pay | Admitting: Family Medicine

## 2022-11-29 ENCOUNTER — Telehealth: Payer: Self-pay | Admitting: Internal Medicine

## 2022-11-29 NOTE — Telephone Encounter (Signed)
   Patient Name: Frank Rosales  DOB: 1955-06-22 MRN: [id number]  Primary Cardiologist: Marjo Bicker, MD  Chart reviewed as part of pre-operative protocol coverage. Given past medical history and time since last visit, based on ACC/AHA guidelines, Frank Rosales is at acceptable risk for the planned procedure without further cardiovascular testing.   Patient was cleared for surgery by Dr. Jenene Slicker on 11/20/2022.  I will route this recommendation including the note from most recent office visit to the requesting party via Epic fax function and remove from pre-op pool.  Please call with questions.  Joylene Grapes, NP 11/29/2022, 2:00 PM

## 2022-11-29 NOTE — Telephone Encounter (Signed)
   Pre-operative Risk Assessment    Patient Name: Frank Rosales  DOB: 21-Jul-1955 MRN: [id number]      Request for Surgical Clearance    Procedure:   Right total hip arthroplasty  Date of Surgery:  Clearance TBD                                 Surgeon:  Dr. Durene Romans Surgeon's Group or Practice Name:  [facility] Phone number:  [phone] Fax number:  [phone]   Type of Clearance Requested:   - Medical  - Pharmacy:  Hold If applicable      Type of Anesthesia:  Spinal   Additional requests/questions:    SignedSeymour Bars   11/29/2022, 1:43 PM

## 2022-12-03 ENCOUNTER — Ambulatory Visit (INDEPENDENT_AMBULATORY_CARE_PROVIDER_SITE_OTHER): Payer: Medicare Other

## 2022-12-03 VITALS — Ht 68.0 in | Wt 211.0 lb

## 2022-12-03 DIAGNOSIS — Z1211 Encounter for screening for malignant neoplasm of colon: Secondary | ICD-10-CM

## 2022-12-03 DIAGNOSIS — Z0000 Encounter for general adult medical examination without abnormal findings: Secondary | ICD-10-CM | POA: Diagnosis not present

## 2022-12-03 NOTE — Patient Instructions (Signed)
Frank Rosales , Thank you for taking time to come for your Medicare Wellness Visit. I appreciate your ongoing commitment to your health goals. Please review the following plan we discussed and let me know if I can assist you in the future.   These are the goals we discussed:  Goals       DIET - INCREASE WATER INTAKE      Exercise 3x per week (30 min per time)      Hopes to lose weight and be able to get more active      Reduce alcohol intake      Do other activities such as visit a friend, taking a drive, exercising when feeling the urge to drink alcohol.       T2DM, COPD (pt-stated)      Current Barriers:  Unable to independently afford treatment regimen  Pharmacist Clinical Goal(s):  Over the next 90 days, patient will verbalize ability to afford treatment regimen maintain control of T2DM as evidenced by CONTINUED GLYCEMIC CONTROL  through collaboration with PharmD and provider.    Interventions: 1:1 collaboration with Mechele Claude, MD regarding development and update of comprehensive plan of care as evidenced by provider attestation and co-signature Inter-disciplinary care team collaboration (see longitudinal plan of care) Comprehensive medication review performed; medication list updated in electronic medical record  Diabetes: Ccontrolled; current treatment: FARXIGA 10MG , METFORMIN;  A1C 5.7% , GFR 88 Providing access to care for Farxiga Re-enrolled for 2023 farxiga via [employer] PATIENT ASSISTANCE Will ship to patient's home/3 month supply  Refills sent to program for the year [facility] escribe Current glucose readings: fasting glucose: 100-120, post prandial glucose: 140-160 Denies hypoglycemic/hyperglycemic symptoms Discussed meal planning options and Plate method for healthy eating Avoid sugary drinks and desserts Incorporate balanced protein, non starchy veggies, 1 serving of carbohydrate with each meal Increase water intake Increase physical activity as able Current  exercise: N/A Educated on [place], DIABETES MANAGEMENT Assessed patient finances. Patient approved for 2023 farxiga & breztri via [employer] PATIENT ASSISTANCE--medication to ship to patient's home in 7-10 business days   Chronic Obstructive Pulmonary Disease: Uncontrolled/controlled; current treatment:BREZTRI; albuterol rescue, dupixent GOLD Classification: III (sees Dr. Sherene Sires for pulm) Most recent Pulmonary Function Testing: TLC  Date Value Ref Range Status  12/21/2016 6.47 L Final   0 exacerbations requiring treatment in the last 6 months  Assessed patient finances. Patient approved for farxiga & breztri  [employer] PATIENT ASSISTANCE FOR BREZTRI   Patient Goals/Self-Care Activities Over the next 90 days, patient will:  - take medications as prescribed  Follow Up Plan: Telephone follow up appointment with care management team member scheduled for: 6 months         This is a list of the screening recommended for you and due dates:  Health Maintenance  Topic Date Due   Complete foot exam   Never done   Eye exam for diabetics  Never done   Zoster (Shingles) Vaccine (1 of 2) Never done   Colon Cancer Screening  02/23/2016   COVID-19 Vaccine (4 - 2023-24 season) 01/12/2022   Screening for Lung Cancer  01/02/2023   Hemoglobin A1C  12/12/2022   Flu Shot  12/13/2022   DTaP/Tdap/Td vaccine (3 - Td or Tdap) 02/07/2023   Yearly kidney health urinalysis for diabetes  06/14/2023   Yearly kidney function blood test for diabetes  10/01/2023   Medicare Annual Wellness Visit  12/03/2023   Hepatitis C Screening  Completed   HIV Screening  Completed  HPV Vaccine  Aged Out    Advanced directives: Advance directive discussed with you today. I have provided a copy for you to complete at home and have notarized. Once this is complete please bring a copy in to our office so we can scan it into your chart. Information on Advanced Care Planning can be found at [facility] [place] Advance  Health Care Directives Advance Health Care Directives ([email or web address]    Conditions/risks identified: Aim for 30 minutes of exercise or brisk walking, 6-8 glasses of water, and 5 servings of fruits and vegetables each day.   Next appointment: Follow up in one year for your annual wellness visit.   Preventive Care 107 Years and Older, Male  Preventive care refers to lifestyle choices and visits with your health care provider that can promote health and wellness. What does preventive care include? A yearly physical exam. This is also called an annual well check. Dental exams once or twice a year. Routine eye exams. Ask your health care provider how often you should have your eyes checked. Personal lifestyle choices, including: Daily care of your teeth and gums. Regular physical activity. Eating a healthy diet. Avoiding tobacco and drug use. Limiting alcohol use. Practicing safe sex. Taking low doses of aspirin every day. Taking vitamin and mineral supplements as recommended by your health care provider. What happens during an annual well check? The services and screenings done by your health care provider during your annual well check will depend on your age, overall health, lifestyle risk factors, and family history of disease. Counseling  Your health care provider may ask you questions about your: Alcohol use. Tobacco use. Drug use. Emotional well-being. Home and relationship well-being. Sexual activity. Eating habits. History of falls. Memory and ability to understand (cognition). Work and work Astronomer. Screening  You may have the following tests or measurements: Height, weight, and BMI. Blood pressure. Lipid and cholesterol levels. These may be checked every 5 years, or more frequently if you are over 32 years old. Skin check. Lung cancer screening. You may have this screening every year starting at age 15 if you have a 30-pack-year history of smoking and currently smoke or  have quit within the past 15 years. Fecal occult blood test (FOBT) of the stool. You may have this test every year starting at age 44. Flexible sigmoidoscopy or colonoscopy. You may have a sigmoidoscopy every 5 years or a colonoscopy every 10 years starting at age 72. Prostate cancer screening. Recommendations will vary depending on your family history and other risks. Hepatitis C blood test. Hepatitis B blood test. Sexually transmitted disease (STD) testing. Diabetes screening. This is done by checking your blood sugar (glucose) after you have not eaten for a while (fasting). You may have this done every 1-3 years. Abdominal aortic aneurysm (AAA) screening. You may need this if you are a current or former smoker. Osteoporosis. You may be screened starting at age 3 if you are at high risk. Talk with your health care provider about your test results, treatment options, and if necessary, the need for more tests. Vaccines  Your health care provider may recommend certain vaccines, such as: Influenza vaccine. This is recommended every year. Tetanus, diphtheria, and acellular pertussis (Tdap, Td) vaccine. You may need a Td booster every 10 years. Zoster vaccine. You may need this after age 44. Pneumococcal 13-valent conjugate (PCV13) vaccine. One dose is recommended after age 61. Pneumococcal polysaccharide (PPSV23) vaccine. One dose is recommended after age 8. Talk  to your health care provider about which screenings and vaccines you need and how often you need them. This information is not intended to replace advice given to you by your health care provider. Make sure you discuss any questions you have with your health care provider. Document Released: 05/27/2015 Document Revised: 01/18/2016 Document Reviewed: 03/01/2015 Elsevier Interactive Patient Education  2017 [employer].  Fall Prevention in the Home Falls can cause injuries. They can happen to people of all ages. There are many things  you can do to make your home safe and to help prevent falls. What can I do on the outside of my home? Regularly fix the edges of walkways and driveways and fix any cracks. Remove anything that might make you trip as you walk through a door, such as a raised step or threshold. Trim any bushes or trees on the path to your home. Use bright outdoor lighting. Clear any walking paths of anything that might make someone trip, such as rocks or tools. Regularly check to see if handrails are loose or broken. Make sure that both sides of any steps have handrails. Any raised decks and porches should have guardrails on the edges. Have any leaves, snow, or ice cleared regularly. Use sand or salt on walking paths during winter. Clean up any spills in your garage right away. This includes oil or grease spills. What can I do in the bathroom? Use night lights. Install grab bars by the toilet and in the tub and shower. Do not use towel bars as grab bars. Use non-skid mats or decals in the tub or shower. If you need to sit down in the shower, use a plastic, non-slip stool. Keep the floor dry. Clean up any water that spills on the floor as soon as it happens. Remove soap buildup in the tub or shower regularly. Attach bath mats securely with double-sided non-slip rug tape. Do not have throw rugs and other things on the floor that can make you trip. What can I do in the bedroom? Use night lights. Make sure that you have a light by your bed that is easy to reach. Do not use any sheets or blankets that are too big for your bed. They should not hang down onto the floor. Have a firm chair that has side arms. You can use this for support while you get dressed. Do not have throw rugs and other things on the floor that can make you trip. What can I do in the kitchen? Clean up any spills right away. Avoid walking on wet floors. Keep items that you use a lot in easy-to-reach places. If you need to reach something  above you, use a strong step stool that has a grab bar. Keep electrical cords out of the way. Do not use floor polish or wax that makes floors slippery. If you must use wax, use non-skid floor wax. Do not have throw rugs and other things on the floor that can make you trip. What can I do with my stairs? Do not leave any items on the stairs. Make sure that there are handrails on both sides of the stairs and use them. Fix handrails that are broken or loose. Make sure that handrails are as long as the stairways. Check any carpeting to make sure that it is firmly attached to the stairs. Fix any carpet that is loose or worn. Avoid having throw rugs at the top or bottom of the stairs. If you do have throw rugs,  attach them to the floor with carpet tape. Make sure that you have a light switch at the top of the stairs and the bottom of the stairs. If you do not have them, ask someone to add them for you. What else can I do to help prevent falls? Wear shoes that: Do not have high heels. Have rubber bottoms. Are comfortable and fit you well. Are closed at the toe. Do not wear sandals. If you use a stepladder: Make sure that it is fully opened. Do not climb a closed stepladder. Make sure that both sides of the stepladder are locked into place. Ask someone to hold it for you, if possible. Clearly mark and make sure that you can see: Any grab bars or handrails. First and last steps. Where the edge of each step is. Use tools that help you move around (mobility aids) if they are needed. These include: Canes. Walkers. Scooters. Crutches. Turn on the lights when you go into a dark area. Replace any light bulbs as soon as they burn out. Set up your furniture so you have a clear path. Avoid moving your furniture around. If any of your floors are uneven, fix them. If there are any pets around you, be aware of where they are. Review your medicines with your doctor. Some medicines can make you feel dizzy.  This can increase your chance of falling. Ask your doctor what other things that you can do to help prevent falls. This information is not intended to replace advice given to you by your health care provider. Make sure you discuss any questions you have with your health care provider. Document Released: 02/24/2009 Document Revised: 10/06/2015 Document Reviewed: 06/04/2014 Elsevier Interactive Patient Education  2017 [employer].

## 2022-12-03 NOTE — Progress Notes (Signed)
Subjective:   Frank Rosales is a 67 y.o. male who presents for Medicare Annual/Subsequent preventive examination.  Visit Complete: Virtual  I connected with  Frank Rosales on 12/03/22 by a audio enabled telemedicine application and verified that I am speaking with the correct person using two identifiers.  Patient Location: Home  Provider Location: Home Office  I discussed the limitations of evaluation and management by telemedicine. The patient expressed understanding and agreed to proceed.  Patient Medicare AWV questionnaire was completed by the patient on 12/03/2022; I have confirmed that all information answered by patient is correct and no changes since this date.  Review of Systems    Nutrition Risk Assessment:  Has the patient had any N/V/D within the last 2 months?  No  Does the patient have any non-healing wounds?  No  Has the patient had any unintentional weight loss or weight gain?  No   Diabetes:  Is the patient diabetic?  Yes  If diabetic, was a CBG obtained today?  No  Did the patient bring in their glucometer from home?  No  How often do you monitor your CBG's? Daily .   Financial Strains and Diabetes Management:  Are you having any financial strains with the device, your supplies or your medication? No .  Does the patient want to be seen by Chronic Care Management for management of their diabetes?  No  Would the patient like to be referred to a Nutritionist or for Diabetic Management?  No   Diabetic Exams:  Diabetic Eye Exam: Completed 07/2022 Diabetic Foot Exam: Overdue, Pt has been advised about the importance in completing this exam. Pt is scheduled for diabetic foot exam on next office visit .  Cardiac Risk Factors include: advanced age (>84men, >4 women);male gender;dyslipidemia;diabetes mellitus;hypertension;smoking/ tobacco exposure     Objective:    Today's Vitals   12/03/22 1347  Weight: 211 lb (95.7 kg)  Height: 5\' 8"  (1.727 m)   Body  mass index is 32.08 kg/m.     12/03/2022    1:53 PM 08/22/2022    6:18 AM 11/29/2021    9:55 AM 11/28/2020   11:31 AM 07/21/2019    9:39 AM 07/18/2018   10:39 AM 06/24/2018    9:47 AM  Advanced Directives  Does Patient Have a Medical Advance Directive? Yes Yes Yes Yes Yes No No  Type of Estate agent of [place];Living will Healthcare Power of [place];Living will Healthcare Power of [place];Living will Healthcare Power of [place];Living will Living will;Healthcare Power of Attorney    Does patient want to make changes to medical advance directive?     No - Patient declined    Copy of Healthcare Power of Attorney in Chart? No - copy requested No - copy requested No - copy requested No - copy requested     Would patient like information on creating a medical advance directive?      Yes (MAU/Ambulatory/Procedural Areas - Information given)     Current Medications (verified) Outpatient Encounter Medications as of 12/03/2022  Medication Sig   albuterol (PROAIR HFA) 108 (90 Base) MCG/ACT inhaler Inhale 2 puffs into the lungs every 6 (six) hours.   albuterol (PROVENTIL) (2.5 MG/3ML) 0.083% nebulizer solution Take 3 mLs (2.5 mg total) by nebulization every 4 (four) hours as needed for wheezing or shortness of breath.   Ascorbic Acid (VITAMIN C) 500 MG CAPS Take 500 mg by mouth daily.   aspirin EC 81 MG tablet Take 81 mg  by mouth daily. Swallow whole.   atorvastatin (LIPITOR) 40 MG tablet TAKE 1 TABLET BY MOUTH DAILY FOR CHOLESTEROL   bisoprolol (ZEBETA) 5 MG tablet Take 0.5 tablets (2.5 mg total) by mouth daily.   Budeson-Glycopyrrol-Formoterol (BREZTRI AEROSPHERE) 160-9-4.8 MCG/ACT AERO Inhale 2 puffs into the lungs 2 (two) times daily.   Cholecalciferol (VITAMIN D) 125 MCG (5000 UT) CAPS Take 5,000 Units by mouth daily.   cyanocobalamin (VITAMIN B12) 1000 MCG tablet Take 1,000 mcg by mouth daily.   dapagliflozin propanediol (FARXIGA) 10 MG TABS tablet Take 1 tablet (10 mg  total) by mouth daily before breakfast.   diphenhydramine-acetaminophen (TYLENOL PM) 25-500 MG TABS tablet Take 1 tablet by mouth at bedtime.   dupilumab (DUPIXENT) 300 MG/2ML prefilled syringe Inject 300 mg into the skin every 14 (fourteen) days. Starting at day 15 for maintenance.   famotidine (PEPCID) 20 MG tablet Take 20 tablets by mouth at bedtime.   folic acid (FOLVITE) 1 MG tablet Take 1 tablet (1 mg total) by mouth daily. (Patient taking differently: Take 1 mg by mouth daily. 400 mcg)   glucose blood (ONETOUCH VERIO) test strip CHECK BLOOD SUGAR UP TO 4 TIMES DAILY DX E11.9   Krill Oil 500 MG CAPS Take 500 mg by mouth daily.   levothyroxine (SYNTHROID) 50 MCG tablet Take 1 tablet (50 mcg total) by mouth daily before breakfast.   metFORMIN (GLUCOPHAGE-XR) 750 MG 24 hr tablet TAKE 2 TABLETS BY MOUTH DAILY  WITH BREAKFAST   NIFEdipine (ADALAT CC) 60 MG 24 hr tablet Take 1 tablet (60 mg total) by mouth 2 (two) times daily. (Patient taking differently: Take 120 mg by mouth daily.)   olmesartan-hydrochlorothiazide (BENICAR HCT) 40-25 MG tablet Take 1 tablet by mouth daily.   OneTouch Delica Lancets 33G MISC Check BS up to 4 x daily Dx E11.9   tamsulosin (FLOMAX) 0.4 MG CAPS capsule Take 2 capsules (0.8 mg total) by mouth at bedtime. For urine flow and prostate   Facility-Administered Encounter Medications as of 12/03/2022  Medication   sodium chloride flush (NS) 0.9 % injection 3 mL    Allergies (verified) Patient has no known allergies.   History: Past Medical History:  Diagnosis Date   Anxiety    Arthritis    "neck; lower back; right hip; right knee" (10/18/2014)   Asthma    Atypical nevus 03/27/2011   Mid Back-Severe (Exc)   Atypical nevus 01/20/2007   Right Flank[facility]ed [facility])   Colon polyps    COPD (chronic obstructive pulmonary disease) (HCC)    Diabetes mellitus without complication (HCC)    High blood pressure    High cholesterol    Hilar adenopathy     Hypothyroidism    Multiple rib fractures 08/2016   left side   Nodular basal cell carcinoma (BCC) 02/18/2020   Left Temporal Scalp   Nodular basal cell carcinoma (BCC) 07/04/2021   Left Zygomatic Area   Nodular basal cell carcinoma (BCC) 07/04/2021   Left Scaphoid Fossa (curet and 5FU)   SCCA (squamous cell carcinoma) of skin 02/18/2020   Left Nasal Sidewall (well diff)   SCCA (squamous cell carcinoma) of skin 02/18/2020   Right Temple (well diff)   SCCA (squamous cell carcinoma) of skin 02/18/2020   Left Temple (well diff)   Squamous cell carcinoma in situ (SCCIS) 01/09/2016   Right Low Lip Med (Cx3,5FU,LN2) and Top Scalp Sup (Cx3,5FU)   Superficial nodular basal cell carcinoma (BCC) 01/09/2016   Left Sideburn(Exc)   Past  Surgical History:  Procedure Laterality Date   BACK SURGERY     epidural injections     FIXATION KYPHOPLASTY LUMBAR SPINE     "L1"   KNEE ARTHROSCOPY Right 05/14/2010   LEFT HEART CATH AND CORONARY ANGIOGRAPHY N/A 08/22/2022   Procedure: LEFT HEART CATH AND CORONARY ANGIOGRAPHY;  Surgeon: Corky Crafts, MD;  Location[facility] INVASIVE CV LAB;  Service: Cardiovascular;  Laterality: N/A;   PLEURAL EFFUSION DRAINAGE Left 08/22/2016   Procedure: DRAINAGE OF HEMOTHORAX;  Surgeon: Loreli Slot, MD;  Location: [facility] OR;  Service: Thoracic;  Laterality: Left;   RIB PLATING Left 08/22/2016   Procedure: RIB PLATING;  Surgeon: Loreli Slot, MD;  Location: [facility] OR;  Service: Thoracic;  Laterality: Left;   VIDEO ASSISTED THORACOSCOPY Left 08/22/2016   Procedure: VIDEO ASSISTED THORACOSCOPY;  Surgeon: Loreli Slot, MD;  Location: [facility] OR;  Service: Thoracic;  Laterality: Left;   WISDOM TOOTH EXTRACTION     Family History  Problem Relation Age of Onset   Emphysema Father    Lung cancer Father    Heart disease Brother    Heart failure Mother    Diabetes Mother    Social History   Socioeconomic History   Marital status: Married    Spouse name: Misty Stanley    Number of children: 1   Years of education: Not on file   Highest education level: Associate degree: occupational, Scientist, product/process development, or vocational program  Occupational History   Occupation: Patent attorney: [employer]    Comment: retired  Tobacco Use   Smoking status: Former    Current packs/day: 0.00    Average packs/day: 1.5 packs/day for 45.0 years (67.5 ttl pk-yrs)    Types: Cigarettes    Start date: 11/11/1972    Quit date: 11/11/2017    Years since quitting: 5.0    Passive exposure: Never   Smokeless tobacco: Never   Tobacco comments:        Vaping Use   Vaping status: Never Used  Substance and Sexual Activity   Alcohol use: Yes    Alcohol/week: 18.0 standard drinks of alcohol    Types: 18 Cans of beer per week    Comment: 11/28/20 - trying to cut back/quit - down to 2-3 per day   Drug use: No   Sexual activity: Not Currently  Other Topics Concern   Not on file  Social History Narrative   Disabled      Lives with wife      [employer]      Asbestosis, COPD - chronic SOB      Alcoholic - trying to quit; He did quit smoking 2019   Social Determinants of Health   Financial Resource Strain: Low Risk  (12/03/2022)   Overall Financial Resource Strain (CARDIA)    Difficulty of Paying Living Expenses: Not hard at all  Food Insecurity: No Food Insecurity (12/03/2022)   Hunger Vital Sign    Worried About Running Out of Food in the Last Year: Never true    Ran Out of Food in the Last Year: Never true  Transportation Needs: No Transportation Needs (12/03/2022)   PRAPARE - Administrator, Civil Service (Medical): No    Lack of Transportation (Non-Medical): No  Physical Activity: Inactive (12/03/2022)   Exercise Vital Sign    Days of Exercise per Week: 0 days    Minutes of Exercise per Session: 0 min  Stress: No Stress Concern Present (12/03/2022)  [employer] of Occupational Health - Occupational Stress Questionnaire    Feeling of Stress : Not at all   Social Connections: Moderately Isolated (12/03/2022)   Social Connection and Isolation Panel [NHANES]    Frequency of Communication with Friends and Family: More than three times a week    Frequency of Social Gatherings with Friends and Family: More than three times a week    Attends Religious Services: Never    Database administrator or Organizations: No    Attends Engineer, structural: Never    Marital Status: Married    Tobacco Counseling Counseling given: Not Answered Tobacco comments:      Clinical Intake:  Pre-visit preparation completed: Yes  Pain : No/denies pain     Nutritional Risks: None Diabetes: Yes CBG done?: No Did pt. bring in CBG monitor from home?: No  How often do you need to have someone help you when you read instructions, pamphlets, or other written materials from your doctor or pharmacy?: 1 - Never  Interpreter Needed?: No  Information entered by :: Renie Ora, LPN   Activities of Daily Living    12/03/2022    1:53 PM  In your present state of health, do you have any difficulty performing the following activities:  Hearing? 0  Vision? 0  Difficulty concentrating or making decisions? 0  Walking or climbing stairs? 0  Dressing or bathing? 0  Doing errands, shopping? 0  Preparing Food and eating ? N  Using the Toilet? N  In the past six months, have you accidently leaked urine? N  Do you have problems with loss of bowel control? N  Managing your Medications? N  Managing your Finances? N  Housekeeping or managing your Housekeeping? N    Patient Care Team: Mechele Claude, MD as PCP - General (Family Medicine) Mallipeddi, Orion Modest, MD as PCP - Cardiology (Cardiology) Nyoka Cowden, MD as Consulting Physician (Pulmonary Disease) Janalyn Harder, MD (Inactive) as Consulting Physician (Dermatology) [facility] Danella Maiers, RPH as Pharmacist (Family Medicine) Durene Romans, MD as Consulting Physician  (Orthopedic Surgery) Sharlene Dory, NP as Nurse Practitioner (Cardiology)  Indicate any recent Medical Services you may have received from other than [facility] providers in the past year (date may be approximate).     Assessment:   This is a routine wellness examination for [place].  Hearing/Vision screen Vision Screening - Comments:: Wears rx glasses - up to date with routine eye exams with  Dr.groat   Dietary issues and exercise activities discussed:     Goals Addressed             This Visit's Progress    DIET - INCREASE WATER INTAKE         Depression Screen    12/03/2022    1:51 PM 09/06/2022   11:19 AM 08/06/2022    3:50 PM 06/13/2022    9:11 AM 05/03/2022   10:31 AM 12/07/2021   10:06 AM 11/29/2021    9:54 AM  PHQ 2/9 Scores  PHQ - 2 Score 0 0 0 0 0 0 1  PHQ- 9 Score       3    Fall Risk    12/03/2022    1:49 PM 09/06/2022   11:19 AM 08/06/2022    3:50 PM 06/13/2022    9:11 AM 05/03/2022   10:30 AM  Fall Risk   Falls in the past year? 0 1 1 1 1   Number falls in past  yr: 0 0 0 0 0  Injury with Fall? 0 1 1 1 1   Risk for fall due to : No Fall Risks History of fall(s) History of fall(s) No Fall Risks No Fall Risks  Risk for fall due to: Comment     dog knocked down  Follow up Falls prevention discussed  Falls evaluation completed Falls evaluation completed Falls evaluation completed    MEDICARE RISK AT HOME:  Medicare Risk at Home - 12/03/22 1349     Any stairs in or around the home? Yes    If so, are there any without handrails? No    Home free of loose throw rugs in walkways, pet beds, electrical cords, etc? Yes    Adequate lighting in your home to reduce risk of falls? Yes    Life alert? No    Use of a cane, walker or w/c? No    Grab bars in the bathroom? No    Shower chair or bench in shower? No    Elevated toilet seat or a handicapped toilet? No             TIMED UP AND GO:  Was the test performed?  No    Cognitive Function:    07/18/2018    10:56 AM  MMSE - Mini Mental State Exam  Orientation to time 5  Orientation to Place 5  Registration 3  Attention/ Calculation 5  Recall 3  Language- name 2 objects 2  Language- repeat 1  Language- follow 3 step command 3  Language- read & follow direction 1  Write a sentence 1  Copy design 0  Total score 29        12/03/2022    1:53 PM 11/29/2021    9:57 AM 07/21/2019    9:42 AM  6CIT Screen  What Year? 0 points 0 points 0 points  What month? 0 points 0 points   What time? 0 points 0 points 0 points  Count back from 20 0 points 0 points 0 points  Months in reverse 0 points 0 points 0 points  Repeat phrase 0 points 0 points 0 points  Total Score 0 points 0 points     Immunizations Immunization History  Administered Date(s) Administered   Influenza Split 02/12/2012, 02/11/2013   Influenza Whole 03/12/2011   Influenza,inj,Quad PF,6+ Mos 02/21/2015, 03/07/2016, 03/06/2017, 02/20/2018, 03/10/2019, 02/02/2020, 03/01/2021, 03/09/2022   Moderna Covid-19 Vaccine Bivalent Booster 24yrs & up 03/28/2021   Moderna SARS-COV2 Booster Vaccination 04/19/2020, 10/18/2020   Moderna Sars-Covid-2 Vaccination 08/05/2019, 08/31/2019   Pneumococcal Conjugate-13 02/21/2015   Pneumococcal Polysaccharide-23 04/14/2013   Td 05/14/2002   Tdap 02/06/2013    TDAP status: Due, Education has been provided regarding the importance of this vaccine. Advised may receive this vaccine at local pharmacy or Health Dept. Aware to provide a copy of the vaccination record if obtained from local pharmacy or Health Dept. Verbalized acceptance and understanding.  Flu Vaccine status: Declined, Education has been provided regarding the importance of this vaccine but patient still declined. Advised may receive this vaccine at local pharmacy or Health Dept. Aware to provide a copy of the vaccination record if obtained from local pharmacy or Health Dept. Verbalized acceptance and understanding.  Pneumococcal vaccine  status: Declined,  Education has been provided regarding the importance of this vaccine but patient still declined. Advised may receive this vaccine at local pharmacy or Health Dept. Aware to provide a copy of the vaccination record if obtained from local pharmacy or  Health Dept. Verbalized acceptance and understanding.   Covid-19 vaccine status: Completed vaccines  Qualifies for Shingles Vaccine? Yes   Zostavax completed No   Shingrix Completed?: No.    Education has been provided regarding the importance of this vaccine. Patient has been advised to call insurance company to determine out of pocket expense if they have not yet received this vaccine. Advised may also receive vaccine at local pharmacy or Health Dept. Verbalized acceptance and understanding.  Screening Tests Health Maintenance  Topic Date Due   FOOT EXAM  Never done   OPHTHALMOLOGY EXAM  Never done   Zoster Vaccines- Shingrix (1 of 2) Never done   Colonoscopy  02/23/2016   COVID-19 Vaccine (4 - 2023-24 season) 01/12/2022   Lung Cancer Screening  01/02/2023   HEMOGLOBIN A1C  12/12/2022   INFLUENZA VACCINE  12/13/2022   DTaP/Tdap/Td (3 - Td or Tdap) 02/07/2023   Diabetic kidney evaluation - Urine ACR  06/14/2023   Diabetic kidney evaluation - eGFR measurement  10/01/2023   Medicare Annual Wellness (AWV)  12/03/2023   Hepatitis C Screening  Completed   HIV Screening  Completed   HPV VACCINES  Aged Out    Health Maintenance  Health Maintenance Due  Topic Date Due   FOOT EXAM  Never done   OPHTHALMOLOGY EXAM  Never done   Zoster Vaccines- Shingrix (1 of 2) Never done   Colonoscopy  02/23/2016   COVID-19 Vaccine (4 - 2023-24 season) 01/12/2022   Lung Cancer Screening  01/02/2023    Colorectal cancer screening: Referral to GI placed 12/03/2022. Pt aware the office will call re: appt.  Lung Cancer Screening: (Low Dose CT Chest recommended if Age 46-80 years, 20 pack-year currently smoking OR have quit w/in 15years.)  does not qualify.   Lung Cancer Screening Referral: completed 01/01/2022  Additional Screening:  Hepatitis C Screening: does not qualify; Completed 12/05/2015  Vision Screening: Recommended annual ophthalmology exams for early detection of glaucoma and other disorders of the eye. Is the patient up to date with their annual eye exam?  Yes  Who is the provider or what is the name of the office in which the patient attends annual eye exams? Dr.Groat  If pt is not established with a provider, would they like to be referred to a provider to establish care? No .   Dental Screening: Recommended annual dental exams for proper oral hygiene    Community Resource Referral / Chronic Care Management: CRR required this visit?  No   CCM required this visit?  No     Plan:     I have personally reviewed and noted the following in the patient's chart:   Medical and social history Use of alcohol, tobacco or illicit drugs  Current medications and supplements including opioid prescriptions. Patient is not currently taking opioid prescriptions. Functional ability and status Nutritional status Physical activity Advanced directives List of other physicians Hospitalizations, surgeries, and ER visits in previous 12 months Vitals Screenings to include cognitive, depression, and falls Referrals and appointments  In addition, I have reviewed and discussed with patient certain preventive protocols, quality metrics, and best practice recommendations. A written personalized care plan for preventive services as well as general preventive health recommendations were provided to patient.   Per patient no change in vitals since last visit, unable to obtain new vitals due to telehealth visit   Lorrene Reid, LPN   [id number]/[id number]   After Visit Summary: (MyChart) Due to this being a telephonic visit,  the after visit summary with patients personalized plan was offered to patient via MyChart   Nurse Notes: none

## 2022-12-12 ENCOUNTER — Other Ambulatory Visit: Payer: Self-pay | Admitting: Family Medicine

## 2022-12-12 ENCOUNTER — Ambulatory Visit (INDEPENDENT_AMBULATORY_CARE_PROVIDER_SITE_OTHER): Payer: Medicare Other | Admitting: Family Medicine

## 2022-12-12 ENCOUNTER — Encounter: Payer: Self-pay | Admitting: Family Medicine

## 2022-12-12 VITALS — BP 125/65 | HR 80 | Temp 98.1°F | Ht 68.0 in | Wt 209.8 lb

## 2022-12-12 DIAGNOSIS — J449 Chronic obstructive pulmonary disease, unspecified: Secondary | ICD-10-CM

## 2022-12-12 DIAGNOSIS — E039 Hypothyroidism, unspecified: Secondary | ICD-10-CM

## 2022-12-12 DIAGNOSIS — I10 Essential (primary) hypertension: Secondary | ICD-10-CM

## 2022-12-12 DIAGNOSIS — E782 Mixed hyperlipidemia: Secondary | ICD-10-CM

## 2022-12-12 DIAGNOSIS — Z7984 Long term (current) use of oral hypoglycemic drugs: Secondary | ICD-10-CM

## 2022-12-12 DIAGNOSIS — E119 Type 2 diabetes mellitus without complications: Secondary | ICD-10-CM

## 2022-12-12 LAB — BAYER DCA HB A1C WAIVED: HB A1C (BAYER DCA - WAIVED): 6.1 % — ABNORMAL HIGH (ref 4.8–5.6)

## 2022-12-12 MED ORDER — ONETOUCH VERIO VI STRP: ORAL_STRIP | 3 refills | Status: DC

## 2022-12-12 MED ORDER — ONETOUCH DELICA LANCETS 33G MISC: 3 refills | Status: DC

## 2022-12-12 MED ORDER — BREZTRI AEROSPHERE 160-9-4.8 MCG/ACT IN AERO: 2.0000 | INHALATION_SPRAY | Freq: Two times a day (BID) | RESPIRATORY_TRACT | Status: DC

## 2022-12-12 NOTE — Progress Notes (Addendum)
Subjective:  Patient ID: Frank Rosales, male    DOB: [birth year]  Age: 67 y.o. MRN: [id number]  CC: Medical Management of Chronic Issues   HPI Frank Rosales presents forFollow-up of diabetes. Patient checks blood sugar at home.   80 fasting and 160  postprandial Patient denies symptoms such as polyuria, polydipsia, excessive hunger, nausea No significant hypoglycemic spells noted. Medications reviewed. Pt reports taking them regularly without complication/adverse reaction being reported today.  0 Presents for  follow-up of hypertension. Patient has no history of headache chest pain or recent cough. Patient also denies symptoms of TIA such as focal numbness or weakness. Patient denies side effects from medication. States taking it regularly.    History Frank Rosales has a past medical history of Anxiety, Arthritis, Asthma, Atypical nevus (03/27/2011), Atypical nevus (01/20/2007), Colon polyps, COPD (chronic obstructive pulmonary disease) (HCC), Diabetes mellitus without complication (HCC), High blood pressure, High cholesterol, Hilar adenopathy, Hypothyroidism, Multiple rib fractures (08/2016), Nodular basal cell carcinoma (BCC) (02/18/2020), Nodular basal cell carcinoma (BCC) (07/04/2021), Nodular basal cell carcinoma (BCC) (07/04/2021), SCCA (squamous cell carcinoma) of skin (02/18/2020), SCCA (squamous cell carcinoma) of skin (02/18/2020), SCCA (squamous cell carcinoma) of skin (02/18/2020), Squamous cell carcinoma in situ (SCCIS) (01/09/2016), and Superficial nodular basal cell carcinoma (BCC) (01/09/2016).   He has a past surgical history that includes Knee arthroscopy (Right, 05/14/2010); Wisdom tooth extraction; epidural injections; Fixation kyphoplasty lumbar spine; Back surgery; Video assisted thoracoscopy (Left, 08/22/2016); Pleural effusion drainage (Left, 08/22/2016); Rib plating (Left, 08/22/2016); and LEFT HEART CATH AND CORONARY ANGIOGRAPHY (N/A, 08/22/2022).   His family history includes  Diabetes in his mother; Emphysema in his father; Heart disease in his brother; Heart failure in his mother; Lung cancer in his father.He reports that he quit smoking about 5 years ago. His smoking use included cigarettes. He started smoking about 50 years ago. He has a 67.5 pack-year smoking history. He has never been exposed to tobacco smoke. He has never used smokeless tobacco. He reports current alcohol use of about 18.0 standard drinks of alcohol per week. He reports that he does not use drugs.  Current Outpatient Medications on File Prior to Visit  Medication Sig Dispense Refill   albuterol (PROAIR HFA) 108 (90 Base) MCG/ACT inhaler Inhale 2 puffs into the lungs every 6 (six) hours. 54 g 1   albuterol (PROVENTIL) (2.5 MG/3ML) 0.083% nebulizer solution Take 3 mLs (2.5 mg total) by nebulization every 4 (four) hours as needed for wheezing or shortness of breath. 240 mL 5   Ascorbic Acid (VITAMIN C) 500 MG CAPS Take 500 mg by mouth daily.     aspirin EC 81 MG tablet Take 81 mg by mouth daily. Swallow whole.     atorvastatin (LIPITOR) 40 MG tablet TAKE 1 TABLET BY MOUTH DAILY FOR CHOLESTEROL 100 tablet 2   bisoprolol (ZEBETA) 5 MG tablet Take 0.5 tablets (2.5 mg total) by mouth daily. 45 tablet 1   Budeson-Glycopyrrol-Formoterol (BREZTRI AEROSPHERE) 160-9-4.8 MCG/ACT AERO Inhale 2 puffs into the lungs 2 (two) times daily. 32.1 g 11   Cholecalciferol (VITAMIN D) 125 MCG (5000 UT) CAPS Take 5,000 Units by mouth daily.     cyanocobalamin (VITAMIN B12) 1000 MCG tablet Take 1,000 mcg by mouth daily.     dapagliflozin propanediol (FARXIGA) 10 MG TABS tablet Take 1 tablet (10 mg total) by mouth daily before breakfast. 90 tablet 5   diphenhydramine-acetaminophen (TYLENOL PM) 25-500 MG TABS tablet Take 1 tablet by mouth at bedtime.     dupilumab (  DUPIXENT) 300 MG/2ML prefilled syringe Inject 300 mg into the skin every 14 (fourteen) days. Starting at day 15 for maintenance. 12 mL 3   famotidine (PEPCID) 20 MG  tablet Take 20 tablets by mouth at bedtime.     folic acid (FOLVITE) 1 MG tablet Take 1 tablet (1 mg total) by mouth daily. (Patient taking differently: Take 1 mg by mouth daily. 400 mcg) 90 tablet 3   glucose blood (ONETOUCH VERIO) test strip CHECK BLOOD SUGAR UP TO 4 TIMES DAILY DX E11.9 400 strip 3   Krill Oil 500 MG CAPS Take 500 mg by mouth daily.     levothyroxine (SYNTHROID) 50 MCG tablet Take 1 tablet (50 mcg total) by mouth daily before breakfast. 100 tablet 3   metFORMIN (GLUCOPHAGE-XR) 750 MG 24 hr tablet TAKE 2 TABLETS BY MOUTH DAILY  WITH BREAKFAST 200 tablet 0   NIFEdipine (ADALAT CC) 60 MG 24 hr tablet Take 1 tablet (60 mg total) by mouth 2 (two) times daily. (Patient taking differently: Take 120 mg by mouth daily.) 200 tablet 3   olmesartan-hydrochlorothiazide (BENICAR HCT) 40-25 MG tablet Take 1 tablet by mouth daily. 100 tablet 1   OneTouch Delica Lancets 33G MISC Check BS up to 4 x daily Dx E11.9 400 each 3   tamsulosin (FLOMAX) 0.4 MG CAPS capsule Take 2 capsules (0.8 mg total) by mouth at bedtime. For urine flow and prostate 180 capsule 3   Current Facility-Administered Medications on File Prior to Visit  Medication Dose Route Frequency Provider Last Rate Last Admin   sodium chloride flush (NS) 0.9 % injection 3 mL  3 mL Intravenous Q12H Sharlene Dory, NP        ROS Review of Systems  Constitutional:  Negative for fever.  Respiratory:  Positive for shortness of breath (at baseline).   Cardiovascular:  Negative for chest pain.  Musculoskeletal:  Negative for arthralgias.  Skin:  Negative for rash.    Objective:  BP 125/65   Pulse 80   Temp 98.1 F (36.7 C)   Ht 5\' 8"  (1.727 m)   Wt 209 lb 12.8 oz (95.2 kg)   BMI 31.90 kg/m   BP Readings from Last 3 Encounters:  12/12/22 125/65  11/20/22 128/72  09/18/22 124/72    Wt Readings from Last 3 Encounters:  12/12/22 209 lb 12.8 oz (95.2 kg)  12/03/22 211 lb (95.7 kg)  11/20/22 211 lb (95.7 kg)      Physical Exam Vitals reviewed.  Constitutional:      Appearance: He is well-developed.  HENT:     Head: Normocephalic and atraumatic.     Right Ear: External ear normal.     Left Ear: External ear normal.     Mouth/Throat:     Pharynx: No oropharyngeal exudate or posterior oropharyngeal erythema.  Eyes:     Pupils: Pupils are equal, round, and reactive to light.  Cardiovascular:     Rate and Rhythm: Normal rate and regular rhythm.     Heart sounds: No murmur heard. Pulmonary:     Effort: Pulmonary effort is normal. No respiratory distress.     Comments: Distant breath sounds  Musculoskeletal:     Cervical back: Normal range of motion and neck supple.  Neurological:     Mental Status: He is alert and oriented to person, place, and time.       Assessment & Plan:   Zev was seen today for medical management of chronic issues.  Diagnoses and all orders  for this visit:  Diabetes mellitus, new onset (HCC) -     Bayer DCA Hb A1c Waived  Mixed hyperlipidemia -     Lipid panel  Essential hypertension -     CBC with Differential/Platelet -     CMP14+EGFR  Hypothyroidism, unspecified type -     TSH + free T4  COPD GOLD III   Stable currently    I am having Nyan A. Daoud maintain his Vitamin D, cyanocobalamin, folic acid, famotidine, Vitamin C, albuterol, albuterol, Breztri Aerosphere, dapagliflozin propanediol, diphenhydramine-acetaminophen, OneTouch Delica Lancets 33G, levothyroxine, NIFEdipine, tamsulosin, aspirin EC, Krill Oil, atorvastatin, Dupixent, olmesartan-hydrochlorothiazide, bisoprolol, metFORMIN, and OneTouch Verio. We will continue to administer sodium chloride flush.  No orders of the defined types were placed in this encounter.    Follow-up: No follow-ups on file.  Mechele Claude, M.D.

## 2022-12-13 LAB — CBC WITH DIFFERENTIAL/PLATELET
Basophils Absolute: 0.1 10*3/uL (ref 0.0–0.2)
Basos: 1 %
EOS (ABSOLUTE): 0.2 10*3/uL (ref 0.0–0.4)
Eos: 2 %
Hematocrit: 48.2 % (ref 37.5–51.0)
Hemoglobin: 17 g/dL (ref 13.0–17.7)
Immature Grans (Abs): 0 10*3/uL (ref 0.0–0.1)
Immature Granulocytes: 0 %
Lymphocytes Absolute: 1.2 10*3/uL (ref 0.7–3.1)
Lymphs: 13 %
MCH: 35.7 pg — ABNORMAL HIGH (ref 26.6–33.0)
MCHC: 35.3 g/dL (ref 31.5–35.7)
MCV: 101 fL — ABNORMAL HIGH (ref 79–97)
Monocytes Absolute: 1 10*3/uL — ABNORMAL HIGH (ref 0.1–0.9)
Monocytes: 11 %
Neutrophils Absolute: 6.5 10*3/uL (ref 1.4–7.0)
Neutrophils: 73 %
Platelets: 239 10*3/uL (ref 150–450)
RBC: 4.76 x10E6/uL (ref 4.14–5.80)
RDW: 12.7 % (ref 11.6–15.4)
WBC: 8.9 10*3/uL (ref 3.4–10.8)

## 2022-12-13 LAB — LIPID PANEL
Chol/HDL Ratio: 1.9 ratio (ref 0.0–5.0)
Cholesterol, Total: 123 mg/dL (ref 100–199)
HDL: 64 mg/dL (ref >39)
LDL Chol Calc (NIH): 45 mg/dL (ref 0–99)
Triglycerides: 69 mg/dL (ref 0–149)
VLDL Cholesterol Cal: 14 mg/dL (ref 5–40)

## 2022-12-13 LAB — CMP14+EGFR
ALT: 41 IU/L (ref 0–44)
AST: 36 IU/L (ref 0–40)
Albumin: 4.9 g/dL (ref 3.9–4.9)
Alkaline Phosphatase: 90 IU/L (ref 44–121)
BUN/Creatinine Ratio: 10 (ref 10–24)
BUN: 11 mg/dL (ref 8–27)
Bilirubin Total: 0.9 mg/dL (ref 0.0–1.2)
CO2: 21 mmol/L (ref 20–29)
Calcium: 9.8 mg/dL (ref 8.6–10.2)
Chloride: 94 mmol/L — ABNORMAL LOW (ref 96–106)
Creatinine, Ser: 1.06 mg/dL (ref 0.76–1.27)
Globulin, Total: 2.8 g/dL (ref 1.5–4.5)
Glucose: 122 mg/dL — ABNORMAL HIGH (ref 70–99)
Potassium: 4.5 mmol/L (ref 3.5–5.2)
Sodium: 133 mmol/L — ABNORMAL LOW (ref 134–144)
Total Protein: 7.7 g/dL (ref 6.0–8.5)
eGFR: 78 mL/min/{1.73_m2} (ref >59)

## 2022-12-13 LAB — TSH+FREE T4
Free T4: 1.52 ng/dL (ref 0.82–1.77)
TSH: 3.12 u[IU]/mL (ref 0.450–4.500)

## 2022-12-13 MED ORDER — ONETOUCH DELICA LANCETS 33G MISC: 3 refills | Status: DC

## 2022-12-13 NOTE — Progress Notes (Signed)
Fax from [facility], script clarification on lancets changed directions to QID to match test strips.

## 2022-12-13 NOTE — Addendum Note (Signed)
Addended by: Julious Payer D on: 12/13/2022 08:54 AM   Modules accepted: Orders

## 2022-12-15 ENCOUNTER — Other Ambulatory Visit: Payer: Self-pay | Admitting: Nurse Practitioner

## 2022-12-17 NOTE — Progress Notes (Signed)
Hello Frank Rosales,  Your lab result is normal and/or stable.Some minor variations that are not significant are commonly marked abnormal, but do not represent any medical problem for you.  Best regards,  , M.D.

## 2022-12-21 ENCOUNTER — Encounter: Payer: Self-pay | Admitting: *Deleted

## 2022-12-25 ENCOUNTER — Other Ambulatory Visit: Payer: Self-pay | Admitting: Family Medicine

## 2022-12-25 DIAGNOSIS — E119 Type 2 diabetes mellitus without complications: Secondary | ICD-10-CM

## 2023-01-20 NOTE — Progress Notes (Unsigned)
Subjective:   Patient ID: Frank Rosales, male    DOB: May 22, 1955    MRN: [id number]    Brief patient profile:  39  yowm/MM "quit smoking"  11/2017  with h/o exposure to Asbestosis at [facility] ? Into the 80s  still able to walk  flat fine but steps x 3 flights sob with GOLD III copd 2013 prev eval by Dr Delford Field  / gets annual f/u in [place]    History of Present Illness  02/16/2016  f/u ov/Frank Rosales re:  GOLD III copd /  symbicort 160 2bid / twice weekly saba  Chief Complaint  Patient presents with   Follow-up    Pt states had CT Chest done 12/27/15- ordered by worker's comp for eval of previous asbestos exp. He states he had been doing well until July 2017 developed cough and congestion that lasted 2 months, but starting to improve.   sick July - September 2017 rx with abx/ steroids better since early Sept 2017 but CT was done in middle of this illness and was abnormal but was done for purpose of screening for asbestos  rec Plan A = Automatic = symbicort 160 Take 2 puffs first thing in am and then another 2 puffs about 12 hours later.  Work on inhaler technique:   Only use your albuterol(proair)  as a rescue medication  The key is to stop smoking completely before smoking completely stops you!       04/03/2021  f/u ov/[place] office/Frank Rosales re: GOLD 3 maint on breztri / dupixent  and hs pepcid  Chief Complaint  Patient presents with   Follow-up    Coughing up green mucus, chest and throat are hurting. Increased in SOB. Has taken dayquil and mucinex since Tues. 03-28-21 after taking booster. No fever. Neg. Covid test yesterday.    Dyspnea:  able to walk to shop "just barely" at baseline  Cough: worse than usual 03/28/21 when got bivalent vaccine / neg covid Ag 04/02/21 Sleeping: flat bed / 2pillows   SABA use: just using hfa at baseline, even when sick just hfa  02: none  Covid status: vax x 5  Rec Plan A = Automatic = Always=    Breztri Take 2 puffs first thing in am and then  another 2 puffs about 12 hours later.   Plan B = Backup (to supplement plan A, not to replace it) Only use your albuterol inhaler as a rescue medication  Plan C = Crisis (instead of Plan B but only if Plan B stops working) - only use your albuterol nebulizer if you first try Plan B and it fails to help > ok to use the nebulizer up to every 4 hours but if start needing it regularly call for immediate appointment For flares of coughing: Immediately start protonix 40 mg Take 30-60 min before first meal of the day until cough is better for a week and no longer need max dose of mucinex dm 1200  mg every 12 hours  Zpak  Prednisone 10 mg take  4 each am x 2 days,   2 each am x 2 days,  1 each am x 2 days and stop  Depomedrol 80 mg IM today  Please schedule a follow up visit in 6 months but call sooner if needed       09/29/2021  f/u ov/[place] office/Frank Rosales re: GOLD 3  maint on breztri/dupixent and pepcid each evening   Chief Complaint  Patient presents with   Follow-up  Cough and chest congestion have cleared up   Dyspnea:  shop and back s stopping  Cough: better  Sleeping: ok flat bed / 2 pillows  SABA use: not needing  02: none  Covid status: vax x 5  Lung cancer screening done annually in [place]  Rec My office will be contacting you by phone for referral to ENT for hoarseness > [place] 01/25/22 GERD      07/26/2022  f/u ov/[place] office/Frank Rosales re: GOLD 3  maint on breztri/ dupixent   Chief Complaint  Patient presents with   Follow-up    Surgery clearance for hip replacement   Dyspnea:  more limited now by back /hips Cough: min am mucoid  Sleeping: flat bed/ 2 pillows SABA use: none needed  Lung cancer screening:  q August  Rec No change in medications  You are cleared for back surgery from a pulmonary perspective       01/22/2023 16m f/u ov/[place] office/Frank Rosales re: *** maint on ***  No chief complaint on file.   Dyspnea:  *** Cough: *** Sleeping: ***   resp cc   SABA use: *** 02: ***  Lung cancer screening: ***   No obvious day to day or daytime variability or assoc excess/ purulent sputum or mucus plugs or hemoptysis or cp or chest tightness, subjective wheeze or overt sinus or hb symptoms.    Also denies any obvious fluctuation of symptoms with weather or environmental changes or other aggravating or alleviating factors except as outlined above   No unusual exposure hx or h/o childhood pna/ asthma or knowledge of premature birth.  Current Allergies, Complete Past Medical History, Past Surgical History, Family History, and Social History were reviewed in [employer] record.  ROS  The following are not active complaints unless bolded Hoarseness, sore throat, dysphagia, dental problems, itching, sneezing,  nasal congestion or discharge of excess mucus or purulent secretions, ear ache,   fever, chills, sweats, unintended wt loss or wt gain, classically pleuritic or exertional cp,  orthopnea pnd or arm/hand swelling  or leg swelling, presyncope, palpitations, abdominal pain, anorexia, nausea, vomiting, diarrhea  or change in bowel habits or change in bladder habits, change in stools or change in urine, dysuria, hematuria,  rash, arthralgias, visual complaints, headache, numbness, weakness or ataxia or problems with walking or coordination,  change in mood or  memory.        No outpatient medications have been marked as taking for the 01/22/23 encounter (Appointment) with Nyoka Cowden, MD.   Current Facility-Administered Medications for the 01/22/23 encounter (Appointment) with Nyoka Cowden, MD  Medication   sodium chloride flush (NS) 0.9 % injection 3 mL                   Objective:   Physical Exam  Wts  01/22/2023    ***  07/26/2022    210 04/02/2022  212 09/29/2021    212 04/03/2021  203  09/16/2020      222 03/03/2020  219  02/01/2020    217 10/05/2019    228 03/31/2019  213  01/24/2015    180 > 02/21/2015   180  > 06/16/2015  186  > 02/16/2016  205 > 09/13/2016   185 >  11/07/2016  187 >  12/21/2016  195 > 03/25/2017   200 > 06/25/2017   198  > 12/23/2017   199  > 06/13/2018  200      11/10/14 174 lb (78.926 kg)  11/08/14 176 lb 6.4 oz (80.015 kg)  10/27/14 175 lb (79.379 kg)   Vital signs reviewed  01/22/2023  - Note at rest 02 sats  ***% on ***   General appearance:    ***    Mild barr penguin pattern walking/  ***         Assessment & Plan:

## 2023-01-22 ENCOUNTER — Encounter: Payer: Self-pay | Admitting: Internal Medicine

## 2023-01-22 ENCOUNTER — Ambulatory Visit: Payer: Medicare Other | Admitting: Internal Medicine

## 2023-01-22 VITALS — BP 121/81 | HR 91 | Ht 68.0 in | Wt 213.0 lb

## 2023-01-22 DIAGNOSIS — J61 Pneumoconiosis due to asbestos and other mineral fibers: Secondary | ICD-10-CM

## 2023-01-22 DIAGNOSIS — J449 Chronic obstructive pulmonary disease, unspecified: Secondary | ICD-10-CM | POA: Diagnosis not present

## 2023-01-22 NOTE — Assessment & Plan Note (Signed)
Last exp known ? 1980s wore respirator thereafter when known exp likely  - CT chest 12/27/15 ? Pleural scarring developing in bases > f/u planned by Asbestos lawyer/ Dr Valorie Roosevelt  last study  05/2017 no change   - CT chest 01/01/22 no evidence asbestos changes  pleural or parenchymal, no PF > has f/u plans per his attorney as of 01/22/2023   He needs yearly ct chest anyway through 2034 per present guidelines but already planning f/u per attorney.  Advised if he stops doing the asbestos surveillance he should consider LDSCT yearly as above.         Each maintenance medication was reviewed in detail including emphasizing most importantly the difference between maintenance and prns and under what circumstances the prns are to be triggered using an action plan format where appropriate.  Total time for H and P, chart review, counseling, reviewing hfa device(s) and generating customized AVS unique to this office visit / same day charting = 25 min

## 2023-01-22 NOTE — Assessment & Plan Note (Addendum)
Quit smoking 2019 MM Spirometry 11/2017  03/2011 FeV1 49%    Spirometry  02/28/2012  FEV1 45%   - Last day worked = October 13 2014  - PFT's  01/24/2015  FEV1 1.59  (43 % ) ratio 54   p 22 % improvement from saba with DLCO  88 % corrects to 99 % for alv volume s symbicort  - PFTs  12/27/15 1.30 (35%) ratio 35  In midst of a flare- - PFT's  12/21/2016  FEV1 1.40 (39 % ) ratio 50  p 8 % improvement from saba p symbicort 160 x 2  prior to study with DLCO  71/69 % corrects to 85  % for alv volume - PFTS  05/16/17  FEV1  1.32( 37%)  Ratio 37  And DLCO  52% > corrects to 78%  03/25/2017  try bevespi > preferred symb 160  - 06/13/2018    try adding spiriva 2.5 x 2 each am sample  > no better so did not fill rx  - 03/31/2019   Walked RA x one lap =  approx 250 ft @ mod pace - stopped due to sob with sats of 92% at the end of the study. - 02/01/2020  After extensive coaching inhaler device,  effectiveness =  90%  - alpha one AT def screen  02/02/20   MM  Level 151  -  09/16/2020   Walked RA   Moderate pace walk total 300 feet feeling short of breath at 200 feet but sats still 98% at end typical of a pink puffer - PFT's  01/01/22   FEV1 1.46(42 % ) ratio 0.42  p 0 % improvement from saba p ? prior to study with DLCO  19/7 (61%)  and FV curve classically concave  - 07/26/2022   Walked on RA   x  2  lap(s) =  approx 300  ft  @ slow/penguin gait   stopped due to sob  with lowest 02 sats 92%      Group D (now reclassified as E) in terms of symptom/risk and laba/lama/ICS  therefore appropriate rx at this point >>>  breztri plus approp saba   Monitor sats with ex see avs for instructions unique to this ov   F/u q 3 m for now, sooner if needed

## 2023-01-22 NOTE — Patient Instructions (Signed)
See Dr Darlyn Read w/in the next 2 days to reexamine your puncture wound and consider a tetanus shot   Make sure you check your oxygen saturation at your highest level of activity(NOT after you stop)  to be sure it stays over 90% and keep track of it at least once a week, more often if breathing getting worse, and let me know if losing ground. (Collect the dots to connect the dots approach)    Call your lawyers about your follow up CT   Please schedule a follow up visit in 3 months but call sooner if needed

## 2023-01-24 ENCOUNTER — Ambulatory Visit (INDEPENDENT_AMBULATORY_CARE_PROVIDER_SITE_OTHER): Payer: Medicare Other

## 2023-01-24 DIAGNOSIS — Z23 Encounter for immunization: Secondary | ICD-10-CM

## 2023-01-30 ENCOUNTER — Telehealth: Payer: Self-pay | Admitting: Internal Medicine

## 2023-01-30 DIAGNOSIS — J61 Pneumoconiosis due to asbestos and other mineral fibers: Secondary | ICD-10-CM

## 2023-01-30 DIAGNOSIS — J449 Chronic obstructive pulmonary disease, unspecified: Secondary | ICD-10-CM

## 2023-01-30 NOTE — Telephone Encounter (Signed)
Pt needs referral to get LCS

## 2023-01-31 DIAGNOSIS — L570 Actinic keratosis: Secondary | ICD-10-CM | POA: Diagnosis not present

## 2023-01-31 DIAGNOSIS — L578 Other skin changes due to chronic exposure to nonionizing radiation: Secondary | ICD-10-CM | POA: Diagnosis not present

## 2023-01-31 DIAGNOSIS — C44319 Basal cell carcinoma of skin of other parts of face: Secondary | ICD-10-CM | POA: Diagnosis not present

## 2023-01-31 DIAGNOSIS — D492 Neoplasm of unspecified behavior of bone, soft tissue, and skin: Secondary | ICD-10-CM | POA: Diagnosis not present

## 2023-01-31 DIAGNOSIS — Z08 Encounter for follow-up examination after completed treatment for malignant neoplasm: Secondary | ICD-10-CM | POA: Diagnosis not present

## 2023-01-31 DIAGNOSIS — Z85828 Personal history of other malignant neoplasm of skin: Secondary | ICD-10-CM | POA: Diagnosis not present

## 2023-02-01 NOTE — Telephone Encounter (Signed)
Referral has been placed for LCS.

## 2023-02-04 DIAGNOSIS — L821 Other seborrheic keratosis: Secondary | ICD-10-CM | POA: Diagnosis not present

## 2023-02-04 DIAGNOSIS — Z7189 Other specified counseling: Secondary | ICD-10-CM | POA: Diagnosis not present

## 2023-02-04 DIAGNOSIS — C44311 Basal cell carcinoma of skin of nose: Secondary | ICD-10-CM | POA: Diagnosis not present

## 2023-02-04 DIAGNOSIS — L814 Other melanin hyperpigmentation: Secondary | ICD-10-CM | POA: Diagnosis not present

## 2023-02-04 DIAGNOSIS — D485 Neoplasm of uncertain behavior of skin: Secondary | ICD-10-CM | POA: Diagnosis not present

## 2023-02-04 DIAGNOSIS — Z85828 Personal history of other malignant neoplasm of skin: Secondary | ICD-10-CM | POA: Diagnosis not present

## 2023-02-04 DIAGNOSIS — D492 Neoplasm of unspecified behavior of bone, soft tissue, and skin: Secondary | ICD-10-CM | POA: Diagnosis not present

## 2023-02-04 DIAGNOSIS — D225 Melanocytic nevi of trunk: Secondary | ICD-10-CM | POA: Diagnosis not present

## 2023-02-04 DIAGNOSIS — C44529 Squamous cell carcinoma of skin of other part of trunk: Secondary | ICD-10-CM | POA: Diagnosis not present

## 2023-02-04 DIAGNOSIS — D1801 Hemangioma of skin and subcutaneous tissue: Secondary | ICD-10-CM | POA: Diagnosis not present

## 2023-02-04 DIAGNOSIS — L570 Actinic keratosis: Secondary | ICD-10-CM | POA: Diagnosis not present

## 2023-02-11 ENCOUNTER — Telehealth: Payer: Self-pay | Admitting: Family Medicine

## 2023-02-11 NOTE — Telephone Encounter (Signed)
Verbal ok given to change manufacturer's on levothyroxine

## 2023-02-27 DIAGNOSIS — D045 Carcinoma in situ of skin of trunk: Secondary | ICD-10-CM | POA: Diagnosis not present

## 2023-03-01 ENCOUNTER — Telehealth: Payer: Self-pay | Admitting: Gastroenterology

## 2023-03-01 NOTE — Telephone Encounter (Signed)
Good morning Dr. Meridee Score,   Supervising Provider 10/18 AM  We received a call from this patient wishing to schedule a colonoscopy. Patient last had colonoscopy in 04/2016 with Dr. Gwinda Passe at [facility]. Patient states he would like to transfer due to Dr. Gwinda Passe being retired. His previous records were obtained and in Epic for you to review. Would you please advise on scheduling?  Thank you.

## 2023-03-04 NOTE — Telephone Encounter (Signed)
Happy to take over this patient's care. I would like to review the actual Colonoscopy report from 2017 however, so please obtain. Next available procedure in the [facility] if no Anesthesia issues is reasonable. Thanks. GM

## 2023-03-05 ENCOUNTER — Other Ambulatory Visit: Payer: Self-pay | Admitting: Family Medicine

## 2023-03-05 DIAGNOSIS — E119 Type 2 diabetes mellitus without complications: Secondary | ICD-10-CM

## 2023-03-06 ENCOUNTER — Encounter: Payer: Self-pay | Admitting: Gastroenterology

## 2023-03-06 DIAGNOSIS — C44319 Basal cell carcinoma of skin of other parts of face: Secondary | ICD-10-CM | POA: Diagnosis not present

## 2023-03-06 NOTE — Telephone Encounter (Signed)
Patient has ?'s on the patient assistance re enrollment. Please call.

## 2023-03-11 ENCOUNTER — Telehealth: Payer: Self-pay

## 2023-03-11 DIAGNOSIS — E119 Type 2 diabetes mellitus without complications: Secondary | ICD-10-CM

## 2023-03-11 DIAGNOSIS — J449 Chronic obstructive pulmonary disease, unspecified: Secondary | ICD-10-CM

## 2023-03-11 NOTE — Telephone Encounter (Signed)
Spoke with patient regarding the conditional approval letter he received. He had a family member attempt to do this online on the phone but was unsuccessful. Pt agreed to have me complete his renewal on his behalf for a quicker turnaround.

## 2023-03-12 NOTE — Telephone Encounter (Signed)
Submitted re-enrollment application for FARXIGA & BREZTRI to [place] for patient assistance.   Phone: [phone].  Could new RX's for both medications be escribed to [facility] pharmacy? Written for 90 day supplies with refills.

## 2023-03-13 MED ORDER — BREZTRI AEROSPHERE 160-9-4.8 MCG/ACT IN AERO
2.0000 | INHALATION_SPRAY | Freq: Two times a day (BID) | RESPIRATORY_TRACT | 5 refills | Status: DC
Start: 2023-03-13 — End: 2023-09-15

## 2023-03-13 MED ORDER — DAPAGLIFLOZIN PROPANEDIOL 10 MG PO TABS
10.0000 mg | ORAL_TABLET | Freq: Every day | ORAL | 5 refills | Status: DC
Start: 2023-03-13 — End: 2023-09-15

## 2023-03-13 NOTE — Telephone Encounter (Signed)
Refills sent No further action

## 2023-03-15 ENCOUNTER — Other Ambulatory Visit: Payer: Self-pay | Admitting: Family Medicine

## 2023-03-18 ENCOUNTER — Ambulatory Visit: Payer: Medicare Other | Admitting: Family Medicine

## 2023-03-21 ENCOUNTER — Encounter: Payer: Self-pay | Admitting: Family Medicine

## 2023-03-21 ENCOUNTER — Ambulatory Visit: Payer: Medicare Other | Admitting: Family Medicine

## 2023-03-21 VITALS — BP 127/72 | HR 82 | Temp 97.1°F | Ht 68.0 in | Wt 210.8 lb

## 2023-03-21 DIAGNOSIS — E039 Hypothyroidism, unspecified: Secondary | ICD-10-CM | POA: Diagnosis not present

## 2023-03-21 DIAGNOSIS — Z23 Encounter for immunization: Secondary | ICD-10-CM

## 2023-03-21 DIAGNOSIS — I10 Essential (primary) hypertension: Secondary | ICD-10-CM | POA: Diagnosis not present

## 2023-03-21 DIAGNOSIS — J449 Chronic obstructive pulmonary disease, unspecified: Secondary | ICD-10-CM

## 2023-03-21 DIAGNOSIS — E782 Mixed hyperlipidemia: Secondary | ICD-10-CM

## 2023-03-21 DIAGNOSIS — E119 Type 2 diabetes mellitus without complications: Secondary | ICD-10-CM | POA: Diagnosis not present

## 2023-03-21 DIAGNOSIS — Z7984 Long term (current) use of oral hypoglycemic drugs: Secondary | ICD-10-CM

## 2023-03-21 LAB — BAYER DCA HB A1C WAIVED: HB A1C (BAYER DCA - WAIVED): 5.9 % — ABNORMAL HIGH (ref 4.8–5.6)

## 2023-03-21 MED ORDER — LEVOTHYROXINE SODIUM 50 MCG PO TABS
50.0000 ug | ORAL_TABLET | Freq: Every day | ORAL | 3 refills | Status: DC
Start: 2023-03-21 — End: 2023-03-21

## 2023-03-21 MED ORDER — METFORMIN HCL ER 750 MG PO TB24: 1500.0000 mg | ORAL_TABLET | Freq: Every day | ORAL | 3 refills | Status: DC

## 2023-03-21 MED ORDER — OLMESARTAN MEDOXOMIL-HCTZ 40-25 MG PO TABS: 1.0000 | ORAL_TABLET | Freq: Every day | ORAL | 3 refills | Status: DC

## 2023-03-21 MED ORDER — LEVOTHYROXINE SODIUM 50 MCG PO TABS: 50.0000 ug | ORAL_TABLET | Freq: Every day | ORAL | 3 refills | Status: DC

## 2023-03-21 NOTE — Progress Notes (Signed)
Subjective:  Patient ID: Frank Rosales, male    DOB: Jan 01, 1956  Age: 67 y.o. MRN: [id number]  CC: Medical Management of Chronic Issues   HPI [name] presents forFollow-up of diabetes. Patient denies symptoms such as polyuria, polydipsia, excessive hunger, nausea No significant hypoglycemic spells noted. Medications reviewed. Pt reports taking them regularly without complication/adverse reaction being reported today.    History Ural has a past medical history of Anxiety, Arthritis, Asthma, Atypical nevus (03/27/2011), Atypical nevus (01/20/2007), Colon polyps, COPD (chronic obstructive pulmonary disease) (HCC), Diabetes mellitus without complication (HCC), High blood pressure, High cholesterol, Hilar adenopathy, Hypothyroidism, Multiple rib fractures (08/2016), Nodular basal cell carcinoma (BCC) (02/18/2020), Nodular basal cell carcinoma (BCC) (07/04/2021), Nodular basal cell carcinoma (BCC) (07/04/2021), SCCA (squamous cell carcinoma) of skin (02/18/2020), SCCA (squamous cell carcinoma) of skin (02/18/2020), SCCA (squamous cell carcinoma) of skin (02/18/2020), Squamous cell carcinoma in situ (SCCIS) (01/09/2016), and Superficial nodular basal cell carcinoma (BCC) (01/09/2016).   He has a past surgical history that includes Knee arthroscopy (Right, 05/14/2010); Wisdom tooth extraction; epidural injections; Fixation kyphoplasty lumbar spine; Back surgery; Video assisted thoracoscopy (Left, 08/22/2016); Pleural effusion drainage (Left, 08/22/2016); Rib plating (Left, 08/22/2016); and LEFT HEART CATH AND CORONARY ANGIOGRAPHY (N/A, 08/22/2022).   His family history includes Diabetes in his mother; Emphysema in his father; Heart disease in his brother; Heart failure in his mother; Lung cancer in his father.He reports that he quit smoking about 5 years ago. His smoking use included cigarettes. He started smoking about 50 years ago. He has a 67.5 pack-year smoking history. He has never been  exposed to tobacco smoke. He has never used smokeless tobacco. He reports current alcohol use of about 18.0 standard drinks of alcohol per week. He reports that he does not use drugs.  Current Outpatient Medications on File Prior to Visit  Medication Sig Dispense Refill   albuterol (PROAIR HFA) 108 (90 Base) MCG/ACT inhaler Inhale 2 puffs into the lungs every 6 (six) hours. 54 g 1   albuterol (PROVENTIL) (2.5 MG/3ML) 0.083% nebulizer solution Take 3 mLs (2.5 mg total) by nebulization every 4 (four) hours as needed for wheezing or shortness of breath. 240 mL 5   Ascorbic Acid (VITAMIN C) 500 MG CAPS Take 500 mg by mouth daily.     aspirin EC 81 MG tablet Take 81 mg by mouth daily. Swallow whole.     atorvastatin (LIPITOR) 40 MG tablet TAKE 1 TABLET BY MOUTH DAILY FOR CHOLESTEROL 100 tablet 2   bisoprolol (ZEBETA) 5 MG tablet TAKE ONE-HALF TABLET BY MOUTH  DAILY 50 tablet 2   Budeson-Glycopyrrol-Formoterol (BREZTRI AEROSPHERE) 160-9-4.8 MCG/ACT AERO Inhale 2 puffs into the lungs 2 (two) times daily. 32.1 g 5   Cholecalciferol (VITAMIN D) 125 MCG (5000 UT) CAPS Take 5,000 Units by mouth daily.     cyanocobalamin (VITAMIN B12) 1000 MCG tablet Take 1,000 mcg by mouth daily.     dapagliflozin propanediol (FARXIGA) 10 MG TABS tablet Take 1 tablet (10 mg total) by mouth daily before breakfast. 90 tablet 5   diphenhydramine-acetaminophen (TYLENOL PM) 25-500 MG TABS tablet Take 1 tablet by mouth at bedtime.     dupilumab (DUPIXENT) 300 MG/2ML prefilled syringe Inject 300 mg into the skin every 14 (fourteen) days. Starting at day 15 for maintenance. 12 mL 3   famotidine (PEPCID) 20 MG tablet Take 20 tablets by mouth at bedtime.     folic acid (FOLVITE) 1 MG tablet Take 1 tablet (1 mg total) by mouth daily. (  Patient taking differently: Take 1 mg by mouth daily. 400 mcg) 90 tablet 3   glucose blood (ONETOUCH VERIO) test strip CHECK BLOOD SUGAR UP TO 4 TIMES DAILY DX E11.9 400 strip 3   Krill Oil 500 MG CAPS  Take 500 mg by mouth daily.     NIFEdipine (ADALAT CC) 60 MG 24 hr tablet Take 1 tablet (60 mg total) by mouth 2 (two) times daily. (Patient taking differently: Take 120 mg by mouth daily.) 200 tablet 3   OneTouch Delica Lancets 33G MISC CHECK BLOOD SUGAR UP TO 4 TIMES DAILY DX E11.9 400 each 3   tamsulosin (FLOMAX) 0.4 MG CAPS capsule TAKE 2 CAPSULES BY MOUTH AT  BEDTIME FOR URINE FLOW AND  PROSTATE 200 capsule 0   Current Facility-Administered Medications on File Prior to Visit  Medication Dose Route Frequency Provider Last Rate Last Admin   sodium chloride flush (NS) 0.9 % injection 3 mL  3 mL Intravenous Q12H Sharlene Dory, NP        ROS Review of Systems  Constitutional:  Negative for fever.  Respiratory:  Negative for shortness of breath.   Cardiovascular:  Negative for chest pain.  Musculoskeletal:  Negative for arthralgias.  Skin:  Negative for rash.    Objective:  BP 127/72   Pulse 82   Temp (!) 97.1 F (36.2 C)   Ht 5\' 8"  (1.727 m)   Wt 210 lb 12.8 oz (95.6 kg)   SpO2 91%   BMI 32.05 kg/m   BP Readings from Last 3 Encounters:  03/21/23 127/72  01/22/23 121/81  12/12/22 125/65    Wt Readings from Last 3 Encounters:  03/21/23 210 lb 12.8 oz (95.6 kg)  01/22/23 213 lb (96.6 kg)  12/12/22 209 lb 12.8 oz (95.2 kg)     Physical Exam Vitals reviewed.  Constitutional:      Appearance: He is well-developed.  HENT:     Head: Normocephalic and atraumatic.     Right Ear: External ear normal.     Left Ear: External ear normal.     Mouth/Throat:     Pharynx: No oropharyngeal exudate or posterior oropharyngeal erythema.  Eyes:     Pupils: Pupils are equal, round, and reactive to light.  Cardiovascular:     Rate and Rhythm: Normal rate and regular rhythm.     Heart sounds: No murmur heard. Pulmonary:     Effort: Pulmonary effort is normal. No respiratory distress.     Comments: Breath sounds distant. Decreased exp. Phase  Musculoskeletal:     Cervical back:  Normal range of motion and neck supple.  Neurological:     Mental Status: He is alert and oriented to person, place, and time.       Assessment & Plan:   Jesus was seen today for medical management of chronic issues.  Diagnoses and all orders for this visit:  Controlled type 2 diabetes mellitus without complication, without long-term current use of insulin (HCC) -     Bayer DCA Hb A1c Waived -     Discontinue: metFORMIN (GLUCOPHAGE-XR) 750 MG 24 hr tablet; Take 2 tablets (1,500 mg total) by mouth daily with breakfast. -     metFORMIN (GLUCOPHAGE-XR) 750 MG 24 hr tablet; Take 2 tablets (1,500 mg total) by mouth daily with breakfast.  Mixed hyperlipidemia -     Lipid panel  Essential hypertension -     CBC with Differential/Platelet -     CMP14+EGFR -     Discontinue:  olmesartan-hydrochlorothiazide (BENICAR HCT) 40-25 MG tablet; Take 1 tablet by mouth daily. -     olmesartan-hydrochlorothiazide (BENICAR HCT) 40-25 MG tablet; Take 1 tablet by mouth daily.  Hypothyroidism, unspecified type -     TSH + free T4 -     Discontinue: levothyroxine (SYNTHROID) 50 MCG tablet; Take 1 tablet (50 mcg total) by mouth daily before breakfast. -     levothyroxine (SYNTHROID) 50 MCG tablet; Take 1 tablet (50 mcg total) by mouth daily before breakfast.  Need for influenza vaccination -     Flu vaccine trivalent PF, 6mos and older(Flulaval,Afluria,Fluarix,Fluzone)  COPD GOLD III       I am having Bocephus A. Scurlock maintain his Vitamin D, cyanocobalamin, folic acid, famotidine, Vitamin C, albuterol, albuterol, diphenhydramine-acetaminophen, NIFEdipine, aspirin EC, Krill Oil, atorvastatin, Dupixent, OneTouch Verio, OneTouch Delica Lancets 33G, bisoprolol, dapagliflozin propanediol, Breztri Aerosphere, tamsulosin, levothyroxine, metFORMIN, and olmesartan-hydrochlorothiazide. We will continue to administer sodium chloride flush.  Meds ordered this encounter  Medications   DISCONTD: levothyroxine  (SYNTHROID) 50 MCG tablet    Sig: Take 1 tablet (50 mcg total) by mouth daily before breakfast.    Dispense:  100 tablet    Refill:  3    Requesting 1 year supply   DISCONTD: metFORMIN (GLUCOPHAGE-XR) 750 MG 24 hr tablet    Sig: Take 2 tablets (1,500 mg total) by mouth daily with breakfast.    Dispense:  200 tablet    Refill:  3    Please send a replace/new response with 100-Day Supply if appropriate to maximize member benefit. Requesting 1 year supply.   DISCONTD: olmesartan-hydrochlorothiazide (BENICAR HCT) 40-25 MG tablet    Sig: Take 1 tablet by mouth daily.    Dispense:  100 tablet    Refill:  3    Please send a replace/new response with 100-Day Supply if appropriate to maximize member benefit.   levothyroxine (SYNTHROID) 50 MCG tablet    Sig: Take 1 tablet (50 mcg total) by mouth daily before breakfast.    Dispense:  100 tablet    Refill:  3    Requesting 1 year supply   metFORMIN (GLUCOPHAGE-XR) 750 MG 24 hr tablet    Sig: Take 2 tablets (1,500 mg total) by mouth daily with breakfast.    Dispense:  200 tablet    Refill:  3    Please send a replace/new response with 100-Day Supply if appropriate to maximize member benefit. Requesting 1 year supply.   olmesartan-hydrochlorothiazide (BENICAR HCT) 40-25 MG tablet    Sig: Take 1 tablet by mouth daily.    Dispense:  100 tablet    Refill:  3    Please send a replace/new response with 100-Day Supply if appropriate to maximize member benefit.     Follow-up: Return in about 3 months (around 06/21/2023).  Mechele Claude, M.D.

## 2023-03-22 DIAGNOSIS — C44311 Basal cell carcinoma of skin of nose: Secondary | ICD-10-CM | POA: Diagnosis not present

## 2023-03-22 LAB — CMP14+EGFR
ALT: 37 [IU]/L (ref 0–44)
AST: 35 [IU]/L (ref 0–40)
Albumin: 4.7 g/dL (ref 3.9–4.9)
Alkaline Phosphatase: 94 [IU]/L (ref 44–121)
BUN/Creatinine Ratio: 8 — ABNORMAL LOW (ref 10–24)
BUN: 8 mg/dL (ref 8–27)
Bilirubin Total: 0.8 mg/dL (ref 0.0–1.2)
CO2: 21 mmol/L (ref 20–29)
Calcium: 10 mg/dL (ref 8.6–10.2)
Chloride: 95 mmol/L — ABNORMAL LOW (ref 96–106)
Creatinine, Ser: 1.04 mg/dL (ref 0.76–1.27)
Globulin, Total: 2.9 g/dL (ref 1.5–4.5)
Glucose: 137 mg/dL — ABNORMAL HIGH (ref 70–99)
Potassium: 4.5 mmol/L (ref 3.5–5.2)
Sodium: 133 mmol/L — ABNORMAL LOW (ref 134–144)
Total Protein: 7.6 g/dL (ref 6.0–8.5)
eGFR: 80 mL/min/{1.73_m2} (ref >59)

## 2023-03-22 LAB — CBC WITH DIFFERENTIAL/PLATELET
Basophils Absolute: 0.1 10*3/uL (ref 0.0–0.2)
Basos: 1 %
EOS (ABSOLUTE): 0.1 10*3/uL (ref 0.0–0.4)
Eos: 1 %
Hematocrit: 49.8 % (ref 37.5–51.0)
Hemoglobin: 17.7 g/dL (ref 13.0–17.7)
Immature Grans (Abs): 0 10*3/uL (ref 0.0–0.1)
Immature Granulocytes: 0 %
Lymphocytes Absolute: 1.2 10*3/uL (ref 0.7–3.1)
Lymphs: 12 %
MCH: 35.8 pg — ABNORMAL HIGH (ref 26.6–33.0)
MCHC: 35.5 g/dL (ref 31.5–35.7)
MCV: 101 fL — ABNORMAL HIGH (ref 79–97)
Monocytes Absolute: 1.1 10*3/uL — ABNORMAL HIGH (ref 0.1–0.9)
Monocytes: 11 %
Neutrophils Absolute: 7.5 10*3/uL — ABNORMAL HIGH (ref 1.4–7.0)
Neutrophils: 75 %
Platelets: 258 10*3/uL (ref 150–450)
RBC: 4.95 x10E6/uL (ref 4.14–5.80)
RDW: 12.5 % (ref 11.6–15.4)
WBC: 10 10*3/uL (ref 3.4–10.8)

## 2023-03-22 LAB — LIPID PANEL
Chol/HDL Ratio: 1.9 ratio (ref 0.0–5.0)
Cholesterol, Total: 121 mg/dL (ref 100–199)
HDL: 64 mg/dL (ref >39)
LDL Chol Calc (NIH): 43 mg/dL (ref 0–99)
Triglycerides: 63 mg/dL (ref 0–149)
VLDL Cholesterol Cal: 14 mg/dL (ref 5–40)

## 2023-03-22 LAB — TSH+FREE T4
Free T4: 1.67 ng/dL (ref 0.82–1.77)
TSH: 3.36 u[IU]/mL (ref 0.450–4.500)

## 2023-03-24 NOTE — Progress Notes (Signed)
Hello Frank Rosales,  Your lab result is normal and/or stable.Some minor variations that are not significant are commonly marked abnormal, but do not represent any medical problem for you.  Best regards, Virna Livengood, M.D.

## 2023-04-01 NOTE — Progress Notes (Signed)
Pharmacy Medication Assistance Program Note    04/01/2023  Patient ID: Frank Rosales, male   DOB: Jun 29, 1955, 67 y.o.   MRN: [id number]     03/11/2023 04/01/2023  Outreach Medication One  Initial Outreach Date (Medication One) 02/18/2023   Manufacturer Medication One Charity fundraiser Drugs Marcelline Deist Farxiga  Dose of Farxiga 10MG  10MG   Type of Forensic scientist Assistance  Patient Assistance Determination  Approved  Approval Start Date  05/15/2023  Approval End Date  05/13/2024          03/11/2023 04/01/2023  Outreach Medication Two  Initial Outreach Date (Medication Two) 02/18/2023   Manufacturer Medication Two Astra Production designer, theatre/television/film Drugs Bretztri Bretztri  Dose of Breztri 160 160  Type of Forensic scientist Assistance  Patient Assistance Determination  Approved  Approval Start Date  05/15/2023

## 2023-04-16 ENCOUNTER — Telehealth: Payer: Self-pay | Admitting: *Deleted

## 2023-04-16 NOTE — Telephone Encounter (Signed)
Frank Rosales,  This pt has severe COPD and his procedure will need to be done at the hospital.  Best regards,   Cathlyn Parsons

## 2023-04-21 NOTE — Progress Notes (Unsigned)
Subjective:   Patient ID: Frank Rosales, male    DOB: 1955/06/02    MRN: [id number]    Brief patient profile:  24  yowm/MM "quit smoking"  11/2017  with h/o exposure to Asbestos  at [employer] ? Into the 80s  still able to walk  flat fine but steps x 3 flights sob with GOLD III copd 2013 prev eval by Dr Delford Field  / gets annual f/u in [place]    History of Present Illness  02/16/2016  f/u ov/Frank Rosales re:  GOLD III copd /  symbicort 160 2bid / twice weekly saba  Chief Complaint  Patient presents with   Follow-up    Pt states had CT Chest done 12/27/15- ordered by worker's comp for eval of previous asbestos exp. He states he had been doing well until July 2017 developed cough and congestion that lasted 2 months, but starting to improve.   sick July - September 2017 rx with abx/ steroids better since early Sept 2017 but CT was done in middle of this illness and was abnormal but was done for purpose of screening for asbestos  rec Plan A = Automatic = symbicort 160 Take 2 puffs first thing in am and then another 2 puffs about 12 hours later.  Work on inhaler technique:   Only use your albuterol(proair)  as a rescue medication  The key is to stop smoking completely before smoking completely stops you!        09/29/2021  f/u ov/Frank Rosales office/Frank Rosales re: GOLD 3  maint on breztri/dupixent and pepcid each evening   Chief Complaint  Patient presents with   Follow-up    Cough and chest congestion have cleared up   Dyspnea:  shop and back s stopping  Cough: better  Sleeping: ok flat bed / 2 pillows  SABA use: not needing  02: none  Covid status: vax x 5  Lung cancer screening done annually in [place]  Rec My office will be contacting you by phone for referral to ENT for hoarseness > Pollyann Kennedy 01/25/22 dx GERD        01/22/2023 22m f/u ov/Frank Rosales office/Frank Rosales re: GOLD 3 copd maint on Breztri / dupixent for [employer] Complaint  Patient presents with   Follow-up    6 month follow up   Dyspnea:  walks downhill to shop ok and back without stopping  Cough: some cough in AM min vol / clear  Sleeping: flat bed with 2 pillows s    resp cc  SABA use: none  02: none  Rec See Dr Darlyn Read w/in the next 2 days to reexamine your puncture wound and consider a tetanus shot  Make sure you check your oxygen saturation at your highest level of activity(NOT after you stop)  to be sure it stays over 90%  Call your lawyers about your follow up CT     04/23/2023  3 m f/u ov/Frank Rosales office/Frank Rosales re: GOLD 3 COPD  maint on breztri  plus dupixent for eczema   Chief Complaint  Patient presents with   COPD   Cough  Dyspnea:  no change shop and back s stopping same  Cough: min white in am  clear to light green tint  Sleeping: flat bed 2 pillows  s  resp cc  SABA use: none  02: none    No obvious day to day or daytime variability or assoc excess/ purulent sputum or mucus plugs or hemoptysis or cp or chest tightness, subjective  wheeze or overt sinus or hb symptoms.    Also denies any obvious fluctuation of symptoms with weather or environmental changes or other aggravating or alleviating factors except as outlined above   No unusual exposure hx or h/o childhood pna/ asthma or knowledge of premature birth.  Current Allergies, Complete Past Medical History, Past Surgical History, Family History, and Social History were reviewed in [employer] record.  ROS  The following are not active complaints unless bolded Hoarseness, sore throat, dysphagia, dental problems(teeth falling out/ under dental care) , itching, sneezing,  nasal congestion or discharge of excess mucus or purulent secretions, ear ache,   fever, chills, sweats, unintended wt loss or wt gain, classically pleuritic or exertional cp,  orthopnea pnd or arm/hand swelling  or leg swelling, presyncope, palpitations, abdominal pain, anorexia, nausea, vomiting, diarrhea  or change in bowel habits or change in bladder  habits, change in stools or change in urine, dysuria, hematuria,  rash, arthralgias, visual complaints, headache, numbness, weakness or ataxia or problems with walking or coordination,  change in mood or  memory.        Current Meds  Medication Sig   albuterol (PROVENTIL) (2.5 MG/3ML) 0.083% nebulizer solution Take 3 mLs (2.5 mg total) by nebulization every 4 (four) hours as needed for wheezing or shortness of breath.   Ascorbic Acid (VITAMIN C) 500 MG CAPS Take 500 mg by mouth daily.   aspirin EC 81 MG tablet Take 81 mg by mouth daily. Swallow whole.   atorvastatin (LIPITOR) 40 MG tablet TAKE 1 TABLET BY MOUTH DAILY FOR CHOLESTEROL   bisoprolol (ZEBETA) 5 MG tablet TAKE ONE-HALF TABLET BY MOUTH  DAILY   Budeson-Glycopyrrol-Formoterol (BREZTRI AEROSPHERE) 160-9-4.8 MCG/ACT AERO Inhale 2 puffs into the lungs 2 (two) times daily.   Cholecalciferol (VITAMIN D) 125 MCG (5000 UT) CAPS Take 5,000 Units by mouth daily.   cyanocobalamin (VITAMIN B12) 1000 MCG tablet Take 1,000 mcg by mouth daily.   dapagliflozin propanediol (FARXIGA) 10 MG TABS tablet Take 1 tablet (10 mg total) by mouth daily before breakfast.   diphenhydrAMINE HCl, Sleep, 25 MG CAPS Take by mouth.   dupilumab (DUPIXENT) 300 MG/2ML prefilled syringe Inject 300 mg into the skin every 14 (fourteen) days. Starting at day 15 for maintenance.   famotidine (PEPCID) 20 MG tablet Take 20 tablets by mouth at bedtime.   folic acid (FOLVITE) 1 MG tablet Take 1 tablet (1 mg total) by mouth daily. (Patient taking differently: Take 1 mg by mouth daily. 400 mcg)   glucose blood (ONETOUCH VERIO) test strip CHECK BLOOD SUGAR UP TO 4 TIMES DAILY DX E11.9   Krill Oil 500 MG CAPS Take 500 mg by mouth daily.   levothyroxine (SYNTHROID) 50 MCG tablet Take 1 tablet (50 mcg total) by mouth daily before breakfast.   metFORMIN (GLUCOPHAGE-XR) 750 MG 24 hr tablet Take 2 tablets (1,500 mg total) by mouth daily with breakfast.   NIFEdipine (ADALAT CC) 60 MG 24 hr  tablet Take 1 tablet (60 mg total) by mouth 2 (two) times daily. (Patient taking differently: Take 120 mg by mouth daily.)   olmesartan-hydrochlorothiazide (BENICAR HCT) 40-25 MG tablet Take 1 tablet by mouth daily.   OneTouch Delica Lancets 33G MISC CHECK BLOOD SUGAR UP TO 4 TIMES DAILY DX E11.9   tamsulosin (FLOMAX) 0.4 MG CAPS capsule TAKE 2 CAPSULES BY MOUTH AT  BEDTIME FOR URINE FLOW AND  PROSTATE   [DISCONTINUED] albuterol (PROAIR HFA) 108 (90 Base) MCG/ACT inhaler Inhale 2 puffs  into the lungs every 6 (six) hours.   Current Facility-Administered Medications for the 04/23/23 encounter (Office Visit) with Nyoka Cowden, MD  Medication   sodium chloride flush (NS) 0.9 % injection 3 mL              Objective:   Physical Exam  Wts  04/23/2023  210  01/22/2023    213  07/26/2022    210 04/02/2022  212 09/29/2021    212 04/03/2021  203  09/16/2020      222 03/03/2020  219  02/01/2020    217 10/05/2019    228 03/31/2019  213  01/24/2015    180 > 02/21/2015  180  > 06/16/2015  186  > 02/16/2016  205 > 09/13/2016   185 >  11/07/2016  187 >  12/21/2016  195 > 03/25/2017   200 > 06/25/2017   198  > 12/23/2017   199  > 06/13/2018  200      11/10/14 174 lb (78.926 kg)  11/08/14 176 lb 6.4 oz (80.015 kg)  10/27/14 175 lb (79.379 kg)   Vital signs reviewed  04/23/2023  - Note at rest 02 sats  91% on RA   General appearance:    pleasant amb wm/ gruff voice   HEENT : Oropharynx  clear   Nasal turbinates nl    NECK :  without  apparent JVD/ palpable Nodes/TM    LUNGS: no acc muscle use,  Mild barrel  contour chest wall with bilateral  Distant bs s audible wheeze and  without cough on insp or exp maneuvers  and mild  Hyperresonant  to  percussion bilaterally     CV:  RRR  no s3 or murmur or increase in P2, and no edema   ABD:  soft and nontender    MS:  Nl gait/ ext warm without deformities Or obvious joint restrictions  calf tenderness, cyanosis or clubbing     SKIN: warm and dry without  lesions    NEURO:  alert, approp, nl sensorium with  no motor or cerebellar deficits apparent.           Assessment & Plan:

## 2023-04-22 ENCOUNTER — Ambulatory Visit (AMBULATORY_SURGERY_CENTER): Payer: Medicare Other | Admitting: *Deleted

## 2023-04-22 VITALS — Ht 68.0 in | Wt 210.0 lb

## 2023-04-22 DIAGNOSIS — Z860100 Personal history of colon polyps, unspecified: Secondary | ICD-10-CM

## 2023-04-22 MED ORDER — NA SULFATE-K SULFATE-MG SULF 17.5-3.13-1.6 GM/177ML PO SOLN: 1.0000 | Freq: Once | ORAL | 0 refills | Status: AC

## 2023-04-22 NOTE — Progress Notes (Signed)
Pt's name and DOB verified at the beginning of the pre-visit wit 2 identifiers  Pt denies any difficulty with ambulating,sitting, laying down or rolling side to side  Pt has no issues with ambulation   Pt has no issues moving head neck or swallowing  No egg or soy allergy known to patient   No issues known to pt with past sedation with any surgeries or procedures  Pt denies having issues being intubated  No FH of Malignant Hyperthermia  Pt is not on diet pills or shots  Pt is not on home 02   Pt is not on blood thinners   Pt denies issues with constipation   Pt is not on dialysis  Pt denise any abnormal heart rhythms   Pt denies any upcoming cardiac testing  Pt encouraged to use to use Singlecare or Goodrx to reduce cost   Patient's chart reviewed by Cathlyn Parsons CNRA prior to pre-visit and patient appropriate for the [facility].  Pre-visit completed and red dot placed by patient's name on their procedure day (on provider's schedule).  .  Visit by phone  Pt states weight is 210 lb  Instructed pt why it is important to and  to call if they have any changes in health or new medications. Directed them to the # given and on instructions.     Instructions reviewed. Pt given both [facility] main # and MD on call # prior to instructions.  Pt states understanding. Instructed to review again prior to procedure. Pt states they will.   Instructions sent by mail with coupon and by My Chart   Coupon sent via text to mobile phone and pt verified they received it Pt states he drinks 4-5 beers a day

## 2023-04-23 ENCOUNTER — Encounter: Payer: Self-pay | Admitting: Internal Medicine

## 2023-04-23 ENCOUNTER — Ambulatory Visit: Payer: Medicare Other | Admitting: Internal Medicine

## 2023-04-23 VITALS — BP 104/69 | HR 93 | Ht 68.0 in | Wt 210.0 lb

## 2023-04-23 DIAGNOSIS — J449 Chronic obstructive pulmonary disease, unspecified: Secondary | ICD-10-CM

## 2023-04-23 DIAGNOSIS — J61 Pneumoconiosis due to asbestos and other mineral fibers: Secondary | ICD-10-CM

## 2023-04-23 MED ORDER — ALBUTEROL SULFATE HFA 108 (90 BASE) MCG/ACT IN AERS: 2.0000 | INHALATION_SPRAY | Freq: Four times a day (QID) | RESPIRATORY_TRACT | 1 refills | Status: DC

## 2023-04-23 NOTE — Patient Instructions (Signed)
No change in medications    Please schedule a follow up visit in 6  months but call sooner if needed  

## 2023-04-23 NOTE — Assessment & Plan Note (Addendum)
Last exp known ? 1980s wore respirator thereafter when known exp likely  - CT chest 12/27/15 ? Pleural scarring developing in bases > f/u planned by Asbestos lawyer/ Dr Valorie Roosevelt  last study  05/2017 no change   - CT chest 01/01/22 no evidence asbestos changes  pleural or parenchymal, no PF > has f/u plans per his attorney as of 01/22/2023   F/u planned in [place] now  and asked pt to provide me a copy  Ov q 6 m, sooner prn          Each maintenance medication was reviewed in detail including emphasizing most importantly the difference between maintenance and prns and under what circumstances the prns are to be triggered using an action plan format where appropriate.  Total time for H and P, chart review, counseling, reviewing hfa  device(s) and generating customized AVS unique to this office visit / same day charting = 23 min

## 2023-04-23 NOTE — Assessment & Plan Note (Signed)
Quit smoking 2019 MM Spirometry 11/2017  03/2011 FeV1 49%    Spirometry  02/28/2012  FEV1 45%   - Last day worked = October 13 2014  - PFT's  01/24/2015  FEV1 1.59  (43 % ) ratio 54   p 22 % improvement from saba with DLCO  88 % corrects to 99 % for alv volume s symbicort  - PFTs  12/27/15 1.30 (35%) ratio 35  In midst of a flare- - PFT's  12/21/2016  FEV1 1.40 (39 % ) ratio 50  p 8 % improvement from saba p symbicort 160 x 2  prior to study with DLCO  71/69 % corrects to 85  % for alv volume - PFTS  05/16/17  FEV1  1.32( 37%)  Ratio 37  And DLCO  52% > corrects to 78%  03/25/2017  try bevespi > preferred symb 160  - 06/13/2018    try adding spiriva 2.5 x 2 each am sample  > no better so did not fill rx  - 03/31/2019   Walked RA x one lap =  approx 250 ft @ mod pace - stopped due to sob with sats of 92% at the end of the study. - 02/01/2020  After extensive coaching inhaler device,  effectiveness =  90%  - alpha one AT def screen  02/02/20   MM  Level 151  -  09/16/2020   Walked RA   Moderate pace walk total 300 feet feeling short of breath at 200 feet but sats still 98% at end typical of a pink puffer - PFT's  01/01/22   FEV1 1.46(42 % ) ratio 0.42  p 0 % improvement from saba p ? prior to study with DLCO  19/7 (61%)  and FV curve classically concave  - 07/26/2022   Walked on RA   x  2  lap(s) =  approx 300  ft  @ slow/penguin gait   stopped due to sob  with lowest 02 sats 92%    - 04/23/2023  After extensive coaching inhaler device,  effectiveness =  90+     Group D (now reclassified as E) in terms of symptom/risk and laba/lama/ICS  therefore appropriate rx at this point >>>  breztri and approp saba

## 2023-05-02 ENCOUNTER — Other Ambulatory Visit: Payer: Self-pay | Admitting: Family Medicine

## 2023-05-02 DIAGNOSIS — E7800 Pure hypercholesterolemia, unspecified: Secondary | ICD-10-CM

## 2023-05-21 ENCOUNTER — Telehealth: Payer: Self-pay | Admitting: Gastroenterology

## 2023-05-21 NOTE — Telephone Encounter (Signed)
 Inbound call from patient stating that he is scheduled for a colonoscopy on 1/10 with Dr. Wilhelmenia. Patient states she the last couple of days he has woke up with a starchy throat and believes he might be getting a cold. Patient is requesting a call to discuss. Please advise.

## 2023-05-21 NOTE — Telephone Encounter (Signed)
 Spoke with pt ok to proceed pt to call back if symptoms worsen

## 2023-05-22 ENCOUNTER — Encounter: Payer: Self-pay | Admitting: Gastroenterology

## 2023-05-23 ENCOUNTER — Ambulatory Visit: Payer: Medicare Other | Admitting: Internal Medicine

## 2023-05-23 ENCOUNTER — Telehealth: Payer: Self-pay

## 2023-05-23 NOTE — Telephone Encounter (Signed)
 Received call from Norleen Schillings- informing me that patient was not a candidate for the [facility]  as per his telephone encounter on (04/16/23 )indicating due to patients severe COPD he would need to be done in hospital based setting. Pre-visit was completed on 04/22/23. I spoke with patient this afternoon, apologized for the late call and break-down of communication. Patient was very understanding and asked that I give him a call back tomorrow morning to discuss rescheduling his colonoscopy.

## 2023-05-24 ENCOUNTER — Other Ambulatory Visit: Payer: Self-pay | Admitting: Family Medicine

## 2023-05-24 ENCOUNTER — Encounter: Payer: Medicare Other | Admitting: Gastroenterology

## 2023-05-24 NOTE — Telephone Encounter (Signed)
 Spoke with patient this morning he would like to wait until March to have his colonoscopy because he is not feeling well.

## 2023-05-24 NOTE — Telephone Encounter (Signed)
 Frank Rosales, Thank you for taking this call. Reasonable for him to schedule at his convenience. I am still okay for him to be a direct colonoscopy to the hospital, but if he wants to be seen in clinic, that is okay as well. Thanks. GM

## 2023-05-28 ENCOUNTER — Other Ambulatory Visit: Payer: Self-pay

## 2023-05-28 DIAGNOSIS — Z860100 Personal history of colon polyps, unspecified: Secondary | ICD-10-CM

## 2023-05-28 NOTE — Telephone Encounter (Signed)
 Patient has been r/s to 07/16/23 @ [facility]. Updated prep instructions have been sent to the patient.

## 2023-05-30 ENCOUNTER — Telehealth: Payer: Self-pay

## 2023-05-30 NOTE — Telephone Encounter (Signed)
Patient's daughter, Ashely called requesting an appt. States she spoke with Maralyn Sago yesterday about her or Dr. Sherene Sires seeing him.  Complaint is weakness. States we have told them in the past that if he is ever not feeling well to just let the office no so an appt can be made. Per daughter she did not give Maralyn Sago his name yesterday when they spoke. Just checking to see if he needs to be seen by one of you or next available. Thanks!  Revonda Standard

## 2023-05-30 NOTE — Telephone Encounter (Signed)
Generalized weakness needs eval by PCP or UC nothing I can do over the phone though I  may have some openings on Monday the 20th or can see him as a work in  - to ER in meantime if getting worse

## 2023-06-02 NOTE — Progress Notes (Unsigned)
Subjective:   Patient ID: Frank Rosales, male    DOB: 01/02/1956    MRN: [id number]    Brief patient profile:  24  yowm/MM "quit smoking"  11/2017  with h/o exposure to Asbestos  at [employer] ? Into the 80s  still able to walk  flat fine but steps x 3 flights sob with GOLD III copd 2013 prev eval by Dr Delford Field  / gets annual f/u in [place]    History of Present Illness  02/16/2016  f/u ov/Destry Dauber re:  GOLD III copd /  symbicort 160 2bid / twice weekly saba  Chief Complaint  Patient presents with   Follow-up    Pt states had CT Chest done 12/27/15- ordered by worker's comp for eval of previous asbestos exp. He states he had been doing well until July 2017 developed cough and congestion that lasted 2 months, but starting to improve.   sick July - September 2017 rx with abx/ steroids better since early Sept 2017 but CT was done in middle of this illness and was abnormal but was done for purpose of screening for asbestos  rec Plan A = Automatic = symbicort 160 Take 2 puffs first thing in am and then another 2 puffs about 12 hours later.  Work on inhaler technique:   Only use your albuterol(proair)  as a rescue medication  The key is to stop smoking completely before smoking completely stops you!        09/29/2021  f/u ov/[facility]/Nivin Braniff re: GOLD 3  maint on breztri/dupixent and pepcid each evening   Chief Complaint  Patient presents with   Follow-up    Cough and chest congestion have cleared up   Dyspnea:  shop and back s stopping  Cough: better  Sleeping: ok flat bed / 2 pillows  SABA use: not needing  02: none  Covid status: vax x 5  Lung cancer screening done annually in [place]  Rec My office will be contacting you by phone for referral to ENT for hoarseness > Rosen 01/25/22 dx GERD      06/04/2023  ACUTE  ov/Selinda Korzeniewski re: GOLD 3 copd  maint on Breztri and dupixent  since 06/2018 and just pepcid 20 mg at hs  Chief Complaint  Patient presents with   Acute Visit     Productive cough with a lot of light green mucus x 2 weeks.  Sx some improved today.  Dyspnea:  shop and back s stopping along the way same as baseline   Cough: Productive cough with a lot of light green mucus x 2 weeks > thinning out a bit with mucinex dm but not sure he has the flutter valve still at home  Sleeping: flat bed  2 pillows s  resp cc  SABA use: none at all  02: none   Lung cancer screening :  per Asbestos doc in [place]     No obvious day to day or daytime variability or assoc   mucus plugs or hemoptysis or cp or chest tightness, subjective wheeze or overt sinus or hb symptoms.    Also denies any obvious fluctuation of symptoms with weather or environmental changes or other aggravating or alleviating factors except as outlined above   No unusual exposure hx or h/o childhood pna/ asthma or knowledge of premature birth.  Current Allergies, Complete Past Medical History, Past Surgical History, Family History, and Social History were reviewed in [employer] record.  ROS  The following  are not active complaints unless bolded Hoarseness, sore throat, dysphagia, dental problems, itching, sneezing,  nasal congestion or discharge of excess mucus or purulent secretions, ear ache,   fever, chills, sweats, unintended wt loss or wt gain, classically pleuritic or exertional cp,  orthopnea pnd or arm/hand swelling  or leg swelling, presyncope, palpitations, abdominal pain, anorexia, nausea, vomiting, diarrhea  or change in bowel habits or change in bladder habits, change in stools or change in urine, dysuria, hematuria,  rash, arthralgias, visual complaints, headache, numbness, weakness or ataxia or problems with walking or coordination,  change in mood or  memory.        Current Meds  Medication Sig   albuterol (PROAIR HFA) 108 (90 Base) MCG/ACT inhaler Inhale 2 puffs into the lungs every 6 (six) hours.   albuterol (PROVENTIL) (2.5 MG/3ML) 0.083% nebulizer  solution Take 3 mLs (2.5 mg total) by nebulization every 4 (four) hours as needed for wheezing or shortness of breath.   Ascorbic Acid (VITAMIN C) 500 MG CAPS Take 500 mg by mouth daily.   aspirin EC 81 MG tablet Take 81 mg by mouth daily. Swallow whole.   atorvastatin (LIPITOR) 40 MG tablet TAKE 1 TABLET BY MOUTH DAILY FOR CHOLESTEROL   bisoprolol (ZEBETA) 5 MG tablet TAKE ONE-HALF TABLET BY MOUTH  DAILY   Budeson-Glycopyrrol-Formoterol (BREZTRI AEROSPHERE) 160-9-4.8 MCG/ACT AERO Inhale 2 puffs into the lungs 2 (two) times daily.   Cholecalciferol (VITAMIN D) 125 MCG (5000 UT) CAPS Take 5,000 Units by mouth daily.   cyanocobalamin (VITAMIN B12) 1000 MCG tablet Take 1,000 mcg by mouth daily.   dapagliflozin propanediol (FARXIGA) 10 MG TABS tablet Take 1 tablet (10 mg total) by mouth daily before breakfast.   dextromethorphan-guaiFENesin (MUCINEX DM) 30-600 MG 12hr tablet Take 1 tablet by mouth at bedtime as needed for cough.   diphenhydrAMINE HCl, Sleep, 25 MG CAPS Take by mouth.   dupilumab (DUPIXENT) 300 MG/2ML prefilled syringe Inject 300 mg into the skin every 14 (fourteen) days. Starting at day 15 for maintenance.   famotidine (PEPCID) 20 MG tablet Take 20 tablets by mouth at bedtime.   folic acid (FOLVITE) 1 MG tablet Take 1 tablet (1 mg total) by mouth daily. (Patient taking differently: Take 1 mg by mouth daily. 400 mcg)   glucose blood (ONETOUCH VERIO) test strip CHECK BLOOD SUGAR UP TO 4 TIMES DAILY DX E11.9   Krill Oil 500 MG CAPS Take 500 mg by mouth daily.   levothyroxine (SYNTHROID) 50 MCG tablet Take 1 tablet (50 mcg total) by mouth daily before breakfast.   metFORMIN (GLUCOPHAGE-XR) 750 MG 24 hr tablet Take 2 tablets (1,500 mg total) by mouth daily with breakfast.   NIFEdipine (ADALAT CC) 60 MG 24 hr tablet Take 1 tablet (60 mg total) by mouth 2 (two) times daily. (Patient taking differently: Take 120 mg by mouth daily.)   olmesartan-hydrochlorothiazide (BENICAR HCT) 40-25 MG  tablet Take 1 tablet by mouth daily.   OneTouch Delica Lancets 33G MISC CHECK BLOOD SUGAR UP TO 4 TIMES DAILY DX E11.9   tamsulosin (FLOMAX) 0.4 MG CAPS capsule TAKE 2 CAPSULES BY MOUTH AT  BEDTIME FOR URINE FLOW AND  PROSTATE   Current Facility-Administered Medications for the 06/04/23 encounter (Office Visit) with Nyoka Cowden, MD  Medication   sodium chloride flush (NS) 0.9 % injection 3 mL            Objective:   Physical Exam  Wts  06/04/2023    204  04/23/2023  210  01/22/2023    213  07/26/2022    210 04/02/2022  212 09/29/2021    212 04/03/2021  203  09/16/2020      222 03/03/2020  219  02/01/2020    217 10/05/2019    228 03/31/2019  213  01/24/2015    180 > 02/21/2015  180  > 06/16/2015  186  > 02/16/2016  205 > 09/13/2016   185 >  11/07/2016  187 >  12/21/2016  195 > 03/25/2017   200 > 06/25/2017   198  > 12/23/2017   199  > 06/13/2018  200      11/10/14 174 lb (78.926 kg)  11/08/14 176 lb 6.4 oz (80.015 kg)  10/27/14 175 lb (79.379 kg)   Vital signs reviewed  06/04/2023  - Note at rest 02 sats  91% on RA   General appearance:    hoarse amb wm nad       HEENT : Oropharynx  clear/ few top < bottom teeth bad shape  Nasal turbinates nl    NECK :  without  apparent JVD/ palpable Nodes/TM    LUNGS: no acc muscle use,  Mild barrel  contour chest wall with bilateral  Distant bs s audible wheeze and  without cough on insp or exp maneuvers  and mild  Hyperresonant  to  percussion bilaterally     CV:  RRR  no s3 or murmur or increase in P2, and no edema   ABD:  soft and nontender with pos end  insp Hoover's  in the supine position.  No bruits or organomegaly appreciated   MS:  Nl gait/ ext warm without deformities Or obvious joint restrictions  calf tenderness, cyanosis or clubbing     SKIN: warm and dry without lesions    NEURO:  alert, approp, nl sensorium with  no motor or cerebellar deficits apparent.       May 14 2023 [place] no change    Assessment & Plan:

## 2023-06-04 ENCOUNTER — Encounter: Payer: Self-pay | Admitting: Internal Medicine

## 2023-06-04 ENCOUNTER — Ambulatory Visit: Payer: Medicare Other | Admitting: Internal Medicine

## 2023-06-04 VITALS — BP 110/60 | HR 71 | Temp 97.7°F | Ht 68.0 in | Wt 204.0 lb

## 2023-06-04 DIAGNOSIS — J449 Chronic obstructive pulmonary disease, unspecified: Secondary | ICD-10-CM

## 2023-06-04 DIAGNOSIS — R058 Other specified cough: Secondary | ICD-10-CM

## 2023-06-04 MED ORDER — AZITHROMYCIN 250 MG PO TABS: ORAL_TABLET | ORAL | 11 refills | Status: DC

## 2023-06-04 NOTE — Assessment & Plan Note (Addendum)
Quit smoking 2019 MM Spirometry 11/2017  03/2011 FeV1 49%    Spirometry  02/28/2012  FEV1 45%   - Last day worked = October 13 2014  - PFT's  01/24/2015  FEV1 1.59  (43 % ) ratio 54   p 22 % improvement from saba with DLCO  88 % corrects to 99 % for alv volume s symbicort  - PFTs  12/27/15 1.30 (35%) ratio 35  In midst of a flare- - PFT's  12/21/2016  FEV1 1.40 (39 % ) ratio 50  p 8 % improvement from saba p symbicort 160 x 2  prior to study with DLCO  71/69 % corrects to 85  % for alv volume - PFTS  05/16/17  FEV1  1.32( 37%)  Ratio 37  And DLCO  52% > corrects to 78%  03/25/2017  try bevespi > preferred symb 160  - 06/13/2018    try adding spiriva 2.5 x 2 each am sample  > no better so did not fill rx  - 03/31/2019   Walked RA x one lap =  approx 250 ft @ mod pace - stopped due to sob with sats of 92% at the end of the study. - 02/01/2020  After extensive coaching inhaler device,  effectiveness =  90%  - alpha one AT def screen  02/02/20   MM  Level 151  -  09/16/2020   Walked RA   Moderate pace walk total 300 feet feeling short of breath at 200 feet but sats still 98% at end typical of a pink puffer - PFT's  01/01/22   FEV1 1.46(42 % ) ratio 0.42  p 0 % improvement from saba p ? prior to study with DLCO  19/7 (61%)  and FV curve classically concave  - 07/26/2022   Walked on RA   x  2  lap(s) =  approx 300  ft  @ slow/penguin gait   stopped due to sob  with lowest 02 sats 92%    - 04/23/2023  After extensive coaching inhaler device,  effectiveness =  90+  - 06/04/2023 prn zpak    Mild aecopd but did not even increase his saba or use PPI in am as prev instructed and may have lost his flutter valve as well, but also note did not need prednisone so far  for this flare which occurred on dupixent so leave off systemic steroids for now   For cough/ congestion > mucinex D  or mucinex dm  up to maximum of  1200 mg every 12 hours and use the flutter valve as much as possible - call if need new rx  Keep zpak on hand  for flares   See dentist/ risk of lung abscess reviewed   F/u in 6 m, sooner if needed

## 2023-06-04 NOTE — Assessment & Plan Note (Signed)
Rec h2 hs 06/27/18 - Sinus CT 07/07/2018 >>> Clear sinuses - improved as of 11/13/2018 though on dupixent per derm for rash  - Assoc new daily hoarseness > rec ENT eval 09/29/2021  referral to ENT for hoarseness > Pollyann Kennedy 01/25/22 dx GERD  Reminded in event of cough:  add PPI every day ac until resolves to supplement  H 2 at HS  Each maintenance medication was reviewed in detail including emphasizing most importantly the difference between maintenance and prns and under what circumstances the prns are to be triggered using an action plan format where appropriate.  Total time for H and P, chart review, counseling, reviewing hfa/ neb device(s) and generating customized AVS unique to this ACUTE office visit / same day charting = 33 min

## 2023-06-04 NOTE — Patient Instructions (Signed)
For nasty mucus > zpak  (refill as needed)   For cough/ congestion >  mucinex dm (irritating) or Mucinex D (stuffy nose)   up to maximum of  1200 mg every 12 hours and use the flutter valve as much as you can    Please schedule a follow up visit in 6 months but call sooner if needed    .

## 2023-06-12 DIAGNOSIS — Z961 Presence of intraocular lens: Secondary | ICD-10-CM | POA: Diagnosis not present

## 2023-06-12 DIAGNOSIS — H04123 Dry eye syndrome of bilateral lacrimal glands: Secondary | ICD-10-CM | POA: Diagnosis not present

## 2023-06-14 NOTE — Telephone Encounter (Signed)
Per the change in the schedule the pt has been moved to 3/20 at 345 pm at [facility] with GM. The pt has been advised of the new instructions with date, and arrival time

## 2023-06-20 ENCOUNTER — Ambulatory Visit: Payer: Medicare Other | Admitting: Internal Medicine

## 2023-06-24 ENCOUNTER — Ambulatory Visit: Payer: Medicare Other | Admitting: Family Medicine

## 2023-06-28 ENCOUNTER — Other Ambulatory Visit: Payer: Self-pay | Admitting: Internal Medicine

## 2023-06-28 DIAGNOSIS — J449 Chronic obstructive pulmonary disease, unspecified: Secondary | ICD-10-CM

## 2023-07-11 ENCOUNTER — Other Ambulatory Visit: Payer: Self-pay | Admitting: Family Medicine

## 2023-07-11 DIAGNOSIS — E7800 Pure hypercholesterolemia, unspecified: Secondary | ICD-10-CM

## 2023-07-18 ENCOUNTER — Encounter: Payer: Self-pay | Admitting: Gastroenterology

## 2023-07-20 ENCOUNTER — Other Ambulatory Visit: Payer: Self-pay | Admitting: Family Medicine

## 2023-07-20 DIAGNOSIS — I10 Essential (primary) hypertension: Secondary | ICD-10-CM

## 2023-07-26 ENCOUNTER — Telehealth: Payer: Self-pay

## 2023-07-26 ENCOUNTER — Encounter (HOSPITAL_COMMUNITY): Payer: Self-pay | Admitting: Gastroenterology

## 2023-07-26 NOTE — Progress Notes (Signed)
 Pre op call eval Name:  Frank Rosales  PCP-Warren Stacks MD Pulmonologist- Dr Sherene Sires Cardiologist- Dr Jenene Slicker   CT- 01/01/22 EKG-08/08/22 Echo-09/11/22 Stress Test-n/a Cath-08/22/22 Blood thinner-n/a GLP-1-n/a  Hx: Asthma, COPD (severe), HTN, DM, arthritis, skin carcinomas. Last visit with cardiology was on 7/9 and that included a pre op clearance for hip surgery. Last visit with pulm was 06/04/23 at appt was still complaining of cough , was given zpack. Per pt still occasional cough but has COPD, no 02 just breathing treatments/inhalers. Able to get around ok, just cant stand for long periods due to arthritis. No recent medical changes. Anesthesia Review: Yes

## 2023-07-26 NOTE — Telephone Encounter (Signed)
 Procedure:colon Procedure date: 08/01/23 Procedure location: [facility] Arrival Time: 1455pm Spoke with the patient Y/N: y Any prep concerns? y  Has the patient obtained the prep from the pharmacy ? y Do you have a care partner and transportation: y Any additional concerns? n

## 2023-08-01 ENCOUNTER — Ambulatory Visit (HOSPITAL_COMMUNITY): Admitting: Certified Registered Nurse Anesthetist

## 2023-08-01 ENCOUNTER — Encounter (HOSPITAL_COMMUNITY): Payer: Self-pay | Admitting: Gastroenterology

## 2023-08-01 ENCOUNTER — Ambulatory Visit (HOSPITAL_BASED_OUTPATIENT_CLINIC_OR_DEPARTMENT_OTHER): Admitting: Certified Registered Nurse Anesthetist

## 2023-08-01 ENCOUNTER — Encounter (HOSPITAL_COMMUNITY)
Admission: RE | Disposition: A | Discharge status: Home / Self Care | Payer: Self-pay | Source: Home / Self Care | Attending: Gastroenterology

## 2023-08-01 ENCOUNTER — Other Ambulatory Visit: Payer: Self-pay

## 2023-08-01 ENCOUNTER — Ambulatory Visit (HOSPITAL_COMMUNITY)
Admission: RE | Admit: 2023-08-01 | Discharge: 2023-08-01 | Disposition: A | Discharge status: Home / Self Care | Payer: Medicare Other | Attending: Gastroenterology | Admitting: Gastroenterology

## 2023-08-01 DIAGNOSIS — E039 Hypothyroidism, unspecified: Secondary | ICD-10-CM | POA: Diagnosis not present

## 2023-08-01 DIAGNOSIS — D127 Benign neoplasm of rectosigmoid junction: Secondary | ICD-10-CM | POA: Diagnosis not present

## 2023-08-01 DIAGNOSIS — Z87891 Personal history of nicotine dependence: Secondary | ICD-10-CM | POA: Insufficient documentation

## 2023-08-01 DIAGNOSIS — F419 Anxiety disorder, unspecified: Secondary | ICD-10-CM | POA: Insufficient documentation

## 2023-08-01 DIAGNOSIS — K641 Second degree hemorrhoids: Secondary | ICD-10-CM | POA: Diagnosis not present

## 2023-08-01 DIAGNOSIS — D124 Benign neoplasm of descending colon: Secondary | ICD-10-CM | POA: Insufficient documentation

## 2023-08-01 DIAGNOSIS — K635 Polyp of colon: Secondary | ICD-10-CM | POA: Diagnosis not present

## 2023-08-01 DIAGNOSIS — K562 Volvulus: Secondary | ICD-10-CM

## 2023-08-01 DIAGNOSIS — K649 Unspecified hemorrhoids: Secondary | ICD-10-CM | POA: Diagnosis not present

## 2023-08-01 DIAGNOSIS — D122 Benign neoplasm of ascending colon: Secondary | ICD-10-CM | POA: Insufficient documentation

## 2023-08-01 DIAGNOSIS — Z833 Family history of diabetes mellitus: Secondary | ICD-10-CM | POA: Insufficient documentation

## 2023-08-01 DIAGNOSIS — M1990 Unspecified osteoarthritis, unspecified site: Secondary | ICD-10-CM | POA: Insufficient documentation

## 2023-08-01 DIAGNOSIS — D123 Benign neoplasm of transverse colon: Secondary | ICD-10-CM | POA: Diagnosis not present

## 2023-08-01 DIAGNOSIS — I10 Essential (primary) hypertension: Secondary | ICD-10-CM | POA: Insufficient documentation

## 2023-08-01 DIAGNOSIS — G4730 Sleep apnea, unspecified: Secondary | ICD-10-CM | POA: Diagnosis not present

## 2023-08-01 DIAGNOSIS — I2510 Atherosclerotic heart disease of native coronary artery without angina pectoris: Secondary | ICD-10-CM | POA: Insufficient documentation

## 2023-08-01 DIAGNOSIS — Z6830 Body mass index (BMI) 30.0-30.9, adult: Secondary | ICD-10-CM | POA: Insufficient documentation

## 2023-08-01 DIAGNOSIS — Z860100 Personal history of colon polyps, unspecified: Secondary | ICD-10-CM | POA: Diagnosis not present

## 2023-08-01 DIAGNOSIS — Z860101 Personal history of adenomatous and serrated colon polyps: Secondary | ICD-10-CM

## 2023-08-01 DIAGNOSIS — E669 Obesity, unspecified: Secondary | ICD-10-CM | POA: Diagnosis not present

## 2023-08-01 DIAGNOSIS — K644 Residual hemorrhoidal skin tags: Secondary | ICD-10-CM | POA: Insufficient documentation

## 2023-08-01 DIAGNOSIS — K219 Gastro-esophageal reflux disease without esophagitis: Secondary | ICD-10-CM | POA: Insufficient documentation

## 2023-08-01 DIAGNOSIS — Z1211 Encounter for screening for malignant neoplasm of colon: Secondary | ICD-10-CM

## 2023-08-01 DIAGNOSIS — E119 Type 2 diabetes mellitus without complications: Secondary | ICD-10-CM | POA: Insufficient documentation

## 2023-08-01 DIAGNOSIS — J449 Chronic obstructive pulmonary disease, unspecified: Secondary | ICD-10-CM | POA: Diagnosis not present

## 2023-08-01 HISTORY — PX: COLONOSCOPY WITH PROPOFOL: SHX5780

## 2023-08-01 HISTORY — PX: POLYPECTOMY: SHX5525

## 2023-08-01 LAB — GLUCOSE, CAPILLARY
Glucose-Capillary: 116 mg/dL — ABNORMAL HIGH (ref 70–99)
Glucose-Capillary: 109 mg/dL — ABNORMAL HIGH (ref 70–99)

## 2023-08-01 SURGERY — COLONOSCOPY WITH PROPOFOL
Anesthesia: Monitor Anesthesia Care

## 2023-08-01 MED ORDER — FAMOTIDINE 20 MG PO TABS: 20.0000 mg | ORAL_TABLET | Freq: Two times a day (BID) | ORAL | Status: AC

## 2023-08-01 MED ORDER — SODIUM CHLORIDE 0.9 % IV SOLN: INTRAVENOUS | Status: DC

## 2023-08-01 MED ORDER — PROPOFOL 500 MG/50ML IV EMUL
INTRAVENOUS | Status: AC
  Filled 2023-08-01: qty 50

## 2023-08-01 MED ORDER — SODIUM CHLORIDE 0.9 % IV SOLN
INTRAVENOUS | Status: AC | PRN
  Administered 2023-08-01: 500 mL via INTRAVENOUS

## 2023-08-01 MED ORDER — PROPOFOL 10 MG/ML IV BOLUS
INTRAVENOUS | Status: DC | PRN
  Administered 2023-08-01: 20 mg via INTRAVENOUS
  Administered 2023-08-01 (×2): 30 mg via INTRAVENOUS
  Administered 2023-08-01: 20 mg via INTRAVENOUS

## 2023-08-01 MED ORDER — PROPOFOL 500 MG/50ML IV EMUL
INTRAVENOUS | Status: DC | PRN
  Administered 2023-08-01: 100 ug/kg/min via INTRAVENOUS

## 2023-08-01 MED ORDER — PROPOFOL 10 MG/ML IV BOLUS
INTRAVENOUS | Status: AC
  Filled 2023-08-01: qty 20

## 2023-08-01 MED ORDER — EPINEPHRINE 1 MG/10ML IJ SOSY
PREFILLED_SYRINGE | INTRAMUSCULAR | Status: AC
  Filled 2023-08-01: qty 10

## 2023-08-01 SURGICAL SUPPLY — 20 items

## 2023-08-01 NOTE — Anesthesia Procedure Notes (Signed)
 Procedure Name: MAC Date/Time: 08/01/2023 3:50 PM  Performed by: Elyn Peers, CRNAPre-anesthesia Checklist: Patient identified, Emergency Drugs available, Suction available, Patient being monitored and Timeout performed Oxygen Delivery Method: Simple face mask Placement Confirmation: positive ETCO2

## 2023-08-01 NOTE — Op Note (Signed)
 [facility] Patient Name: Frank Rosales Procedure Date: 08/01/2023 MRN: [id number] Attending MD: Corliss Parish , MD, [id number] Date of Birth: February 08, 1956 CSN: [id number] Age: 68 Admit Type: Outpatient Procedure:                Colonoscopy Indications:              Surveillance: Personal history of adenomatous                            polyps on last colonoscopy > 5 years ago Providers:                Corliss Parish, MD, Fransisca Connors, Alan Ripper, Technician, Lynnell Jude. Freida Busman CRNA, CRNA Referring MD:              Medicines:                Monitored Anesthesia Care Complications:            No immediate complications. Estimated Blood Loss:     Estimated blood loss was minimal. Procedure:                Pre-Anesthesia Assessment:                           - Prior to the procedure, a History and Physical                            was performed, and patient medications and                            allergies were reviewed. The patient's tolerance of                            previous anesthesia was also reviewed. The risks                            and benefits of the procedure and the sedation                            options and risks were discussed with the patient.                            All questions were answered, and informed consent                            was obtained. Prior Anticoagulants: The patient has                            taken no anticoagulant or antiplatelet agents                            except for aspirin. ASA Grade Assessment: III - A  patient with severe systemic disease. After                            reviewing the risks and benefits, the patient was                            deemed in satisfactory condition to undergo the                            procedure.                           After obtaining informed consent, the colonoscope                             was passed under direct vision. Throughout the                            procedure, the patient's blood pressure, pulse, and                            oxygen saturations were monitored continuously. The                            CF-HQ190L ([id number]) Olympus colonoscope was                            introduced through the anus and advanced to the the                            cecum, identified by appendiceal orifice and                            ileocecal valve. The CF-HQ190L ([id number]) Olympus                            colonoscope was introduced through the anus and                            advanced to the the cecum, identified by                            appendiceal orifice and ileocecal valve. The                            colonoscopy was performed without difficulty. The                            patient tolerated the procedure. The quality of the                            bowel preparation was adequate. The ileocecal  valve, appendiceal orifice, and rectum were                            photographed. Scope In: 3:55:56 PM Scope Out: 4:40:01 PM Scope Withdrawal Time: 0 hours 39 minutes 41 seconds  Total Procedure Duration: 0 hours 44 minutes 5 seconds  Findings:      The digital rectal exam findings include hemorrhoids. Pertinent       negatives include no palpable rectal lesions.      The colon (entire examined portion) revealed moderately excessive       looping.      17, sessile polyps were found in the recto-sigmoid colon (4), descending       colon (3), transverse colon (7) and ascending colon (3). The polyps were       2 to 8 mm in size. These polyps were removed with a cold snare.       Resection and retrieval were complete.      Normal mucosa was found in the entire colon.      Non-bleeding non-thrombosed external and internal hemorrhoids were found       during retroflexion, during perianal exam and during digital exam. The        hemorrhoids were Grade II (internal hemorrhoids that prolapse but reduce       spontaneously). Impression:               - Hemorrhoids found on digital rectal exam.                           - There was significant looping of the colon.                           - 17, 2 to 8 mm polyps at the recto-sigmoid colon,                            in the descending colon, in the transverse colon                            and in the ascending colon, removed with a cold                            snare. Resected and retrieved.                           - Normal mucosa in the entire examined colon.                           - Non-bleeding non-thrombosed external and internal                            hemorrhoids. Moderate Sedation:      Not Applicable - Patient had care per Anesthesia. Recommendation:           - The patient will be observed post-procedure,                            until all discharge criteria are met.                           -  Discharge patient to home.                           - Patient has a contact number available for                            emergencies. The signs and symptoms of potential                            delayed complications were discussed with the                            patient. Return to normal activities tomorrow.                            Written discharge instructions were provided to the                            patient.                           - High fiber diet.                           - Use FiberCon 1-2 tablets PO daily.                           - Continue present medications.                           - Await pathology results.                           - Repeat colonoscopy in 1 year for surveillance.                           - The findings and recommendations were discussed                            with the patient.                           - The findings and recommendations were discussed                            with the  patient's family. Procedure Code(s):        --- Professional ---                           [phone], Colonoscopy, flexible; with removal of                            tumor(s), polyp(s), or other lesion(s) by snare                            technique Diagnosis Code(s):        ---  Professional ---                           Z86.010, Personal history of colonic polyps                           K64.1, Second degree hemorrhoids                           D12.7, Benign neoplasm of rectosigmoid junction                           D12.4, Benign neoplasm of descending colon                           D12.3, Benign neoplasm of transverse colon (hepatic                            flexure or splenic flexure)                           D12.2, Benign neoplasm of ascending colon CPT copyright 2022 American Medical Association. All rights reserved. The codes documented in this report are preliminary and upon coder review may  be revised to meet current compliance requirements. Corliss Parish, MD 08/01/2023 5:01:54 PM Number of Addenda: 0

## 2023-08-01 NOTE — Transfer of Care (Signed)
 Immediate Anesthesia Transfer of Care Note  Patient: Frank Rosales  Procedure(s) Performed: COLONOSCOPY WITH PROPOFOL POLYPECTOMY  Patient Location: PACU and Endoscopy Unit  Anesthesia Type:MAC  Level of Consciousness: awake, alert , and patient cooperative  Airway & Oxygen Therapy: Patient Spontanous Breathing and Patient connected to face mask oxygen  Post-op Assessment: Report given to RN and Post -op Vital signs reviewed and stable  Post vital signs: Reviewed and stable  Last Vitals:  Vitals Value Taken Time  BP 102/67 08/01/23 1650  Temp    Pulse 64 08/01/23 1650  Resp 16 08/01/23 1650  SpO2 99 % 08/01/23 1650  Vitals shown include unfiled device data.  Last Pain:  Vitals:   08/01/23 1444  TempSrc: Temporal  PainSc: 0-No pain         Complications: No notable events documented.

## 2023-08-01 NOTE — Anesthesia Preprocedure Evaluation (Signed)
 Anesthesia Evaluation  Patient identified by MRN, date of birth, ID band Patient awake    Reviewed: Allergy & Precautions, NPO status , Patient's Chart, lab work & pertinent test results  Airway Mallampati: II  TM Distance: >3 FB Neck ROM: Full    Dental no notable dental hx.    Pulmonary asthma , sleep apnea , COPD,  COPD inhaler, former smoker  multiple left-sided rib fractures    Pulmonary exam normal breath sounds clear to auscultation       Cardiovascular hypertension, Pt. on medications + CAD  Normal cardiovascular exam Rhythm:Regular Rate:Normal     Neuro/Psych  PSYCHIATRIC DISORDERS Anxiety        GI/Hepatic Neg liver ROS,GERD  ,,(+)     substance abuse  alcohol use  Endo/Other  negative endocrine ROSdiabetes, Type 2Hypothyroidism    Renal/GU negative Renal ROS     Musculoskeletal  (+) Arthritis , Osteoarthritis,    Abdominal  (+) + obese  Peds  Hematology   Anesthesia Other Findings   Reproductive/Obstetrics                             BP Readings from Last 3 Encounters:  08/01/23 138/81  06/04/23 110/60  04/23/23 104/69   Lab Results  Component Value Date   WBC 10.0 03/21/2023   HGB 17.7 03/21/2023   HCT 49.8 03/21/2023   MCV 101 (H) 03/21/2023   PLT 258 03/21/2023     Chemistry      Component Value Date/Time   NA 133 (L) 03/21/2023 0951   K 4.5 03/21/2023 0951   CL 95 (L) 03/21/2023 0951   CO2 21 03/21/2023 0951   BUN 8 03/21/2023 0951   CREATININE 1.04 03/21/2023 0951   CREATININE 0.96 08/06/2012 1619      Component Value Date/Time   CALCIUM 10.0 03/21/2023 0951   ALKPHOS 94 03/21/2023 0951   AST 35 03/21/2023 0951   ALT 37 03/21/2023 0951   BILITOT 0.8 03/21/2023 0951      Anesthesia Physical Anesthesia Plan  ASA: III  Anesthesia Plan: MAC   Post-op Pain Management: Minimal or no pain anticipated   Induction: Intravenous  PONV Risk Score  and Plan: 1 and Propofol infusion  Airway Management Planned: Simple Face Mask  Additional Equipment:   Intra-op Plan:   Post-operative Plan:   Informed Consent: I have reviewed the patients History and Physical, chart, labs and discussed the procedure including the risks, benefits and alternatives for the proposed anesthesia with the patient or authorized representative who has indicated his/her understanding and acceptance.     Dental advisory given  Plan Discussed with: CRNA and Anesthesiologist  Anesthesia Plan Comments:         Anesthesia Quick Evaluation

## 2023-08-01 NOTE — H&P (Signed)
 GASTROENTEROLOGY PROCEDURE H&P NOTE   Primary Care Physician: Mechele Claude, MD  HPI: Frank Rosales is a 68 y.o. male who presents for Colonoscopy for surveillance of previous polyps including advanced adenoma resection elsewhere years ago.  Past Medical History:  Diagnosis Date   Anxiety    Arthritis    "neck; lower back; right hip; right knee" (10/18/2014)   Asthma    Atypical nevus 03/27/2011   Mid Back-Severe (Exc)   Atypical nevus 01/20/2007   [facility] Flank-[facility]ed ([facility])   Cataract    Colon polyps    COPD (chronic obstructive pulmonary disease) (HCC)    Diabetes mellitus without complication (HCC)    Emphysema of lung (HCC)    GERD (gastroesophageal reflux disease)    High blood pressure    High cholesterol    Hilar adenopathy    Hypothyroidism    Multiple rib fractures 08/2016   left side   Nodular basal cell carcinoma (BCC) 02/18/2020   Left Temporal Scalp   Nodular basal cell carcinoma (BCC) 07/04/2021   Left Zygomatic Area   Nodular basal cell carcinoma (BCC) 07/04/2021   Left Scaphoid Fossa (curet and 5FU)   Osteoporosis    SCCA (squamous cell carcinoma) of skin 02/18/2020   Left Nasal Sidewall (well diff)   SCCA (squamous cell carcinoma) of skin 02/18/2020   Right Temple (well diff)   SCCA (squamous cell carcinoma) of skin 02/18/2020   Left Temple (well diff)   Squamous cell carcinoma in situ (SCCIS) 01/09/2016   Right Low Lip Med (Cx3,5FU,LN2) and Top Scalp Sup (Cx3,5FU)   Substance abuse (HCC)    Superficial nodular basal cell carcinoma (BCC) 01/09/2016   Left Sideburn(Exc)   Past Surgical History:  Procedure Laterality Date   epidural injections     FIXATION KYPHOPLASTY LUMBAR SPINE     "L1"   KNEE ARTHROSCOPY Right 05/14/2010   LEFT HEART CATH AND CORONARY ANGIOGRAPHY N/A 08/22/2022   Procedure: LEFT HEART CATH AND CORONARY ANGIOGRAPHY;  Surgeon: Corky Crafts, MD;  Location: [facility];  Service: Cardiovascular;   Laterality: N/A;   PLEURAL EFFUSION DRAINAGE Left 08/22/2016   Procedure: DRAINAGE OF HEMOTHORAX;  Surgeon: Loreli Slot, MD;  Location: [facility] OR;  Service: Thoracic;  Laterality: Left;   RIB PLATING Left 08/22/2016   Procedure: RIB PLATING;  Surgeon: Loreli Slot, MD;  Location: [facility] OR;  Service: Thoracic;  Laterality: Left;   VIDEO ASSISTED THORACOSCOPY Left 08/22/2016   Procedure: VIDEO ASSISTED THORACOSCOPY;  Surgeon: Loreli Slot, MD;  Location: [facility] OR;  Service: Thoracic;  Laterality: Left;   WISDOM TOOTH EXTRACTION     Current Facility-Administered Medications  Medication Dose Route Frequency Provider Last Rate Last Admin   0.9 %  sodium chloride infusion   Intravenous Continuous Mansouraty, Netty Starring., MD       0.9 %  sodium chloride infusion    Continuous PRN Mansouraty, Netty Starring., MD 10 mL/hr at 08/01/23 1453 500 mL at 08/01/23 1453    Current Facility-Administered Medications:    0.9 %  sodium chloride infusion, , Intravenous, Continuous, Mansouraty, Netty Starring., MD   0.9 %  sodium chloride infusion, , , Continuous PRN, Mansouraty, Netty Starring., MD, Last Rate: 10 mL/hr at 08/01/23 1453, 500 mL at 08/01/23 1453 No Known Allergies Family History  Problem Relation Age of Onset   Heart failure Mother    Diabetes Mother    Emphysema Father    Lung cancer Father  Heart disease Brother    Esophageal cancer Maternal Uncle    Colon polyps Neg Hx    Colon cancer Neg Hx    Stomach cancer Neg Hx    Rectal cancer Neg Hx    Social History   Socioeconomic History   Marital status: Married    Spouse name: Misty Stanley   Number of children: 1   Years of education: Not on file   Highest education level: Associate degree: occupational, Scientist, product/process development, or vocational program  Occupational History   Occupation: Patent attorney: [employer]    Comment: retired  Tobacco Use   Smoking status: Former    Current packs/day: 0.00    Average packs/day: 1.5 packs/day for 45.0  years (67.5 ttl pk-yrs)    Types: Cigarettes    Start date: 11/11/1972    Quit date: 11/11/2017    Years since quitting: 5.7    Passive exposure: Never   Smokeless tobacco: Never   Tobacco comments:        Vaping Use   Vaping status: Never Used  Substance and Sexual Activity   Alcohol use: Yes    Alcohol/week: 18.0 standard drinks of alcohol    Types: 18 Cans of beer per week    Comment: 11/28/20 - trying to cut back/quit - down to 2-3 per day   Drug use: No   Sexual activity: Not Currently  Other Topics Concern   Not on file  Social History Narrative   Disabled      Lives with wife      [employer]      Asbestosis, COPD - chronic SOB      Alcoholic - trying to quit; He did quit smoking 2019   Social Drivers of Health   Financial Resource Strain: Low Risk  (12/03/2022)   Overall Financial Resource Strain (CARDIA)    Difficulty of Paying Living Expenses: Not hard at all  Food Insecurity: No Food Insecurity (12/03/2022)   Hunger Vital Sign    Worried About Running Out of Food in the Last Year: Never true    Ran Out of Food in the Last Year: Never true  Transportation Needs: No Transportation Needs (12/03/2022)   PRAPARE - Administrator, Civil Service (Medical): No    Lack of Transportation (Non-Medical): No  Physical Activity: Inactive (12/03/2022)   Exercise Vital Sign    Days of Exercise per Week: 0 days    Minutes of Exercise per Session: 0 min  Stress: No Stress Concern Present (12/03/2022)   [employer] of Occupational Health - Occupational Stress Questionnaire    Feeling of Stress : Not at all  Social Connections: Moderately Isolated (12/03/2022)   Social Connection and Isolation Panel [NHANES]    Frequency of Communication with Friends and Family: More than three times a week    Frequency of Social Gatherings with Friends and Family: More than three times a week    Attends Religious Services: Never    Database administrator or Organizations: No     Attends Banker Meetings: Never    Marital Status: Married  Catering manager Violence: Not At Risk (12/03/2022)   Humiliation, Afraid, Rape, and Kick questionnaire    Fear of Current or Ex-Partner: No    Emotionally Abused: No    Physically Abused: No    Sexually Abused: No    Physical Exam: Today's Vitals   08/01/23 1444  BP: 138/81  Pulse: 81  Resp: 11  Temp: 98 F (36.7 C)  TempSrc: Temporal  SpO2: 95%  Weight: 90.7 kg  Height: 5\' 8"  (1.727 m)  PainSc: 0-No pain   Body mass index is 30.41 kg/m. GEN: NAD EYE: Sclerae anicteric ENT: MMM CV: Non-tachycardic GI: Soft, NT/ND NEURO:  Alert & Oriented x 3  Lab Results: No results for input(s): "WBC", "HGB", "HCT", "PLT" in the last 72 hours. BMET No results for input(s): "NA", "K", "CL", "CO2", "GLUCOSE", "BUN", "CREATININE", "CALCIUM" in the last 72 hours. LFT No results for input(s): "PROT", "ALBUMIN", "AST", "ALT", "ALKPHOS", "BILITOT", "BILIDIR", "IBILI" in the last 72 hours. PT/INR No results for input(s): "LABPROT", "INR" in the last 72 hours.   Impression / Plan: This is a 68 y.o.male who presents for Colonoscopy for surveillance of previous polyps including advanced adenoma resection elsewhere years ago.  The risks and benefits of endoscopic evaluation/treatment were discussed with the patient and/or family; these include but are not limited to the risk of perforation, infection, bleeding, missed lesions, lack of diagnosis, severe illness requiring hospitalization, as well as anesthesia and sedation related illnesses.  The patient's history has been reviewed, patient examined, no change in status, and deemed stable for procedure.  The patient and/or family is agreeable to proceed.    Corliss Parish, MD [facility] Advanced [facility]oscopy Office # [phone]

## 2023-08-01 NOTE — Discharge Instructions (Signed)

## 2023-08-02 ENCOUNTER — Other Ambulatory Visit: Payer: Self-pay | Admitting: Family Medicine

## 2023-08-02 NOTE — Anesthesia Postprocedure Evaluation (Signed)
 Anesthesia Post Note  Patient: Frank Rosales  Procedure(s) Performed: COLONOSCOPY WITH PROPOFOL POLYPECTOMY     Patient location during evaluation: PACU Anesthesia Type: MAC Level of consciousness: awake and alert Pain management: pain level controlled Vital Signs Assessment: post-procedure vital signs reviewed and stable Respiratory status: spontaneous breathing, nonlabored ventilation, respiratory function stable and patient connected to nasal cannula oxygen Cardiovascular status: blood pressure returned to baseline and stable Postop Assessment: no apparent nausea or vomiting Anesthetic complications: no   No notable events documented.  Last Vitals:  Vitals:   08/01/23 1715 08/01/23 1716  BP:    Pulse: 64 64  Resp: 15 15  Temp:    SpO2: 93% 92%    Last Pain:  Vitals:   08/01/23 1710  TempSrc:   PainSc: 0-No pain   Pain Goal:                   Lowella Curb

## 2023-08-04 ENCOUNTER — Encounter (HOSPITAL_COMMUNITY): Payer: Self-pay | Admitting: Gastroenterology

## 2023-08-05 ENCOUNTER — Ambulatory Visit: Payer: Medicare Other | Admitting: Family Medicine

## 2023-08-05 ENCOUNTER — Telehealth: Payer: Self-pay

## 2023-08-05 LAB — SURGICAL PATHOLOGY

## 2023-08-05 NOTE — Telephone Encounter (Signed)
 Na

## 2023-08-06 ENCOUNTER — Encounter: Payer: Self-pay | Admitting: Gastroenterology

## 2023-08-15 ENCOUNTER — Ambulatory Visit: Payer: Medicare Other | Admitting: Internal Medicine

## 2023-08-19 ENCOUNTER — Encounter: Payer: Self-pay | Admitting: Internal Medicine

## 2023-08-19 ENCOUNTER — Ambulatory Visit: Payer: Medicare Other | Attending: Internal Medicine | Admitting: Internal Medicine

## 2023-08-19 VITALS — BP 118/70 | HR 78 | Ht 68.0 in | Wt 205.6 lb

## 2023-08-19 DIAGNOSIS — I2510 Atherosclerotic heart disease of native coronary artery without angina pectoris: Secondary | ICD-10-CM

## 2023-08-19 DIAGNOSIS — I10 Essential (primary) hypertension: Secondary | ICD-10-CM

## 2023-08-19 NOTE — Patient Instructions (Signed)
Medication Instructions:  Your physician recommends that you continue on your current medications as directed. Please refer to the Current Medication list given to you today.   Labwork: None  Testing/Procedures: None  Follow-Up: Your physician recommends that you schedule a follow-up appointment in: 1 year. You will receive a reminder call in about 8 months reminding you to schedule your appointment. If you don't receive this call, please contact our office.   Any Other Special Instructions Will Be Listed Below (If Applicable).  Thank you for choosing [facility]!      If you need a refill on your cardiac medications before your next appointment, please call your pharmacy.

## 2023-08-19 NOTE — Progress Notes (Signed)
 Cardiology Office Note  Date: 08/19/2023   ID: Frank Rosales, DOB 05-Aug-1955, MRN [id number]  PCP:  Mechele Claude, MD  Cardiologist:  Marjo Bicker, MD Electrophysiologist:  None    History of Present Illness: Frank Rosales is a 68 y.o. male known to have two-vessel CAD on medical management, COPD, HTN, DM 2, HLD is here for follow-up visit.  Patient had exposure to asbestos during his work years and as a result, has been having DOE. CT chest from 8/23 showed severe coronary artery calcifications. He was initially referred to cardiology clinic for preop cardiac risk stratification for hip replacement surgery. Due to METs less than 4, he underwent NM stress test that was high risk study based on chest pain during the test and perfusion defects. He subsequently underwent LHC that showed small LAD with severe ostial, mid and apical disease, wide and patent LCx with branch disease and normal RCA.  He is here for follow-up visit.  No interval angina.  Has stable DOE chronically from his severe COPD.  No dizziness, lightheadedness, syncope, leg swelling.  Quit smoking 40 years ago.  Past Medical History:  Diagnosis Date   Anxiety    Arthritis    "neck; lower back; right hip; right knee" (10/18/2014)   Asthma    Atypical nevus 03/27/2011   Mid Back-Severe (Exc)   Atypical nevus 01/20/2007   Right Flank-Marked ([facility])   Cataract    Colon polyps    COPD (chronic obstructive pulmonary disease) (HCC)    Diabetes mellitus without complication (HCC)    Emphysema of lung (HCC)    GERD (gastroesophageal reflux disease)    High blood pressure    High cholesterol    Hilar adenopathy    Hypothyroidism    Multiple rib fractures 08/2016   left side   Nodular basal cell carcinoma (BCC) 02/18/2020   Left Temporal Scalp   Nodular basal cell carcinoma (BCC) 07/04/2021   Left Zygomatic Area   Nodular basal cell carcinoma (BCC) 07/04/2021   Left Scaphoid Fossa (curet and  5FU)   Osteoporosis    SCCA (squamous cell carcinoma) of skin 02/18/2020   Left Nasal Sidewall (well diff)   SCCA (squamous cell carcinoma) of skin 02/18/2020   Right Temple (well diff)   SCCA (squamous cell carcinoma) of skin 02/18/2020   Left Temple (well diff)   Squamous cell carcinoma in situ (SCCIS) 01/09/2016   Right Low Lip Med (Cx3,5FU,LN2) and Top Scalp Sup (Cx3,5FU)   Substance abuse (HCC)    Superficial nodular basal cell carcinoma (BCC) 01/09/2016   Left Sideburn(Exc)    Past Surgical History:  Procedure Laterality Date   COLONOSCOPY WITH PROPOFOL N/A 08/01/2023   Procedure: COLONOSCOPY WITH PROPOFOL;  Surgeon: Lemar Lofty., MD;  Location: [facility];  Service: Gastroenterology;  Laterality: N/A;   epidural injections     FIXATION KYPHOPLASTY LUMBAR SPINE     "L1"   KNEE ARTHROSCOPY Right 05/14/2010   LEFT HEART CATH AND CORONARY ANGIOGRAPHY N/A 08/22/2022   Procedure: LEFT HEART CATH AND CORONARY ANGIOGRAPHY;  Surgeon: Corky Crafts, MD;  Location: [facility] INVASIVE CV LAB;  Service: Cardiovascular;  Laterality: N/A;   PLEURAL EFFUSION DRAINAGE Left 08/22/2016   Procedure: DRAINAGE OF HEMOTHORAX;  Surgeon: Loreli Slot, MD;  Location[facility] OR;  Service: Thoracic;  Laterality: Left;   POLYPECTOMY  08/01/2023   Procedure: POLYPECTOMY;  Surgeon: Lemar Lofty., MD;  Location: [facility];  Service: Gastroenterology;;  RIB PLATING Left 08/22/2016   Procedure: RIB PLATING;  Surgeon: Loreli Slot, MD;  Location: [facility] OR;  Service: Thoracic;  Laterality: Left;   VIDEO ASSISTED THORACOSCOPY Left 08/22/2016   Procedure: VIDEO ASSISTED THORACOSCOPY;  Surgeon: Loreli Slot, MD;  Location: [facility] OR;  Service: Thoracic;  Laterality: Left;   WISDOM TOOTH EXTRACTION      Current Outpatient Medications  Medication Sig Dispense Refill   albuterol (PROVENTIL) (2.5 MG/3ML) 0.083% nebulizer solution Take 3 mLs (2.5 mg total) by nebulization every  4 (four) hours as needed for wheezing or shortness of breath. 240 mL 5   albuterol (VENTOLIN HFA) 108 (90 Base) MCG/ACT inhaler USE 2 INHALATIONS BY MOUTH EVERY 6 HOURS 34 g 2   Ascorbic Acid (VITAMIN C) 500 MG CAPS Take 500 mg by mouth daily.     aspirin EC 81 MG tablet Take 81 mg by mouth daily. Swallow whole.     atorvastatin (LIPITOR) 40 MG tablet TAKE 1 TABLET BY MOUTH DAILY FOR CHOLESTEROL 100 tablet 0   bisoprolol (ZEBETA) 5 MG tablet TAKE ONE-HALF TABLET BY MOUTH  DAILY 50 tablet 2   Budeson-Glycopyrrol-Formoterol (BREZTRI AEROSPHERE) 160-9-4.8 MCG/ACT AERO Inhale 2 puffs into the lungs 2 (two) times daily. 32.1 g 5   Cholecalciferol (VITAMIN D) 125 MCG (5000 UT) CAPS Take 5,000 Units by mouth daily.     cyanocobalamin (VITAMIN B12) 1000 MCG tablet Take 1,000 mcg by mouth daily.     dapagliflozin propanediol (FARXIGA) 10 MG TABS tablet Take 1 tablet (10 mg total) by mouth daily before breakfast. 90 tablet 5   diphenhydrAMINE HCl, Sleep, 25 MG CAPS Take by mouth at bedtime.     dupilumab (DUPIXENT) 300 MG/2ML prefilled syringe Inject 300 mg into the skin every 14 (fourteen) days. Starting at day 15 for maintenance. 12 mL 3   famotidine (PEPCID) 20 MG tablet Take 1 tablet (20 mg total) by mouth 2 (two) times daily.     folic acid (FOLVITE) 1 MG tablet Take 1 tablet (1 mg total) by mouth daily. (Patient taking differently: Take 1 mg by mouth daily. 400 mcg) 90 tablet 3   glucose blood (ONETOUCH VERIO) test strip CHECK BLOOD SUGAR UP TO 4 TIMES DAILY DX E11.9 400 strip 3   Krill Oil 500 MG CAPS Take 500 mg by mouth daily.     levothyroxine (SYNTHROID) 50 MCG tablet Take 1 tablet (50 mcg total) by mouth daily before breakfast. 100 tablet 3   metFORMIN (GLUCOPHAGE-XR) 750 MG 24 hr tablet Take 2 tablets (1,500 mg total) by mouth daily with breakfast. 200 tablet 3   NIFEdipine (ADALAT CC) 60 MG 24 hr tablet TAKE 1 TABLET BY MOUTH TWICE  DAILY 200 tablet 0   olmesartan-hydrochlorothiazide (BENICAR  HCT) 40-25 MG tablet Take 1 tablet by mouth daily. 100 tablet 3   OneTouch Delica Lancets 33G MISC CHECK BLOOD SUGAR UP TO 4 TIMES DAILY DX E11.9 400 each 3   tamsulosin (FLOMAX) 0.4 MG CAPS capsule TAKE 2 CAPSULES BY MOUTH AT  BEDTIME FOR URINE FLOW AND  PROSTATE 200 capsule 0   Current Facility-Administered Medications  Medication Dose Route Frequency Provider Last Rate Last Admin   sodium chloride flush (NS) 0.9 % injection 3 mL  3 mL Intravenous Q12H Sharlene Dory, NP       Allergies:  Patient has no known allergies.   Social History: The patient  reports that he quit smoking about 5 years ago. His smoking use included cigarettes. He  started smoking about 50 years ago. He has a 67.5 pack-year smoking history. He has never been exposed to tobacco smoke. He has never used smokeless tobacco. He reports current alcohol use of about 18.0 standard drinks of alcohol per week. He reports that he does not use drugs.   Family History: The patient's family history includes Diabetes in his mother; Emphysema in his father; Esophageal cancer in his maternal uncle; Heart disease in his brother; Heart failure in his mother; Lung cancer in his father.   ROS:  Please see the history of present illness. Otherwise, complete review of systems is positive for none.  All other systems are reviewed and negative.   Physical Exam: VS:  BP 118/70   Pulse 78   Ht 5\' 8"  (1.727 m)   Wt 205 lb 9.6 oz (93.3 kg)   SpO2 93%   BMI 31.26 kg/m , BMI Body mass index is 31.26 kg/m.  Wt Readings from Last 3 Encounters:  08/19/23 205 lb 9.6 oz (93.3 kg)  08/01/23 200 lb (90.7 kg)  06/04/23 204 lb (92.5 kg)    General: Patient appears comfortable at rest. HEENT: Conjunctiva and lids normal, oropharynx clear with moist mucosa. Neck: Supple, no elevated JVP or carotid bruits, no thyromegaly. Lungs: Clear to auscultation, nonlabored breathing at rest. Cardiac: Regular rate and rhythm, no S3 or significant systolic  murmur, no pericardial rub. Abdomen: Soft, nontender, no hepatomegaly, bowel sounds present, no guarding or rebound. Extremities: No pitting edema, distal pulses 2+. Skin: Warm and dry. Musculoskeletal: No kyphosis. Neuropsychiatric: Alert and oriented x3, affect grossly appropriate.  ECG:  An ECG dated 08/08/22 was personally reviewed today and demonstrated:  Sinus tachycardia, HR 104 bpm and RBBB  Recent Labwork: 03/21/2023: ALT 37; AST 35; BUN 8; Creatinine, Ser 1.04; Hemoglobin 17.7; Platelets 258; Potassium 4.5; Sodium 133; TSH 3.360     Component Value Date/Time   CHOL 121 03/21/2023 0951   TRIG 63 03/21/2023 0951   TRIG 99 12/03/2013 0858   HDL 64 03/21/2023 0951   HDL 75 12/03/2013 0858   CHOLHDL 1.9 03/21/2023 0951   LDLCALC 43 03/21/2023 0951   LDLCALC 125 (H) 12/03/2013 0858     Assessment and Plan:  CAD manifested by positive cardiac stress test s/p two-vessel CAD on medical management: LHC in 2024 showed small LAD with severe ostial, mid and apical disease.  There appears to be R to L collaterals feeding the septal perforator branch.  Given diffuse disease, LAD is not at target for revascularization either surgically or percutaneously. Medical management is recommended.  He has a large LCx with branch disease.  Does not have any angina.  He does have chronic stable DOE from severe COPD.  Continue aspirin 81 mg once daily, atorvastatin 40 mg at bedtime.  On Farxiga 10 mg once daily.  Discussed about starting Ozempic but patient refused as he does not want to be on any more medications unless absolutely needed.  HLD, at goal: Continue atorvastatin 40 mg nightly.  Goal LDL less than 70.  HTN, controlled: Continue nifedipine 60 mg twice daily and olmesartan-HCTZ 40-25 mg once daily.  Follows up with PCP.  DM2: Currently on metformin 1500 mg once daily and Farxiga 10 mg once daily.  Follows up with PCP.    Medication Adjustments/Labs and Tests Ordered: Current medicines  are reviewed at length with the patient today.  Concerns regarding medicines are outlined above.   Tests Ordered: Orders Placed This Encounter  Procedures   EKG  12-Lead    Medication Changes: No orders of the defined types were placed in this encounter.   Disposition:  Follow up 1 year  Signed Malakye Nolden Verne Spurr, MD, 08/19/2023 1:12 PM    [facility] at [facility] [address] [place], [place], [place] [address]

## 2023-08-20 ENCOUNTER — Encounter: Payer: Self-pay | Admitting: Family Medicine

## 2023-08-20 ENCOUNTER — Ambulatory Visit (INDEPENDENT_AMBULATORY_CARE_PROVIDER_SITE_OTHER): Admitting: Family Medicine

## 2023-08-20 VITALS — BP 134/71 | HR 81 | Temp 98.1°F | Ht 68.0 in | Wt 204.0 lb

## 2023-08-20 DIAGNOSIS — Z7984 Long term (current) use of oral hypoglycemic drugs: Secondary | ICD-10-CM

## 2023-08-20 DIAGNOSIS — E119 Type 2 diabetes mellitus without complications: Secondary | ICD-10-CM

## 2023-08-20 DIAGNOSIS — E782 Mixed hyperlipidemia: Secondary | ICD-10-CM | POA: Diagnosis not present

## 2023-08-20 DIAGNOSIS — E039 Hypothyroidism, unspecified: Secondary | ICD-10-CM

## 2023-08-20 DIAGNOSIS — I10 Essential (primary) hypertension: Secondary | ICD-10-CM | POA: Diagnosis not present

## 2023-08-20 DIAGNOSIS — J449 Chronic obstructive pulmonary disease, unspecified: Secondary | ICD-10-CM | POA: Diagnosis not present

## 2023-08-20 DIAGNOSIS — L309 Dermatitis, unspecified: Secondary | ICD-10-CM | POA: Diagnosis not present

## 2023-08-20 LAB — BAYER DCA HB A1C WAIVED: HB A1C (BAYER DCA - WAIVED): 5.5 % (ref 4.8–5.6)

## 2023-08-20 NOTE — Progress Notes (Signed)
 Subjective:  Patient ID: Frank Rosales,  male    DOB: 06/12/1955  Age: 68 y.o.    CC: Diabetes   HPI Frank Rosales presents for  follow-up of hypertension. Patient has no history of headache chest pain or shortness of breath or recent cough. Patient also denies symptoms of TIA such as numbness weakness lateralizing. Patient denies side effects from medication. States taking it regularly.  Patient also  in for follow-up of elevated cholesterol. Doing well without complaints on current medication. Denies side effects  including myalgia and arthralgia and nausea. Also in today for liver function testing. Currently no chest pain, shortness of breath or other cardiovascular related symptoms noted.  Follow-up of diabetes. Patient does check blood sugar at home. Readings run between 70 and 160. Patient denies symptoms such as excessive hunger or urinary frequency, excessive hunger, nausea No significant hypoglycemic spells noted. Medications reviewed. Pt reports taking them regularly. Pt. denies complication/adverse reaction today.    History Frank Rosales has a past medical history of Anxiety, Arthritis, Asthma, Atypical nevus (03/27/2011), Atypical nevus (01/20/2007), Cataract, Colon polyps, COPD (chronic obstructive pulmonary disease) (HCC), Diabetes mellitus without complication (HCC), Emphysema of lung (HCC), GERD (gastroesophageal reflux disease), High blood pressure, High cholesterol, Hilar adenopathy, Hypothyroidism, Multiple rib fractures (08/2016), Nodular basal cell carcinoma (BCC) (02/18/2020), Nodular basal cell carcinoma (BCC) (07/04/2021), Nodular basal cell carcinoma (BCC) (07/04/2021), Osteoporosis, SCCA (squamous cell carcinoma) of skin (02/18/2020), SCCA (squamous cell carcinoma) of skin (02/18/2020), SCCA (squamous cell carcinoma) of skin (02/18/2020), Squamous cell carcinoma in situ (SCCIS) (01/09/2016), Substance abuse (HCC), and Superficial nodular basal cell carcinoma (BCC)  (01/09/2016).   He has a past surgical history that includes Knee arthroscopy (Right, 05/14/2010); Wisdom tooth extraction; epidural injections; Fixation kyphoplasty lumbar spine; Video assisted thoracoscopy (Left, 08/22/2016); Pleural effusion drainage (Left, 08/22/2016); Rib plating (Left, 08/22/2016); LEFT HEART CATH AND CORONARY ANGIOGRAPHY (N/A, 08/22/2022); Colonoscopy with propofol (N/A, 08/01/2023); and polypectomy (08/01/2023).   His family history includes Diabetes in his mother; Emphysema in his father; Esophageal cancer in his maternal uncle; Heart disease in his brother; Heart failure in his mother; Lung cancer in his father.He reports that he quit smoking about 5 years ago. His smoking use included cigarettes. He started smoking about 50 years ago. He has a 67.5 pack-year smoking history. He has never been exposed to tobacco smoke. He has never used smokeless tobacco. He reports current alcohol use of about 18.0 standard drinks of alcohol per week. He reports that he does not use drugs.  Current Outpatient Medications on File Prior to Visit  Medication Sig Dispense Refill   albuterol (PROVENTIL) (2.5 MG/3ML) 0.083% nebulizer solution Take 3 mLs (2.5 mg total) by nebulization every 4 (four) hours as needed for wheezing or shortness of breath. 240 mL 5   albuterol (VENTOLIN HFA) 108 (90 Base) MCG/ACT inhaler USE 2 INHALATIONS BY MOUTH EVERY 6 HOURS 34 g 2   Ascorbic Acid (VITAMIN C) 500 MG CAPS Take 500 mg by mouth daily.     aspirin EC 81 MG tablet Take 81 mg by mouth daily. Swallow whole.     atorvastatin (LIPITOR) 40 MG tablet TAKE 1 TABLET BY MOUTH DAILY FOR CHOLESTEROL 100 tablet 0   bisoprolol (ZEBETA) 5 MG tablet TAKE ONE-HALF TABLET BY MOUTH  DAILY 50 tablet 2   Budeson-Glycopyrrol-Formoterol (BREZTRI AEROSPHERE) 160-9-4.8 MCG/ACT AERO Inhale 2 puffs into the lungs 2 (two) times daily. 32.1 g 5   Cholecalciferol (VITAMIN D) 125 MCG (5000 UT) CAPS Take 5,000  Units by mouth daily.      cyanocobalamin (VITAMIN B12) 1000 MCG tablet Take 1,000 mcg by mouth daily.     dapagliflozin propanediol (FARXIGA) 10 MG TABS tablet Take 1 tablet (10 mg total) by mouth daily before breakfast. 90 tablet 5   diphenhydrAMINE HCl, Sleep, 25 MG CAPS Take by mouth at bedtime.     dupilumab (DUPIXENT) 300 MG/2ML prefilled syringe Inject 300 mg into the skin every 14 (fourteen) days. Starting at day 15 for maintenance. 12 mL 3   famotidine (PEPCID) 20 MG tablet Take 1 tablet (20 mg total) by mouth 2 (two) times daily.     folic acid (FOLVITE) 1 MG tablet Take 1 tablet (1 mg total) by mouth daily. (Patient taking differently: Take 1 mg by mouth daily. 400 mcg) 90 tablet 3   glucose blood (ONETOUCH VERIO) test strip CHECK BLOOD SUGAR UP TO 4 TIMES DAILY DX E11.9 400 strip 3   Krill Oil 500 MG CAPS Take 500 mg by mouth daily.     levothyroxine (SYNTHROID) 50 MCG tablet Take 1 tablet (50 mcg total) by mouth daily before breakfast. 100 tablet 3   metFORMIN (GLUCOPHAGE-XR) 750 MG 24 hr tablet Take 2 tablets (1,500 mg total) by mouth daily with breakfast. 200 tablet 3   NIFEdipine (ADALAT CC) 60 MG 24 hr tablet TAKE 1 TABLET BY MOUTH TWICE  DAILY 200 tablet 0   olmesartan-hydrochlorothiazide (BENICAR HCT) 40-25 MG tablet Take 1 tablet by mouth daily. 100 tablet 3   OneTouch Delica Lancets 33G MISC CHECK BLOOD SUGAR UP TO 4 TIMES DAILY DX E11.9 400 each 3   tamsulosin (FLOMAX) 0.4 MG CAPS capsule TAKE 2 CAPSULES BY MOUTH AT  BEDTIME FOR URINE FLOW AND  PROSTATE 200 capsule 0   Current Facility-Administered Medications on File Prior to Visit  Medication Dose Route Frequency Provider Last Rate Last Admin   sodium chloride flush (NS) 0.9 % injection 3 mL  3 mL Intravenous Q12H Sharlene Dory, NP        ROS Review of Systems  Constitutional:  Negative for fever.  Respiratory:  Negative for shortness of breath (good as long as he doesn't do too much. Breztri helps.) and wheezing.   Cardiovascular:  Negative  for chest pain.  Musculoskeletal:  Positive for arthralgias (right knee and hip).  Skin:  Negative for rash.    Objective:  BP 134/71   Pulse 81   Temp 98.1 F (36.7 C)   Ht 5\' 8"  (1.727 m)   Wt 204 lb (92.5 kg)   SpO2 92%   BMI 31.02 kg/m   BP Readings from Last 3 Encounters:  08/20/23 134/71  08/19/23 118/70  08/01/23 129/71    Wt Readings from Last 3 Encounters:  08/20/23 204 lb (92.5 kg)  08/19/23 205 lb 9.6 oz (93.3 kg)  08/01/23 200 lb (90.7 kg)    Lab Results  Component Value Date   HGBA1C 5.9 (H) 03/21/2023   HGBA1C 6.1 (H) 12/12/2022   HGBA1C 5.9 (H) 06/13/2022    Physical Exam Vitals reviewed.  Constitutional:      Appearance: He is well-developed.  HENT:     Head: Normocephalic and atraumatic.     Right Ear: External ear normal.     Left Ear: External ear normal.     Mouth/Throat:     Pharynx: No oropharyngeal exudate or posterior oropharyngeal erythema.  Eyes:     Pupils: Pupils are equal, round, and reactive to light.  Cardiovascular:  Rate and Rhythm: Normal rate and regular rhythm.     Heart sounds: No murmur heard. Pulmonary:     Effort: No respiratory distress.     Comments: Decreased breath sounds, consistent with COPD, baseline  Musculoskeletal:     Cervical back: Normal range of motion and neck supple.  Neurological:     Mental Status: He is alert and oriented to person, place, and time.         Assessment & Plan:  Controlled type 2 diabetes mellitus without complication, without long-term current use of insulin (HCC) -     Bayer DCA Hb A1c Waived  Mixed hyperlipidemia -     Lipid panel  Essential hypertension -     CBC with Differential/Platelet -     CMP14+EGFR  Hypothyroidism, unspecified type -     TSH  Eczema, unspecified type  COPD GOLD III     Follow-up: Return in about 3 months (around 11/19/2023).  Mechele Claude, M.D.

## 2023-08-21 ENCOUNTER — Other Ambulatory Visit

## 2023-08-21 DIAGNOSIS — I10 Essential (primary) hypertension: Secondary | ICD-10-CM | POA: Diagnosis not present

## 2023-08-21 DIAGNOSIS — E039 Hypothyroidism, unspecified: Secondary | ICD-10-CM | POA: Diagnosis not present

## 2023-08-21 DIAGNOSIS — E782 Mixed hyperlipidemia: Secondary | ICD-10-CM | POA: Diagnosis not present

## 2023-08-21 LAB — LIPID PANEL

## 2023-08-22 ENCOUNTER — Other Ambulatory Visit: Payer: Self-pay | Admitting: Family Medicine

## 2023-08-22 DIAGNOSIS — E039 Hypothyroidism, unspecified: Secondary | ICD-10-CM

## 2023-08-22 LAB — CBC WITH DIFFERENTIAL/PLATELET
Basophils Absolute: 0.1 10*3/uL (ref 0.0–0.2)
Basos: 1 %
EOS (ABSOLUTE): 0.2 10*3/uL (ref 0.0–0.4)
Eos: 2 %
Hematocrit: 47.4 % (ref 37.5–51.0)
Hemoglobin: 16.5 g/dL (ref 13.0–17.7)
Immature Grans (Abs): 0 10*3/uL (ref 0.0–0.1)
Immature Granulocytes: 0 %
Lymphocytes Absolute: 1.2 10*3/uL (ref 0.7–3.1)
Lymphs: 15 %
MCH: 35.1 pg — ABNORMAL HIGH (ref 26.6–33.0)
MCHC: 34.8 g/dL (ref 31.5–35.7)
MCV: 101 fL — ABNORMAL HIGH (ref 79–97)
Monocytes Absolute: 0.8 10*3/uL (ref 0.1–0.9)
Monocytes: 9 %
Neutrophils Absolute: 5.9 10*3/uL (ref 1.4–7.0)
Neutrophils: 73 %
Platelets: 231 10*3/uL (ref 150–450)
RBC: 4.7 x10E6/uL (ref 4.14–5.80)
RDW: 12.6 % (ref 11.6–15.4)
WBC: 8.2 10*3/uL (ref 3.4–10.8)

## 2023-08-22 LAB — CMP14+EGFR
ALT: 24 IU/L (ref 0–44)
AST: 27 IU/L (ref 0–40)
Albumin: 4.8 g/dL (ref 3.9–4.9)
Alkaline Phosphatase: 86 IU/L (ref 44–121)
BUN/Creatinine Ratio: 10 (ref 10–24)
BUN: 11 mg/dL (ref 8–27)
Bilirubin Total: 1 mg/dL (ref 0.0–1.2)
CO2: 20 mmol/L (ref 20–29)
Calcium: 9.7 mg/dL (ref 8.6–10.2)
Chloride: 93 mmol/L — ABNORMAL LOW (ref 96–106)
Creatinine, Ser: 1.15 mg/dL (ref 0.76–1.27)
Globulin, Total: 2.7 g/dL (ref 1.5–4.5)
Glucose: 153 mg/dL — ABNORMAL HIGH (ref 70–99)
Potassium: 4.4 mmol/L (ref 3.5–5.2)
Sodium: 133 mmol/L — ABNORMAL LOW (ref 134–144)
Total Protein: 7.5 g/dL (ref 6.0–8.5)
eGFR: 71 mL/min/{1.73_m2} (ref >59)

## 2023-08-22 LAB — TSH: TSH: 4.62 u[IU]/mL — ABNORMAL HIGH (ref 0.450–4.500)

## 2023-08-22 LAB — LIPID PANEL
Cholesterol, Total: 120 mg/dL (ref 100–199)
HDL: 57 mg/dL (ref >39)
LDL CALC COMMENT:: 2.1 ratio (ref 0.0–5.0)
LDL Chol Calc (NIH): 45 mg/dL (ref 0–99)
Triglycerides: 94 mg/dL (ref 0–149)
VLDL Cholesterol Cal: 18 mg/dL (ref 5–40)

## 2023-08-22 MED ORDER — LEVOTHYROXINE SODIUM 75 MCG PO TABS: 75.0000 ug | ORAL_TABLET | Freq: Every day | ORAL | 1 refills | Status: DC

## 2023-08-27 DIAGNOSIS — L2089 Other atopic dermatitis: Secondary | ICD-10-CM | POA: Diagnosis not present

## 2023-08-27 DIAGNOSIS — L814 Other melanin hyperpigmentation: Secondary | ICD-10-CM | POA: Diagnosis not present

## 2023-08-27 DIAGNOSIS — D225 Melanocytic nevi of trunk: Secondary | ICD-10-CM | POA: Diagnosis not present

## 2023-08-27 DIAGNOSIS — L821 Other seborrheic keratosis: Secondary | ICD-10-CM | POA: Diagnosis not present

## 2023-08-27 DIAGNOSIS — L538 Other specified erythematous conditions: Secondary | ICD-10-CM | POA: Diagnosis not present

## 2023-08-27 DIAGNOSIS — L2989 Other pruritus: Secondary | ICD-10-CM | POA: Diagnosis not present

## 2023-08-27 DIAGNOSIS — L820 Inflamed seborrheic keratosis: Secondary | ICD-10-CM | POA: Diagnosis not present

## 2023-08-27 DIAGNOSIS — L910 Hypertrophic scar: Secondary | ICD-10-CM | POA: Diagnosis not present

## 2023-08-27 DIAGNOSIS — L578 Other skin changes due to chronic exposure to nonionizing radiation: Secondary | ICD-10-CM | POA: Diagnosis not present

## 2023-09-13 ENCOUNTER — Telehealth: Payer: Self-pay

## 2023-09-13 NOTE — Telephone Encounter (Signed)
 Rec'd letter from [employer] patient assistance company requesting refills for patients meds Farxiga  and Breztri .   Please send 90 day RX's to [facility] for shipment. Thanks!

## 2023-09-15 ENCOUNTER — Other Ambulatory Visit: Payer: Self-pay | Admitting: Family Medicine

## 2023-09-15 DIAGNOSIS — J449 Chronic obstructive pulmonary disease, unspecified: Secondary | ICD-10-CM

## 2023-09-15 DIAGNOSIS — E119 Type 2 diabetes mellitus without complications: Secondary | ICD-10-CM

## 2023-09-15 MED ORDER — BREZTRI AEROSPHERE 160-9-4.8 MCG/ACT IN AERO: 2.0000 | INHALATION_SPRAY | Freq: Two times a day (BID) | RESPIRATORY_TRACT | 3 refills | Status: DC

## 2023-09-15 MED ORDER — DAPAGLIFLOZIN PROPANEDIOL 10 MG PO TABS: 10.0000 mg | ORAL_TABLET | Freq: Every day | ORAL | 3 refills | Status: DC

## 2023-09-19 ENCOUNTER — Other Ambulatory Visit: Payer: Self-pay | Admitting: Family Medicine

## 2023-09-19 DIAGNOSIS — E7800 Pure hypercholesterolemia, unspecified: Secondary | ICD-10-CM

## 2023-09-28 ENCOUNTER — Other Ambulatory Visit: Payer: Self-pay | Admitting: Family Medicine

## 2023-09-28 DIAGNOSIS — I10 Essential (primary) hypertension: Secondary | ICD-10-CM

## 2023-10-11 ENCOUNTER — Other Ambulatory Visit: Payer: Self-pay | Admitting: Family Medicine

## 2023-10-27 NOTE — Progress Notes (Unsigned)
 Subjective:   Patient ID: Frank Rosales, male    DOB: [birth year]    MRN: [id number]    Brief patient profile:  84  yowm/MM quit smoking  11/2017  with h/o exposure to Asbestos  at [employer] ? Into the 80s  still able to walk  flat fine but steps x 3 flights sob with GOLD III copd 2013 prev eval by Dr Brent Cambric  / gets annual f/u in [place]    History of Present Illness  02/16/2016  f/u ov/Taren Dymek re:  GOLD III copd /  symbicort  160 2bid / twice weekly saba  Chief Complaint  Patient presents with   Follow-up    Pt states had CT Chest done 12/27/15- ordered by worker's comp for eval of previous asbestos exp. He states he had been doing well until July 2017 developed cough and congestion that lasted 2 months, but starting to improve.   sick July - September 2017 rx with abx/ steroids better since early Sept 2017 but CT was done in middle of this illness and was abnormal but was done for purpose of screening for asbestos  rec Plan A = Automatic = symbicort  160 Take 2 puffs first thing in am and then another 2 puffs about 12 hours later.  Work on inhaler technique:   Only use your albuterol (proair )  as a rescue medication  The key is to stop smoking completely before smoking completely stops you!        09/29/2021  f/u ov/[place] office/Marycruz Boehner re: GOLD 3  maint on breztri /dupixent  and pepcid  each evening   Chief Complaint  Patient presents with   Follow-up    Cough and chest congestion have cleared up   Dyspnea:  shop and back s stopping  Cough: better  Sleeping: ok flat bed / 2 pillows  SABA use: not needing  02: none  Covid status: vax x 5  Lung cancer screening done annually in [place]  Rec My office will be contacting you by phone for referral to ENT for hoarseness > [facility] 01/25/22 dx GERD      06/04/2023  ACUTE  ov/Jennel Mara re: GOLD 3 copd  maint on Breztri  and dupixent   since 06/2018 and just pepcid  20 mg at hs  Chief Complaint  Patient presents with   Acute Visit     Productive cough with a lot of light green mucus x 2 weeks.  Sx some improved today.  Dyspnea:  shop and back s stopping along the way same as baseline   Cough: Productive cough with a lot of light green mucus x 2 weeks > thinning out a bit with mucinex  dm but not sure he has the flutter valve still at home  Sleeping: flat bed  2 pillows s  resp cc  SABA use: none at all  02: none  Rec For nasty mucus > zpak  (refill as needed)  For cough/ congestion >  mucinex  dm (irritating) or Mucinex  D (stuffy nose)  up to maximum of  1200 mg every 12 hours and use the flutter valve as much as you can     10/28/2023  f/u ov/[place] office/Chamberlain Steinborn re: COPD GOLD 3/ rx Breztri  / dupixent   and 2 benadryl  hs cc dry mouth  Chief Complaint  Patient presents with   COPD   Follow-up  Dyspnea:  still can get shop to house s stopping/ sits and and rests s inhaler  Cough: not much/ no need for mucinex  / flutter  Sleeping: bed  is flat with 2 pillows s noct or am   resp cc  SABA use: none  02: none  Lung cancer screening: per per [employer] q December    No obvious day to day or daytime variability or assoc excess/ purulent sputum or mucus plugs or hemoptysis or cp or chest tightness, subjective wheeze or overt sinus or hb symptoms.    Also denies any obvious fluctuation of symptoms with weather or environmental changes or other aggravating or alleviating factors except as outlined above   No unusual exposure hx or h/o childhood pna/ asthma or knowledge of premature birth.  Current Allergies, Complete Past Medical History, Past Surgical History, Family History, and Social History were reviewed in [employer] record.  ROS  The following are not active complaints unless bolded Hoarseness, sore throat, dysphagia, dental problems, itching, sneezing,  nasal congestion or discharge of excess mucus or purulent secretions, ear ache,   fever, chills, sweats, unintended wt loss or wt gain,  classically pleuritic or exertional cp,  orthopnea pnd or arm/hand swelling  or leg swelling, presyncope, palpitations, abdominal pain, anorexia, nausea, vomiting, diarrhea  or change in bowel habits or change in bladder habits, change in stools or change in urine, dysuria, hematuria,  rash, arthralgias, visual complaints, headache, numbness, weakness or ataxia or problems with walking or coordination,  change in mood or  memory.        Current Meds  Medication Sig   albuterol  (PROVENTIL ) (2.5 MG/3ML) 0.083% nebulizer solution Take 3 mLs (2.5 mg total) by nebulization every 4 (four) hours as needed for wheezing or shortness of breath.   albuterol  (VENTOLIN  HFA) 108 (90 Base) MCG/ACT inhaler USE 2 INHALATIONS BY MOUTH EVERY 6 HOURS   Ascorbic Acid (VITAMIN C) 500 MG CAPS Take 500 mg by mouth daily.   aspirin  EC 81 MG tablet Take 81 mg by mouth daily. Swallow whole.   atorvastatin  (LIPITOR) 40 MG tablet TAKE 1 TABLET BY MOUTH DAILY FOR CHOLESTEROL   bisoprolol  (ZEBETA ) 5 MG tablet TAKE ONE-HALF TABLET BY MOUTH  DAILY   budeson-glycopyrrolate -formoterol  (BREZTRI  AEROSPHERE) 160-9-4.8 MCG/ACT AERO inhaler Inhale 2 puffs into the lungs 2 (two) times daily.   Cholecalciferol  (VITAMIN D ) 125 MCG (5000 UT) CAPS Take 5,000 Units by mouth daily.   cyanocobalamin  (VITAMIN B12) 1000 MCG tablet Take 1,000 mcg by mouth daily.   dapagliflozin  propanediol (FARXIGA ) 10 MG TABS tablet Take 1 tablet (10 mg total) by mouth daily before breakfast.   diphenhydrAMINE  HCl, Sleep, 25 MG CAPS Take by mouth at bedtime.   dupilumab  (DUPIXENT ) 300 MG/2ML prefilled syringe Inject 300 mg into the skin every 14 (fourteen) days. Starting at day 15 for maintenance.   famotidine  (PEPCID ) 20 MG tablet Take 1 tablet (20 mg total) by mouth 2 (two) times daily.   folic acid  (FOLVITE ) 1 MG tablet Take 1 tablet (1 mg total) by mouth daily. (Patient taking differently: Take 1 mg by mouth daily. 400 mcg)   glucose blood (ONETOUCH VERIO)  test strip CHECK BLOOD SUGAR UP TO 4 TIMES DAILY DX E11.9   Krill Oil 500 MG CAPS Take 500 mg by mouth daily.   levothyroxine  (SYNTHROID ) 75 MCG tablet Take 1 tablet (75 mcg total) by mouth daily before breakfast.   metFORMIN  (GLUCOPHAGE -XR) 750 MG 24 hr tablet Take 2 tablets (1,500 mg total) by mouth daily with breakfast.   NIFEdipine  (ADALAT  CC) 60 MG 24 hr tablet TAKE 1 TABLET BY MOUTH TWICE  DAILY   olmesartan -hydrochlorothiazide  (  BENICAR  HCT) 40-25 MG tablet Take 1 tablet by mouth daily.   OneTouch Delica Lancets 33G MISC CHECK BLOOD SUGAR UP TO 4 TIMES DAILY DX E11.9   tamsulosin  (FLOMAX ) 0.4 MG CAPS capsule TAKE 2 CAPSULES BY MOUTH AT  BEDTIME FOR URINE FLOW AND  PROSTATE   Current Facility-Administered Medications for the 10/28/23 encounter (Office Visit) with Diamond Formica, MD  Medication   sodium chloride  flush (NS) 0.9 % injection 3 mL             Objective:   Physical Exam  Wts  10/28/2023    207  06/04/2023    204  04/23/2023  210  01/22/2023    213  07/26/2022    210 04/02/2022  212 09/29/2021    212 04/03/2021  203  09/16/2020      222 03/03/2020  219  02/01/2020    217 10/05/2019    228 03/31/2019  213  01/24/2015    180 > 02/21/2015  180  > 06/16/2015  186  > 02/16/2016  205 > 09/13/2016   185 >  11/07/2016  187 >  12/21/2016  195 > 03/25/2017   200 > 06/25/2017   198  > 12/23/2017   199  > 06/13/2018  200      11/10/14 174 lb (78.926 kg)  11/08/14 176 lb 6.4 oz (80.015 kg)  10/27/14 175 lb (79.379 kg)   Hoarse amb wm nad  HEENT : Oropharynx  clear/ multiple severely decayed teeth/   Nasal turbinates nl    NECK :  without  apparent JVD/ palpable Nodes/TM    LUNGS: no acc muscle use,  Mild barrel  contour chest wall with bilateral  Distant bs s audible wheeze and  without cough on insp or exp maneuvers  and mild  Hyperresonant  to  percussion bilaterally     CV:  RRR  no s3 or murmur or increase in P2, and no edema   ABD:  soft and nontender with pos end  insp  Hoover's  in the supine position.  No bruits or organomegaly appreciated   MS:  Nl gait/ ext warm without deformities Or obvious joint restrictions  calf tenderness, cyanosis or clubbing     SKIN: warm and dry without lesions    NEURO:  alert, approp, nl sensorium with  no motor or cerebellar deficits apparent.            Assessment & Plan:

## 2023-10-28 ENCOUNTER — Ambulatory Visit: Admitting: Internal Medicine

## 2023-10-28 ENCOUNTER — Encounter: Payer: Self-pay | Admitting: Internal Medicine

## 2023-10-28 VITALS — BP 107/67 | HR 86 | Ht 68.0 in | Wt 207.4 lb

## 2023-10-28 DIAGNOSIS — J449 Chronic obstructive pulmonary disease, unspecified: Secondary | ICD-10-CM | POA: Diagnosis not present

## 2023-10-28 NOTE — Assessment & Plan Note (Signed)
 Quit smoking 2019 MM Spirometry 11/2017  03/2011 FeV1 49%    Spirometry  02/28/2012  FEV1 45%   - Last day worked = October 13 2014  - PFT's  01/24/2015  FEV1 1.59  (43 % ) ratio 54   p 22 % improvement from saba with DLCO  88 % corrects to 99 % for alv volume s symbicort   - PFTs  12/27/15 1.30 (35%) ratio 35  In midst of a flare- - PFT's  12/21/2016  FEV1 1.40 (39 % ) ratio 50  p 8 % improvement from saba p symbicort  160 x 2  prior to study with DLCO  71/69 % corrects to 85  % for alv volume - PFTS  05/16/17  FEV1  1.32( 37%)  Ratio 37  And DLCO  52% > corrects to 78%  03/25/2017  try bevespi  > preferred symb 160  - 06/13/2018    try adding spiriva  2.5 x 2 each am sample  > no better so did not fill rx  -  rx Dupixent  06/2018 for eczema >>> - 03/31/2019   Walked RA x one lap =  approx 250 ft @ mod pace - stopped due to sob with sats of 92% at the end of the study. - 02/01/2020  After extensive coaching inhaler device,  effectiveness =  90%  - alpha one AT def screen  02/02/20   MM  Level 151  -  09/16/2020   Walked RA   Moderate pace walk total 300 feet feeling short of breath at 200 feet but sats still 98% at end typical of a pink puffer - PFT's  01/01/22   FEV1 1.46(42 % ) ratio 0.42  p 0 % improvement from saba p ? prior to study with DLCO  19/7 (61%)  and FV curve classically concave  - 07/26/2022   Walked on RA   x  2  lap(s) =  approx 300  ft  @ slow/penguin gait   stopped due to sob  with lowest 02 sats 92%    - 04/23/2023  After extensive coaching inhaler device,  effectiveness =  90+  - 06/04/2023 prn zpak     Group D (now reclassified as E) in terms of symptom/risk and laba/lama/ICS  therefore appropriate rx at this point >>>  breztri  and using approp saba with main c/o  = dry mouth so rec  1) sugar free jolly ranchers or life savers  2) try reduce breztri  to one bid if tol guided by doe from shop to house and need for saba (presently 0)  3) reduce benadryl  if possible (using 2 at hs for sleep  aide) or see Dr Veleta Gerold for alternative choices   F/u q 6 m sooner prn          Each maintenance medication was reviewed in detail including emphasizing most importantly the difference between maintenance and prns and under what circumstances the prns are to be triggered using an action plan format where appropriate.  Total time for H and P, chart review, counseling, reviewing hfa/ neb/ flutter  device(s) and generating customized AVS unique to this office visit / same day charting = 25 min

## 2023-10-28 NOTE — Patient Instructions (Signed)
 Ok to try breztri  one twice daily to see if helps dry mouth  - other option is symbicort  160   Other option is to reduce benadryl  use    Still strongly recommend you have your teeth evaluated by a dentist    Please schedule a follow up visit in  6 months but call sooner if needed

## 2023-10-31 ENCOUNTER — Other Ambulatory Visit: Payer: Self-pay | Admitting: Nurse Practitioner

## 2023-11-08 ENCOUNTER — Other Ambulatory Visit: Payer: Self-pay | Admitting: Family Medicine

## 2023-11-21 ENCOUNTER — Encounter: Payer: Self-pay | Admitting: Family Medicine

## 2023-11-21 ENCOUNTER — Ambulatory Visit: Payer: Self-pay | Admitting: Family Medicine

## 2023-11-21 ENCOUNTER — Ambulatory Visit: Admitting: Family Medicine

## 2023-11-21 VITALS — BP 112/61 | HR 76 | Temp 98.2°F | Ht 68.0 in | Wt 208.0 lb

## 2023-11-21 DIAGNOSIS — E119 Type 2 diabetes mellitus without complications: Secondary | ICD-10-CM

## 2023-11-21 DIAGNOSIS — E039 Hypothyroidism, unspecified: Secondary | ICD-10-CM | POA: Diagnosis not present

## 2023-11-21 DIAGNOSIS — E782 Mixed hyperlipidemia: Secondary | ICD-10-CM | POA: Diagnosis not present

## 2023-11-21 DIAGNOSIS — Z23 Encounter for immunization: Secondary | ICD-10-CM

## 2023-11-21 DIAGNOSIS — E7800 Pure hypercholesterolemia, unspecified: Secondary | ICD-10-CM | POA: Diagnosis not present

## 2023-11-21 DIAGNOSIS — I10 Essential (primary) hypertension: Secondary | ICD-10-CM

## 2023-11-21 DIAGNOSIS — L309 Dermatitis, unspecified: Secondary | ICD-10-CM

## 2023-11-21 LAB — BAYER DCA HB A1C WAIVED: HB A1C (BAYER DCA - WAIVED): 5.9 % — ABNORMAL HIGH (ref 4.8–5.6)

## 2023-11-21 MED ORDER — NIFEDIPINE ER 60 MG PO TB24: 60.0000 mg | ORAL_TABLET | Freq: Two times a day (BID) | ORAL | 0 refills | Status: DC

## 2023-11-21 MED ORDER — DUPIXENT 300 MG/2ML ~~LOC~~ SOSY: 300.0000 mg | PREFILLED_SYRINGE | SUBCUTANEOUS | 3 refills | Status: AC

## 2023-11-21 MED ORDER — OLMESARTAN MEDOXOMIL-HCTZ 40-25 MG PO TABS: 1.0000 | ORAL_TABLET | Freq: Every day | ORAL | 3 refills | Status: AC

## 2023-11-21 MED ORDER — ATORVASTATIN CALCIUM 40 MG PO TABS: ORAL_TABLET | ORAL | 0 refills | Status: DC

## 2023-11-21 MED ORDER — ACCU-CHEK GUIDE ME W/DEVICE KIT: PACK | 99 refills | Status: DC

## 2023-11-21 NOTE — Progress Notes (Signed)
 Subjective:  Patient ID: Frank Rosales, male    DOB: Nov 27, 1955  Age: 68 y.o. MRN: [id number]  CC: Diabetes (Needs new test strips. Insurance will cover only a select few. In letter that pt brought. )   HPI Frank Rosales presents for presents forFollow-up of diabetes. Patient checks blood sugar at home.   Around 130 fasting and 88 postprandial Patient denies symptoms such as polyuria, polydipsia, excessive hunger, nausea No significant hypoglycemic spells noted. Medications reviewed. Pt reports taking them regularly without complication/adverse reaction being reported today.  Lab Results  Component Value Date   HGBA1C 5.9 (H) 11/21/2023   HGBA1C 5.5 08/20/2023   HGBA1C 5.9 (H) 03/21/2023    presents for  follow-up of hypertension. Patient has no history of headache chest pain or shortness of breath or recent cough. Patient also denies symptoms of TIA such as focal numbness or weakness. Patient denies side effects from medication. States taking it regularly.   follow-up on  thyroid . The patient has a history of hypothyroidism for many years. It has been stable recently. Pt. denies any change in  voice, loss of hair, heat or cold intolerance. Energy level has been adequate to good. Patient denies constipation and diarrhea. No myxedema. Medication is as noted below. Verified that pt is taking it daily on an empty stomach. Well tolerated.   in for follow-up of elevated cholesterol. Doing well without complaints on current medication. Denies side effects of statin including myalgia and arthralgia and nausea. Currently no chest pain, shortness of breath or other cardiovascular related symptoms noted.      03/21/2023    9:43 AM 12/12/2022   10:57 AM 12/03/2022    1:51 PM  Depression screen PHQ 2/9  Decreased Interest 0 0 0  Down, Depressed, Hopeless 0 0 0  PHQ - 2 Score 0 0 0    History Frank Rosales has a past medical history of Anxiety, Arthritis, Asthma, Atypical nevus (03/27/2011),  Atypical nevus (01/20/2007), Cataract, Colon polyps, COPD (chronic obstructive pulmonary disease) (HCC), Diabetes mellitus without complication (HCC), Emphysema of lung (HCC), GERD (gastroesophageal reflux disease), High blood pressure, High cholesterol, Hilar adenopathy, Hypothyroidism, Multiple rib fractures (08/2016), Nodular basal cell carcinoma (BCC) (02/18/2020), Nodular basal cell carcinoma (BCC) (07/04/2021), Nodular basal cell carcinoma (BCC) (07/04/2021), Osteoporosis, SCCA (squamous cell carcinoma) of skin (02/18/2020), SCCA (squamous cell carcinoma) of skin (02/18/2020), SCCA (squamous cell carcinoma) of skin (02/18/2020), Squamous cell carcinoma in situ (SCCIS) (01/09/2016), Substance abuse (HCC), and Superficial nodular basal cell carcinoma (BCC) (01/09/2016).   He has a past surgical history that includes Knee arthroscopy (Right, 05/14/2010); Wisdom tooth extraction; epidural injections; Fixation kyphoplasty lumbar spine; Video assisted thoracoscopy (Left, 08/22/2016); Pleural effusion drainage (Left, 08/22/2016); Rib plating (Left, 08/22/2016); LEFT HEART CATH AND CORONARY ANGIOGRAPHY (N/A, 08/22/2022); Colonoscopy with propofol  (N/A, 08/01/2023); and polypectomy (08/01/2023).   His family history includes Diabetes in his mother; Emphysema in his father; Esophageal cancer in his maternal uncle; Heart disease in his brother; Heart failure in his mother; Lung cancer in his father.He reports that he quit smoking about 6 years ago. His smoking use included cigarettes. He started smoking about 51 years ago. He has a 67.5 pack-year smoking history. He has never been exposed to tobacco smoke. He has never used smokeless tobacco. He reports current alcohol use of about 18.0 standard drinks of alcohol per week. He reports that he does not use drugs.    ROS Review of Systems  Constitutional: Negative.  Negative for fever.  HENT: Negative.  Eyes:  Negative for visual disturbance.  Respiratory:   Positive for cough and shortness of breath.   Cardiovascular:  Negative for chest pain and leg swelling.  Gastrointestinal:  Negative for abdominal pain, diarrhea, nausea and vomiting.  Genitourinary:  Negative for difficulty urinating.  Musculoskeletal:  Negative for arthralgias and myalgias.  Skin:  Negative for rash.  Neurological:  Negative for headaches.  Psychiatric/Behavioral:  Negative for sleep disturbance.     Objective:  BP 112/61   Pulse 76   Temp 98.2 F (36.8 C)   Ht 5' 8 (1.727 m)   Wt 208 lb (94.3 kg)   SpO2 94%   BMI 31.63 kg/m   BP Readings from Last 3 Encounters:  11/21/23 112/61  10/28/23 107/67  08/20/23 134/71    Wt Readings from Last 3 Encounters:  11/21/23 208 lb (94.3 kg)  10/28/23 207 lb 6.4 oz (94.1 kg)  08/20/23 204 lb (92.5 kg)     Physical Exam Vitals reviewed.  Constitutional:      Appearance: He is well-developed.  HENT:     Head: Normocephalic and atraumatic.     Right Ear: External ear normal.     Left Ear: External ear normal.     Mouth/Throat:     Pharynx: No oropharyngeal exudate or posterior oropharyngeal erythema.  Eyes:     Pupils: Pupils are equal, round, and reactive to light.  Cardiovascular:     Rate and Rhythm: Normal rate and regular rhythm.     Heart sounds: No murmur heard. Pulmonary:     Effort: No respiratory distress.     Breath sounds: Normal breath sounds.  Musculoskeletal:     Cervical back: Normal range of motion and neck supple.  Neurological:     Mental Status: He is alert and oriented to person, place, and time.      Assessment & Plan:  Hypothyroidism, unspecified type -     TSH + free T4  Controlled type 2 diabetes mellitus without complication, without long-term current use of insulin (HCC) -     Bayer DCA Hb A1c Waived -     Microalbumin / creatinine urine ratio  Mixed hyperlipidemia -     CMP14+EGFR -     Lipid panel  Essential hypertension -     CBC with Differential/Platelet -      Olmesartan  Medoxomil-HCTZ; Take 1 tablet by mouth daily.  Dispense: 100 tablet; Refill: 3 -     NIFEdipine  ER; Take 1 tablet (60 mg total) by mouth 2 (two) times daily.  Dispense: 200 tablet; Refill: 0  Eczema, unspecified type -     Dupixent ; Inject 300 mg into the skin every 14 (fourteen) days. Starting at day 15 for maintenance.  Dispense: 12 mL; Refill: 3  High cholesterol -     Atorvastatin  Calcium ; TAKE 1 TABLET BY MOUTH  DAILY FOR CHOLESTEROL  Dispense: 100 tablet; Refill: 0  Immunization due -     Varicella-zoster vaccine IM  Other orders -     Accu-Chek Guide Me; Use to monitor glucose fasting and postprandial (BID)  Dispense: 1 kit; Refill: PRN     Follow-up: Return in about 3 months (around 02/21/2024).  Butler Der, M.D.

## 2023-11-22 LAB — CBC WITH DIFFERENTIAL/PLATELET
Basophils Absolute: 0.1 x10E3/uL (ref 0.0–0.2)
Basos: 1 %
EOS (ABSOLUTE): 0.2 x10E3/uL (ref 0.0–0.4)
Eos: 2 %
Hematocrit: 48.1 % (ref 37.5–51.0)
Hemoglobin: 16.8 g/dL (ref 13.0–17.7)
Immature Grans (Abs): 0 x10E3/uL (ref 0.0–0.1)
Immature Granulocytes: 0 %
Lymphocytes Absolute: 1.4 x10E3/uL (ref 0.7–3.1)
Lymphs: 14 %
MCH: 35.1 pg — ABNORMAL HIGH (ref 26.6–33.0)
MCHC: 34.9 g/dL (ref 31.5–35.7)
MCV: 100 fL — ABNORMAL HIGH (ref 79–97)
Monocytes Absolute: 1 x10E3/uL — ABNORMAL HIGH (ref 0.1–0.9)
Monocytes: 10 %
Neutrophils Absolute: 7.6 x10E3/uL — ABNORMAL HIGH (ref 1.4–7.0)
Neutrophils: 73 %
Platelets: 217 x10E3/uL (ref 150–450)
RBC: 4.79 x10E6/uL (ref 4.14–5.80)
RDW: 12.3 % (ref 11.6–15.4)
WBC: 10.3 x10E3/uL (ref 3.4–10.8)

## 2023-11-22 LAB — LIPID PANEL
Chol/HDL Ratio: 1.8 ratio (ref 0.0–5.0)
Cholesterol, Total: 109 mg/dL (ref 100–199)
HDL: 61 mg/dL (ref >39)
LDL Chol Calc (NIH): 36 mg/dL (ref 0–99)
Triglycerides: 48 mg/dL (ref 0–149)
VLDL Cholesterol Cal: 12 mg/dL (ref 5–40)

## 2023-11-22 LAB — CMP14+EGFR
ALT: 34 IU/L (ref 0–44)
AST: 33 IU/L (ref 0–40)
Albumin: 4.9 g/dL (ref 3.9–4.9)
Alkaline Phosphatase: 91 IU/L (ref 44–121)
BUN/Creatinine Ratio: 11 (ref 10–24)
BUN: 11 mg/dL (ref 8–27)
Bilirubin Total: 0.8 mg/dL (ref 0.0–1.2)
CO2: 17 mmol/L — ABNORMAL LOW (ref 20–29)
Calcium: 9.7 mg/dL (ref 8.6–10.2)
Chloride: 92 mmol/L — ABNORMAL LOW (ref 96–106)
Creatinine, Ser: 0.98 mg/dL (ref 0.76–1.27)
Globulin, Total: 2.8 g/dL (ref 1.5–4.5)
Glucose: 128 mg/dL — ABNORMAL HIGH (ref 70–99)
Potassium: 4.3 mmol/L (ref 3.5–5.2)
Sodium: 129 mmol/L — ABNORMAL LOW (ref 134–144)
Total Protein: 7.7 g/dL (ref 6.0–8.5)
eGFR: 86 mL/min/1.73 (ref >59)

## 2023-11-22 LAB — MICROALBUMIN / CREATININE URINE RATIO
Creatinine, Urine: 61.9 mg/dL
Microalb/Creat Ratio: 395 mg/g{creat} — ABNORMAL HIGH (ref 0–29)
Microalbumin, Urine: 244.5 ug/mL

## 2023-11-22 LAB — TSH+FREE T4
Free T4: 1.55 ng/dL (ref 0.82–1.77)
TSH: 3.47 u[IU]/mL (ref 0.450–4.500)

## 2023-11-24 ENCOUNTER — Encounter: Payer: Self-pay | Admitting: Family Medicine

## 2023-12-04 ENCOUNTER — Ambulatory Visit (INDEPENDENT_AMBULATORY_CARE_PROVIDER_SITE_OTHER): Payer: Medicare Other

## 2023-12-04 VITALS — BP 112/61 | HR 76 | Ht 68.0 in | Wt 208.0 lb

## 2023-12-04 DIAGNOSIS — Z0000 Encounter for general adult medical examination without abnormal findings: Secondary | ICD-10-CM

## 2023-12-04 NOTE — Patient Instructions (Signed)
 [name] , Thank you for taking time out of your busy schedule to complete your Annual Wellness Visit with me. I enjoyed our conversation and look forward to speaking with you again next year. I, as well as your care team,  appreciate your ongoing commitment to your health goals. Please review the following plan we discussed and let me know if I can assist you in the future. Your Game plan/ To Do List    Follow up Visits: Next Medicare AWV with our clinical staff: 12/07/24 at 1:50p.m.   Have you seen your provider in the last 6 months (3 months if uncontrolled diabetes)? Yes Next Office Visit with your provider: 04/06/24 at 10:25a.m.  Clinician Recommendations:  Aim for 30 minutes of exercise or brisk walking, 6-8 glasses of water, and 5 servings of fruits and vegetables each day.       This is a list of the screening recommended for you and due dates:  Health Maintenance  Topic Date Due   Medicare Annual Wellness Visit  12/03/2023   COVID-19 Vaccine (4 - 2024-25 season) 12/07/2023*   Zoster (Shingles) Vaccine (2 of 2) 02/21/2024*   Pneumococcal Vaccine for age over 65 (3 of 3 - PCV20 or PCV21) 03/20/2024*   Screening for Lung Cancer  11/20/2024*   Flu Shot  12/13/2023   Complete foot exam   03/20/2024   Hemoglobin A1C  05/23/2024   Eye exam for diabetics  06/11/2024   Yearly kidney function blood test for diabetes  11/20/2024   Yearly kidney health urinalysis for diabetes  11/20/2024   Colon Cancer Screening  08/01/2026   DTaP/Tdap/Td vaccine (4 - Td or Tdap) 01/23/2033   Hepatitis C Screening  Completed   HIV Screening  Completed   Hepatitis B Vaccine  Aged Out   HPV Vaccine  Aged Out   Meningitis B Vaccine  Aged Out  *Topic was postponed. The date shown is not the original due date.    Advanced directives: (Copy Requested) Please bring a copy of your health care power of attorney and living will to the office to be added to your chart at your convenience. You can mail to [facility] [address] [place], [place] [address] or email to [email or web address] Advance Care Planning is important because it:  [x]  Makes sure you receive the medical care that is consistent with your values, goals, and preferences  [x]  It provides guidance to your family and loved ones and reduces their decisional burden about whether or not they are making the right decisions based on your wishes.  Follow the link provided in your after visit summary or read over the paperwork we have mailed to you to help you started getting your Advance Directives in place. If you need assistance in completing these, please reach out to us  so that we can help you!  See attachments for Preventive Care and Fall Prevention Tips.

## 2023-12-04 NOTE — Progress Notes (Signed)
 Subjective:   Frank Rosales is a 68 y.o. who presents for a Medicare Wellness preventive visit.  As a reminder, Annual Wellness Visits don't include a physical exam, and some assessments may be limited, especially if this visit is performed virtually. We may recommend an in-person follow-up visit with your provider if needed.  Visit Complete: Virtual I connected with  Frank Rosales on 12/04/23 by a audio enabled telemedicine application and verified that I am speaking with the correct person using two identifiers.  Patient Location: Home  Provider Location: Home Office  I discussed the limitations of evaluation and management by telemedicine. The patient expressed understanding and agreed to proceed.  Vital Signs: Because this visit was a virtual/telehealth visit, some criteria may be missing or patient reported. Any vitals not documented were not able to be obtained and vitals that have been documented are patient reported.  VideoDeclined- This patient declined Librarian, academic. Therefore the visit was completed with audio only.  Persons Participating in Visit: Patient.  AWV Questionnaire: No: Patient Medicare AWV questionnaire was not completed prior to this visit.  Cardiac Risk Factors include: advanced age (>17men, >5 women);diabetes mellitus;dyslipidemia;hypertension;male gender;obesity (BMI >30kg/m2);smoking/ tobacco exposure     Objective:    Today's Vitals   12/04/23 1347  BP: 112/61  Pulse: 76  Weight: 208 lb (94.3 kg)  Height: 5' 8 (1.727 m)   Body mass index is 31.63 kg/m.     12/04/2023    1:55 PM 08/01/2023    2:43 PM 12/03/2022    1:53 PM 08/22/2022    6:18 AM 11/29/2021    9:55 AM 11/28/2020   11:31 AM 07/21/2019    9:39 AM  Advanced Directives  Does Patient Have a Medical Advance Directive? Yes Yes Yes Yes Yes Yes Yes  Type of Estate agent of [place];Living will Healthcare Power of [place];Living  will Healthcare Power of [place];Living will Healthcare Power of [place];Living will Healthcare Power of [place];Living will Healthcare Power of [place];Living will Living will;Healthcare Power of Attorney  Does patient want to make changes to medical advance directive?       No - Patient declined  Copy of Healthcare Power of Attorney in Chart? No - copy requested Yes - validated most recent copy scanned in chart (See row information) No - copy requested No - copy requested No - copy requested No - copy requested     Current Medications (verified) Outpatient Encounter Medications as of 12/04/2023  Medication Sig   albuterol  (PROVENTIL ) (2.5 MG/3ML) 0.083% nebulizer solution Take 3 mLs (2.5 mg total) by nebulization every 4 (four) hours as needed for wheezing or shortness of breath.   albuterol  (VENTOLIN  HFA) 108 (90 Base) MCG/ACT inhaler USE 2 INHALATIONS BY MOUTH EVERY 6 HOURS   Ascorbic Acid (VITAMIN C) 500 MG CAPS Take 500 mg by mouth daily.   aspirin  EC 81 MG tablet Take 81 mg by mouth daily. Swallow whole.   atorvastatin  (LIPITOR) 40 MG tablet TAKE 1 TABLET BY MOUTH  DAILY FOR CHOLESTEROL   bisoprolol  (ZEBETA ) 5 MG tablet TAKE ONE-HALF TABLET BY MOUTH  DAILY   Blood Glucose Monitoring Suppl (ACCU-CHEK GUIDE ME) w/Device KIT Use to monitor glucose fasting and postprandial (BID)   budeson-glycopyrrolate -formoterol  (BREZTRI  AEROSPHERE) 160-9-4.8 MCG/ACT AERO inhaler Inhale 2 puffs into the lungs 2 (two) times daily.   Cholecalciferol  (VITAMIN D ) 125 MCG (5000 UT) CAPS Take 5,000 Units by mouth daily.   cyanocobalamin  (VITAMIN B12) 1000 MCG tablet Take  1,000 mcg by mouth daily.   dapagliflozin  propanediol (FARXIGA ) 10 MG TABS tablet Take 1 tablet (10 mg total) by mouth daily before breakfast.   diphenhydrAMINE  HCl, Sleep, 25 MG CAPS Take by mouth at bedtime.   dupilumab  (DUPIXENT ) 300 MG/2ML prefilled syringe Inject 300 mg into the skin every 14 (fourteen) days. Starting at day 15 for  maintenance.   famotidine  (PEPCID ) 20 MG tablet Take 1 tablet (20 mg total) by mouth 2 (two) times daily.   folic acid  (FOLVITE ) 1 MG tablet Take 1 tablet (1 mg total) by mouth daily. (Patient taking differently: Take 1 mg by mouth daily. 400 mcg)   Krill Oil 500 MG CAPS Take 500 mg by mouth daily.   levothyroxine  (SYNTHROID ) 75 MCG tablet Take 1 tablet (75 mcg total) by mouth daily before breakfast.   metFORMIN  (GLUCOPHAGE -XR) 750 MG 24 hr tablet Take 2 tablets (1,500 mg total) by mouth daily with breakfast.   NIFEdipine  (ADALAT  CC) 60 MG 24 hr tablet Take 1 tablet (60 mg total) by mouth 2 (two) times daily.   olmesartan -hydrochlorothiazide  (BENICAR  HCT) 40-25 MG tablet Take 1 tablet by mouth daily.   tamsulosin  (FLOMAX ) 0.4 MG CAPS capsule TAKE 2 CAPSULES BY MOUTH AT  BEDTIME FOR URINE FLOW AND  PROSTATE   Facility-Administered Encounter Medications as of 12/04/2023  Medication   sodium chloride  flush (NS) 0.9 % injection 3 mL    Allergies (verified) Patient has no known allergies.   History: Past Medical History:  Diagnosis Date   Anxiety    Arthritis    neck; lower back; right hip; right knee (10/18/2014)   Asthma    Atypical nevus 03/27/2011   Mid Back-Severe (Exc)   Atypical nevus 01/20/2007   [facility] Fl[facility])   Cataract    Colon polyps    COPD (chronic obstructive pulmonary disease) (HCC)    Diabetes mellitus without complication (HCC)    Emphysema of lung (HCC)    GERD (gastroesophageal reflux disease)    High blood pressure    High cholesterol    Hilar adenopathy    Hypothyroidism    Multiple rib fractures 08/2016   left side   Nodular basal cell carcinoma (BCC) 02/18/2020   Left Temporal Scalp   Nodular basal cell carcinoma (BCC) 07/04/2021   Left Zygomatic Area   Nodular basal cell carcinoma (BCC) 07/04/2021   Left Scaphoid Fossa (curet and 5FU)   Osteoporosis    SCCA (squamous cell carcinoma) of skin 02/18/2020   Left Nasal Sidewall  (well diff)   SCCA (squamous cell carcinoma) of skin 02/18/2020   Right Temple (well diff)   SCCA (squamous cell carcinoma) of skin 02/18/2020   Left [place] (well diff)   Squamous cell carcinoma in situ (SCCIS) 01/09/2016   Right Low Lip Med (Cx3,5FU,LN2) and Top Scalp Sup (Cx3,5FU)   Substance abuse (HCC)    Superficial nodular basal cell carcinoma (BCC) 01/09/2016   Left Sideburn(Exc)   Past Surgical History:  Procedure Laterality Date   COLONOSCOPY WITH PROPOFOL  N/A 08/01/2023   Procedure: COLONOSCOPY WITH PROPOFOL ;  Surgeon: Wilhelmenia Aloha Raddle., MD;  Location: [facility];  Service: Gastroenterology;  Laterality: N/A;   epidural injections     FIXATION KYPHOPLASTY LUMBAR SPINE     L1   KNEE ARTHROSCOPY Right 05/14/2010   LEFT HEART CATH AND CORONARY ANGIOGRAPHY N/A 08/22/2022   Procedure: LEFT HEART CATH AND CORONARY ANGIOGRAPHY;  Surgeon: Dann Candyce RAMAN, MD;  Location: [facility] INVASIVE CV LAB;  Service:  Cardiovascular;  Laterality: N/A;   PLEURAL EFFUSION DRAINAGE Left 08/22/2016   Procedure: DRAINAGE OF HEMOTHORAX;  Surgeon: Elspeth JAYSON Millers, MD;  Location: [facility] OR;  Service: Thoracic;  Laterality: Left;   POLYPECTOMY  08/01/2023   Procedure: POLYPECTOMY;  Surgeon: Wilhelmenia Aloha Raddle., MD;  Location: [facility];  Service: Gastroenterology;;   RIB PLATING Left 08/22/2016   Procedure: RIB PLATING;  Surgeon: Elspeth JAYSON Millers, MD;  Location: [facility] OR;  Service: Thoracic;  Laterality: Left;   VIDEO ASSISTED THORACOSCOPY Left 08/22/2016   Procedure: VIDEO ASSISTED THORACOSCOPY;  Surgeon: Elspeth JAYSON Millers, MD;  Location: [facility] OR;  Service: Thoracic;  Laterality: Left;   WISDOM TOOTH EXTRACTION     Family History  Problem Relation Age of Onset   Heart failure Mother    Diabetes Mother    Emphysema Father    Lung cancer Father    Heart disease Brother    Esophageal cancer Maternal Uncle    Colon polyps Neg Hx    Colon cancer Neg Hx    Stomach cancer Neg Hx     Rectal cancer Neg Hx    Social History   Socioeconomic History   Marital status: Married    Spouse name: Olam   Number of children: 1   Years of education: Not on file   Highest education level: Associate degree: occupational, Scientist, product/process development, or vocational program  Occupational History   Occupation: Patent attorney: [employer]    Comment: retired  Tobacco Use   Smoking status: Former    Current packs/day: 0.00    Average packs/day: 1.5 packs/day for 45.0 years (67.5 ttl pk-yrs)    Types: Cigarettes    Start date: 11/11/1972    Quit date: 11/11/2017    Years since quitting: 6.0    Passive exposure: Never   Smokeless tobacco: Never   Tobacco comments:        Vaping Use   Vaping status: Never Used  Substance and Sexual Activity   Alcohol use: Yes    Alcohol/week: 18.0 standard drinks of alcohol    Types: 18 Cans of beer per week    Comment: 11/28/20 - trying to cut back/quit - down to 2-3 per day   Drug use: No   Sexual activity: Not Currently  Other Topics Concern   Not on file  Social History Narrative   Disabled      Lives with wife      [employer]      Asbestosis, COPD - chronic SOB      Alcoholic - trying to quit; He did quit smoking 2019   Social Drivers of Health   Financial Resource Strain: Low Risk  (12/04/2023)   Overall Financial Resource Strain (CARDIA)    Difficulty of Paying Living Expenses: Not hard at all  Food Insecurity: No Food Insecurity (12/04/2023)   Hunger Vital Sign    Worried About Running Out of Food in the Last Year: Never true    Ran Out of Food in the Last Year: Never true  Transportation Needs: No Transportation Needs (12/04/2023)   PRAPARE - Administrator, Civil Service (Medical): No    Lack of Transportation (Non-Medical): No  Physical Activity: Inactive (12/04/2023)   Exercise Vital Sign    Days of Exercise per Week: 0 days    Minutes of Exercise per Session: 0 min  Stress: No Stress Concern Present (12/04/2023)    [employer] of Occupational Health - Occupational Stress Questionnaire  Feeling of Stress: Not at all  Social Connections: Socially Isolated (12/04/2023)   Social Connection and Isolation Panel    Frequency of Communication with Friends and Family: Once a week    Frequency of Social Gatherings with Friends and Family: Once a week    Attends Religious Services: Never    Database administrator or Organizations: No    Attends Engineer, structural: Never    Marital Status: Married    Tobacco Counseling Counseling given: Yes Tobacco comments:       Clinical Intake:  Pre-visit preparation completed: Yes  Pain : No/denies pain     BMI - recorded: 31.63 Nutritional Status: BMI > 30  Obese Nutritional Risks: None Diabetes: Yes CBG done?: No  Lab Results  Component Value Date   HGBA1C 5.9 (H) 11/21/2023   HGBA1C 5.5 08/20/2023   HGBA1C 5.9 (H) 03/21/2023     How often do you need to have someone help you when you read instructions, pamphlets, or other written materials from your doctor or pharmacy?: 1 - Never  Interpreter Needed?: No  Information entered by :: alia t/cma   Activities of Daily Living     12/04/2023    1:54 PM  In your present state of health, do you have any difficulty performing the following activities:  Hearing? 0  Vision? 0  Difficulty concentrating or making decisions? 0  Walking or climbing stairs? 0  Dressing or bathing? 0  Doing errands, shopping? 0  Preparing Food and eating ? N  Using the Toilet? N  In the past six months, have you accidently leaked urine? N  Do you have problems with loss of bowel control? N  Managing your Medications? N  Managing your Finances? N  Housekeeping or managing your Housekeeping? N    Patient Care Team: Zollie Lowers, MD as PCP - General (Family Medicine) Mallipeddi, Diannah SQUIBB, MD as PCP - Cardiology (Cardiology) Darlean Frank NOVAK, MD as Consulting Physician (Pulmonary Disease) Livingston Rigg, MD (Inactive) as Consulting Physician (Dermatology) [facility] Billee Mliss BIRCH, RPH as Pharmacist (Family Medicine) Ernie Cough, MD as Consulting Physician (Orthopedic Surgery) Miriam Norris, NP as Nurse Practitioner (Cardiology)  I have updated your Care Teams any recent Medical Services you may have received from other providers in the past year.     Assessment:   This is a routine wellness examination for Khian.  Hearing/Vision screen Hearing Screening - Comments:: Pt denies hearing dif Vision Screening - Comments:: Pt denies vision dif/Dr Groat/last of 1/25   Goals Addressed   None    Depression Screen     12/04/2023    2:01 PM 03/21/2023    9:43 AM 12/12/2022   10:57 AM 12/03/2022    1:51 PM 09/06/2022   11:19 AM 08/06/2022    3:50 PM 06/13/2022    9:11 AM  PHQ 2/9 Scores  PHQ - 2 Score 0 0 0 0 0 0 0  PHQ- 9 Score 3          Fall Risk     12/04/2023    1:48 PM 06/04/2023   10:14 AM 03/21/2023    9:43 AM 12/12/2022   10:57 AM 12/03/2022    1:49 PM  Fall Risk   Falls in the past year? 0 0 0 0 0  Number falls in past yr: 0    0  Injury with Fall? 0    0  Risk for fall due to : No Fall  Risks    No Fall Risks  Follow up Falls evaluation completed    Falls prevention discussed    MEDICARE RISK AT HOME:  Medicare Risk at Home Any stairs in or around the home?: Yes If so, are there any without handrails?: Yes Home free of loose throw rugs in walkways, pet beds, electrical cords, etc?: Yes Adequate lighting in your home to reduce risk of falls?: Yes Life alert?: No Use of a cane, walker or w/c?: No Grab bars in the bathroom?: Yes Shower chair or bench in shower?: Yes Elevated toilet seat or a handicapped toilet?: Yes  TIMED UP AND GO:  Was the test performed?  no  Cognitive Function: 6CIT completed    07/18/2018   10:56 AM  MMSE - Mini Mental State Exam  Orientation to time 5  Orientation to Place 5  Registration 3  Attention/  Calculation 5  Recall 3  Language- name 2 objects 2  Language- repeat 1  Language- follow 3 step command 3  Language- read & follow direction 1  Write a sentence 1  Copy design 0  Total score 29        12/04/2023    1:58 PM 12/03/2022    1:53 PM 11/29/2021    9:57 AM 07/21/2019    9:42 AM  6CIT Screen  What Year? 0 points 0 points 0 points 0 points  What month? 0 points 0 points 0 points   What time? 0 points 0 points 0 points 0 points  Count back from 20 0 points 0 points 0 points 0 points  Months in reverse 0 points 0 points 0 points 0 points  Repeat phrase 0 points 0 points 0 points 0 points  Total Score 0 points 0 points 0 points     Immunizations Immunization History  Administered Date(s) Administered   Influenza Split 02/12/2012, 02/11/2013   Influenza Whole 03/12/2011   Influenza, Seasonal, Injecte, Preservative Fre 03/21/2023   Influenza,inj,Quad PF,6+ Mos 02/21/2015, 03/07/2016, 03/06/2017, 02/20/2018, 03/10/2019, 02/02/2020, 03/01/2021, 03/09/2022   Moderna Covid-19 Vaccine Bivalent Booster 34yrs & up 03/28/2021   Moderna SARS-COV2 Booster Vaccination 04/19/2020, 10/18/2020   Moderna Sars-Covid-2 Vaccination 08/05/2019, 08/31/2019   Pneumococcal Conjugate-13 02/21/2015   Pneumococcal Polysaccharide-23 04/14/2013   Td 05/14/2002   Tdap 02/06/2013, 01/24/2023   Zoster Recombinant(Shingrix ) 11/21/2023    Screening Tests Health Maintenance  Topic Date Due   Medicare Annual Wellness (AWV)  12/03/2023   COVID-19 Vaccine (4 - 2024-25 season) 12/07/2023 (Originally 01/13/2023)   Zoster Vaccines- Shingrix  (2 of 2) 02/21/2024 (Originally 01/16/2024)   Pneumococcal Vaccine: 50+ Years (3 of 3 - PCV20 or PCV21) 03/20/2024 (Originally 02/21/2020)   Lung Cancer Screening  11/20/2024 (Originally 01/02/2023)   INFLUENZA VACCINE  12/13/2023   FOOT EXAM  03/20/2024   HEMOGLOBIN A1C  05/23/2024   OPHTHALMOLOGY EXAM  06/11/2024   Diabetic kidney evaluation - eGFR measurement   11/20/2024   Diabetic kidney evaluation - Urine ACR  11/20/2024   Colonoscopy  08/01/2026   DTaP/Tdap/Td (4 - Td or Tdap) 01/23/2033   Hepatitis C Screening  Completed   HIV Screening  Completed   Hepatitis B Vaccines  Aged Out   HPV VACCINES  Aged Out   Meningococcal B Vaccine  Aged Out    Health Maintenance  Health Maintenance Due  Topic Date Due   Medicare Annual Wellness (AWV)  12/03/2023   Health Maintenance Items Addressed: See Nurse Notes at the end of this note  Additional Screening:  Vision  Screening: Recommended annual ophthalmology exams for early detection of glaucoma and other disorders of the eye. Would you like a referral to an eye doctor? No    Dental Screening: Recommended annual dental exams for proper oral hygiene  Community Resource Referral / Chronic Care Management: CRR required this visit?  No   CCM required this visit?  No   Plan:    I have personally reviewed and noted the following in the patient's chart:   Medical and social history Use of alcohol, tobacco or illicit drugs  Current medications and supplements including opioid prescriptions. Patient is not currently taking opioid prescriptions. Functional ability and status Nutritional status Physical activity Advanced directives List of other physicians Hospitalizations, surgeries, and ER visits in previous 12 months Vitals Screenings to include cognitive, depression, and falls Referrals and appointments  In addition, I have reviewed and discussed with patient certain preventive protocols, quality metrics, and best practice recommendations. A written personalized care plan for preventive services as well as general preventive health recommendations were provided to patient.   Ozie Ned, CMA   12/04/2023   After Visit Summary: (MyChart) Due to this being a telephonic visit, the after visit summary with patients personalized plan was offered to patient via MyChart   Notes: Nothing  significant to report at this time.

## 2023-12-16 ENCOUNTER — Telehealth: Payer: Self-pay | Admitting: Internal Medicine

## 2023-12-16 NOTE — Telephone Encounter (Signed)
*  STAT* If patient is at the pharmacy, call can be transferred to refill team.   1. Which medications need to be refilled? (please list name of each medication and dose if known)   bisoprolol  (ZEBETA ) 5 MG tablet   2. Would you like to learn more about the convenience, safety, & potential cost savings by using the [facility]?   3. Are you open to using the [facility] (Type [facility]. ).  4. Which pharmacy/location (including street and city if local pharmacy) is medication to be sent to?  [facility] - [place], [place] - [address]   5. Do they need a 30 day or 90 day supply?   90 day  Patient stated he has 3 days left of this medication.

## 2023-12-16 NOTE — Telephone Encounter (Signed)
 Additional:  Patient requests a text sent to confirm refill sent to [facility].

## 2023-12-18 MED ORDER — BISOPROLOL FUMARATE 5 MG PO TABS: 2.5000 mg | ORAL_TABLET | Freq: Every day | ORAL | 2 refills | Status: DC

## 2023-12-18 NOTE — Telephone Encounter (Signed)
 RX sent in on 12/17/23.

## 2024-01-11 ENCOUNTER — Other Ambulatory Visit: Payer: Self-pay | Admitting: Family Medicine

## 2024-02-11 ENCOUNTER — Other Ambulatory Visit: Payer: Self-pay | Admitting: *Deleted

## 2024-02-11 MED ORDER — ACCU-CHEK GUIDE TEST VI STRP: ORAL_STRIP | 3 refills | Status: AC

## 2024-02-11 MED ORDER — ACCU-CHEK GUIDE ME W/DEVICE KIT: PACK | 0 refills | Status: AC

## 2024-02-11 MED ORDER — ACCU-CHEK SOFTCLIX LANCETS MISC: 3 refills | Status: AC

## 2024-02-11 NOTE — Telephone Encounter (Signed)
 Fax from [facility] One touch lancets & test strips no longer covered. Request for new meter, lancets and test strips, directions copied from last order for One touch and sent in

## 2024-02-14 ENCOUNTER — Telehealth: Payer: Self-pay

## 2024-02-14 NOTE — Telephone Encounter (Signed)
   Has appt 03/17/24.  Spoke with patient. He received renewal letter in the mail but says no further action was required from him. Also says he isnt well with technology and doesn't feel comfortable using the text system.  Patient says he may come by office before his scheduled appt for flu shot, but in the meantime would like me to go ahead and mail the application for him to sign and he will send it back.

## 2024-02-18 NOTE — Telephone Encounter (Signed)
 Application placed in mail 02/18/24

## 2024-02-28 ENCOUNTER — Ambulatory Visit (INDEPENDENT_AMBULATORY_CARE_PROVIDER_SITE_OTHER): Admitting: *Deleted

## 2024-02-28 DIAGNOSIS — Z23 Encounter for immunization: Secondary | ICD-10-CM

## 2024-03-02 ENCOUNTER — Other Ambulatory Visit: Payer: Self-pay | Admitting: Internal Medicine

## 2024-03-02 ENCOUNTER — Other Ambulatory Visit: Payer: Self-pay | Admitting: Family Medicine

## 2024-03-02 DIAGNOSIS — E039 Hypothyroidism, unspecified: Secondary | ICD-10-CM

## 2024-03-02 DIAGNOSIS — E7800 Pure hypercholesterolemia, unspecified: Secondary | ICD-10-CM

## 2024-03-02 DIAGNOSIS — J449 Chronic obstructive pulmonary disease, unspecified: Secondary | ICD-10-CM

## 2024-03-02 DIAGNOSIS — E119 Type 2 diabetes mellitus without complications: Secondary | ICD-10-CM

## 2024-03-10 ENCOUNTER — Telehealth: Payer: Self-pay | Admitting: Pharmacist

## 2024-03-10 DIAGNOSIS — E119 Type 2 diabetes mellitus without complications: Secondary | ICD-10-CM

## 2024-03-10 DIAGNOSIS — J449 Chronic obstructive pulmonary disease, unspecified: Secondary | ICD-10-CM

## 2024-03-10 MED ORDER — DAPAGLIFLOZIN PROPANEDIOL 10 MG PO TABS: 10.0000 mg | ORAL_TABLET | Freq: Every day | ORAL | 3 refills | Status: AC

## 2024-03-10 MED ORDER — BREZTRI AEROSPHERE 160-9-4.8 MCG/ACT IN AERO
2.0000 | INHALATION_SPRAY | Freq: Two times a day (BID) | RESPIRATORY_TRACT | 3 refills | Status: AC
Start: 2024-03-10 — End: ?

## 2024-03-10 NOTE — Telephone Encounter (Signed)
 [place] for farxiga  for 2025-26 Assisted with refills/renewal process Medication will ship to patient's home RX escribed to [facility] with 3 refills

## 2024-03-10 NOTE — Telephone Encounter (Signed)
 Rec'd completed patient portion.   Faxed to [place].   Please send new RX's for Breztri  and Farxiga  to [facility] pharmacy for patients re-enrollment. Thanks!

## 2024-03-11 NOTE — Telephone Encounter (Signed)
 PAP: Patient assistance RE-ENROLLMENT application for Breztri  and Farxiga  has been approved by PAP Companies: [place] from 05/14/24 to 05/13/25.   Medication should be delivered to patients home.   For further shipping updates, please contact [employer] ([place]) at [phone].   Patient ID is: [id number]

## 2024-03-17 ENCOUNTER — Ambulatory Visit: Payer: Self-pay | Admitting: Family Medicine

## 2024-03-17 ENCOUNTER — Encounter: Payer: Self-pay | Admitting: Family Medicine

## 2024-03-17 VITALS — BP 120/74 | HR 75 | Temp 98.2°F | Ht 68.0 in | Wt 210.0 lb

## 2024-03-17 DIAGNOSIS — J449 Chronic obstructive pulmonary disease, unspecified: Secondary | ICD-10-CM | POA: Diagnosis not present

## 2024-03-17 DIAGNOSIS — E1140 Type 2 diabetes mellitus with diabetic neuropathy, unspecified: Secondary | ICD-10-CM | POA: Diagnosis not present

## 2024-03-17 DIAGNOSIS — I10 Essential (primary) hypertension: Secondary | ICD-10-CM

## 2024-03-17 DIAGNOSIS — E7800 Pure hypercholesterolemia, unspecified: Secondary | ICD-10-CM

## 2024-03-17 DIAGNOSIS — Z23 Encounter for immunization: Secondary | ICD-10-CM

## 2024-03-17 DIAGNOSIS — E119 Type 2 diabetes mellitus without complications: Secondary | ICD-10-CM | POA: Insufficient documentation

## 2024-03-17 DIAGNOSIS — E039 Hypothyroidism, unspecified: Secondary | ICD-10-CM

## 2024-03-17 LAB — BAYER DCA HB A1C WAIVED: HB A1C (BAYER DCA - WAIVED): 5.7 % — ABNORMAL HIGH (ref 4.8–5.6)

## 2024-03-17 MED ORDER — LEVOTHYROXINE SODIUM 75 MCG PO TABS: 75.0000 ug | ORAL_TABLET | Freq: Every day | ORAL | 1 refills | Status: DC

## 2024-03-17 MED ORDER — TAMSULOSIN HCL 0.4 MG PO CAPS: 0.4000 mg | ORAL_CAPSULE | Freq: Every evening | ORAL | 0 refills | Status: AC

## 2024-03-17 MED ORDER — ATORVASTATIN CALCIUM 40 MG PO TABS: ORAL_TABLET | ORAL | 0 refills | Status: DC

## 2024-03-17 MED ORDER — METFORMIN HCL ER 750 MG PO TB24: 1500.0000 mg | ORAL_TABLET | Freq: Every day | ORAL | 0 refills | Status: DC

## 2024-03-17 MED ORDER — NIFEDIPINE ER 60 MG PO TB24: 60.0000 mg | ORAL_TABLET | Freq: Two times a day (BID) | ORAL | 0 refills | Status: AC

## 2024-03-17 NOTE — Progress Notes (Signed)
 Subjective:  Patient ID: Frank Rosales, male    DOB: [birth year]  Age: 68 y.o. MRN: [id number]  CC: Diabetes   HPI  Discussed the use of AI scribe software for clinical note transcription with the patient, who gave verbal consent to proceed.  History of Present Illness Frank Rosales is a 68 year old male with COPD and diabetes who presents for a follow-up visit.  He describes his breathing as stable but experiences significant fatigue with minimal exertion. He is under the care of a lung specialist, with biannual visits and the next appointment scheduled for December. He participates in a lung cancer screening program due to asbestos exposure at work, which includes an annual CT scan and a breathing test. He describes his breathing issues as feeling like 'half drowning' and notes that his lung function has declined since his working years.  He has a history of a blocked artery in his heart and sometimes experiences fatigue without dyspnea, which he differentiates from his breathing issues. No chest pain, heaviness, or tightness during these episodes. 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  Regarding his thyroid , he states his energy is 'okay overall,' but it is hard to separate from his breathing issues. He notes that if it is an energy issue, he feels like he cannot keep going, whereas a breathing issue feels more like 'half drowning.'  He also has neuropathy, describing it as feeling like 'walking on pins and needles' or numbness, and sometimes his feet do not respond as they used to. No swelling.     in for follow-up of elevated cholesterol. Doing well without complaints on current medication. Denies side effects of statin including myalgia and arthralgia and nausea. Currently no chest pain, shortness of breath or other cardiovascular related symptoms noted.       12/04/2023    2:01 PM 03/21/2023     9:43 AM 12/12/2022   10:57 AM  Depression screen PHQ 2/9  Decreased Interest 0 0 0  Down, Depressed, Hopeless 0 0 0  PHQ - 2 Score 0 0 0  Altered sleeping 0    Tired, decreased energy 3    Change in appetite 0    Feeling bad or failure about yourself  0    Trouble concentrating 0    Moving slowly or fidgety/restless 0    Suicidal thoughts 0    PHQ-9 Score 3    Difficult doing work/chores Not difficult at all      History Frank Rosales has a past medical history of Anxiety, Arthritis, Asthma, Atypical nevus (03/27/2011), Atypical nevus (01/20/2007), Cataract, Colon polyps, COPD (chronic obstructive pulmonary disease) (HCC), Diabetes mellitus without complication (HCC), Emphysema of lung (HCC), GERD (gastroesophageal reflux disease), High blood pressure, High cholesterol, Hilar adenopathy, Hypothyroidism, Multiple rib fractures (08/2016), Nodular basal cell carcinoma (BCC) (02/18/2020), Nodular basal cell carcinoma (BCC) (07/04/2021), Nodular basal cell carcinoma (BCC) (07/04/2021), Osteoporosis, SCCA (squamous cell carcinoma) of skin (02/18/2020), SCCA (squamous cell carcinoma) of skin (02/18/2020), SCCA (squamous cell carcinoma) of skin (02/18/2020), Squamous cell carcinoma in situ (SCCIS) (01/09/2016), Substance abuse (HCC), and Superficial nodular basal cell carcinoma (BCC) (01/09/2016).   He has a past surgical history that includes Knee arthroscopy (Right, 05/14/2010); Wisdom tooth extraction; epidural injections; Fixation kyphoplasty lumbar spine; Video assisted thoracoscopy (Left, 08/22/2016); Pleural effusion drainage (Left, 08/22/2016); Rib plating (Left, 08/22/2016); LEFT HEART CATH AND CORONARY ANGIOGRAPHY (N/A, 08/22/2022); Colonoscopy  with propofol  (N/A, 08/01/2023); and polypectomy (08/01/2023).   His family history includes Diabetes in his mother; Emphysema in his father; Esophageal cancer in his maternal uncle; Heart disease in his brother; Heart failure in his mother; Lung cancer in  his father.He reports that he quit smoking about 6 years ago. His smoking use included cigarettes. He started smoking about 51 years ago. He has a 67.5 pack-year smoking history. He has never been exposed to tobacco smoke. He has never used smokeless tobacco. He reports current alcohol use of about 18.0 standard drinks of alcohol per week. He reports that he does not use drugs.    ROS Review of Systems  Constitutional: Negative.   HENT: Negative.    Eyes:  Negative for visual disturbance.  Respiratory:  Positive for shortness of breath. Negative for cough and chest tightness.   Cardiovascular:  Negative for chest pain and leg swelling.  Gastrointestinal:  Negative for abdominal pain, diarrhea, nausea and vomiting.  Genitourinary:  Negative for difficulty urinating.  Musculoskeletal:  Negative for arthralgias and myalgias.  Skin:  Negative for rash.  Neurological:  Negative for headaches.  Psychiatric/Behavioral:  Negative for sleep disturbance.     Objective:  BP 120/74   Pulse 75   Temp 98.2 F (36.8 C)   Ht 5' 8 (1.727 m)   Wt 210 lb (95.3 kg)   SpO2 92%   BMI 31.93 kg/m   BP Readings from Last 3 Encounters:  03/17/24 120/74  12/04/23 112/61  11/21/23 112/61    Wt Readings from Last 3 Encounters:  03/17/24 210 lb (95.3 kg)  12/04/23 208 lb (94.3 kg)  11/21/23 208 lb (94.3 kg)     Physical Exam Physical Exam VITALS: BP- 120/74, SaO2- 92% GENERAL: Alert, cooperative, well developed, no acute distress. HEENT: Normocephalic, normal oropharynx, moist mucous membranes. CHEST: Clear to auscultation bilaterally, no wheezes, rhonchi, or crackles. Lungs normal on auscultation. CARDIOVASCULAR: Normal heart rate and rhythm, S1 and S2 normal without murmurs. ABDOMEN: Soft, non-tender, non-distended, without organomegaly, normal bowel sounds. EXTREMITIES: No cyanosis, edema, or swelling. NEUROLOGICAL: Cranial nerves grossly intact, moves all extremities without gross motor or  sensory deficit.   Assessment & Plan:  Type 2 diabetes mellitus with diabetic neuropathy, without long-term current use of insulin (HCC) -     Bayer DCA Hb A1c Waived -     CBC with Differential/Platelet -     Comprehensive metabolic panel with GFR -     Microalbumin / creatinine urine ratio -     metFORMIN  HCl ER; Take 2 tablets (1,500 mg total) by mouth daily with breakfast.  Dispense: 200 tablet; Refill: 0  Hypothyroidism, unspecified type -     Comprehensive metabolic panel with GFR -     TSH + free T4 -     Levothyroxine  Sodium; Take 1 tablet (75 mcg total) by mouth daily before breakfast.  Dispense: 90 tablet; Refill: 1  COPD GOLD III   High cholesterol -     Comprehensive metabolic panel with GFR -     Atorvastatin  Calcium ; TAKE 1 TABLET BY MOUTH  DAILY FOR CHOLESTEROL  Dispense: 100 tablet; Refill: 0  Essential hypertension -     Comprehensive metabolic panel with GFR -     NIFEdipine  ER; Take 1 tablet (60 mg total) by mouth 2 (two) times daily.  Dispense: 200 tablet; Refill: 0  Need for shingles vaccine -     Varicella-zoster vaccine IM  Other orders -     Tamsulosin   HCl; Take 1 capsule (0.4 mg total) by mouth at bedtime.  Dispense: 200 capsule; Refill: 0    Assessment and Plan Assessment & Plan Chronic obstructive pulmonary disease (COPD)   COPD limits physical activity with dyspnea and fatigue, worsened by exertion. Oxygen saturation is 92%, which is acceptable. No chest pain or significant edema. He is enrolled in lung cancer screening due to asbestos exposure. Continue current COPD management plan. Follow up with lung specialist in December. Undergo annual CT scan for lung cancer screening in January.  Type 2 diabetes mellitus without complications   Diabetes is well-controlled with an A1c of 5.7%. Continue current diabetes management plan. Follow up in 3 months.  Essential hypertension   Blood pressure is well-controlled at 120/74 mmHg. Continue current  hypertension management plan.  Hypothyroidism   Energy levels are generally okay; fatigue is likely related to COPD.  Peripheral neuropathy   Symptoms include paresthesia and numbness in the feet.       Follow-up: Return in about 3 months (around 06/17/2024).  Butler Der, M.D.

## 2024-03-18 LAB — COMPREHENSIVE METABOLIC PANEL WITH GFR
ALT: 28 IU/L (ref 0–44)
AST: 29 IU/L (ref 0–40)
Albumin: 4.9 g/dL (ref 3.9–4.9)
Alkaline Phosphatase: 85 IU/L (ref 47–123)
BUN/Creatinine Ratio: 9 — ABNORMAL LOW (ref 10–24)
BUN: 11 mg/dL (ref 8–27)
Bilirubin Total: 1 mg/dL (ref 0.0–1.2)
CO2: 20 mmol/L (ref 20–29)
Calcium: 9.9 mg/dL (ref 8.6–10.2)
Chloride: 90 mmol/L — ABNORMAL LOW (ref 96–106)
Creatinine, Ser: 1.16 mg/dL (ref 0.76–1.27)
Globulin, Total: 2.7 g/dL (ref 1.5–4.5)
Glucose: 119 mg/dL — ABNORMAL HIGH (ref 70–99)
Potassium: 4.2 mmol/L (ref 3.5–5.2)
Sodium: 128 mmol/L — ABNORMAL LOW (ref 134–144)
Total Protein: 7.6 g/dL (ref 6.0–8.5)
eGFR: 69 mL/min/1.73 (ref >59)

## 2024-03-18 LAB — CBC WITH DIFFERENTIAL/PLATELET
Basophils Absolute: 0.1 x10E3/uL (ref 0.0–0.2)
Basos: 1 %
EOS (ABSOLUTE): 0.2 x10E3/uL (ref 0.0–0.4)
Eos: 2 %
Hematocrit: 49.3 % (ref 37.5–51.0)
Hemoglobin: 17.1 g/dL (ref 13.0–17.7)
Immature Grans (Abs): 0 x10E3/uL (ref 0.0–0.1)
Immature Granulocytes: 0 %
Lymphocytes Absolute: 1.3 x10E3/uL (ref 0.7–3.1)
Lymphs: 14 %
MCH: 34.9 pg — ABNORMAL HIGH (ref 26.6–33.0)
MCHC: 34.7 g/dL (ref 31.5–35.7)
MCV: 101 fL — ABNORMAL HIGH (ref 79–97)
Monocytes Absolute: 1 x10E3/uL — ABNORMAL HIGH (ref 0.1–0.9)
Monocytes: 11 %
Neutrophils Absolute: 6.8 x10E3/uL (ref 1.4–7.0)
Neutrophils: 72 %
Platelets: 244 x10E3/uL (ref 150–450)
RBC: 4.9 x10E6/uL (ref 4.14–5.80)
RDW: 12.4 % (ref 11.6–15.4)
WBC: 9.5 x10E3/uL (ref 3.4–10.8)

## 2024-03-18 LAB — MICROALBUMIN / CREATININE URINE RATIO
Creatinine, Urine: 57.9 mg/dL
Microalb/Creat Ratio: 395 mg/g{creat} — ABNORMAL HIGH (ref 0–29)
Microalbumin, Urine: 228.7 ug/mL

## 2024-03-18 LAB — TSH+FREE T4
Free T4: 1.61 ng/dL (ref 0.82–1.77)
TSH: 4.05 u[IU]/mL (ref 0.450–4.500)

## 2024-03-23 LAB — PROTEIN ELECTROPHORESIS
A/G Ratio: 1.1 (ref 0.7–1.7)
Albumin ELP: 3.8 g/dL (ref 2.9–4.4)
Alpha 1: 0.3 g/dL (ref 0.0–0.4)
Alpha 2: 0.8 g/dL (ref 0.4–1.0)
Beta: 1.3 g/dL (ref 0.7–1.3)
Gamma Globulin: 1.2 g/dL (ref 0.4–1.8)
Globulin, Total: 3.6 g/dL (ref 2.2–3.9)
Total Protein: 7.4 g/dL (ref 6.0–8.5)

## 2024-03-23 LAB — SPECIMEN STATUS REPORT

## 2024-04-14 ENCOUNTER — Telehealth: Payer: Self-pay | Admitting: Internal Medicine

## 2024-04-14 NOTE — Telephone Encounter (Signed)
 Pt c/o Shortness Of Breath: STAT if SOB developed within the last 24 hours or pt is noticeably SOB on the phone  1. Are you currently SOB (can you hear that pt is SOB on the phone)? No  2. How long have you been experiencing SOB? 1 week  3. Are you SOB when sitting or when up moving around? Laying down  4. Are you currently experiencing any other symptoms? No

## 2024-04-14 NOTE — Telephone Encounter (Signed)
 Spoke with daughter Edwardo) - states that he sleeps sitting up in recliner on a regular due to COPD but had 2 episodes recently where he could not sleep, anxious & felt like his breathing was worse.  States that she does not live with him so she hasn't actually seen him yet.  Did tell him if happened again to go to ED for evaluation.  States her mom slept in living room last evening & he slept and had a better night last night.  She was unable to give me any info about his weight or if he visibly has swelling.  Says he has not complained of any.   Does see pulm (Wert) on 04/27/2024.  Has not called him yet for these symptoms since he has visit coming up in a few weeks.  He continues to drink alcohol weekly on the regular.  She will go tonight to see him & get more information about how he's feeling & send reply via mychart.  Will retrieve in the morning.

## 2024-04-15 NOTE — Telephone Encounter (Signed)
 Daugher Edwardo) notified & verbalized understanding.

## 2024-04-15 NOTE — Telephone Encounter (Signed)
 Left message to return call

## 2024-04-15 NOTE — Telephone Encounter (Signed)
 Spoke with daughter Edwardo) - she reports weight yesterday morning was 198lb & this morning was 199lb.  No c/o chest pain and no swelling noted to his hands or feet per daughter.  No new respiratory symptoms either.  Has not had any new episodes.

## 2024-04-15 NOTE — Telephone Encounter (Signed)
 Pt daughter calling to speak with Sharman, LPN. She states she was unable to send a fpl group.

## 2024-04-27 ENCOUNTER — Encounter: Payer: Self-pay | Admitting: Internal Medicine

## 2024-04-27 ENCOUNTER — Ambulatory Visit: Admitting: Internal Medicine

## 2024-04-27 VITALS — BP 107/71 | HR 90 | Ht 68.0 in | Wt 204.0 lb

## 2024-04-27 DIAGNOSIS — J449 Chronic obstructive pulmonary disease, unspecified: Secondary | ICD-10-CM

## 2024-04-27 NOTE — Assessment & Plan Note (Addendum)
 Quit smoking 2019 MM Spirometry 11/2017  03/2011 FeV1 49%    Spirometry  02/28/2012  FEV1 45%   - Last day worked = October 13 2014  - PFT's  01/24/2015  FEV1 1.59  (43 % ) ratio 54   p 22 % improvement from saba with DLCO  88 % corrects to 99 % for alv volume s symbicort   - PFTs  12/27/15 1.30 (35%) ratio 35  In midst of a flare- - PFT's  12/21/2016  FEV1 1.40 (39 % ) ratio 50  p 8 % improvement from saba p symbicort  160 x 2  prior to study with DLCO  71/69 % corrects to 85  % for alv volume - PFTS  05/16/17  FEV1  1.32( 37%)  Ratio 37  And DLCO  52% > corrects to 78%  03/25/2017  try bevespi  > preferred symb 160  - 06/13/2018    try adding spiriva  2.5 x 2 each am sample  > no better so did not fill rx  -  rx Dupixent  06/2018 for eczema >>> - 03/31/2019   Walked RA x one lap =  approx 250 ft @ mod pace - stopped due to sob with sats of 92% at the end of the study. - 02/01/2020  After extensive coaching inhaler device,  effectiveness =  90%  - alpha one AT def screen  02/02/20   MM  Level 151  -  09/16/2020   Walked RA   Moderate pace walk total 300 feet feeling short of breath at 200 feet but sats still 98% at end typical of a pink puffer - PFT's  01/01/22   FEV1 1.46(42 % ) ratio 0.42  p 0 % improvement from saba p ? prior to study with DLCO  19/7 (61%)  and FV curve classically concave  - 07/26/2022   Walked on RA   x  2  lap(s) =  approx 300  ft  @ slow/penguin gait   stopped due to sob  with lowest 02 sats 92%    - 04/23/2023  After extensive coaching inhaler device,  effectiveness =  90+  - 06/04/2023 prn zpak     Group E in terms of symptoms/risk so  laba/lama/ICS  therefore appropriate rx at this point >>>  Breztri  (dupixent  for eczema)  and approp SABA prn.  Re SABA :  I spent extra time with pt today reviewing appropriate use of albuterol  for prn use on exertion with the following points: 1) saba is for relief of sob that does not improve by walking a slower pace or resting but rather if the pt does  not improve after trying this first. 2) If the pt is convinced, as many are, that saba helps recover from activity faster then it's easy to tell if this is the case by re-challenging : ie stop, take the inhaler, then p 5 minutes try the exact same activity (intensity of workload) that just caused the symptoms and see if they are substantially diminished or not after saba 3) if there is an activity that reproducibly causes the symptoms, try the saba 15 min before the activity on alternate days   If in fact the saba really does help, then fine to continue to use it prn but advised may need to look closer at the maintenance regimen being used to achieve better control of airways disease with exertion.   F/u can be q 80m - in meantime if losing ground with ex tol consider  adding ohtuvayre          Each maintenance medication was reviewed in detail including emphasizing most importantly the difference between maintenance and prns and under what circumstances the prns are to be triggered using an action plan format where appropriate.  Total time for H and P, chart review, counseling, reviewing hfa/neb  device(s) and generating customized AVS unique to this office visit / same day charting = 30 min

## 2024-04-27 NOTE — Progress Notes (Signed)
 Subjective:   Patient ID: EULALIO REAMY, male    DOB: 1955/12/24    MRN: [id number]    Brief patient profile:  22  yowm/MM quit smoking  11/2017  with h/o exposure to Asbestos  at [employer] ? Into the 80s  still able to walk  flat fine but steps x 3 flights sob with GOLD III copd 2013 prev eval by Dr Brien  / gets annual f/u in [place]    History of Present Illness  02/16/2016  f/u ov/Kenya Kook re:  GOLD III copd /  symbicort  160 2bid / twice weekly saba  Chief Complaint  Patient presents with   Follow-up    Pt states had CT Chest done 12/27/15- ordered by worker's comp for eval of previous asbestos exp. He states he had been doing well until July 2017 developed cough and congestion that lasted 2 months, but starting to improve.   sick July - September 2017 rx with abx/ steroids better since early Sept 2017 but CT was done in middle of this illness and was abnormal but was done for purpose of screening for asbestos  rec Plan A = Automatic = symbicort  160 Take 2 puffs first thing in am and then another 2 puffs about 12 hours later.  Work on inhaler technique:   Only use your albuterol (proair )  as a rescue medication  The key is to stop smoking completely before smoking completely stops you!        09/29/2021  f/u ov/[place] office/Sarabelle Genson re: GOLD 3  maint on breztri /dupixent  and pepcid  each evening   Chief Complaint  Patient presents with   Follow-up    Cough and chest congestion have cleared up   Dyspnea:  shop and back s stopping  Cough: better  Sleeping: ok flat bed / 2 pillows  SABA use: not needing  02: none  Covid status: vax x 5  Lung cancer screening done annually in [place]  Rec My office will be contacting you by phone for referral to ENT for hoarseness > [facility] 01/25/22 dx GERD      06/04/2023  ACUTE  ov/Brook Geraci re: GOLD 3 copd  maint on Breztri  and dupixent   since 06/2018 and just pepcid  20 mg at hs  Chief Complaint  Patient presents with   Acute Visit     Productive cough with a lot of light green mucus x 2 weeks.  Sx some improved today.  Dyspnea:  shop and back s stopping along the way same as baseline   Cough: Productive cough with a lot of light green mucus x 2 weeks > thinning out a bit with mucinex  dm but not sure he has the flutter valve still at home  Sleeping: flat bed  2 pillows s  resp cc  SABA use: none at all  02: none  Rec For nasty mucus > zpak  (refill as needed)  For cough/ congestion >  mucinex  dm (irritating) or Mucinex  D (stuffy nose)  up to maximum of  1200 mg every 12 hours and use the flutter valve as much as you can     10/28/2023  f/u ov/[place] office/Kyrie Bun re: COPD GOLD 3/ rx Breztri  / dupixent  (from derm) and 2 benadryl  hs cc dry mouth  Chief Complaint  Patient presents with   COPD   Follow-up  Dyspnea:  still can get shop to house s stopping/ sits and and rests s inhaler  Cough: not much/ no need for mucinex  / flutter  Sleeping:  bed is flat with 2 pillows s noct or am   resp cc  SABA use: none  02: none  Lung cancer screening: per per [facility] q December  Rec Ok to try breztri  one twice daily to see if helps dry mouth  - other option is symbicort  160  Other option is to reduce benadryl  use  Still strongly recommend you have your teeth evaluated by a dentist  Please schedule a follow up visit in  6 months but call sooner if needed    04/27/2024  f/u ov/[place] office/Haliegh Khurana re: COPD GOLD 3/ rx Breztri  / dupixent  (for eczema)    Chief Complaint  Patient presents with   COPD    [place]   Dyspnea:  no change with albuterol   Cough: none  Sleeping: flat bed 2 pillows s  resp cc  SABA use: none  02: none  Lung cancer screening: per [facility]/[place]    No obvious day to day or daytime variability or assoc excess/ purulent sputum or mucus plugs or hemoptysis or cp or chest tightness, subjective wheeze or overt sinus or hb symptoms.    Also denies any obvious fluctuation of symptoms with weather or  environmental changes or other aggravating or alleviating factors except as outlined above   No unusual exposure hx or h/o childhood pna/ asthma or knowledge of premature birth.  Current Allergies, Complete Past Medical History, Past Surgical History, Family History, and Social History were reviewed in [employer] [facility] record.  ROS  The following are not active complaints unless bolded Hoarseness, sore throat, dysphagia, dental problems, itching, sneezing,  nasal congestion or discharge of excess mucus or purulent secretions, ear ache,   fever, chills, sweats, unintended wt loss or wt gain, classically pleuritic or exertional cp,  orthopnea pnd or arm/hand swelling  or leg swelling, presyncope, palpitations, abdominal pain, anorexia, nausea, vomiting, diarrhea  or change in bowel habits or change in bladder habits, change in stools or change in urine, dysuria, hematuria,  rash, arthralgias, visual complaints, headache, numbness, weakness or ataxia or problems with walking or coordination,  change in mood or  memory.        Current Meds  Medication Sig   Accu-Chek Softclix Lancets lancets CHECK BLOOD SUGAR UP TO 4 TIMES DAILY DX E11.9   albuterol  (PROVENTIL ) (2.5 MG/3ML) 0.083% nebulizer solution Take 3 mLs (2.5 mg total) by nebulization every 4 (four) hours as needed for wheezing or shortness of breath.   albuterol  (VENTOLIN  HFA) 108 (90 Base) MCG/ACT inhaler USE 2 INHALATIONS BY MOUTH EVERY 6 HOURS   Ascorbic Acid (VITAMIN C) 500 MG CAPS Take 500 mg by mouth daily.   aspirin  EC 81 MG tablet Take 81 mg by mouth daily. Swallow whole.   atorvastatin  (LIPITOR) 40 MG tablet TAKE 1 TABLET BY MOUTH  DAILY FOR CHOLESTEROL   bisoprolol  (ZEBETA ) 5 MG tablet Take 0.5 tablets (2.5 mg total) by mouth daily.   Blood Glucose Monitoring Suppl (ACCU-CHEK GUIDE ME) w/Device KIT CHECK BLOOD SUGAR UP TO 4 TIMES DAILY DX E11.9   budesonide -glycopyrrolate -formoterol  (BREZTRI  AEROSPHERE) 160-9-4.8  MCG/ACT AERO inhaler Inhale 2 puffs into the lungs 2 (two) times daily.   Cholecalciferol  (VITAMIN D ) 125 MCG (5000 UT) CAPS Take 5,000 Units by mouth daily.   cyanocobalamin  (VITAMIN B12) 1000 MCG tablet Take 1,000 mcg by mouth daily.   dapagliflozin  propanediol (FARXIGA ) 10 MG TABS tablet Take 1 tablet (10 mg total) by mouth daily before breakfast.   dupilumab  (DUPIXENT ) 300 MG/2ML  prefilled syringe Inject 300 mg into the skin every 14 (fourteen) days. Starting at day 15 for maintenance.   famotidine  (PEPCID ) 20 MG tablet Take 1 tablet (20 mg total) by mouth 2 (two) times daily.   folic acid  (FOLVITE ) 1 MG tablet Take 1 tablet (1 mg total) by mouth daily. (Patient taking differently: Take 1 mg by mouth daily. 400 mcg)   glucose blood (ACCU-CHEK GUIDE TEST) test strip CHECK BLOOD SUGAR UP TO 4 TIMES DAILY DX E11.9   Krill Oil 500 MG CAPS Take 500 mg by mouth daily.   levothyroxine  (SYNTHROID ) 75 MCG tablet Take 1 tablet (75 mcg total) by mouth daily before breakfast.   metFORMIN  (GLUCOPHAGE -XR) 750 MG 24 hr tablet Take 2 tablets (1,500 mg total) by mouth daily with breakfast.   NIFEdipine  (ADALAT  CC) 60 MG 24 hr tablet Take 1 tablet (60 mg total) by mouth 2 (two) times daily.   olmesartan -hydrochlorothiazide  (BENICAR  HCT) 40-25 MG tablet Take 1 tablet by mouth daily.   tamsulosin  (FLOMAX ) 0.4 MG CAPS capsule Take 1 capsule (0.4 mg total) by mouth at bedtime           Objective:   Physical Exam  Wts  04/27/2024  204   10/28/2023    207  06/04/2023    204  04/23/2023  210  01/22/2023    213  07/26/2022    210 04/02/2022  212 09/29/2021    212 04/03/2021  203  09/16/2020      222 03/03/2020  219  02/01/2020    217 10/05/2019    228 03/31/2019  213  01/24/2015    180 > 02/21/2015  180  > 06/16/2015  186  > 02/16/2016  205 > 09/13/2016   185 >  11/07/2016  187 >  12/21/2016  195 > 03/25/2017   200 > 06/25/2017   198  > 12/23/2017   199  > 06/13/2018  200      11/10/14 174 lb (78.926 kg)  11/08/14  176 lb 6.4 oz (80.015 kg)  10/27/14 175 lb (79.379 kg)     Vital signs reviewed  04/27/2024  - Note at rest 02 sats  91% on RA   General appearance:    amb hoarse pleasant wm and   HEENT : Oropharynx  clear/ severe tooth decay noted     NECK :  without  apparent JVD/ palpable Nodes/TM    LUNGS: no acc muscle use,  Mild barrel  contour chest wall with bilateral  Distant bs s audible wheeze and  without cough on insp or exp maneuvers  and mild  Hyperresonant  to  percussion bilaterally     CV:  RRR  no s3 or murmur or increase in P2, and no edema   ABD:  soft and nontender   MS:  Nl gait/ ext warm without deformities Or obvious joint restrictions  calf tenderness, cyanosis or clubbing     SKIN: warm and dry without lesions    NEURO:  alert, approp, nl sensorium with  no motor or cerebellar deficits apparent.            Assessment & Plan:      Assessment & Plan COPD GOLD III   Quit smoking 2019 MM Spirometry 11/2017  03/2011 FeV1 49%    Spirometry  02/28/2012  FEV1 45%   - Last day worked = October 13 2014  - PFT's  01/24/2015  FEV1 1.59  (43 % ) ratio 54   p  22 % improvement from saba with DLCO  88 % corrects to 99 % for alv volume s symbicort   - PFTs  12/27/15 1.30 (35%) ratio 35  In midst of a flare- - PFT's  12/21/2016  FEV1 1.40 (39 % ) ratio 50  p 8 % improvement from saba p symbicort  160 x 2  prior to study with DLCO  71/69 % corrects to 85  % for alv volume - PFTS  05/16/17  FEV1  1.32( 37%)  Ratio 37  And DLCO  52% > corrects to 78%  03/25/2017  try bevespi  > preferred symb 160  - 06/13/2018    try adding spiriva  2.5 x 2 each am sample  > no better so did not fill rx  -  rx Dupixent  06/2018 for eczema >>> - 03/31/2019   Walked RA x one lap =  approx 250 ft @ mod pace - stopped due to sob with sats of 92% at the end of the study. - 02/01/2020  After extensive coaching inhaler device,  effectiveness =  90%  - alpha one AT def screen  02/02/20   MM  Level 151  -  09/16/2020    Walked RA   Moderate pace walk total 300 feet feeling short of breath at 200 feet but sats still 98% at end typical of a pink puffer - PFT's  01/01/22   FEV1 1.46(42 % ) ratio 0.42  p 0 % improvement from saba p ? prior to study with DLCO  19/7 (61%)  and FV curve classically concave  - 07/26/2022   Walked on RA   x  2  lap(s) =  approx 300  ft  @ slow/penguin gait   stopped due to sob  with lowest 02 sats 92%    - 04/23/2023  After extensive coaching inhaler device,  effectiveness =  90+  - 06/04/2023 prn zpak     Group E in terms of symptoms/risk so  laba/lama/ICS  therefore appropriate rx at this point >>>  Breztri  (dupixent  for eczema)  and approp SABA prn.  Re SABA :  I spent extra time with pt today reviewing appropriate use of albuterol  for prn use on exertion with the following points: 1) saba is for relief of sob that does not improve by walking a slower pace or resting but rather if the pt does not improve after trying this first. 2) If the pt is convinced, as many are, that saba helps recover from activity faster then it's easy to tell if this is the case by re-challenging : ie stop, take the inhaler, then p 5 minutes try the exact same activity (intensity of workload) that just caused the symptoms and see if they are substantially diminished or not after saba 3) if there is an activity that reproducibly causes the symptoms, try the saba 15 min before the activity on alternate days   If in fact the saba really does help, then fine to continue to use it prn but advised may need to look closer at the maintenance regimen being used to achieve better control of airways disease with exertion.   F/u can be q 60m - in meantime if losing ground with ex tol consider adding ohtuvayre          Each maintenance medication was reviewed in detail including emphasizing most importantly the difference between maintenance and prns and under what circumstances the prns are to be triggered using an action  plan format where appropriate.  Total time for H and P, chart review, counseling, reviewing hfa/neb  device(s) and generating customized AVS unique to this office visit / same day charting = 30 min            AVS  Patient Instructions  No change in medications   Also  Ok to try albuterol  15 min before an activity (on alternating days with the nebulizer vs nothing)  that you know would usually make you short of breath and see if it makes any difference and if makes none then don't take albuterol  after activity unless you can't catch your breath as this means it's the resting that helps, not the albuterol .      If you feel you  are losing any ground with activity tolerance please call for trial of Ohtuvayre  nebulizer.  Consider recumbent bicycle    Please schedule a follow up visit in 6 months but call sooner if needed    Ozell America, MD 04/27/2024

## 2024-04-27 NOTE — Patient Instructions (Addendum)
 No change in medications   Also  Ok to try albuterol  15 min before an activity (on alternating days with the nebulizer vs nothing)  that you know would usually make you short of breath and see if it makes any difference and if makes none then don't take albuterol  after activity unless you can't catch your breath as this means it's the resting that helps, not the albuterol .      If you feel you  are losing any ground with activity tolerance please call for trial of Ohtuvayre  nebulizer.  Consider recumbent bicycle    Please schedule a follow up visit in 6 months but call sooner if needed

## 2024-05-17 ENCOUNTER — Other Ambulatory Visit: Payer: Self-pay | Admitting: Family Medicine

## 2024-05-17 DIAGNOSIS — E039 Hypothyroidism, unspecified: Secondary | ICD-10-CM

## 2024-05-27 ENCOUNTER — Other Ambulatory Visit: Payer: Self-pay | Admitting: Family Medicine

## 2024-05-27 ENCOUNTER — Other Ambulatory Visit: Payer: Self-pay | Admitting: Internal Medicine

## 2024-05-27 DIAGNOSIS — E1140 Type 2 diabetes mellitus with diabetic neuropathy, unspecified: Secondary | ICD-10-CM

## 2024-05-27 DIAGNOSIS — E7800 Pure hypercholesterolemia, unspecified: Secondary | ICD-10-CM

## 2024-06-17 ENCOUNTER — Ambulatory Visit: Admitting: Family Medicine

## 2024-06-17 ENCOUNTER — Encounter: Payer: Self-pay | Admitting: Family Medicine

## 2024-06-17 ENCOUNTER — Ambulatory Visit: Payer: Self-pay | Admitting: Family Medicine

## 2024-06-17 VITALS — BP 120/79 | HR 71 | Temp 98.1°F | Ht 68.0 in | Wt 202.0 lb

## 2024-06-17 DIAGNOSIS — Z23 Encounter for immunization: Secondary | ICD-10-CM

## 2024-06-17 DIAGNOSIS — E119 Type 2 diabetes mellitus without complications: Secondary | ICD-10-CM

## 2024-06-17 DIAGNOSIS — J449 Chronic obstructive pulmonary disease, unspecified: Secondary | ICD-10-CM

## 2024-06-17 DIAGNOSIS — E782 Mixed hyperlipidemia: Secondary | ICD-10-CM

## 2024-06-17 LAB — CBC WITH DIFFERENTIAL/PLATELET
Basophils Absolute: 0.1 10*3/uL (ref 0.0–0.2)
Basos: 1 %
EOS (ABSOLUTE): 0.2 10*3/uL (ref 0.0–0.4)
Eos: 2 %
Hematocrit: 50.7 % (ref 37.5–51.0)
Hemoglobin: 17.4 g/dL (ref 13.0–17.7)
Immature Grans (Abs): 0 10*3/uL (ref 0.0–0.1)
Immature Granulocytes: 0 %
Lymphocytes Absolute: 1.3 10*3/uL (ref 0.7–3.1)
Lymphs: 16 %
MCH: 34.6 pg — ABNORMAL HIGH (ref 26.6–33.0)
MCHC: 34.3 g/dL (ref 31.5–35.7)
MCV: 101 fL — ABNORMAL HIGH (ref 79–97)
Monocytes Absolute: 0.9 10*3/uL (ref 0.1–0.9)
Monocytes: 10 %
Neutrophils Absolute: 6.1 10*3/uL (ref 1.4–7.0)
Neutrophils: 71 %
Platelets: 254 10*3/uL (ref 150–450)
RBC: 5.03 x10E6/uL (ref 4.14–5.80)
RDW: 12.4 % (ref 11.6–15.4)
WBC: 8.5 10*3/uL (ref 3.4–10.8)

## 2024-06-17 LAB — CMP14+EGFR
ALT: 28 [IU]/L (ref 0–44)
AST: 25 [IU]/L (ref 0–40)
Albumin: 4.7 g/dL (ref 3.9–4.9)
Alkaline Phosphatase: 82 [IU]/L (ref 47–123)
BUN/Creatinine Ratio: 13 (ref 10–24)
BUN: 15 mg/dL (ref 8–27)
Bilirubin Total: 1 mg/dL (ref 0.0–1.2)
CO2: 21 mmol/L (ref 20–29)
Calcium: 10.1 mg/dL (ref 8.6–10.2)
Chloride: 93 mmol/L — ABNORMAL LOW (ref 96–106)
Creatinine, Ser: 1.17 mg/dL (ref 0.76–1.27)
Globulin, Total: 3.3 g/dL (ref 1.5–4.5)
Glucose: 131 mg/dL — ABNORMAL HIGH (ref 70–99)
Potassium: 4.2 mmol/L (ref 3.5–5.2)
Sodium: 132 mmol/L — ABNORMAL LOW (ref 134–144)
Total Protein: 8 g/dL (ref 6.0–8.5)
eGFR: 69 mL/min/{1.73_m2}

## 2024-06-17 LAB — LIPID PANEL
Chol/HDL Ratio: 1.9 ratio (ref 0.0–5.0)
Cholesterol, Total: 113 mg/dL (ref 100–199)
HDL: 58 mg/dL
LDL Chol Calc (NIH): 39 mg/dL (ref 0–99)
Triglycerides: 78 mg/dL (ref 0–149)
VLDL Cholesterol Cal: 16 mg/dL (ref 5–40)

## 2024-06-17 LAB — BAYER DCA HB A1C WAIVED: HB A1C (BAYER DCA - WAIVED): 5.6 % (ref 4.8–5.6)

## 2024-06-17 NOTE — Progress Notes (Signed)
 "  Subjective:  Patient ID: Frank Rosales, male    DOB: [birth year]  Age: 69 y.o. MRN: [id number]  CC: Medical Management of Chronic Issues   HPI  Discussed the use of AI scribe software for clinical note transcription with the patient, who gave verbal consent to proceed.  History of Present Illness Frank Rosales is a 69 year old male with diabetes and COPD who presents for a routine follow-up visit.  No episodes of hypoglycemia and he is not on any injectable medications for diabetes.  He has a history of COPD and describes his breathing as 'okay.' His blood oxygen levels are typically around 91-92% but can drop when walking from the parking lot. He uses Breztri  inhaler twice daily and albuterol  as needed, particularly before walks, which he finds helpful. He occasionally monitors his oxygen levels with a pulse oximeter during activities.  He manages eczema with Dupixent  injections, which he reports are effective.  He takes atorvastatin  at night for cholesterol management and supplements with vitamin B12 and vitamin D  daily.  [place] are less prone to icing compared to paved roads. He has access to a four-wheel drive vehicle, which he uses to navigate snowy conditions.          12/04/2023    2:01 PM 03/21/2023    9:43 AM 12/12/2022   10:57 AM  Depression screen PHQ 2/9  Decreased Interest 0 0 0  Down, Depressed, Hopeless 0 0 0  PHQ - 2 Score 0 0 0  Altered sleeping 0    Tired, decreased energy 3    Change in appetite 0    Feeling bad or failure about yourself  0    Trouble concentrating 0    Moving slowly or fidgety/restless 0    Suicidal thoughts 0    PHQ-9 Score 3     Difficult doing work/chores Not difficult at all       Data saved with a previous flowsheet row definition    History Finnegan has a past medical history of Anxiety, Arthritis, Asthma, Atypical nevus (03/27/2011), Atypical nevus (01/20/2007), Cataract, Colon  polyps, COPD (chronic obstructive pulmonary disease) (HCC), Diabetes mellitus without complication (HCC), Emphysema of lung (HCC), GERD (gastroesophageal reflux disease), High blood pressure, High cholesterol, Hilar adenopathy, Hypothyroidism, Multiple rib fractures (08/2016), Nodular basal cell carcinoma (BCC) (02/18/2020), Nodular basal cell carcinoma (BCC) (07/04/2021), Nodular basal cell carcinoma (BCC) (07/04/2021), Osteoporosis, SCCA (squamous cell carcinoma) of skin (02/18/2020), SCCA (squamous cell carcinoma) of skin (02/18/2020), SCCA (squamous cell carcinoma) of skin (02/18/2020), Squamous cell carcinoma in situ (SCCIS) (01/09/2016), Substance abuse (HCC), and Superficial nodular basal cell carcinoma (BCC) (01/09/2016).   He has a past surgical history that includes Knee arthroscopy (Right, 05/14/2010); Wisdom tooth extraction; epidural injections; Fixation kyphoplasty lumbar spine; Video assisted thoracoscopy (Left, 08/22/2016); Pleural effusion drainage (Left, 08/22/2016); Rib plating (Left, 08/22/2016); LEFT HEART CATH AND CORONARY ANGIOGRAPHY (N/A, 08/22/2022); Colonoscopy with propofol  (N/A, 08/01/2023); and polypectomy (08/01/2023).   His family history includes Diabetes in his mother; Emphysema in his father; Esophageal cancer in his maternal uncle; Heart disease in his brother; Heart failure in his mother; Lung cancer in his father.He reports that he quit smoking about 6 years ago. His smoking use included cigarettes. He started smoking about 51 years ago. He has a 67.5 pack-year smoking history. He has never been exposed to tobacco smoke. He has never used smokeless tobacco. He reports current alcohol use of about 18.0 standard  drinks of alcohol per week. He reports that he does not use drugs.    ROS Review of Systems  Constitutional: Negative.   HENT: Negative.    Eyes:  Negative for visual disturbance.  Respiratory:  Negative for cough and shortness of breath.   Cardiovascular:   Negative for chest pain and leg swelling.  Gastrointestinal:  Negative for abdominal pain, diarrhea, nausea and vomiting.  Genitourinary:  Negative for difficulty urinating.  Musculoskeletal:  Negative for arthralgias and myalgias.  Skin:  Negative for rash.  Neurological:  Negative for headaches.  Psychiatric/Behavioral:  Negative for sleep disturbance.     Objective:  BP 120/79   Pulse 71   Temp 98.1 F (36.7 C)   Ht 5' 8 (1.727 m)   Wt 202 lb (91.6 kg)   SpO2 93%   BMI 30.71 kg/m   BP Readings from Last 3 Encounters:  06/17/24 120/79  04/27/24 107/71  03/17/24 120/74    Wt Readings from Last 3 Encounters:  06/17/24 202 lb (91.6 kg)  04/27/24 204 lb (92.5 kg)  03/17/24 210 lb (95.3 kg)     Physical Exam Physical Exam GENERAL: Alert, cooperative, well developed, no acute distress. HEENT: Normocephalic, normal oropharynx, moist mucous membranes. CHEST: Clear to auscultation bilaterally, no wheezes, rhonchi, or crackles. CARDIOVASCULAR: Normal heart rate and rhythm, S1 and S2 normal without murmurs. ABDOMEN: Soft, non-tender, non-distended, without organomegaly, normal bowel sounds. EXTREMITIES: No cyanosis or edema. NEUROLOGICAL: Cranial nerves grossly intact, moves all extremities without gross motor or sensory deficit.   Assessment & Plan:  Controlled type 2 diabetes mellitus without complication, without long-term current use of insulin (HCC) -     Bayer DCA Hb A1c Waived -     CBC with Differential/Platelet -     CMP14+EGFR  Mixed hyperlipidemia -     Lipid panel -     Basic metabolic panel with GFR -     RFE85+ZHQM  Immunization due -     Pneumococcal conjugate vaccine 20-valent  COPD GOLD III     Assessment and Plan Assessment & Plan Type 2 diabetes mellitus   His diabetes is well-controlled with the current medication regimen, with no reported episodes of hypoglycemia. Continue Metformin  and Farxiga  as prescribed.  Chronic obstructive  pulmonary disease   Oxygen saturation drops to 91-93% with exertion but normalizes with rest. Continue Breztri  two puffs twice daily. Use albuterol  inhaler 15 minutes before walking as needed. Consider using oxygen if exertional desaturation causes symptoms.  Mixed hyperlipidemia   His condition is managed with atorvastatin . Continue atorvastatin  as prescribed.  Eczema   Eczema is well-controlled with Dupixent  injections. Continue Dupixent  injections as prescribed.  Essential hypertension   Blood pressure is well-controlled with the current medication regimen. Continue current antihypertensive medications.  General health maintenance   Vitamin B12 and vitamin D  supplementation are ongoing. Continue daily vitamin B12 and vitamin D  supplementation.       Follow-up: Return in about 3 months (around 09/14/2024).  Butler Der, M.D. "

## 2024-06-18 LAB — OPHTHALMOLOGY REPORT-SCANNED

## 2024-09-01 ENCOUNTER — Ambulatory Visit: Admitting: Internal Medicine

## 2024-09-21 ENCOUNTER — Ambulatory Visit: Admitting: Family Medicine

## 2024-12-07 ENCOUNTER — Ambulatory Visit: Payer: Self-pay
